# Patient Record
Sex: Female | Born: 1950 | Race: Black or African American | Hispanic: No | Marital: Single | Smoking: Never smoker
Health system: Southern US, Community
[De-identification: ages and names within clinical notes are randomized; demographics above are authoritative.]

## PROBLEM LIST (undated history)

## (undated) DIAGNOSIS — N189 Chronic kidney disease, unspecified: Secondary | ICD-10-CM

## (undated) DIAGNOSIS — T8859XA Other complications of anesthesia, initial encounter: Secondary | ICD-10-CM

## (undated) DIAGNOSIS — E119 Type 2 diabetes mellitus without complications: Secondary | ICD-10-CM

## (undated) DIAGNOSIS — I739 Peripheral vascular disease, unspecified: Secondary | ICD-10-CM

## (undated) DIAGNOSIS — E785 Hyperlipidemia, unspecified: Secondary | ICD-10-CM

## (undated) DIAGNOSIS — M1990 Unspecified osteoarthritis, unspecified site: Secondary | ICD-10-CM

## (undated) DIAGNOSIS — I484 Atypical atrial flutter: Secondary | ICD-10-CM

## (undated) DIAGNOSIS — I10 Essential (primary) hypertension: Secondary | ICD-10-CM

## (undated) HISTORY — PX: AV FISTULA PLACEMENT: SHX1204

## (undated) HISTORY — PX: LEG AMPUTATION ABOVE KNEE: SHX117

## (undated) HISTORY — DX: Atypical atrial flutter: I48.4

## (undated) HISTORY — PX: BELOW KNEE LEG AMPUTATION: SUR23

## (undated) HISTORY — PX: ABOVE KNEE LEG AMPUTATION: SUR20

## (undated) HISTORY — PX: BREAST BIOPSY: SHX20

## (undated) SURGERY — Surgical Case
Anesthesia: *Unknown

---

## 2002-03-04 DIAGNOSIS — E119 Type 2 diabetes mellitus without complications: Secondary | ICD-10-CM | POA: Insufficient documentation

## 2002-03-04 DIAGNOSIS — N186 End stage renal disease: Secondary | ICD-10-CM | POA: Insufficient documentation

## 2008-06-16 ENCOUNTER — Ambulatory Visit: Payer: Self-pay | Admitting: Nephrology

## 2009-05-20 ENCOUNTER — Ambulatory Visit: Payer: Self-pay | Admitting: Ophthalmology

## 2009-05-26 ENCOUNTER — Ambulatory Visit: Payer: Self-pay | Admitting: Ophthalmology

## 2009-05-31 ENCOUNTER — Ambulatory Visit: Payer: Self-pay | Admitting: Ophthalmology

## 2009-07-19 ENCOUNTER — Ambulatory Visit: Payer: Self-pay | Admitting: Internal Medicine

## 2009-08-09 ENCOUNTER — Ambulatory Visit: Payer: Self-pay | Admitting: Internal Medicine

## 2010-06-15 ENCOUNTER — Inpatient Hospital Stay: Payer: Self-pay | Admitting: Internal Medicine

## 2010-08-09 DIAGNOSIS — I214 Non-ST elevation (NSTEMI) myocardial infarction: Secondary | ICD-10-CM | POA: Insufficient documentation

## 2011-03-30 DIAGNOSIS — E6609 Other obesity due to excess calories: Secondary | ICD-10-CM | POA: Insufficient documentation

## 2011-07-26 ENCOUNTER — Emergency Department: Payer: Self-pay | Admitting: Unknown Physician Specialty

## 2011-07-26 LAB — COMPREHENSIVE METABOLIC PANEL
Alkaline Phosphatase: 124 U/L (ref 50–136)
Anion Gap: 13 (ref 7–16)
Bilirubin,Total: 0.4 mg/dL (ref 0.2–1.0)
Calcium, Total: 9.1 mg/dL (ref 8.5–10.1)
Co2: 27 mmol/L (ref 21–32)
Creatinine: 4.54 mg/dL — ABNORMAL HIGH (ref 0.60–1.30)
EGFR (Non-African Amer.): 10 — ABNORMAL LOW
Glucose: 191 mg/dL — ABNORMAL HIGH (ref 65–99)
Osmolality: 289 (ref 275–301)
SGOT(AST): 17 U/L (ref 15–37)
SGPT (ALT): 11 U/L — ABNORMAL LOW

## 2011-07-26 LAB — CBC
HCT: 42.3 % (ref 35.0–47.0)
MCH: 24.6 pg — ABNORMAL LOW (ref 26.0–34.0)
MCV: 78 fL — ABNORMAL LOW (ref 80–100)
Platelet: 201 10*3/uL (ref 150–440)
RBC: 5.46 10*6/uL — ABNORMAL HIGH (ref 3.80–5.20)
WBC: 10.8 10*3/uL (ref 3.6–11.0)

## 2011-07-26 LAB — LIPASE, BLOOD: Lipase: 129 U/L (ref 73–393)

## 2011-07-26 LAB — PROTIME-INR
INR: 1
Prothrombin Time: 13.4 secs (ref 11.5–14.7)

## 2011-12-28 ENCOUNTER — Emergency Department: Payer: Self-pay | Admitting: *Deleted

## 2011-12-28 LAB — URINALYSIS, COMPLETE
Bilirubin,UR: NEGATIVE
Glucose,UR: >=500 mg/dL (ref 0–75)
Protein: 100
RBC,UR: 77 /HPF (ref 0–5)
Specific Gravity: 1.01 (ref 1.003–1.030)
Squamous Epithelial: NONE SEEN
WBC UR: 1111 /HPF (ref 0–5)

## 2011-12-28 LAB — COMPREHENSIVE METABOLIC PANEL
Albumin: 2.7 g/dL — ABNORMAL LOW (ref 3.4–5.0)
Anion Gap: 10 (ref 7–16)
BUN: 28 mg/dL — ABNORMAL HIGH (ref 7–18)
Bilirubin,Total: 0.3 mg/dL (ref 0.2–1.0)
Calcium, Total: 8.9 mg/dL (ref 8.5–10.1)
Chloride: 101 mmol/L (ref 98–107)
Co2: 30 mmol/L (ref 21–32)
EGFR (African American): 9 — ABNORMAL LOW
EGFR (Non-African Amer.): 8 — ABNORMAL LOW
Glucose: 316 mg/dL — ABNORMAL HIGH (ref 65–99)
Osmolality: 299 (ref 275–301)
Potassium: 4.4 mmol/L (ref 3.5–5.1)
SGPT (ALT): 7 U/L — ABNORMAL LOW
Sodium: 141 mmol/L (ref 136–145)
Total Protein: 6.9 g/dL (ref 6.4–8.2)

## 2011-12-28 LAB — CBC
HCT: 33.7 % — ABNORMAL LOW (ref 35.0–47.0)
MCH: 24.6 pg — ABNORMAL LOW (ref 26.0–34.0)
MCV: 79 fL — ABNORMAL LOW (ref 80–100)
Platelet: 267 10*3/uL (ref 150–440)
RBC: 4.24 10*6/uL (ref 3.80–5.20)
RDW: 16.3 % — ABNORMAL HIGH (ref 11.5–14.5)

## 2011-12-28 LAB — CK TOTAL AND CKMB (NOT AT ARMC): CK, Total: 59 U/L (ref 21–215)

## 2012-04-19 ENCOUNTER — Other Ambulatory Visit: Payer: Self-pay

## 2012-04-19 LAB — POTASSIUM: Potassium: 5.9 mmol/L — ABNORMAL HIGH (ref 3.5–5.1)

## 2012-04-22 ENCOUNTER — Other Ambulatory Visit: Payer: Self-pay

## 2012-04-22 LAB — POTASSIUM: Potassium: 6.2 mmol/L — ABNORMAL HIGH (ref 3.5–5.1)

## 2012-04-24 ENCOUNTER — Other Ambulatory Visit: Payer: Self-pay

## 2012-04-24 LAB — POTASSIUM: Potassium: 5.4 mmol/L — ABNORMAL HIGH (ref 3.5–5.1)

## 2012-06-05 ENCOUNTER — Other Ambulatory Visit: Payer: Self-pay

## 2012-09-02 ENCOUNTER — Other Ambulatory Visit: Payer: Self-pay

## 2012-09-13 ENCOUNTER — Ambulatory Visit: Payer: Self-pay | Admitting: Vascular Surgery

## 2012-09-24 ENCOUNTER — Ambulatory Visit: Payer: Self-pay | Admitting: Vascular Surgery

## 2012-09-24 LAB — BASIC METABOLIC PANEL
Anion Gap: 10 (ref 7–16)
Calcium, Total: 8.3 mg/dL — ABNORMAL LOW (ref 8.5–10.1)
Co2: 29 mmol/L (ref 21–32)
Creatinine: 6.84 mg/dL — ABNORMAL HIGH (ref 0.60–1.30)
EGFR (African American): 7 — ABNORMAL LOW
Glucose: 164 mg/dL — ABNORMAL HIGH (ref 65–99)
Osmolality: 287 (ref 275–301)
Potassium: 4.2 mmol/L (ref 3.5–5.1)
Sodium: 136 mmol/L (ref 136–145)

## 2012-09-24 LAB — CBC
HCT: 32.3 % — ABNORMAL LOW (ref 35.0–47.0)
MCH: 26.3 pg (ref 26.0–34.0)
MCV: 80 fL (ref 80–100)
Platelet: 268 10*3/uL (ref 150–440)
RDW: 14.7 % — ABNORMAL HIGH (ref 11.5–14.5)
WBC: 9.5 10*3/uL (ref 3.6–11.0)

## 2012-09-27 ENCOUNTER — Ambulatory Visit: Payer: Self-pay | Admitting: Vascular Surgery

## 2012-10-29 ENCOUNTER — Ambulatory Visit: Payer: Self-pay | Admitting: Vascular Surgery

## 2012-10-29 LAB — BASIC METABOLIC PANEL
Calcium, Total: 8.3 mg/dL — ABNORMAL LOW (ref 8.5–10.1)
Creatinine: 6.67 mg/dL — ABNORMAL HIGH (ref 0.60–1.30)
EGFR (Non-African Amer.): 6 — ABNORMAL LOW
Osmolality: 287 (ref 275–301)
Sodium: 137 mmol/L (ref 136–145)

## 2012-12-03 ENCOUNTER — Ambulatory Visit: Payer: Self-pay | Admitting: Vascular Surgery

## 2012-12-03 LAB — CBC
HCT: 38 % (ref 35.0–47.0)
MCH: 26.4 pg (ref 26.0–34.0)
MCHC: 32.7 g/dL (ref 32.0–36.0)
MCV: 81 fL (ref 80–100)
Platelet: 193 10*3/uL (ref 150–440)
WBC: 8.4 10*3/uL (ref 3.6–11.0)

## 2012-12-03 LAB — BASIC METABOLIC PANEL
Anion Gap: 10 (ref 7–16)
BUN: 37 mg/dL — ABNORMAL HIGH (ref 7–18)
Calcium, Total: 8.4 mg/dL — ABNORMAL LOW (ref 8.5–10.1)
EGFR (African American): 7 — ABNORMAL LOW
EGFR (Non-African Amer.): 6 — ABNORMAL LOW
Potassium: 4.5 mmol/L (ref 3.5–5.1)
Sodium: 138 mmol/L (ref 136–145)

## 2012-12-13 ENCOUNTER — Ambulatory Visit: Payer: Self-pay | Admitting: Vascular Surgery

## 2013-09-22 DIAGNOSIS — E441 Mild protein-calorie malnutrition: Secondary | ICD-10-CM | POA: Insufficient documentation

## 2014-02-10 ENCOUNTER — Ambulatory Visit: Payer: Self-pay | Admitting: Family

## 2014-03-05 DIAGNOSIS — T879 Unspecified complications of amputation stump: Secondary | ICD-10-CM | POA: Insufficient documentation

## 2014-10-09 NOTE — Op Note (Signed)
PATIENT NAME:  Amy Wu, Amy Wu MR#:  [id number] DATE OF BIRTH:  [birth year]  DATE OF PROCEDURE:  09/13/2012  PREOPERATIVE DIAGNOSES:  1. Complication of arteriovenous dialysis device with thrombosis of left arm arteriovenous graft.  2. Superior vena cava syndrome.  3. End-stage renal disease, requiring hemodialysis.  4. Lack of venous access.  5. Diabetes.   POSTOPERATIVE DIAGNOSES:  1. Complication of arteriovenous dialysis device with thrombosis of left arm arteriovenous graft.  2. Superior vena cava syndrome.  3. End-stage renal disease, requiring hemodialysis.  4. Lack of venous access.  5. Diabetes.   PROCEDURES PERFORMED:  1. Introduction catheter into superior vena cava with jugular and central venography.  2. Insertion of left internal jugular cuffed tunneled dialysis catheter, Cannon type.   3. Ultrasound-guided insertion of intravenous access, right arm basilic vein.   PROCEDURE PERFORMED BY: Katha Cabal, MD   SEDATION: Versed 4 mg plus fentanyl 150 mcg administered IV. Continuous ECG, pulse oximetry and cardiopulmonary monitoring is performed throughout the entire procedure by the interventional radiology nurse. Total sedation time was 1 hour 20 minutes.   ACCESS: Left IJ.   FLUOROSCOPY TIME: 2.6 minutes.   CONTRAST USED: Isovue 5 mL.   INDICATIONS: The patient is a 64 year old woman maintained on hemodialysis, who has had difficulties throughout the week. Monday and Wednesday, dialysis was incomplete and fraught with problems. Today, she presented with thrombosis of her graft. She has significant elevation of her potassium and is in need of dialysis as she has technically missed several sessions, and therefore she is undergoing evaluation for catheter placement so that she can receive her dialysis, and her upper extremity access will be evaluated at a later date.   In addition, she is found to have significant lack of accessible veins for an IV, and ultimately, I am  asked to place a right arm venous access so that she can receive her fluids as well as her sedation. Risks and benefits were reviewed. All questions have been answered. The patient has agreed to proceed.   PROCEDURE: The patient is taken to special procedures and placed in the supine position, and her right arm is extended palm up. Her right arm is prepped, and then ultrasound is placed in a sterile sleeve. Ultrasound is utilized secondary to lack of appropriate landmarks and to avoid vascular injury. Scanning of the upper arm demonstrates a basilic vein which is fairly small and sclerotic in segments, but it is compressible and therefore patent. Under direct ultrasound visualization, after image is recorded, a micropuncture needle is inserted into the basilic vein. A microwire is advanced and followed by micro sheath. Micro sheath aspirates and flushes easily and is therefore connected to the IV fluids. The patient is then given conscious sedation as described above.   The left neck and chest wall are then prepped and draped in a sterile fashion. Ultrasound is placed in a new sterile sleeve. Jugular vein is identified. Micropuncture kit is utilized. Jugular vein is compressible and homogeneous, indicating patency. Image is recorded, and the micropuncture needle is utilized to access the vein. Microwire is then advanced; however, it curls in the more proximal jugular vein, suggesting a stenosis. Ultimately, the micro sheath is inserted, and hand injection of contrast is then utilized, demonstrating a moderate stenosis of the jugular vein at the confluence with the subclavian. Given the involvement of the subclavian, I am highly suspicious the reason her access has been failing is secondary to a central venous stenosis.   Having  identified the stenosis, the 0.018 wire is then negotiated beyond the stenosis and into the superior vena cava. Micro sheath is advanced completely. Subsequently, the wire and dilator  are removed, and an Amplatz Super Stiff wire is inserted. A 6 French sheath is inserted over the Amplatz wire, and KMP catheter is advanced. KMP catheter is used to interrogate the superior vena cava to ensure it is patent, which is confirmed, and subsequently, the KMP catheter is used to negotiate the Amplatz Super Stiff wire into the inferior vena cava.   Counterincision is made at the wire insertion site, a small pocket created with a hemostat, and then serial dilatation is performed. Dilator and peel-away sheath are advanced over the wire. A 23 cm tip to cuff Cannon catheter is selected. It is woven over the wire after the dilator is removed and advanced through the peel-away sheath under fluoroscopy. The peel-away sheath is removed. Wire is then removed. The catheter is approximated to the chest wall. Exit site is selected, anesthetized with 1% lidocaine. A small incision is created. Tunneling device is passed subcutaneously, and the tract is dilated. The catheter is then pulled subcutaneously and positioned with its tips in the mid atrium.   Catheter is transected, hub assembly is connected without difficulty. Both lumens aspirate and flush easily. They are then individually packed with 5000 units of heparin. The catheter is checked for smooth contour under fluoroscopy. Tips are in good position, and it is then secured to the chest wall with 0 silk suture. Neck counterincision is closed with 4-0 Monocryl subcuticular and then Dermabond. Sterile dressing is applied. The patient tolerated the procedure well, and there were no immediate complications. Sponge and needle counts were correct. She is taken to the recovery area in excellent condition.      ____________________________ Katha Cabal, MD ggs:OSi D: 09/13/2012 17:05:20 ET T: 09/14/2012 08:43:18 ET JOB#: [id number]  cc: Katha Cabal, MD, <Dictator> [facility] Katha Cabal MD ELECTRONICALLY SIGNED 09/17/2012 17:22

## 2014-10-09 NOTE — Op Note (Signed)
PATIENT NAME:  Amy Wu, Amy Wu MR#:  [id number] DATE OF BIRTH:  05-06-1951  DATE OF PROCEDURE:  12/13/2012  PREOPERATIVE DIAGNOSES:  1.  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  2.  Complication, arteriovenous graft.  3.  End-stage renal disease, requiring hemodialysis.   POSTOPERATIVE DIAGNOSES:  1.  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  2.  Complication, arteriovenous graft.  3.  End-stage renal disease, requiring hemodialysis.   PROCEDURE PERFORMED:  Ligation of right arm brachial axillary dialysis graft.   PROCEDURE PERFORMED BY:  Hortencia Pilar, MD   ANESTHESIA:  MAC.   ESTIMATED BLOOD LOSS:  Minimal.   SPECIMEN:  None.   INDICATIONS:  [name] is a 64 year old woman who presents with increasing pain in her right hand. She is status post successful AV graft placement. However, when given the option of a drill procedure, she has refused, and wishes to maintain a catheter as her sole mode of dialysis. The risks and benefits were reviewed, all questions answered, and the patient is therefore undergoing ligation to eliminate steal.   DESCRIPTION OF PROCEDURE:  The patient is taken to the operating room and placed in supine position. After adequate sedation is achieved, the right arm is prepped and draped in sterile fashion. Previous incisional scar is infiltrated with 0.5% Marcaine, and subsequently the incision is reopened with a 15 blade scalpel. The dissection is carried down, graft is exposed, dissected circumferentially quite easily, and then it is ligated with 3-0 silk ties. Wound is irrigated and then closed in layers with interrupted 3-0 Vicryl followed by 4-0 Monocryl subcuticular and Dermabond. The patient tolerated the procedure well, and there were no immediate complications. Sponge and needle counts were correct, and she is taken to the recovery area in excellent condition.     ____________________________ Katha Cabal,  MD ggs:mr D: 12/14/2012 10:35:48 ET T: 12/14/2012 18:51:45 ET JOB#: [id number]  cc: Katha Cabal, MD, <Dictator> Talbert Cage, [place] MD ELECTRONICALLY SIGNED 12/24/2012 14:29

## 2014-10-09 NOTE — Op Note (Signed)
PATIENT NAME:  Amy Wu, Amy Wu MR#:  [id number] DATE OF BIRTH:  [birth year]  DATE OF PROCEDURE:  10/29/2012  PREOPERATIVE DIAGNOSES: 1.  Ischemia, right hand.  2.  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  3.  End-stage renal disease requiring hemodialysis.   POSTOPERATIVE DIAGNOSES: 1.  Ischemia, right hand.  2.  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  3.  End-stage renal disease requiring hemodialysis.   PROCEDURES PERFORMED: 1.  Arch aortogram.  2.  Right upper extremity angiography, third order catheter placement.   SURGEON: Katha Cabal, M.D.   SEDATION: Versed 3 mg plus fentanyl 100 mcg administered IV. Continuous ECG, pulse oximetry and cardiopulmonary monitoring is performed throughout the entire procedure by the interventional radiology nurse. Total sedation time was 45 minutes.   ACCESS: 5-French sheath, left common femoral artery.   CONTRAST USED: Isovue 50 mL.     FLUOROSCOPY TIME: 2.2 minutes.   INDICATIONS: Ms. Hannasch is a 64 year old woman who is having increasing pain and loss of function in her hand. She notes this began after she had a right arm brachioaxillary dialysis graft placed. Duplex ultrasound suggested stricture stenosis of the brachial artery at the level of the anastomosis and she is undergoing angiography with the hope for intervention. The risks and benefits were reviewed. All questions were answered. The patient agrees to proceed.   DESCRIPTION OF PROCEDURE: The patient is taken to the special procedures suite and placed in the supine position. After adequate sedation is achieved, the groins are prepped and draped in sterile fashion. Ultrasound is placed in a sterile sleeve. Ultrasound is utilized secondary to lack of appropriate landmarks and to avoid vascular injury. Under direct ultrasound visualization, access is obtained to the common femoral artery under real-time visualization. Image is recorded for the  permanent record. Common femoral artery is homogeneous and pulsatile, indicating patency. Microwire followed by micro sheath, J-wire followed by 5-French sheath and 5-French pigtail catheter are then inserted. The pigtail catheter is positioned in the descending aorta and an LAO projection of the aortic arch is obtained. A stiff angled Glidewire and H1 catheter are exchanged for the pigtail catheter and the catheter is negotiated into the first  innominate and then the subclavian. Distal runoff is then obtained, initially  injecting from the catheter in the subclavian position and then reintroducing the wire and advancing the catheter into the mid brachial artery. After review of the images, the catheter is removed over wire and an oblique view of the left groin is obtained and a  Mynx  device is deployed for hemostasis. There are no immediate complications.   INTERPRETATION: The aortic arch is opacified with a bolus injection of contrast. There are no significant atherosclerotic changes noted or hemodynamically significant stenoses. The great vessels are widely patent at their ostia. It is a type II arch. The innominate, subclavian, axillary and brachial arteries are widely patent. The brachial artery at the level of the  AV anastomosis is widely patent with no irregularity whatsoever and the anastomosis itself is widely patent. Graft appears widely patent and fills the venous system without difficulty. There is no evidence of venous stenosis. The distal brachial artery also fills with injection from the midportion; however, as contrast enters the radial artery, diffuse disease is noted and significant reduction in the speed of flow. There is diffuse noncritical disease throughout. There is incomplete palmar arch noted. The interosseous artery is of moderate size, but again does not contribute significant collaterals distal to  the wrist. The ulnar artery is occluded throughout its entire course.   SUMMARY: The  patient appears to have rather profound distal disease at the small vessel level. This is not amenable to intervention. There does not appear to be any stricture stenosis surrounding the anastomotic area or the brachial artery itself and the  AV graft appears to be widely patent. In summary, the patient would likely benefit from a DRIL, although ligation of the AV graft will also be discussed.   ____________________________ Katha Cabal, MD ggs:jm D: 10/30/2012 17:55:55 ET T: 10/30/2012 20:25:44 ET JOB#: [id number]  cc: Katha Cabal, MD, <Dictator> Katha Cabal MD ELECTRONICALLY SIGNED 11/05/2012 9:28

## 2014-10-09 NOTE — Op Note (Signed)
PATIENT NAME:  Amy Wu, Amy Wu MR#:  [id number] DATE OF BIRTH:  Apr 04, 1951  DATE OF PROCEDURE:  09/27/2012  PREOPERATIVE DIAGNOSES: 1.  End-stage renal disease requiring hemodialysis.  2.  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  3.  Peripheral vascular disease, status post bilateral amputations.   POSTOPERATIVE DIAGNOSES: 1.  End-stage renal disease requiring hemodialysis.  2.  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  3.  Peripheral vascular disease, status post bilateral amputations.   PROCEDURE PERFORMED:  Creation of a right arm brachial axillary dialysis graft.   SURGEON:  Katha Cabal, M.D.   ANESTHESIA:  General by endotracheal intubation.   FLUIDS:  Per Anesthesia record.   ESTIMATED BLOOD LOSS:  Minimal.   SPECIMEN:  None.   INDICATIONS:  The patient is a 64 year old woman with multiple medical problems who is maintained on hemodialysis. She has been catheter-dependent and presented to the office for evaluation for upper extremity access. The risks and benefits were reviewed. All questions answered. The patient has agreed to proceed with creation of a right arm brachial axillary dialysis graft.   DESCRIPTION OF PROCEDURE:  The patient is taken to the Operating Room and placed in the supine position. After adequate general anesthesia is induced and appropriate invasive monitors are placed, she is positioned supine with her right arm extended palm upward. The right arm is then prepped and draped in a circumferential fashion. Appropriate time-out is called.   A linear incision is then created through previous scar of the right arm just above the antecubital fossa, and the dissection is carried down to identify the brachial veins. The brachial vein is dissected circumferentially and reflected superiorly exposing the brachial artery. The brachial artery is then dissected circumferentially and looped proximally and distally  with Silastic vessel loops.   In a similar fashion a linear incision is made along the anterior axillary line at the upper arm, and the dissection is carried down through the soft tissues to expose the axillary vein which is then looped proximally and distally with Silastic vessel loops.   Atrium tunneler is then used to create a tunnel subcutaneously and a 4 to 7 mm tapered Propaten graft is pulled through the tunnel.   The brachial artery is then controlled proximally and distally. Arteriotomy is made, and stay sutures of 6-0 Prolene are placed. The graft is beveled to an appropriate angle and an end graft to side brachial artery anastomosis is fashioned using running CV-6 suture. Flushing maneuvers are performed and flow was established back to the hand. Palpable radial pulse is noted. The graft is then flushed after it is pressurized to ensure that it is in good position and clamped just above the suture line. With the axillary vein in its native bed, the graft is approximated and then transected.   The vein is then controlled proximally and distally with the Silastic vessel loops. A venotomy is created, extended with Potts scissors, 6-0 Prolene stay sutures are placed, and an end graft to side vein anastomosis is fashioned with running CV-6 suture. Flushing maneuvers are performed and flow is established through the graft. Excellent thrill is noted. Radial pulse is maintained.   Both wounds are then irrigated with sterile saline and then closed in multiple layers using 3-0 Vicryl followed by 4-0 Monocryl subcuticular and then Dermabond. The patient tolerated the procedure well. There were no immediate complications. Sponge and needle counts are correct, and she is taken  to the recovery area in excellent condition.   ____________________________ Katha Cabal, MD ggs:jm D: 09/27/2012 18:03:17 ET T: 09/28/2012 12:18:32 ET JOB#: [id number]  cc: Katha Cabal, MD, <Dictator> Danelle Berry.  Derrek Monaco, [place] MD ELECTRONICALLY SIGNED 10/28/2012 13:44

## 2015-02-04 ENCOUNTER — Encounter: Admission: RE | Payer: Self-pay | Source: Ambulatory Visit

## 2015-02-04 ENCOUNTER — Ambulatory Visit
Admission: RE | Admit: 2015-02-04 | Payer: Medicare (Managed Care) | Source: Ambulatory Visit | Admitting: Vascular Surgery

## 2015-02-04 SURGERY — DIALYSIS/PERMA CATHETER INSERTION
Anesthesia: Moderate Sedation

## 2015-02-11 ENCOUNTER — Ambulatory Visit
Admission: RE | Admit: 2015-02-11 | Discharge: 2015-02-11 | Disposition: A | Discharge status: Home / Self Care | Payer: Medicare (Managed Care) | Source: Ambulatory Visit | Attending: Vascular Surgery | Admitting: Vascular Surgery

## 2015-02-11 ENCOUNTER — Encounter
Admission: RE | Disposition: A | Discharge status: Home / Self Care | Payer: Self-pay | Source: Ambulatory Visit | Attending: Vascular Surgery

## 2015-02-11 DIAGNOSIS — Z89611 Acquired absence of right leg above knee: Secondary | ICD-10-CM | POA: Insufficient documentation

## 2015-02-11 DIAGNOSIS — I739 Peripheral vascular disease, unspecified: Secondary | ICD-10-CM | POA: Insufficient documentation

## 2015-02-11 DIAGNOSIS — N186 End stage renal disease: Secondary | ICD-10-CM | POA: Insufficient documentation

## 2015-02-11 DIAGNOSIS — E785 Hyperlipidemia, unspecified: Secondary | ICD-10-CM | POA: Diagnosis not present

## 2015-02-11 DIAGNOSIS — Z992 Dependence on renal dialysis: Secondary | ICD-10-CM | POA: Diagnosis not present

## 2015-02-11 DIAGNOSIS — I120 Hypertensive chronic kidney disease with stage 5 chronic kidney disease or end stage renal disease: Secondary | ICD-10-CM | POA: Insufficient documentation

## 2015-02-11 DIAGNOSIS — E119 Type 2 diabetes mellitus without complications: Secondary | ICD-10-CM | POA: Diagnosis not present

## 2015-02-11 DIAGNOSIS — Z89512 Acquired absence of left leg below knee: Secondary | ICD-10-CM | POA: Diagnosis not present

## 2015-02-11 HISTORY — PX: PERIPHERAL VASCULAR CATHETERIZATION: SHX172C

## 2015-02-11 HISTORY — DX: Essential (primary) hypertension: I10

## 2015-02-11 HISTORY — DX: Chronic kidney disease, unspecified: N18.9

## 2015-02-11 HISTORY — DX: Peripheral vascular disease, unspecified: I73.9

## 2015-02-11 HISTORY — DX: Hyperlipidemia, unspecified: E78.5

## 2015-02-11 HISTORY — PX: EXCHANGE OF A DIALYSIS CATHETER: SHX5818

## 2015-02-11 HISTORY — DX: Type 2 diabetes mellitus without complications: E11.9

## 2015-02-11 LAB — POTASSIUM (ARMC VASCULAR LAB ONLY): POTASSIUM (ARMC VASCULAR LAB): 3.8

## 2015-02-11 SURGERY — DIALYSIS/PERMA CATHETER INSERTION
Anesthesia: Moderate Sedation

## 2015-02-11 MED ORDER — SODIUM CHLORIDE 0.9 % IV SOLN
INTRAVENOUS | Status: DC
  Administered 2015-02-11 (×2): via INTRAVENOUS

## 2015-02-11 MED ORDER — CEFAZOLIN SODIUM 1-5 GM-% IV SOLN
1.0000 g | Freq: Once | INTRAVENOUS | Status: AC
  Administered 2015-02-11: 1 g via INTRAVENOUS

## 2015-02-11 MED ORDER — CEFAZOLIN SODIUM 1-5 GM-% IV SOLN
INTRAVENOUS | Status: AC
  Filled 2015-02-11: qty 50

## 2015-02-11 MED ORDER — MIDAZOLAM HCL 2 MG/2ML IJ SOLN
INTRAMUSCULAR | Status: DC | PRN
  Administered 2015-02-11: 1 mg via INTRAVENOUS
  Administered 2015-02-11: 2 mg via INTRAVENOUS

## 2015-02-11 MED ORDER — FENTANYL CITRATE (PF) 100 MCG/2ML IJ SOLN
INTRAMUSCULAR | Status: AC
  Filled 2015-02-11: qty 2

## 2015-02-11 MED ORDER — MIDAZOLAM HCL 5 MG/5ML IJ SOLN
INTRAMUSCULAR | Status: AC
  Filled 2015-02-11: qty 5

## 2015-02-11 MED ORDER — FENTANYL CITRATE (PF) 100 MCG/2ML IJ SOLN
INTRAMUSCULAR | Status: DC | PRN
  Administered 2015-02-11 (×2): 50 ug via INTRAVENOUS

## 2015-02-11 MED ORDER — LIDOCAINE-EPINEPHRINE (PF) 1 %-1:200000 IJ SOLN
INTRAMUSCULAR | Status: AC
Start: 2015-02-11 — End: 2015-02-11
  Filled 2015-02-11: qty 30

## 2015-02-11 MED ORDER — HEPARIN SODIUM (PORCINE) 10000 UNIT/ML IJ SOLN
INTRAMUSCULAR | Status: AC
  Filled 2015-02-11: qty 1

## 2015-02-11 MED ORDER — HEPARIN (PORCINE) IN NACL 2-0.9 UNIT/ML-% IJ SOLN
INTRAMUSCULAR | Status: AC
  Filled 2015-02-11: qty 500

## 2015-02-11 SURGICAL SUPPLY — 3 items
CATH PALINDROME-P 23CM W/VT (CATHETERS) ×4 IMPLANT
GUIDEWIRE SUPER STIFF .035X180 (WIRE) ×4 IMPLANT
PACK ANGIOGRAPHY (CUSTOM PROCEDURE TRAY) ×4 IMPLANT

## 2015-02-11 NOTE — Op Note (Signed)
OPERATIVE NOTE    PRE-OPERATIVE DIAGNOSIS: 1. ESRD 2. Non-functional permcath  POST-OPERATIVE DIAGNOSIS: same as above  PROCEDURE: 1. Fluoroscopic guidance for placement of catheter 2. Placement of a 23 cm tip to cuff tunneled hemodialysis catheter via the left internal jugular vein and removal or previous catheter  SURGEON: DEW,JASON, MD  ANESTHESIA:  Local/MCS  ESTIMATED BLOOD LOSS: Minimal cc  FINDING(S): none  SPECIMEN(S):  None  INDICATIONS:   Patient is a 64 y.o.female who presents with non-functional dialysis catheter and ESRD.  The patient needs long term dialysis access for their ESRD, and a Permcath is necessary.  Risks and benefits are discussed and informed consent is obtained.    DESCRIPTION: After obtaining full informed written consent, the patient was brought back to the vascular suited. The patient's existing catheter, neck and chest were sterilely prepped and draped in a sterile surgical field was created.  The existing catheter was dissected free from the fibrous sheath securing the cuff with hemostats and blunt dissection.  A wire was placed. The existing catheter was then removed and the wire used to keep venous access. I selected a 23 cm tip to cuff tunneled dialysis catheter.  Using fluoroscopic guidance the catheter tips were parked in the right atrium. The appropriate distal connectors were placed. It withdrew blood well and flushed easily with heparinized saline and a concentrated heparin solution was then placed. It was secured to the chest wall with 2 Prolene sutures. A 4-0 Monocryl pursestring suture was placed around the exit site. Sterile dressings were placed. The patient tolerated the procedure well and was taken to the recovery room in stable condition.  COMPLICATIONS: None  CONDITION: Stable  DEW,JASON 02/11/2015 10:34 AM

## 2015-02-11 NOTE — H&P (Signed)
  [facility] History & Physical Update  The patient was interviewed and re-examined.  The patient's previous History and Physical has been reviewed and is unchanged.  There is no change in the plan of care. We plan to proceed with the scheduled procedure.  Trang Bouse, MD  02/11/2015, 9:31 AM

## 2015-02-11 NOTE — H&P (Signed)
[facility] Admission History & Physical  MRN : [id number]  Amy Wu is a 64 y.o. (Mar 18, 1951) female who presents with chief complaint of No chief complaint on file. Marland Kitchen  History of Present Illness: Patient with end-stage renal disease on dialysis who has been catheter dependent now for about 3 years. Worked reasonably well until the last couple of months when her flows have diminished and become more sluggish. Her dialysis center has requested that we exchanged this today. She is in her usual state of health and has no specific complaints today  Current Facility-Administered Medications  Medication Dose Route Frequency Provider Last Rate Last Dose  . 0.9 %  sodium chloride infusion   Intravenous Continuous Algernon Huxley, MD      . ceFAZolin (ANCEF) IVPB 1 g/50 mL premix  1 g Intravenous Once Algernon Huxley, MD   1 g at 02/11/15 1008  . fentaNYL (SUBLIMAZE) injection    PRN Algernon Huxley, MD   50 mcg at 02/11/15 1008  . midazolam (VERSED) injection    PRN Algernon Huxley, MD   2 mg at 02/11/15 1008    Past Medical History  Diagnosis Date  . Hypertension   . Diabetes mellitus without complication   . Peripheral vascular disease   . Chronic kidney disease   . Hyperlipidemia     Past Surgical History  Procedure Laterality Date  . Leg amputation above knee Right   . Below knee leg amputation Left     Social History Social History  Substance Use Topics  . Smoking status: Never Smoker   . Smokeless tobacco: None  . Alcohol Use: No  No IVDU  Family History No history of bleeding disorders, clotting disorders, autoimmune diseases, or aneurysms  Not on File   REVIEW OF SYSTEMS (Negative unless checked)  Constitutional: [] Weight loss  [] Fever  [] Chills Cardiac: [] Chest pain   [] Chest pressure   [] Palpitations   [] Shortness of breath when laying flat   [] Shortness of breath at rest   [] Shortness of breath with exertion. Vascular:  [] Pain in legs with walking    [] Pain in legs at rest   [] Pain in legs when laying flat   [] Claudication   [] Pain in feet when walking  [] Pain in feet at rest  [] Pain in feet when laying flat   [] History of DVT   [] Phlebitis   [] Swelling in legs   [] Varicose veins   [] Non-healing ulcers Pulmonary:   [] Uses home oxygen   [] Productive cough   [] Hemoptysis   [] Wheeze  [] COPD   [] Asthma Neurologic:  [] Dizziness  [] Blackouts   [] Seizures   [] History of stroke   [] History of TIA  [] Aphasia   [] Temporary blindness   [] Dysphagia   [] Weakness or numbness in arms   [] Weakness or numbness in legs Musculoskeletal:  [] Arthritis   [] Joint swelling   [] Joint pain   [] Low back pain Hematologic:  [] Easy bruising  [] Easy bleeding   [] Hypercoagulable state   [] Anemic  [] Hepatitis Gastrointestinal:  [] Blood in stool   [] Vomiting blood  [] Gastroesophageal reflux/heartburn   [] Difficulty swallowing. Genitourinary:  [x] Chronic kidney disease   [] Difficult urination  [] Frequent urination  [] Burning with urination   [] Blood in urine Skin:  [] Rashes   [] Ulcers   [] Wounds Psychological:  [] History of anxiety   []  History of major depression.  Physical Examination  Filed Vitals:   02/11/15 0923 02/11/15 0938  BP: 116/94   Pulse: 64   Temp: 98.7 F (  37.1 C)   TempSrc: Oral   Resp: 16   Height:  4' 9.5" (1.461 m)  Weight:  90.266 kg (199 lb)  SpO2: 96%    Body mass index is 42.29 kg/(m^2). Gen: WD/WN, NAD Head: [place]/AT, No temporalis wasting. Prominent temp pulse not noted. Ear/Nose/Throat: Hearing grossly intact, nares w/o erythema or drainage, oropharynx w/o Erythema/Exudate,  Eyes: PERRLA, EOMI.  Neck: Supple, no nuchal rigidity.  No bruit or JVD.  Pulmonary:  Good air movement, clear to auscultation bilaterally, no use of accessory muscles.  Cardiac: RRR, normal S1, S2, no Murmurs, rubs or gallops. Vascular: left jugular permcath in  place Vessel Right Left  Radial Palpable Palpable                                    Gastrointestinal: soft, non-tender/non-distended. No guarding/reflex.  Musculoskeletal: M/S 5/5 throughout.  Extremities without ischemic changes.  No deformity or atrophy.  Neurologic: CN 2-12 intact. Pain and light touch intact in extremities.  Symmetrical.  Speech is fluent. Motor exam as listed above. Psychiatric: Judgment intact, Mood & affect appropriate for pt's clinical situation. Dermatologic: No rashes or ulcers noted.  No cellulitis or open wounds. Lymph : No Cervical, Axillary, or Inguinal lymphadenopathy.     CBC Lab Results  Component Value Date   WBC 8.4 12/03/2012   HGB 12.4 12/03/2012   HCT 38.0 12/03/2012   MCV 81 12/03/2012   PLT 193 12/03/2012    BMET    Component Value Date/Time   NA 138 12/03/2012 1414   K 4.5 12/03/2012 1414   CL 101 12/03/2012 1414   CO2 27 12/03/2012 1414   GLUCOSE 326* 12/03/2012 1414   BUN 37* 12/03/2012 1414   CREATININE 7.04* 12/03/2012 1414   CALCIUM 8.4* 12/03/2012 1414   GFRNONAA 6* 12/03/2012 1414   GFRNONAA 10* 07/26/2011 1643   GFRAA 7* 12/03/2012 1414   GFRAA 13* 07/26/2011 1643   CrCl cannot be calculated (Patient has no serum creatinine result on file.).  COAG Lab Results  Component Value Date   INR 1.0 07/26/2011    Radiology No results found.   Assessment/Plan 1. ESRD.  On HD M/W/F.  Using catheter long term 2. Dysfunction of dialysis access.  Old catheter with sluggish flow.  Dialysis center has asked for exchange today. 3. HTN: stable.  Continue outpatient meds 4. DM: stable.  Continue outpatient meds 5. PAD: s/p BLE amputations.  stable   DEW,JASON, MD  02/11/2015 10:08 AM

## 2015-03-15 ENCOUNTER — Other Ambulatory Visit: Payer: Self-pay | Admitting: Vascular Surgery

## 2015-03-16 ENCOUNTER — Encounter: Payer: Self-pay | Admitting: *Deleted

## 2015-03-16 ENCOUNTER — Encounter
Admission: RE | Disposition: A | Discharge status: Home / Self Care | Payer: Self-pay | Source: Ambulatory Visit | Attending: Vascular Surgery

## 2015-03-16 ENCOUNTER — Ambulatory Visit
Admission: RE | Admit: 2015-03-16 | Discharge: 2015-03-16 | Disposition: A | Discharge status: Home / Self Care | Payer: Medicare (Managed Care) | Source: Ambulatory Visit | Attending: Vascular Surgery | Admitting: Vascular Surgery

## 2015-03-16 DIAGNOSIS — N186 End stage renal disease: Secondary | ICD-10-CM | POA: Insufficient documentation

## 2015-03-16 LAB — GLUCOSE, CAPILLARY: Glucose-Capillary: 120 mg/dL — ABNORMAL HIGH (ref 65–99)

## 2015-03-16 SURGERY — THROMBECTOMY
Anesthesia: Moderate Sedation | Laterality: Right

## 2015-03-16 MED ORDER — SODIUM CHLORIDE 0.9 % IJ SOLN
INTRAMUSCULAR | Status: AC
  Filled 2015-03-16: qty 6

## 2015-03-16 MED ORDER — SODIUM CHLORIDE 0.9 % IV SOLN
5.0000 mg | Freq: Once | INTRAVENOUS | Status: AC
  Administered 2015-03-16: 5 mg via INTRAVENOUS
  Filled 2015-03-16: qty 5

## 2015-03-16 MED ORDER — INSULIN ASPART 100 UNIT/ML ~~LOC~~ SOLN
0.0000 [IU] | SUBCUTANEOUS | Status: DC
Start: 2015-03-16 — End: 2015-03-16

## 2015-03-16 MED ORDER — DEXTROSE 5 % IV SOLN: 1.5000 g | INTRAVENOUS | Status: DC

## 2015-03-16 MED ORDER — SODIUM CHLORIDE 0.9 % IV SOLN
5.0000 mg | Freq: Once | INTRAVENOUS | Status: DC
  Filled 2015-03-16 (×2): qty 5

## 2015-03-16 MED ORDER — HEPARIN SODIUM (PORCINE) 5000 UNIT/ML IJ SOLN
INTRAMUSCULAR | Status: AC
  Filled 2015-03-16: qty 1

## 2015-03-16 MED ORDER — HEPARIN SODIUM (PORCINE) 5000 UNIT/ML IJ SOLN
INTRAMUSCULAR | Status: AC
  Filled 2015-03-16: qty 2

## 2015-03-16 MED ORDER — SODIUM CHLORIDE 0.9 % IV SOLN
5.0000 mg | Freq: Once | INTRAVENOUS | Status: DC
  Administered 2015-03-16: 5 mg via INTRAVENOUS

## 2015-03-16 MED ORDER — CHLORHEXIDINE GLUCONATE CLOTH 2 % EX PADS: 6.0000 | MEDICATED_PAD | Freq: Once | CUTANEOUS | Status: DC

## 2015-03-16 MED ORDER — SODIUM CHLORIDE 0.9 % IV SOLN: INTRAVENOUS | Status: DC

## 2015-03-16 NOTE — OR Nursing (Signed)
TPA obtain as ordered and started at 10AM via each pots of dialsis catheter in attempt to declot catheter as ordered

## 2015-03-16 NOTE — Discharge Instructions (Signed)

## 2015-03-20 NOTE — H&P (Addendum)
[facility] Admission History & Physical  MRN : [id number]  Amy Wu is a 64 y.o. (1950-09-20) female who presents with chief complaint of clotted dialysis access right arm.  History of Present Illness: The patient is sent for declot of her dialysis access. Dialysis center called stating it was urgent also stating rest the right arm. Of note patient has catheter-based dialysis  No current facility-administered medications for this encounter.   Current Outpatient Prescriptions  Medication Sig Dispense Refill  . aspirin 81 MG tablet Take 81 mg by mouth daily.    Marland Kitchen atorvastatin (LIPITOR) 40 MG tablet Take 40 mg by mouth daily.    . cholecalciferol (VITAMIN D) 1000 UNITS tablet Take 2,000 Units by mouth daily.      Past Medical History  Diagnosis Date  . Hypertension   . Diabetes mellitus without complication   . Peripheral vascular disease   . Chronic kidney disease   . Hyperlipidemia     Past Surgical History  Procedure Laterality Date  . Leg amputation above knee Right   . Below knee leg amputation Left   . Peripheral vascular catheterization N/A 02/11/2015    Procedure: Dialysis/Perma Catheter Insertion;  Surgeon: Algernon Huxley, MD;  Location: [facility];  Service: Cardiovascular;  Laterality: N/A;  . Exchange of a dialysis catheter  02/11/2015    Procedure: Exchange Of A Dialysis Catheter;  Surgeon: Algernon Huxley, MD;  Location: [facility];  Service: Cardiovascular;;  . Av fistula placement Bilateral     Social History Social History  Substance Use Topics  . Smoking status: Never Smoker   . Smokeless tobacco: Not on file  . Alcohol Use: No    Family History No family history on file. no family history of porphyria, autoimmune disease or bleeding clotting disorders  No Known Allergies   REVIEW OF SYSTEMS (Negative unless checked)  Constitutional: [] Weight loss  [] Fever  [] Chills Cardiac: [] Chest pain   [] Chest  pressure   [] Palpitations   [] Shortness of breath when laying flat   [] Shortness of breath at rest   [] Shortness of breath with exertion. Vascular:  [] Pain in legs with walking   [] Pain in legs at rest   [] Pain in legs when laying flat   [] Claudication   [] Pain in feet when walking  [] Pain in feet at rest  [] Pain in feet when laying flat   [] History of DVT   [] Phlebitis   [] Swelling in legs   [] Varicose veins   [] Non-healing ulcers Pulmonary:   [] Uses home oxygen   [] Productive cough   [] Hemoptysis   [] Wheeze  [] COPD   [] Asthma Neurologic:  [] Dizziness  [] Blackouts   [] Seizures   [] History of stroke   [] History of TIA  [] Aphasia   [] Temporary blindness   [] Dysphagia   [] Weakness or numbness in arms   [] Weakness or numbness in legs Musculoskeletal:  [] Arthritis   [] Joint swelling   [] Joint pain   [] Low back pain Hematologic:  [] Easy bruising  [] Easy bleeding   [] Hypercoagulable state   [] Anemic  [] Hepatitis Gastrointestinal:  [] Blood in stool   [] Vomiting blood  [] Gastroesophageal reflux/heartburn   [] Difficulty swallowing. Genitourinary:  [] Chronic kidney disease   [] Difficult urination  [] Frequent urination  [] Burning with urination   [] Blood in urine Skin:  [] Rashes   [] Ulcers   [] Wounds Psychological:  [] History of anxiety   []  History of major depression.  Physical Examination  Filed Vitals:   03/16/15 0850  BP: 164/87  Pulse: 66  Temp: 98.6 F (37 C)  Height: 4\' 9"  (1.448 m)  Weight: 90.266 kg (199 lb)  SpO2: 100%   Body mass index is 43.05 kg/(m^2). Gen: WD/WN, NAD Head: /AT, No temporalis wasting.  Ear/Nose/Throat: Hearing grossly intact, nares w/o erythema or drainage, oropharynx w/o Erythema/Exudate, Eyes: PERRLA, EOMI.  Neck: Supple, no nuchal rigidity.  No bruit or JVD.  Pulmonary:  Good air movement, clear to auscultation bilaterally, no increased work of respiration or use of accessory muscles  Cardiac: RRR, normal S1, S2, no Murmurs, rubs or gallops. Vascular: Patient  has scars consistent with multiple past access placements of both upper extremities. There is no functioning access. There is no access that has been recently used that is now thrombosed. She has a right IJ catheter that is been used for 3 years it was recently exchanged but does not appear to be functioning well at this time Gastrointestinal: soft, non-tender/non-distended. No guarding/reflex. No masses, surgical incisions, or scars. Musculoskeletal: M/S 5/5 throughout.  No deformity or atrophy. Neurologic: CN 2-12 intact. Pain and light touch intact in extremities.  Symmetrical.  Speech is fluent. Motor exam as listed above. Psychiatric: Judgment intact, Mood & affect appropriate for pt's clinical situation. Dermatologic: No rashes or ulcers noted.  No cellulitis or open wounds. Lymph : No Cervical, Axillary, or Inguinal lymphadenopathy.   CBC Lab Results  Component Value Date   WBC 8.4 12/03/2012   HGB 12.4 12/03/2012   HCT 38.0 12/03/2012   MCV 81 12/03/2012   PLT 193 12/03/2012    BMET    Component Value Date/Time   NA 138 12/03/2012 1414   K 4.5 12/03/2012 1414   CL 101 12/03/2012 1414   CO2 27 12/03/2012 1414   GLUCOSE 326* 12/03/2012 1414   BUN 37* 12/03/2012 1414   CREATININE 7.04* 12/03/2012 1414   CALCIUM 8.4* 12/03/2012 1414   GFRNONAA 6* 12/03/2012 1414   GFRNONAA 10* 07/26/2011 1643   GFRAA 7* 12/03/2012 1414   GFRAA 13* 07/26/2011 1643   CrCl cannot be calculated (Patient has no serum creatinine result on file.).  COAG Lab Results  Component Value Date   INR 1.0 07/26/2011    Radiology No results found.   Assessment/Plan Patient with complication of dialysis device and nonfunction of her catheter. She does not have an upper extremity access. This was an inappropriate communication from the dialysis center. However it has been determined on evaluation of her catheter that is not flowing well and therefore a three-hour TPA infusion will be performed for  catheter clearance. This will allow for continued use of her dialysis access and maintenance of her life saving dialysis therapy.  The risks and benefits are reviewed all questions answered patient agrees to proceed with attempts at catheter salvage   Schnier, Dolores Lory, MD  03/20/2015 12:35 PM

## 2015-03-20 NOTE — Op Note (Signed)
OPERATIVE NOTE   PROCEDURE: 1. Infusion of TPA for clearance of tunneled dialysis catheter  PRE-OPERATIVE DIAGNOSIS: Complication of tunneled dialysis catheter with poor flow, and stage renal disease requiring hemodialysis  POST-OPERATIVE DIAGNOSIS: Same  SURGEON: Katha Cabal, MD  ANESTHESIA: None  ESTIMATED BLOOD LOSS: Minimal cc  FINDING(S): 1. Attempts at aspirating both lumens are unsuccessful prior to infusion. This suggests poor flow and inadequate situation for continued dialysis.  SPECIMEN(S):  None  INDICATIONS:   Amy Wu is a 65 y.o. female who presents with a poorly functioning tunneled dialysis catheter.  DESCRIPTION: After obtaining full informed written consent, the patient was brought to special procedures holding area and positioned supine on a gurney.   Under sterile technique were catheter function is verified. 2 mg of alteplase is reconstituted in 50 cc a total of 2 aliquots are made. Subtotally infusion through both lumens of the dialysis catheter is performed over 2-1/2 hour time period. Infusion is simultaneous.  Following completion of the infusion again using sterile technique both lumens are aspirated and suctioned with flushed with sterile saline. Aspiration demonstrates excellent flow from both lumens. Both lumens are then packed with a total of 5000 units of heparin per lumen reconstituted in the indicated volume based on the patient's catheter. Sterile dressing is reapplied..  COMPLICATIONS: None  CONDITION: Good  Katha Cabal M.D. [facility] Office: [phone]   03/20/2015, 12:41 PM

## 2015-05-10 ENCOUNTER — Other Ambulatory Visit: Payer: Self-pay | Admitting: Vascular Surgery

## 2015-05-10 ENCOUNTER — Encounter: Payer: Self-pay | Admitting: Vascular Surgery

## 2015-05-10 ENCOUNTER — Ambulatory Visit
Admission: RE | Admit: 2015-05-10 | Discharge: 2015-05-10 | Disposition: A | Discharge status: Home / Self Care | Payer: Medicare (Managed Care) | Source: Ambulatory Visit | Attending: Vascular Surgery | Admitting: Vascular Surgery

## 2015-05-10 DIAGNOSIS — Z992 Dependence on renal dialysis: Secondary | ICD-10-CM | POA: Diagnosis not present

## 2015-05-10 DIAGNOSIS — N186 End stage renal disease: Secondary | ICD-10-CM | POA: Insufficient documentation

## 2015-05-10 SURGERY — Surgical Case
Anesthesia: *Unknown

## 2015-05-10 MED ORDER — SODIUM CHLORIDE 0.9 % IV SOLN
5.0000 mg | Freq: Once | INTRAVENOUS | Status: AC
  Administered 2015-05-10: 5 mg via INTRAVENOUS
  Filled 2015-05-10: qty 5

## 2015-05-10 MED ORDER — DIAZEPAM 5 MG PO TABS
5.0000 mg | ORAL_TABLET | Freq: Once | ORAL | Status: AC
  Administered 2015-05-10: 5 mg via ORAL
  Filled 2015-05-10: qty 1

## 2015-05-10 MED ORDER — SODIUM CHLORIDE 0.9 % IV SOLN: INTRAVENOUS | Status: DC

## 2015-05-10 MED ORDER — INSULIN ASPART 100 UNIT/ML ~~LOC~~ SOLN: 0.0000 [IU] | SUBCUTANEOUS | Status: DC

## 2015-05-10 NOTE — Op Note (Signed)
[facility]   OPERATIVE NOTE   PROCEDURE: 1. Continuous infusion of TPA for nonfunctional tunneled hemodialysis catheter  PRE-OPERATIVE DIAGNOSIS: 1. ESRD 2. Nonfunctional tunneled hemodialysis catheter  POST-OPERATIVE DIAGNOSIS: 1. ESRD 2. Nonfunctional tunneled hemodialysis catheter  SURGEON: [name], MD  ASSISTANT(S): None  ANESTHESIA: None  ESTIMATED BLOOD LOSS: Minimal  FINDING(S): 1.  Catheters withdrew blood well and flushed easily after infusion  SPECIMEN(S):  None  INDICATIONS:   Amy Wu is a 64 y.o. female who presents with a nonfunctional tunneled dialysis catheter.  We are performing a TPA infusion to try to open the catheter and salvage this for use.  Risks and benefits were discussed.  DESCRIPTION: After obtaining full informed written consent, the patient is brought to the vascular and interventional radiology area. 2 mg of alteplase is infused over a two-hour period through both lumens of the tunneled hemodialysis catheter. At the conclusion of the infusion, we sterilely withdrew blood and flushed easily with sterile saline in both lumens. A concentrated heparin solution was then placed in both lumens and sterile caps were placed. The patient tolerated the procedure well.  COMPLICATIONS: None  CONDITION: Stable  Amy Wu  05/10/2015, 5:08 PM  Continu

## 2015-05-10 NOTE — OR Nursing (Signed)
15:20 TPA infused and saline hung. Dr. Lucky Cowboy notified to check the line and flush.

## 2015-05-11 ENCOUNTER — Ambulatory Visit
Admission: RE | Admit: 2015-05-11 | Discharge: 2015-05-11 | Disposition: A | Discharge status: Home / Self Care | Payer: Medicare (Managed Care) | Source: Ambulatory Visit | Attending: Vascular Surgery | Admitting: Vascular Surgery

## 2015-05-11 ENCOUNTER — Other Ambulatory Visit: Payer: Self-pay | Admitting: Vascular Surgery

## 2015-05-11 ENCOUNTER — Encounter
Admission: RE | Disposition: A | Discharge status: Home / Self Care | Payer: Self-pay | Source: Ambulatory Visit | Attending: Vascular Surgery

## 2015-05-11 DIAGNOSIS — Z992 Dependence on renal dialysis: Secondary | ICD-10-CM | POA: Diagnosis not present

## 2015-05-11 DIAGNOSIS — E1122 Type 2 diabetes mellitus with diabetic chronic kidney disease: Secondary | ICD-10-CM | POA: Insufficient documentation

## 2015-05-11 DIAGNOSIS — I120 Hypertensive chronic kidney disease with stage 5 chronic kidney disease or end stage renal disease: Secondary | ICD-10-CM | POA: Insufficient documentation

## 2015-05-11 DIAGNOSIS — Y832 Surgical operation with anastomosis, bypass or graft as the cause of abnormal reaction of the patient, or of later complication, without mention of misadventure at the time of the procedure: Secondary | ICD-10-CM | POA: Diagnosis not present

## 2015-05-11 DIAGNOSIS — I739 Peripheral vascular disease, unspecified: Secondary | ICD-10-CM | POA: Insufficient documentation

## 2015-05-11 DIAGNOSIS — E785 Hyperlipidemia, unspecified: Secondary | ICD-10-CM | POA: Diagnosis not present

## 2015-05-11 DIAGNOSIS — T829XXA Unspecified complication of cardiac and vascular prosthetic device, implant and graft, initial encounter: Secondary | ICD-10-CM | POA: Diagnosis not present

## 2015-05-11 DIAGNOSIS — Z79899 Other long term (current) drug therapy: Secondary | ICD-10-CM | POA: Insufficient documentation

## 2015-05-11 DIAGNOSIS — Z794 Long term (current) use of insulin: Secondary | ICD-10-CM | POA: Insufficient documentation

## 2015-05-11 DIAGNOSIS — N186 End stage renal disease: Secondary | ICD-10-CM | POA: Diagnosis not present

## 2015-05-11 HISTORY — PX: PERIPHERAL VASCULAR CATHETERIZATION: SHX172C

## 2015-05-11 LAB — POTASSIUM (ARMC VASCULAR LAB ONLY): POTASSIUM (ARMC VASCULAR LAB): 4.8 (ref 3.5–5.1)

## 2015-05-11 SURGERY — DIALYSIS/PERMA CATHETER INSERTION
Anesthesia: Moderate Sedation

## 2015-05-11 MED ORDER — HEPARIN (PORCINE) IN NACL 2-0.9 UNIT/ML-% IJ SOLN
INTRAMUSCULAR | Status: AC
  Filled 2015-05-11: qty 500

## 2015-05-11 MED ORDER — FENTANYL CITRATE (PF) 100 MCG/2ML IJ SOLN
INTRAMUSCULAR | Status: AC
  Filled 2015-05-11: qty 2

## 2015-05-11 MED ORDER — MIDAZOLAM HCL 2 MG/2ML IJ SOLN
INTRAMUSCULAR | Status: AC
  Filled 2015-05-11: qty 2

## 2015-05-11 MED ORDER — LIDOCAINE-EPINEPHRINE (PF) 1 %-1:200000 IJ SOLN
INTRAMUSCULAR | Status: AC
  Filled 2015-05-11: qty 30

## 2015-05-11 MED ORDER — MIDAZOLAM HCL 2 MG/2ML IJ SOLN
INTRAMUSCULAR | Status: DC | PRN
  Administered 2015-05-11: 1 mg via INTRAVENOUS
  Administered 2015-05-11: 2 mg via INTRAVENOUS

## 2015-05-11 MED ORDER — SODIUM CHLORIDE 0.9 % IV SOLN
INTRAVENOUS | Status: DC
  Administered 2015-05-11: 13:00:00 via INTRAVENOUS

## 2015-05-11 MED ORDER — INSULIN ASPART 100 UNIT/ML ~~LOC~~ SOLN: 0.0000 [IU] | SUBCUTANEOUS | Status: DC

## 2015-05-11 MED ORDER — HEPARIN SODIUM (PORCINE) 10000 UNIT/ML IJ SOLN
INTRAMUSCULAR | Status: AC
  Filled 2015-05-11: qty 1

## 2015-05-11 MED ORDER — DEXTROSE 5 % IV SOLN
1.5000 g | INTRAVENOUS | Status: AC
  Administered 2015-05-11: 1.5 g via INTRAVENOUS
  Filled 2015-05-11: qty 1.5

## 2015-05-11 MED ORDER — FENTANYL CITRATE (PF) 100 MCG/2ML IJ SOLN
INTRAMUSCULAR | Status: DC | PRN
  Administered 2015-05-11 (×2): 50 ug via INTRAVENOUS

## 2015-05-11 SURGICAL SUPPLY — 8 items
BIOPATCH WHT 1IN DISK W/4.0 H (GAUZE/BANDAGES/DRESSINGS) ×3 IMPLANT
CATH PALINDROME-P 19CM W/VT (CATHETERS) ×3 IMPLANT
GUIDEWIRE SUPER STIFF .035X180 (WIRE) ×3 IMPLANT
PACK ANGIOGRAPHY (CUSTOM PROCEDURE TRAY) ×3 IMPLANT
PREP CHG 10.5 TEAL (MISCELLANEOUS) ×3 IMPLANT
SUT MNCRL AB 4-0 PS2 18 (SUTURE) ×3 IMPLANT
SUT SILK 0 FSL (SUTURE) ×3 IMPLANT
TOWEL OR 17X26 4PK STRL BLUE (TOWEL DISPOSABLE) ×3 IMPLANT

## 2015-05-11 NOTE — Discharge Instructions (Signed)

## 2015-05-11 NOTE — Op Note (Signed)
OPERATIVE NOTE   PROCEDURE: 1. Insertion of tunneled dialysis catheter left internal jugular approach same venous access.  PRE-OPERATIVE DIAGNOSIS: Complication of dialysis catheter with poor flow  POST-OPERATIVE DIAGNOSIS: Same  SURGEON: Massimo Hartland, Dolores Lory  ANESTHESIA: Conscious sedation with 1% lidocaine local infiltration  ESTIMATED BLOOD LOSS: Minimal cc  CONTRAST USED:  None  FLUOROSCOPY TIME:    INDICATIONS:   Amy Wu is a 64 y.o.y.o. female who presents with poor flow and nonfunction of the tunneled dialysis catheter.  Adequate dialysis has not been possible.  DESCRIPTION: After obtaining full informed written consent, the patient was positioned supine. The left neck and chest wall was prepped and draped in a sterile fashion. The cuff is localized and using blunt and sharp dissection it is freed from the surrounding adhesions.  The existing catheter is then transected proximal to the cuff.  The guidewire is advanced without difficulty under fluoroscopy.  Dilators are passed over the wire as needed and the tunneled dialysis catheter is fed into the central venous system without difficulty.  Under fluoroscopy the catheter tip positioned at the atrial caval junction.  Both lumens aspirate and flush easily. After verification of smooth contour with proper tip position under fluoroscopy the catheter is packed with 5000 units of heparin per lumen.  Catheter secured to the skin of the chest with 0 silk. A sterile dressing is applied with a Biopatch.  COMPLICATIONS: None  CONDITION: Good  [name], Dolores Lory [facility]. Office:  [phone]   05/11/2015,1:03 PM

## 2015-05-11 NOTE — H&P (Signed)
[facility] Admission History & Physical  MRN : [id number]  Amy Wu is a 64 y.o. (September 25, 1950) female who presents with chief complaint of catheter malfunction.  History of Present Illness: Patient was in dialysis today and they were unable to get flows of greater than 115. This is not adequate for dialysis. Dr. Clayborne Artist contact me by phone asking if the catheter can be exchange. She recently had a TPA infusion which seemed to be successful with good aspiration at the conclusion.  Patient denies fever chills. Catheter is nontender and there is no drainage per the patient  Current Facility-Administered Medications  Medication Dose Route Frequency Provider Last Rate Last Dose  . 0.9 %  sodium chloride infusion   Intravenous Continuous Kimberly A Stegmayer, PA-C      . cefUROXime (ZINACEF) 1.5 g in dextrose 5 % 50 mL IVPB  1.5 g Intravenous 30 min Pre-Op Kimberly A Stegmayer, PA-C      . insulin aspart (novoLOG) injection 0-24 Units  0-24 Units Subcutaneous 6 times per day Sela Hua, PA-C        Past Medical History  Diagnosis Date  . Hypertension   . Diabetes mellitus without complication ([place])   . Peripheral vascular disease ([place])   . Chronic kidney disease   . Hyperlipidemia     Past Surgical History  Procedure Laterality Date  . Leg amputation above knee Right   . Below knee leg amputation Left   . Peripheral vascular catheterization N/A 02/11/2015    Procedure: Dialysis/Perma Catheter Insertion;  Surgeon: Algernon Huxley, MD;  Location: [facility];  Service: Cardiovascular;  Laterality: N/A;  . Exchange of a dialysis catheter  02/11/2015    Procedure: Exchange Of A Dialysis Catheter;  Surgeon: Algernon Huxley, MD;  Location: [facility];  Service: Cardiovascular;;  . Av fistula placement Bilateral     Social History Social History  Substance Use Topics  . Smoking status: Never Smoker   . Smokeless tobacco: Not on file  .  Alcohol Use: No    Family History There is no family history of porphyria, autoimmune disease or leading clotting disorders  No Known Allergies   REVIEW OF SYSTEMS (Negative unless checked)  Constitutional: [] Weight loss  [] Fever  [] Chills Cardiac: [] Chest pain   [] Chest pressure   [] Palpitations   [] Shortness of breath when laying flat   [] Shortness of breath at rest   [] Shortness of breath with exertion. Vascular:  [] Pain in legs with walking   [] Pain in legs at rest   [] Pain in legs when laying flat   [] Claudication   [] Pain in feet when walking  [] Pain in feet at rest  [] Pain in feet when laying flat   [] History of DVT   [] Phlebitis   [] Swelling in legs   [] Varicose veins   [] Non-healing ulcers Pulmonary:   [] Uses home oxygen   [] Productive cough   [] Hemoptysis   [] Wheeze  [] COPD   [] Asthma Neurologic:  [] Dizziness  [] Blackouts   [] Seizures   [] History of stroke   [] History of TIA  [] Aphasia   [] Temporary blindness   [] Dysphagia   [] Weakness or numbness in arms   [] Weakness or numbness in legs Musculoskeletal:  [] Arthritis   [] Joint swelling   [] Joint pain   [] Low back pain Hematologic:  [] Easy bruising  [] Easy bleeding   [] Hypercoagulable state   [] Anemic  [] Hepatitis Gastrointestinal:  [] Blood in stool   [] Vomiting blood  [] Gastroesophageal reflux/heartburn   []   Difficulty swallowing. Genitourinary:  [] Chronic kidney disease   [] Difficult urination  [] Frequent urination  [] Burning with urination   [] Blood in urine Skin:  [] Rashes   [] Ulcers   [] Wounds Psychological:  [] History of anxiety   []  History of major depression.  Physical Examination  Filed Vitals:   05/11/15 1059 05/11/15 1113  BP: 153/122 127/50  Pulse: 82 82  Temp: 98.4 F (36.9 C)   TempSrc: Oral   Resp: 20 30  Height: 4\' 9"  (1.448 m)   Weight: 88.905 kg (196 lb)   SpO2: 100% 100%   Body mass index is 42.4 kg/(m^2). Gen: WD/WN, NAD Head: [place]/AT, No temporalis wasting.  Ear/Nose/Throat: Hearing grossly intact,  nares w/o erythema or drainage, oropharynx w/o Erythema/Exudate, Eyes: PERRLA, EOMI.  Neck: Supple, no nuchal rigidity.  No bruit or JVD.  Pulmonary:  Good air movement, clear to auscultation bilaterally, no increased work of respiration or use of accessory muscles  Cardiac: RRR, normal S1, S2, no Murmurs, rubs or gallops. Vascular: Catheter is intact there is no redness erythema there is no tenderness there is no drainage Vessel Right Left  Radial Palpable Palpable  Ulnar Palpable Palpable  Brachial Palpable Palpable  Carotid Palpable, without bruit Palpable, without bruit  Aorta Not palpable N/A  Femoral Palpable Palpable  Popliteal Palpable Palpable  PT Palpable Palpable  DP Palpable Palpable   Gastrointestinal: soft, non-tender/non-distended. No guarding/reflex. No masses, surgical incisions, or scars. Musculoskeletal: M/S 5/5 throughout.  No deformity or atrophy. Neurologic: CN 2-12 intact. Pain and light touch intact in extremities.  Symmetrical.  Speech is fluent. Motor exam as listed above. Psychiatric: Judgment intact, Mood & affect appropriate for pt's clinical situation. Dermatologic: No rashes or ulcers noted.  No cellulitis or open wounds. Lymph : No Cervical, Axillary, or Inguinal lymphadenopathy.    CBC Lab Results  Component Value Date   WBC 8.4 12/03/2012   HGB 12.4 12/03/2012   HCT 38.0 12/03/2012   MCV 81 12/03/2012   PLT 193 12/03/2012    BMET    Component Value Date/Time   NA 138 12/03/2012 1414   K 4.5 12/03/2012 1414   CL 101 12/03/2012 1414   CO2 27 12/03/2012 1414   GLUCOSE 326* 12/03/2012 1414   BUN 37* 12/03/2012 1414   CREATININE 7.04* 12/03/2012 1414   CALCIUM 8.4* 12/03/2012 1414   GFRNONAA 6* 12/03/2012 1414   GFRNONAA 10* 07/26/2011 1643   GFRAA 7* 12/03/2012 1414   GFRAA 13* 07/26/2011 1643   CrCl cannot be calculated (Patient has no serum creatinine result on file.).  COAG Lab Results  Component Value Date   INR 1.0 07/26/2011     Radiology No results found.  Assessment/Plan 1.  Complication dialysis device:  Patient's dialysis catheter is not functioning and yielding inadequate flow. She has recently been given a TPA infusion and this apparently was not sufficient to restore appropriate hemodynamics. She will therefore undergo exchange of the catheter for a new catheter. Risks and benefits are reviewed all questions answered patient has agreed to proceed  Potassium will be drawn to ensure that it is an appropriate level prior to performing thrombectomy. 2.  End-stage renal disease requiring hemodialysis:  Patient will continue dialysis therapy without further interruption if a successful thrombectomy is not achieved then catheter will be placed. Dialysis has already been arranged since the patient missed their previous session 3.  Hypertension:  Patient will continue medical management; nephrology is following no changes in oral medications. 4. Diabetes mellitus:  Glucose  will be monitored and oral medications been held this morning once the patient has undergone the patient's procedure po intake will be reinitiated and again Accu-Cheks will be used to assess the blood glucose level and treat as needed. The patient will be restarted on the patient's usual hypoglycemic regime     Airen Dales, Dolores Lory, MD  05/11/2015 12:24 PM

## 2015-05-12 ENCOUNTER — Encounter: Payer: Self-pay | Admitting: Vascular Surgery

## 2015-05-17 ENCOUNTER — Other Ambulatory Visit: Payer: Self-pay | Admitting: Vascular Surgery

## 2015-05-18 ENCOUNTER — Encounter
Admission: RE | Disposition: A | Discharge status: Home / Self Care | Payer: Self-pay | Source: Ambulatory Visit | Attending: Vascular Surgery

## 2015-05-18 ENCOUNTER — Ambulatory Visit
Admission: RE | Admit: 2015-05-18 | Discharge: 2015-05-18 | Disposition: A | Discharge status: Home / Self Care | Payer: Medicare (Managed Care) | Source: Ambulatory Visit | Attending: Vascular Surgery | Admitting: Vascular Surgery

## 2015-05-18 ENCOUNTER — Encounter: Payer: Self-pay | Admitting: *Deleted

## 2015-05-18 DIAGNOSIS — E785 Hyperlipidemia, unspecified: Secondary | ICD-10-CM | POA: Diagnosis not present

## 2015-05-18 DIAGNOSIS — N186 End stage renal disease: Secondary | ICD-10-CM | POA: Insufficient documentation

## 2015-05-18 DIAGNOSIS — I739 Peripheral vascular disease, unspecified: Secondary | ICD-10-CM | POA: Insufficient documentation

## 2015-05-18 DIAGNOSIS — Z794 Long term (current) use of insulin: Secondary | ICD-10-CM | POA: Diagnosis not present

## 2015-05-18 DIAGNOSIS — Z79899 Other long term (current) drug therapy: Secondary | ICD-10-CM | POA: Insufficient documentation

## 2015-05-18 DIAGNOSIS — E1122 Type 2 diabetes mellitus with diabetic chronic kidney disease: Secondary | ICD-10-CM | POA: Diagnosis not present

## 2015-05-18 DIAGNOSIS — Z992 Dependence on renal dialysis: Secondary | ICD-10-CM | POA: Diagnosis not present

## 2015-05-18 DIAGNOSIS — Z452 Encounter for adjustment and management of vascular access device: Secondary | ICD-10-CM | POA: Insufficient documentation

## 2015-05-18 DIAGNOSIS — I120 Hypertensive chronic kidney disease with stage 5 chronic kidney disease or end stage renal disease: Secondary | ICD-10-CM | POA: Diagnosis not present

## 2015-05-18 HISTORY — PX: PERIPHERAL VASCULAR CATHETERIZATION: SHX172C

## 2015-05-18 LAB — POTASSIUM (ARMC VASCULAR LAB ONLY): POTASSIUM (ARMC VASCULAR LAB): 4.5 (ref 3.5–5.1)

## 2015-05-18 LAB — GLUCOSE, CAPILLARY: Glucose-Capillary: 149 mg/dL — ABNORMAL HIGH (ref 65–99)

## 2015-05-18 SURGERY — DIALYSIS/PERMA CATHETER INSERTION
Anesthesia: Moderate Sedation

## 2015-05-18 MED ORDER — MIDAZOLAM HCL 2 MG/2ML IJ SOLN
INTRAMUSCULAR | Status: DC | PRN
  Administered 2015-05-18 (×2): 1 mg via INTRAVENOUS
  Administered 2015-05-18: 2 mg via INTRAVENOUS

## 2015-05-18 MED ORDER — SODIUM CHLORIDE 0.9 % IV SOLN
INTRAVENOUS | Status: DC
  Administered 2015-05-18: 08:00:00 via INTRAVENOUS

## 2015-05-18 MED ORDER — LIDOCAINE-EPINEPHRINE (PF) 1 %-1:200000 IJ SOLN
INTRAMUSCULAR | Status: AC
  Filled 2015-05-18: qty 30

## 2015-05-18 MED ORDER — LIDOCAINE-EPINEPHRINE (PF) 1 %-1:200000 IJ SOLN
INTRAMUSCULAR | Status: DC | PRN
  Administered 2015-05-18: 10 mL via INTRADERMAL

## 2015-05-18 MED ORDER — CEFUROXIME SODIUM 1.5 G IJ SOLR
1.5000 g | INTRAMUSCULAR | Status: AC
  Administered 2015-05-18: 1.5 g via INTRAVENOUS
  Filled 2015-05-18: qty 1.5

## 2015-05-18 MED ORDER — FENTANYL CITRATE (PF) 100 MCG/2ML IJ SOLN
INTRAMUSCULAR | Status: DC | PRN
  Administered 2015-05-18 (×3): 50 ug via INTRAVENOUS

## 2015-05-18 MED ORDER — FENTANYL CITRATE (PF) 100 MCG/2ML IJ SOLN
INTRAMUSCULAR | Status: AC
  Filled 2015-05-18: qty 2

## 2015-05-18 MED ORDER — INSULIN ASPART 100 UNIT/ML ~~LOC~~ SOLN: 0.0000 [IU] | SUBCUTANEOUS | Status: DC

## 2015-05-18 MED ORDER — HEPARIN (PORCINE) IN NACL 2-0.9 UNIT/ML-% IJ SOLN
INTRAMUSCULAR | Status: AC
  Filled 2015-05-18: qty 500

## 2015-05-18 MED ORDER — HEPARIN SODIUM (PORCINE) 10000 UNIT/ML IJ SOLN
INTRAMUSCULAR | Status: AC
  Filled 2015-05-18: qty 1

## 2015-05-18 MED ORDER — MIDAZOLAM HCL 5 MG/5ML IJ SOLN
INTRAMUSCULAR | Status: AC
  Filled 2015-05-18: qty 5

## 2015-05-18 SURGICAL SUPPLY — 10 items
BIOPATCH WHT 1IN DISK W/4.0 H (GAUZE/BANDAGES/DRESSINGS) ×3 IMPLANT
CATH PALINDROME RT-P 15FX19CM (CATHETERS) ×3 IMPLANT
DERMABOND ADVANCED (GAUZE/BANDAGES/DRESSINGS) ×2
DERMABOND ADVANCED .7 DNX12 (GAUZE/BANDAGES/DRESSINGS) ×1 IMPLANT
DRAPE INCISE IOBAN 66X45 STRL (DRAPES) ×3 IMPLANT
PACK ANGIOGRAPHY (CUSTOM PROCEDURE TRAY) ×3 IMPLANT
PREP CHG 10.5 TEAL (MISCELLANEOUS) ×6 IMPLANT
SUT MNCRL AB 4-0 PS2 18 (SUTURE) ×3 IMPLANT
SUT SILK 0 FSL (SUTURE) ×3 IMPLANT
TOWEL OR 17X26 4PK STRL BLUE (TOWEL DISPOSABLE) ×6 IMPLANT

## 2015-05-18 NOTE — Op Note (Signed)
OPERATIVE NOTE   PROCEDURE: 1. Insertion of tunneled dialysis catheter right internal jugular approach.  PRE-OPERATIVE DIAGNOSIS: Complication of dialysis device with poor function of left tunneled catheter; end stage renal disease  POST-OPERATIVE DIAGNOSIS: Same  SURGEON: Schnier, Dolores Lory.  ANESTHESIA: Conscious sedation with 1% lidocaine local infiltration  ESTIMATED BLOOD LOSS: Minimal cc  CONTRAST USED:  None  FLUOROSCOPY TIME:    INDICATIONS:   Amy Wu a 64 y.o. y.o. female who presents with poor flows and inability to achieve adequate dialysis from the left tunneled catheter Dr. Darden Dates has requested a new catheter be placed a stiff different site.  DESCRIPTION: After obtaining full informed written consent, the patient was positioned supine. The right internal neck and chest wall was prepped and draped in a sterile fashion. Ultrasound was placed in a sterile sleeve. Ultrasound was utilized to identify the right internal jugular vein which is noted to be echolucent and compressible indicating patency. Images recorded for the permanent record. Under real-time visualization a Seldinger needle is inserted into the vein and the guidewires advanced without difficulty. Small counterincision was made at the wire insertion site. Dilators are passed over the wire and the tunneled dialysis catheter is fed into the central venous system without difficulty.  Under fluoroscopy the catheter tip positioned at the atrial caval junction. The catheter is then approximated to the chest wall and an exit site selected. 1% lidocaine is infiltrated in soft tissues at this level small incision is made and the tunneling device is then passed from the exit site to the neck counterincision. Catheter is then connected to the tunneling device and the catheter was pulled subcutaneously. It is then transected and the hub assembly connected without difficulty. Both lumens aspirate and flush easily. After  verification of smooth contour with proper tip position under fluoroscopy the catheter is packed with 5000 units of heparin per lumen.  Catheter secured to the skin of the right chest wall with 0 silk. A sterile dressing is applied with a Biopatch.  COMPLICATIONS: None  CONDITION: Good  Schnier, Dolores Lory [facility]. Office:  [phone]   05/18/2015,9:19 AM

## 2015-05-18 NOTE — Op Note (Signed)
  OPERATIVE NOTE   PROCEDURE: 1. Removal of a left IJ tunneled dialysis catheter  PRE-OPERATIVE DIAGNOSIS: Complication of dialysis catheter, End stage renal disease  POST-OPERATIVE DIAGNOSIS: Same  SURGEON: Debra Calabretta, Dolores Lory, M.D.  ANESTHESIA: Local anesthetic with 1% lidocaine with epinephrine   ESTIMATED BLOOD LOSS: Minimal   FINDING(S): 1. Catheter intact   SPECIMEN(S):  Catheter  INDICATIONS:   Amy Wu is a 64 y.o. female who presents with poor function of the left IJ catheter.  The patient has undergone placement of an extremity access which is working and this has been successfully cannulated without difficulty.  therefore is undergoing removal of his tunneled catheter which is no longer needed to avoid septic complications.   DESCRIPTION: After obtaining full informed written consent, the patient was positioned supine. The left IJ catheter and surrounding area is prepped and draped in a sterile fashion. The cuff was localized by palpation and noted to be less than 3 cm from the exit site. After appropriate timeout is called, 1% lidocaine with epinephrine is infiltrated into the surrounding tissues around the cuff. Small transverse incision is created at the exit site with an 11 blade scalpel and the dissection was carried up along the catheter to expose the cuff of the tunneled catheter.  The catheter cuff is then freed from the surrounding attachments and adhesions. Once the catheter has been freed circumferentially it is removed in 1 piece. Light pressure was held at the base of the neck.   Antibiotic ointment and a sterile dressing is applied to the exit site. Patient tolerated procedure well and there were no complications.  COMPLICATIONS: None  CONDITION: Unchanged  Kriti Katayama, Dolores Lory, M.D. [facility] Office: [phone]  05/18/2015,9:20 AM

## 2015-05-18 NOTE — Discharge Instructions (Signed)

## 2015-05-18 NOTE — OR Nursing (Signed)
Pt was recovered from proc. By 10 AM ,awaiting ride SEE above note

## 2015-05-18 NOTE — OR Nursing (Signed)
1030 Pt up in [place] awaiting ride home, upper site on Right chest area (dermabound had been applied from procedure, oozing. MD notified and in to evaluate site,at 1100 (scrubed in procedure at time it occurred and site checked by C. Timmons RTR) 1 suture required and area sutured by MD.

## 2015-05-18 NOTE — H&P (Signed)
[facility] History & Physical Update  The patient was interviewed and re-examined.  The patient's previous History and Physical has been reviewed and is unchanged.  There is no change in the plan of care. We plan to proceed with the scheduled procedure.  Schnier, Dolores Lory, MD  05/18/2015, 8:39 AM

## 2015-05-26 ENCOUNTER — Other Ambulatory Visit: Payer: Self-pay | Admitting: Family

## 2015-05-26 DIAGNOSIS — Z1231 Encounter for screening mammogram for malignant neoplasm of breast: Secondary | ICD-10-CM

## 2015-06-02 ENCOUNTER — Other Ambulatory Visit: Payer: Self-pay | Admitting: Vascular Surgery

## 2015-06-02 ENCOUNTER — Ambulatory Visit
Admission: RE | Admit: 2015-06-02 | Discharge: 2015-06-02 | Disposition: A | Discharge status: Home / Self Care | Payer: Medicare (Managed Care) | Source: Ambulatory Visit | Attending: Vascular Surgery | Admitting: Vascular Surgery

## 2015-06-02 DIAGNOSIS — N186 End stage renal disease: Secondary | ICD-10-CM | POA: Insufficient documentation

## 2015-06-02 SURGERY — Surgical Case
Anesthesia: *Unknown

## 2015-06-02 MED ORDER — SODIUM CHLORIDE 0.9 % IV SOLN
5.0000 mg | Freq: Once | INTRAVENOUS | Status: DC
  Filled 2015-06-02: qty 5

## 2015-06-02 NOTE — OR Nursing (Signed)
12:20  Vascular tech. In after speaking with Dr. Delana Meyer and is going to try to flush perma-catheter to see if TPA is necessary.

## 2015-06-07 ENCOUNTER — Ambulatory Visit
Admission: RE | Admit: 2015-06-07 | Discharge: 2015-06-07 | Disposition: A | Discharge status: Home / Self Care | Payer: Medicare (Managed Care) | Source: Ambulatory Visit | Attending: Vascular Surgery | Admitting: Vascular Surgery

## 2015-06-07 ENCOUNTER — Other Ambulatory Visit: Payer: Self-pay | Admitting: Vascular Surgery

## 2015-06-07 DIAGNOSIS — Z992 Dependence on renal dialysis: Secondary | ICD-10-CM | POA: Diagnosis not present

## 2015-06-07 DIAGNOSIS — Z452 Encounter for adjustment and management of vascular access device: Secondary | ICD-10-CM | POA: Insufficient documentation

## 2015-06-07 DIAGNOSIS — N186 End stage renal disease: Secondary | ICD-10-CM | POA: Diagnosis not present

## 2015-06-07 MED ORDER — SODIUM CHLORIDE 0.9 % IV SOLN
5.0000 mg | Freq: Once | INTRAVENOUS | Status: AC
  Administered 2015-06-07: 5 mg via INTRAVENOUS
  Filled 2015-06-07: qty 5

## 2015-06-07 MED ORDER — ONDANSETRON HCL 4 MG/2ML IJ SOLN: 4.0000 mg | Freq: Four times a day (QID) | INTRAMUSCULAR | Status: DC | PRN

## 2015-06-07 NOTE — Discharge Instructions (Signed)

## 2015-06-08 ENCOUNTER — Other Ambulatory Visit
Admission: RE | Admit: 2015-06-08 | Discharge: 2015-06-08 | Disposition: A | Discharge status: Home / Self Care | Payer: Medicare (Managed Care) | Source: Ambulatory Visit | Attending: Nephrology | Admitting: Nephrology

## 2015-06-08 DIAGNOSIS — D631 Anemia in chronic kidney disease: Secondary | ICD-10-CM | POA: Diagnosis present

## 2015-06-08 LAB — HEMOGLOBIN: Hemoglobin: 10.8 g/dL — ABNORMAL LOW (ref 12.0–16.0)

## 2015-06-11 NOTE — Op Note (Signed)
 [facility]   OPERATIVE NOTE   PROCEDURE: 1. Continuous infusion of TPA for nonfunctional tunneled hemodialysis catheter  PRE-OPERATIVE DIAGNOSIS: 1. ESRD 2. Nonfunctional tunneled hemodialysis catheter  POST-OPERATIVE DIAGNOSIS: 1. ESRD 2. Nonfunctional tunneled hemodialysis catheter  SURGEON: [name], MD  ASSISTANT(S): None  ANESTHESIA: None  ESTIMATED BLOOD LOSS: Minimal  FINDING(S): 1.  Catheters withdrew blood well and flushed easily after infusion  SPECIMEN(S):  None  INDICATIONS:   Amy Wu is a 64 y.o. female who presents with a nonfunctional tunneled dialysis catheter.  We are performing a TPA infusion to try to open the catheter and salvage this for use.  Risks and benefits were discussed.  DESCRIPTION: After obtaining full informed written consent, the patient is brought to the vascular and interventional radiology area. 2 mg of alteplase is infused over a two-hour period through both lumens of the tunneled hemodialysis catheter. At the conclusion of the infusion, we sterilely withdrew blood and flushed easily with sterile saline in both lumens. A concentrated heparin solution was then placed in both lumens and sterile caps were placed. The patient tolerated the procedure well.  COMPLICATIONS: None  CONDITION: Stable  [name]  06/11/2015, 12:23 PM  Continu

## 2015-06-22 ENCOUNTER — Encounter
Admission: RE | Admit: 2015-06-22 | Discharge: 2015-06-22 | Disposition: A | Discharge status: Home / Self Care | Payer: Medicare (Managed Care) | Source: Ambulatory Visit | Attending: Vascular Surgery | Admitting: Vascular Surgery

## 2015-06-22 DIAGNOSIS — Z01810 Encounter for preprocedural cardiovascular examination: Secondary | ICD-10-CM | POA: Diagnosis present

## 2015-06-22 DIAGNOSIS — Z01812 Encounter for preprocedural laboratory examination: Secondary | ICD-10-CM | POA: Diagnosis present

## 2015-06-22 LAB — DIFFERENTIAL
BASOS ABS: 0.1 10*3/uL (ref 0–0.1)
Eosinophils Absolute: 0.5 10*3/uL (ref 0–0.7)
Eosinophils Relative: 5 %
Lymphocytes Relative: 25 %
Lymphs Abs: 2.5 10*3/uL (ref 1.0–3.6)
Monocytes Absolute: 0.8 10*3/uL (ref 0.2–0.9)
Monocytes Relative: 8 %
NEUTROS ABS: 6 10*3/uL (ref 1.4–6.5)

## 2015-06-22 LAB — APTT: APTT: 30 s (ref 24–36)

## 2015-06-22 LAB — BASIC METABOLIC PANEL
ANION GAP: 12 (ref 5–15)
BUN: 38 mg/dL — ABNORMAL HIGH (ref 6–20)
CALCIUM: 9.5 mg/dL (ref 8.9–10.3)
CO2: 27 mmol/L (ref 22–32)
Chloride: 102 mmol/L (ref 101–111)
Creatinine, Ser: 7.47 mg/dL — ABNORMAL HIGH (ref 0.44–1.00)
GFR, EST AFRICAN AMERICAN: 6 mL/min — AB (ref >60)
GFR, EST NON AFRICAN AMERICAN: 5 mL/min — AB (ref >60)
Glucose, Bld: 118 mg/dL — ABNORMAL HIGH (ref 65–99)
POTASSIUM: 4.7 mmol/L (ref 3.5–5.1)
SODIUM: 141 mmol/L (ref 135–145)

## 2015-06-22 LAB — CBC
HEMATOCRIT: 41.2 % (ref 35.0–47.0)
Hemoglobin: 13.4 g/dL (ref 12.0–16.0)
MCH: 25.5 pg — ABNORMAL LOW (ref 26.0–34.0)
MCHC: 32.4 g/dL (ref 32.0–36.0)
MCV: 78.7 fL — ABNORMAL LOW (ref 80.0–100.0)
PLATELETS: 169 10*3/uL (ref 150–440)
RBC: 5.23 MIL/uL — ABNORMAL HIGH (ref 3.80–5.20)
RDW: 18.8 % — AB (ref 11.5–14.5)
WBC: 9.9 10*3/uL (ref 3.6–11.0)

## 2015-06-22 LAB — PROTIME-INR
INR: 1.11
PROTHROMBIN TIME: 14.5 s (ref 11.4–15.0)

## 2015-06-22 LAB — ABO/RH: ABO/RH(D): O POS

## 2015-06-22 LAB — TYPE AND SCREEN
ABO/RH(D): O POS
Antibody Screen: NEGATIVE

## 2015-06-22 NOTE — Patient Instructions (Signed)
  Your procedure is scheduled on: 07/01/15 Report to Day Surgery. To find out your arrival time please call [phone] between 1PM - 3PM on 06/30/15.  Remember: Instructions that are not followed completely may result in serious medical risk, up to and including death, or upon the discretion of your surgeon and anesthesiologist your surgery may need to be rescheduled.    _x___ 1. Do not eat food or drink liquids after midnight. No gum chewing or hard candies.     __x__ 2. No Alcohol for 24 hours before or after surgery.   ____ 3. Bring all medications with you on the day of surgery if instructed.    __x__ 4. Notify your doctor if there is any change in your medical condition     (cold, fever, infections).     Do not wear jewelry, make-up, hairpins, clips or nail polish.  Do not wear lotions, powders, or perfumes. You may wear deodorant.  Do not shave 48 hours prior to surgery. Men may shave face and neck.  Do not bring valuables to the hospital.    [facility] is not responsible for any belongings or valuables.               Contacts, dentures or bridgework may not be worn into surgery.  Leave your suitcase in the car. After surgery it may be brought to your room.  For patients admitted to the hospital, discharge time is determined by your                treatment team.   Patients discharged the day of surgery will not be allowed to drive home.   Please read over the following fact sheets that you were given:   Surgical Site Infection Prevention   ____ Take these medicines the morning of surgery with A SIP OF WATER:    1.   2.   3.   4.  5.  6.  ____ Fleet Enema (as directed)   __x__ Use CHG Soap as directed  ____ Use inhalers on the day of surgery  ____ Stop metformin 2 days prior to surgery    __x__ Take 1/2 of usual insulin dose the night before surgery and none on the morning of surgery.   ____ Stop Coumadin/Plavix/aspirin on 06/30/15  __x__ Stop  Anti-inflammatories on only tylenol   ____ Stop supplements until after surgery.    ____ Bring C-Pap to the hospital.

## 2015-06-23 NOTE — Pre-Procedure Instructions (Signed)
SPOKE WITH DR [facility] ABOUT EKG AND EKG FROM 2013 AND 2011. OK TO PROCEED

## 2015-06-23 NOTE — Pre-Procedure Instructions (Signed)
CBC & Met B sent to anesthesia and Dr. Lucky Cowboy for review.

## 2015-07-01 ENCOUNTER — Ambulatory Visit
Admission: RE | Admit: 2015-07-01 | Discharge: 2015-07-01 | Disposition: A | Discharge status: Home / Self Care | Payer: Medicare (Managed Care) | Source: Ambulatory Visit | Attending: Vascular Surgery | Admitting: Vascular Surgery

## 2015-07-01 ENCOUNTER — Ambulatory Visit: Payer: Medicare (Managed Care) | Admitting: Anesthesiology

## 2015-07-01 ENCOUNTER — Encounter
Admission: RE | Disposition: A | Discharge status: Home / Self Care | Payer: Self-pay | Source: Ambulatory Visit | Attending: Vascular Surgery

## 2015-07-01 ENCOUNTER — Encounter: Payer: Self-pay | Admitting: *Deleted

## 2015-07-01 DIAGNOSIS — Z992 Dependence on renal dialysis: Secondary | ICD-10-CM | POA: Diagnosis not present

## 2015-07-01 DIAGNOSIS — E1151 Type 2 diabetes mellitus with diabetic peripheral angiopathy without gangrene: Secondary | ICD-10-CM | POA: Diagnosis not present

## 2015-07-01 DIAGNOSIS — Z8349 Family history of other endocrine, nutritional and metabolic diseases: Secondary | ICD-10-CM | POA: Insufficient documentation

## 2015-07-01 DIAGNOSIS — Z841 Family history of disorders of kidney and ureter: Secondary | ICD-10-CM | POA: Insufficient documentation

## 2015-07-01 DIAGNOSIS — E7800 Pure hypercholesterolemia, unspecified: Secondary | ICD-10-CM | POA: Insufficient documentation

## 2015-07-01 DIAGNOSIS — Z79899 Other long term (current) drug therapy: Secondary | ICD-10-CM | POA: Insufficient documentation

## 2015-07-01 DIAGNOSIS — Z833 Family history of diabetes mellitus: Secondary | ICD-10-CM | POA: Diagnosis not present

## 2015-07-01 DIAGNOSIS — Z8249 Family history of ischemic heart disease and other diseases of the circulatory system: Secondary | ICD-10-CM | POA: Insufficient documentation

## 2015-07-01 DIAGNOSIS — Z89512 Acquired absence of left leg below knee: Secondary | ICD-10-CM | POA: Insufficient documentation

## 2015-07-01 DIAGNOSIS — E1122 Type 2 diabetes mellitus with diabetic chronic kidney disease: Secondary | ICD-10-CM | POA: Diagnosis not present

## 2015-07-01 DIAGNOSIS — I120 Hypertensive chronic kidney disease with stage 5 chronic kidney disease or end stage renal disease: Secondary | ICD-10-CM | POA: Insufficient documentation

## 2015-07-01 DIAGNOSIS — Z89611 Acquired absence of right leg above knee: Secondary | ICD-10-CM | POA: Insufficient documentation

## 2015-07-01 DIAGNOSIS — Z7982 Long term (current) use of aspirin: Secondary | ICD-10-CM | POA: Insufficient documentation

## 2015-07-01 DIAGNOSIS — N186 End stage renal disease: Secondary | ICD-10-CM | POA: Diagnosis not present

## 2015-07-01 HISTORY — PX: AV FISTULA PLACEMENT: SHX1204

## 2015-07-01 HISTORY — PX: ENDARTERECTOMY FEMORAL: SHX5804

## 2015-07-01 LAB — POCT I-STAT 4, (NA,K, GLUC, HGB,HCT)
Glucose, Bld: 124 mg/dL — ABNORMAL HIGH (ref 65–99)
HEMATOCRIT: 45 % (ref 36.0–46.0)
HEMOGLOBIN: 15.3 g/dL — AB (ref 12.0–15.0)
POTASSIUM: 4.9 mmol/L (ref 3.5–5.1)
SODIUM: 139 mmol/L (ref 135–145)

## 2015-07-01 LAB — GLUCOSE, CAPILLARY
Glucose-Capillary: 103 mg/dL — ABNORMAL HIGH (ref 65–99)
Glucose-Capillary: 112 mg/dL — ABNORMAL HIGH (ref 65–99)

## 2015-07-01 SURGERY — INSERTION OF ARTERIOVENOUS (AV) GORE-TEX GRAFT THIGH
Anesthesia: General | Site: Leg Upper | Laterality: Left | Wound class: Clean

## 2015-07-01 MED ORDER — SODIUM CHLORIDE 0.9 % IV SOLN
10000.0000 ug | INTRAVENOUS | Status: DC | PRN
  Administered 2015-07-01: 40 ug/min via INTRAVENOUS

## 2015-07-01 MED ORDER — SODIUM CHLORIDE 0.9 % IJ SOLN
INTRAMUSCULAR | Status: AC
  Filled 2015-07-01: qty 20

## 2015-07-01 MED ORDER — CEFAZOLIN SODIUM 1-5 GM-% IV SOLN
1.0000 g | Freq: Once | INTRAVENOUS | Status: AC
  Administered 2015-07-01: 1 g via INTRAVENOUS

## 2015-07-01 MED ORDER — HEPARIN SODIUM (PORCINE) 1000 UNIT/ML IJ SOLN
INTRAMUSCULAR | Status: DC | PRN
  Administered 2015-07-01: 3500 [IU] via INTRAVENOUS

## 2015-07-01 MED ORDER — HEPARIN SODIUM (PORCINE) 5000 UNIT/ML IJ SOLN
INTRAMUSCULAR | Status: AC
  Filled 2015-07-01: qty 1

## 2015-07-01 MED ORDER — VANCOMYCIN HCL IN DEXTROSE 1-5 GM/200ML-% IV SOLN
1000.0000 mg | Freq: Once | INTRAVENOUS | Status: AC
  Administered 2015-07-01: 1000 mg via INTRAVENOUS

## 2015-07-01 MED ORDER — FENTANYL CITRATE (PF) 100 MCG/2ML IJ SOLN
INTRAMUSCULAR | Status: DC | PRN
  Administered 2015-07-01: 25 ug via INTRAVENOUS
  Administered 2015-07-01: 50 ug via INTRAVENOUS
  Administered 2015-07-01: 25 ug via INTRAVENOUS

## 2015-07-01 MED ORDER — ONDANSETRON HCL 4 MG/2ML IJ SOLN
INTRAMUSCULAR | Status: DC | PRN
  Administered 2015-07-01: 4 mg via INTRAVENOUS

## 2015-07-01 MED ORDER — SODIUM CHLORIDE 0.9 % IJ SOLN
INTRAMUSCULAR | Status: AC
  Filled 2015-07-01: qty 100

## 2015-07-01 MED ORDER — CEFAZOLIN SODIUM 1-5 GM-% IV SOLN
INTRAVENOUS | Status: AC
  Filled 2015-07-01: qty 50

## 2015-07-01 MED ORDER — FENTANYL CITRATE (PF) 100 MCG/2ML IJ SOLN: 25.0000 ug | INTRAMUSCULAR | Status: DC | PRN

## 2015-07-01 MED ORDER — ONDANSETRON HCL 4 MG/2ML IJ SOLN: 4.0000 mg | Freq: Once | INTRAMUSCULAR | Status: DC | PRN

## 2015-07-01 MED ORDER — FAMOTIDINE 20 MG PO TABS
20.0000 mg | ORAL_TABLET | Freq: Once | ORAL | Status: AC
Start: 2015-07-01 — End: 2015-07-01
  Administered 2015-07-01: 20 mg via ORAL

## 2015-07-01 MED ORDER — HEPARIN SODIUM (PORCINE) 5000 UNIT/ML IJ SOLN
INTRAMUSCULAR | Status: AC
  Filled 2015-07-01: qty 5

## 2015-07-01 MED ORDER — PAPAVERINE HCL 30 MG/ML IJ SOLN
INTRAMUSCULAR | Status: AC
  Filled 2015-07-01: qty 2

## 2015-07-01 MED ORDER — PROPOFOL 10 MG/ML IV BOLUS
INTRAVENOUS | Status: DC | PRN
  Administered 2015-07-01: 20 mg via INTRAVENOUS

## 2015-07-01 MED ORDER — FAMOTIDINE 20 MG PO TABS
ORAL_TABLET | ORAL | Status: AC
  Administered 2015-07-01: 20 mg via ORAL
  Filled 2015-07-01: qty 1

## 2015-07-01 MED ORDER — PHENYLEPHRINE HCL 10 MG/ML IJ SOLN
INTRAMUSCULAR | Status: DC | PRN
  Administered 2015-07-01 (×4): 100 ug via INTRAVENOUS
  Administered 2015-07-01: 200 ug via INTRAVENOUS
  Administered 2015-07-01 (×2): 100 ug via INTRAVENOUS

## 2015-07-01 MED ORDER — HYDROCODONE-ACETAMINOPHEN 5-325 MG PO TABS: 1.0000 | ORAL_TABLET | Freq: Four times a day (QID) | ORAL | Status: DC | PRN

## 2015-07-01 MED ORDER — VANCOMYCIN HCL IN DEXTROSE 1-5 GM/200ML-% IV SOLN
INTRAVENOUS | Status: AC
  Filled 2015-07-01: qty 200

## 2015-07-01 MED ORDER — BUPIVACAINE-EPINEPHRINE (PF) 0.5% -1:200000 IJ SOLN
INTRAMUSCULAR | Status: AC
  Filled 2015-07-01: qty 30

## 2015-07-01 MED ORDER — SODIUM CHLORIDE 0.9 % IV SOLN
INTRAVENOUS | Status: DC
  Administered 2015-07-01: 11:00:00 via INTRAVENOUS

## 2015-07-01 SURGICAL SUPPLY — 55 items
BAG DECANTER FOR FLEXI CONT (MISCELLANEOUS) ×3 IMPLANT
BLADE SURG SZ11 CARB STEEL (BLADE) ×3 IMPLANT
BOOT SUTURE AID YELLOW STND (SUTURE) IMPLANT
BRUSH SCRUB 4% CHG (MISCELLANEOUS) ×3 IMPLANT
CANISTER SUCT 1200ML W/VALVE (MISCELLANEOUS) ×3 IMPLANT
CHLORAPREP W/TINT 26ML (MISCELLANEOUS) ×3 IMPLANT
CLIP SPRNG 6MM S-JAW DBL (CLIP) ×3
DRAPE INCISE IOBAN 66X45 STRL (DRAPES) ×3 IMPLANT
ELECT CAUTERY BLADE 6.4 (BLADE) ×3 IMPLANT
EVICEL 2ML SEALANT HUMAN (Miscellaneous) ×3 IMPLANT
GEL ULTRASOUND 20GR AQUASONIC (MISCELLANEOUS) IMPLANT
GLOVE BIO SURGEON STRL SZ7 (GLOVE) ×3 IMPLANT
GOWN STRL REUS W/ TWL LRG LVL3 (GOWN DISPOSABLE) ×2 IMPLANT
GOWN STRL REUS W/ TWL XL LVL3 (GOWN DISPOSABLE) ×2 IMPLANT
GOWN STRL REUS W/TWL LRG LVL3 (GOWN DISPOSABLE) ×1
GOWN STRL REUS W/TWL XL LVL3 (GOWN DISPOSABLE) ×1
GRAFT COLLAGEN VASCULAR 7X45 (Vascular Products) ×3 IMPLANT
HEMOSTAT SURGICEL 2X3 (HEMOSTASIS) ×6 IMPLANT
IV NS 500ML (IV SOLUTION) ×1
IV NS 500ML BAXH (IV SOLUTION) ×2 IMPLANT
KIT RM TURNOVER STRD PROC AR (KITS) ×3 IMPLANT
LABEL OR SOLS (LABEL) ×3 IMPLANT
LIQUID BAND (GAUZE/BANDAGES/DRESSINGS) ×3 IMPLANT
LOOP RED MAXI  1X406MM (MISCELLANEOUS) ×2
LOOP VESSEL MAXI 1X406 RED (MISCELLANEOUS) ×4 IMPLANT
LOOP VESSEL MINI 0.8X406 BLUE (MISCELLANEOUS) ×2 IMPLANT
LOOPS BLUE MINI 0.8X406MM (MISCELLANEOUS) ×1
NEEDLE FILTER BLUNT 18X 1/2SAF (NEEDLE) ×1
NEEDLE FILTER BLUNT 18X1 1/2 (NEEDLE) ×2 IMPLANT
NEEDLE HYPO 30X.5 LL (NEEDLE) IMPLANT
NS IRRIG 500ML POUR BTL (IV SOLUTION) ×3 IMPLANT
PACK EXTREMITY ARMC (MISCELLANEOUS) ×3 IMPLANT
PAD GROUND ADULT SPLIT (MISCELLANEOUS) ×3 IMPLANT
PAD PREP 24X41 OB/GYN DISP (PERSONAL CARE ITEMS) ×3 IMPLANT
SOLUTION CELL SAVER (CLIP) ×2 IMPLANT
SPONGE XRAY 4X4 16PLY STRL (MISCELLANEOUS) ×3 IMPLANT
STOCKINETTE STRL 4IN 9604848 (GAUZE/BANDAGES/DRESSINGS) IMPLANT
SUT MNCRL AB 4-0 PS2 18 (SUTURE) ×3 IMPLANT
SUT PROLENE 6 0 BV (SUTURE) ×6 IMPLANT
SUT SILK 2 0 (SUTURE) ×1
SUT SILK 2 0 SH (SUTURE) ×3 IMPLANT
SUT SILK 2-0 18XBRD TIE 12 (SUTURE) ×2 IMPLANT
SUT SILK 3 0 (SUTURE) ×1
SUT SILK 3-0 18XBRD TIE 12 (SUTURE) ×2 IMPLANT
SUT SILK 4 0 (SUTURE)
SUT SILK 4-0 18XBRD TIE 12 (SUTURE) IMPLANT
SUT VIC AB 2-0 CT1 (SUTURE) ×3 IMPLANT
SUT VIC AB 2-0 SH 27 (SUTURE) ×1
SUT VIC AB 2-0 SH 27XBRD (SUTURE) ×2 IMPLANT
SUT VIC AB 3-0 SH 27 (SUTURE)
SUT VIC AB 3-0 SH 27X BRD (SUTURE) IMPLANT
SYR 20CC LL (SYRINGE) IMPLANT
SYR 3ML LL SCALE MARK (SYRINGE) ×3 IMPLANT
SYR TB 1ML 27GX1/2 LL (SYRINGE) IMPLANT
TOWEL OR 17X26 4PK STRL BLUE (TOWEL DISPOSABLE) IMPLANT

## 2015-07-01 NOTE — Discharge Instructions (Signed)

## 2015-07-01 NOTE — Op Note (Signed)
[facility]  OPERATIVE NOTE   PROCEDURE:  1. Left SFA to left CFV arteriovenous graft with 6 mm Artegraft 2. SFA endarterectomy  PRE-OPERATIVE DIAGNOSIS: 1. ESRD, longstanding with multiple failed previous accesses 2.  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 Common femoral artery, profunda femoris artery, and superficial femoral artery were all severely diseased. Left femoral vein was patent  SPECIMEN(S):  None  INDICATIONS:   Amy Wu is a 65 y.o. female who presents with ESRD and limited dialysis access options.  We are now placing a thigh graft for long term access.  Risk, benefits, and alternatives to thigh arteriovenous graft placement were discussed.  The patient is aware the risks include but are not limited to: bleeding, infection, steal syndrome, nerve damage, ischemic monomelic neuropathy, failure to mature, possible development of critical limb ischemia due to steal syndrome and need for additional procedures.  The patient is aware of the risks and elects to proceed forward.  DESCRIPTION: After full informed written consent was obtained from the patient, the patient was brought back to the operating room and placed supine upon the operating table.  The patient was given IV antibiotics prior to proceeding.  After obtaining adequate anesthesia, the patient was prepped and draped in standard fashion for a left thigh arteriovenous graft placement.  I made a transverse/oblique incision over the common femoral artery and dissected down through the subcutaneous tissue until I had access to the common femoral artery and common femoral vein.  The common femoral artery appeared to be heavily diseased. The disease tracked into the primary to profunda femoris artery branches as well as into the superficial femoral artery.  Patient on side extremely limited dialysis access options with all of her upper extremity access is being felt to have been exhausted. Given her limited access options and the fact that she already had an amputation on this side I felt we were able to use her superficial femoral artery for inflow would preserve the profunda femoris artery and maintain adequate perfusion to her amputated leg. Her superficial femoral artery was heavily diseased and required endarterectomy to allow graft placement. We dissected out the superficial femoral artery over its proximal 5-6 cm after encircling the 2 primary profunda femoris artery branches with vessel loops and the common femoral artery with a vessel loop. A vessel loop was then placed in the superficial femoral artery well beyond the origin. The common femoral vein appeared to be patent and useable for graft creation.  I obtained a 6 mm diameter by 47 cm length Artegraft  and prepared it for use.  Then using the most curved tunneler I tunneled from the groin to the lateral thigh incision and delivered the graft.  I then tunneled from lateral thigh incision to medial thigh incision and delivered the graft.  Finally, I tunneled from medial thigh incision to groin and finished delivering the graft.  The orientation of the graft was maintained throughout this process.  I then gave the patient 3500 units of heparin to gain some anticoagulation.  After waiting 3 minutes, I then clamped the common femoral artery proximally and pulled up control in the distal superficial femoral artery loop and the profunda femoris artery loops. I then made an arteriotomy on the first 3-4 cm of the superficial femoral artery and extended it with a Potts scissor. I  then used the [facility] elevator to remove a large bulky plaque from the proximal superficial femoral artery. I used hemostats to pull a chunk out distally and created a decent feathered proximal endpoint just beyond the origin of the SFA  beyond the common femoral artery.  I then spatulate the graft to meet the dimensions of the artery and sewed to the artery in an end-to-side configuration with running stitch of 6-0 Prolene suture.  I clamped the graft near its arterial anastomosis and released the clamps on the common femoral artery.  I then pulled the graft to appropriate tension throughout the tunnel.  I then clamped the common femoral vein proximally and distally using a partial occlusion clamp and made a venotomy, which I extended with a Pott's scissor.  The graft was then spatulated to meet the dimensions of the venotomy.  The graft was sewn to the vein in an end-to-side configuration with a running stitch of 6-0 Prolene suture.  Prior to completion this anastomosis, I released all clamps on the graft and vein, allowing the graft and vein to backbleed before completing the anastomosis.  There was excellent graft bleeding.   There was excellent backbleeding from the vein.  The anastomosis was completed in the usual fashion. A single 6-0 Prolene patch suture was used for hemostasis on the artery. The wound was irrigated. I placed Surgicel and Evicel in the wound and there was no further active bleeding.  The groin was repaired with a layer of 2-0 Vicryl, a double layer of 3-0 Vicryl, and a running subcuticular of 4-0 Monocryl.  The skin was then cleaned, dried, and Dermabond used to reinforce the skin closure.  The patient was then awakened and taken to the recovery room in stable condition having tolerated the procedure well.   COMPLICATIONS: None  CONDITION: stable  Amy Wu 07/01/2015 4:08 PM

## 2015-07-01 NOTE — OR Nursing (Signed)
Lower left extremity is warm to touch and bruit at op site palpable and both incisions dry.  Patient and caregiver instructed if bleeding were to occur to apply pressure and call 911.  Demonstrated to caregiver how to check for bruit with a return demonstration.  Also discussed s/s of infection.

## 2015-07-01 NOTE — Transfer of Care (Signed)
Immediate Anesthesia Transfer of Care Note  Patient: Amy Wu  Procedure(s) Performed: Procedure(s): INSERTION OF ARTERIOVENOUS (AV) GRAFT THIGH (ARTEGRAFT) (Left) SUPERFICIAL FEMORAL ENDARTERECTOMY  (Left)  Patient Location: PACU  Anesthesia Type:General  Level of Consciousness: sedated  Airway & Oxygen Therapy: Patient Spontanous Breathing and Patient connected to face mask oxygen  Post-op Assessment: Report given to RN and Post -op Vital signs reviewed and stable  Post vital signs: Reviewed and stable  Last Vitals:  Filed Vitals:   07/01/15 1020 07/01/15 1614  BP: 120/76 95/59  Pulse: 77 99  Temp: 35.6 C 36 C  Resp: 16 19    Complications: No apparent anesthesia complications

## 2015-07-01 NOTE — OR Nursing (Signed)
Dr Amie Critchley was notified of all vital signs during patient's post-op stay.  MD states patient is able to be discharged at this time.

## 2015-07-01 NOTE — Anesthesia Procedure Notes (Signed)
Procedure Name: LMA Insertion Date/Time: 07/01/2015 1:12 PM Performed by: Doreen Salvage Pre-anesthesia Checklist: Patient identified, Patient being monitored, Timeout performed, Emergency Drugs available and Suction available Patient Re-evaluated:Patient Re-evaluated prior to inductionOxygen Delivery Method: Circle system utilized Preoxygenation: Pre-oxygenation with 100% oxygen Intubation Type: IV induction Ventilation: Mask ventilation without difficulty LMA: LMA inserted LMA Size: 4.0 Tube type: Oral Number of attempts: 1 Placement Confirmation: positive ETCO2 and breath sounds checked- equal and bilateral Tube secured with: Tape Dental Injury: Teeth and Oropharynx as per pre-operative assessment

## 2015-07-01 NOTE — Anesthesia Preprocedure Evaluation (Addendum)
Anesthesia Evaluation  Patient identified by MRN, date of birth, ID band Patient awake    Reviewed: Allergy & Precautions, H&P , NPO status , Patient's Chart, lab work & pertinent test results, reviewed documented beta blocker date and time   Airway Mallampati: II  TM Distance: >3 FB Neck ROM: full    Dental no notable dental hx. (+) Teeth Intact   Pulmonary neg pulmonary ROS,    Pulmonary exam normal breath sounds clear to auscultation       Cardiovascular Exercise Tolerance: Good hypertension, + Past MI and + Peripheral Vascular Disease  negative cardio ROS Normal cardiovascular exam Rhythm:regular Rate:Normal     Neuro/Psych negative neurological ROS  negative psych ROS   GI/Hepatic negative GI ROS, Neg liver ROS,   Endo/Other  negative endocrine ROSdiabetes  Renal/GU ESRFRenal diseasenegative Renal ROS  negative genitourinary   Musculoskeletal   Abdominal   Peds  Hematology negative hematology ROS (+)   Anesthesia Other Findings   Reproductive/Obstetrics negative OB ROS                             Anesthesia Physical Anesthesia Plan  ASA: III  Anesthesia Plan: General   Post-op Pain Management:    Induction:   Airway Management Planned:   Additional Equipment:   Intra-op Plan:   Post-operative Plan:   Informed Consent: I have reviewed the patients History and Physical, chart, labs and discussed the procedure including the risks, benefits and alternatives for the proposed anesthesia with the patient or authorized representative who has indicated his/her understanding and acceptance.   Dental Advisory Given  Plan Discussed with: CRNA  Anesthesia Plan Comments:         Anesthesia Quick Evaluation

## 2015-07-01 NOTE — OR Nursing (Signed)
Patient's blood pressure continues to drop after ephedrine per CRNA.  Dr. Amie Critchley contacted about pressures and he checked patient, gave additional ephedrine and ordered a 300 cc bolus.  Patient had dialysis yesterday and 200 cc blood loss today.  Patient remains alert, denies pain, and resting comfortably.  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

## 2015-07-01 NOTE — Progress Notes (Signed)
Spoke with Dr. Raliegh Ip from [facility] regarding pain med prescription , MD sending script to [facility] .

## 2015-07-01 NOTE — Anesthesia Postprocedure Evaluation (Signed)
Anesthesia Post Note  Patient: Latrece Midyette Medina  Procedure(s) Performed: Procedure(s) (LRB): INSERTION OF ARTERIOVENOUS (AV) GRAFT THIGH (ARTEGRAFT) (Left) SUPERFICIAL FEMORAL ENDARTERECTOMY  (Left)  Patient location during evaluation: PACU Anesthesia Type: General Level of consciousness: awake and alert Pain management: pain level controlled Vital Signs Assessment: post-procedure vital signs reviewed and stable Respiratory status: spontaneous breathing, nonlabored ventilation, respiratory function stable and patient connected to nasal cannula oxygen Cardiovascular status: blood pressure returned to baseline and stable Postop Assessment: no signs of nausea or vomiting Anesthetic complications: no    Last Vitals:  Filed Vitals:   07/01/15 1714 07/01/15 1723  BP: 87/51 90/83  Pulse:  94  Temp: 36.1 C   Resp:  16    Last Pain:  Filed Vitals:   07/01/15 1725  PainSc: 0-No pain                 Precious Haws Turkessa Ostrom

## 2015-07-01 NOTE — OR Nursing (Signed)
Communicated with [facility] about patient discharged pain medications.

## 2015-07-01 NOTE — H&P (Signed)
[facility] History & Physical Update  The patient was interviewed and re-examined.  The patient's previous History and Physical has been reviewed and is unchanged.  There is no change in the plan of care. We plan to proceed with the scheduled procedure.  Cherree Conerly, MD  07/01/2015, 12:37 PM

## 2015-07-02 ENCOUNTER — Encounter: Payer: Self-pay | Admitting: Vascular Surgery

## 2015-07-05 LAB — SURGICAL PATHOLOGY

## 2015-07-08 ENCOUNTER — Ambulatory Visit
Admission: RE | Admit: 2015-07-08 | Discharge: 2015-07-08 | Disposition: A | Discharge status: Home / Self Care | Payer: Medicare (Managed Care) | Source: Ambulatory Visit | Attending: Family | Admitting: Family

## 2015-07-08 DIAGNOSIS — Z1231 Encounter for screening mammogram for malignant neoplasm of breast: Secondary | ICD-10-CM | POA: Insufficient documentation

## 2015-07-16 ENCOUNTER — Other Ambulatory Visit: Payer: Self-pay | Admitting: Vascular Surgery

## 2015-07-19 ENCOUNTER — Encounter
Admission: RE | Disposition: A | Discharge status: Home / Self Care | Payer: Self-pay | Source: Ambulatory Visit | Attending: Vascular Surgery

## 2015-07-19 ENCOUNTER — Encounter: Payer: Self-pay | Admitting: *Deleted

## 2015-07-19 ENCOUNTER — Ambulatory Visit
Admission: RE | Admit: 2015-07-19 | Discharge: 2015-07-19 | Disposition: A | Discharge status: Home / Self Care | Payer: Medicare (Managed Care) | Source: Ambulatory Visit | Attending: Vascular Surgery | Admitting: Vascular Surgery

## 2015-07-19 DIAGNOSIS — Z992 Dependence on renal dialysis: Secondary | ICD-10-CM | POA: Insufficient documentation

## 2015-07-19 DIAGNOSIS — I70202 Unspecified atherosclerosis of native arteries of extremities, left leg: Secondary | ICD-10-CM | POA: Diagnosis not present

## 2015-07-19 DIAGNOSIS — Z89611 Acquired absence of right leg above knee: Secondary | ICD-10-CM | POA: Insufficient documentation

## 2015-07-19 DIAGNOSIS — Y832 Surgical operation with anastomosis, bypass or graft as the cause of abnormal reaction of the patient, or of later complication, without mention of misadventure at the time of the procedure: Secondary | ICD-10-CM | POA: Diagnosis not present

## 2015-07-19 DIAGNOSIS — Z79899 Other long term (current) drug therapy: Secondary | ICD-10-CM | POA: Diagnosis not present

## 2015-07-19 DIAGNOSIS — G458 Other transient cerebral ischemic attacks and related syndromes: Secondary | ICD-10-CM | POA: Diagnosis not present

## 2015-07-19 DIAGNOSIS — T82858A Stenosis of vascular prosthetic devices, implants and grafts, initial encounter: Secondary | ICD-10-CM | POA: Insufficient documentation

## 2015-07-19 DIAGNOSIS — R234 Changes in skin texture: Secondary | ICD-10-CM | POA: Insufficient documentation

## 2015-07-19 DIAGNOSIS — E785 Hyperlipidemia, unspecified: Secondary | ICD-10-CM | POA: Diagnosis not present

## 2015-07-19 DIAGNOSIS — I120 Hypertensive chronic kidney disease with stage 5 chronic kidney disease or end stage renal disease: Secondary | ICD-10-CM | POA: Insufficient documentation

## 2015-07-19 DIAGNOSIS — N186 End stage renal disease: Secondary | ICD-10-CM | POA: Diagnosis not present

## 2015-07-19 DIAGNOSIS — E1122 Type 2 diabetes mellitus with diabetic chronic kidney disease: Secondary | ICD-10-CM | POA: Insufficient documentation

## 2015-07-19 DIAGNOSIS — Z7982 Long term (current) use of aspirin: Secondary | ICD-10-CM | POA: Diagnosis not present

## 2015-07-19 HISTORY — PX: PERIPHERAL VASCULAR CATHETERIZATION: SHX172C

## 2015-07-19 HISTORY — DX: Unspecified osteoarthritis, unspecified site: M19.90

## 2015-07-19 LAB — GLUCOSE, CAPILLARY: Glucose-Capillary: 91 mg/dL (ref 65–99)

## 2015-07-19 LAB — POTASSIUM (ARMC VASCULAR LAB ONLY): Potassium (ARMC vascular lab): 3.5 (ref 3.5–5.1)

## 2015-07-19 SURGERY — A/V SHUNTOGRAM/FISTULAGRAM
Anesthesia: Moderate Sedation

## 2015-07-19 MED ORDER — METOPROLOL TARTRATE 1 MG/ML IV SOLN: 2.0000 mg | INTRAVENOUS | Status: DC | PRN

## 2015-07-19 MED ORDER — ACETAMINOPHEN 325 MG RE SUPP: 325.0000 mg | RECTAL | Status: DC | PRN

## 2015-07-19 MED ORDER — ONDANSETRON HCL 4 MG/2ML IJ SOLN
4.0000 mg | Freq: Four times a day (QID) | INTRAMUSCULAR | Status: DC | PRN
Start: 2015-07-19 — End: 2015-07-19

## 2015-07-19 MED ORDER — MIDAZOLAM HCL 5 MG/5ML IJ SOLN
INTRAMUSCULAR | Status: AC
  Filled 2015-07-19: qty 5

## 2015-07-19 MED ORDER — OXYCODONE-ACETAMINOPHEN 5-325 MG PO TABS: 1.0000 | ORAL_TABLET | ORAL | Status: DC | PRN

## 2015-07-19 MED ORDER — ALUM & MAG HYDROXIDE-SIMETH 200-200-20 MG/5ML PO SUSP: 15.0000 mL | ORAL | Status: DC | PRN

## 2015-07-19 MED ORDER — HEPARIN SODIUM (PORCINE) 1000 UNIT/ML IJ SOLN
INTRAMUSCULAR | Status: AC
  Filled 2015-07-19: qty 1

## 2015-07-19 MED ORDER — IOHEXOL 300 MG/ML  SOLN
INTRAMUSCULAR | Status: DC | PRN
  Administered 2015-07-19: 110 mL via INTRAVENOUS

## 2015-07-19 MED ORDER — ALTEPLASE 2 MG IJ SOLR
INTRAMUSCULAR | Status: DC | PRN
  Administered 2015-07-19: 4 mg

## 2015-07-19 MED ORDER — LABETALOL HCL 5 MG/ML IV SOLN: 10.0000 mg | INTRAVENOUS | Status: DC | PRN

## 2015-07-19 MED ORDER — ONDANSETRON HCL 4 MG/2ML IJ SOLN: 4.0000 mg | Freq: Four times a day (QID) | INTRAMUSCULAR | Status: DC | PRN

## 2015-07-19 MED ORDER — STERILE WATER FOR INJECTION IJ SOLN
INTRAMUSCULAR | Status: AC
  Filled 2015-07-19: qty 20

## 2015-07-19 MED ORDER — FAMOTIDINE 20 MG PO TABS: 40.0000 mg | ORAL_TABLET | ORAL | Status: DC | PRN

## 2015-07-19 MED ORDER — HEPARIN (PORCINE) IN NACL 2-0.9 UNIT/ML-% IJ SOLN
INTRAMUSCULAR | Status: AC
  Filled 2015-07-19: qty 1500

## 2015-07-19 MED ORDER — METHYLPREDNISOLONE SODIUM SUCC 125 MG IJ SOLR: 125.0000 mg | INTRAMUSCULAR | Status: DC | PRN

## 2015-07-19 MED ORDER — HYDROMORPHONE HCL 1 MG/ML IJ SOLN: 1.0000 mg | Freq: Once | INTRAMUSCULAR | Status: DC

## 2015-07-19 MED ORDER — LIDOCAINE-EPINEPHRINE (PF) 1 %-1:200000 IJ SOLN
INTRAMUSCULAR | Status: AC
  Filled 2015-07-19: qty 30

## 2015-07-19 MED ORDER — MORPHINE SULFATE (PF) 4 MG/ML IV SOLN: 2.0000 mg | INTRAVENOUS | Status: DC | PRN

## 2015-07-19 MED ORDER — PHENOL 1.4 % MT LIQD
1.0000 | OROMUCOSAL | Status: DC | PRN
Start: 2015-07-19 — End: 2015-07-19

## 2015-07-19 MED ORDER — HYDROCODONE-ACETAMINOPHEN 5-325 MG PO TABS: 1.0000 | ORAL_TABLET | Freq: Four times a day (QID) | ORAL | Status: DC | PRN

## 2015-07-19 MED ORDER — GUAIFENESIN-DM 100-10 MG/5ML PO SYRP: 15.0000 mL | ORAL_SOLUTION | ORAL | Status: DC | PRN

## 2015-07-19 MED ORDER — ALTEPLASE 2 MG IJ SOLR
INTRAMUSCULAR | Status: AC
  Filled 2015-07-19: qty 4

## 2015-07-19 MED ORDER — ACETAMINOPHEN 325 MG PO TABS: 325.0000 mg | ORAL_TABLET | ORAL | Status: DC | PRN

## 2015-07-19 MED ORDER — FENTANYL CITRATE (PF) 100 MCG/2ML IJ SOLN
INTRAMUSCULAR | Status: AC
  Filled 2015-07-19: qty 4

## 2015-07-19 MED ORDER — HEPARIN SODIUM (PORCINE) 1000 UNIT/ML IJ SOLN
INTRAMUSCULAR | Status: DC | PRN
  Administered 2015-07-19: 4000 [IU] via INTRAVENOUS

## 2015-07-19 MED ORDER — FENTANYL CITRATE (PF) 100 MCG/2ML IJ SOLN
INTRAMUSCULAR | Status: DC | PRN
  Administered 2015-07-19: 25 ug via INTRAVENOUS
  Administered 2015-07-19: 50 ug via INTRAVENOUS

## 2015-07-19 MED ORDER — SODIUM CHLORIDE 0.9 % IV SOLN: INTRAVENOUS | Status: DC

## 2015-07-19 MED ORDER — HYDRALAZINE HCL 20 MG/ML IJ SOLN: 5.0000 mg | INTRAMUSCULAR | Status: DC | PRN

## 2015-07-19 MED ORDER — DEXTROSE 5 % IV SOLN
1.5000 g | INTRAVENOUS | Status: AC
  Administered 2015-07-19: 1.5 g via INTRAVENOUS

## 2015-07-19 MED ORDER — MIDAZOLAM HCL 2 MG/2ML IJ SOLN
INTRAMUSCULAR | Status: DC | PRN
  Administered 2015-07-19: 2 mg via INTRAVENOUS
  Administered 2015-07-19: 1 mg via INTRAVENOUS

## 2015-07-19 SURGICAL SUPPLY — 21 items
BALLN ARMADA 8X60X80 (BALLOONS) ×4
BALLN DORADO 6X60X80 (BALLOONS) ×4
BALLOON ARMADA 7X100X80 (BALLOONS) ×4 IMPLANT
BALLOON ARMADA 8X60X80 (BALLOONS) ×2 IMPLANT
BALLOON DORADO 6X60X80 (BALLOONS) ×2 IMPLANT
CANNULA 5F STIFF (CANNULA) ×4 IMPLANT
CATH EMBOLECTOMY 5FR (BALLOONS) ×4 IMPLANT
CATH TORCON 5FR 0.38 (CATHETERS) ×4 IMPLANT
DEVICE PRESTO INFLATION (MISCELLANEOUS) ×4 IMPLANT
DRAPE BRACHIAL (DRAPES) ×4 IMPLANT
PACK ANGIOGRAPHY (CUSTOM PROCEDURE TRAY) ×4 IMPLANT
SET AVX THROMB ULT (MISCELLANEOUS) ×4 IMPLANT
SHEATH BRITE TIP 6FRX5.5 (SHEATH) ×8 IMPLANT
STENT VIABAHN 6X50X120 (Permanent Stent) ×4 IMPLANT
STENT VIABAHN 7X50X120 (Permanent Stent) ×2 IMPLANT
STENT VIABAHN 7X5X120 7FR (Permanent Stent) ×2 IMPLANT
STENT VIABAHN 8X150X120 (Permanent Stent) ×2 IMPLANT
STENT VIABAHN 8X15X120 7FR (Permanent Stent) ×2 IMPLANT
STENT VIABAHN 8X50X120 (Permanent Stent) ×8 IMPLANT
TOWEL OR 17X26 4PK STRL BLUE (TOWEL DISPOSABLE) ×4 IMPLANT
WIRE MAGIC TORQUE 260C (WIRE) ×4 IMPLANT

## 2015-07-19 NOTE — Op Note (Signed)
[facility]    OPERATIVE NOTE   PROCEDURE: 1.  Left thigh arteriovenous graft cannulation under ultrasound guidance in both a retrograde and then antegrade fashion crossing 2.  Left leg shuntogram and central venogram 3.  Catheter directed thrombolysis with 4 mg of TPA delivered with the AngioJet AVX catheter 4.  Fogarty embolectomy for residual arterial plug 5.  Percutaneous transluminal angioplasty of proximal superficial femoral artery and arterial anastomosis with 6 mm diameter high pressure angioplasty balloon 6.  Percutaneous transluminal angioplasty of mid graft and venous anastomosis with 7 mm diameter angioplasty balloon 7.  Viabahn stent placement to the proximal SFA and initial portions of the graft including the arterial anastomosis with a 6 mm diameter by 5 cm length and then a 7 mm diameter by 5 cm length Viabahn stent 8.  Viabahn stent placement to the mid and distal graft as well as the venous anastomosis for multiple areas of greater than 50% residual stenosis and extravasation after angioplasty 9.  Catheter placement into left common iliac artery for evaluation of arterial inflow and left lower extremity perfusion from retrograde sheath and angiogram of the left iliac and femoral arteries.  PRE-OPERATIVE DIAGNOSIS: 1. ESRD 2.  Thrombosed left thigh arteriovenous graft 3.  Severe PAD 4. S/p BLE amputations  POST-OPERATIVE DIAGNOSIS: same as above   SURGEON: [name], MD  ANESTHESIA: local with Moderate Conscious Sedation for approximately 60 minutes using 3 mg of Versed and 75 mcg of Fentanyl  ESTIMATED BLOOD LOSS: 50 cc  FINDING(S): 1. SFA stenosis at and just before the anastmosis, multiple areas of in graft stenosis  SPECIMEN(S):  None  CONTRAST: 115 cc  FLUORO TIME:  15 minutes  INDICATIONS: Patient is a 65 y.o.female who presents with a thrombosed left thigh arteriovenous graft as well as severe PAD.  The patient is scheduled for  an attempted declot and shuntogram.  The patient is aware the risks include but are not limited to: bleeding, infection, thrombosis of the cannulated access, and possible anaphylactic reaction to the contrast.  The patient is aware of the risks of the procedure and elects to proceed forward.  DESCRIPTION: After full informed written consent was obtained, the patient was brought back to the angiography suite and placed supine upon the angiography table.  The patient was connected to monitoring equipment. Moderate conscious sedation was administered throughout the procedure with my supervision of the RN administering medicines and monitoring the patient's vital signs, pulse oximetry, telemetry and mental status throughout from the start of the procedure until the patient was taken to the recovery room. The left thigh was prepped and draped in the standard fashion for a percutaneous access intervention.  Under ultrasound guidance, the left thigh loop arteriovenous graft was cannulated with a micropuncture needle under direct ultrasound guidance due to the pulseless nature of the graft in both an antegrade and a retrograde fashion crossing, and permanent images were performed.  The microwire was advanced and the needle was exchanged for the a microsheath.  I then upsized to a 6 Fr Sheath and imaging was performed.  Hand injections were completed to image the access including the central venous system. This demonstrated no flow within the AV graft.  Based on the images, this patient will need extensive treatment to salvage the graft. I then gave the patient 4000 units of intravenous heparin.  I then placed a Magic torque wire into the femoral and then the iliac artery from the retrograde sheath to evaluate  the inflow and the proximal graft and into the iliac vein from the antegrade sheath. 4 mg of TPA were deployed throughout the entirety of the graft and into the iliac vein. This was allowed to dwell for 10-15  minutes.  This uncovered the graft off of the SFA with stenosis of the SFA just proximal to the graft and a significant narrowing in the midportion of the graft as well as near to the venous anastomosis.  A residual arterial plug was also seen at the arterial anastomosis. An attempt to clear the arterial plug was done with 2 passes of the Fogarty embolectomy balloon. Flow-limiting arterial plug remained, and I elected to treat this lesion with a 6 mm diameter angioplasty balloon inflated to 16 atm for one minute. The arterial outflow was seen to be intact through the profunda femorus as well on these images.   There was still stenosis in the SFA and at the arterial anastomosis and I elected to treat this with a stent and stay out of the profunda femoris artery. This was a very tight when no. I upsized to a 7 [place] sheath. The residual stenosis remained greater than 80%. The first stent was a 6 mm diameter by 5 cm length stent, but it withdrew into the graft and did not appropriately treat the lesion. A 7 mm diameter by 5 cm length stent was then deployed in the proximal portion of the graft across the arterial anastomosis and into the SFA just to its origin but not impinging upon the profunda femoris artery. This was postdilated with a 7 mm balloon with excellent inflow seen.  The retrograde sheath was removed. I then turned my attention to the issuesn the distal graft and the  femoral vein.  angioplasty was performed with a 7 mm diameter angioplasty balloon in the mid graft, distal graft, and across the venous anastomosis  This resulted in in essentially no flow within the graft with extravasation seen and narrowing present..  I then elected to treat this with  covered stents. An 8 mm diameter by 15 cm length stent was deployed initially, but and extension both proximally and distally was required to get across the venous anastomosis and get back to the proximal to mid graft and encompassed the lesions. These were  then postdilated with an 8 mm balloon with excellent angiographic completion result and no significant residual stenosis..    Based on the completion imaging, no further intervention is necessary.  The wire and balloon were removed from the sheath.  A 4-0 Monocryl purse-string suture was sewn around the sheath.  The sheath was removed while tying down the suture.  A sterile bandage was applied to the puncture site.  COMPLICATIONS: None  CONDITION: Stable   DEW,JASON 07/19/2015 4:20 PM

## 2015-07-19 NOTE — H&P (Signed)
  [facility] History & Physical Update  The patient was interviewed and re-examined.  The patient's previous History and Physical has been reviewed and is unchanged.  There is no change in the plan of care. We plan to proceed with the scheduled procedure.  Sederick Jacobsen, MD  07/19/2015, 2:04 PM

## 2015-07-19 NOTE — Discharge Instructions (Signed)
Fistulogram, Care After °Refer to this sheet in the next few weeks. These instructions provide you with information on caring for yourself after your procedure. Your health care provider may also give you more specific instructions. Your treatment has been planned according to current medical practices, but problems sometimes occur. Call your health care provider if you have any problems or questions after your procedure. °WHAT TO EXPECT AFTER THE PROCEDURE °After your procedure, it is typical to have the following: °· A small amount of discomfort in the area where the catheters were placed. °· A small amount of bruising around the fistula. °· Sleepiness and fatigue. °HOME CARE INSTRUCTIONS °· Rest at home for the day following your procedure. °· Do not drive or operate heavy machinery while taking pain medicine. °· Take medicines only as directed by your health care provider. °· Do not take baths, swim, or use a hot tub until your health care provider approves. You may shower 24 hours after the procedure or as directed by your health care provider. °· There are many different ways to close and cover an incision, including stitches, skin glue, and adhesive strips. Follow your health care provider's instructions on: °¨ Incision care. °¨ Bandage (dressing) changes and removal. °¨ Incision closure removal. °· Monitor your dialysis fistula carefully. °SEEK MEDICAL CARE IF: °· You have drainage, redness, swelling, or pain at your catheter site. °· You have a fever. °· You have chills. °SEEK IMMEDIATE MEDICAL CARE IF: °· You feel weak. °· You have trouble balancing. °· You have trouble moving your arms or legs. °· You have problems with your speech or vision. °· You can no longer feel a vibration or buzz when you put your fingers over your dialysis fistula. °· The limb that was used for the procedure: °¨ Swells. °¨ Is painful. °¨ Is cold. °¨ Is discolored, such as blue or pale white. °  °This information is not intended  to replace advice given to you by your health care provider. Make sure you discuss any questions you have with your health care provider. °  °Document Released: 10/20/2013 Document Reviewed: 10/20/2013 °Elsevier Interactive Patient Education ©2016 [employer] ° °

## 2015-07-20 ENCOUNTER — Encounter: Payer: Self-pay | Admitting: Vascular Surgery

## 2015-07-21 ENCOUNTER — Encounter: Payer: Self-pay | Admitting: Vascular Surgery

## 2015-08-23 ENCOUNTER — Other Ambulatory Visit: Payer: Self-pay | Admitting: Vascular Surgery

## 2015-08-26 ENCOUNTER — Encounter
Admission: RE | Disposition: A | Discharge status: Home / Self Care | Payer: Self-pay | Source: Ambulatory Visit | Attending: Vascular Surgery

## 2015-08-26 ENCOUNTER — Ambulatory Visit
Admission: RE | Admit: 2015-08-26 | Discharge: 2015-08-26 | Disposition: A | Discharge status: Home / Self Care | Payer: Medicare (Managed Care) | Source: Ambulatory Visit | Attending: Vascular Surgery | Admitting: Vascular Surgery

## 2015-08-26 DIAGNOSIS — Z89611 Acquired absence of right leg above knee: Secondary | ICD-10-CM | POA: Insufficient documentation

## 2015-08-26 DIAGNOSIS — Z992 Dependence on renal dialysis: Secondary | ICD-10-CM | POA: Diagnosis not present

## 2015-08-26 DIAGNOSIS — E1122 Type 2 diabetes mellitus with diabetic chronic kidney disease: Secondary | ICD-10-CM | POA: Diagnosis not present

## 2015-08-26 DIAGNOSIS — Z9889 Other specified postprocedural states: Secondary | ICD-10-CM | POA: Insufficient documentation

## 2015-08-26 DIAGNOSIS — Z452 Encounter for adjustment and management of vascular access device: Secondary | ICD-10-CM | POA: Diagnosis not present

## 2015-08-26 DIAGNOSIS — E785 Hyperlipidemia, unspecified: Secondary | ICD-10-CM | POA: Diagnosis not present

## 2015-08-26 DIAGNOSIS — I120 Hypertensive chronic kidney disease with stage 5 chronic kidney disease or end stage renal disease: Secondary | ICD-10-CM | POA: Diagnosis not present

## 2015-08-26 DIAGNOSIS — I739 Peripheral vascular disease, unspecified: Secondary | ICD-10-CM | POA: Diagnosis not present

## 2015-08-26 DIAGNOSIS — N186 End stage renal disease: Secondary | ICD-10-CM | POA: Insufficient documentation

## 2015-08-26 DIAGNOSIS — Z89512 Acquired absence of left leg below knee: Secondary | ICD-10-CM | POA: Diagnosis not present

## 2015-08-26 DIAGNOSIS — M1990 Unspecified osteoarthritis, unspecified site: Secondary | ICD-10-CM | POA: Diagnosis not present

## 2015-08-26 HISTORY — PX: PERIPHERAL VASCULAR CATHETERIZATION: SHX172C

## 2015-08-26 SURGERY — DIALYSIS/PERMA CATHETER REMOVAL
Anesthesia: Moderate Sedation

## 2015-08-26 MED ORDER — LIDOCAINE-EPINEPHRINE (PF) 1 %-1:200000 IJ SOLN
INTRAMUSCULAR | Status: DC | PRN
  Administered 2015-08-26: 20 mL via INTRADERMAL

## 2015-08-26 MED ORDER — LIDOCAINE-EPINEPHRINE (PF) 1 %-1:200000 IJ SOLN
INTRAMUSCULAR | Status: AC
  Filled 2015-08-26: qty 30

## 2015-08-26 SURGICAL SUPPLY — 4 items
FCP FG STRG 5.5XNS LF DISP (INSTRUMENTS) ×1
FORCEPS FG STRG 5.5XNS LF DISP (INSTRUMENTS) ×1 IMPLANT
FORCEPS KELLY 5.5 STR (INSTRUMENTS) ×2
TRAY LACERAT/PLASTIC (MISCELLANEOUS) ×3 IMPLANT

## 2015-08-26 NOTE — Op Note (Signed)
Operative Note     Preoperative diagnosis:   1. ESRD with functional permanent access  Postoperative diagnosis:  1. ESRD with functional permanent access  Procedure:  Removal of right jugular Permcath  Surgeon:  [name], MD  Anesthesia:  Local  EBL:  Minimal  Indication for the Procedure:  The patient has a functional permanent dialysis access and no longer needs their permcath.  This can be removed.  Risks and benefits are discussed and informed consent is obtained.  Description of the Procedure:  The patient's right neck, chest and existing catheter were sterilely prepped and draped. The area around the catheter was anesthetized copiously with 1% lidocaine. The catheter was dissected out with curved hemostats until the cuff was freed from the surrounding fibrous sheath. The fiber sheath was transected, and the catheter was then removed in its entirety using gentle traction. Pressure was held and sterile dressings were placed. The patient tolerated the procedure well and was taken to the recovery room in stable condition.     Ly Wass  08/26/2015, 12:28 PM

## 2015-08-26 NOTE — H&P (Signed)
[facility] Admission History & Physical  MRN : [id number]  Amy Wu is a 65 y.o. (11-29-1950) female who presents with chief complaint of No chief complaint on file. Marland Kitchen  History of Present Illness: Patient well known to our service for difficult dialysis access and ESRD.  Left thigh AVG now working for her dialysis and her catheter is no longer needed.  She is well today without complaints.  No fever or chills or signs of systemic infections.   No current facility-administered medications for this encounter.    Past Medical History  Diagnosis Date  . Hypertension   . Diabetes mellitus without complication ([place])   . Peripheral vascular disease ([place])   . Chronic kidney disease   . Hyperlipidemia   . Arthritis     Past Surgical History  Procedure Laterality Date  . Leg amputation above knee Right   . Below knee leg amputation Left   . Peripheral vascular catheterization N/A 02/11/2015    Procedure: Dialysis/Perma Catheter Insertion;  Surgeon: Algernon Huxley, MD;  Location: [facility];  Service: Cardiovascular;  Laterality: N/A;  . Exchange of a dialysis catheter  02/11/2015    Procedure: Exchange Of A Dialysis Catheter;  Surgeon: Algernon Huxley, MD;  Location: [facility];  Service: Cardiovascular;;  . Av fistula placement Bilateral   . Peripheral vascular catheterization N/A 05/11/2015    Procedure: Dialysis/Perma Catheter Insertion, exchange;  Surgeon: Katha Cabal, MD;  Location: [facility];  Service: Cardiovascular;  Laterality: N/A;  . Peripheral vascular catheterization N/A 05/18/2015    Procedure: Dialysis/Perma Catheter Insertion;  Surgeon: Katha Cabal, MD;  Location: [facility];  Service: Cardiovascular;  Laterality: N/A;  . Av fistula placement Left 07/01/2015    Procedure: INSERTION OF ARTERIOVENOUS (AV) GRAFT THIGH (ARTEGRAFT);  Surgeon: Algernon Huxley, MD;  Location: [facility];  Service: Vascular;   Laterality: Left;  . Endarterectomy femoral Left 07/01/2015    Procedure: SUPERFICIAL FEMORAL ENDARTERECTOMY ;  Surgeon: Algernon Huxley, MD;  Location: [facility];  Service: Vascular;  Laterality: Left;  . Peripheral vascular catheterization Left 07/19/2015    Procedure: A/V Shuntogram/Fistulagram;  Surgeon: Algernon Huxley, MD;  Location: [facility];  Service: Cardiovascular;  Laterality: Left;  . Peripheral vascular catheterization N/A 07/19/2015    Procedure: A/V Shunt Intervention;  Surgeon: Algernon Huxley, MD;  Location: [facility];  Service: Cardiovascular;  Laterality: N/A;    Social History Social History  Substance Use Topics  . Smoking status: Never Smoker   . Smokeless tobacco: Not on file  . Alcohol Use: No  No IVDU.  Married  Family History Family History  Problem Relation Age of Onset  . Breast cancer Neg Hx   no bleeding disorders or clotting disorders, no autoimmune diseases  No Known Allergies   REVIEW OF SYSTEMS (Negative unless checked)  Constitutional: [] Weight loss  [] Fever  [] Chills Cardiac: [] Chest pain   [] Chest pressure   [] Palpitations   [] Shortness of breath when laying flat   [] Shortness of breath at rest   [] Shortness of breath with exertion. Vascular:  [] Pain in legs with walking   [] Pain in legs at rest   [] Pain in legs when laying flat   [] Claudication   [] Pain in feet when walking  [] Pain in feet at rest  [] Pain in feet when laying flat   [] History of DVT   [] Phlebitis   [] Swelling in  legs   [] Varicose veins   [] Non-healing ulcers Pulmonary:   [] Uses home oxygen   [] Productive cough   [] Hemoptysis   [] Wheeze  [] COPD   [] Asthma Neurologic:  [] Dizziness  [] Blackouts   [] Seizures   [] History of stroke   [] History of TIA  [] Aphasia   [] Temporary blindness   [] Dysphagia   [] Weakness or numbness in arms   [] Weakness or numbness in legs Musculoskeletal:  [] Arthritis   [] Joint swelling   [] Joint pain   [] Low back pain Hematologic:  [] Easy bruising   [] Easy bleeding   [] Hypercoagulable state   [] Anemic  [] Hepatitis Gastrointestinal:  [] Blood in stool   [] Vomiting blood  [] Gastroesophageal reflux/heartburn   [] Difficulty swallowing. Genitourinary:  [x] Chronic kidney disease   [] Difficult urination  [] Frequent urination  [] Burning with urination   [] Blood in urine Skin:  [] Rashes   [] Ulcers   [] Wounds Psychological:  [] History of anxiety   []  History of major depression.  Physical Examination  There were no vitals filed for this visit. There is no weight on file to calculate BMI. Gen: WD/WN, NAD Head: [place]/AT, No temporalis wasting. Prominent temp pulse not noted. Ear/Nose/Throat: Hearing grossly intact, nares w/o erythema or drainage, oropharynx w/o Erythema/Exudate,  Eyes: PERRLA, EOMI.  Neck: Supple, no nuchal rigidity.  No JVD.  Pulmonary:  Good air movement, no use of accessory muscles.  Cardiac: RRR, normal S1, S2 Vascular: bruit/thrill in AVG left thigh. permcath present in the chest Vessel Right Left  Radial Palpable Palpable  Ulnar Palpable Palpable  Brachial Palpable Palpable  Carotid Palpable, without bruit Palpable, without bruit  Aorta Not palpable N/A  Femoral Palpable Palpable  Popliteal Not Palpable Not Palpable  PT Not Palpable Not Palpable  DP Not Palpable Not Palpable   Gastrointestinal: soft, non-tender/non-distended. No guarding/reflex.  Musculoskeletal: Right AKA, Left BKA Neurologic: CN 2-12 intact. Pain and light touch intact in extremities.  Symmetrical.  Speech is fluent.  Psychiatric: Judgment intact, Mood & affect appropriate for pt's clinical situation. Dermatologic: No rashes or ulcers noted.  No cellulitis or open wounds. Lymph : No Cervical, Axillary, or Inguinal lymphadenopathy.   CBC Lab Results  Component Value Date   WBC 9.9 06/22/2015   HGB 15.3* 07/01/2015   HCT 45.0 07/01/2015   MCV 78.7* 06/22/2015   PLT 169 06/22/2015    BMET    Component Value Date/Time   NA 139 07/01/2015  1103   NA 138 12/03/2012 1414   K 4.9 07/01/2015 1103   K 4.5 12/03/2012 1414   CL 102 06/22/2015 1606   CL 101 12/03/2012 1414   CO2 27 06/22/2015 1606   CO2 27 12/03/2012 1414   GLUCOSE 124* 07/01/2015 1103   GLUCOSE 326* 12/03/2012 1414   BUN 38* 06/22/2015 1606   BUN 37* 12/03/2012 1414   CREATININE 7.47* 06/22/2015 1606   CREATININE 7.04* 12/03/2012 1414   CALCIUM 9.5 06/22/2015 1606   CALCIUM 8.4* 12/03/2012 1414   GFRNONAA 5* 06/22/2015 1606   GFRNONAA 6* 12/03/2012 1414   GFRNONAA 10* 07/26/2011 1643   GFRAA 6* 06/22/2015 1606   GFRAA 7* 12/03/2012 1414   GFRAA 13* 07/26/2011 1643   CrCl cannot be calculated (Unknown ideal weight.).  COAG Lab Results  Component Value Date   INR 1.11 06/22/2015   INR 1.0 07/26/2011    Radiology No results found.   Assessment/Plan 1. ESRD. Graft now working.  Can remove catheter.  Plan to do so today 2. PAD. S/p bilateral LE amputations. Stable.  No ulcers  3. DM. Stable on outpatient medications.   DEW,JASON, MD  08/26/2015 11:28 AM

## 2015-08-27 ENCOUNTER — Encounter: Payer: Self-pay | Admitting: Vascular Surgery

## 2016-06-16 ENCOUNTER — Encounter (INDEPENDENT_AMBULATORY_CARE_PROVIDER_SITE_OTHER): Payer: Medicare (Managed Care)

## 2016-06-16 ENCOUNTER — Ambulatory Visit (INDEPENDENT_AMBULATORY_CARE_PROVIDER_SITE_OTHER): Payer: Self-pay | Admitting: Vascular Surgery

## 2016-06-20 ENCOUNTER — Other Ambulatory Visit (INDEPENDENT_AMBULATORY_CARE_PROVIDER_SITE_OTHER): Payer: Self-pay | Admitting: Vascular Surgery

## 2016-06-20 ENCOUNTER — Encounter (INDEPENDENT_AMBULATORY_CARE_PROVIDER_SITE_OTHER): Payer: Self-pay | Admitting: Vascular Surgery

## 2016-06-20 ENCOUNTER — Ambulatory Visit (INDEPENDENT_AMBULATORY_CARE_PROVIDER_SITE_OTHER): Payer: Medicare (Managed Care) | Admitting: Vascular Surgery

## 2016-06-20 ENCOUNTER — Other Ambulatory Visit (INDEPENDENT_AMBULATORY_CARE_PROVIDER_SITE_OTHER): Payer: Medicare (Managed Care)

## 2016-06-20 VITALS — BP 110/56 | HR 83 | Resp 16 | Ht <= 58 in | Wt 202.0 lb

## 2016-06-20 DIAGNOSIS — N186 End stage renal disease: Secondary | ICD-10-CM

## 2016-06-20 DIAGNOSIS — Z992 Dependence on renal dialysis: Secondary | ICD-10-CM | POA: Diagnosis not present

## 2016-06-20 DIAGNOSIS — E119 Type 2 diabetes mellitus without complications: Secondary | ICD-10-CM | POA: Insufficient documentation

## 2016-06-20 DIAGNOSIS — Z794 Long term (current) use of insulin: Secondary | ICD-10-CM | POA: Diagnosis not present

## 2016-06-20 DIAGNOSIS — E785 Hyperlipidemia, unspecified: Secondary | ICD-10-CM | POA: Insufficient documentation

## 2016-06-20 DIAGNOSIS — E118 Type 2 diabetes mellitus with unspecified complications: Secondary | ICD-10-CM | POA: Diagnosis not present

## 2016-06-20 DIAGNOSIS — G2581 Restless legs syndrome: Secondary | ICD-10-CM

## 2016-06-20 NOTE — Assessment & Plan Note (Signed)
blood glucose control important in reducing the progression of atherosclerotic disease. Also, involved in wound healing. On appropriate medications.  

## 2016-06-20 NOTE — Progress Notes (Signed)
MRN : [id number]  Amy Wu is a 66 y.o. ([birth year]) female who presents with chief complaint of  Chief Complaint  Patient presents with  . Re-evaluation    Ultrasound follow up  .  History of Present Illness: Patient returns today in follow up of ESRD and difficult dialysis access. She has a left thigh AV graft which has been present for about a year now. It is currently working okay. It required interventions shortly after placement, but none recently. She has very limited access options and is already status post amputations bilaterally. Her duplex today shows a patent AV graft with a patent stent in place.    Current Outpatient Prescriptions  Medication Sig Dispense Refill  . acetaminophen (TYLENOL) 500 MG tablet Take 1,000 mg by mouth every 8 (eight) hours as needed for moderate pain.    Marland Kitchen albuterol (PROVENTIL) (2.5 MG/3ML) 0.083% nebulizer solution Take 2.5 mg by nebulization every 6 (six) hours as needed for wheezing or shortness of breath. Every 4 to 6 hours as needed    . Amino Acids (LIQUACEL) LIQD Take 1 oz by mouth daily. Mix in 6 oz of fluids and drink    . aspirin 81 MG tablet Take 81 mg by mouth daily.    Marland Kitchen atorvastatin (LIPITOR) 40 MG tablet Take 40 mg by mouth daily.    . camphor-menthol (SARNA) lotion Apply 1 application topically as needed for itching. Twice a day if needed    . cholecalciferol (VITAMIN D) 1000 UNITS tablet Take 2,000 Units by mouth daily. Reported on 07/19/2015    . Cholecalciferol (VITAMIN D3) 2000 units TABS Take 2,000 Units by mouth daily.    . cinacalcet (SENSIPAR) 30 MG tablet Take 30 mg by mouth daily. Every other day    . ENSURE (ENSURE) Take 237 mLs by mouth daily.    Marland Kitchen glucose 4 GM chewable tablet Chew 1 tablet by mouth as needed for low blood sugar. If blood sugar less than 70. Chew 3 tablets and recheck blood sugar in 15 minutes    . HYDROcodone-acetaminophen (NORCO) 5-325 MG tablet Take 1 tablet by mouth every 6 (six) hours as needed  for moderate pain. 30 tablet 0  . HYDROcodone-acetaminophen (NORCO) 5-325 MG tablet Take 1 tablet by mouth every 6 (six) hours as needed for moderate pain. 30 tablet 0  . hydrOXYzine (ATARAX/VISTARIL) 25 MG tablet Take 25 mg by mouth daily. PRN for itching    . insulin aspart (NOVOLOG) 100 UNIT/ML injection Inject 1 Units into the skin 3 (three) times daily before meals. Pt uses insulin PRN for Blood Sugar greater then 200 uses 1 unit if greater then 200    . lanthanum (FOSRENOL) 500 MG chewable tablet Chew 500 mg by mouth 3 (three) times daily with meals.    Marland Kitchen liver oil-zinc oxide (DESITIN) 40 % ointment Apply 1 application topically as needed for irritation.    Marland Kitchen loperamide (IMODIUM A-D) 2 MG tablet Take 2 mg by mouth 4 (four) times daily as needed for diarrhea or loose stools. 2 tablets by mouth for onset of loose stool, than 1 tablet by mouth after each loose stool MAX 4 tablets in 24 hours    . loratadine (CLARITIN) 10 MG tablet Take 10 mg by mouth daily as needed for allergies.    Marland Kitchen nystatin (MYCOSTATIN) powder Apply 100,000 g topically 2 (two) times daily. 100.000unit/gm    . ondansetron (ZOFRAN-ODT) 4 MG disintegrating tablet Take 4 mg by mouth every 8 (  eight) hours as needed for nausea or vomiting.    Vladimir Faster Glycol-Propyl Glycol (SYSTANE) 0.4-0.3 % SOLN Apply to eye. 1 drop  Both eyes 4 times a day    . senna-docusate (SENOKOT-S) 8.6-50 MG tablet Take 1 tablet by mouth daily. 1-2 tablets at bedtime if needed    . Skin Protectants, Misc. (EUCERIN) cream Apply topically 4 (four) times daily.     No current facility-administered medications for this visit.     Past Medical History:  Diagnosis Date  . Arthritis   . Chronic kidney disease   . Diabetes mellitus without complication ([place])   . Hyperlipidemia   . Hypertension   . Peripheral vascular disease [facility])     Past Surgical History:  Procedure Laterality Date  . AV FISTULA PLACEMENT Bilateral   . AV FISTULA PLACEMENT Left  07/01/2015   Procedure: INSERTION OF ARTERIOVENOUS (AV) GRAFT THIGH (ARTEGRAFT);  Surgeon: Algernon Huxley, MD;  Location: [facility] ORS;  Service: Vascular;  Laterality: Left;  . BELOW KNEE LEG AMPUTATION Left   . ENDARTERECTOMY FEMORAL Left 07/01/2015   Procedure: SUPERFICIAL FEMORAL ENDARTERECTOMY ;  Surgeon: Algernon Huxley, MD;  Location: [facility] ORS;  Service: Vascular;  Laterality: Left;  . EXCHANGE OF A DIALYSIS CATHETER  02/11/2015   Procedure: Exchange Of A Dialysis Catheter;  Surgeon: Algernon Huxley, MD;  Location: [place] [facility] LAB;  Service: Cardiovascular;;  . LEG AMPUTATION ABOVE KNEE Right   . PERIPHERAL VASCULAR CATHETERIZATION N/A 02/11/2015   Procedure: Dialysis/Perma Catheter Insertion;  Surgeon: Algernon Huxley, MD;  Location: [place] CV LAB;  Service: Cardiovascular;  Laterality: N/A;  . PERIPHERAL VASCULAR CATHETERIZATION N/A 05/11/2015   Procedure: Dialysis/Perma Catheter Insertion, exchange;  Surgeon: Katha Cabal, MD;  Location: [place] CV LAB;  Service: Cardiovascular;  Laterality: N/A;  . PERIPHERAL VASCULAR CATHETERIZATION N/A 05/18/2015   Procedure: Dialysis/Perma Catheter Insertion;  Surgeon: Katha Cabal, MD;  Location: [place] CV LAB;  Service: Cardiovascular;  Laterality: N/A;  . PERIPHERAL VASCULAR CATHETERIZATION Left 07/19/2015   Procedure: A/V Shuntogram/Fistulagram;  Surgeon: Algernon Huxley, MD;  Location: [place] CV LAB;  Service: Cardiovascular;  Laterality: Left;  . PERIPHERAL VASCULAR CATHETERIZATION N/A 07/19/2015   Procedure: A/V Shunt Intervention;  Surgeon: Algernon Huxley, MD;  Location: [place] CV LAB;  Service: Cardiovascular;  Laterality: N/A;  . PERIPHERAL VASCULAR CATHETERIZATION N/A 08/26/2015   Procedure: Dialysis/Perma Catheter Removal;  Surgeon: Algernon Huxley, MD;  Location: [place] CV LAB;  Service: Cardiovascular;  Laterality: N/A;    Social History Social History  Substance Use Topics  . Smoking status: Never Smoker  .  Smokeless tobacco: Not on file  . Alcohol use No     Family History Family History  Problem Relation Age of Onset  . Breast cancer Neg Hx      No Known Allergies   REVIEW OF SYSTEMS (Negative unless checked)  Constitutional: [] Weight loss  [] Fever  [] Chills Cardiac: [] Chest pain   [] Chest pressure   [] Palpitations   [] Shortness of breath when laying flat   [] Shortness of breath at rest   [] Shortness of breath with exertion. Vascular:  [] Pain in legs with walking   [] Pain in legs at rest   [] Pain in legs when laying flat   [] Claudication   [] Pain in feet when walking  [] Pain in feet at rest  [] Pain in feet when laying flat   [] History of DVT   [] Phlebitis   [] Swelling in legs   []   Varicose veins   [] Non-healing ulcers Pulmonary:   [] Uses home oxygen   [] Productive cough   [] Hemoptysis   [] Wheeze  [] COPD   [] Asthma Neurologic:  [] Dizziness  [] Blackouts   [] Seizures   [] History of stroke   [] History of TIA  [] Aphasia   [] Temporary blindness   [] Dysphagia   [] Weakness or numbness in arms   [] Weakness or numbness in legs Musculoskeletal:  [x] Arthritis   [] Joint swelling   [] Joint pain   [] Low back pain Hematologic:  [] Easy bruising  [] Easy bleeding   [] Hypercoagulable state   [] Anemic   Gastrointestinal:  [] Blood in stool   [] Vomiting blood  [] Gastroesophageal reflux/heartburn   [] Abdominal pain Genitourinary:  [x] Chronic kidney disease   [] Difficult urination  [] Frequent urination  [] Burning with urination   [] Hematuria Skin:  [] Rashes   [] Ulcers   [] Wounds Psychological:  [] History of anxiety   []  History of major depression.  Physical Examination  BP (!) 110/56 (BP Location: Left Arm)   Pulse 83   Resp 16   Ht 4' 9.5" (1.461 m)   Wt 202 lb (91.6 kg)   BMI 42.96 kg/m  Gen:  WD/WN, NAD Head: [place]/AT, No temporalis wasting. Ear/Nose/Throat: Hearing grossly intact, nares w/o erythema or drainage, trachea midline Eyes: Conjunctiva clear. Sclera non-icteric Neck: Supple.  No JVD.    Pulmonary:  Good air movement, no use of accessory muscles.  Cardiac: RRR, normal S1, S2 Vascular: Thrill present and left thigh AV graft Vessel Right Left  Radial Palpable Palpable                  Femoral Palpable Palpable  Popliteal Not Palpable Not Palpable  PT Not Palpable Not Palpable  DP Not Palpable Not Palpable   Gastrointestinal: soft, non-tender/non-distended. No guarding/reflex.  Musculoskeletal: Right AKA, left BKA. He uses motorized wheelchair Neurologic: Sensation grossly intact in extremities.  Symmetrical.  Speech is fluent.  Psychiatric: Judgment intact, Mood & affect appropriate for pt's clinical situation. Dermatologic: No rashes or ulcers noted.  No cellulitis or open wounds. Lymph : No Cervical, Axillary, or Inguinal lymphadenopathy.      Labs No results found for this or any previous visit (from the past 2160 hour(s)).  Radiology No results found.    Assessment/Plan  Diabetes (HCC) blood glucose control important in reducing the progression of atherosclerotic disease. Also, involved in wound healing. On appropriate medications.   Hyperlipidemia lipid control important in reducing the progression of atherosclerotic disease. Continue statin therapy   ESRD on dialysis [facility]) The patient is doing well and her left thigh AV graft is currently working without major issues. She has very limited access options and is already status post bilateral lower extremity amputations, so our options for future access are very limited. Her duplex today shows a patent AV graft with a patent stent in place. She will continue to use this as her access. We will plan on checking it again in 6 months with noninvasive studies.  Restless leg syndrome, uncontrolled The patient voices problems with restless leg syndrome. She requests medication for that. Prescription for Requip called into her pharmacy.    [name], MD  06/20/2016 1:21 PM    This note was created  with Dragon medical transcription system.  Any errors from dictation are purely unintentional

## 2016-06-20 NOTE — Assessment & Plan Note (Signed)
The patient is doing well and her left thigh AV graft is currently working without major issues. She has very limited access options and is already status post bilateral lower extremity amputations, so our options for future access are very limited. Her duplex today shows a patent AV graft with a patent stent in place. She will continue to use this as her access. We will plan on checking it again in 6 months with noninvasive studies.

## 2016-06-20 NOTE — Assessment & Plan Note (Signed)
lipid control important in reducing the progression of atherosclerotic disease. Continue statin therapy  

## 2016-06-20 NOTE — Assessment & Plan Note (Signed)
The patient voices problems with restless leg syndrome. She requests medication for that. Prescription for Requip called into her pharmacy.

## 2016-06-23 ENCOUNTER — Ambulatory Visit (INDEPENDENT_AMBULATORY_CARE_PROVIDER_SITE_OTHER): Payer: Medicare (Managed Care) | Admitting: Vascular Surgery

## 2016-11-21 ENCOUNTER — Ambulatory Visit (INDEPENDENT_AMBULATORY_CARE_PROVIDER_SITE_OTHER): Payer: Medicare (Managed Care) | Admitting: Vascular Surgery

## 2016-11-21 ENCOUNTER — Encounter (INDEPENDENT_AMBULATORY_CARE_PROVIDER_SITE_OTHER): Payer: Self-pay | Admitting: Vascular Surgery

## 2016-11-21 ENCOUNTER — Ambulatory Visit (INDEPENDENT_AMBULATORY_CARE_PROVIDER_SITE_OTHER): Payer: Medicare (Managed Care)

## 2016-11-21 VITALS — BP 117/70 | HR 67 | Resp 16 | Wt 200.0 lb

## 2016-11-21 DIAGNOSIS — Z992 Dependence on renal dialysis: Secondary | ICD-10-CM

## 2016-11-21 DIAGNOSIS — Z794 Long term (current) use of insulin: Secondary | ICD-10-CM | POA: Diagnosis not present

## 2016-11-21 DIAGNOSIS — N186 End stage renal disease: Secondary | ICD-10-CM

## 2016-11-21 DIAGNOSIS — E118 Type 2 diabetes mellitus with unspecified complications: Secondary | ICD-10-CM | POA: Diagnosis not present

## 2016-11-21 DIAGNOSIS — E785 Hyperlipidemia, unspecified: Secondary | ICD-10-CM

## 2016-11-21 NOTE — Assessment & Plan Note (Signed)
Her duplex today shows a widely patent left thigh femoral artery to femoral vein graft without significant stenosis. Currently she is doing pretty well. She does have a very limited dialysis access situation and multiple interventions have already been performed on this access. We will continue to follow this on 6 month intervals, and I would be happy to see her back at any point prior to that if necessary.

## 2016-11-21 NOTE — Progress Notes (Signed)
MRN : [id number]  Amy Wu is a 66 y.o. (1950-09-11) female who presents with chief complaint of  Chief Complaint  Patient presents with  . Follow-up  .  History of Present Illness: Patient returns today in follow up of Dialysis access. She is doing quite well and does not have specific complaints today. She has been very diligent in making them rotate her access sites at the dialysis center, and this is clearly helped. Her access is working well without any major issues. Her duplex today shows a widely patent left thigh femoral artery to femoral vein graft without significant stenosis.         Current Outpatient Prescriptions  Medication Sig Dispense Refill  . acetaminophen (TYLENOL) 500 MG tablet Take 1,000 mg by mouth every 8 (eight) hours as needed for moderate pain.    Marland Kitchen albuterol (PROVENTIL) (2.5 MG/3ML) 0.083% nebulizer solution Take 2.5 mg by nebulization every 6 (six) hours as needed for wheezing or shortness of breath. Every 4 to 6 hours as needed    . Amino Acids (LIQUACEL) LIQD Take 1 oz by mouth daily. Mix in 6 oz of fluids and drink    . aspirin 81 MG tablet Take 81 mg by mouth daily.    Marland Kitchen atorvastatin (LIPITOR) 40 MG tablet Take 40 mg by mouth daily.    . camphor-menthol (SARNA) lotion Apply 1 application topically as needed for itching. Twice a day if needed    . cholecalciferol (VITAMIN D) 1000 UNITS tablet Take 2,000 Units by mouth daily. Reported on 07/19/2015    . Cholecalciferol (VITAMIN D3) 2000 units TABS Take 2,000 Units by mouth daily.    . cinacalcet (SENSIPAR) 30 MG tablet Take 30 mg by mouth daily. Every other day    . ENSURE (ENSURE) Take 237 mLs by mouth daily.    Marland Kitchen glucose 4 GM chewable tablet Chew 1 tablet by mouth as needed for low blood sugar. If blood sugar less than 70. Chew 3 tablets and recheck blood sugar in 15 minutes    . HYDROcodone-acetaminophen (NORCO) 5-325 MG tablet Take 1 tablet by mouth every 6 (six) hours as  needed for moderate pain. 30 tablet 0  . HYDROcodone-acetaminophen (NORCO) 5-325 MG tablet Take 1 tablet by mouth every 6 (six) hours as needed for moderate pain. 30 tablet 0  . hydrOXYzine (ATARAX/VISTARIL) 25 MG tablet Take 25 mg by mouth daily. PRN for itching    . insulin aspart (NOVOLOG) 100 UNIT/ML injection Inject 1 Units into the skin 3 (three) times daily before meals. Pt uses insulin PRN for Blood Sugar greater then 200 uses 1 unit if greater then 200    . lanthanum (FOSRENOL) 500 MG chewable tablet Chew 500 mg by mouth 3 (three) times daily with meals.    Marland Kitchen liver oil-zinc oxide (DESITIN) 40 % ointment Apply 1 application topically as needed for irritation.    Marland Kitchen loperamide (IMODIUM A-D) 2 MG tablet Take 2 mg by mouth 4 (four) times daily as needed for diarrhea or loose stools. 2 tablets by mouth for onset of loose stool, than 1 tablet by mouth after each loose stool MAX 4 tablets in 24 hours    . loratadine (CLARITIN) 10 MG tablet Take 10 mg by mouth daily as needed for allergies.    Marland Kitchen nystatin (MYCOSTATIN) powder Apply 100,000 g topically 2 (two) times daily. 100.000unit/gm    . ondansetron (ZOFRAN-ODT) 4 MG disintegrating tablet Take 4 mg by mouth every 8 (eight)  hours as needed for nausea or vomiting.    Vladimir Faster Glycol-Propyl Glycol (SYSTANE) 0.4-0.3 % SOLN Apply to eye. 1 drop  Both eyes 4 times a day    . senna-docusate (SENOKOT-S) 8.6-50 MG tablet Take 1 tablet by mouth daily. 1-2 tablets at bedtime if needed    . Skin Protectants, Misc. (EUCERIN) cream Apply topically 4 (four) times daily.     No current facility-administered medications for this visit.         Past Medical History:  Diagnosis Date  . Arthritis   . Chronic kidney disease   . Diabetes mellitus without complication ([place])   . Hyperlipidemia   . Hypertension   . Peripheral vascular disease [facility])          Past Surgical History:  Procedure Laterality Date  . AV FISTULA  PLACEMENT Bilateral   . AV FISTULA PLACEMENT Left 07/01/2015   Procedure: INSERTION OF ARTERIOVENOUS (AV) GRAFT THIGH (ARTEGRAFT);  Surgeon: Algernon Huxley, MD;  Location: [facility] ORS;  Service: Vascular;  Laterality: Left;  . BELOW KNEE LEG AMPUTATION Left   . ENDARTERECTOMY FEMORAL Left 07/01/2015   Procedure: SUPERFICIAL FEMORAL ENDARTERECTOMY ;  Surgeon: Algernon Huxley, MD;  Location: [facility] ORS;  Service: Vascular;  Laterality: Left;  . EXCHANGE OF A DIALYSIS CATHETER  02/11/2015   Procedure: Exchange Of A Dialysis Catheter;  Surgeon: Algernon Huxley, MD;  Location: [place] CV LAB;  Service: Cardiovascular;;  . LEG AMPUTATION ABOVE KNEE Right   . PERIPHERAL VASCULAR CATHETERIZATION N/A 02/11/2015   Procedure: Dialysis/Perma Catheter Insertion;  Surgeon: Algernon Huxley, MD;  Location: [place] CV LAB;  Service: Cardiovascular;  Laterality: N/A;  . PERIPHERAL VASCULAR CATHETERIZATION N/A 05/11/2015   Procedure: Dialysis/Perma Catheter Insertion, exchange;  Surgeon: Katha Cabal, MD;  Location: [place] CV LAB;  Service: Cardiovascular;  Laterality: N/A;  . PERIPHERAL VASCULAR CATHETERIZATION N/A 05/18/2015   Procedure: Dialysis/Perma Catheter Insertion;  Surgeon: Katha Cabal, MD;  Location: [place] CV LAB;  Service: Cardiovascular;  Laterality: N/A;  . PERIPHERAL VASCULAR CATHETERIZATION Left 07/19/2015   Procedure: A/V Shuntogram/Fistulagram;  Surgeon: Algernon Huxley, MD;  Location: [place] CV LAB;  Service: Cardiovascular;  Laterality: Left;  . PERIPHERAL VASCULAR CATHETERIZATION N/A 07/19/2015   Procedure: A/V Shunt Intervention;  Surgeon: Algernon Huxley, MD;  Location: [place] CV LAB;  Service: Cardiovascular;  Laterality: N/A;  . PERIPHERAL VASCULAR CATHETERIZATION N/A 08/26/2015   Procedure: Dialysis/Perma Catheter Removal;  Surgeon: Algernon Huxley, MD;  Location: [place] CV LAB;  Service: Cardiovascular;  Laterality: N/A;    Social History     Social  History  Substance Use Topics  . Smoking status: Never Smoker  . Smokeless tobacco: Not on file  . Alcohol use No     Family History      Family History  Problem Relation Age of Onset  . Breast cancer Neg Hx      No Known Allergies   REVIEW OF SYSTEMS (Negative unless checked)  Constitutional: [] Weight loss  [] Fever  [] Chills Cardiac: [] Chest pain   [] Chest pressure   [] Palpitations   [] Shortness of breath when laying flat   [] Shortness of breath at rest   [] Shortness of breath with exertion. Vascular:  [] Pain in legs with walking   [] Pain in legs at rest   [] Pain in legs when laying flat   [] Claudication   [] Pain in feet when walking  [] Pain in feet at rest  [] Pain in feet  when laying flat   [] History of DVT   [] Phlebitis   [] Swelling in legs   [] Varicose veins   [] Non-healing ulcers Pulmonary:   [] Uses home oxygen   [] Productive cough   [] Hemoptysis   [] Wheeze  [] COPD   [] Asthma Neurologic:  [] Dizziness  [] Blackouts   [] Seizures   [] History of stroke   [] History of TIA  [] Aphasia   [] Temporary blindness   [] Dysphagia   [] Weakness or numbness in arms   [] Weakness or numbness in legs Musculoskeletal:  [x] Arthritis   [] Joint swelling   [] Joint pain   [] Low back pain Hematologic:  [] Easy bruising  [] Easy bleeding   [] Hypercoagulable state   [] Anemic   Gastrointestinal:  [] Blood in stool   [] Vomiting blood  [] Gastroesophageal reflux/heartburn   [] Abdominal pain Genitourinary:  [x] Chronic kidney disease   [] Difficult urination  [] Frequent urination  [] Burning with urination   [] Hematuria Skin:  [] Rashes   [] Ulcers   [] Wounds Psychological:  [] History of anxiety   []  History of major depression.   Physical Examination  BP 117/70   Pulse 67   Resp 16   Wt 90.7 kg (200 lb)   BMI 42.53 kg/m  Gen:  WD/WN, NAD Head: [place]/AT, No temporalis wasting. Ear/Nose/Throat: Hearing grossly intact, nares w/o erythema or drainage, trachea midline Eyes: Conjunctiva clear. Sclera  non-icteric Neck: Supple.  No JVD.  Pulmonary:  Good air movement, no use of accessory muscles.  Cardiac: RRR, normal S1, S2 Vascular: Good thrill was present in left thigh AV graft Vessel Right Left  Radial Palpable Palpable                                   Gastrointestinal: soft, non-tender/non-distended. No guarding/reflex.  Musculoskeletal: M/S 5/5 throughout.  Using a wheelchair. Bilateral lower extremity amputee Neurologic: Sensation grossly intact in extremities.  Symmetrical.  Speech is fluent.  Psychiatric: Judgment intact, Mood & affect appropriate for pt's clinical situation. Dermatologic: No rashes or ulcers noted.  No cellulitis or open wounds.       Labs No results found for this or any previous visit (from the past 2160 hour(s)).  Radiology No results found.    Assessment/Plan Diabetes (HCC) blood glucose control important in reducing the progression of atherosclerotic disease. Also, involved in wound healing. On appropriate medications.   Hyperlipidemia lipid control important in reducing the progression of atherosclerotic disease. Continue statin therapy  ESRD on dialysis [facility]) Her duplex today shows a widely patent left thigh femoral artery to femoral vein graft without significant stenosis. Currently she is doing pretty well. She does have a very limited dialysis access situation and multiple interventions have already been performed on this access. We will continue to follow this on 6 month intervals, and I would be happy to see her back at any point prior to that if necessary.    [name], MD  11/21/2016 1:05 PM    This note was created with Dragon medical transcription system.  Any errors from dictation are purely unintentional

## 2016-12-29 ENCOUNTER — Encounter
Admission: AD | Disposition: A | Discharge status: Home / Self Care | Payer: Self-pay | Source: Ambulatory Visit | Attending: Vascular Surgery

## 2016-12-29 ENCOUNTER — Ambulatory Visit
Admission: AD | Admit: 2016-12-29 | Discharge: 2016-12-29 | Disposition: A | Discharge status: Home / Self Care | Payer: Medicare (Managed Care) | Source: Ambulatory Visit | Attending: Vascular Surgery | Admitting: Vascular Surgery

## 2016-12-29 ENCOUNTER — Other Ambulatory Visit (INDEPENDENT_AMBULATORY_CARE_PROVIDER_SITE_OTHER): Payer: Self-pay

## 2016-12-29 DIAGNOSIS — I2510 Atherosclerotic heart disease of native coronary artery without angina pectoris: Secondary | ICD-10-CM | POA: Diagnosis not present

## 2016-12-29 DIAGNOSIS — Z9889 Other specified postprocedural states: Secondary | ICD-10-CM | POA: Diagnosis not present

## 2016-12-29 DIAGNOSIS — E119 Type 2 diabetes mellitus without complications: Secondary | ICD-10-CM | POA: Diagnosis not present

## 2016-12-29 DIAGNOSIS — E1151 Type 2 diabetes mellitus with diabetic peripheral angiopathy without gangrene: Secondary | ICD-10-CM | POA: Diagnosis not present

## 2016-12-29 DIAGNOSIS — I120 Hypertensive chronic kidney disease with stage 5 chronic kidney disease or end stage renal disease: Secondary | ICD-10-CM | POA: Insufficient documentation

## 2016-12-29 DIAGNOSIS — Z992 Dependence on renal dialysis: Secondary | ICD-10-CM | POA: Insufficient documentation

## 2016-12-29 DIAGNOSIS — Z89512 Acquired absence of left leg below knee: Secondary | ICD-10-CM | POA: Insufficient documentation

## 2016-12-29 DIAGNOSIS — E1122 Type 2 diabetes mellitus with diabetic chronic kidney disease: Secondary | ICD-10-CM | POA: Insufficient documentation

## 2016-12-29 DIAGNOSIS — T82868A Thrombosis of vascular prosthetic devices, implants and grafts, initial encounter: Secondary | ICD-10-CM | POA: Diagnosis not present

## 2016-12-29 DIAGNOSIS — Y832 Surgical operation with anastomosis, bypass or graft as the cause of abnormal reaction of the patient, or of later complication, without mention of misadventure at the time of the procedure: Secondary | ICD-10-CM | POA: Insufficient documentation

## 2016-12-29 DIAGNOSIS — N186 End stage renal disease: Secondary | ICD-10-CM

## 2016-12-29 DIAGNOSIS — M1990 Unspecified osteoarthritis, unspecified site: Secondary | ICD-10-CM | POA: Diagnosis not present

## 2016-12-29 DIAGNOSIS — T82858A Stenosis of vascular prosthetic devices, implants and grafts, initial encounter: Secondary | ICD-10-CM | POA: Diagnosis not present

## 2016-12-29 DIAGNOSIS — Z89611 Acquired absence of right leg above knee: Secondary | ICD-10-CM | POA: Insufficient documentation

## 2016-12-29 DIAGNOSIS — I10 Essential (primary) hypertension: Secondary | ICD-10-CM | POA: Diagnosis not present

## 2016-12-29 DIAGNOSIS — E785 Hyperlipidemia, unspecified: Secondary | ICD-10-CM | POA: Diagnosis not present

## 2016-12-29 HISTORY — PX: PERIPHERAL VASCULAR THROMBECTOMY: CATH118306

## 2016-12-29 LAB — POTASSIUM (ARMC VASCULAR LAB ONLY): POTASSIUM (ARMC VASCULAR LAB): 4.5 (ref 3.5–5.1)

## 2016-12-29 LAB — GLUCOSE, CAPILLARY: GLUCOSE-CAPILLARY: 119 mg/dL — AB (ref 65–99)

## 2016-12-29 SURGERY — PERIPHERAL VASCULAR THROMBECTOMY
Anesthesia: Moderate Sedation

## 2016-12-29 MED ORDER — FENTANYL CITRATE (PF) 100 MCG/2ML IJ SOLN
INTRAMUSCULAR | Status: DC | PRN
  Administered 2016-12-29 (×2): 50 ug via INTRAVENOUS

## 2016-12-29 MED ORDER — HEPARIN SODIUM (PORCINE) 1000 UNIT/ML IJ SOLN
INTRAMUSCULAR | Status: DC | PRN
  Administered 2016-12-29: 3000 [IU] via INTRAVENOUS

## 2016-12-29 MED ORDER — HEPARIN SODIUM (PORCINE) 1000 UNIT/ML IJ SOLN
INTRAMUSCULAR | Status: AC
  Filled 2016-12-29: qty 1

## 2016-12-29 MED ORDER — LIDOCAINE HCL (PF) 1 % IJ SOLN
INTRAMUSCULAR | Status: AC
  Filled 2016-12-29: qty 30

## 2016-12-29 MED ORDER — MIDAZOLAM HCL 2 MG/2ML IJ SOLN
INTRAMUSCULAR | Status: DC | PRN
  Administered 2016-12-29: 1 mg via INTRAVENOUS
  Administered 2016-12-29: 2 mg via INTRAVENOUS

## 2016-12-29 MED ORDER — FENTANYL CITRATE (PF) 100 MCG/2ML IJ SOLN
INTRAMUSCULAR | Status: AC
  Filled 2016-12-29: qty 2

## 2016-12-29 MED ORDER — HEPARIN (PORCINE) IN NACL 2-0.9 UNIT/ML-% IJ SOLN
INTRAMUSCULAR | Status: AC
  Filled 2016-12-29: qty 1000

## 2016-12-29 MED ORDER — MIDAZOLAM HCL 5 MG/5ML IJ SOLN
INTRAMUSCULAR | Status: AC
  Filled 2016-12-29: qty 5

## 2016-12-29 SURGICAL SUPPLY — 14 items
BALLN DORADO 8X60X80 (BALLOONS) ×3
BALLOON DORADO 8X60X80 (BALLOONS) ×1 IMPLANT
DEVICE PRESTO INFLATION (MISCELLANEOUS) ×3 IMPLANT
DRAPE BRACHIAL (DRAPES) ×3 IMPLANT
NEEDLE ENTRY 21GA 7CM ECHOTIP (NEEDLE) ×3 IMPLANT
PACK ANGIOGRAPHY (CUSTOM PROCEDURE TRAY) ×3 IMPLANT
SET INTRO CAPELLA COAXIAL (SET/KITS/TRAYS/PACK) ×3 IMPLANT
SHEATH BRITE TIP 6FRX5.5 (SHEATH) ×6 IMPLANT
SHIELD RADPAD SCOOP 12X17 (MISCELLANEOUS) ×3 IMPLANT
SUT MNCRL AB 4-0 PS2 18 (SUTURE) ×3 IMPLANT
SYR MEDRAD MARK V 150ML (SYRINGE) ×3 IMPLANT
TUBING CONTRAST HIGH PRESS 72 (TUBING) ×3 IMPLANT
WIRE J 3MM .035X145CM (WIRE) ×3 IMPLANT
WIRE MAGIC TORQUE 260C (WIRE) ×3 IMPLANT

## 2016-12-29 NOTE — Op Note (Signed)
OPERATIVE NOTE   PROCEDURE: 1. Contrast injection left thigh  AV access 2. Percutaneous transluminal angioplasty venous anastomosis left thigh AV access  PRE-OPERATIVE DIAGNOSIS: Complication of dialysis access                                                       End Stage Renal Disease  POST-OPERATIVE DIAGNOSIS: same as above   SURGEON: Katha Cabal, M.D.  ANESTHESIA: Conscious sedation was administered under my direct supervision by the interventional radiology RN. IV Versed plus fentanyl were utilized. Continuous ECG, pulse oximetry and blood pressure was monitored throughout the entire procedure.  Conscious sedation was for a total of 33.  ESTIMATED BLOOD LOSS: minimal  FINDING(S): Stricture of the AV graft  SPECIMEN(S):  None  CONTRAST: 15 cc  FLUOROSCOPY TIME: 2.8 minutes  INDICATIONS: Amy Wu is a 66 y.o. female who  presents with malfunctioning left thigh AV access.  The patient is scheduled for angiography with possible intervention of the AV access.  The patient is aware the risks include but are not limited to: bleeding, infection, thrombosis of the cannulated access, and possible anaphylactic reaction to the contrast.  The patient acknowledges if the access can not be salvaged a tunneled catheter will be needed and will be placed during this procedure.  The patient is aware of the risks of the procedure and elects to proceed with the angiogram and intervention.  DESCRIPTION: After full informed written consent was obtained, the patient was brought back to the Special Procedure suite and placed supine position.  Appropriate cardiopulmonary monitors were placed.  The left thigh was prepped and draped in the standard fashion.  Appropriate timeout is called. The left thigh  was cannulated with a micropuncture needle.  Cannulation was performed with ultrasound guidance. Ultrasound was placed in a sterile sleeve, the AV access was interrogated and noted to be  echolucent and compressible indicating patency. Image was recorded for the permanent record. The puncture is performed under continuous ultrasound visualization.   The microwire was advanced and the needle was exchanged for  a microsheath.  The J-wire was then advanced and a 6 Fr sheath inserted.  Hand injections were completed to image the access from the arterial anastomosis through the entire access.  The central venous structures were also imaged by hand injections.  Based on the images,  3000 units of heparin was given and a wire was negotiated through the strictures within the venous portion of the graft.  An 8 x 6 [place] balloon was used.  Inflation was to 10 atm.  Follow-up imaging demonstrates complete resolution of the stricture with rapid flow of contrast through the graft, the central venous anatomy is preserved.  A 4-0 Monocryl purse-string suture was sewn around the sheath.  The sheath was removed and light pressure was applied.  A sterile bandage was applied to the puncture site.    COMPLICATIONS: None  CONDITION: Amy Wu, M.D [facility] Office: [phone]  12/29/2016 3:18 PM

## 2016-12-29 NOTE — H&P (Signed)
[name] Admission History & Physical  MRN : [id number]  Amy Wu is a 66 y.o. ([birth year]) female who presents with chief complaint of No chief complaint on file. Marland Kitchen  History of Present Illness: I am asked to evaluate the patient by the dialysis center. The patient was sent here because they were unable to cannulate the left thigh AV graft this morning. Furthermore the Center states there is no thrill or bruit. The patient states this is the first dialysis run to be missed. This problem is acute in onset and has been present for approximately 2 days. The patient is unaware of any other change.  Patient denies pain or tenderness overlying the access.  There is no pain with dialysis.  The patient denies hand pain or finger pain consistent with steal syndrome.   There have not been any past interventions or declots of this access.  The patient is not chronically hypotensive on dialysis.  No current facility-administered medications for this encounter.     Past Medical History:  Diagnosis Date  . Arthritis   . Chronic kidney disease   . Diabetes mellitus without complication ([place])   . Hyperlipidemia   . Hypertension   . Peripheral vascular disease [facility])     Past Surgical History:  Procedure Laterality Date  . AV FISTULA PLACEMENT Bilateral   . AV FISTULA PLACEMENT Left 07/01/2015   Procedure: INSERTION OF ARTERIOVENOUS (AV) GRAFT THIGH (ARTEGRAFT);  Surgeon: Algernon Huxley, MD;  Location: [facility];  Service: Vascular;  Laterality: Left;  . BELOW KNEE LEG AMPUTATION Left   . ENDARTERECTOMY FEMORAL Left 07/01/2015   Procedure: SUPERFICIAL FEMORAL ENDARTERECTOMY ;  Surgeon: Algernon Huxley, MD;  Location: [facility];  Service: Vascular;  Laterality: Left;  . EXCHANGE OF A DIALYSIS CATHETER  02/11/2015   Procedure: Exchange Of A Dialysis Catheter;  Surgeon: Algernon Huxley, MD;  Location: [place] [facility];  Service: Cardiovascular;;  . LEG AMPUTATION ABOVE KNEE  Right   . PERIPHERAL VASCULAR CATHETERIZATION N/A 02/11/2015   Procedure: Dialysis/Perma Catheter Insertion;  Surgeon: Algernon Huxley, MD;  Location: [place] [facility];  Service: Cardiovascular;  Laterality: N/A;  . PERIPHERAL VASCULAR CATHETERIZATION N/A 05/11/2015   Procedure: Dialysis/Perma Catheter Insertion, exchange;  Surgeon: Katha Cabal, MD;  Location: [place] [facility];  Service: Cardiovascular;  Laterality: N/A;  . PERIPHERAL VASCULAR CATHETERIZATION N/A 05/18/2015   Procedure: Dialysis/Perma Catheter Insertion;  Surgeon: Katha Cabal, MD;  Location: [place] [facility];  Service: Cardiovascular;  Laterality: N/A;  . PERIPHERAL VASCULAR CATHETERIZATION Left 07/19/2015   Procedure: A/V Shuntogram/Fistulagram;  Surgeon: Algernon Huxley, MD;  Location: [place] [facility];  Service: Cardiovascular;  Laterality: Left;  . PERIPHERAL VASCULAR CATHETERIZATION N/A 07/19/2015   Procedure: A/V Shunt Intervention;  Surgeon: Algernon Huxley, MD;  Location: [place] [facility];  Service: Cardiovascular;  Laterality: N/A;  . PERIPHERAL VASCULAR CATHETERIZATION N/A 08/26/2015   Procedure: Dialysis/Perma Catheter Removal;  Surgeon: Algernon Huxley, MD;  Location: [place] [facility];  Service: Cardiovascular;  Laterality: N/A;    Social History Social History  Substance Use Topics  . Smoking status: Never Smoker  . Smokeless tobacco: Never Used  . Alcohol use No    Family History Family History  Problem Relation Age of Onset  . Breast cancer Neg Hx     No family history of bleeding or clotting disorders, autoimmune disease or porphyria  No Known Allergies   REVIEW  OF SYSTEMS (Negative unless checked)  Constitutional: [] Weight loss  [] Fever  [] Chills Cardiac: [] Chest pain   [] Chest pressure   [] Palpitations   [] Shortness of breath when laying flat   [] Shortness of breath at rest   [x] Shortness of breath with exertion. Vascular:  [] Pain in legs with walking   [] Pain in legs at rest    [] Pain in legs when laying flat   [] Claudication   [] Pain in feet when walking  [] Pain in feet at rest  [] Pain in feet when laying flat   [] History of DVT   [] Phlebitis   [] Swelling in legs   [] Varicose veins   [] Non-healing ulcers Pulmonary:   [] Uses home oxygen   [] Productive cough   [] Hemoptysis   [] Wheeze  [] COPD   [] Asthma Neurologic:  [] Dizziness  [] Blackouts   [] Seizures   [] History of stroke   [] History of TIA  [] Aphasia   [] Temporary blindness   [] Dysphagia   [] Weakness or numbness in arms   [] Weakness or numbness in legs Musculoskeletal:  [x] Arthritis   [] Joint swelling   [] Joint pain   [] Low back pain Hematologic:  [] Easy bruising  [] Easy bleeding   [] Hypercoagulable state   [] Anemic  [] Hepatitis Gastrointestinal:  [] Blood in stool   [] Vomiting blood  [] Gastroesophageal reflux/heartburn   [] Difficulty swallowing. Genitourinary:  [x] Chronic kidney disease   [] Difficult urination  [] Frequent urination  [] Burning with urination   [] Blood in urine Skin:  [] Rashes   [] Ulcers   [] Wounds Psychological:  [] History of anxiety   []  History of major depression.  Physical Examination  Vitals:   12/29/16 1035  BP: (!) 123/50  Pulse: 73  Resp: (!) 23  Temp: 98 F (36.7 C)  TempSrc: Oral  SpO2: 98%  Weight: 93.9 kg (207 lb)  Height: 4\' 9"  (1.448 m)   Body mass index is 44.79 kg/m. Gen: WD/WN, NAD Head: [place]/AT, No temporalis wasting. Prominent temp pulse not noted. Ear/Nose/Throat: Hearing grossly intact, nares w/o erythema or drainage, oropharynx w/o Erythema/Exudate,  Eyes: Conjunctiva clear, sclera non-icteric Neck: Trachea midline.  No JVD.  Pulmonary:  Good air movement, respirations not labored, no use of accessory muscles.  Cardiac: RRR, normal S1, S2. Vascular: left thigh no thrill no bruit Vessel Right Left  Radial Palpable Palpable  Brachial Palpable Palpable  Femoral Palpable, without bruit Palpable, without bruit  Gastrointestinal: soft, non-tender/non-distended. No  guarding/reflex.  Musculoskeletal: M/S 5/5 throughout.  Extremities without ischemic changes.  No deformity or atrophy.  Neurologic: Sensation grossly intact in extremities.  Symmetrical.  Speech is fluent. Motor exam as listed above. Psychiatric: Judgment intact, Mood & affect appropriate for pt's clinical situation. Dermatologic: No rashes or ulcers noted.  No cellulitis or open wounds. Lymph : No Cervical, Axillary, or Inguinal lymphadenopathy.   CBC Lab Results  Component Value Date   WBC 9.9 06/22/2015   HGB 15.3 (H) 07/01/2015   HCT 45.0 07/01/2015   MCV 78.7 (L) 06/22/2015   PLT 169 06/22/2015    BMET    Component Value Date/Time   NA 139 07/01/2015 1103   NA 138 12/03/2012 1414   K 4.9 07/01/2015 1103   K 4.5 12/03/2012 1414   CL 102 06/22/2015 1606   CL 101 12/03/2012 1414   CO2 27 06/22/2015 1606   CO2 27 12/03/2012 1414   GLUCOSE 124 (H) 07/01/2015 1103   GLUCOSE 326 (H) 12/03/2012 1414   BUN 38 (H) 06/22/2015 1606   BUN 37 (H) 12/03/2012 1414   CREATININE 7.47 (H) 06/22/2015 1606  CREATININE 7.04 (H) 12/03/2012 1414   CALCIUM 9.5 06/22/2015 1606   CALCIUM 8.4 (L) 12/03/2012 1414   GFRNONAA 5 (L) 06/22/2015 1606   GFRNONAA 6 (L) 12/03/2012 1414   GFRAA 6 (L) 06/22/2015 1606   GFRAA 7 (L) 12/03/2012 1414   CrCl cannot be calculated (Patient's most recent lab result is older than the maximum 21 days allowed.).  COAG Lab Results  Component Value Date   INR 1.11 06/22/2015   INR 1.0 07/26/2011    Radiology No results found.  Assessment/Plan 1.  Complication dialysis device with thrombosis AV access:  Patient's left thigh dialysis access is thrombosed. The patient will undergo thrombectomy using interventional techniques.  The risks and benefits were described to the patient.  All questions were answered.  The patient agrees to proceed with angiography and intervention. Potassium will be drawn to ensure that it is an appropriate level prior to performing  thrombectomy. 2.  End-stage renal disease requiring hemodialysis:  Patient will continue dialysis therapy without further interruption if a successful thrombectomy is not achieved then catheter will be placed. Dialysis has already been arranged since the patient missed their previous session 3.  Hypertension:  Patient will continue medical management; nephrology is following no changes in oral medications. 4. Diabetes mellitus:  Glucose will be monitored and oral medications been held this morning once the patient has undergone the patient's procedure po intake will be reinitiated and again Accu-Cheks will be used to assess the blood glucose level and treat as needed. The patient will be restarted on the patient's usual hypoglycemic regime 5.  Coronary artery disease:  EKG will be monitored. Nitrates will be used if needed. The patient's oral cardiac medications will be continued.    Hortencia Pilar, MD  12/29/2016 2:04 PM

## 2017-01-14 IMAGING — MG MM DIGITAL SCREENING BILAT W/ CAD
1 series · 5 of 5 positions shown · non-contrast
Comparison: Previous exam(s).

CLINICAL DATA: Screening.

EXAM:
DIGITAL SCREENING BILATERAL MAMMOGRAM WITH CAD

[R CC · right · 5 of 5 slices shown]
[im 1/5]
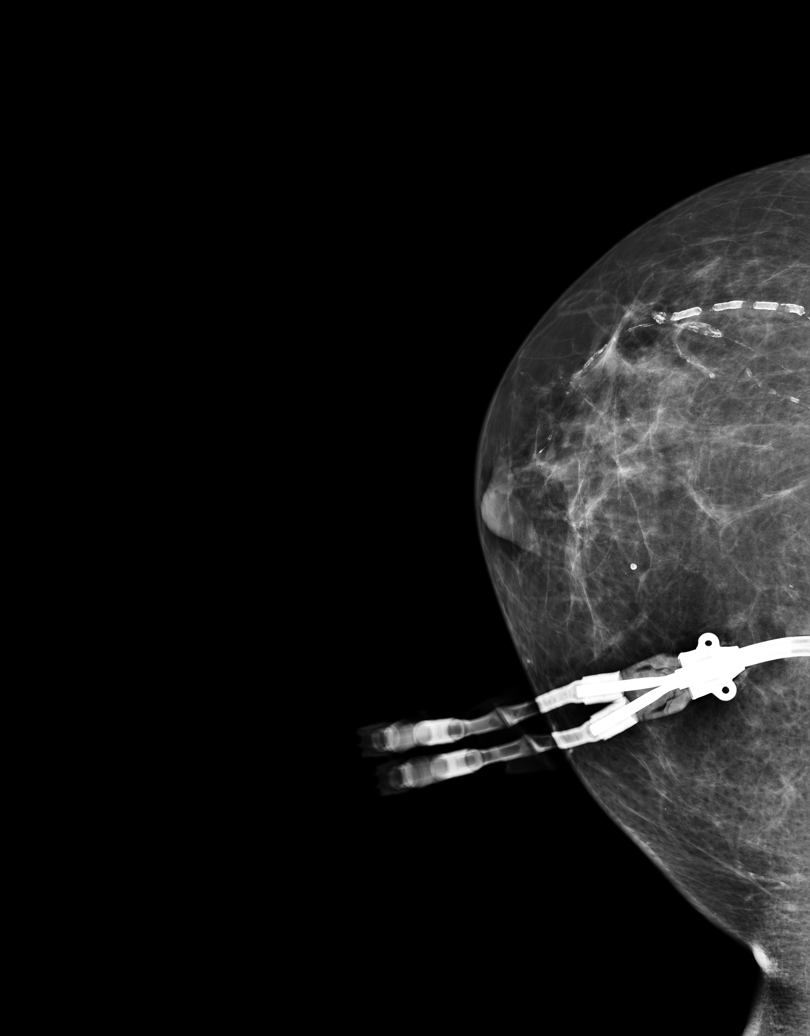
[im 2/5]
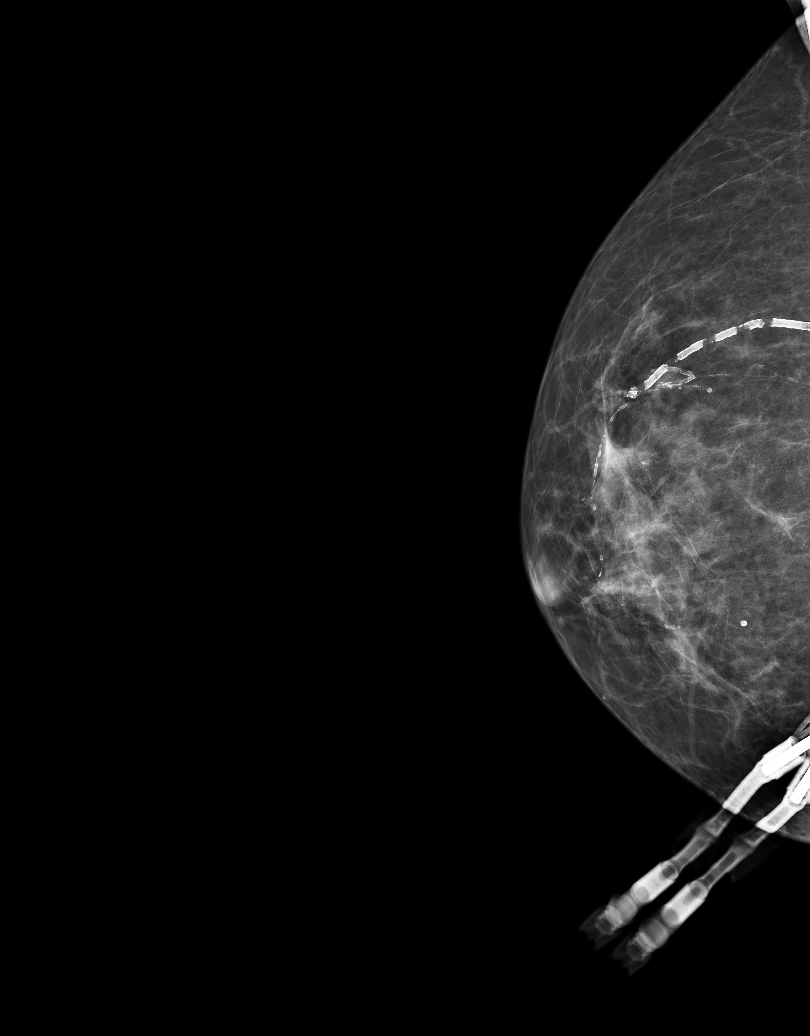
[im 3/5]
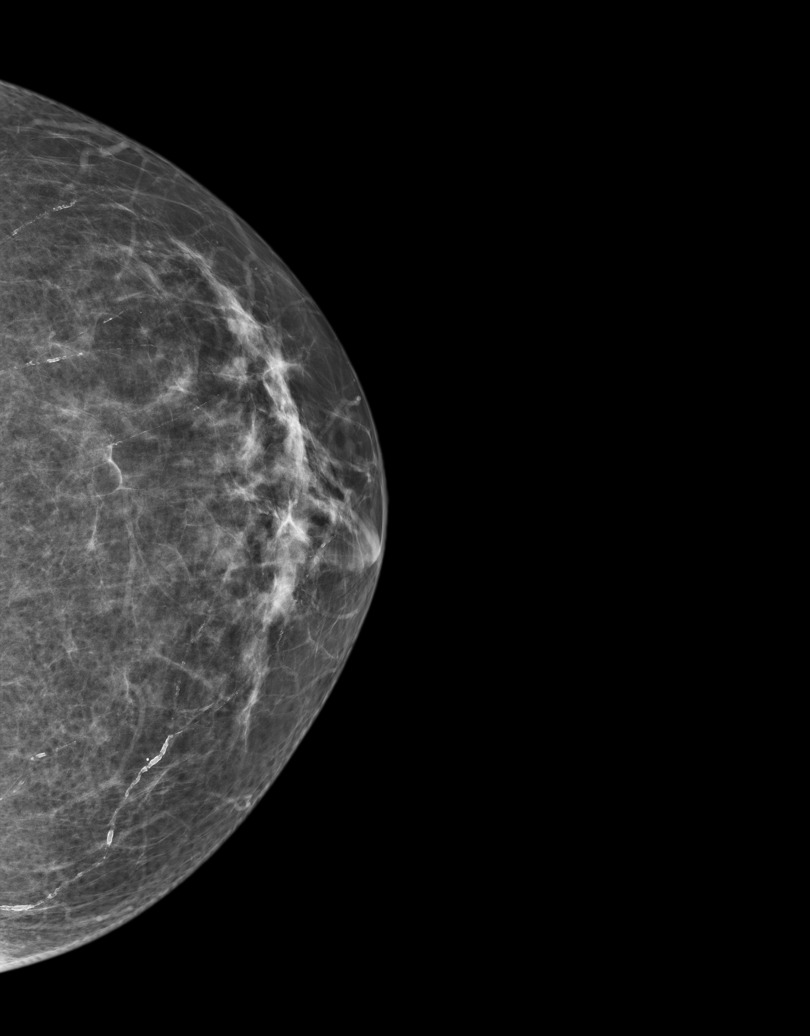
[im 4/5]
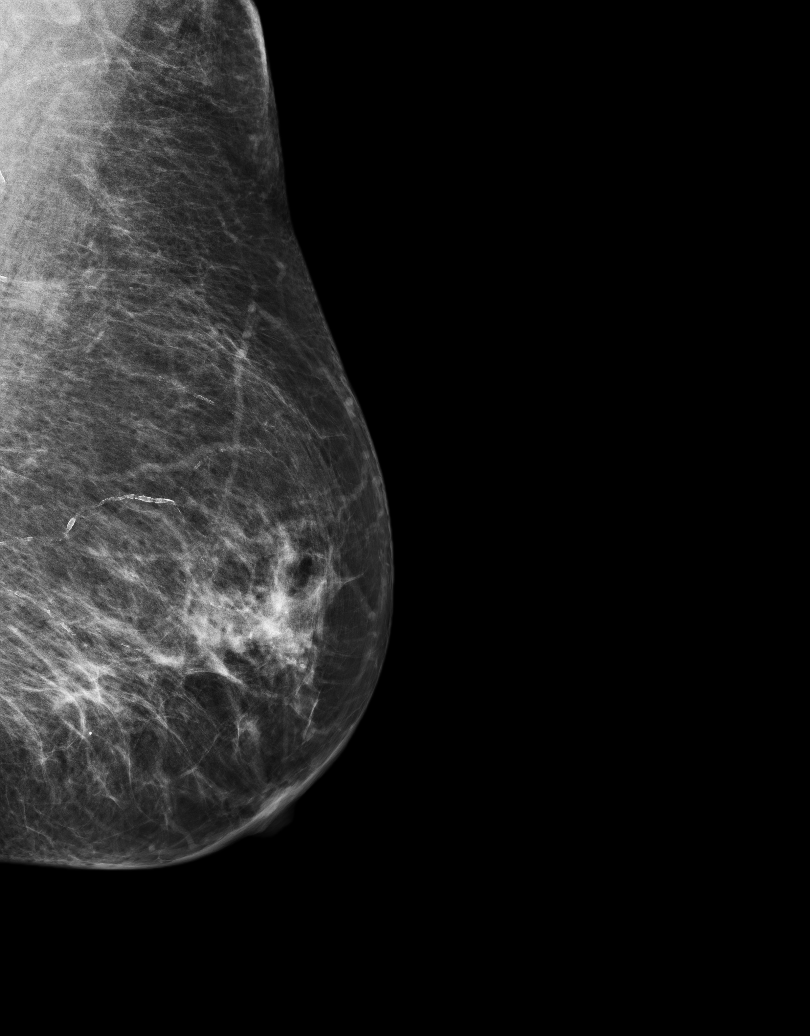
[im 5/5]
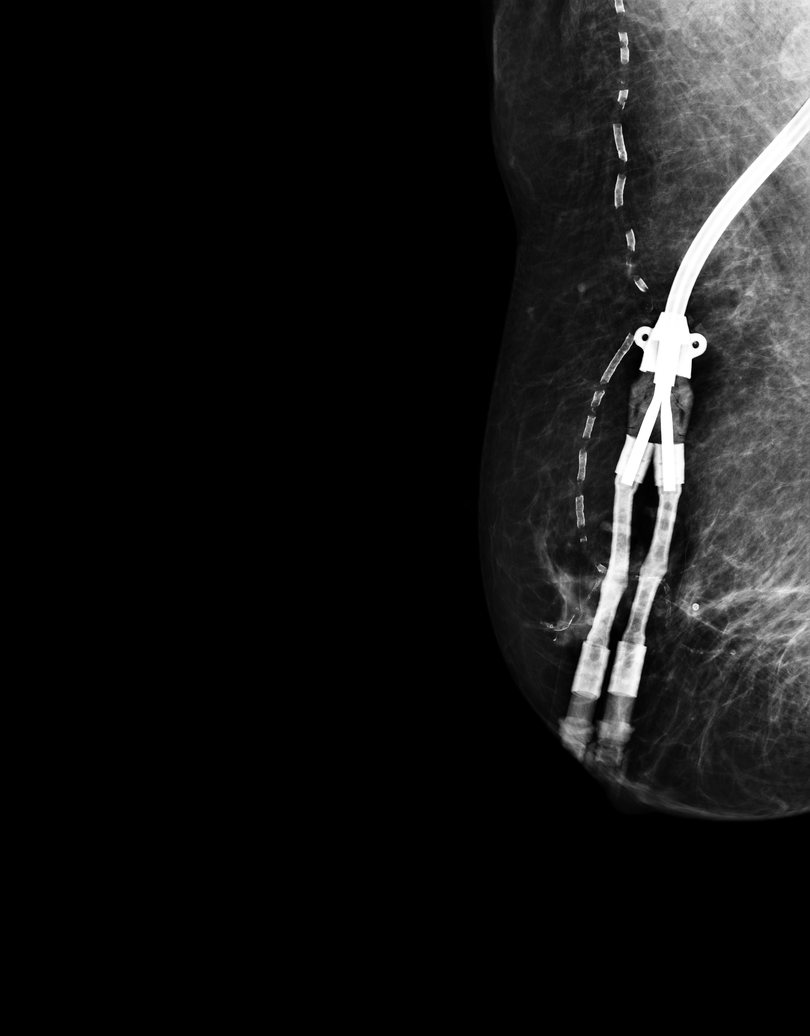

[5 of 5 positions shown; findings below may reference images not displayed]

ACR Breast Density Category b: There are scattered areas of
fibroglandular density.
FINDINGS: There are no findings suspicious for malignancy. Images were
processed with CAD.
IMPRESSION: No mammographic evidence of malignancy. A result letter of this
screening mammogram will be mailed directly to the patient.

RECOMMENDATION:
Screening mammogram in one year. (Code:AS-G-LCT)

BI-RADS CATEGORY  1: Negative.

## 2017-02-14 ENCOUNTER — Telehealth (INDEPENDENT_AMBULATORY_CARE_PROVIDER_SITE_OTHER): Payer: Self-pay | Admitting: Vascular Surgery

## 2017-02-14 NOTE — Telephone Encounter (Signed)
Dialysis Center wants patient seen STAT due to access not working well. Wanted patient seen tomorrow, but their is no provider. Please advise.

## 2017-02-14 NOTE — Telephone Encounter (Signed)
Set her up for a fistulagram Friday

## 2017-02-15 ENCOUNTER — Other Ambulatory Visit (INDEPENDENT_AMBULATORY_CARE_PROVIDER_SITE_OTHER): Payer: Self-pay

## 2017-02-16 ENCOUNTER — Ambulatory Visit
Admission: RE | Admit: 2017-02-16 | Discharge: 2017-02-16 | Disposition: A | Discharge status: Home / Self Care | Payer: Medicare (Managed Care) | Source: Ambulatory Visit | Attending: Vascular Surgery | Admitting: Vascular Surgery

## 2017-02-16 ENCOUNTER — Encounter
Admission: RE | Disposition: A | Discharge status: Home / Self Care | Payer: Self-pay | Source: Ambulatory Visit | Attending: Vascular Surgery

## 2017-02-16 ENCOUNTER — Ambulatory Visit (INDEPENDENT_AMBULATORY_CARE_PROVIDER_SITE_OTHER): Payer: Medicare (Managed Care) | Admitting: Vascular Surgery

## 2017-02-16 DIAGNOSIS — Z89611 Acquired absence of right leg above knee: Secondary | ICD-10-CM | POA: Diagnosis not present

## 2017-02-16 DIAGNOSIS — Y832 Surgical operation with anastomosis, bypass or graft as the cause of abnormal reaction of the patient, or of later complication, without mention of misadventure at the time of the procedure: Secondary | ICD-10-CM | POA: Diagnosis not present

## 2017-02-16 DIAGNOSIS — E1151 Type 2 diabetes mellitus with diabetic peripheral angiopathy without gangrene: Secondary | ICD-10-CM | POA: Diagnosis not present

## 2017-02-16 DIAGNOSIS — I10 Essential (primary) hypertension: Secondary | ICD-10-CM | POA: Diagnosis not present

## 2017-02-16 DIAGNOSIS — I120 Hypertensive chronic kidney disease with stage 5 chronic kidney disease or end stage renal disease: Secondary | ICD-10-CM | POA: Insufficient documentation

## 2017-02-16 DIAGNOSIS — Z89512 Acquired absence of left leg below knee: Secondary | ICD-10-CM | POA: Insufficient documentation

## 2017-02-16 DIAGNOSIS — Z9889 Other specified postprocedural states: Secondary | ICD-10-CM | POA: Insufficient documentation

## 2017-02-16 DIAGNOSIS — E785 Hyperlipidemia, unspecified: Secondary | ICD-10-CM | POA: Diagnosis not present

## 2017-02-16 DIAGNOSIS — M1990 Unspecified osteoarthritis, unspecified site: Secondary | ICD-10-CM | POA: Insufficient documentation

## 2017-02-16 DIAGNOSIS — E1122 Type 2 diabetes mellitus with diabetic chronic kidney disease: Secondary | ICD-10-CM | POA: Diagnosis not present

## 2017-02-16 DIAGNOSIS — T82858A Stenosis of vascular prosthetic devices, implants and grafts, initial encounter: Secondary | ICD-10-CM | POA: Diagnosis present

## 2017-02-16 DIAGNOSIS — E119 Type 2 diabetes mellitus without complications: Secondary | ICD-10-CM | POA: Diagnosis not present

## 2017-02-16 DIAGNOSIS — N186 End stage renal disease: Secondary | ICD-10-CM | POA: Diagnosis not present

## 2017-02-16 DIAGNOSIS — Z992 Dependence on renal dialysis: Secondary | ICD-10-CM | POA: Insufficient documentation

## 2017-02-16 DIAGNOSIS — T82868A Thrombosis of vascular prosthetic devices, implants and grafts, initial encounter: Secondary | ICD-10-CM | POA: Diagnosis not present

## 2017-02-16 HISTORY — PX: A/V SHUNTOGRAM: CATH118297

## 2017-02-16 LAB — GLUCOSE, CAPILLARY
Glucose-Capillary: 115 mg/dL — ABNORMAL HIGH (ref 65–99)
Glucose-Capillary: 137 mg/dL — ABNORMAL HIGH (ref 65–99)

## 2017-02-16 LAB — POTASSIUM (ARMC VASCULAR LAB ONLY): Potassium (ARMC vascular lab): 4.3 (ref 3.5–5.1)

## 2017-02-16 SURGERY — A/V SHUNTOGRAM
Anesthesia: Moderate Sedation | Laterality: Left

## 2017-02-16 MED ORDER — LIDOCAINE-EPINEPHRINE (PF) 2 %-1:200000 IJ SOLN
INTRAMUSCULAR | Status: AC
  Filled 2017-02-16: qty 20

## 2017-02-16 MED ORDER — METHYLPREDNISOLONE SODIUM SUCC 125 MG IJ SOLR
INTRAMUSCULAR | Status: AC
  Administered 2017-02-16: 125 mg via INTRAVENOUS
  Filled 2017-02-16: qty 2

## 2017-02-16 MED ORDER — ONDANSETRON HCL 4 MG/2ML IJ SOLN
INTRAMUSCULAR | Status: AC
  Administered 2017-02-16: 4 mg via INTRAVENOUS
  Filled 2017-02-16: qty 2

## 2017-02-16 MED ORDER — METHYLPREDNISOLONE SODIUM SUCC 125 MG IJ SOLR
125.0000 mg | Freq: Once | INTRAMUSCULAR | Status: AC
  Administered 2017-02-16: 125 mg via INTRAVENOUS

## 2017-02-16 MED ORDER — ONDANSETRON HCL 4 MG/2ML IJ SOLN
INTRAMUSCULAR | Status: AC
  Filled 2017-02-16: qty 2

## 2017-02-16 MED ORDER — METHYLPREDNISOLONE SODIUM SUCC 125 MG IJ SOLR
INTRAMUSCULAR | Status: AC
  Filled 2017-02-16: qty 2

## 2017-02-16 MED ORDER — CEFAZOLIN SODIUM-DEXTROSE 2-4 GM/100ML-% IV SOLN
2.0000 g | Freq: Once | INTRAVENOUS | Status: AC
  Administered 2017-02-16: 2 g via INTRAVENOUS

## 2017-02-16 MED ORDER — MIDAZOLAM HCL 5 MG/5ML IJ SOLN
INTRAMUSCULAR | Status: AC
  Filled 2017-02-16: qty 5

## 2017-02-16 MED ORDER — FENTANYL CITRATE (PF) 100 MCG/2ML IJ SOLN
INTRAMUSCULAR | Status: DC | PRN
  Administered 2017-02-16: 50 ug via INTRAVENOUS

## 2017-02-16 MED ORDER — CEFAZOLIN SODIUM-DEXTROSE 2-4 GM/100ML-% IV SOLN
INTRAVENOUS | Status: AC
  Administered 2017-02-16: 2 g via INTRAVENOUS
  Filled 2017-02-16: qty 100

## 2017-02-16 MED ORDER — DIPHENHYDRAMINE HCL 50 MG/ML IJ SOLN
INTRAMUSCULAR | Status: DC | PRN
  Administered 2017-02-16: 50 mg via INTRAVENOUS

## 2017-02-16 MED ORDER — ONDANSETRON HCL 4 MG/5ML PO SOLN
4.0000 mg | Freq: Once | ORAL | Status: DC
Start: 2017-02-16 — End: 2017-02-16

## 2017-02-16 MED ORDER — DIPHENHYDRAMINE HCL 50 MG/ML IJ SOLN
INTRAMUSCULAR | Status: AC
  Filled 2017-02-16: qty 1

## 2017-02-16 MED ORDER — IOPAMIDOL (ISOVUE-300) INJECTION 61%
INTRAVENOUS | Status: DC | PRN
  Administered 2017-02-16: 20 mL via INTRA_ARTERIAL

## 2017-02-16 MED ORDER — SODIUM CHLORIDE 0.9 % IV SOLN
INTRAVENOUS | Status: DC
  Administered 2017-02-16: 1000 mL via INTRAVENOUS

## 2017-02-16 MED ORDER — MIDAZOLAM HCL 2 MG/2ML IJ SOLN
INTRAMUSCULAR | Status: DC | PRN
  Administered 2017-02-16: 2 mg via INTRAVENOUS

## 2017-02-16 MED ORDER — FENTANYL CITRATE (PF) 100 MCG/2ML IJ SOLN
INTRAMUSCULAR | Status: AC
Start: 2017-02-16 — End: 2017-02-16
  Filled 2017-02-16: qty 2

## 2017-02-16 MED ORDER — HEPARIN SODIUM (PORCINE) 1000 UNIT/ML IJ SOLN
INTRAMUSCULAR | Status: AC
  Filled 2017-02-16: qty 1

## 2017-02-16 MED ORDER — HEPARIN SODIUM (PORCINE) 1000 UNIT/ML IJ SOLN
INTRAMUSCULAR | Status: DC | PRN
  Administered 2017-02-16: 3000 [IU] via INTRAVENOUS

## 2017-02-16 MED ORDER — HEPARIN (PORCINE) IN NACL 2-0.9 UNIT/ML-% IJ SOLN
INTRAMUSCULAR | Status: AC
  Filled 2017-02-16: qty 1000

## 2017-02-16 SURGICAL SUPPLY — 11 items
BALLN LUTONIX AV 8X40X75 (BALLOONS) ×3
BALLOON LUTONIX AV 8X40X75 (BALLOONS) ×1 IMPLANT
CANNULA 5F STIFF (CANNULA) ×3 IMPLANT
COVER PROBE U/S 5X48 (MISCELLANEOUS) ×3 IMPLANT
DEVICE PRESTO INFLATION (MISCELLANEOUS) ×3 IMPLANT
DRAPE BRACHIAL (DRAPES) ×3 IMPLANT
PACK ANGIOGRAPHY (CUSTOM PROCEDURE TRAY) ×3 IMPLANT
SHEATH BRITE TIP 6FRX5.5 (SHEATH) ×3 IMPLANT
SUT MNCRL AB 4-0 PS2 18 (SUTURE) ×6 IMPLANT
TOWEL OR 17X26 4PK STRL BLUE (TOWEL DISPOSABLE) ×3 IMPLANT
WIRE MAGIC TOR.035 180C (WIRE) ×3 IMPLANT

## 2017-02-16 NOTE — Discharge Instructions (Signed)
Moderate Conscious Sedation, Adult °Sedation is the use of medicines to promote relaxation and relieve discomfort and anxiety. Moderate conscious sedation is a type of sedation. Under moderate conscious sedation, you are less alert than normal, but you are still able to respond to instructions, touch, or both. °Moderate conscious sedation is used during short medical and dental procedures. It is milder than deep sedation, which is a type of sedation under which you cannot be easily woken up. It is also milder than general anesthesia, which is the use of medicines to make you unconscious. Moderate conscious sedation allows you to return to your regular activities sooner. °Tell a health care provider about: °· Any allergies you have. °· All medicines you are taking, including vitamins, herbs, eye drops, creams, and over-the-counter medicines. °· Use of steroids (by mouth or creams). °· Any problems you or family members have had with sedatives and anesthetic medicines. °· Any blood disorders you have. °· Any surgeries you have had. °· Any medical conditions you have, such as sleep apnea. °· Whether you are pregnant or may be pregnant. °· Any use of cigarettes, alcohol, marijuana, or street drugs. °What are the risks? °Generally, this is a safe procedure. However, problems may occur, including: °· Getting too much medicine (oversedation). °· Nausea. °· Allergic reaction to medicines. °· Trouble breathing. If this happens, a breathing tube may be used to help with breathing. It will be removed when you are awake and breathing on your own. °· Heart trouble. °· Lung trouble. ° °What happens before the procedure? °Staying hydrated °Follow instructions from your health care provider about hydration, which may include: °· Up to 2 hours before the procedure - you may continue to drink clear liquids, such as water, clear fruit juice, black coffee, and plain tea. ° °Eating and drinking restrictions °Follow instructions from  your health care provider about eating and drinking, which may include: °· 8 hours before the procedure - stop eating heavy meals or foods such as meat, fried foods, or fatty foods. °· 6 hours before the procedure - stop eating light meals or foods, such as toast or cereal. °· 6 hours before the procedure - stop drinking milk or drinks that contain milk. °· 2 hours before the procedure - stop drinking clear liquids. ° °Medicine ° °Ask your health care provider about: °· Changing or stopping your regular medicines. This is especially important if you are taking diabetes medicines or blood thinners. °· Taking medicines such as aspirin and ibuprofen. These medicines can thin your blood. Do not take these medicines before your procedure if your health care provider instructs you not to. ° °Tests and exams °· You will have a physical exam. °· You may have blood tests done to show: °? How well your kidneys and liver are working. °? How well your blood can clot. °General instructions °· Plan to have someone take you home from the hospital or clinic. °· If you will be going home right after the procedure, plan to have someone with you for 24 hours. °What happens during the procedure? °· An IV tube will be inserted into one of your veins. °· Medicine to help you relax (sedative) will be given through the IV tube. °· The medical or dental procedure will be performed. °What happens after the procedure? °· Your blood pressure, heart rate, breathing rate, and blood oxygen level will be monitored often until the medicines you were given have worn off. °· Do not drive for 24 hours. °  This information is not intended to replace advice given to you by your health care provider. Make sure you discuss any questions you have with your health care provider. Document Released: 02/28/2001 Document Revised: 11/09/2015 Document Reviewed: 09/25/2015 Elsevier Interactive Patient Education  2018 [place] After Refer  to this sheet in the next few weeks. These instructions provide you with information on caring for yourself after your procedure. Your health care provider may also give you more specific instructions. Your treatment has been planned according to current medical practices, but problems sometimes occur. Call your health care provider if you have any problems or questions after your procedure. What can I expect after the procedure? After your procedure, it is typical to have the following:  A small amount of discomfort in the area where the catheters were placed.  A small amount of bruising around the fistula.  Sleepiness and fatigue.  Follow these instructions at home:  Rest at home for the day following your procedure.  Do not drive or operate heavy machinery while taking pain medicine.  Take medicines only as directed by your health care provider.  Do not take baths, swim, or use a hot tub until your health care provider approves. You may shower 24 hours after the procedure or as directed by your health care provider.  There are many different ways to close and cover an incision, including stitches, skin glue, and adhesive strips. Follow your health care provider's instructions on: ? Incision care. ? Bandage (dressing) changes and removal. ? Incision closure removal.  Monitor your dialysis fistula carefully. Contact a health care provider if:  You have drainage, redness, swelling, or pain at your catheter site.  You have a fever.  You have chills. Get help right away if:  You feel weak.  You have trouble balancing.  You have trouble moving your arms or legs.  You have problems with your speech or vision.  You can no longer feel a vibration or buzz when you put your fingers over your dialysis fistula.  The limb that was used for the procedure: ? Swells. ? Is painful. ? Is cold. ? Is discolored, such as blue or pale white. This information is not intended to replace  advice given to you by your health care provider. Make sure you discuss any questions you have with your health care provider. Document Released: 10/20/2013 Document Revised: 11/11/2015 Document Reviewed: 07/25/2013 Elsevier Interactive Patient Education  2018 [employer].

## 2017-02-16 NOTE — Progress Notes (Signed)
Patient complained of itching during procedure. IV ABX stopped and patient given fluids, benadryl and solumedrol. Patient went into ST with heartrate at 125. Patient moved to recovery area. EKG performed. MD to evaluate.

## 2017-02-16 NOTE — H&P (Signed)
[facility] Admission History & Physical  MRN : [id number]  Amy Wu is a 66 y.o. ([birth year]) female who presents with chief complaint of No chief complaint on file. Marland Kitchen  History of Present Illness: I am asked to evaluate the patient by the dialysis center. The patient was sent here because they were unable to achieve adequate dialysis this morning. Furthermore the Center states there is very poor thrill and bruit. The patient states there there have been increasing problems with the access, such as "pulling clots" during dialysis and prolonged bleeding after decannulation. The patient estimates these problems have been going on for several weeks. The patient is unaware of any other change.  Patient denies pain or tenderness overlying the access.  There is no pain with dialysis.  The patient denies hand pain or finger pain consistent with steal syndrome.   There have been no recent past interventions or declots of this access.  The patient is not chronically hypotensive on dialysis.  Current Facility-Administered Medications  Medication Dose Route Frequency Provider Last Rate Last Dose  . ceFAZolin (ANCEF) 2-4 GM/100ML-% IVPB           . 0.9 %  sodium chloride infusion   Intravenous Continuous Dew, Erskine Squibb, MD      . ceFAZolin (ANCEF) IVPB 2g/100 mL premix  2 g Intravenous Once Algernon Huxley, MD        Past Medical History:  Diagnosis Date  . Arthritis   . Chronic kidney disease   . Diabetes mellitus without complication ([place])   . Hyperlipidemia   . Hypertension   . Peripheral vascular disease [facility])     Past Surgical History:  Procedure Laterality Date  . AV FISTULA PLACEMENT Bilateral   . AV FISTULA PLACEMENT Left 07/01/2015   Procedure: INSERTION OF ARTERIOVENOUS (AV) GRAFT THIGH (ARTEGRAFT);  Surgeon: Algernon Huxley, MD;  Location: [facility] ORS;  Service: Vascular;  Laterality: Left;  . BELOW KNEE LEG AMPUTATION Left   . ENDARTERECTOMY FEMORAL Left  07/01/2015   Procedure: SUPERFICIAL FEMORAL ENDARTERECTOMY ;  Surgeon: Algernon Huxley, MD;  Location: [facility] OR[facility];  Service: Vascular;  Laterality: Left;  . EXCHANGE OF A DIALYSIS CATHETER  02/11/2015   Procedure: Exchange Of A Dialysis Catheter;  Surgeon: Algernon Huxley, MD;  Location: [place] [facility];  Service: Cardiovascular;;  . LEG AMPUTATION ABOVE KNEE Right   . PERIPHERAL VASCULAR CATHETERIZATION N/A 02/11/2015   Procedure: Dialysis/Perma Catheter Insertion;  Surgeon: Algernon Huxley, MD;  Location: [place] [facility];  Service: Cardiovascular;  Laterality: N/A;  . PERIPHERAL VASCULAR CATHETERIZATION N/A 05/11/2015   Procedure: Dialysis/Perma Catheter Insertion, exchange;  Surgeon: Katha Cabal, MD;  Location: [place] [facility];  Service: Cardiovascular;  Laterality: N/A;  . PERIPHERAL VASCULAR CATHETERIZATION N/A 05/18/2015   Procedure: Dialysis/Perma Catheter Insertion;  Surgeon: Katha Cabal, MD;  Location: [place] [facility];  Service: Cardiovascular;  Laterality: N/A;  . PERIPHERAL VASCULAR CATHETERIZATION Left 07/19/2015   Procedure: A/V Shuntogram/Fistulagram;  Surgeon: Algernon Huxley, MD;  Location: [place] [facility];  Service: Cardiovascular;  Laterality: Left;  . PERIPHERAL VASCULAR CATHETERIZATION N/A 07/19/2015   Procedure: A/V Shunt Intervention;  Surgeon: Algernon Huxley, MD;  Location: [place] [facility];  Service: Cardiovascular;  Laterality: N/A;  . PERIPHERAL VASCULAR CATHETERIZATION N/A 08/26/2015   Procedure: Dialysis/Perma Catheter Removal;  Surgeon: Algernon Huxley, MD;  Location: [place] [facility];  Service: Cardiovascular;  Laterality: N/A;  .  PERIPHERAL VASCULAR THROMBECTOMY N/A 12/29/2016   Procedure: Peripheral Vascular Thrombectomy;  Surgeon: Katha Cabal, MD;  Location: [place] [facility];  Service: Cardiovascular;  Laterality: N/A;    Social History Social History  Substance Use Topics  . Smoking status: Never Smoker  . Smokeless tobacco: Never Used   . Alcohol use No  No IVDU  Family History Family History  Problem Relation Age of Onset  . Breast cancer Neg Hx     No family history of bleeding or clotting disorders, autoimmune disease or porphyria  No Known Allergies   REVIEW OF SYSTEMS (Negative unless checked)  Constitutional: [] Weight loss  [] Fever  [] Chills Cardiac: [] Chest pain   [] Chest pressure   [x] Palpitations   [] Shortness of breath when laying flat   [] Shortness of breath at rest   [] Shortness of breath with exertion. Vascular:  [] Pain in legs with walking   [] Pain in legs at rest   [] Pain in legs when laying flat   [] Claudication   [] Pain in feet when walking  [] Pain in feet at rest  [] Pain in feet when laying flat   [] History of DVT   [] Phlebitis   [] Swelling in legs   [] Varicose veins   [] Non-healing ulcers Pulmonary:   [] Uses home oxygen   [] Productive cough   [] Hemoptysis   [] Wheeze  [] COPD   [] Asthma Neurologic:  [] Dizziness  [] Blackouts   [] Seizures   [] History of stroke   [] History of TIA  [] Aphasia   [] Temporary blindness   [] Dysphagia   [] Weakness or numbness in arms   [] Weakness or numbness in legs Musculoskeletal:  [] Arthritis   [] Joint swelling   [] Joint pain   [] Low back pain Hematologic:  [] Easy bruising  [] Easy bleeding   [] Hypercoagulable state   [] Anemic  [] Hepatitis Gastrointestinal:  [] Blood in stool   [] Vomiting blood  [] Gastroesophageal reflux/heartburn   [] Difficulty swallowing. Genitourinary:  [x] Chronic kidney disease   [] Difficult urination  [] Frequent urination  [] Burning with urination   [] Blood in urine Skin:  [] Rashes   [] Ulcers   [] Wounds Psychological:  [] History of anxiety   []  History of major depression.  Physical Examination  Vitals:   02/16/17 1208  BP: 105/63  Pulse: 73  Resp: 16  Temp: 97.8 F (36.6 C)  TempSrc: Oral  SpO2: 100%  Weight: 93.9 kg (207 lb)  Height: 4\' 9"  (1.448 m)   Body mass index is 44.79 kg/m. Gen: WD/WN, NAD Head: [place]/AT, No temporalis wasting.  Prominent temp pulse not noted. Ear/Nose/Throat: Hearing grossly intact, nares w/o erythema or drainage, oropharynx w/o Erythema/Exudate,  Eyes: Conjunctiva clear, sclera non-icteric Neck: Trachea midline.  No JVD.  Pulmonary:  Good air movement, respirations not labored, no use of accessory muscles.  Cardiac: RRR, normal S1, S2. Vascular: pulsatile graft left thigh.  BLE amputations Vessel Right Left  Radial Palpable Palpable  Ulnar Not Palpable Not Palpable  Brachial Palpable Palpable  Carotid Palpable, without bruit Palpable, without bruit   Musculoskeletal: M/S 5/5 throughout. Uses a wheelchair Neurologic: Sensation grossly intact in extremities.  Symmetrical.  Speech is fluent. Motor exam as listed above. Psychiatric: Judgment intact, Mood & affect appropriate for pt's clinical situation. Dermatologic: No rashes or ulcers noted.  No cellulitis or open wounds. Lymph : No Cervical, Axillary, or Inguinal lymphadenopathy.   CBC Lab Results  Component Value Date   WBC 9.9 06/22/2015   HGB 15.3 (H) 07/01/2015   HCT 45.0 07/01/2015   MCV 78.7 (L) 06/22/2015   PLT 169 06/22/2015  BMET    Component Value Date/Time   NA 139 07/01/2015 1103   NA 138 12/03/2012 1414   K 4.9 07/01/2015 1103   K 4.5 12/03/2012 1414   CL 102 06/22/2015 1606   CL 101 12/03/2012 1414   CO2 27 06/22/2015 1606   CO2 27 12/03/2012 1414   GLUCOSE 124 (H) 07/01/2015 1103   GLUCOSE 326 (H) 12/03/2012 1414   BUN 38 (H) 06/22/2015 1606   BUN 37 (H) 12/03/2012 1414   CREATININE 7.47 (H) 06/22/2015 1606   CREATININE 7.04 (H) 12/03/2012 1414   CALCIUM 9.5 06/22/2015 1606   CALCIUM 8.4 (L) 12/03/2012 1414   GFRNONAA 5 (L) 06/22/2015 1606   GFRNONAA 6 (L) 12/03/2012 1414   GFRAA 6 (L) 06/22/2015 1606   GFRAA 7 (L) 12/03/2012 1414   CrCl cannot be calculated (Patient's most recent lab result is older than the maximum 21 days allowed.).  COAG Lab Results  Component Value Date   INR 1.11 06/22/2015    INR 1.0 07/26/2011    Radiology No results found.  Assessment/Plan 1.  Complication dialysis device with thrombosis AV access:  Patient's left thigh dialysis access is malfunctioning. The patient will undergo angiography and correction of any problems using interventional techniques with the hope of restoring function to the access.  The risks and benefits were described to the patient.  All questions were answered.  The patient agrees to proceed with angiography and intervention. Potassium will be drawn to ensure that it is an appropriate level prior to performing intervention. 2.  End-stage renal disease requiring hemodialysis:  Patient will continue dialysis therapy without further interruption if a successful intervention is not achieved then a tunneled catheter will be placed. Dialysis has already been arranged. 3.  Hypertension:  Patient will continue medical management; nephrology is following no changes in oral medications. 4. Diabetes mellitus:  Glucose will be monitored and oral medications been held this morning once the patient has undergone the patient's procedure po intake will be reinitiated and again Accu-Cheks will be used to assess the blood glucose level and treat as needed. The patient will be restarted on the patient's usual hypoglycemic regime     [name], MD  02/16/2017 12:47 PM

## 2017-02-16 NOTE — Telephone Encounter (Signed)
Patient is scheduled for an fistula for today.

## 2017-02-16 NOTE — Op Note (Signed)
[facility]    OPERATIVE NOTE   PROCEDURE: 1.  Left femoral artery to femoral vein arteriovenous graft cannulation under ultrasound guidance 2.  Left leg shuntogram 3.  Percutaneous transluminal angioplasty of the venous anastomosis and distal graft with 8 mm diameter by 4 cm length Lutonix drug-coated angioplasty balloon  PRE-OPERATIVE DIAGNOSIS: 1. ESRD 2. Malfunctioning left thigh arteriovenous graft  POST-OPERATIVE DIAGNOSIS: same as above   SURGEON: [name], MD  ANESTHESIA: local with MCS  ESTIMATED BLOOD LOSS: minimal  FINDING(S): 1. 60-70% stenosis at the venous anastomosis region just beyond previously placed stents. The remainder of the graft was patent. The remainder of the venous outflow was widely patent.  SPECIMEN(S):  None  CONTRAST: 20 cc  FLUORO TIME: 1.0 minute  MODERATE CONSCIOUS SEDATION TIME:  Approximately 20 minutes using 2 mg of Versed and 50 mcg of Fentanyl  INDICATIONS: Amy Wu is a 66 y.o. female who presents with malfunctioning left thigh arteriovenous graft.  The patient is scheduled for left thigh shuntogram.  The patient is aware the risks include but are not limited to: bleeding, infection, thrombosis of the cannulated access, and possible anaphylactic reaction to the contrast.  The patient is aware of the risks of the procedure and elects to proceed forward.  DESCRIPTION: After full informed written consent was obtained, the patient was brought back to the angiography suite and placed supine upon the angiography table.  The patient was connected to monitoring equipment. Moderate conscious sedation was administered during a face to face encounter throughout the procedure with my supervision of the RN administering medicines and monitoring the patient's vital signs, pulse oximetry, telemetry and mental status throughout from the start of the procedure until the patient was taken to the recovery room The left thigh was  prepped and draped in the standard fashion for a percutaneous access intervention.  Under ultrasound guidance, the left femoral artery to femoral vein arteriovenous graft was cannulated with a micropuncture needle under direct ultrasound guidance and a permanent image was performed.  The microwire was advanced into the graft and the needle was exchanged for the a microsheath.  I then upsized to a 6 Fr Sheath and imaging was performed.  Hand injections were completed to image the access including the central venous system. This demonstrated 60-70% stenosis at the venous anastomosis region just beyond previously placed stents. The remainder of the graft was patent. The remainder of the venous outflow was widely patent.  Based on the images, this patient will need intervention to this stenosis. I then gave the patient 3000 units of intravenous heparin.  I then crossed the stenosis with a Magic Tourqe wire.  Based on the imaging, a 8 mm x 4 cm  Lutonix drug-coated angioplasty balloon was selected.  The balloon was centered around the venous anastomotic stenosis and inflated to 14 ATM for 1 minute(s).  On completion imaging, a 10-15% residual stenosis was present.     Based on the completion imaging, no further intervention is necessary.  The wire and balloon were removed from the sheath.  A 4-0 Monocryl purse-string suture was sewn around the sheath.  The sheath was removed while tying down the suture.  A sterile bandage was applied to the puncture site.  COMPLICATIONS: None  CONDITION: Stable   [name]  02/16/2017 2:05 PM    This note was created with Dragon Medical transcription system. Any errors in dictation are purely unintentional.

## 2017-02-20 ENCOUNTER — Encounter: Payer: Self-pay | Admitting: Vascular Surgery

## 2017-03-06 ENCOUNTER — Ambulatory Visit (INDEPENDENT_AMBULATORY_CARE_PROVIDER_SITE_OTHER): Payer: Medicare (Managed Care) | Admitting: Vascular Surgery

## 2017-03-06 ENCOUNTER — Ambulatory Visit (INDEPENDENT_AMBULATORY_CARE_PROVIDER_SITE_OTHER): Payer: Medicare (Managed Care)

## 2017-03-06 VITALS — BP 93/61 | HR 73 | Resp 14 | Ht <= 58 in | Wt 210.0 lb

## 2017-03-06 DIAGNOSIS — N186 End stage renal disease: Secondary | ICD-10-CM

## 2017-03-06 DIAGNOSIS — E118 Type 2 diabetes mellitus with unspecified complications: Secondary | ICD-10-CM | POA: Diagnosis not present

## 2017-03-06 DIAGNOSIS — Z992 Dependence on renal dialysis: Secondary | ICD-10-CM

## 2017-03-06 DIAGNOSIS — Z794 Long term (current) use of insulin: Secondary | ICD-10-CM

## 2017-03-06 DIAGNOSIS — E785 Hyperlipidemia, unspecified: Secondary | ICD-10-CM

## 2017-03-06 NOTE — Progress Notes (Signed)
MRN : [id number]  Amy Wu is a 66 y.o. ([birth year]) female who presents with chief complaint of No chief complaint on file. Marland Kitchen  History of Present Illness: Patient returns today in follow up of dialysis access. After intervention a few weeks ago, the access has been working well without any major issues. She has to stay on the dialysis center to rotate the access sites as to not wear out the graft. Duplex today shows a widely patent graft without significant stenosis present.  Current Outpatient Prescriptions  Medication Sig Dispense Refill  . acetaminophen (TYLENOL) 500 MG tablet Take 1,000 mg by mouth every 8 (eight) hours as needed for moderate pain.    Marland Kitchen aspirin 81 MG tablet Take 81 mg by mouth daily.    Marland Kitchen atorvastatin (LIPITOR) 40 MG tablet Take 40 mg by mouth daily.    . camphor-menthol (SARNA) lotion Apply 1 application topically as needed for itching. Twice a day if needed    . Cholecalciferol (VITAMIN D3) 2000 units TABS Take 2,000 Units by mouth daily.    . cinacalcet (SENSIPAR) 30 MG tablet Take 30 mg by mouth daily with supper.     . ferrous sulfate 325 (65 FE) MG tablet Take 325 mg by mouth every other day.    Marland Kitchen glucose 4 GM chewable tablet Chew 1 tablet by mouth as needed for low blood sugar. If blood sugar less than 70. Chew 3 tablets and recheck blood sugar in 15 minutes    . insulin aspart (NOVOLOG) 100 UNIT/ML injection Inject 1 Units into the skin See admin instructions. Pt uses insulin PRN for Blood Sugar greater then 200 uses 1 unit if greater then 200     . lanthanum (FOSRENOL) 1000 MG chewable tablet Chew 1,000 mg by mouth 3 (three) times daily with meals.     Marland Kitchen liver oil-zinc oxide (DESITIN) 40 % ointment Apply 1 application topically as needed for irritation.    Marland Kitchen loperamide (IMODIUM A-D) 2 MG tablet Take 2 mg by mouth 4 (four) times daily as needed for diarrhea or loose stools. 2 tablets by mouth for onset of loose stool, than 1 tablet by mouth after each loose  stool MAX 4 tablets in 24 hours    . loratadine (CLARITIN) 10 MG tablet Take 10 mg by mouth daily as needed for allergies.    Marland Kitchen nystatin (MYCOSTATIN) powder Apply 100,000 g topically 2 (two) times daily as needed. 100.000unit/gm     . Polyethyl Glycol-Propyl Glycol (SYSTANE) 0.4-0.3 % SOLN Place 1 drop into both eyes 4 (four) times daily as needed.     . polyethylene glycol (MIRALAX / GLYCOLAX) packet Take 17 g by mouth daily as needed for moderate constipation or severe constipation.    Marland Kitchen rOPINIRole (REQUIP) 0.25 MG tablet Take 0.25 mg by mouth at bedtime as needed.    . senna-docusate (SENOKOT-S) 8.6-50 MG tablet Take 1-2 tablets by mouth at bedtime as needed for mild constipation or moderate constipation. 1-2 tablets at bedtime if needed     . Skin Protectants, Misc. (EUCERIN) cream Apply 1 application topically 4 (four) times daily as needed for dry skin.      No current facility-administered medications for this visit.     Past Medical History:  Diagnosis Date  . Arthritis   . Chronic kidney disease   . Diabetes mellitus without complication ([place])   . Hyperlipidemia   . Hypertension   . Peripheral vascular disease ([place])  Past Surgical History:  Procedure Laterality Date  . A/V SHUNTOGRAM Left 02/16/2017   Procedure: A/V Shuntogram;  Surgeon: Algernon Huxley, MD;  Location: [place] [facility];  Service: Cardiovascular;  Laterality: Left;  . AV FISTULA PLACEMENT Bilateral   . AV FISTULA PLACEMENT Left 07/01/2015   Procedure: INSERTION OF ARTERIOVENOUS (AV) GRAFT THIGH (ARTEGRAFT);  Surgeon: Algernon Huxley, MD;  Location: [facility];  Service: Vascular;  Laterality: Left;  . BELOW KNEE LEG AMPUTATION Left   . ENDARTERECTOMY FEMORAL Left 07/01/2015   Procedure: SUPERFICIAL FEMORAL ENDARTERECTOMY ;  Surgeon: Algernon Huxley, MD;  Location: [facility];  Service: Vascular;  Laterality: Left;  . EXCHANGE OF A DIALYSIS CATHETER  02/11/2015   Procedure: Exchange Of A Dialysis Catheter;  Surgeon: Algernon Huxley, MD;  Location: [place] [facility];  Service: Cardiovascular;;  . LEG AMPUTATION ABOVE KNEE Right   . PERIPHERAL VASCULAR CATHETERIZATION N/A 02/11/2015   Procedure: Dialysis/Perma Catheter Insertion;  Surgeon: Algernon Huxley, MD;  Location: [place] [facility];  Service: Cardiovascular;  Laterality: N/A;  . PERIPHERAL VASCULAR CATHETERIZATION N/A 05/11/2015   Procedure: Dialysis/Perma Catheter Insertion, exchange;  Surgeon: Katha Cabal, MD;  Location: [place] [facility];  Service: Cardiovascular;  Laterality: N/A;  . PERIPHERAL VASCULAR CATHETERIZATION N/A 05/18/2015   Procedure: Dialysis/Perma Catheter Insertion;  Surgeon: Katha Cabal, MD;  Location: [place] [facility];  Service: Cardiovascular;  Laterality: N/A;  . PERIPHERAL VASCULAR CATHETERIZATION Left 07/19/2015   Procedure: A/V Shuntogram/Fistulagram;  Surgeon: Algernon Huxley, MD;  Location: [place] [facility];  Service: Cardiovascular;  Laterality: Left;  . PERIPHERAL VASCULAR CATHETERIZATION N/A 07/19/2015   Procedure: A/V Shunt Intervention;  Surgeon: Algernon Huxley, MD;  Location: [place] [facility];  Service: Cardiovascular;  Laterality: N/A;  . PERIPHERAL VASCULAR CATHETERIZATION N/A 08/26/2015   Procedure: Dialysis/Perma Catheter Removal;  Surgeon: Algernon Huxley, MD;  Location: [place] [facility];  Service: Cardiovascular;  Laterality: N/A;  . PERIPHERAL VASCULAR THROMBECTOMY N/A 12/29/2016   Procedure: Peripheral Vascular Thrombectomy;  Surgeon: Katha Cabal, MD;  Location: [place] [facility];  Service: Cardiovascular;  Laterality: N/A;     Social History     Social History  Substance Use Topics  . Smoking status: Never Smoker  . Smokeless tobacco: Not on file  . Alcohol use No     Family History      Family History  Problem Relation Age of Onset  . Breast cancer Neg Hx      No Known Allergies   REVIEW OF SYSTEMS(Negative unless checked)  Constitutional: [] Weight  loss[] Fever[] Chills Cardiac:[] Chest pain[] Chest pressure[] Palpitations [] Shortness of breath when laying flat [] Shortness of breath at rest [] Shortness of breath with exertion. Vascular: [] Pain in legs with walking[] Pain in legsat rest[] Pain in legs when laying flat [] Claudication [] Pain in feet when walking [] Pain in feet at rest [] Pain in feet when laying flat [] History of DVT [] Phlebitis [] Swelling in legs [] Varicose veins [] Non-healing ulcers Pulmonary: [] Uses home oxygen [] Productive cough[] Hemoptysis [] Wheeze [] COPD [] Asthma Neurologic: [] Dizziness [] Blackouts [] Seizures [] History of stroke [] History of TIA[] Aphasia [] Temporary blindness[] Dysphagia [] Weaknessor numbness in arms [] Weakness or numbnessin legs Musculoskeletal: [x] Arthritis [] Joint swelling [] Joint pain [] Low back pain Hematologic:[] Easy bruising[] Easy bleeding [] Hypercoagulable state [] Anemic  Gastrointestinal:[] Blood in stool[] Vomiting blood[] Gastroesophageal reflux/heartburn[] Abdominal pain Genitourinary: [x] Chronic kidney disease [] Difficulturination [] Frequenturination [] Burning with urination[] Hematuria Skin: [] Rashes [] Ulcers [] Wounds Psychological: [] History of anxiety[] History of major depression.   Physical Examination  There were no vitals taken for this visit. Gen:  WD/WN, NAD Head:  [place]/AT, No temporalis wasting. Ear/Nose/Throat: Hearing grossly intact, nares w/o erythema or drainage, trachea midline Eyes: Conjunctiva clear. Sclera non-icteric Neck: Supple.  No JVD.  Pulmonary:  Good air movement, no use of accessory muscles.  Cardiac: RRR, normal S1, S2 Vascular: Thrill present in left thigh AV graft Vessel Right Left  Radial Palpable Palpable                                   Musculoskeletal: Bilateral lower extremity amputee. Uses a motorized wheelchair Neurologic: Sensation  grossly intact in extremities.  Symmetrical.  Speech is fluent.  Psychiatric: Judgment intact, Mood & affect appropriate for pt's clinical situation. Dermatologic: No rashes or ulcers noted.  No cellulitis or open wounds.      Labs Recent Results (from the past 2160 hour(s))  Glucose, capillary     Status: Abnormal   Collection Time: 12/29/16 10:56 AM  Result Value Ref Range   Glucose-Capillary 119 (H) 65 - 99 mg/dL  Potassium [facility] vascular lab only)     Status: None   Collection Time: 12/29/16 11:20 AM  Result Value Ref Range   Potassium [facility] vascular lab) 4.5 3.5 - 5.1  Glucose, capillary     Status: Abnormal   Collection Time: 02/16/17 12:26 PM  Result Value Ref Range   Glucose-Capillary 115 (H) 65 - 99 mg/dL  Potassium [facility] vascular lab only)     Status: None   Collection Time: 02/16/17 12:30 PM  Result Value Ref Range   Potassium [facility] vascular lab) 4.3 3.5 - 5.1  Glucose, capillary     Status: Abnormal   Collection Time: 02/16/17  2:38 PM  Result Value Ref Range   Glucose-Capillary 137 (H) 65 - 99 mg/dL    Radiology No results found.   Assessment/Plan Diabetes (HCC) blood glucose control important in reducing the progression of atherosclerotic disease. Also, involved in wound healing. On appropriate medications.   Hyperlipidemia lipid control important in reducing the progression of atherosclerotic disease. Continue statin therapy  ESRD on dialysis [facility]) Her duplex today shows a widely patent left thigh femoral artery to femoral vein graft without significant stenosis. Currently she is doing pretty well. She does have a very limited dialysis access situation and multiple interventions have already been performed on this access. We will continue to follow this on 6 month intervals, and I would be happy to see her back at any point prior to that if necessary.  No problem-specific Assessment & Plan notes found for this encounter.    [name],  MD  03/06/2017 11:16 AM    This note was created with Dragon medical transcription system.  Any errors from dictation are purely unintentional

## 2017-04-25 ENCOUNTER — Other Ambulatory Visit: Payer: Self-pay | Admitting: Family

## 2017-04-25 DIAGNOSIS — Z0000 Encounter for general adult medical examination without abnormal findings: Secondary | ICD-10-CM

## 2017-05-22 ENCOUNTER — Encounter (INDEPENDENT_AMBULATORY_CARE_PROVIDER_SITE_OTHER): Payer: Medicare (Managed Care)

## 2017-05-22 ENCOUNTER — Ambulatory Visit (INDEPENDENT_AMBULATORY_CARE_PROVIDER_SITE_OTHER): Payer: Medicare (Managed Care) | Admitting: Vascular Surgery

## 2017-06-26 ENCOUNTER — Ambulatory Visit
Admission: RE | Admit: 2017-06-26 | Discharge: 2017-06-26 | Disposition: A | Discharge status: Home / Self Care | Payer: Medicare (Managed Care) | Source: Ambulatory Visit | Attending: Family | Admitting: Family

## 2017-06-26 DIAGNOSIS — Z1231 Encounter for screening mammogram for malignant neoplasm of breast: Secondary | ICD-10-CM | POA: Insufficient documentation

## 2017-06-26 DIAGNOSIS — Z0000 Encounter for general adult medical examination without abnormal findings: Secondary | ICD-10-CM

## 2017-09-04 ENCOUNTER — Encounter (INDEPENDENT_AMBULATORY_CARE_PROVIDER_SITE_OTHER): Payer: Medicare (Managed Care)

## 2017-09-04 ENCOUNTER — Ambulatory Visit (INDEPENDENT_AMBULATORY_CARE_PROVIDER_SITE_OTHER): Payer: Medicare (Managed Care) | Admitting: Vascular Surgery

## 2017-09-27 ENCOUNTER — Ambulatory Visit (INDEPENDENT_AMBULATORY_CARE_PROVIDER_SITE_OTHER): Payer: Medicare (Managed Care)

## 2017-09-27 ENCOUNTER — Ambulatory Visit (INDEPENDENT_AMBULATORY_CARE_PROVIDER_SITE_OTHER): Payer: Medicare (Managed Care) | Admitting: Vascular Surgery

## 2017-09-27 ENCOUNTER — Encounter (INDEPENDENT_AMBULATORY_CARE_PROVIDER_SITE_OTHER): Payer: Self-pay | Admitting: Vascular Surgery

## 2017-09-27 VITALS — BP 109/51 | HR 70 | Resp 16 | Ht 63.0 in | Wt 224.0 lb

## 2017-09-27 DIAGNOSIS — N186 End stage renal disease: Secondary | ICD-10-CM

## 2017-09-27 DIAGNOSIS — Z794 Long term (current) use of insulin: Secondary | ICD-10-CM

## 2017-09-27 DIAGNOSIS — Z992 Dependence on renal dialysis: Secondary | ICD-10-CM

## 2017-09-27 DIAGNOSIS — E785 Hyperlipidemia, unspecified: Secondary | ICD-10-CM | POA: Diagnosis not present

## 2017-09-27 DIAGNOSIS — E118 Type 2 diabetes mellitus with unspecified complications: Secondary | ICD-10-CM

## 2017-09-27 NOTE — Progress Notes (Signed)
Subjective:    Patient ID: Amy Wu, female    DOB: April 19, 1951, 67 y.o.   MRN: [id number] Chief Complaint  Patient presents with  . Follow-up    6 month HDA u/s   Patient presents for a 41-month HDA follow-up. The patient underwent a duplex ultrasound of the AV access which was notable for a patent fistula without any significant hemodynamic stenosis. Patient reports his hemodialysis doppler flow is stable. The patient denies any issues with hemodialysis such as cannulation problems, increased bleeding, decrease in doppler flow or recirculation. The patient also denies any fistula skin breakdown, pain, edema, pallor or ulceration of the thigh / leg.  The patient denies any uremic symptoms.  The patient denies any fever, nausea vomiting.  Review of Systems  Constitutional: Negative.   HENT: Negative.   Eyes: Negative.   Respiratory: Negative.   Cardiovascular: Negative.   Gastrointestinal: Negative.   Endocrine: Negative.   Genitourinary:       ESRD  Musculoskeletal: Negative.   Skin: Negative.   Allergic/Immunologic: Negative.   Neurological: Negative.   Hematological: Negative.   Psychiatric/Behavioral: Negative.       Objective:   Physical Exam  Constitutional: She is oriented to person, place, and time. She appears well-developed and well-nourished. No distress.  HENT:  Head: Normocephalic and atraumatic.  Eyes: Pupils are equal, round, and reactive to light. Conjunctivae and EOM are normal.  Neck: Normal range of motion.  Cardiovascular: Normal rate, regular rhythm, normal heart sounds and intact distal pulses.  Pulmonary/Chest: Effort normal and breath sounds normal.  Musculoskeletal: Normal range of motion.  Bilateral lower extremity amputee  Neurological: She is alert and oriented to person, place, and time.  Skin: Skin is warm and dry. She is not diaphoretic.  Psychiatric: She has a normal mood and affect. Her behavior is normal. Judgment and thought content  normal.   BP (!) 109/51 (BP Location: Left Arm, Patient Position: Sitting)   Pulse 70   Resp 16   Ht 5\' 3"  (1.6 m)   Wt 224 lb (101.6 kg)   BMI 39.68 kg/m   Past Medical History:  Diagnosis Date  . Arthritis   . Chronic kidney disease   . Diabetes mellitus without complication ([place])   . Hyperlipidemia   . Hypertension   . Peripheral vascular disease ([place])    Social History   Socioeconomic History  . Marital status: Single    Spouse name: Not on file  . Number of children: Not on file  . Years of education: Not on file  . Highest education level: Not on file  Occupational History  . Not on file  Social Needs  . Financial resource strain: Not on file  . Food insecurity:    Worry: Not on file    Inability: Not on file  . Transportation needs:    Medical: Not on file    Non-medical: Not on file  Tobacco Use  . Smoking status: Never Smoker  . Smokeless tobacco: Never Used  Substance and Sexual Activity  . Alcohol use: No  . Drug use: No  . Sexual activity: Not on file  Lifestyle  . Physical activity:    Days per week: Not on file    Minutes per session: Not on file  . Stress: Not on file  Relationships  . Social connections:    Talks on phone: Not on file    Gets together: Not on file    Attends religious service: Not  on file    Active member of club or organization: Not on file    Attends meetings of clubs or organizations: Not on file    Relationship status: Not on file  . Intimate partner violence:    Fear of current or ex partner: Not on file    Emotionally abused: Not on file    Physically abused: Not on file    Forced sexual activity: Not on file  Other Topics Concern  . Not on file  Social History Narrative  . Not on file   Past Surgical History:  Procedure Laterality Date  . A/V SHUNTOGRAM Left 02/16/2017   Procedure: A/V Shuntogram;  Surgeon: Algernon Huxley, MD;  Location: [place] [facility];  Service: Cardiovascular;  Laterality: Left;  . AV  FISTULA PLACEMENT Bilateral   . AV FISTULA PLACEMENT Left 07/01/2015   Procedure: INSERTION OF ARTERIOVENOUS (AV) GRAFT THIGH (ARTEGRAFT);  Surgeon: Algernon Huxley, MD;  Location: [facility];  Service: Vascular;  Laterality: Left;  . BELOW KNEE LEG AMPUTATION Left   . ENDARTERECTOMY FEMORAL Left 07/01/2015   Procedure: SUPERFICIAL FEMORAL ENDARTERECTOMY ;  Surgeon: Algernon Huxley, MD;  Location: [facility];  Service: Vascular;  Laterality: Left;  . EXCHANGE OF A DIALYSIS CATHETER  02/11/2015   Procedure: Exchange Of A Dialysis Catheter;  Surgeon: Algernon Huxley, MD;  Location: [place] [facility];  Service: Cardiovascular;;  . LEG AMPUTATION ABOVE KNEE Right   . PERIPHERAL VASCULAR CATHETERIZATION N/A 02/11/2015   Procedure: Dialysis/Perma Catheter Insertion;  Surgeon: Algernon Huxley, MD;  Location: [place] [facility];  Service: Cardiovascular;  Laterality: N/A;  . PERIPHERAL VASCULAR CATHETERIZATION N/A 05/11/2015   Procedure: Dialysis/Perma Catheter Insertion, exchange;  Surgeon: Katha Cabal, MD;  Location: [place] [facility];  Service: Cardiovascular;  Laterality: N/A;  . PERIPHERAL VASCULAR CATHETERIZATION N/A 05/18/2015   Procedure: Dialysis/Perma Catheter Insertion;  Surgeon: Katha Cabal, MD;  Location: [place] [facility];  Service: Cardiovascular;  Laterality: N/A;  . PERIPHERAL VASCULAR CATHETERIZATION Left 07/19/2015   Procedure: A/V Shuntogram/Fistulagram;  Surgeon: Algernon Huxley, MD;  Location: [place] [facility];  Service: Cardiovascular;  Laterality: Left;  . PERIPHERAL VASCULAR CATHETERIZATION N/A 07/19/2015   Procedure: A/V Shunt Intervention;  Surgeon: Algernon Huxley, MD;  Location: [place] [facility];  Service: Cardiovascular;  Laterality: N/A;  . PERIPHERAL VASCULAR CATHETERIZATION N/A 08/26/2015   Procedure: Dialysis/Perma Catheter Removal;  Surgeon: Algernon Huxley, MD;  Location: [place] [facility];  Service: Cardiovascular;  Laterality: N/A;  . PERIPHERAL VASCULAR THROMBECTOMY N/A  12/29/2016   Procedure: Peripheral Vascular Thrombectomy;  Surgeon: Katha Cabal, MD;  Location: [place] [facility];  Service: Cardiovascular;  Laterality: N/A;   Family History  Problem Relation Age of Onset  . Breast cancer Neg Hx    Allergies  Allergen Reactions  . Ancef [Cefazolin] Palpitations  . Contrast Media [Iodinated Diagnostic Agents] Palpitations      Assessment & Plan:  Patient presents for a 31-month HDA follow-up. The patient underwent a duplex ultrasound of the AV access which was notable for a patent fistula without any significant hemodynamic stenosis. Patient reports his hemodialysis doppler flow is stable. The patient denies any issues with hemodialysis such as cannulation problems, increased bleeding, decrease in doppler flow or recirculation. The patient also denies any fistula skin breakdown, pain, edema, pallor or ulceration of the thigh / leg.  The patient denies any uremic symptoms.  The patient denies any fever,  nausea vomiting.  1. ESRD on dialysis ([place]) - Stable Studies reviewed with patient. The patient is doing well and currently has adequate dialysis access. Duplex ultrasound of the AV access shows a patent access with no evidence of hemodynamically significant strictures or stenosis.  The patient should continue to have duplex ultrasounds of the dialysis access every six months. The patient was instructed to call the office in the interim if any issues with dialysis access / doppler flow, pain, edema, pallor, fistula skin breakdown or ulceration of the arm / hand occur. The patient expressed their understanding.  - VAS US DUPLEX DIALYSIS ACCESS (AVF,AVG); Future  2. Hyperlipidemia, unspecified hyperlipidemia type - Stable Encouraged good control as its slows the progression of atherosclerotic disease  3. Type 2 diabetes mellitus with complication, with long-term current use of insulin ([facility]) - Stable Encouraged good control as its slows the  progression of atherosclerotic disease  Current Outpatient Medications on File Prior to Visit  Medication Sig Dispense Refill  . acetaminophen (TYLENOL) 500 MG tablet Take 1,000 mg by mouth every 8 (eight) hours as needed for moderate pain.    Marland Kitchen aspirin 81 MG tablet Take 81 mg by mouth daily.    Marland Kitchen atorvastatin (LIPITOR) 40 MG tablet Take 40 mg by mouth daily.    . camphor-menthol (SARNA) lotion Apply 1 application topically as needed for itching. Twice a day if needed    . Cholecalciferol (VITAMIN D3) 2000 units TABS Take 2,000 Units by mouth daily.    . cinacalcet (SENSIPAR) 30 MG tablet Take 30 mg by mouth daily with supper.     . Dextromethorphan-guaiFENesin (MUCINEX DM) 30-600 MG TB12 Take by mouth.    . ferrous sulfate 325 (65 FE) MG tablet Take 325 mg by mouth every other day.    Marland Kitchen glucose 4 GM chewable tablet Chew 1 tablet by mouth as needed for low blood sugar. If blood sugar less than 70. Chew 3 tablets and recheck blood sugar in 15 minutes    . insulin aspart (NOVOLOG) 100 UNIT/ML injection Inject 1 Units into the skin See admin instructions. Pt uses insulin PRN for Blood Sugar greater then 200 uses 1 unit if greater then 200     . lanthanum (FOSRENOL) 1000 MG chewable tablet Chew 1,000 mg by mouth 3 (three) times daily with meals.     Marland Kitchen liver oil-zinc oxide (DESITIN) 40 % ointment Apply 1 application topically as needed for irritation.    Marland Kitchen loperamide (IMODIUM A-D) 2 MG tablet Take 2 mg by mouth 4 (four) times daily as needed for diarrhea or loose stools. 2 tablets by mouth for onset of loose stool, than 1 tablet by mouth after each loose stool MAX 4 tablets in 24 hours    . loratadine (CLARITIN) 10 MG tablet Take 10 mg by mouth daily as needed for allergies.    Marland Kitchen nystatin (MYCOSTATIN) powder Apply 100,000 g topically 2 (two) times daily as needed. 100.000unit/gm     . Polyethyl Glycol-Propyl Glycol (SYSTANE) 0.4-0.3 % SOLN Place 1 drop into both eyes 4 (four) times daily as needed.       . polyethylene glycol (MIRALAX / GLYCOLAX) packet Take 17 g by mouth daily as needed for moderate constipation or severe constipation.    Marland Kitchen rOPINIRole (REQUIP) 0.25 MG tablet Take 0.25 mg by mouth at bedtime as needed.    . senna-docusate (SENOKOT-S) 8.6-50 MG tablet Take 1-2 tablets by mouth at bedtime as needed for mild constipation or moderate constipation.  1-2 tablets at bedtime if needed     . Skin Protectants, Misc. (EUCERIN) cream Apply 1 application topically 4 (four) times daily as needed for dry skin.      No current facility-administered medications on file prior to visit.    There are no Patient Instructions on file for this visit. No follow-ups on file.  Demarious Kapur A Andersyn Fragoso, PA-C

## 2017-12-04 ENCOUNTER — Encounter (INDEPENDENT_AMBULATORY_CARE_PROVIDER_SITE_OTHER): Payer: Self-pay

## 2017-12-10 ENCOUNTER — Other Ambulatory Visit (INDEPENDENT_AMBULATORY_CARE_PROVIDER_SITE_OTHER): Payer: Self-pay | Admitting: Vascular Surgery

## 2017-12-13 ENCOUNTER — Encounter: Payer: Self-pay | Admitting: *Deleted

## 2017-12-13 ENCOUNTER — Telehealth (INDEPENDENT_AMBULATORY_CARE_PROVIDER_SITE_OTHER): Payer: Self-pay | Admitting: Vascular Surgery

## 2017-12-13 ENCOUNTER — Encounter
Admission: RE | Disposition: A | Discharge status: Home / Self Care | Payer: Self-pay | Source: Ambulatory Visit | Attending: Vascular Surgery

## 2017-12-13 ENCOUNTER — Ambulatory Visit
Admission: RE | Admit: 2017-12-13 | Discharge: 2017-12-13 | Disposition: A | Discharge status: Home / Self Care | Payer: Medicare (Managed Care) | Source: Ambulatory Visit | Attending: Vascular Surgery | Admitting: Vascular Surgery

## 2017-12-13 DIAGNOSIS — T82858A Stenosis of vascular prosthetic devices, implants and grafts, initial encounter: Secondary | ICD-10-CM | POA: Diagnosis not present

## 2017-12-13 DIAGNOSIS — E1151 Type 2 diabetes mellitus with diabetic peripheral angiopathy without gangrene: Secondary | ICD-10-CM | POA: Diagnosis not present

## 2017-12-13 DIAGNOSIS — Z89611 Acquired absence of right leg above knee: Secondary | ICD-10-CM | POA: Insufficient documentation

## 2017-12-13 DIAGNOSIS — E785 Hyperlipidemia, unspecified: Secondary | ICD-10-CM | POA: Insufficient documentation

## 2017-12-13 DIAGNOSIS — Z89512 Acquired absence of left leg below knee: Secondary | ICD-10-CM | POA: Insufficient documentation

## 2017-12-13 DIAGNOSIS — I120 Hypertensive chronic kidney disease with stage 5 chronic kidney disease or end stage renal disease: Secondary | ICD-10-CM | POA: Diagnosis not present

## 2017-12-13 DIAGNOSIS — M1990 Unspecified osteoarthritis, unspecified site: Secondary | ICD-10-CM | POA: Diagnosis not present

## 2017-12-13 DIAGNOSIS — Z881 Allergy status to other antibiotic agents status: Secondary | ICD-10-CM | POA: Diagnosis not present

## 2017-12-13 DIAGNOSIS — Z9889 Other specified postprocedural states: Secondary | ICD-10-CM | POA: Diagnosis not present

## 2017-12-13 DIAGNOSIS — Z992 Dependence on renal dialysis: Secondary | ICD-10-CM | POA: Insufficient documentation

## 2017-12-13 DIAGNOSIS — N186 End stage renal disease: Secondary | ICD-10-CM | POA: Insufficient documentation

## 2017-12-13 DIAGNOSIS — Z91041 Radiographic dye allergy status: Secondary | ICD-10-CM | POA: Insufficient documentation

## 2017-12-13 DIAGNOSIS — E1122 Type 2 diabetes mellitus with diabetic chronic kidney disease: Secondary | ICD-10-CM | POA: Insufficient documentation

## 2017-12-13 DIAGNOSIS — Y832 Surgical operation with anastomosis, bypass or graft as the cause of abnormal reaction of the patient, or of later complication, without mention of misadventure at the time of the procedure: Secondary | ICD-10-CM | POA: Insufficient documentation

## 2017-12-13 DIAGNOSIS — E119 Type 2 diabetes mellitus without complications: Secondary | ICD-10-CM | POA: Diagnosis not present

## 2017-12-13 DIAGNOSIS — I10 Essential (primary) hypertension: Secondary | ICD-10-CM | POA: Diagnosis not present

## 2017-12-13 DIAGNOSIS — T82868A Thrombosis of vascular prosthetic devices, implants and grafts, initial encounter: Secondary | ICD-10-CM | POA: Diagnosis not present

## 2017-12-13 HISTORY — PX: A/V SHUNTOGRAM: CATH118297

## 2017-12-13 LAB — GLUCOSE, CAPILLARY
Glucose-Capillary: 111 mg/dL — ABNORMAL HIGH (ref 70–99)
Glucose-Capillary: 124 mg/dL — ABNORMAL HIGH (ref 70–99)

## 2017-12-13 LAB — POTASSIUM (ARMC VASCULAR LAB ONLY): Potassium (ARMC vascular lab): 4.5 (ref 3.5–5.1)

## 2017-12-13 SURGERY — A/V SHUNTOGRAM
Anesthesia: Moderate Sedation | Laterality: Left

## 2017-12-13 MED ORDER — METHYLPREDNISOLONE SODIUM SUCC 125 MG IJ SOLR: 125.0000 mg | INTRAMUSCULAR | Status: DC | PRN

## 2017-12-13 MED ORDER — FENTANYL CITRATE (PF) 100 MCG/2ML IJ SOLN
INTRAMUSCULAR | Status: DC | PRN
  Administered 2017-12-13: 50 ug via INTRAVENOUS

## 2017-12-13 MED ORDER — FAMOTIDINE 20 MG PO TABS
ORAL_TABLET | ORAL | Status: AC
  Administered 2017-12-13: 40 mg
  Filled 2017-12-13: qty 2

## 2017-12-13 MED ORDER — ONDANSETRON HCL 4 MG/2ML IJ SOLN: 4.0000 mg | Freq: Four times a day (QID) | INTRAMUSCULAR | Status: DC | PRN

## 2017-12-13 MED ORDER — HEPARIN SODIUM (PORCINE) 1000 UNIT/ML IJ SOLN
INTRAMUSCULAR | Status: DC | PRN
  Administered 2017-12-13: 3000 [IU] via INTRAVENOUS

## 2017-12-13 MED ORDER — HEPARIN (PORCINE) IN NACL 1000-0.9 UT/500ML-% IV SOLN
INTRAVENOUS | Status: AC
  Filled 2017-12-13: qty 1000

## 2017-12-13 MED ORDER — METHYLPREDNISOLONE SODIUM SUCC 125 MG IJ SOLR
INTRAMUSCULAR | Status: AC
  Administered 2017-12-13: 125 mg
  Filled 2017-12-13: qty 2

## 2017-12-13 MED ORDER — DIPHENHYDRAMINE HCL 50 MG/ML IJ SOLN: 25.0000 mg | Freq: Once | INTRAMUSCULAR | Status: DC

## 2017-12-13 MED ORDER — FENTANYL CITRATE (PF) 100 MCG/2ML IJ SOLN
INTRAMUSCULAR | Status: AC
  Filled 2017-12-13: qty 2

## 2017-12-13 MED ORDER — HEPARIN SODIUM (PORCINE) 1000 UNIT/ML IJ SOLN
INTRAMUSCULAR | Status: AC
  Filled 2017-12-13: qty 1

## 2017-12-13 MED ORDER — IOPAMIDOL (ISOVUE-300) INJECTION 61%
INTRAVENOUS | Status: DC | PRN
  Administered 2017-12-13: 20 mL via INTRA_ARTERIAL

## 2017-12-13 MED ORDER — MIDAZOLAM HCL 2 MG/2ML IJ SOLN
INTRAMUSCULAR | Status: DC | PRN
  Administered 2017-12-13: 2 mg via INTRAVENOUS

## 2017-12-13 MED ORDER — CLINDAMYCIN PHOSPHATE 300 MG/50ML IV SOLN
300.0000 mg | Freq: Once | INTRAVENOUS | Status: AC
  Administered 2017-12-13: 300 mg via INTRAVENOUS

## 2017-12-13 MED ORDER — LORAZEPAM 2 MG/ML IJ SOLN: 2.0000 mg | Freq: Once | INTRAMUSCULAR | Status: DC

## 2017-12-13 MED ORDER — HYDROMORPHONE HCL 1 MG/ML IJ SOLN: 1.0000 mg | Freq: Once | INTRAMUSCULAR | Status: DC | PRN

## 2017-12-13 MED ORDER — DIPHENHYDRAMINE HCL 50 MG/ML IJ SOLN
INTRAMUSCULAR | Status: AC
  Administered 2017-12-13: 25 mg
  Filled 2017-12-13: qty 1

## 2017-12-13 MED ORDER — MIDAZOLAM HCL 5 MG/5ML IJ SOLN
INTRAMUSCULAR | Status: AC
  Filled 2017-12-13: qty 5

## 2017-12-13 MED ORDER — CLINDAMYCIN PHOSPHATE 300 MG/50ML IV SOLN
INTRAVENOUS | Status: AC
  Administered 2017-12-13: 300 mg via INTRAVENOUS
  Filled 2017-12-13: qty 50

## 2017-12-13 MED ORDER — LIDOCAINE-EPINEPHRINE 1 %-1:200000 IJ SOLN
INTRAMUSCULAR | Status: AC
Start: 2017-12-13 — End: ?
  Filled 2017-12-13: qty 30

## 2017-12-13 MED ORDER — FAMOTIDINE 20 MG PO TABS: 40.0000 mg | ORAL_TABLET | ORAL | Status: DC | PRN

## 2017-12-13 MED ORDER — SODIUM CHLORIDE 0.9 % IV SOLN: INTRAVENOUS | Status: DC

## 2017-12-13 SURGICAL SUPPLY — 10 items
BALLN DORADO 7X60X80 (BALLOONS) ×3
BALLOON DORADO 7X60X80 (BALLOONS) ×1 IMPLANT
CANNULA 5F STIFF (CANNULA) ×3 IMPLANT
DEVICE PRESTO INFLATION (MISCELLANEOUS) ×3 IMPLANT
GUIDEWIRE SUPER STIFF .035X180 (WIRE) ×3 IMPLANT
PACK ANGIOGRAPHY (CUSTOM PROCEDURE TRAY) ×3 IMPLANT
SHEATH BRITE TIP 5FRX11 (SHEATH) ×3 IMPLANT
SHEATH BRITE TIP 6FRX5.5 (SHEATH) ×3 IMPLANT
SUT MNCRL AB 4-0 PS2 18 (SUTURE) ×3 IMPLANT
WIRE MAGIC TOR.035 180C (WIRE) ×3 IMPLANT

## 2017-12-13 NOTE — H&P (Signed)
[facility] History & Physical Update  The patient was interviewed and re-examined.  The patient's previous History and Physical has been reviewed and is unchanged.  There is no change in the plan of care. We plan to proceed with the scheduled procedure.  [name], MD  12/13/2017, 10:32 AM

## 2017-12-13 NOTE — Op Note (Signed)
[name]    OPERATIVE NOTE   PROCEDURE: 1.  Left femoral artery to femoral vein arteriovenous graft cannulation under ultrasound guidance 2.  Left leg shuntogram 3.  Percutaneous transluminal angioplasty of the arterial access site with 7 mm diameter by 8 cm length high-pressure angioplasty balloon 4.  Percutaneous transluminal angioplasty of the venous anastomosis with 7 mm diameter by 8 cm length high-pressure angioplasty balloon  PRE-OPERATIVE DIAGNOSIS: 1. ESRD 2. Malfunctioning left thigh arteriovenous graft  POST-OPERATIVE DIAGNOSIS: same as above   SURGEON: [name], MD  ANESTHESIA: local with MCS  ESTIMATED BLOOD LOSS: 3 cc  FINDING(S): 1. Approximately 70 to 75% in-stent stenosis at the arterial access site.  The venous anastomosis had a 50 to 60% stenosis just beyond the previously placed stent.  The remainder of the graft appeared widely patent as did the venous outflow and inferior vena cava.  SPECIMEN(S):  None  CONTRAST: 20 cc  FLUORO TIME: 0.8 minutes  MODERATE CONSCIOUS SEDATION TIME:  Approximately 15 minutes using 2 mg of Versed and 50 mcg of Fentanyl  INDICATIONS: Amy Wu is a 67 y.o. female who presents with malfunctioning left thigh arteriovenous graft.  The patient is scheduled for left thigh shuntogram.  The patient is aware the risks include but are not limited to: bleeding, infection, thrombosis of the cannulated access, and possible anaphylactic reaction to the contrast.  The patient is aware of the risks of the procedure and elects to proceed forward.  DESCRIPTION: After full informed written consent was obtained, the patient was brought back to the angiography suite and placed supine upon the angiography table.  The patient was connected to monitoring equipment. Moderate conscious sedation was administered during a face to face encounter throughout the procedure with my supervision of the RN administering medicines  and monitoring the patient's vital signs, pulse oximetry, telemetry and mental status throughout from the start of the procedure until the patient was taken to the recovery room The left thigh was prepped and draped in the standard fashion for a percutaneous access intervention.  Under ultrasound guidance, the left femoral artery to femoral vein arteriovenous graft was cannulated with a micropuncture needle under direct ultrasound guidance and a permanent image was performed.  The microwire was advanced into the graft and the needle was exchanged for the a microsheath.  I then upsized to a 6 Fr Sheath and imaging was performed.  Hand injections were completed to image the access including the central venous system. This demonstrated 70 to 75% in-stent stenosis at the arterial access site.  The venous anastomosis had a 50 to 60% stenosis just beyond the previously placed stent.  The remainder of the graft appeared widely patent as did the venous outflow and inferior vena cava.  Based on the images, this patient will need intervention to these areas. I then gave the patient 3000 units of intravenous heparin.  I then crossed the stenosis with a Magic Tourqe wire.  Based on the imaging, a 7 mm x 8 cm  High pressure angioplasty balloon was selected.  The balloon was centered around the arterial access site in stent stenosis and inflated to 16 ATM for 1 minute(s).  The balloon was then advanced to the venous anastomosis and inflated to 12 atm for 1 minute.  On completion imaging, a 20-25 % residual stenosis was present at the arterial access site and only about a 15 to 20% residual stenosis at the venous anastomosis.  Based on the completion imaging, no further intervention is necessary.  The wire and balloon were removed from the sheath.  A 4-0 Monocryl purse-string suture was sewn around the sheath.  The sheath was removed while tying down the suture.  A sterile bandage was applied to the puncture  site.  COMPLICATIONS: None  CONDITION: Stable   Jason Dew  12/13/2017 11:52 AM    This note was created with Dragon Medical transcription system. Any errors in dictation are purely unintentional. 

## 2017-12-13 NOTE — H&P (Signed)
[facility] Admission History & Physical  MRN : [id number]  Amy Wu is a 67 y.o. (1950-10-01) female who presents with chief complaint of No chief complaint on file. Marland Kitchen  History of Present Illness: I am asked to evaluate the patient by the dialysis center. The patient was sent here because they were unable to achieve adequate dialysis this morning. Furthermore the Center states there is very poor thrill and bruit. The patient states there there have been increasing problems with the access, such as "pulling clots" during dialysis and prolonged bleeding after decannulation. The patient estimates these problems have been going on for several weeks. The patient is unaware of any other change.  Patient denies pain or tenderness overlying the access.  There is no pain with dialysis.  The patient denies hand pain or finger pain consistent with steal syndrome.   There have been past interventions or declots of this access, but not in the last six months.  The patient is not chronically hypotensive on dialysis.  Current Facility-Administered Medications  Medication Dose Route Frequency Provider Last Rate Last Dose  . 0.9 %  sodium chloride infusion   Intravenous Continuous Stegmayer, Kimberly A, PA-C      . clindamycin (CLEOCIN) 300 MG/50ML IVPB           . clindamycin (CLEOCIN) IVPB 300 mg  300 mg Intravenous Once Stegmayer, Kimberly A, PA-C      . diphenhydrAMINE (BENADRYL) 50 MG/ML injection           . diphenhydrAMINE (BENADRYL) injection 25 mg  25 mg Intravenous Once Algernon Huxley, MD      . famotidine (PEPCID) 20 MG tablet           . famotidine (PEPCID) tablet 40 mg  40 mg Oral PRN Stegmayer, Janalyn Harder, PA-C      . HYDROmorphone (DILAUDID) injection 1 mg  1 mg Intravenous Once PRN Stegmayer, Kimberly A, PA-C      . methylPREDNISolone sodium succinate (SOLU-MEDROL) 125 mg/2 mL injection 125 mg  125 mg Intravenous PRN Stegmayer, Kimberly A, PA-C      .  methylPREDNISolone sodium succinate (SOLU-MEDROL) 125 mg/2 mL injection           . ondansetron (ZOFRAN) injection 4 mg  4 mg Intravenous Q6H PRN Stegmayer, Janalyn Harder, PA-C        Past Medical History:  Diagnosis Date  . Arthritis   . Chronic kidney disease   . Diabetes mellitus without complication ([place])   . Hyperlipidemia   . Hypertension   . Peripheral vascular disease [facility])     Past Surgical History:  Procedure Laterality Date  . A/V SHUNTOGRAM Left 02/16/2017   Procedure: A/V Shuntogram;  Surgeon: Algernon Huxley, MD;  Location: [place] [facility];  Service: Cardiovascular;  Laterality: Left;  . AV FISTULA PLACEMENT Bilateral   . AV FISTULA PLACEMENT Left 07/01/2015   Procedure: INSERTION OF ARTERIOVENOUS (AV) GRAFT THIGH (ARTEGRAFT);  Surgeon: Algernon Huxley, MD;  Location: [facility];  Service: Vascular;  Laterality: Left;  . BELOW KNEE LEG AMPUTATION Left   . ENDARTERECTOMY FEMORAL Left 07/01/2015   Procedure: SUPERFICIAL FEMORAL ENDARTERECTOMY ;  Surgeon: Algernon Huxley, MD;  Location: [facility];  Service: Vascular;  Laterality: Left;  . EXCHANGE OF A DIALYSIS CATHETER  02/11/2015   Procedure: Exchange Of A Dialysis Catheter;  Surgeon: Algernon Huxley, MD;  Location: [place] [facility];  Service: Cardiovascular;;  . LEG AMPUTATION  ABOVE KNEE Right   . PERIPHERAL VASCULAR CATHETERIZATION N/A 02/11/2015   Procedure: Dialysis/Perma Catheter Insertion;  Surgeon: Algernon Huxley, MD;  Location: [place] [facility];  Service: Cardiovascular;  Laterality: N/A;  . PERIPHERAL VASCULAR CATHETERIZATION N/A 05/11/2015   Procedure: Dialysis/Perma Catheter Insertion, exchange;  Surgeon: Katha Cabal, MD;  Location: [place] [facility];  Service: Cardiovascular;  Laterality: N/A;  . PERIPHERAL VASCULAR CATHETERIZATION N/A 05/18/2015   Procedure: Dialysis/Perma Catheter Insertion;  Surgeon: Katha Cabal, MD;  Location: [place] [facility];  Service: Cardiovascular;  Laterality: N/A;  . PERIPHERAL  VASCULAR CATHETERIZATION Left 07/19/2015   Procedure: A/V Shuntogram/Fistulagram;  Surgeon: Algernon Huxley, MD;  Location: [place] [facility];  Service: Cardiovascular;  Laterality: Left;  . PERIPHERAL VASCULAR CATHETERIZATION N/A 07/19/2015   Procedure: A/V Shunt Intervention;  Surgeon: Algernon Huxley, MD;  Location: [place] [facility];  Service: Cardiovascular;  Laterality: N/A;  . PERIPHERAL VASCULAR CATHETERIZATION N/A 08/26/2015   Procedure: Dialysis/Perma Catheter Removal;  Surgeon: Algernon Huxley, MD;  Location: [place] [facility];  Service: Cardiovascular;  Laterality: N/A;  . PERIPHERAL VASCULAR THROMBECTOMY N/A 12/29/2016   Procedure: Peripheral Vascular Thrombectomy;  Surgeon: Katha Cabal, MD;  Location: [place] [facility];  Service: Cardiovascular;  Laterality: N/A;    Social History Social History   Tobacco Use  . Smoking status: Never Smoker  . Smokeless tobacco: Never Used  Substance Use Topics  . Alcohol use: No  . Drug use: No    Family History Family History  Problem Relation Age of Onset  . Breast cancer Neg Hx     No family history of bleeding or clotting disorders, autoimmune disease or porphyria  Allergies  Allergen Reactions  . Ancef [Cefazolin] Palpitations  . Contrast Media [Iodinated Diagnostic Agents] Palpitations     REVIEW OF SYSTEMS (Negative unless checked)  Constitutional: [] Weight loss  [] Fever  [] Chills Cardiac: [] Chest pain   [] Chest pressure   [] Palpitations   [] Shortness of breath when laying flat   [] Shortness of breath at rest   [x] Shortness of breath with exertion. Vascular:  [] Pain in legs with walking   [] Pain in legs at rest   [] Pain in legs when laying flat   [] Claudication   [] Pain in feet when walking  [] Pain in feet at rest  [] Pain in feet when laying flat   [] History of DVT   [] Phlebitis   [] Swelling in legs   [] Varicose veins   [] Non-healing ulcers Pulmonary:   [] Uses home oxygen   [] Productive cough   [] Hemoptysis   [] Wheeze   [] COPD   [] Asthma Neurologic:  [] Dizziness  [] Blackouts   [] Seizures   [] History of stroke   [] History of TIA  [] Aphasia   [] Temporary blindness   [] Dysphagia   [] Weakness or numbness in arms   [] Weakness or numbness in legs Musculoskeletal:  [x] Arthritis   [] Joint swelling   [x] Joint pain   [] Low back pain Hematologic:  [] Easy bruising  [] Easy bleeding   [] Hypercoagulable state   [] Anemic  [] Hepatitis Gastrointestinal:  [] Blood in stool   [] Vomiting blood  [] Gastroesophageal reflux/heartburn   [] Difficulty swallowing. Genitourinary:  [x] Chronic kidney disease   [] Difficult urination  [] Frequent urination  [] Burning with urination   [] Blood in urine Skin:  [] Rashes   [] Ulcers   [] Wounds Psychological:  [] History of anxiety   []  History of major depression.  Physical Examination  Vitals:   12/13/17 1012  BP: 135/64  Pulse: 74  Resp: 13  Temp: 98.5 F (36.9 C)  TempSrc: Oral  SpO2: 99%  Weight: 224 lb (101.6 kg)  Height: 5\' 3"  (1.6 m)   Body mass index is 39.68 kg/m. Gen: WD/WN, NAD Head: [place]/AT, No temporalis wasting. Ear/Nose/Throat: Hearing grossly intact, nares w/o erythema or drainage, oropharynx w/o Erythema/Exudate,  Eyes: Conjunctiva clear, sclera non-icteric Neck: Trachea midline.  No JVD.  Pulmonary:  Good air movement, respirations not labored, no use of accessory muscles.  Cardiac: RRR, normal S1, S2. Vascular: thrill present in left thigh AVG                      Musculoskeletal: M/S 5/5 throughout. Uses a wheelchair, bilateral amputee Neurologic: Sensation grossly intact in extremities.  Symmetrical.  Speech is fluent. Motor exam as listed above. Psychiatric: Judgment intact, Mood & affect appropriate for pt's clinical situation. Dermatologic: No rashes or ulcers noted.  No cellulitis or open wounds.    CBC Lab Results  Component Value Date   WBC 9.9 06/22/2015   HGB 15.3 (H) 07/01/2015   HCT 45.0 07/01/2015   MCV 78.7 (L) 06/22/2015   PLT 169  06/22/2015    BMET    Component Value Date/Time   NA 139 07/01/2015 1103   NA 138 12/03/2012 1414   K 4.9 07/01/2015 1103   K 4.5 12/03/2012 1414   CL 102 06/22/2015 1606   CL 101 12/03/2012 1414   CO2 27 06/22/2015 1606   CO2 27 12/03/2012 1414   GLUCOSE 124 (H) 07/01/2015 1103   GLUCOSE 326 (H) 12/03/2012 1414   BUN 38 (H) 06/22/2015 1606   BUN 37 (H) 12/03/2012 1414   CREATININE 7.47 (H) 06/22/2015 1606   CREATININE 7.04 (H) 12/03/2012 1414   CALCIUM 9.5 06/22/2015 1606   CALCIUM 8.4 (L) 12/03/2012 1414   GFRNONAA 5 (L) 06/22/2015 1606   GFRNONAA 6 (L) 12/03/2012 1414   GFRAA 6 (L) 06/22/2015 1606   GFRAA 7 (L) 12/03/2012 1414   CrCl cannot be calculated (Patient's most recent lab result is older than the maximum 21 days allowed.).  COAG Lab Results  Component Value Date   INR 1.11 06/22/2015   INR 1.0 07/26/2011    Radiology No results found.  Assessment/Plan 1.  Complication dialysis device with dysfunction of AV access:  Patient's left thigh dialysis access is malfunctioning. The patient will undergo angiography and correction of any problems using interventional techniques with the hope of restoring function to the access.  The risks and benefits were described to the patient.  All questions were answered.  The patient agrees to proceed with angiography and intervention. Potassium will be drawn to ensure that it is an appropriate level prior to performing intervention. 2.  End-stage renal disease requiring hemodialysis:  Patient will continue dialysis therapy without further interruption if a successful intervention is not achieved then a tunneled catheter will be placed. Dialysis has already been arranged. 3.  Hypertension:  Patient will continue medical management; nephrology is following no changes in oral medications. 4. Diabetes mellitus:  Glucose will be monitored and oral medications been held this morning once the patient has undergone the patient's procedure  po intake will be reinitiated and again Accu-Cheks will be used to assess the blood glucose level and treat as needed. The patient will be restarted on the patient's usual hypoglycemic regime     [name], MD  12/13/2017 10:37 AM

## 2018-04-02 ENCOUNTER — Ambulatory Visit (INDEPENDENT_AMBULATORY_CARE_PROVIDER_SITE_OTHER): Payer: Medicare (Managed Care) | Admitting: Vascular Surgery

## 2018-04-02 ENCOUNTER — Encounter (INDEPENDENT_AMBULATORY_CARE_PROVIDER_SITE_OTHER): Payer: Medicare (Managed Care)

## 2018-04-02 ENCOUNTER — Encounter

## 2018-05-13 ENCOUNTER — Ambulatory Visit (INDEPENDENT_AMBULATORY_CARE_PROVIDER_SITE_OTHER): Payer: Medicare (Managed Care) | Admitting: Nurse Practitioner

## 2018-05-13 ENCOUNTER — Ambulatory Visit (INDEPENDENT_AMBULATORY_CARE_PROVIDER_SITE_OTHER): Payer: Medicare (Managed Care)

## 2018-05-13 ENCOUNTER — Encounter (INDEPENDENT_AMBULATORY_CARE_PROVIDER_SITE_OTHER): Payer: Self-pay | Admitting: Nurse Practitioner

## 2018-05-13 VITALS — BP 85/45 | HR 71 | Resp 18 | Ht 63.0 in | Wt 226.0 lb

## 2018-05-13 DIAGNOSIS — N186 End stage renal disease: Secondary | ICD-10-CM | POA: Diagnosis not present

## 2018-05-13 DIAGNOSIS — Z992 Dependence on renal dialysis: Secondary | ICD-10-CM

## 2018-05-13 DIAGNOSIS — E1122 Type 2 diabetes mellitus with diabetic chronic kidney disease: Secondary | ICD-10-CM | POA: Diagnosis not present

## 2018-05-13 DIAGNOSIS — E785 Hyperlipidemia, unspecified: Secondary | ICD-10-CM

## 2018-05-13 DIAGNOSIS — Z794 Long term (current) use of insulin: Secondary | ICD-10-CM

## 2018-05-13 NOTE — Progress Notes (Signed)
Subjective:    Patient ID: Amy Wu, female    DOB: [birth year], 67 y.o.   MRN: [id number] Chief Complaint  Patient presents with  . Follow-up    6 month HDA f/u    HPI  Amy Wu is a 67 y.o. female that returns to the office for followup of their dialysis access. The function of the access has been stable. The patient denies increased bleeding time or increased recirculation. Patient denies difficulty with cannulation. The patient denies hand pain or other symptoms consistent with steal phenomena.  No significant arm swelling.  She reports no issues with emesis.  The patient denies redness or swelling at the access site. The patient denies fever or chills at home or while on dialysis.  The patient denies amaurosis fugax or recent TIA symptoms. There are no recent neurological changes noted. The patient denies claudication symptoms or rest pain symptoms. The patient denies history of DVT, PE or superficial thrombophlebitis. The patient denies recent episodes of angina or shortness of breath.   She underwent a dialysis access duplex today.  She had a flow volume of 1748.  A patent left thigh AV graft with no significant focal stenosis visualized.  There was some limitation with the exam because the patient was in a wheelchair.     Past Medical History:  Diagnosis Date  . Arthritis   . Chronic kidney disease   . Diabetes mellitus without complication ([place])   . Hyperlipidemia   . Hypertension   . Peripheral vascular disease [facility])     Past Surgical History:  Procedure Laterality Date  . A/V SHUNTOGRAM Left 02/16/2017   Procedure: A/V Shuntogram;  Surgeon: Algernon Huxley, MD;  Location: [place] [facility];  Service: Cardiovascular;  Laterality: Left;  . A/V SHUNTOGRAM Left 12/13/2017   Procedure: A/V SHUNTOGRAM;  Surgeon: Algernon Huxley, MD;  Location: [place] [facility];  Service: Cardiovascular;  Laterality: Left;  . ABOVE KNEE LEG AMPUTATION    . AV FISTULA  PLACEMENT Bilateral   . AV FISTULA PLACEMENT Left 07/01/2015   Procedure: INSERTION OF ARTERIOVENOUS (AV) GRAFT THIGH (ARTEGRAFT);  Surgeon: Algernon Huxley, MD;  Location: [facility];  Service: Vascular;  Laterality: Left;  . BELOW KNEE LEG AMPUTATION Left   . ENDARTERECTOMY FEMORAL Left 07/01/2015   Procedure: SUPERFICIAL FEMORAL ENDARTERECTOMY ;  Surgeon: Algernon Huxley, MD;  Location: [facility];  Service: Vascular;  Laterality: Left;  . EXCHANGE OF A DIALYSIS CATHETER  02/11/2015   Procedure: Exchange Of A Dialysis Catheter;  Surgeon: Algernon Huxley, MD;  Location: [place] [facility];  Service: Cardiovascular;;  . LEG AMPUTATION ABOVE KNEE Right   . PERIPHERAL VASCULAR CATHETERIZATION N/A 02/11/2015   Procedure: Dialysis/Perma Catheter Insertion;  Surgeon: Algernon Huxley, MD;  Location: [place] [facility];  Service: Cardiovascular;  Laterality: N/A;  . PERIPHERAL VASCULAR CATHETERIZATION N/A 05/11/2015   Procedure: Dialysis/Perma Catheter Insertion, exchange;  Surgeon: Katha Cabal, MD;  Location: [place] [facility];  Service: Cardiovascular;  Laterality: N/A;  . PERIPHERAL VASCULAR CATHETERIZATION N/A 05/18/2015   Procedure: Dialysis/Perma Catheter Insertion;  Surgeon: Katha Cabal, MD;  Location: [place] [facility];  Service: Cardiovascular;  Laterality: N/A;  . PERIPHERAL VASCULAR CATHETERIZATION Left 07/19/2015   Procedure: A/V Shuntogram/Fistulagram;  Surgeon: Algernon Huxley, MD;  Location: [place] [facility];  Service: Cardiovascular;  Laterality: Left;  . PERIPHERAL VASCULAR CATHETERIZATION N/A 07/19/2015   Procedure: A/V Shunt Intervention;  Surgeon: Corene Cornea  Bunnie Domino, MD;  Location: [place] [facility];  Service: Cardiovascular;  Laterality: N/A;  . PERIPHERAL VASCULAR CATHETERIZATION N/A 08/26/2015   Procedure: Dialysis/Perma Catheter Removal;  Surgeon: Algernon Huxley, MD;  Location: [place] [facility];  Service: Cardiovascular;  Laterality: N/A;  . PERIPHERAL VASCULAR THROMBECTOMY N/A  12/29/2016   Procedure: Peripheral Vascular Thrombectomy;  Surgeon: Katha Cabal, MD;  Location: [place] [facility];  Service: Cardiovascular;  Laterality: N/A;    Social History   Socioeconomic History  . Marital status: Single    Spouse name: Not on file  . Number of children: Not on file  . Years of education: Not on file  . Highest education level: Not on file  Occupational History  . Not on file  Social Needs  . Financial resource strain: Not on file  . Food insecurity:    Worry: Not on file    Inability: Not on file  . Transportation needs:    Medical: Not on file    Non-medical: Not on file  Tobacco Use  . Smoking status: Never Smoker  . Smokeless tobacco: Never Used  Substance and Sexual Activity  . Alcohol use: No  . Drug use: No  . Sexual activity: Not on file  Lifestyle  . Physical activity:    Days per week: Not on file    Minutes per session: Not on file  . Stress: Not on file  Relationships  . Social connections:    Talks on phone: Not on file    Gets together: Not on file    Attends religious service: Not on file    Active member of club or organization: Not on file    Attends meetings of clubs or organizations: Not on file    Relationship status: Not on file  . Intimate partner violence:    Fear of current or ex partner: Not on file    Emotionally abused: Not on file    Physically abused: Not on file    Forced sexual activity: Not on file  Other Topics Concern  . Not on file  Social History Narrative  . Not on file    Family History  Problem Relation Age of Onset  . Breast cancer Neg Hx     Allergies  Allergen Reactions  . Ancef [Cefazolin] Palpitations  . Contrast Media [Iodinated Diagnostic Agents] Palpitations     Review of Systems   Review of Systems: Negative Unless Checked Constitutional: [] Weight loss  [] Fever  [] Chills Cardiac: [] Chest pain   []  Atrial Fibrillation  [] Palpitations   [] Shortness of breath when laying  flat   [] Shortness of breath with exertion. Vascular:  [] Pain in legs with walking   [] Pain in legs with standing  [] History of DVT   [] Phlebitis   [] Swelling in legs   [] Varicose veins   [] Non-healing ulcers Pulmonary:   [] Uses home oxygen   [] Productive cough   [] Hemoptysis   [] Wheeze  [] COPD   [] Asthma Neurologic:  [] Dizziness   [] Seizures   [] History of stroke   [] History of TIA  [] Aphasia   [] Vissual changes   [] Weakness or numbness in arm   [] Weakness or numbness in leg Musculoskeletal:   [] Joint swelling   [] Joint pain   [] Low back pain  []  History of Knee Replacement Hematologic:  [] Easy bruising  [] Easy bleeding   [] Hypercoagulable state   [] Anemic Gastrointestinal:  [] Diarrhea   [] Vomiting  [] Gastroesophageal reflux/heartburn   [] Difficulty swallowing. Genitourinary:  [x] Chronic kidney disease   []   Difficult urination  [] Anuric   [] Blood in urine Skin:  [] Rashes   [] Ulcers  Psychological:  [] History of anxiety   []  History of major depression  []  Memory Difficulties     Objective:   Physical Exam  BP (!) 85/45 (BP Location: Left Arm, Patient Position: Sitting)   Pulse 71   Resp 18   Ht 5\' 3"  (1.6 m)   Wt 226 lb (102.5 kg)   BMI 40.03 kg/m   Gen: WD/WN, NAD Head: [place]/AT, No temporalis wasting.  Ear/Nose/Throat: Hearing grossly intact, nares w/o erythema or drainage Eyes: PER, EOMI, sclera nonicteric.  Neck: Supple, no masses.  No JVD.  Pulmonary:  Good air movement, no use of accessory muscles.  Cardiac: RRR Vascular:  Left thigh AV graft good thrill and bruit Vessel Right Left  Radial Palpable Palpable   Gastrointestinal: soft, non-distended. No guarding/no peritoneal signs.  Musculoskeletal: Bilateral lower extremity amputation Neurologic: Pain and light touch intact in extremities.  Symmetrical.  Speech is fluent. Motor exam as listed above. Psychiatric: Judgment intact, Mood & affect appropriate for pt's clinical situation. Dermatologic: No Venous rashes. No Ulcers  Noted.  No changes consistent with cellulitis. Lymph : No Cervical lymphadenopathy, no lichenification or skin changes of chronic lymphedema.      Assessment & Plan:   1. ESRD on dialysis [place]) She underwent a dialysis access duplex today.  She had a flow volume of 1748.  A patent left thigh AV graft with no significant focal stenosis visualized.  There was some limitation with the exam because the patient was in a wheelchair.  Recommend:  The patient is doing well and currently has adequate dialysis access. The patient's dialysis center is not reporting any access issues. Flow pattern is stable when compared to the prior ultrasound.  The patient should have a duplex ultrasound of the dialysis access in 6 months. The patient will follow-up with me in the office after each ultrasound    - VAS [place] (AVF, AVG); Future  2. Type 2 diabetes mellitus with chronic kidney disease on chronic dialysis, with long-term current use of insulin (HCC) Continue hypoglycemic medications as already ordered, these medications have been reviewed and there are no changes at this time.  Hgb A1C to be monitored as already arranged by primary service   3. Hyperlipidemia, unspecified hyperlipidemia type Continue statin as ordered and reviewed, no changes at this time    Current Outpatient Medications on File Prior to Visit  Medication Sig Dispense Refill  . aspirin 81 MG tablet Take 81 mg by mouth daily.    Marland Kitchen atorvastatin (LIPITOR) 40 MG tablet Take 40 mg by mouth daily.    . camphor-menthol (SARNA) lotion Apply 1 application topically as needed for itching. Twice a day if needed    . cinacalcet (SENSIPAR) 30 MG tablet Take 30 mg by mouth daily with supper.     . ferrous sulfate 325 (65 FE) MG tablet Take 325 mg by mouth every other day.    . insulin aspart (NOVOLOG) 100 UNIT/ML injection Inject 1 Units into the skin See admin instructions. Pt uses insulin PRN for Blood Sugar  greater then 200 uses 1 unit if greater then 200     . lanthanum (FOSRENOL) 1000 MG chewable tablet Chew 1,000 mg by mouth 2 (two) times daily with a meal.     . loratadine (CLARITIN) 10 MG tablet Take 10 mg by mouth daily as needed for allergies.    Vladimir Faster  Glycol-Propyl Glycol (SYSTANE) 0.4-0.3 % SOLN Place 1 drop into both eyes 4 (four) times daily as needed.     . polyethylene glycol (MIRALAX / GLYCOLAX) packet Take 17 g by mouth daily as needed for moderate constipation or severe constipation.    Marland Kitchen rOPINIRole (REQUIP) 0.25 MG tablet Take 0.25 mg by mouth at bedtime as needed.    . senna-docusate (SENOKOT-S) 8.6-50 MG tablet Take 1-2 tablets by mouth at bedtime as needed for mild constipation or moderate constipation. 1-2 tablets at bedtime if needed     . Skin Protectants, Misc. (EUCERIN) cream Apply 1 application topically 4 (four) times daily as needed for dry skin.     Marland Kitchen acetaminophen (TYLENOL) 500 MG tablet Take 1,000 mg by mouth every 8 (eight) hours as needed for moderate pain.    . Cholecalciferol (VITAMIN D3) 2000 units TABS Take 2,000 Units by mouth daily.    Marland Kitchen Dextromethorphan-guaiFENesin (MUCINEX DM) 30-600 MG TB12 Take by mouth.    Marland Kitchen glucose 4 GM chewable tablet Chew 1 tablet by mouth as needed for low blood sugar. If blood sugar less than 70. Chew 3 tablets and recheck blood sugar in 15 minutes    . liver oil-zinc oxide (DESITIN) 40 % ointment Apply 1 application topically as needed for irritation.    Marland Kitchen loperamide (IMODIUM A-D) 2 MG tablet Take 2 mg by mouth 4 (four) times daily as needed for diarrhea or loose stools. 2 tablets by mouth for onset of loose stool, than 1 tablet by mouth after each loose stool MAX 4 tablets in 24 hours    . nystatin (MYCOSTATIN) powder Apply 100,000 g topically 2 (two) times daily as needed. 100.000unit/gm      No current facility-administered medications on file prior to visit.     There are no Patient Instructions on file for this visit. No  follow-ups on file.   Kris Hartmann, NP  This note was completed with Sales executive.  Any errors are purely unintentional.

## 2018-05-14 ENCOUNTER — Encounter (INDEPENDENT_AMBULATORY_CARE_PROVIDER_SITE_OTHER): Payer: Medicare (Managed Care)

## 2018-05-14 ENCOUNTER — Ambulatory Visit (INDEPENDENT_AMBULATORY_CARE_PROVIDER_SITE_OTHER): Payer: Medicare (Managed Care) | Admitting: Vascular Surgery

## 2018-05-30 ENCOUNTER — Ambulatory Visit (INDEPENDENT_AMBULATORY_CARE_PROVIDER_SITE_OTHER): Payer: Medicare (Managed Care) | Admitting: Nurse Practitioner

## 2018-05-30 ENCOUNTER — Encounter (INDEPENDENT_AMBULATORY_CARE_PROVIDER_SITE_OTHER): Payer: Medicare (Managed Care)

## 2018-08-22 ENCOUNTER — Ambulatory Visit (INDEPENDENT_AMBULATORY_CARE_PROVIDER_SITE_OTHER): Payer: Medicare (Managed Care)

## 2018-08-22 ENCOUNTER — Encounter (INDEPENDENT_AMBULATORY_CARE_PROVIDER_SITE_OTHER): Payer: Self-pay | Admitting: Nurse Practitioner

## 2018-08-22 ENCOUNTER — Ambulatory Visit (INDEPENDENT_AMBULATORY_CARE_PROVIDER_SITE_OTHER): Payer: Medicare (Managed Care) | Admitting: Nurse Practitioner

## 2018-08-22 VITALS — BP 95/78 | HR 78 | Resp 16 | Ht 63.0 in | Wt 229.0 lb

## 2018-08-22 DIAGNOSIS — Z992 Dependence on renal dialysis: Secondary | ICD-10-CM

## 2018-08-22 DIAGNOSIS — Z794 Long term (current) use of insulin: Secondary | ICD-10-CM

## 2018-08-22 DIAGNOSIS — E785 Hyperlipidemia, unspecified: Secondary | ICD-10-CM | POA: Diagnosis not present

## 2018-08-22 DIAGNOSIS — N186 End stage renal disease: Secondary | ICD-10-CM

## 2018-08-22 DIAGNOSIS — E1122 Type 2 diabetes mellitus with diabetic chronic kidney disease: Secondary | ICD-10-CM | POA: Diagnosis not present

## 2018-08-22 DIAGNOSIS — Z79899 Other long term (current) drug therapy: Secondary | ICD-10-CM

## 2018-08-22 NOTE — Progress Notes (Signed)
SUBJECTIVE:  Patient ID: Amy Wu, female    DOB: [birth year], 68 y.o.   MRN: [id number] Chief Complaint  Patient presents with  . Follow-up    ref [facility] for HDA    HPI  Amy Wu is a 68 y.o. female presents today with concerns about her left upper thigh AV graft from her dialysis center.  She states that about a week or so ago they were having issues with accessing and that they pulled "clots" from her graft.  She stated that after they did this her graft ran fine.  And she has not had any troubles since then.  She denies any fever, chills, nausea, vomiting or diarrhea.  She denies any issues with hold times.  She denies any TIA-like symptoms or amaurosis fugax.  She denies any chest pain or shortness of breath.  Today she underwent a hemodialysis access duplex and she has full volumes of 1976.  Elevated velocities near the venous anastomosis site.  Previous studies done on 05/30/2018 had flow volumes at 1748.  Overall site appears to have no areas of hemodynamically significant stenosis.  Past Medical History:  Diagnosis Date  . Arthritis   . Chronic kidney disease   . Diabetes mellitus without complication ([place])   . Hyperlipidemia   . Hypertension   . Peripheral vascular disease [facility])     Past Surgical History:  Procedure Laterality Date  . A/V SHUNTOGRAM Left 02/16/2017   Procedure: A/V Shuntogram;  Surgeon: Algernon Huxley, MD;  Location: [place] [facility];  Service: Cardiovascular;  Laterality: Left;  . A/V SHUNTOGRAM Left 12/13/2017   Procedure: A/V SHUNTOGRAM;  Surgeon: Algernon Huxley, MD;  Location: [place] [facility];  Service: Cardiovascular;  Laterality: Left;  . ABOVE KNEE LEG AMPUTATION    . AV FISTULA PLACEMENT Bilateral   . AV FISTULA PLACEMENT Left 07/01/2015   Procedure: INSERTION OF ARTERIOVENOUS (AV) GRAFT THIGH (ARTEGRAFT);  Surgeon: Algernon Huxley, MD;  Location: [facility];  Service: Vascular;  Laterality: Left;  . BELOW KNEE LEG AMPUTATION Left   .  ENDARTERECTOMY FEMORAL Left 07/01/2015   Procedure: SUPERFICIAL FEMORAL ENDARTERECTOMY ;  Surgeon: Algernon Huxley, MD;  Location: [facility];  Service: Vascular;  Laterality: Left;  . EXCHANGE OF A DIALYSIS CATHETER  02/11/2015   Procedure: Exchange Of A Dialysis Catheter;  Surgeon: Algernon Huxley, MD;  Location: [place] [facility];  Service: Cardiovascular;;  . LEG AMPUTATION ABOVE KNEE Right   . PERIPHERAL VASCULAR CATHETERIZATION N/A 02/11/2015   Procedure: Dialysis/Perma Catheter Insertion;  Surgeon: Algernon Huxley, MD;  Location: [place] [facility];  Service: Cardiovascular;  Laterality: N/A;  . PERIPHERAL VASCULAR CATHETERIZATION N/A 05/11/2015   Procedure: Dialysis/Perma Catheter Insertion, exchange;  Surgeon: Katha Cabal, MD;  Location: [place] [facility];  Service: Cardiovascular;  Laterality: N/A;  . PERIPHERAL VASCULAR CATHETERIZATION N/A 05/18/2015   Procedure: Dialysis/Perma Catheter Insertion;  Surgeon: Katha Cabal, MD;  Location: [place] [facility];  Service: Cardiovascular;  Laterality: N/A;  . PERIPHERAL VASCULAR CATHETERIZATION Left 07/19/2015   Procedure: A/V Shuntogram/Fistulagram;  Surgeon: Algernon Huxley, MD;  Location: [place] [facility];  Service: Cardiovascular;  Laterality: Left;  . PERIPHERAL VASCULAR CATHETERIZATION N/A 07/19/2015   Procedure: A/V Shunt Intervention;  Surgeon: Algernon Huxley, MD;  Location: [place] [facility];  Service: Cardiovascular;  Laterality: N/A;  . PERIPHERAL VASCULAR CATHETERIZATION N/A 08/26/2015   Procedure: Dialysis/Perma Catheter Removal;  Surgeon: Algernon Huxley,  MD;  Location: [place] [facility];  Service: Cardiovascular;  Laterality: N/A;  . PERIPHERAL VASCULAR THROMBECTOMY N/A 12/29/2016   Procedure: Peripheral Vascular Thrombectomy;  Surgeon: Katha Cabal, MD;  Location: [place] [facility];  Service: Cardiovascular;  Laterality: N/A;    Social History   Socioeconomic History  . Marital status: Single    Spouse name: Not on  file  . Number of children: Not on file  . Years of education: Not on file  . Highest education level: Not on file  Occupational History  . Not on file  Social Needs  . Financial resource strain: Not on file  . Food insecurity:    Worry: Not on file    Inability: Not on file  . Transportation needs:    Medical: Not on file    Non-medical: Not on file  Tobacco Use  . Smoking status: Never Smoker  . Smokeless tobacco: Never Used  Substance and Sexual Activity  . Alcohol use: No  . Drug use: No  . Sexual activity: Not on file  Lifestyle  . Physical activity:    Days per week: Not on file    Minutes per session: Not on file  . Stress: Not on file  Relationships  . Social connections:    Talks on phone: Not on file    Gets together: Not on file    Attends religious service: Not on file    Active member of club or organization: Not on file    Attends meetings of clubs or organizations: Not on file    Relationship status: Not on file  . Intimate partner violence:    Fear of current or ex partner: Not on file    Emotionally abused: Not on file    Physically abused: Not on file    Forced sexual activity: Not on file  Other Topics Concern  . Not on file  Social History Narrative  . Not on file    Family History  Problem Relation Age of Onset  . Breast cancer Neg Hx     Allergies  Allergen Reactions  . Ancef [Cefazolin] Palpitations  . Contrast Media [Iodinated Diagnostic Agents] Palpitations     Review of Systems   Review of Systems: Negative Unless Checked Constitutional: [] Weight loss  [] Fever  [] Chills Cardiac: [] Chest pain   []  Atrial Fibrillation  [] Palpitations   [] Shortness of breath when laying flat   [] Shortness of breath with exertion. [] Shortness of breath at rest Vascular:  [] Pain in legs with walking   [] Pain in legs with standing [] Pain in legs when laying flat   [] Claudication    [] Pain in feet when laying flat    [] History of DVT   [] Phlebitis    [] Swelling in legs   [] Varicose veins   [] Non-healing ulcers Pulmonary:   [] Uses home oxygen   [] Productive cough   [] Hemoptysis   [] Wheeze  [] COPD   [] Asthma Neurologic:  [] Dizziness   [] Seizures  [] Blackouts [] History of stroke   [] History of TIA  [] Aphasia   [] Temporary Blindness   [] Weakness or numbness in arm   [] Weakness or numbness in leg Musculoskeletal:   [] Joint swelling   [] Joint pain   [] Low back pain  []  History of Knee Replacement [] Arthritis [] back Surgeries  []  Spinal Stenosis    Hematologic:  [] Easy bruising  [] Easy bleeding   [] Hypercoagulable state   [x] Anemic Gastrointestinal:  [] Diarrhea   [] Vomiting  [] Gastroesophageal reflux/heartburn   [] Difficulty swallowing. [] Abdominal pain Genitourinary:  [  x]Chronic kidney disease   [] Difficult urination  [] Anuric   [] Blood in urine [] Frequent urination  [] Burning with urination   [] Hematuria Skin:  [] Rashes   [] Ulcers [] Wounds Psychological:  [] History of anxiety   []  History of major depression  []  Memory Difficulties      OBJECTIVE:   Physical Exam  BP 95/78 (BP Location: Left Arm)   Pulse 78   Resp 16   Ht 5\' 3"  (1.6 m)   Wt 229 lb (103.9 kg)   BMI 40.57 kg/m   Gen: WD/WN, NAD Head: [place]/AT, No temporalis wasting.  Ear/Nose/Throat: Hearing grossly intact, nares w/o erythema or drainage Eyes: PER, EOMI, sclera nonicteric.  Neck: Supple, no masses.  No JVD.  Pulmonary:  Good air movement, no use of accessory muscles.  Cardiac: RRR Vascular:  Good thrill and bruit on left upper thigh graft Vessel Right Left  Radial Palpable Palpable  Gastrointestinal: soft, non-distended. No guarding/no peritoneal signs.  Musculoskeletal: Wheelchair-bound. bilateral amputations Neurologic: Pain and light touch intact in extremities.  Symmetrical.  Speech is fluent. Motor exam as listed above. Psychiatric: Judgment intact, Mood & affect appropriate for pt's clinical situation. Dermatologic: No Venous rashes. No Ulcers Noted.  No changes  consistent with cellulitis. Lymph : No Cervical lymphadenopathy, no lichenification or skin changes of chronic lymphedema.       ASSESSMENT AND PLAN:  1. ESRD on dialysis [facility]) Recommend:  The patient is doing well and currently has adequate dialysis access. The patient's dialysis center is not reporting any access issues. Flow pattern is stable when compared to the prior ultrasound.  The patient should have a duplex ultrasound of the dialysis access in 6 months. The patient will follow-up with me in the office after each ultrasound      2. Type 2 diabetes mellitus with chronic kidney disease on chronic dialysis, with long-term current use of insulin (HCC) Continue hypoglycemic medications as already ordered, these medications have been reviewed and there are no changes at this time.  Hgb A1C to be monitored as already arranged by primary service   3. Hyperlipidemia, unspecified hyperlipidemia type Continue statin as ordered and reviewed, no changes at this time    Current Outpatient Medications on File Prior to Visit  Medication Sig Dispense Refill  . acetaminophen (TYLENOL) 500 MG tablet Take 1,000 mg by mouth every 8 (eight) hours as needed for moderate pain.    Marland Kitchen aspirin 81 MG tablet Take 81 mg by mouth daily.    Marland Kitchen atorvastatin (LIPITOR) 40 MG tablet Take 40 mg by mouth daily.    . calcitRIOL (ROCALTROL) 0.25 MCG capsule Take 0.25 mcg by mouth daily. Take 3 capsules by mouth three times week at dialysis    . camphor-menthol (SARNA) lotion Apply 1 application topically as needed for itching. Twice a day if needed    . Cholecalciferol (VITAMIN D3) 2000 units TABS Take 2,000 Units by mouth daily.    . cinacalcet (SENSIPAR) 30 MG tablet Take 30 mg by mouth daily with supper.     . ferrous sulfate 325 (65 FE) MG tablet Take 325 mg by mouth every other day.    . fluticasone (FLONASE) 50 MCG/ACT nasal spray Place into both nostrils daily.    Marland Kitchen glucose 4 GM chewable tablet Chew  1 tablet by mouth as needed for low blood sugar. If blood sugar less than 70. Chew 3 tablets and recheck blood sugar in 15 minutes    . insulin aspart (NOVOLOG) 100 UNIT/ML injection Inject  1 Units into the skin See admin instructions. Pt uses insulin PRN for Blood Sugar greater then 200 uses 1 unit if greater then 200     . lanthanum (FOSRENOL) 1000 MG chewable tablet Chew 1,000 mg by mouth 2 (two) times daily with a meal.     . liver oil-zinc oxide (DESITIN) 40 % ointment Apply 1 application topically as needed for irritation.    Marland Kitchen loperamide (IMODIUM A-D) 2 MG tablet Take 2 mg by mouth 4 (four) times daily as needed for diarrhea or loose stools. 2 tablets by mouth for onset of loose stool, than 1 tablet by mouth after each loose stool MAX 4 tablets in 24 hours    . loratadine (CLARITIN) 10 MG tablet Take 10 mg by mouth daily as needed for allergies.    Marland Kitchen nystatin (MYCOSTATIN) powder Apply 100,000 g topically 2 (two) times daily as needed. 100.000unit/gm     . Polyethyl Glycol-Propyl Glycol (SYSTANE) 0.4-0.3 % SOLN Place 1 drop into both eyes 4 (four) times daily as needed.     . polyethylene glycol (MIRALAX / GLYCOLAX) packet Take 17 g by mouth daily as needed for moderate constipation or severe constipation.    Marland Kitchen rOPINIRole (REQUIP) 0.25 MG tablet Take 0.25 mg by mouth at bedtime as needed.    . senna-docusate (SENOKOT-S) 8.6-50 MG tablet Take 1-2 tablets by mouth at bedtime as needed for mild constipation or moderate constipation. 1-2 tablets at bedtime if needed     . Skin Protectants, Misc. (EUCERIN) cream Apply 1 application topically 4 (four) times daily as needed for dry skin.     Marland Kitchen Dextromethorphan-guaiFENesin (MUCINEX DM) 30-600 MG TB12 Take by mouth.     No current facility-administered medications on file prior to visit.     There are no Patient Instructions on file for this visit. No follow-ups on file.   Kris Hartmann, NP  This note was completed with Sales executive.  Any  errors are purely unintentional.

## 2018-11-12 ENCOUNTER — Encounter (INDEPENDENT_AMBULATORY_CARE_PROVIDER_SITE_OTHER): Payer: Medicare (Managed Care)

## 2018-11-12 ENCOUNTER — Ambulatory Visit (INDEPENDENT_AMBULATORY_CARE_PROVIDER_SITE_OTHER): Payer: Medicare (Managed Care) | Admitting: Nurse Practitioner

## 2018-11-26 ENCOUNTER — Other Ambulatory Visit (INDEPENDENT_AMBULATORY_CARE_PROVIDER_SITE_OTHER): Payer: Self-pay | Admitting: Nurse Practitioner

## 2018-11-26 DIAGNOSIS — T829XXD Unspecified complication of cardiac and vascular prosthetic device, implant and graft, subsequent encounter: Secondary | ICD-10-CM

## 2018-11-28 ENCOUNTER — Encounter (INDEPENDENT_AMBULATORY_CARE_PROVIDER_SITE_OTHER): Payer: Self-pay | Admitting: Nurse Practitioner

## 2018-11-28 ENCOUNTER — Encounter (INDEPENDENT_AMBULATORY_CARE_PROVIDER_SITE_OTHER): Payer: Self-pay

## 2018-11-28 ENCOUNTER — Ambulatory Visit (INDEPENDENT_AMBULATORY_CARE_PROVIDER_SITE_OTHER): Payer: Medicare (Managed Care) | Admitting: Nurse Practitioner

## 2018-11-28 ENCOUNTER — Ambulatory Visit (INDEPENDENT_AMBULATORY_CARE_PROVIDER_SITE_OTHER): Payer: Medicare (Managed Care)

## 2018-11-28 ENCOUNTER — Other Ambulatory Visit: Payer: Self-pay

## 2018-11-28 VITALS — BP 97/65 | HR 70 | Resp 16 | Wt 220.0 lb

## 2018-11-28 DIAGNOSIS — E1122 Type 2 diabetes mellitus with diabetic chronic kidney disease: Secondary | ICD-10-CM

## 2018-11-28 DIAGNOSIS — E785 Hyperlipidemia, unspecified: Secondary | ICD-10-CM

## 2018-11-28 DIAGNOSIS — Z794 Long term (current) use of insulin: Secondary | ICD-10-CM

## 2018-11-28 DIAGNOSIS — Z992 Dependence on renal dialysis: Secondary | ICD-10-CM

## 2018-11-28 DIAGNOSIS — T829XXD Unspecified complication of cardiac and vascular prosthetic device, implant and graft, subsequent encounter: Secondary | ICD-10-CM

## 2018-11-28 DIAGNOSIS — Z79899 Other long term (current) drug therapy: Secondary | ICD-10-CM

## 2018-11-28 DIAGNOSIS — N186 End stage renal disease: Secondary | ICD-10-CM | POA: Diagnosis not present

## 2018-11-28 DIAGNOSIS — T82898A Other specified complication of vascular prosthetic devices, implants and grafts, initial encounter: Secondary | ICD-10-CM | POA: Diagnosis not present

## 2018-11-28 NOTE — Progress Notes (Signed)
SUBJECTIVE:  Patient ID: Amy Wu, female    DOB: [birth year], 68 y.o.   MRN: [id number] Chief Complaint  Patient presents with  . Follow-up    ref [facility] for HDA low flow    HPI  Amy Wu is a 68 y.o. female The patient returns to the office for follow up regarding problem with the dialysis access. Currently the patient is maintained via a left thigh graft.  The patient has had multiple failed upper extremity accesses.  The patient notes a significant increase in bleeding time after decannulation.  The patient has also been informed that there is increased recirculation.    The patient denies hand pain or other symptoms consistent with steal phenomena.  No significant arm swelling.  The patient denies redness or swelling at the access site. The patient denies fever or chills at home or while on dialysis.  The patient denies amaurosis fugax or recent TIA symptoms. There are no recent neurological changes noted. The patient denies claudication symptoms or rest pain symptoms. The patient denies history of DVT, PE or superficial thrombophlebitis. The patient denies recent episodes of angina or shortness of breath.   The patient has a flow volume of 1573 within her graft.  There are hemodynamically significant velocities at the mid segment of her graft indicating possible stricture.   Past Medical History:  Diagnosis Date  . Arthritis   . Chronic kidney disease   . Diabetes mellitus without complication ([place])   . Hyperlipidemia   . Hypertension   . Peripheral vascular disease [facility] [place])     Past Surgical History:  Procedure Laterality Date  . A/V SHUNTOGRAM Left 02/16/2017   Procedure: A/V Shuntogram;  Surgeon: Algernon Huxley, MD;  Location: [place] [facility];  Service: Cardiovascular;  Laterality: Left;  . A/V SHUNTOGRAM Left 12/13/2017   Procedure: A/V SHUNTOGRAM;  Surgeon: Algernon Huxley, MD;  Location: [place] [facility];  Service: Cardiovascular;  Laterality: Left;  .  ABOVE KNEE LEG AMPUTATION    . AV FISTULA PLACEMENT Bilateral   . AV FISTULA PLACEMENT Left 07/01/2015   Procedure: INSERTION OF ARTERIOVENOUS (AV) GRAFT THIGH (ARTEGRAFT);  Surgeon: Algernon Huxley, MD;  Location: [facility];  Service: Vascular;  Laterality: Left;  . BELOW KNEE LEG AMPUTATION Left   . ENDARTERECTOMY FEMORAL Left 07/01/2015   Procedure: SUPERFICIAL FEMORAL ENDARTERECTOMY ;  Surgeon: Algernon Huxley, MD;  Location: [facility];  Service: Vascular;  Laterality: Left;  . EXCHANGE OF A DIALYSIS CATHETER  02/11/2015   Procedure: Exchange Of A Dialysis Catheter;  Surgeon: Algernon Huxley, MD;  Location: [place] [facility];  Service: Cardiovascular;;  . LEG AMPUTATION ABOVE KNEE Right   . PERIPHERAL VASCULAR CATHETERIZATION N/A 02/11/2015   Procedure: Dialysis/Perma Catheter Insertion;  Surgeon: Algernon Huxley, MD;  Location: [place] [facility];  Service: Cardiovascular;  Laterality: N/A;  . PERIPHERAL VASCULAR CATHETERIZATION N/A 05/11/2015   Procedure: Dialysis/Perma Catheter Insertion, exchange;  Surgeon: Katha Cabal, MD;  Location: [place] [facility];  Service: Cardiovascular;  Laterality: N/A;  . PERIPHERAL VASCULAR CATHETERIZATION N/A 05/18/2015   Procedure: Dialysis/Perma Catheter Insertion;  Surgeon: Katha Cabal, MD;  Location: [place] [facility];  Service: Cardiovascular;  Laterality: N/A;  . PERIPHERAL VASCULAR CATHETERIZATION Left 07/19/2015   Procedure: A/V Shuntogram/Fistulagram;  Surgeon: Algernon Huxley, MD;  Location: [place] [facility];  Service: Cardiovascular;  Laterality: Left;  . PERIPHERAL VASCULAR CATHETERIZATION N/A 07/19/2015   Procedure: A/V Shunt  Intervention;  Surgeon: Algernon Huxley, MD;  Location: [place] [facility];  Service: Cardiovascular;  Laterality: N/A;  . PERIPHERAL VASCULAR CATHETERIZATION N/A 08/26/2015   Procedure: Dialysis/Perma Catheter Removal;  Surgeon: Algernon Huxley, MD;  Location: [place] [facility];  Service: Cardiovascular;  Laterality: N/A;  .  PERIPHERAL VASCULAR THROMBECTOMY N/A 12/29/2016   Procedure: Peripheral Vascular Thrombectomy;  Surgeon: Katha Cabal, MD;  Location: [place] [facility];  Service: Cardiovascular;  Laterality: N/A;    Social History   Socioeconomic History  . Marital status: Single    Spouse name: Not on file  . Number of children: Not on file  . Years of education: Not on file  . Highest education level: Not on file  Occupational History  . Not on file  Social Needs  . Financial resource strain: Not on file  . Food insecurity    Worry: Not on file    Inability: Not on file  . Transportation needs    Medical: Not on file    Non-medical: Not on file  Tobacco Use  . Smoking status: Never Smoker  . Smokeless tobacco: Never Used  Substance and Sexual Activity  . Alcohol use: No  . Drug use: No  . Sexual activity: Not on file  Lifestyle  . Physical activity    Days per week: Not on file    Minutes per session: Not on file  . Stress: Not on file  Relationships  . Social Herbalist on phone: Not on file    Gets together: Not on file    Attends religious service: Not on file    Active member of club or organization: Not on file    Attends meetings of clubs or organizations: Not on file    Relationship status: Not on file  . Intimate partner violence    Fear of current or ex partner: Not on file    Emotionally abused: Not on file    Physically abused: Not on file    Forced sexual activity: Not on file  Other Topics Concern  . Not on file  Social History Narrative  . Not on file    Family History  Problem Relation Age of Onset  . Breast cancer Neg Hx     Allergies  Allergen Reactions  . Ancef [Cefazolin] Palpitations  . Contrast Media [Iodinated Diagnostic Agents] Palpitations     Review of Systems   Review of Systems: Negative Unless Checked Constitutional: [] Weight loss  [] Fever  [] Chills Cardiac: [] Chest pain   []  Atrial Fibrillation  [] Palpitations    [] Shortness of breath when laying flat   [] Shortness of breath with exertion. [] Shortness of breath at rest Vascular:  [] Pain in legs with walking   [] Pain in legs with standing [] Pain in legs when laying flat   [] Claudication    [] Pain in feet when laying flat    [] History of DVT   [] Phlebitis   [x] Swelling in legs   [] Varicose veins   [] Non-healing ulcers Pulmonary:   [] Uses home oxygen   [] Productive cough   [] Hemoptysis   [] Wheeze  [] COPD   [] Asthma Neurologic:  [] Dizziness   [] Seizures  [] Blackouts [] History of stroke   [] History of TIA  [] Aphasia   [] Temporary Blindness   [] Weakness or numbness in arm   [x] Weakness or numbness in leg Musculoskeletal:   [] Joint swelling   [] Joint pain   [] Low back pain  []  History of Knee Replacement [] Arthritis [] back Surgeries  []   Spinal Stenosis    Hematologic:  [] Easy bruising  [] Easy bleeding   [] Hypercoagulable state   [x] Anemic Gastrointestinal:  [] Diarrhea   [] Vomiting  [] Gastroesophageal reflux/heartburn   [] Difficulty swallowing. [] Abdominal pain Genitourinary:  [x] Chronic kidney disease   [] Difficult urination  [] Anuric   [] Blood in urine [] Frequent urination  [] Burning with urination   [] Hematuria Skin:  [] Rashes   [] Ulcers [] Wounds Psychological:  [] History of anxiety   []  History of major depression  []  Memory Difficulties      OBJECTIVE:   Physical Exam  BP 97/65 (BP Location: Left Arm)   Pulse 70   Resp 16   Wt 220 lb (99.8 kg)   BMI 38.97 kg/m   Gen: WD/WN, NAD Head: [place]/AT, No temporalis wasting.  Ear/Nose/Throat: Hearing grossly intact, nares w/o erythema or drainage Eyes: PER, EOMI, sclera nonicteric.  Neck: Supple, no masses.  No JVD.  Pulmonary:  Good air movement, no use of accessory muscles.  Cardiac: RRR Vascular:  Soft thrill and bruit of left thigh graft Vessel Right Left  Radial Palpable Palpable   Gastrointestinal: soft, non-distended. No guarding/no peritoneal signs.  Musculoskeletal: M/S 5/5 throughout.    Patient  has bilateral amputations of lower extremities. Neurologic: Pain and light touch intact in extremities.  Symmetrical.  Speech is fluent. Motor exam as listed above. Psychiatric: Judgment intact, Mood & affect appropriate for pt's clinical situation. Dermatologic: No Venous rashes. No Ulcers Noted.  No changes consistent with cellulitis. Lymph : No Cervical lymphadenopathy, no lichenification or skin changes of chronic lymphedema.       ASSESSMENT AND PLAN:  1. ESRD on dialysis [facility]) Recommend:  The patient is experiencing increasing problems with their dialysis access.  Patient should have a shuntogram left thigh graft with the intention for intervention.  The intention for intervention is to restore appropriate flow and prevent thrombosis and possible loss of the access.  As well as improve the quality of dialysis therapy.  The risks, benefits and alternative therapies were reviewed in detail with the patient.  All questions were answered.  The patient agrees to proceed with angio/intervention.      2. Type 2 diabetes mellitus with chronic kidney disease on chronic dialysis, with long-term current use of insulin (HCC) Continue hypoglycemic medications as already ordered, these medications have been reviewed and there are no changes at this time.  Hgb A1C to be monitored as already arranged by primary service   3. Hyperlipidemia, unspecified hyperlipidemia type Continue statin as ordered and reviewed, no changes at this time    Current Outpatient Medications on File Prior to Visit  Medication Sig Dispense Refill  . acetaminophen (TYLENOL) 500 MG tablet Take 1,000 mg by mouth every 8 (eight) hours as needed for moderate pain.    Marland Kitchen aspirin 81 MG tablet Take 81 mg by mouth daily.    Marland Kitchen atorvastatin (LIPITOR) 40 MG tablet Take 40 mg by mouth daily.    . calcitRIOL (ROCALTROL) 0.25 MCG capsule Take 0.25 mcg by mouth daily. Take 3 capsules by mouth three times week at dialysis    .  camphor-menthol (SARNA) lotion Apply 1 application topically as needed for itching. Twice a day if needed    . Cholecalciferol (VITAMIN D3) 2000 units TABS Take 2,000 Units by mouth daily.    . cinacalcet (SENSIPAR) 30 MG tablet Take 30 mg by mouth daily with supper.     . Dextromethorphan-guaiFENesin (MUCINEX DM) 30-600 MG TB12 Take by mouth.    . ferrous sulfate 325 (  65 FE) MG tablet Take 325 mg by mouth every other day.    . fluticasone (FLONASE) 50 MCG/ACT nasal spray Place into both nostrils daily.    Marland Kitchen glucose 4 GM chewable tablet Chew 1 tablet by mouth as needed for low blood sugar. If blood sugar less than 70. Chew 3 tablets and recheck blood sugar in 15 minutes    . insulin aspart (NOVOLOG) 100 UNIT/ML injection Inject 1 Units into the skin See admin instructions. Pt uses insulin PRN for Blood Sugar greater then 200 uses 1 unit if greater then 200     . lanthanum (FOSRENOL) 1000 MG chewable tablet Chew 1,000 mg by mouth 2 (two) times daily with a meal.     . liver oil-zinc oxide (DESITIN) 40 % ointment Apply 1 application topically as needed for irritation.    Marland Kitchen loperamide (IMODIUM A-D) 2 MG tablet Take 2 mg by mouth 4 (four) times daily as needed for diarrhea or loose stools. 2 tablets by mouth for onset of loose stool, than 1 tablet by mouth after each loose stool MAX 4 tablets in 24 hours    . loratadine (CLARITIN) 10 MG tablet Take 10 mg by mouth daily as needed for allergies.    Marland Kitchen nystatin (MYCOSTATIN) powder Apply 100,000 g topically 2 (two) times daily as needed. 100.000unit/gm     . Polyethyl Glycol-Propyl Glycol (SYSTANE) 0.4-0.3 % SOLN Place 1 drop into both eyes 4 (four) times daily as needed.     . polyethylene glycol (MIRALAX / GLYCOLAX) packet Take 17 g by mouth daily as needed for moderate constipation or severe constipation.    Marland Kitchen rOPINIRole (REQUIP) 0.25 MG tablet Take 0.25 mg by mouth at bedtime as needed.    . senna-docusate (SENOKOT-S) 8.6-50 MG tablet Take 1-2 tablets by  mouth at bedtime as needed for mild constipation or moderate constipation. 1-2 tablets at bedtime if needed     . Skin Protectants, Misc. (EUCERIN) cream Apply 1 application topically 4 (four) times daily as needed for dry skin.      No current facility-administered medications on file prior to visit.     There are no Patient Instructions on file for this visit. No follow-ups on file.   Kris Hartmann, NP  This note was completed with Sales executive.  Any errors are purely unintentional.

## 2018-11-29 ENCOUNTER — Other Ambulatory Visit (INDEPENDENT_AMBULATORY_CARE_PROVIDER_SITE_OTHER): Payer: Self-pay | Admitting: Nurse Practitioner

## 2018-12-02 ENCOUNTER — Other Ambulatory Visit
Admission: RE | Admit: 2018-12-02 | Discharge: 2018-12-02 | Disposition: A | Discharge status: Home / Self Care | Payer: Medicare (Managed Care) | Source: Ambulatory Visit | Attending: Vascular Surgery | Admitting: Vascular Surgery

## 2018-12-02 DIAGNOSIS — Z1159 Encounter for screening for other viral diseases: Secondary | ICD-10-CM | POA: Diagnosis present

## 2018-12-03 LAB — NOVEL CORONAVIRUS, NAA (HOSP ORDER, SEND-OUT TO REF LAB; TAT 18-24 HRS): SARS-CoV-2, NAA: NOT DETECTED

## 2018-12-05 ENCOUNTER — Ambulatory Visit
Admission: RE | Admit: 2018-12-05 | Discharge: 2018-12-05 | Disposition: A | Discharge status: Home / Self Care | Payer: Medicare (Managed Care) | Attending: Vascular Surgery | Admitting: Vascular Surgery

## 2018-12-05 ENCOUNTER — Other Ambulatory Visit: Payer: Self-pay

## 2018-12-05 ENCOUNTER — Encounter
Admission: RE | Disposition: A | Discharge status: Home / Self Care | Payer: Self-pay | Source: Home / Self Care | Attending: Vascular Surgery

## 2018-12-05 DIAGNOSIS — Z794 Long term (current) use of insulin: Secondary | ICD-10-CM | POA: Diagnosis not present

## 2018-12-05 DIAGNOSIS — T82858A Stenosis of vascular prosthetic devices, implants and grafts, initial encounter: Secondary | ICD-10-CM | POA: Insufficient documentation

## 2018-12-05 DIAGNOSIS — Z881 Allergy status to other antibiotic agents status: Secondary | ICD-10-CM | POA: Diagnosis not present

## 2018-12-05 DIAGNOSIS — Z95820 Peripheral vascular angioplasty status with implants and grafts: Secondary | ICD-10-CM | POA: Diagnosis not present

## 2018-12-05 DIAGNOSIS — Z89611 Acquired absence of right leg above knee: Secondary | ICD-10-CM | POA: Diagnosis not present

## 2018-12-05 DIAGNOSIS — Z79899 Other long term (current) drug therapy: Secondary | ICD-10-CM | POA: Diagnosis not present

## 2018-12-05 DIAGNOSIS — Z91041 Radiographic dye allergy status: Secondary | ICD-10-CM | POA: Insufficient documentation

## 2018-12-05 DIAGNOSIS — Z89612 Acquired absence of left leg above knee: Secondary | ICD-10-CM | POA: Insufficient documentation

## 2018-12-05 DIAGNOSIS — I120 Hypertensive chronic kidney disease with stage 5 chronic kidney disease or end stage renal disease: Secondary | ICD-10-CM | POA: Diagnosis not present

## 2018-12-05 DIAGNOSIS — M1990 Unspecified osteoarthritis, unspecified site: Secondary | ICD-10-CM | POA: Diagnosis not present

## 2018-12-05 DIAGNOSIS — E785 Hyperlipidemia, unspecified: Secondary | ICD-10-CM | POA: Diagnosis not present

## 2018-12-05 DIAGNOSIS — Z992 Dependence on renal dialysis: Secondary | ICD-10-CM | POA: Insufficient documentation

## 2018-12-05 DIAGNOSIS — Y832 Surgical operation with anastomosis, bypass or graft as the cause of abnormal reaction of the patient, or of later complication, without mention of misadventure at the time of the procedure: Secondary | ICD-10-CM | POA: Diagnosis not present

## 2018-12-05 DIAGNOSIS — I739 Peripheral vascular disease, unspecified: Secondary | ICD-10-CM | POA: Insufficient documentation

## 2018-12-05 DIAGNOSIS — E1122 Type 2 diabetes mellitus with diabetic chronic kidney disease: Secondary | ICD-10-CM | POA: Diagnosis not present

## 2018-12-05 DIAGNOSIS — N186 End stage renal disease: Secondary | ICD-10-CM | POA: Insufficient documentation

## 2018-12-05 DIAGNOSIS — Z7982 Long term (current) use of aspirin: Secondary | ICD-10-CM | POA: Diagnosis not present

## 2018-12-05 HISTORY — PX: A/V SHUNTOGRAM: CATH118297

## 2018-12-05 LAB — GLUCOSE, CAPILLARY
Glucose-Capillary: 57 mg/dL — ABNORMAL LOW (ref 70–99)
Glucose-Capillary: 66 mg/dL — ABNORMAL LOW (ref 70–99)
Glucose-Capillary: 107 mg/dL — ABNORMAL HIGH (ref 70–99)
Glucose-Capillary: 119 mg/dL — ABNORMAL HIGH (ref 70–99)

## 2018-12-05 LAB — POTASSIUM (ARMC VASCULAR LAB ONLY): Potassium (ARMC vascular lab): 3.5 (ref 3.5–5.1)

## 2018-12-05 SURGERY — A/V SHUNTOGRAM
Anesthesia: Moderate Sedation | Laterality: Left

## 2018-12-05 MED ORDER — MIDAZOLAM HCL 2 MG/2ML IJ SOLN
INTRAMUSCULAR | Status: DC | PRN
  Administered 2018-12-05: 2 mg via INTRAVENOUS

## 2018-12-05 MED ORDER — CLINDAMYCIN PHOSPHATE 300 MG/50ML IV SOLN
300.0000 mg | Freq: Once | INTRAVENOUS | Status: AC
  Administered 2018-12-05: 300 mg via INTRAVENOUS

## 2018-12-05 MED ORDER — LIDOCAINE-EPINEPHRINE (PF) 1 %-1:200000 IJ SOLN
INTRAMUSCULAR | Status: AC
  Filled 2018-12-05: qty 30

## 2018-12-05 MED ORDER — DIPHENHYDRAMINE HCL 50 MG/ML IJ SOLN
50.0000 mg | Freq: Once | INTRAMUSCULAR | Status: AC | PRN
  Administered 2018-12-05: 50 mg via INTRAVENOUS

## 2018-12-05 MED ORDER — FENTANYL CITRATE (PF) 100 MCG/2ML IJ SOLN
INTRAMUSCULAR | Status: AC
  Filled 2018-12-05: qty 2

## 2018-12-05 MED ORDER — MIDAZOLAM HCL 2 MG/2ML IJ SOLN
INTRAMUSCULAR | Status: AC
  Filled 2018-12-05: qty 2

## 2018-12-05 MED ORDER — ONDANSETRON HCL 4 MG/2ML IJ SOLN: 4.0000 mg | Freq: Four times a day (QID) | INTRAMUSCULAR | Status: DC | PRN

## 2018-12-05 MED ORDER — IODIXANOL 320 MG/ML IV SOLN
INTRAVENOUS | Status: DC | PRN
  Administered 2018-12-05: 30 mL via INTRAVENOUS

## 2018-12-05 MED ORDER — HYDROMORPHONE HCL 1 MG/ML IJ SOLN: 1.0000 mg | Freq: Once | INTRAMUSCULAR | Status: DC | PRN

## 2018-12-05 MED ORDER — FENTANYL CITRATE (PF) 100 MCG/2ML IJ SOLN
INTRAMUSCULAR | Status: DC | PRN
  Administered 2018-12-05: 50 ug via INTRAVENOUS

## 2018-12-05 MED ORDER — SODIUM CHLORIDE 0.9 % IV SOLN
INTRAVENOUS | Status: DC
  Administered 2018-12-05: 08:00:00 via INTRAVENOUS

## 2018-12-05 MED ORDER — HEPARIN SODIUM (PORCINE) 1000 UNIT/ML IJ SOLN
INTRAMUSCULAR | Status: AC
  Filled 2018-12-05: qty 1

## 2018-12-05 MED ORDER — HEPARIN SODIUM (PORCINE) 1000 UNIT/ML IJ SOLN
INTRAMUSCULAR | Status: DC | PRN
  Administered 2018-12-05: 3000 [IU] via INTRAVENOUS

## 2018-12-05 MED ORDER — FAMOTIDINE 20 MG PO TABS
40.0000 mg | ORAL_TABLET | Freq: Once | ORAL | Status: AC | PRN
  Administered 2018-12-05: 08:00:00 40 mg via ORAL

## 2018-12-05 MED ORDER — DEXTROSE 50 % IV SOLN
25.0000 mL | Freq: Once | INTRAVENOUS | Status: AC
  Administered 2018-12-05: 25 mL via INTRAVENOUS

## 2018-12-05 MED ORDER — METHYLPREDNISOLONE SODIUM SUCC 125 MG IJ SOLR
125.0000 mg | Freq: Once | INTRAMUSCULAR | Status: AC | PRN
  Administered 2018-12-05: 08:00:00 125 mg via INTRAVENOUS

## 2018-12-05 MED ORDER — MIDAZOLAM HCL 2 MG/ML PO SYRP: 8.0000 mg | ORAL_SOLUTION | Freq: Once | ORAL | Status: DC | PRN

## 2018-12-05 SURGICAL SUPPLY — 11 items
BALLN DORADO 7X60X80 (BALLOONS) ×3
BALLN LUTONIX DCB 7X60X130 (BALLOONS) ×3
BALLOON DORADO 7X60X80 (BALLOONS) ×1 IMPLANT
BALLOON LUTONIX DCB 7X60X130 (BALLOONS) ×1 IMPLANT
CANNULA 5F STIFF (CANNULA) ×3 IMPLANT
DEVICE PRESTO INFLATION (MISCELLANEOUS) ×3 IMPLANT
PACK ANGIOGRAPHY (CUSTOM PROCEDURE TRAY) ×3 IMPLANT
SHEATH BRITE TIP 6FRX5.5 (SHEATH) ×3 IMPLANT
SUT MNCRL AB 4-0 PS2 18 (SUTURE) ×3 IMPLANT
TOWEL OR 17X26 4PK STRL BLUE (TOWEL DISPOSABLE) ×3 IMPLANT
WIRE MAGIC TOR.035 180C (WIRE) ×3 IMPLANT

## 2018-12-05 NOTE — Progress Notes (Signed)
Patient clinically stable post fistulogram per Dr Lucky Cowboy. Vitals stable. Discharge instructions given to husband over phone in patient's room with questions answered. Denies complaints. Plan for discharge via [facility].

## 2018-12-05 NOTE — Discharge Instructions (Signed)
Dialysis Shuntogram, Care After  This sheet gives you information about how to care for yourself after your procedure. Your health care provider may also give you more specific instructions. If you have problems or questions, contact your health care provider. What can I expect after the procedure? After the procedure, it is common to have:  A small amount of discomfort in the area where the small, thin tube (catheter) was placed for the procedure.  A small amount of bruising around the fistula.  Sleepiness and tiredness (fatigue). Follow these instructions at home: Activity   Rest at home and do not lift anything that is heavier than 5 lb (2.3 kg) on the day after your procedure.  Return to your normal activities as told by your health care provider. Ask your health care provider what activities are safe for you.  Do not drive or use heavy machinery while taking prescription pain medicine.  Do not drive for 24 hours if you were given a medicine to help you relax (sedative) during your procedure. Medicines   Take over-the-counter and prescription medicines only as told by your health care provider. Puncture site care  Follow instructions from your health care provider about how to take care of the site where catheters were inserted. Make sure you: ? Wash your hands with soap and water before you change your bandage (dressing). If soap and water are not available, use hand sanitizer. ? Change your dressing as told by your health care provider. ? Leave stitches (sutures), skin glue, or adhesive strips in place. These skin closures may need to stay in place for 2 weeks or longer. If adhesive strip edges start to loosen and curl up, you may trim the loose edges. Do not remove adhesive strips completely unless your health care provider tells you to do that.  Check your puncture area every day for signs of infection. Check for: ? Redness, swelling, or pain. ? Fluid or  blood. ? Warmth. ? Pus or a bad smell. General instructions  Do not take baths, swim, or use a hot tub until your health care provider approves. Ask your health care provider if you may take showers. You may only be allowed to take sponge baths.  Monitor your dialysis fistula closely. Check to make sure that you can feel a vibration or buzz (a thrill) when you put your fingers over the fistula.  Prevent damage to your graft or fistula: ? Do not wear tight-fitting clothing or jewelry on the arm or leg that has your graft or fistula. ? Tell all your health care providers that you have a dialysis fistula or graft. ? Do not allow blood draws, IVs, or blood pressure readings to be done in the arm that has your fistula or graft. ? Do not allow flu shots or vaccinations in the arm with your fistula or graft.  Keep all follow-up visits as told by your health care provider. This is important. Contact a health care provider if:  You have redness, swelling, or pain at the site where the catheter was put in.  You have fluid or blood coming from the catheter site.  The catheter site feels warm to the touch.  You have pus or a bad smell coming from the catheter site.  You have a fever or chills. Get help right away if:  You feel weak.  You have trouble balancing.  You have trouble moving your arms or legs.  You have problems with your speech or vision.  You can no longer feel a vibration or buzz when you put your fingers over your dialysis fistula.  The limb that was used for the procedure: ? Swells. ? Is painful. ? Is cold. ? Is discolored, such as blue or pale white.  You have chest pain or shortness of breath. Summary  After a dialysis fistulogram, it is common to have a small amount of discomfort or bruising in the area where the small, thin tube (catheter) was placed.  Rest at home on the day after your procedure. Return to your normal activities as told by your health care  provider.  Take over-the-counter and prescription medicines only as told by your health care provider.  Follow instructions from your health care provider about how to take care of the site where the catheter was inserted.  Keep all follow-up visits as told by your health care provider. This information is not intended to replace advice given to you by your health care provider. Make sure you discuss any questions you have with your health care provider. Document Released: 10/20/2013 Document Revised: 07/06/2017 Document Reviewed: 07/06/2017 Elsevier Interactive Patient Education  2019 [place].   Moderate Conscious Sedation, Adult, Care After These instructions provide you with information about caring for yourself after your procedure. Your health care provider may also give you more specific instructions. Your treatment has been planned according to current medical practices, but problems sometimes occur. Call your health care provider if you have any problems or questions after your procedure. What can I expect after the procedure? After your procedure, it is common:  To feel sleepy for several hours.  To feel clumsy and have poor balance for several hours.  To have poor judgment for several hours.  To vomit if you eat too soon. Follow these instructions at home: For at least 24 hours after the procedure:   Do not: ? Participate in activities where you could fall or become injured. ? Drive. ? Use heavy machinery. ? Drink alcohol. ? Take sleeping pills or medicines that cause drowsiness. ? Make important decisions or sign legal documents. ? Take care of children on your own.  Rest. Eating and drinking  Follow the diet recommended by your health care provider.  If you vomit: ? Drink water, juice, or soup when you can drink without vomiting. ? Make sure you have little or no nausea before eating solid foods. General instructions  Have a responsible adult stay with  you until you are awake and alert.  Take over-the-counter and prescription medicines only as told by your health care provider.  If you smoke, do not smoke without supervision.  Keep all follow-up visits as told by your health care provider. This is important. Contact a health care provider if:  You keep feeling nauseous or you keep vomiting.  You feel light-headed.  You develop a rash.  You have a fever. Get help right away if:  You have trouble breathing. This information is not intended to replace advice given to you by your health care provider. Make sure you discuss any questions you have with your health care provider. Document Released: 03/26/2013 Document Revised: 11/08/2015 Document Reviewed: 09/25/2015 Elsevier Interactive Patient Education  2019 [employer].

## 2018-12-05 NOTE — Op Note (Signed)
[facility]    OPERATIVE NOTE   PROCEDURE: 1.  Left thigh arteriovenous graft cannulation under ultrasound guidance 2.  Left thigh shuntogram 3.  Percutaneous transluminal angioplasty of the mid graft with 7 mm diameter Lutonix drug-coated angioplasty balloon and of the venous anastomosis, mid graft, and arterial access site with 7 mm diameter high-pressure angioplasty balloon  PRE-OPERATIVE DIAGNOSIS: 1. ESRD 2. Malfunctioning left thigh arteriovenous graft  POST-OPERATIVE DIAGNOSIS: same as above   SURGEON: [name], MD  ANESTHESIA: local with MCS  ESTIMATED BLOOD LOSS: 5 cc  FINDING(S): 1. 55% stenosis of the venous anastomosis, 80% stenosis of the venous access site, and 60% stenosis of the arterial access site.  The arterial anastomosis was widely patent.  The central venous flow with good without stenosis.  SPECIMEN(S):  None  CONTRAST: 30 cc  FLUORO TIME: 2.7 minutes  MODERATE CONSCIOUS SEDATION TIME:  Approximately 20 minutes using 2 mg of Versed and 50 mcg of Fentanyl  INDICATIONS: Amy Wu is a 68 y.o. female who presents with malfunctioning left thigh arteriovenous graft.  The patient is scheduled for left thigh shuntogram.  The patient is aware the risks include but are not limited to: bleeding, infection, thrombosis of the cannulated access, and possible anaphylactic reaction to the contrast.  The patient is aware of the risks of the procedure and elects to proceed forward.  DESCRIPTION: After full informed written consent was obtained, the patient was brought back to the angiography suite and placed supine upon the angiography table.  The patient was connected to monitoring equipment. Moderate conscious sedation was administered during a face to face encounter throughout the procedure with my supervision of the RN administering medicines and monitoring the patient's vital signs, pulse oximetry, telemetry and mental status throughout  from the start of the procedure until the patient was taken to the recovery room The left thigh was prepped and draped in the standard fashion for a percutaneous access intervention.  Under ultrasound guidance, the left femoral artery to femoral vein arteriovenous graft was cannulated with a micropuncture needle under direct ultrasound guidance were it was patent and a permanent image was performed.  The microwire was advanced into the graft and the needle was exchanged for the a microsheath.  I then upsized to a 6 Fr Sheath and imaging was performed.  Hand injections were completed to image the access including the central venous system. This demonstrated 55% stenosis of the venous anastomosis, 80% stenosis of the venous access site, and 60% stenosis of the arterial access site.  The arterial anastomosis was widely patent.  The central venous flow with good without stenosis.  Based on the images, this patient will need intervention to these areas of stenosis. I then gave the patient 3000 units of intravenous heparin.  I then crossed the stenoses with a Magic Tourqe wire.  Based on the imaging, a 7 mm x 6 cm Lutonix drug-coated angioplasty balloon was selected.  The balloon was centered around the most significant stenosis which was the venous access site stenosis and inflated to 14 ATM but the balloon burst.  I then exchanged for a 7 mm diameter high-pressure angioplasty balloon and use this to treat all 3 lesions.  The venous anastomotic lesion was treated with an inflation to 24 atm for 1 minute.  The venous access site stenosis was also treated to 24 atm for 1 minute.  At the arterial access site, we inflated to 20 atm for 1 minute.  On completion imaging, a 20-25 % residual stenosis was present at the arterial access site, a 10% stenosis was noted at the venous access site, and less than 10% stenosis was noted at the venous anastomosis.     Based on the completion imaging, no further intervention is  necessary.  The wire and balloon were removed from the sheath.  A 4-0 Monocryl purse-string suture was sewn around the sheath.  The sheath was removed while tying down the suture.  A sterile bandage was applied to the puncture site.  COMPLICATIONS: None  CONDITION: Stable   [name]  12/05/2018 10:05 AM    This note was created with Dragon Medical transcription system. Any errors in dictation are purely unintentional.

## 2018-12-05 NOTE — H&P (Signed)
[facility] History & Physical Update  The patient was interviewed and re-examined.  The patient's previous History and Physical has been reviewed and is unchanged.  There is no change in the plan of care. We plan to proceed with the scheduled procedure.  [name], MD  12/05/2018, 8:32 AM

## 2018-12-06 ENCOUNTER — Encounter: Payer: Self-pay | Admitting: Vascular Surgery

## 2019-01-16 ENCOUNTER — Other Ambulatory Visit (INDEPENDENT_AMBULATORY_CARE_PROVIDER_SITE_OTHER): Payer: Medicare (Managed Care)

## 2019-01-16 ENCOUNTER — Other Ambulatory Visit (INDEPENDENT_AMBULATORY_CARE_PROVIDER_SITE_OTHER): Payer: Self-pay | Admitting: Vascular Surgery

## 2019-01-16 ENCOUNTER — Encounter (INDEPENDENT_AMBULATORY_CARE_PROVIDER_SITE_OTHER): Payer: Self-pay | Admitting: Nurse Practitioner

## 2019-01-16 ENCOUNTER — Ambulatory Visit (INDEPENDENT_AMBULATORY_CARE_PROVIDER_SITE_OTHER): Payer: Medicare (Managed Care) | Admitting: Nurse Practitioner

## 2019-01-16 ENCOUNTER — Other Ambulatory Visit: Payer: Self-pay

## 2019-01-16 VITALS — BP 121/66 | HR 69 | Resp 16 | Wt 226.0 lb

## 2019-01-16 DIAGNOSIS — Z9862 Peripheral vascular angioplasty status: Secondary | ICD-10-CM

## 2019-01-16 DIAGNOSIS — N186 End stage renal disease: Secondary | ICD-10-CM

## 2019-01-16 DIAGNOSIS — E785 Hyperlipidemia, unspecified: Secondary | ICD-10-CM

## 2019-01-16 DIAGNOSIS — E1122 Type 2 diabetes mellitus with diabetic chronic kidney disease: Secondary | ICD-10-CM

## 2019-01-16 DIAGNOSIS — Z794 Long term (current) use of insulin: Secondary | ICD-10-CM

## 2019-01-16 DIAGNOSIS — Z992 Dependence on renal dialysis: Secondary | ICD-10-CM

## 2019-01-16 NOTE — Progress Notes (Signed)
SUBJECTIVE:  Patient ID: Amy Wu, female    DOB: [birth year], 68 y.o.   MRN: [id number] Chief Complaint  Patient presents with  . Follow-up    [facility] 6week ultrasound    HPI  Amy Wu is a 68 y.o. female The patient returns to the office for followup status post intervention of the dialysis access left thigh AV graft. Following the intervention on 12/05/2018 the access function has significantly improved, with better flow rates and improved KT/V. The patient has not been experiencing increased bleeding times following decannulation and the patient denies increased recirculation. The patient denies an increase in arm swelling. At the present time the patient denies hand pain.  The patient denies amaurosis fugax or recent TIA symptoms. There are no recent neurological changes noted. The patient denies claudication symptoms or rest pain symptoms. The patient denies history of DVT, PE or superficial thrombophlebitis. The patient denies recent episodes of angina or shortness of breath.   Duplex ultrasound of the AV access shows a patent access with uniform velocities.  No focal hemodynamically significant stenosis.  Flow volume is 1541.  The comparison study on 11/28/2018 showed a hemodynamically significant stenosis in the mid thigh graft area.      Past Medical History:  Diagnosis Date  . Arthritis   . Chronic kidney disease   . Diabetes mellitus without complication ([place])   . Hyperlipidemia   . Hypertension   . Peripheral vascular disease [facility])     Past Surgical History:  Procedure Laterality Date  . A/V SHUNTOGRAM Left 02/16/2017   Procedure: A/V Shuntogram;  Surgeon: Algernon Huxley, MD;  Location: [place] [facility];  Service: Cardiovascular;  Laterality: Left;  . A/V SHUNTOGRAM Left 12/13/2017   Procedure: A/V SHUNTOGRAM;  Surgeon: Algernon Huxley, MD;  Location: [place] [facility];  Service: Cardiovascular;  Laterality: Left;  . A/V SHUNTOGRAM Left 12/05/2018   Procedure: A/V SHUNTOGRAM;  Surgeon: Algernon Huxley, MD;  Location: [place] [facility];  Service: Cardiovascular;  Laterality: Left;  . ABOVE KNEE LEG AMPUTATION    . AV FISTULA PLACEMENT Bilateral   . AV FISTULA PLACEMENT Left 07/01/2015   Procedure: INSERTION OF ARTERIOVENOUS (AV) GRAFT THIGH (ARTEGRAFT);  Surgeon: Algernon Huxley, MD;  Location: [facility];  Service: Vascular;  Laterality: Left;  . BELOW KNEE LEG AMPUTATION Left   . ENDARTERECTOMY FEMORAL Left 07/01/2015   Procedure: SUPERFICIAL FEMORAL ENDARTERECTOMY ;  Surgeon: Algernon Huxley, MD;  Location: [facility];  Service: Vascular;  Laterality: Left;  . EXCHANGE OF A DIALYSIS CATHETER  02/11/2015   Procedure: Exchange Of A Dialysis Catheter;  Surgeon: Algernon Huxley, MD;  Location: [place] [facility];  Service: Cardiovascular;;  . LEG AMPUTATION ABOVE KNEE Right   . PERIPHERAL VASCULAR CATHETERIZATION N/A 02/11/2015   Procedure: Dialysis/Perma Catheter Insertion;  Surgeon: Algernon Huxley, MD;  Location: [place] [facility];  Service: Cardiovascular;  Laterality: N/A;  . PERIPHERAL VASCULAR CATHETERIZATION N/A 05/11/2015   Procedure: Dialysis/Perma Catheter Insertion, exchange;  Surgeon: Katha Cabal, MD;  Location: [place] [facility];  Service: Cardiovascular;  Laterality: N/A;  . PERIPHERAL VASCULAR CATHETERIZATION N/A 05/18/2015   Procedure: Dialysis/Perma Catheter Insertion;  Surgeon: Katha Cabal, MD;  Location: [place] [facility];  Service: Cardiovascular;  Laterality: N/A;  . PERIPHERAL VASCULAR CATHETERIZATION Left 07/19/2015   Procedure: A/V Shuntogram/Fistulagram;  Surgeon: Algernon Huxley, MD;  Location: [place] [facility];  Service: Cardiovascular;  Laterality: Left;  .  PERIPHERAL VASCULAR CATHETERIZATION N/A 07/19/2015   Procedure: A/V Shunt Intervention;  Surgeon: Algernon Huxley, MD;  Location: [place] [facility];  Service: Cardiovascular;  Laterality: N/A;  . PERIPHERAL VASCULAR CATHETERIZATION N/A 08/26/2015   Procedure:  Dialysis/Perma Catheter Removal;  Surgeon: Algernon Huxley, MD;  Location: [place] [facility];  Service: Cardiovascular;  Laterality: N/A;  . PERIPHERAL VASCULAR THROMBECTOMY N/A 12/29/2016   Procedure: Peripheral Vascular Thrombectomy;  Surgeon: Katha Cabal, MD;  Location: [place] [facility];  Service: Cardiovascular;  Laterality: N/A;    Social History   Socioeconomic History  . Marital status: Single    Spouse name: Not on file  . Number of children: Not on file  . Years of education: Not on file  . Highest education level: Not on file  Occupational History  . Not on file  Social Needs  . Financial resource strain: Not on file  . Food insecurity    Worry: Not on file    Inability: Not on file  . Transportation needs    Medical: Not on file    Non-medical: Not on file  Tobacco Use  . Smoking status: Never Smoker  . Smokeless tobacco: Never Used  Substance and Sexual Activity  . Alcohol use: No  . Drug use: No  . Sexual activity: Not on file  Lifestyle  . Physical activity    Days per week: Not on file    Minutes per session: Not on file  . Stress: Not on file  Relationships  . Social Herbalist on phone: Not on file    Gets together: Not on file    Attends religious service: Not on file    Active member of club or organization: Not on file    Attends meetings of clubs or organizations: Not on file    Relationship status: Not on file  . Intimate partner violence    Fear of current or ex partner: Not on file    Emotionally abused: Not on file    Physically abused: Not on file    Forced sexual activity: Not on file  Other Topics Concern  . Not on file  Social History Narrative  . Not on file    Family History  Problem Relation Age of Onset  . Breast cancer Neg Hx     Allergies  Allergen Reactions  . Ancef [Cefazolin] Palpitations  . Contrast Media [Iodinated Diagnostic Agents] Palpitations     Review of Systems   Review of Systems:  Negative Unless Checked Constitutional: [] Weight loss  [] Fever  [] Chills Cardiac: [] Chest pain   []  Atrial Fibrillation  [] Palpitations   [] Shortness of breath when laying flat   [] Shortness of breath with exertion. [] Shortness of breath at rest Vascular:  [] Pain in legs with walking   [] Pain in legs with standing [] Pain in legs when laying flat   [] Claudication    [] Pain in feet when laying flat    [] History of DVT   [] Phlebitis   [] Swelling in legs   [] Varicose veins   [] Non-healing ulcers Pulmonary:   [] Uses home oxygen   [] Productive cough   [] Hemoptysis   [] Wheeze  [] COPD   [] Asthma Neurologic:  [] Dizziness   [] Seizures  [] Blackouts [] History of stroke   [] History of TIA  [] Aphasia   [] Temporary Blindness   [] Weakness or numbness in arm   [x] Weakness or numbness in leg Musculoskeletal:   [] Joint swelling   [] Joint pain   [] Low back pain  []   History of Knee Replacement [] Arthritis [] back Surgeries  []  Spinal Stenosis    Hematologic:  [] Easy bruising  [] Easy bleeding   [] Hypercoagulable state   [x] Anemic Gastrointestinal:  [] Diarrhea   [] Vomiting  [] Gastroesophageal reflux/heartburn   [] Difficulty swallowing. [] Abdominal pain Genitourinary:  [x] Chronic kidney disease   [] Difficult urination  [] Anuric   [] Blood in urine [] Frequent urination  [] Burning with urination   [] Hematuria Skin:  [] Rashes   [] Ulcers [] Wounds Psychological:  [] History of anxiety   []  History of major depression  []  Memory Difficulties      OBJECTIVE:   Physical Exam  BP 121/66 (BP Location: Left Arm)   Pulse 69   Resp 16   Wt 226 lb (102.5 kg)   BMI 47.23 kg/m   Gen: WD/WN, NAD Head: [place]AT, No temporalis wasting.  Ear/Nose/Throat: Hearing grossly intact, nares w/o erythema or drainage Eyes: PER, EOMI, sclera nonicteric.  Neck: Supple, no masses.  No JVD.  Pulmonary:  Good air movement, no use of accessory muscles.  Cardiac: RRR Vascular:  Good thrill and bruit Vessel Right Left  Radial Palpable Palpable    Gastrointestinal: soft, non-distended. No guarding/no peritoneal signs.  Musculoskeletal: M/S 5/5 throughout.    Bilateral amputee Neurologic: Pain and light touch intact in extremities.  Symmetrical.  Speech is fluent. Motor exam as listed above. Psychiatric: Judgment intact, Mood & affect appropriate for pt's clinical situation. Dermatologic: No Venous rashes. No Ulcers Noted.  No changes consistent with cellulitis. Lymph : No Cervical lymphadenopathy, no lichenification or skin changes of chronic lymphedema.       ASSESSMENT AND PLAN:  1. ESRD on dialysis ([place]) Duplex ultrasound of the AV access shows a patent access with uniform velocities.  No focal hemodynamically significant stenosis.  Flow volume is 1541.  The comparison study on 11/28/2018 showed a hemodynamically significant stenosis in the mid thigh graft area.   Recommend:  The patient is doing well and currently has adequate dialysis access. The patient's dialysis center is not reporting any access issues. Flow pattern is stable when compared to the prior ultrasound.  The patient should have a duplex ultrasound of the dialysis access in 6 months. The patient will follow-up with me in the office after each ultrasound    - VAS [place] (AVF, AVG); Future  2. Type 2 diabetes mellitus with chronic kidney disease on chronic dialysis, with long-term current use of insulin (HCC) Continue hypoglycemic medications as already ordered, these medications have been reviewed and there are no changes at this time.  Hgb A1C to be monitored as already arranged by primary service   3. Hyperlipidemia, unspecified hyperlipidemia type Continue statin as ordered and reviewed, no changes at this time    Current Outpatient Medications on File Prior to Visit  Medication Sig Dispense Refill  . acetaminophen (TYLENOL) 500 MG tablet Take 1,000 mg by mouth every 8 (eight) hours as needed for moderate pain.    Marland Kitchen aspirin 81 MG  tablet Take 81 mg by mouth daily.    Marland Kitchen atorvastatin (LIPITOR) 40 MG tablet Take 40 mg by mouth daily.    . calcitRIOL (ROCALTROL) 0.25 MCG capsule Take 0.25 mcg by mouth daily. Take 3 capsules by mouth three times week at dialysis    . camphor-menthol (SARNA) lotion Apply 1 application topically as needed for itching. Twice a day if needed    . Cholecalciferol (VITAMIN D3) 2000 units TABS Take 2,000 Units by mouth daily.    . cinacalcet (SENSIPAR) 30 MG tablet  Take 30 mg by mouth daily with supper.     . Dextromethorphan-guaiFENesin (MUCINEX DM) 30-600 MG TB12 Take by mouth.    . ferrous sulfate 325 (65 FE) MG tablet Take 325 mg by mouth every other day.    . fluticasone (FLONASE) 50 MCG/ACT nasal spray Place into both nostrils daily.    Marland Kitchen glucose 4 GM chewable tablet Chew 1 tablet by mouth as needed for low blood sugar. If blood sugar less than 70. Chew 3 tablets and recheck blood sugar in 15 minutes    . insulin aspart (NOVOLOG) 100 UNIT/ML injection Inject 1 Units into the skin See admin instructions. Pt uses insulin PRN for Blood Sugar greater then 200 uses 1 unit if greater then 200     . lanthanum (FOSRENOL) 1000 MG chewable tablet Chew 1,000 mg by mouth 2 (two) times daily with a meal.     . liver oil-zinc oxide (DESITIN) 40 % ointment Apply 1 application topically as needed for irritation.    Marland Kitchen loperamide (IMODIUM A-D) 2 MG tablet Take 2 mg by mouth 4 (four) times daily as needed for diarrhea or loose stools. 2 tablets by mouth for onset of loose stool, than 1 tablet by mouth after each loose stool MAX 4 tablets in 24 hours    . loratadine (CLARITIN) 10 MG tablet Take 10 mg by mouth daily as needed for allergies.    Marland Kitchen nystatin (MYCOSTATIN) powder Apply 100,000 g topically 2 (two) times daily as needed. 100.000unit/gm     . Polyethyl Glycol-Propyl Glycol (SYSTANE) 0.4-0.3 % SOLN Place 1 drop into both eyes 4 (four) times daily as needed.     . polyethylene glycol (MIRALAX / GLYCOLAX) packet  Take 17 g by mouth daily as needed for moderate constipation or severe constipation.    Marland Kitchen rOPINIRole (REQUIP) 0.25 MG tablet Take 0.25 mg by mouth at bedtime as needed.    . senna-docusate (SENOKOT-S) 8.6-50 MG tablet Take 1-2 tablets by mouth at bedtime as needed for mild constipation or moderate constipation. 1-2 tablets at bedtime if needed     . Skin Protectants, Misc. (EUCERIN) cream Apply 1 application topically 4 (four) times daily as needed for dry skin.      No current facility-administered medications on file prior to visit.     There are no Patient Instructions on file for this visit. No follow-ups on file.   Kris Hartmann, NP  This note was completed with Sales executive.  Any errors are purely unintentional.

## 2019-02-25 ENCOUNTER — Encounter (INDEPENDENT_AMBULATORY_CARE_PROVIDER_SITE_OTHER): Payer: Medicare (Managed Care)

## 2019-02-25 ENCOUNTER — Ambulatory Visit (INDEPENDENT_AMBULATORY_CARE_PROVIDER_SITE_OTHER): Payer: Medicare (Managed Care) | Admitting: Nurse Practitioner

## 2019-05-09 ENCOUNTER — Other Ambulatory Visit: Payer: Self-pay | Admitting: Family

## 2019-05-09 DIAGNOSIS — Z1231 Encounter for screening mammogram for malignant neoplasm of breast: Secondary | ICD-10-CM

## 2019-07-02 ENCOUNTER — Ambulatory Visit
Admission: RE | Admit: 2019-07-02 | Discharge: 2019-07-02 | Disposition: A | Discharge status: Home / Self Care | Payer: Medicare (Managed Care) | Source: Ambulatory Visit | Attending: Family | Admitting: Family

## 2019-07-02 DIAGNOSIS — Z1231 Encounter for screening mammogram for malignant neoplasm of breast: Secondary | ICD-10-CM | POA: Insufficient documentation

## 2019-07-22 ENCOUNTER — Ambulatory Visit (INDEPENDENT_AMBULATORY_CARE_PROVIDER_SITE_OTHER): Payer: Medicare (Managed Care) | Admitting: Nurse Practitioner

## 2019-07-22 ENCOUNTER — Ambulatory Visit (INDEPENDENT_AMBULATORY_CARE_PROVIDER_SITE_OTHER): Payer: Medicare (Managed Care)

## 2019-07-22 ENCOUNTER — Encounter (INDEPENDENT_AMBULATORY_CARE_PROVIDER_SITE_OTHER): Payer: Self-pay | Admitting: Nurse Practitioner

## 2019-07-22 ENCOUNTER — Other Ambulatory Visit: Payer: Self-pay

## 2019-07-22 VITALS — BP 126/72 | HR 85 | Resp 16 | Wt 222.0 lb

## 2019-07-22 DIAGNOSIS — N186 End stage renal disease: Secondary | ICD-10-CM

## 2019-07-22 DIAGNOSIS — Z992 Dependence on renal dialysis: Secondary | ICD-10-CM

## 2019-07-22 DIAGNOSIS — E1122 Type 2 diabetes mellitus with diabetic chronic kidney disease: Secondary | ICD-10-CM | POA: Diagnosis not present

## 2019-07-22 DIAGNOSIS — Z794 Long term (current) use of insulin: Secondary | ICD-10-CM

## 2019-07-22 DIAGNOSIS — E785 Hyperlipidemia, unspecified: Secondary | ICD-10-CM | POA: Diagnosis not present

## 2019-07-22 NOTE — Progress Notes (Signed)
SUBJECTIVE:  Patient ID: Amy Wu, female    DOB: Nov 27, 1950, 69 y.o.   MRN: [id number] Chief Complaint  Patient presents with  . Follow-up    ultrasound follow up     HPI  Amy Wu is a 69 y.o. female The patient returns to the office for followup of their dialysis access. The function of the access has been stable. The patient denies increased bleeding time or increased recirculation. Patient denies difficulty with cannulation. The patient denies hand pain or other symptoms consistent with steal phenomena.  No significant arm swelling.  The patient denies redness or swelling at the access site. The patient denies fever or chills at home or while on dialysis.  The patient denies amaurosis fugax or recent TIA symptoms. There are no recent neurological changes noted. The patient denies claudication symptoms or rest pain symptoms. The patient denies history of DVT, PE or superficial thrombophlebitis. The patient denies recent episodes of angina or shortness of breath.   Duplex ultrasound of the AV access shows a patent access with uniform velocities.  No focal hemodynamically significant stenosis. Flow volume of 1699.      Past Medical History:  Diagnosis Date  . Arthritis   . Chronic kidney disease   . Diabetes mellitus without complication ([place])   . Hyperlipidemia   . Hypertension   . Peripheral vascular disease [facility])     Past Surgical History:  Procedure Laterality Date  . A/V SHUNTOGRAM Left 02/16/2017   Procedure: A/V Shuntogram;  Surgeon: Algernon Huxley, MD;  Location: [place] [facility];  Service: Cardiovascular;  Laterality: Left;  . A/V SHUNTOGRAM Left 12/13/2017   Procedure: A/V SHUNTOGRAM;  Surgeon: Algernon Huxley, MD;  Location: [place] [facility];  Service: Cardiovascular;  Laterality: Left;  . A/V SHUNTOGRAM Left 12/05/2018   Procedure: A/V SHUNTOGRAM;  Surgeon: Algernon Huxley, MD;  Location: [place] [facility];  Service: Cardiovascular;  Laterality:  Left;  . ABOVE KNEE LEG AMPUTATION    . AV FISTULA PLACEMENT Bilateral   . AV FISTULA PLACEMENT Left 07/01/2015   Procedure: INSERTION OF ARTERIOVENOUS (AV) GRAFT THIGH (ARTEGRAFT);  Surgeon: Algernon Huxley, MD;  Location: [facility];  Service: Vascular;  Laterality: Left;  . BELOW KNEE LEG AMPUTATION Left   . ENDARTERECTOMY FEMORAL Left 07/01/2015   Procedure: SUPERFICIAL FEMORAL ENDARTERECTOMY ;  Surgeon: Algernon Huxley, MD;  Location: [facility];  Service: Vascular;  Laterality: Left;  . EXCHANGE OF A DIALYSIS CATHETER  02/11/2015   Procedure: Exchange Of A Dialysis Catheter;  Surgeon: Algernon Huxley, MD;  Location: [place] [facility];  Service: Cardiovascular;;  . LEG AMPUTATION ABOVE KNEE Right   . PERIPHERAL VASCULAR CATHETERIZATION N/A 02/11/2015   Procedure: Dialysis/Perma Catheter Insertion;  Surgeon: Algernon Huxley, MD;  Location: [place] [facility];  Service: Cardiovascular;  Laterality: N/A;  . PERIPHERAL VASCULAR CATHETERIZATION N/A 05/11/2015   Procedure: Dialysis/Perma Catheter Insertion, exchange;  Surgeon: Katha Cabal, MD;  Location: [place] [facility];  Service: Cardiovascular;  Laterality: N/A;  . PERIPHERAL VASCULAR CATHETERIZATION N/A 05/18/2015   Procedure: Dialysis/Perma Catheter Insertion;  Surgeon: Katha Cabal, MD;  Location: [place] [facility];  Service: Cardiovascular;  Laterality: N/A;  . PERIPHERAL VASCULAR CATHETERIZATION Left 07/19/2015   Procedure: A/V Shuntogram/Fistulagram;  Surgeon: Algernon Huxley, MD;  Location: [place] [facility];  Service: Cardiovascular;  Laterality: Left;  . PERIPHERAL VASCULAR CATHETERIZATION N/A 07/19/2015   Procedure: A/V Shunt Intervention;  Surgeon: Algernon Huxley, MD;  Location: [place] [facility];  Service: Cardiovascular;  Laterality: N/A;  . PERIPHERAL VASCULAR CATHETERIZATION N/A 08/26/2015   Procedure: Dialysis/Perma Catheter Removal;  Surgeon: Algernon Huxley, MD;  Location: [place] [facility];  Service: Cardiovascular;  Laterality:  N/A;  . PERIPHERAL VASCULAR THROMBECTOMY N/A 12/29/2016   Procedure: Peripheral Vascular Thrombectomy;  Surgeon: Katha Cabal, MD;  Location: [place] [facility];  Service: Cardiovascular;  Laterality: N/A;    Social History   Socioeconomic History  . Marital status: Single    Spouse name: Not on file  . Number of children: Not on file  . Years of education: Not on file  . Highest education level: Not on file  Occupational History  . Not on file  Tobacco Use  . Smoking status: Never Smoker  . Smokeless tobacco: Never Used  Substance and Sexual Activity  . Alcohol use: No  . Drug use: No  . Sexual activity: Not on file  Other Topics Concern  . Not on file  Social History Narrative  . Not on file   Social Determinants of Health   Financial Resource Strain:   . Difficulty of Paying Living Expenses: Not on file  Food Insecurity:   . Worried About Charity fundraiser in the Last Year: Not on file  . Ran Out of Food in the Last Year: Not on file  Transportation Needs:   . Lack of Transportation (Medical): Not on file  . Lack of Transportation (Non-Medical): Not on file  Physical Activity:   . Days of Exercise per Week: Not on file  . Minutes of Exercise per Session: Not on file  Stress:   . Feeling of Stress : Not on file  Social Connections:   . Frequency of Communication with Friends and Family: Not on file  . Frequency of Social Gatherings with Friends and Family: Not on file  . Attends Religious Services: Not on file  . Active Member of Clubs or Organizations: Not on file  . Attends Archivist Meetings: Not on file  . Marital Status: Not on file  Intimate Partner Violence:   . Fear of Current or Ex-Partner: Not on file  . Emotionally Abused: Not on file  . Physically Abused: Not on file  . Sexually Abused: Not on file    Family History  Problem Relation Age of Onset  . Breast cancer Neg Hx     Allergies  Allergen Reactions  . Ancef  [Cefazolin] Palpitations  . Contrast Media [Iodinated Diagnostic Agents] Palpitations     Review of Systems   Review of Systems: Negative Unless Checked Constitutional: [] Weight loss  [] Fever  [] Chills Cardiac: [] Chest pain   []  Atrial Fibrillation  [] Palpitations   [] Shortness of breath when laying flat   [] Shortness of breath with exertion. [] Shortness of breath at rest Vascular:  [] Pain in legs with walking   [] Pain in legs with standing [] Pain in legs when laying flat   [] Claudication    [] Pain in feet when laying flat    [] History of DVT   [] Phlebitis   [] Swelling in legs   [] Varicose veins   [] Non-healing ulcers Pulmonary:   [] Uses home oxygen   [] Productive cough   [] Hemoptysis   [] Wheeze  [] COPD   [] Asthma Neurologic:  [] Dizziness   [] Seizures  [] Blackouts [] History of stroke   [] History of TIA  [] Aphasia   [] Temporary Blindness   [] Weakness or numbness in arm   [x] Weakness  or numbness in leg Musculoskeletal:   [] Joint swelling   [] Joint pain   [] Low back pain  []  History of Knee Replacement [] Arthritis [] back Surgeries  []  Spinal Stenosis    Hematologic:  [] Easy bruising  [] Easy bleeding   [] Hypercoagulable state   [x] Anemic Gastrointestinal:  [] Diarrhea   [] Vomiting  [] Gastroesophageal reflux/heartburn   [] Difficulty swallowing. [] Abdominal pain Genitourinary:  [x] Chronic kidney disease   [] Difficult urination  [] Anuric   [] Blood in urine [] Frequent urination  [] Burning with urination   [] Hematuria Skin:  [] Rashes   [] Ulcers [] Wounds Psychological:  [] History of anxiety   []  History of major depression  []  Memory Difficulties      OBJECTIVE:   Physical Exam  BP 126/72 (BP Location: Left Arm)   Pulse 85   Resp 16   Wt 222 lb (100.7 kg)   BMI 46.40 kg/m   Gen: WD/WN, NAD Head: [place]/AT, No temporalis wasting.  Ear/Nose/Throat: Hearing grossly intact, nares w/o erythema or drainage Eyes: PER, EOMI, sclera nonicteric.  Neck: Supple, no masses.  No JVD.  Pulmonary:  Good air  movement, no use of accessory muscles.  Cardiac: RRR Vascular: good thrill and bruit  Vessel Right Left  Radial Palpable Palpable   Gastrointestinal: soft, non-distended. No guarding/no peritoneal signs.  Musculoskeletal: M/S 5/5 throughout.  Bilateral amputee  Neurologic: Pain and light touch intact in extremities.  Symmetrical.  Speech is fluent. Motor exam as listed above. Psychiatric: Judgment intact, Mood & affect appropriate for pt's clinical situation.       ASSESSMENT AND PLAN:  1. ESRD on dialysis [facility]) Recommend:  The patient is doing well and currently has adequate dialysis access. The patient's dialysis center is not reporting any access issues. Flow pattern is stable when compared to the prior ultrasound.  The patient should have a duplex ultrasound of the dialysis access in 6 months. The patient will follow-up with me in the office after each ultrasound     2. Hyperlipidemia, unspecified hyperlipidemia type Continue statin as ordered and reviewed, no changes at this time   3. Type 2 diabetes mellitus with chronic kidney disease on chronic dialysis, with long-term current use of insulin (HCC) Continue hypoglycemic medications as already ordered, these medications have been reviewed and there are no changes at this time.  Hgb A1C to be monitored as already arranged by primary service    Current Outpatient Medications on File Prior to Visit  Medication Sig Dispense Refill  . acetaminophen (TYLENOL) 500 MG tablet Take 1,000 mg by mouth every 8 (eight) hours as needed for moderate pain.    Marland Kitchen aspirin 81 MG tablet Take 81 mg by mouth daily.    Marland Kitchen atorvastatin (LIPITOR) 40 MG tablet Take 40 mg by mouth daily.    . calcitRIOL (ROCALTROL) 0.25 MCG capsule Take 0.25 mcg by mouth daily. Take 3 capsules by mouth three times week at dialysis    . camphor-menthol (SARNA) lotion Apply 1 application topically as needed for itching. Twice a day if needed    . Cholecalciferol  (VITAMIN D3) 2000 units TABS Take 2,000 Units by mouth daily.    . cinacalcet (SENSIPAR) 30 MG tablet Take 30 mg by mouth daily with supper.     . Dextromethorphan-guaiFENesin (MUCINEX DM) 30-600 MG TB12 Take by mouth.    . ferrous sulfate 325 (65 FE) MG tablet Take 325 mg by mouth every other day.    . fluticasone (FLONASE) 50 MCG/ACT nasal spray Place into both nostrils daily.    Marland Kitchen  glucose 4 GM chewable tablet Chew 1 tablet by mouth as needed for low blood sugar. If blood sugar less than 70. Chew 3 tablets and recheck blood sugar in 15 minutes    . insulin aspart (NOVOLOG) 100 UNIT/ML injection Inject 1 Units into the skin See admin instructions. Pt uses insulin PRN for Blood Sugar greater then 200 uses 1 unit if greater then 200     . lanthanum (FOSRENOL) 1000 MG chewable tablet Chew 1,000 mg by mouth 2 (two) times daily with a meal.     . liver oil-zinc oxide (DESITIN) 40 % ointment Apply 1 application topically as needed for irritation.    Marland Kitchen loperamide (IMODIUM A-D) 2 MG tablet Take 2 mg by mouth 4 (four) times daily as needed for diarrhea or loose stools. 2 tablets by mouth for onset of loose stool, than 1 tablet by mouth after each loose stool MAX 4 tablets in 24 hours    . loratadine (CLARITIN) 10 MG tablet Take 10 mg by mouth daily as needed for allergies.    Marland Kitchen nystatin (MYCOSTATIN) powder Apply 100,000 g topically 2 (two) times daily as needed. 100.000unit/gm     . Polyethyl Glycol-Propyl Glycol (SYSTANE) 0.4-0.3 % SOLN Place 1 drop into both eyes 4 (four) times daily as needed.     . polyethylene glycol (MIRALAX / GLYCOLAX) packet Take 17 g by mouth daily as needed for moderate constipation or severe constipation.    Marland Kitchen rOPINIRole (REQUIP) 0.25 MG tablet Take 0.25 mg by mouth at bedtime as needed.    . senna-docusate (SENOKOT-S) 8.6-50 MG tablet Take 1-2 tablets by mouth at bedtime as needed for mild constipation or moderate constipation. 1-2 tablets at bedtime if needed     . Skin  Protectants, Misc. (EUCERIN) cream Apply 1 application topically 4 (four) times daily as needed for dry skin.      No current facility-administered medications on file prior to visit.    There are no Patient Instructions on file for this visit. No follow-ups on file.   Kris Hartmann, NP  This note was completed with Sales executive.  Any errors are purely unintentional.

## 2019-11-19 ENCOUNTER — Emergency Department
Admission: EM | Admit: 2019-11-19 | Discharge: 2019-11-19 | Disposition: A | Discharge status: Home / Self Care | Payer: Medicare (Managed Care) | Attending: Emergency Medicine | Admitting: Emergency Medicine

## 2019-11-19 ENCOUNTER — Other Ambulatory Visit (INDEPENDENT_AMBULATORY_CARE_PROVIDER_SITE_OTHER): Payer: Self-pay | Admitting: Vascular Surgery

## 2019-11-19 ENCOUNTER — Telehealth (INDEPENDENT_AMBULATORY_CARE_PROVIDER_SITE_OTHER): Payer: Self-pay

## 2019-11-19 ENCOUNTER — Other Ambulatory Visit: Payer: Self-pay

## 2019-11-19 DIAGNOSIS — Z79899 Other long term (current) drug therapy: Secondary | ICD-10-CM | POA: Insufficient documentation

## 2019-11-19 DIAGNOSIS — Z20822 Contact with and (suspected) exposure to covid-19: Secondary | ICD-10-CM | POA: Insufficient documentation

## 2019-11-19 DIAGNOSIS — E1122 Type 2 diabetes mellitus with diabetic chronic kidney disease: Secondary | ICD-10-CM | POA: Diagnosis not present

## 2019-11-19 DIAGNOSIS — Z7982 Long term (current) use of aspirin: Secondary | ICD-10-CM | POA: Insufficient documentation

## 2019-11-19 DIAGNOSIS — N186 End stage renal disease: Secondary | ICD-10-CM | POA: Diagnosis not present

## 2019-11-19 DIAGNOSIS — I120 Hypertensive chronic kidney disease with stage 5 chronic kidney disease or end stage renal disease: Secondary | ICD-10-CM | POA: Diagnosis not present

## 2019-11-19 DIAGNOSIS — T82838A Hemorrhage of vascular prosthetic devices, implants and grafts, initial encounter: Secondary | ICD-10-CM | POA: Diagnosis not present

## 2019-11-19 DIAGNOSIS — Z992 Dependence on renal dialysis: Secondary | ICD-10-CM | POA: Diagnosis not present

## 2019-11-19 DIAGNOSIS — Z794 Long term (current) use of insulin: Secondary | ICD-10-CM | POA: Diagnosis not present

## 2019-11-19 DIAGNOSIS — R58 Hemorrhage, not elsewhere classified: Secondary | ICD-10-CM | POA: Diagnosis present

## 2019-11-19 LAB — BASIC METABOLIC PANEL
Anion gap: 15 (ref 5–15)
BUN: 21 mg/dL (ref 8–23)
CO2: 31 mmol/L (ref 22–32)
Calcium: 8.2 mg/dL — ABNORMAL LOW (ref 8.9–10.3)
Chloride: 93 mmol/L — ABNORMAL LOW (ref 98–111)
Creatinine, Ser: 5.11 mg/dL — ABNORMAL HIGH (ref 0.44–1.00)
GFR calc Af Amer: 9 mL/min — ABNORMAL LOW (ref >60)
GFR calc non Af Amer: 8 mL/min — ABNORMAL LOW (ref >60)
Glucose, Bld: 142 mg/dL — ABNORMAL HIGH (ref 70–99)
Potassium: 3.3 mmol/L — ABNORMAL LOW (ref 3.5–5.1)
Sodium: 139 mmol/L (ref 135–145)

## 2019-11-19 LAB — CBC
HCT: 32 % — ABNORMAL LOW (ref 36.0–46.0)
Hemoglobin: 10.7 g/dL — ABNORMAL LOW (ref 12.0–15.0)
MCH: 25.9 pg — ABNORMAL LOW (ref 26.0–34.0)
MCHC: 33.4 g/dL (ref 30.0–36.0)
MCV: 77.5 fL — ABNORMAL LOW (ref 80.0–100.0)
Platelets: 202 10*3/uL (ref 150–400)
RBC: 4.13 MIL/uL (ref 3.87–5.11)
RDW: 16.8 % — ABNORMAL HIGH (ref 11.5–15.5)
WBC: 6.8 10*3/uL (ref 4.0–10.5)
nRBC: 0 % (ref 0.0–0.2)

## 2019-11-19 LAB — APTT: aPTT: 34 seconds (ref 24–36)

## 2019-11-19 LAB — TYPE AND SCREEN
ABO/RH(D): O POS
Antibody Screen: NEGATIVE

## 2019-11-19 LAB — SARS CORONAVIRUS 2 BY RT PCR (HOSPITAL ORDER, PERFORMED IN ~~LOC~~ HOSPITAL LAB): SARS Coronavirus 2: NEGATIVE

## 2019-11-19 LAB — PROTIME-INR
INR: 1 (ref 0.8–1.2)
Prothrombin Time: 12.8 seconds (ref 11.4–15.2)

## 2019-11-19 MED ORDER — TRANEXAMIC ACID FOR EPISTAXIS
500.0000 mg | TOPICAL | Status: DC
  Filled 2019-11-19: qty 10

## 2019-11-19 NOTE — ED Provider Notes (Signed)
[facility] Emergency Department Provider Note   ____________________________________________   First MD Initiated Contact with Patient 11/19/19 [phone]     (approximate)  I have reviewed the triage vital signs and the nursing notes.   HISTORY  Chief Complaint Bleeding  EM caveat some limitation due to acute arterial hemorrhage on arrival, life-threatening bleeding  HPI Amy Wu is a 69 y.o. female here for evaluation for bleeding after dialysis.  Her left upper thigh shunt was bleeding and spurting blood after they removed her dialysis needle after dialysis.  They attempted to stop this for about an hour of the dialysis center but were unsuccessful.  Patient denies any pain.  No numbness or weakness.  She reports that she has not had issue like this before.  She sees vascular surgery Dr. Lucky Cowboy.   No recent illness.  Denies Covid-like symptoms.  Completed her full dialysis session today.  Does not take any blood thinners other than aspirin    Past Medical History:  Diagnosis Date  . Arthritis   . Chronic kidney disease   . Diabetes mellitus without complication ([place])   . Hyperlipidemia   . Hypertension   . Peripheral vascular disease [facility])     Patient Active Problem List   Diagnosis Date Noted  . Diabetes ([place]) 06/20/2016  . Hyperlipidemia 06/20/2016  . ESRD on dialysis ([place]) 06/20/2016  . Restless leg syndrome, uncontrolled 06/20/2016    Past Surgical History:  Procedure Laterality Date  . A/V SHUNTOGRAM Left 02/16/2017   Procedure: A/V Shuntogram;  Surgeon: Algernon Huxley, MD;  Location: [place] [facility];  Service: Cardiovascular;  Laterality: Left;  . A/V SHUNTOGRAM Left 12/13/2017   Procedure: A/V SHUNTOGRAM;  Surgeon: Algernon Huxley, MD;  Location: [place] [facility];  Service: Cardiovascular;  Laterality: Left;  . A/V SHUNTOGRAM Left 12/05/2018   Procedure: A/V SHUNTOGRAM;  Surgeon: Algernon Huxley, MD;  Location: [place] [facility];   Service: Cardiovascular;  Laterality: Left;  . ABOVE KNEE LEG AMPUTATION    . AV FISTULA PLACEMENT Bilateral   . AV FISTULA PLACEMENT Left 07/01/2015   Procedure: INSERTION OF ARTERIOVENOUS (AV) GRAFT THIGH (ARTEGRAFT);  Surgeon: Algernon Huxley, MD;  Location: [facility];  Service: Vascular;  Laterality: Left;  . BELOW KNEE LEG AMPUTATION Left   . ENDARTERECTOMY FEMORAL Left 07/01/2015   Procedure: SUPERFICIAL FEMORAL ENDARTERECTOMY ;  Surgeon: Algernon Huxley, MD;  Location: [facility];  Service: Vascular;  Laterality: Left;  . EXCHANGE OF A DIALYSIS CATHETER  02/11/2015   Procedure: Exchange Of A Dialysis Catheter;  Surgeon: Algernon Huxley, MD;  Location: [place] [facility];  Service: Cardiovascular;;  . LEG AMPUTATION ABOVE KNEE Right   . PERIPHERAL VASCULAR CATHETERIZATION N/A 02/11/2015   Procedure: Dialysis/Perma Catheter Insertion;  Surgeon: Algernon Huxley, MD;  Location: [place] [facility];  Service: Cardiovascular;  Laterality: N/A;  . PERIPHERAL VASCULAR CATHETERIZATION N/A 05/11/2015   Procedure: Dialysis/Perma Catheter Insertion, exchange;  Surgeon: Katha Cabal, MD;  Location: [place] [facility];  Service: Cardiovascular;  Laterality: N/A;  . PERIPHERAL VASCULAR CATHETERIZATION N/A 05/18/2015   Procedure: Dialysis/Perma Catheter Insertion;  Surgeon: Katha Cabal, MD;  Location: [place] [facility];  Service: Cardiovascular;  Laterality: N/A;  . PERIPHERAL VASCULAR CATHETERIZATION Left 07/19/2015   Procedure: A/V Shuntogram/Fistulagram;  Surgeon: Algernon Huxley, MD;  Location: [place] [facility];  Service: Cardiovascular;  Laterality: Left;  . PERIPHERAL VASCULAR CATHETERIZATION N/A 07/19/2015  Procedure: A/V Shunt Intervention;  Surgeon: Algernon Huxley, MD;  Location: [place] [facility];  Service: Cardiovascular;  Laterality: N/A;  . PERIPHERAL VASCULAR CATHETERIZATION N/A 08/26/2015   Procedure: Dialysis/Perma Catheter Removal;  Surgeon: Algernon Huxley, MD;  Location: [place] [facility];   Service: Cardiovascular;  Laterality: N/A;  . PERIPHERAL VASCULAR THROMBECTOMY N/A 12/29/2016   Procedure: Peripheral Vascular Thrombectomy;  Surgeon: Katha Cabal, MD;  Location: [place] [facility];  Service: Cardiovascular;  Laterality: N/A;    Prior to Admission medications   Medication Sig Start Date End Date Taking? Authorizing Provider  acetaminophen (TYLENOL) 500 MG tablet Take 1,000 mg by mouth every 8 (eight) hours as needed for moderate pain.    [provider]  aspirin 81 MG tablet Take 81 mg by mouth daily.    [provider]  atorvastatin (LIPITOR) 40 MG tablet Take 40 mg by mouth daily.    [provider]  calcitRIOL (ROCALTROL) 0.25 MCG capsule Take 0.25 mcg by mouth daily. Take 3 capsules by mouth three times week at dialysis    [provider]  camphor-menthol Timoteo Ace) lotion Apply 1 application topically as needed for itching. Twice a day if needed    [provider]  Cholecalciferol (VITAMIN D3) 2000 units TABS Take 2,000 Units by mouth daily.    [provider]  cinacalcet (SENSIPAR) 30 MG tablet Take 30 mg by mouth daily with supper.     [provider]  Dextromethorphan-guaiFENesin [facility] DM) 30-600 MG TB12 Take by mouth. 04/30/17   [provider]  ferrous sulfate 325 (65 FE) MG tablet Take 325 mg by mouth every other day.    [provider]  fluticasone (FLONASE) 50 MCG/ACT nasal spray Place into both nostrils daily.    [provider]  glucose 4 GM chewable tablet Chew 1 tablet by mouth as needed for low blood sugar. If blood sugar less than 70. Chew 3 tablets and recheck blood sugar in 15 minutes    [provider]  insulin aspart (NOVOLOG) 100 UNIT/ML injection Inject 1 Units into the skin See admin instructions. Pt uses insulin PRN for Blood Sugar greater then 200 uses 1 unit if greater then 200     [provider]  lanthanum (FOSRENOL) 1000 MG chewable  tablet Chew 1,000 mg by mouth 2 (two) times daily with a meal.     [provider]  liver oil-zinc oxide (DESITIN) 40 % ointment Apply 1 application topically as needed for irritation.    [provider]  loperamide (IMODIUM A-D) 2 MG tablet Take 2 mg by mouth 4 (four) times daily as needed for diarrhea or loose stools. 2 tablets by mouth for onset of loose stool, than 1 tablet by mouth after each loose stool MAX 4 tablets in 24 hours    [provider]  loratadine (CLARITIN) 10 MG tablet Take 10 mg by mouth daily as needed for allergies.    [provider]  nystatin (MYCOSTATIN) powder Apply 100,000 g topically 2 (two) times daily as needed. 100.000unit/gm     [provider]  Polyethyl Glycol-Propyl Glycol (SYSTANE) 0.4-0.3 % SOLN Place 1 drop into both eyes 4 (four) times daily as needed.     [provider]  polyethylene glycol (MIRALAX / GLYCOLAX) packet Take 17 g by mouth daily as needed for moderate constipation or severe constipation.    [provider]  rOPINIRole (REQUIP) 0.25 MG tablet Take 0.25 mg by  mouth at bedtime as needed.    [provider]  senna-docusate (SENOKOT-S) 8.6-50 MG tablet Take 1-2 tablets by mouth at bedtime as needed for mild constipation or moderate constipation. 1-2 tablets at bedtime if needed     [provider]  Skin Protectants, Misc. (EUCERIN) cream Apply 1 application topically 4 (four) times daily as needed for dry skin.     [provider]    Allergies Ancef [cefazolin] and Contrast media [iodinated diagnostic agents]  Family History  Problem Relation Age of Onset  . Breast cancer Neg Hx     Social History Social History   Tobacco Use  . Smoking status: Never Smoker  . Smokeless tobacco: Never Used  Substance Use Topics  . Alcohol use: No  . Drug use: No    Review of Systems Constitutional: No fever/chills no known Covid exposure Space Cardiovascular:  Denies chest pain. Respiratory: Denies shortness of breath. Gastrointestinal: No abdominal pain.   Musculoskeletal: Negative for back pain. Skin: Negative for rash.  See HPI regarding bleeding from left dialysis access Neurological: Negative for headaches, areas of focal weakness or numbness.    ____________________________________________   PHYSICAL EXAM:  VITAL SIGNS: ED Triage Vitals  Enc Vitals Group     BP 11/19/19 0947 (!) 154/134     Pulse Rate 11/19/19 0947 80     Resp 11/19/19 0947 18     Temp 11/19/19 0950 97.9 F (36.6 C)     Temp Source 11/19/19 0947 Oral     SpO2 11/19/19 0947 100 %     Weight 11/19/19 0948 225 lb (102.1 kg)     Height 11/19/19 0948 4\' 10"  (1.473 m)     Head Circumference --      Peak Flow --      Pain Score 11/19/19 0948 0     Pain Loc --      Pain Edu? --      Excl. in [place]? --     Constitutional: Alert and oriented. Well appearing and in no acute distress. Eyes: Conjunctivae are normal. Head: Atraumatic. Nose: No congestion/rhinnorhea. Mouth/Throat: Mucous membranes are moist. Neck: No stridor.  Cardiovascular: Normal rate, regular rhythm. Grossly normal heart sounds.  Left lower leg stump site appears well perfused Respiratory: Normal respiratory effort.  No retractions. Lungs CTAB. Gastrointestinal: Soft and nontender. No distention. Musculoskeletal: No lower extremity tenderness nor edema.  Left lower extremity amputation.  Patient noted to have pulsatile spurting very thin stream of acute arterial bleeding from left femoral groin site upon removal of pressure by EMS as it was bleeding through bandages. Neurologic:  Normal speech and language. No gross focal neurologic deficits are appreciated.  Skin:  Skin is warm, dry and intact. No rash noted. Psychiatric: Mood and affect are normal. Speech and behavior are normal.  ____________________________________________   LABS (all labs ordered are listed, but only abnormal results are  displayed)  Labs Reviewed  CBC - Abnormal; Notable for the following components:      Result Value   Hemoglobin 10.7 (*)    HCT 32.0 (*)    MCV 77.5 (*)    MCH 25.9 (*)    RDW 16.8 (*)    All other components within normal limits  BASIC METABOLIC PANEL - Abnormal; Notable for the following components:   Potassium 3.3 (*)    Chloride 93 (*)    Glucose, Bld 142 (*)    Creatinine, Ser 5.11 (*)    Calcium 8.2 (*)  GFR calc non Af Amer 8 (*)    GFR calc Af Amer 9 (*)    All other components within normal limits  SARS CORONAVIRUS 2 BY RT PCR (HOSPITAL ORDER, [place] [facility])  PROTIME-INR  APTT  TYPE AND SCREEN   ____________________________________________  EKG   ____________________________________________  RADIOLOGY   ____________________________________________   PROCEDURES  Procedure(s) performed: Hemostasis control   Patient required hemostasis control including direct pressure and placement of Surgicel overlying her left dialysis point where there was a small pin-like region with arterial vascular arterial blood spurting.  Control was obtained with placement of Surgicel, soaked with TXA, and quick clot bandage.  After 30 minutes of direct pressure no further bleeding this was removed and continued to be observed.  Managed by myself and nursing  No evidence of ongoing bleeding at this time  Procedures  Critical Care performed: Yes, see critical care note(s)  CRITICAL CARE Performed by: Delman Kitten   Total critical care time: 25 minutes  Critical care time was exclusive of separately billable procedures and treating other patients.  Critical care was necessary to treat or prevent imminent or life-threatening deterioration.  Critical care was time spent personally by me on the following activities: development of treatment plan with patient and/or surrogate as well as nursing, discussions with consultants, evaluation of patient's  response to treatment, examination of patient, obtaining history from patient or surrogate, ordering and performing treatments and interventions, ordering and review of laboratory studies, ordering and review of radiographic studies, pulse oximetry and re-evaluation of patient's condition.  ____________________________________________   INITIAL IMPRESSION / ASSESSMENT AND PLAN / ED COURSE  Pertinent labs & imaging results that were available during my care of the patient were reviewed by me and considered in my medical decision making (see chart for details).   Patient presents with acute arterial bleeding from her left femoral vascular access site  Clinical Course as of Nov 19 1455  Wed Nov 19, 2019  1117 Continue to observe carefully, no further evidence of bleeding.  Patient resting comfortably.  She has had hematoma stasis for about an hour and 15 minutes at this time.  Discussed with Dr. Lazaro Arms and he advises if no further bleeding may discharge and has set her up for a procedure tomorrow at 11 AM to evaluate.  Patient and her husband are both comfortable with this plan and we discussed careful return and bleeding precautions if she has any further bleeding from the site she is to return immediately.  Patient agreeable, will continue to observe for approximately additional 45 minutes for any evidence of ongoing bleeding.   [MQ]  1423 No ongoing bleeding.  Wound dressing site clean.  Patient reports he feels well.  Comfortable with plan to discharge if any ongoing bleeding or recurrence of bleeding should return the emergency room right away.  Otherwise knows plan tomorrow if to plan for procedure and nothing to eat after midnight.  Patient and husband both agreeable.   [MQ]    Clinical Course User Index [MQ] Delman Kitten, MD   ----------------------------------------- 2:56 PM on 11/19/2019 -----------------------------------------  Have discussed with vascular surgery, they have arranged for  the patient to return tomorrow at 11 AM for procedure.  Patient and her husband both understanding comfortable plan with very careful return precautions should she see any further concerns of bleeding from her dialysis site she will turn right away.  Return precautions and treatment recommendations and follow-up discussed with the patient who  is agreeable with the plan.    ____________________________________________   FINAL CLINICAL IMPRESSION(S) / ED DIAGNOSES  Final diagnoses:  Bleeding from dialysis shunt, initial encounter [facility])        Note:  This document was prepared using Dragon voice recognition software and may include unintentional dictation errors       Delman Kitten, MD 11/19/19 1457

## 2019-11-19 NOTE — ED Notes (Signed)
Md Quale at bedside to update on dispo plan. Pt given meal tray and water to drink at this time. Pt husband is going to take pt home via wheelchair van.

## 2019-11-19 NOTE — ED Notes (Signed)
Bleeding controlled at this time with pressure bandage in place.

## 2019-11-19 NOTE — Discharge Instructions (Addendum)
Return to [facility] and check in for you procedure at 11 AM tomorrow with Dr. Lucky Cowboy  Vascular Surgery Discharge Instructions  1) Please arrive by 11am tomorrow morning for your procedure. 2) No eating or drinking after 12 midnight today. 3) Do not remove the pressure dressing placed by the emergency department.

## 2019-11-19 NOTE — Telephone Encounter (Signed)
Misty from [facility] called stating the patient is bleeding after her treatment and wanted the patient seen in office today. I asked how bad is she bleeding and was told that she is bleeding heavy and they can't stop it at all and have tried multiple ways to stop the bleeding. Per Eulogio Ditch NP the patient needs to go to the ED for evaluation because we have no way to stop bleeding in our office other than applying pressure.

## 2019-11-19 NOTE — ED Triage Notes (Addendum)
Pt comes into the ED via EMS from dialysis, states after she finished her treatment today the left femoral graft was bleeding heavily and were not able to get it to stop. Direct pressure applied to the needle site and bleeding controlled at this time.

## 2019-11-19 NOTE — ED Notes (Signed)
E-signature not working at this time. Pt verbalized understanding of D/C instructions, prescriptions and follow up care with no further questions at this time. Pt in NAD and using electric wheelchair to go home with family. Bleeding still controlled at site at this time. Pt instructed to come back in am for appointment.

## 2019-11-20 ENCOUNTER — Other Ambulatory Visit: Payer: Self-pay

## 2019-11-20 ENCOUNTER — Encounter
Admission: RE | Disposition: A | Discharge status: Home / Self Care | Payer: Self-pay | Source: Home / Self Care | Attending: Vascular Surgery

## 2019-11-20 ENCOUNTER — Ambulatory Visit
Admission: RE | Admit: 2019-11-20 | Discharge: 2019-11-20 | Disposition: A | Discharge status: Home / Self Care | Payer: Medicare (Managed Care) | Attending: Vascular Surgery | Admitting: Vascular Surgery

## 2019-11-20 ENCOUNTER — Encounter: Payer: Self-pay | Admitting: Vascular Surgery

## 2019-11-20 DIAGNOSIS — I120 Hypertensive chronic kidney disease with stage 5 chronic kidney disease or end stage renal disease: Secondary | ICD-10-CM | POA: Diagnosis not present

## 2019-11-20 DIAGNOSIS — N186 End stage renal disease: Secondary | ICD-10-CM

## 2019-11-20 DIAGNOSIS — Z992 Dependence on renal dialysis: Secondary | ICD-10-CM

## 2019-11-20 DIAGNOSIS — E1151 Type 2 diabetes mellitus with diabetic peripheral angiopathy without gangrene: Secondary | ICD-10-CM | POA: Insufficient documentation

## 2019-11-20 DIAGNOSIS — Y841 Kidney dialysis as the cause of abnormal reaction of the patient, or of later complication, without mention of misadventure at the time of the procedure: Secondary | ICD-10-CM | POA: Diagnosis not present

## 2019-11-20 DIAGNOSIS — T82510A Breakdown (mechanical) of surgically created arteriovenous fistula, initial encounter: Secondary | ICD-10-CM | POA: Insufficient documentation

## 2019-11-20 DIAGNOSIS — Z881 Allergy status to other antibiotic agents status: Secondary | ICD-10-CM | POA: Insufficient documentation

## 2019-11-20 DIAGNOSIS — E1122 Type 2 diabetes mellitus with diabetic chronic kidney disease: Secondary | ICD-10-CM | POA: Insufficient documentation

## 2019-11-20 DIAGNOSIS — E785 Hyperlipidemia, unspecified: Secondary | ICD-10-CM | POA: Insufficient documentation

## 2019-11-20 DIAGNOSIS — T82898A Other specified complication of vascular prosthetic devices, implants and grafts, initial encounter: Secondary | ICD-10-CM

## 2019-11-20 HISTORY — PX: A/V SHUNT INTERVENTION: CATH118220

## 2019-11-20 LAB — POTASSIUM (ARMC VASCULAR LAB ONLY): Potassium (ARMC vascular lab): 4.5 (ref 3.5–5.1)

## 2019-11-20 LAB — GLUCOSE, CAPILLARY: Glucose-Capillary: 145 mg/dL — ABNORMAL HIGH (ref 70–99)

## 2019-11-20 SURGERY — A/V SHUNT INTERVENTION
Anesthesia: Moderate Sedation | Laterality: Left

## 2019-11-20 MED ORDER — FENTANYL CITRATE (PF) 100 MCG/2ML IJ SOLN
INTRAMUSCULAR | Status: AC
  Filled 2019-11-20: qty 2

## 2019-11-20 MED ORDER — MIDAZOLAM HCL 2 MG/ML PO SYRP: 8.0000 mg | ORAL_SOLUTION | Freq: Once | ORAL | Status: DC | PRN

## 2019-11-20 MED ORDER — METHYLPREDNISOLONE SODIUM SUCC 125 MG IJ SOLR
125.0000 mg | Freq: Once | INTRAMUSCULAR | Status: AC | PRN
  Administered 2019-11-20: 125 mg via INTRAVENOUS

## 2019-11-20 MED ORDER — DIPHENHYDRAMINE HCL 50 MG/ML IJ SOLN
INTRAMUSCULAR | Status: AC
  Filled 2019-11-20: qty 1

## 2019-11-20 MED ORDER — MIDAZOLAM HCL 2 MG/2ML IJ SOLN
INTRAMUSCULAR | Status: DC | PRN
  Administered 2019-11-20: 2 mg via INTRAVENOUS

## 2019-11-20 MED ORDER — HEPARIN SODIUM (PORCINE) 1000 UNIT/ML IJ SOLN
INTRAMUSCULAR | Status: DC | PRN
  Administered 2019-11-20: 3000 [IU] via INTRAVENOUS

## 2019-11-20 MED ORDER — DIPHENHYDRAMINE HCL 50 MG/ML IJ SOLN
50.0000 mg | Freq: Once | INTRAMUSCULAR | Status: AC | PRN
  Administered 2019-11-20: 50 mg via INTRAVENOUS

## 2019-11-20 MED ORDER — CLINDAMYCIN PHOSPHATE 300 MG/50ML IV SOLN
INTRAVENOUS | Status: AC
  Filled 2019-11-20: qty 50

## 2019-11-20 MED ORDER — FENTANYL CITRATE (PF) 100 MCG/2ML IJ SOLN
INTRAMUSCULAR | Status: DC | PRN
  Administered 2019-11-20: 50 ug via INTRAVENOUS

## 2019-11-20 MED ORDER — CLINDAMYCIN PHOSPHATE 300 MG/50ML IV SOLN
300.0000 mg | Freq: Once | INTRAVENOUS | Status: AC
  Administered 2019-11-20: 300 mg via INTRAVENOUS

## 2019-11-20 MED ORDER — ONDANSETRON HCL 4 MG/2ML IJ SOLN: 4.0000 mg | Freq: Four times a day (QID) | INTRAMUSCULAR | Status: DC | PRN

## 2019-11-20 MED ORDER — FAMOTIDINE 20 MG PO TABS
40.0000 mg | ORAL_TABLET | Freq: Once | ORAL | Status: AC | PRN
  Administered 2019-11-20: 40 mg via ORAL

## 2019-11-20 MED ORDER — METHYLPREDNISOLONE SODIUM SUCC 125 MG IJ SOLR
INTRAMUSCULAR | Status: AC
  Filled 2019-11-20: qty 2

## 2019-11-20 MED ORDER — HYDROMORPHONE HCL 1 MG/ML IJ SOLN: 1.0000 mg | Freq: Once | INTRAMUSCULAR | Status: DC | PRN

## 2019-11-20 MED ORDER — SODIUM CHLORIDE 0.9 % IV SOLN: INTRAVENOUS | Status: DC

## 2019-11-20 MED ORDER — MIDAZOLAM HCL 5 MG/5ML IJ SOLN
INTRAMUSCULAR | Status: AC
  Filled 2019-11-20: qty 5

## 2019-11-20 MED ORDER — HEPARIN SODIUM (PORCINE) 1000 UNIT/ML IJ SOLN
INTRAMUSCULAR | Status: AC
  Filled 2019-11-20: qty 1

## 2019-11-20 MED ORDER — FAMOTIDINE 20 MG PO TABS
ORAL_TABLET | ORAL | Status: AC
  Filled 2019-11-20: qty 2

## 2019-11-20 SURGICAL SUPPLY — 13 items
BALLN ULTRVRSE 7X150X75 (BALLOONS) ×3
BALLOON ULTRVRSE 7X150X75 (BALLOONS) IMPLANT
CANNULA 5F STIFF (CANNULA) ×2 IMPLANT
CATH BEACON 5 .035 40 KMP TP (CATHETERS) IMPLANT
CATH BEACON 5 .038 40 KMP TP (CATHETERS) ×2
DEVICE PRESTO INFLATION (MISCELLANEOUS) ×5 IMPLANT
DRAPE BRACHIAL (DRAPES) ×2 IMPLANT
PACK ANGIOGRAPHY (CUSTOM PROCEDURE TRAY) ×2 IMPLANT
SHEATH BRITE TIP 7FRX5.5 (SHEATH) ×2 IMPLANT
STENT VIABAHN 8X150X120 (Permanent Stent) ×2 IMPLANT
STENT VIABAHN 8X15X120 7FR (Permanent Stent) IMPLANT
SUT MNCRL AB 4-0 PS2 18 (SUTURE) ×2 IMPLANT
WIRE G 018X200 V18 (WIRE) ×2 IMPLANT

## 2019-11-20 NOTE — H&P (Addendum)
[facility] Admission History & Physical  MRN : [id number]  Amy Wu is a 69 y.o. (1950-09-07) female who presents with chief complaint of prolonged bleeding after dialysis.  History of Present Illness:  I am asked to evaluate the patient by the emergency department .  The patient was sent to the emergency room yesterday by her dialysis center due to significant bleeding after dialysis.  In the emergency room, a pressure dressing was applied which created hemostasis.  The patient was then placed on our schedule to undergo a fistulogram today.  Patient denies pain or tenderness overlying the access.  There is no pain with dialysis.  The patient denies hand pain or finger pain consistent with steal syndrome.  Denies any fever, nausea vomiting.  Denies any chest pain or shortness of breath.  Current Facility-Administered Medications  Medication Dose Route Frequency Provider Last Rate Last Admin  . 0.9 %  sodium chloride infusion   Intravenous Continuous Thurlow Gallaga A, PA-C 10 mL/hr at 11/20/19 1110 New Bag at 11/20/19 1110  . clindamycin (CLEOCIN) 300 MG/50ML IVPB           . clindamycin (CLEOCIN) IVPB 300 mg  300 mg Intravenous Once Angeli Demilio A, PA-C      . diphenhydrAMINE (BENADRYL) injection 50 mg  50 mg Intravenous Once PRN Rolena Knutson A, PA-C      . famotidine (PEPCID) tablet 40 mg  40 mg Oral Once PRN Chala Gul, Janalyn Harder, PA-C      . HYDROmorphone (DILAUDID) injection 1 mg  1 mg Intravenous Once PRN Oleta Gunnoe A, PA-C      . methylPREDNISolone sodium succinate (SOLU-MEDROL) 125 mg/2 mL injection 125 mg  125 mg Intravenous Once PRN Chistine Dematteo A, PA-C      . midazolam (VERSED) 2 MG/ML syrup 8 mg  8 mg Oral Once PRN Adrianne Shackleton A, PA-C      . ondansetron (ZOFRAN) injection 4 mg  4 mg Intravenous Q6H PRN Kaisyn Millea, Janalyn Harder, PA-C       Past Medical History:  Diagnosis Date  . Arthritis   . Chronic  kidney disease   . Diabetes mellitus without complication ([place])   . Hyperlipidemia   . Hypertension   . Peripheral vascular disease [facility])    Past Surgical History:  Procedure Laterality Date  . A/V SHUNTOGRAM Left 02/16/2017   Procedure: A/V Shuntogram;  Surgeon: Algernon Huxley, MD;  Location: [place] [facility];  Service: Cardiovascular;  Laterality: Left;  . A/V SHUNTOGRAM Left 12/13/2017   Procedure: A/V SHUNTOGRAM;  Surgeon: Algernon Huxley, MD;  Location: [place] [facility];  Service: Cardiovascular;  Laterality: Left;  . A/V SHUNTOGRAM Left 12/05/2018   Procedure: A/V SHUNTOGRAM;  Surgeon: Algernon Huxley, MD;  Location: [place] [facility];  Service: Cardiovascular;  Laterality: Left;  . ABOVE KNEE LEG AMPUTATION    . AV FISTULA PLACEMENT Bilateral   . AV FISTULA PLACEMENT Left 07/01/2015   Procedure: INSERTION OF ARTERIOVENOUS (AV) GRAFT THIGH (ARTEGRAFT);  Surgeon: Algernon Huxley, MD;  Location: [facility];  Service: Vascular;  Laterality: Left;  . BELOW KNEE LEG AMPUTATION Left   . ENDARTERECTOMY FEMORAL Left 07/01/2015   Procedure: SUPERFICIAL FEMORAL ENDARTERECTOMY ;  Surgeon: Algernon Huxley, MD;  Location: [facility];  Service: Vascular;  Laterality: Left;  . EXCHANGE OF A DIALYSIS CATHETER  02/11/2015   Procedure: Exchange Of A Dialysis Catheter;  Surgeon: Algernon Huxley, MD;  Location: [facility]  INVASIVE CV LAB;  Service: Cardiovascular;;  . LEG AMPUTATION ABOVE KNEE Right   . PERIPHERAL VASCULAR CATHETERIZATION N/A 02/11/2015   Procedure: Dialysis/Perma Catheter Insertion;  Surgeon: Algernon Huxley, MD;  Location: [place] [facility];  Service: Cardiovascular;  Laterality: N/A;  . PERIPHERAL VASCULAR CATHETERIZATION N/A 05/11/2015   Procedure: Dialysis/Perma Catheter Insertion, exchange;  Surgeon: Katha Cabal, MD;  Location: [place] [facility];  Service: Cardiovascular;  Laterality: N/A;  . PERIPHERAL VASCULAR CATHETERIZATION N/A 05/18/2015   Procedure: Dialysis/Perma Catheter Insertion;   Surgeon: Katha Cabal, MD;  Location: [place] [facility];  Service: Cardiovascular;  Laterality: N/A;  . PERIPHERAL VASCULAR CATHETERIZATION Left 07/19/2015   Procedure: A/V Shuntogram/Fistulagram;  Surgeon: Algernon Huxley, MD;  Location: [place] [facility];  Service: Cardiovascular;  Laterality: Left;  . PERIPHERAL VASCULAR CATHETERIZATION N/A 07/19/2015   Procedure: A/V Shunt Intervention;  Surgeon: Algernon Huxley, MD;  Location: [place] [facility];  Service: Cardiovascular;  Laterality: N/A;  . PERIPHERAL VASCULAR CATHETERIZATION N/A 08/26/2015   Procedure: Dialysis/Perma Catheter Removal;  Surgeon: Algernon Huxley, MD;  Location: [place] [facility];  Service: Cardiovascular;  Laterality: N/A;  . PERIPHERAL VASCULAR THROMBECTOMY N/A 12/29/2016   Procedure: Peripheral Vascular Thrombectomy;  Surgeon: Katha Cabal, MD;  Location: [place] [facility];  Service: Cardiovascular;  Laterality: N/A;   Social History Social History   Tobacco Use  . Smoking status: Never Smoker  . Smokeless tobacco: Never Used  Substance Use Topics  . Alcohol use: No  . Drug use: No   Family History Family History  Problem Relation Age of Onset  . Breast cancer Neg Hx   No family history of bleeding or clotting disorders, autoimmune disease or porphyria  Allergies  Allergen Reactions  . Ancef [Cefazolin] Palpitations  . Contrast Media [Iodinated Diagnostic Agents] Palpitations   REVIEW OF SYSTEMS (Negative unless checked)  Constitutional: [] Weight loss  [] Fever  [] Chills Cardiac: [] Chest pain   [] Chest pressure   [] Palpitations   [] Shortness of breath when laying flat   [] Shortness of breath at rest   [x] Shortness of breath with exertion. Vascular:  [] Pain in legs with walking   [] Pain in legs at rest   [] Pain in legs when laying flat   [] Claudication   [] Pain in feet when walking  [] Pain in feet at rest  [] Pain in feet when laying flat   [] History of DVT   [] Phlebitis   [] Swelling in legs    [] Varicose veins   [] Non-healing ulcers Pulmonary:   [] Uses home oxygen   [] Productive cough   [] Hemoptysis   [] Wheeze  [] COPD   [] Asthma Neurologic:  [] Dizziness  [] Blackouts   [] Seizures   [] History of stroke   [] History of TIA  [] Aphasia   [] Temporary blindness   [] Dysphagia   [] Weakness or numbness in arms   [] Weakness or numbness in legs Musculoskeletal:  [] Arthritis   [] Joint swelling   [] Joint pain   [] Low back pain Hematologic:  [] Easy bruising  [] Easy bleeding   [] Hypercoagulable state   [x] Anemic  [] Hepatitis Gastrointestinal:  [] Blood in stool   [] Vomiting blood  [] Gastroesophageal reflux/heartburn   [] Difficulty swallowing. Genitourinary:  [x] Chronic kidney disease   [] Difficult urination  [] Frequent urination  [] Burning with urination   [] Blood in urine Skin:  [] Rashes   [] Ulcers   [] Wounds Psychological:  [] History of anxiety   []  History of major depression.  Physical Examination  Vitals:   11/20/19 1059  BP: 136/65  Pulse: 71  Resp: 13  Temp: 98.2 F (36.8 C)  TempSrc: Oral  SpO2: 93%  Weight: 104.3 kg  Height: 4' 10.5" (1.486 m)   Body mass index is 47.25 kg/m. Gen: WD/WN, NAD Head: [place]/AT, No temporalis wasting. Prominent temp pulse not noted. Ear/Nose/Throat: Hearing grossly intact, nares w/o erythema or drainage, oropharynx w/o Erythema/Exudate,  Eyes: Conjunctiva clear, sclera non-icteric Neck: Trachea midline.  No JVD.  Pulmonary:  Good air movement, respirations not labored, no use of accessory muscles.  Cardiac: RRR, normal S1, S2. Vascular:  Vessel Right Left  Radial Palpable Palpable  Ulnar Not Palpable Not Palpable  Brachial Palpable Palpable  Carotid Palpable, without bruit Palpable, without bruit   Right thigh: Pressure dressing intact.  Good bruit and thrill.  Thigh soft.  Stump healthy.   Gastrointestinal: soft, non-tender/non-distended. No guarding/reflex.  Musculoskeletal: M/S 5/5 throughout.  Extremities without ischemic changes.  No  deformity or atrophy.  Neurologic: Sensation grossly intact in extremities.  Symmetrical.  Speech is fluent. Motor exam as listed above. Psychiatric: Judgment intact, Mood & affect appropriate for pt's clinical situation. Dermatologic: As above Lymph : No Cervical, Axillary, or Inguinal lymphadenopathy.  CBC Lab Results  Component Value Date   WBC 6.8 11/19/2019   HGB 10.7 (L) 11/19/2019   HCT 32.0 (L) 11/19/2019   MCV 77.5 (L) 11/19/2019   PLT 202 11/19/2019   BMET    Component Value Date/Time   NA 139 11/19/2019 1002   NA 138 12/03/2012 1414   K 3.3 (L) 11/19/2019 1002   K 4.5 12/03/2012 1414   CL 93 (L) 11/19/2019 1002   CL 101 12/03/2012 1414   CO2 31 11/19/2019 1002   CO2 27 12/03/2012 1414   GLUCOSE 142 (H) 11/19/2019 1002   GLUCOSE 326 (H) 12/03/2012 1414   BUN 21 11/19/2019 1002   BUN 37 (H) 12/03/2012 1414   CREATININE 5.11 (H) 11/19/2019 1002   CREATININE 7.04 (H) 12/03/2012 1414   CALCIUM 8.2 (L) 11/19/2019 1002   CALCIUM 8.4 (L) 12/03/2012 1414   GFRNONAA 8 (L) 11/19/2019 1002   GFRNONAA 6 (L) 12/03/2012 1414   GFRAA 9 (L) 11/19/2019 1002   GFRAA 7 (L) 12/03/2012 1414   Estimated Creatinine Clearance: 11.1 mL/min (A) (by C-G formula based on SCr of 5.11 mg/dL (H)).  COAG Lab Results  Component Value Date   INR 1.0 11/19/2019   INR 1.11 06/22/2015   INR 1.0 07/26/2011    Radiology No results found.  Assessment/Plan I am asked to evaluate the patient by the emergency department .  The patient was sent to the emergency room yesterday by her dialysis center due to significant bleeding after dialysis.  1.  Complication dialysis device with poorly functioning AV access:  Patient's left thigh dialysis access is malfunctioning with prolonged bleeding after dialysis. The patient will undergo angiography and correction of any problems using interventional techniques with the hope of restoring function to the access.  The risks and benefits were described to  the patient.  All questions were answered.  The patient agrees to proceed with angiography and intervention. Potassium will be drawn to ensure that it is an appropriate level prior to performing intervention.  2.  End-stage renal disease requiring hemodialysis:  Patient will continue dialysis therapy without further interruption if a successful intervention is not achieved then a tunneled catheter will be placed. Dialysis has already been arranged.  3.  Hypertension:  Patient will continue medical management; nephrology is following no changes in oral medications.  4. Diabetes mellitus:  Glucose will be monitored and oral medications been held this morning once the patient has undergone the patient's procedure po intake will be reinitiated and again Accu-Cheks will be used to assess the blood glucose level and treat as needed. The patient will be restarted on the patient's usual hypoglycemic regime  Discussed with Dr. Mayme Genta, PA-C  11/20/2019 11:37 AM

## 2019-11-20 NOTE — Op Note (Signed)
[facility]    OPERATIVE NOTE   PROCEDURE: 1.  Left femoral artery to femoral vein arteriovenous graft cannulation under ultrasound guidance 2.  Left thigh shuntogram 3.  Covered stent placement to the left side AV graft with 8 mm diameter by 15 cm length covered stent postdilated with a 7 mm balloon  PRE-OPERATIVE DIAGNOSIS: 1. ESRD 2. Malfunctioning left femoral artery to femoral vein arteriovenous graft with pronounced bleeding  POST-OPERATIVE DIAGNOSIS: same as above   SURGEON: [name], MD  ANESTHESIA: local with MCS  ESTIMATED BLOOD LOSS: 5 cc  FINDING(S): 1. Pseudoaneurysms at the access sites with no significant stenosis throughout the graft or the venous outflow.  Arterial anastomosis was patent.  Venous anastomosis patent.  SPECIMEN(S):  None  CONTRAST: 20 cc  FLUORO TIME: 1.7 minutes  MODERATE CONSCIOUS SEDATION TIME:  Approximately 15 minutes using 2 mg of Versed and 50 mcg of Fentanyl  INDICATIONS: Amy Wu is a 69 y.o. female who presents with malfunctioning left thigh arteriovenous graft.  She had an emergency room visit yesterday for profound bleeding from the arterial access site and has had multiple previous interventions to the graft.  The patient is scheduled for left thigh shuntogram.  The patient is aware the risks include but are not limited to: bleeding, infection, thrombosis of the cannulated access, and possible anaphylactic reaction to the contrast.  The patient is aware of the risks of the procedure and elects to proceed forward.  DESCRIPTION: After full informed written consent was obtained, the patient was brought back to the angiography suite and placed supine upon the angiography table.  The patient was connected to monitoring equipment. Moderate conscious sedation was administered during a face to face encounter throughout the procedure with my supervision of the RN administering medicines and monitoring the patient's  vital signs, pulse oximetry, telemetry and mental status throughout from the start of the procedure until the patient was taken to the recovery room The left thigh was prepped and draped in the standard fashion for a percutaneous access intervention.  Under ultrasound guidance, the left femoral artery to femoral vein arteriovenous graft was cannulated with a micropuncture needle under direct ultrasound guidance were it was patent and a permanent image was performed.  The microwire was advanced into the graft and the needle was exchanged for the a microsheath.  I then upsized to a 6 Fr Sheath and imaging was performed.  A Kumpe catheter was advanced to the arterial anastomosis as the access was in a retrograde fashion in the distal graft.  Hand injections were completed to image the access including the central venous system. This demonstrated pseudoaneurysms at the access sites with no significant stenosis throughout the graft or the venous outflow.  Arterial anastomosis was patent.  Venous anastomosis patent.  Based on the images, this patient will need covered stent placement to avoid further bleeding. I then gave the patient 3000 units of intravenous heparin. A 0.018 wire was advanced across the area and parked in the femoral artery.  I selected an 8 mm diameter by 15 cm length Viabahn stent and postdilated this with a 7 mm balloon with excellent angiographic completion result and exclusion of the pseudoaneurysms with no further extravasation or bleeding and no significant stenosis. Based on the completion imaging, no further intervention is necessary.  The wire and balloon were removed from the sheath.  A 4-0 Monocryl purse-string suture was sewn around the sheath.  The sheath was removed while tying  down the suture.  A sterile bandage was applied to the puncture site.  COMPLICATIONS: None  CONDITION: Stable   [name]  11/20/2019 2:22 PM    This note was created with Dragon Medical transcription  system. Any errors in dictation are purely unintentional.

## 2019-11-20 NOTE — Discharge Instructions (Signed)
Shuntogram, Care After °Refer to this sheet in the next few weeks. These instructions provide you with information on caring for yourself after your procedure. Your health care provider may also give you more specific instructions. Your treatment has been planned according to current medical practices, but problems sometimes occur. Call your health care provider if you have any problems or questions after your procedure. °What can I expect after the procedure? °After your procedure, it is typical to have the following: °· A small amount of discomfort in the area where the catheters were placed. °· A small amount of bruising around the fistula. °· Sleepiness and fatigue. ° °Follow these instructions at home: °· Rest at home for the day following your procedure. °· Do not drive or operate heavy machinery while taking pain medicine. °· Take medicines only as directed by your health care provider. °· Do not take baths, swim, or use a hot tub until your health care provider approves. You may shower 24 hours after the procedure or as directed by your health care provider. °· There are many different ways to close and cover an incision, including stitches, skin glue, and adhesive strips. Follow your health care provider's instructions on: °? Incision care. °? Bandage (dressing) changes and removal. °? Incision closure removal. °· Monitor your dialysis fistula carefully. °Contact a health care provider if: °· You have drainage, redness, swelling, or pain at your catheter site. °· You have a fever. °· You have chills. °Get help right away if: °· You feel weak. °· You have trouble balancing. °· You have trouble moving your arms or legs. °· You have problems with your speech or vision. °· You can no longer feel a vibration or buzz when you put your fingers over your dialysis fistula. °· The limb that was used for the procedure: °? Swells. °? Is painful. °? Is cold. °? Is discolored, such as blue or pale white. °This  information is not intended to replace advice given to you by your health care provider. Make sure you discuss any questions you have with your health care provider. °Document Released: 10/20/2013 Document Revised: 11/11/2015 Document Reviewed: 07/25/2013 °Elsevier Interactive Patient Education © 2018 [employer] ° °

## 2019-11-21 ENCOUNTER — Encounter: Payer: Self-pay | Admitting: Cardiology

## 2019-11-24 ENCOUNTER — Encounter (INDEPENDENT_AMBULATORY_CARE_PROVIDER_SITE_OTHER): Payer: Self-pay

## 2019-11-25 ENCOUNTER — Ambulatory Visit (INDEPENDENT_AMBULATORY_CARE_PROVIDER_SITE_OTHER): Payer: Medicare (Managed Care) | Admitting: Nurse Practitioner

## 2019-11-25 ENCOUNTER — Other Ambulatory Visit: Payer: Self-pay

## 2019-11-25 ENCOUNTER — Encounter (INDEPENDENT_AMBULATORY_CARE_PROVIDER_SITE_OTHER): Payer: Self-pay | Admitting: Nurse Practitioner

## 2019-11-25 ENCOUNTER — Ambulatory Visit (INDEPENDENT_AMBULATORY_CARE_PROVIDER_SITE_OTHER): Payer: Medicare (Managed Care)

## 2019-11-25 VITALS — BP 133/69 | HR 66 | Resp 16 | Ht <= 58 in | Wt 235.0 lb

## 2019-11-25 DIAGNOSIS — Z992 Dependence on renal dialysis: Secondary | ICD-10-CM | POA: Diagnosis not present

## 2019-11-25 DIAGNOSIS — N186 End stage renal disease: Secondary | ICD-10-CM

## 2019-11-25 DIAGNOSIS — E785 Hyperlipidemia, unspecified: Secondary | ICD-10-CM | POA: Diagnosis not present

## 2019-11-25 DIAGNOSIS — E7800 Pure hypercholesterolemia, unspecified: Secondary | ICD-10-CM

## 2019-11-25 NOTE — Progress Notes (Signed)
Subjective:    Patient ID: Amy Wu, female    DOB: 1951/03/30, 69 y.o.   MRN: [id number] Chief Complaint  Patient presents with  . Follow-up    ultrasound    Patient presents today at the request of her dialysis center due to prolonged bleeding.  However the patient recently went to the hospital on November 19, 2019 for prolonged bleeding.  Initially they were able to get the bleeding to stop in the emergency room.  The patient subsequently had intervention on 11/20/2019 including:  PROCEDURE: 1.  Left femoral artery to femoral vein arteriovenous graft cannulation under ultrasound guidance 2.  Left thigh shuntogram 3.  Covered stent placement to the left side AV graft with 8 mm diameter by 15 cm length covered stent postdilated with a 7 mm balloon   Since the intervention was done the patient reports that there has been no issues with her dialysis access.  She denies any prolonged bleeding, pain or discomfort.  Overall since the intervention she is doing well.  Today noninvasive studies show a flow volume of 1884.  The loop graft appears to be patent throughout with a normal flow volume.  The study was somewhat hindered due to the fact that the patient was unable to recline her chair fully.  However previous study done on 07/22/2019 shows a flow volume of 1699.   Review of Systems  Neurological: Positive for weakness (bilateral amputee).  All other systems reviewed and are negative.      Objective:   Physical Exam Vitals reviewed.  HENT:     Head: Normocephalic.  Cardiovascular:     Rate and Rhythm: Normal rate and regular rhythm.     Pulses: Normal pulses.     Heart sounds: Normal heart sounds.     Arteriovenous access: left arteriovenous access is present.    Comments: Left thigh loop graft with good thrill and bruit Pulmonary:     Effort: Pulmonary effort is normal.     Breath sounds: Normal breath sounds.  Musculoskeletal:     Right Lower Extremity: Right leg is  amputated above knee.     Left Lower Extremity: Left leg is amputated below knee.  Neurological:     Mental Status: She is alert and oriented to person, place, and time.     Gait: Gait abnormal.  Psychiatric:        Mood and Affect: Mood normal.        Behavior: Behavior normal.        Thought Content: Thought content normal.        Judgment: Judgment normal.     BP 133/69 (BP Location: Left Arm)   Pulse 66   Resp 16   Ht 4\' 10"  (1.473 m)   Wt 235 lb (106.6 kg)   BMI 49.12 kg/m   Past Medical History:  Diagnosis Date  . Arthritis   . Chronic kidney disease   . Diabetes mellitus without complication ([place])   . Hyperlipidemia   . Hypertension   . Peripheral vascular disease ([place])     Social History   Socioeconomic History  . Marital status: Single    Spouse name: Not on file  . Number of children: Not on file  . Years of education: Not on file  . Highest education level: Not on file  Occupational History  . Not on file  Tobacco Use  . Smoking status: Never Smoker  . Smokeless tobacco: Never Used  Substance and Sexual Activity  .  Alcohol use: No  . Drug use: No  . Sexual activity: Not on file  Other Topics Concern  . Not on file  Social History Narrative  . Not on file   Social Determinants of Health   Financial Resource Strain:   . Difficulty of Paying Living Expenses:   Food Insecurity:   . Worried About Charity fundraiser in the Last Year:   . Arboriculturist in the Last Year:   Transportation Needs:   . Film/video editor (Medical):   Marland Kitchen Lack of Transportation (Non-Medical):   Physical Activity:   . Days of Exercise per Week:   . Minutes of Exercise per Session:   Stress:   . Feeling of Stress :   Social Connections:   . Frequency of Communication with Friends and Family:   . Frequency of Social Gatherings with Friends and Family:   . Attends Religious Services:   . Active Member of Clubs or Organizations:   . Attends Archivist  Meetings:   Marland Kitchen Marital Status:   Intimate Partner Violence:   . Fear of Current or Ex-Partner:   . Emotionally Abused:   Marland Kitchen Physically Abused:   . Sexually Abused:     Past Surgical History:  Procedure Laterality Date  . A/V SHUNT INTERVENTION Left 11/20/2019   Procedure: A/V SHUNT INTERVENTION (Left Thigh);  Surgeon: Algernon Huxley, MD;  Location: [place] [facility];  Service: Cardiovascular;  Laterality: Left;  . A/V SHUNTOGRAM Left 02/16/2017   Procedure: A/V Shuntogram;  Surgeon: Algernon Huxley, MD;  Location: [place] [facility];  Service: Cardiovascular;  Laterality: Left;  . A/V SHUNTOGRAM Left 12/13/2017   Procedure: A/V SHUNTOGRAM;  Surgeon: Algernon Huxley, MD;  Location: [place] [facility];  Service: Cardiovascular;  Laterality: Left;  . A/V SHUNTOGRAM Left 12/05/2018   Procedure: A/V SHUNTOGRAM;  Surgeon: Algernon Huxley, MD;  Location: [place] [facility];  Service: Cardiovascular;  Laterality: Left;  . ABOVE KNEE LEG AMPUTATION    . AV FISTULA PLACEMENT Bilateral   . AV FISTULA PLACEMENT Left 07/01/2015   Procedure: INSERTION OF ARTERIOVENOUS (AV) GRAFT THIGH (ARTEGRAFT);  Surgeon: Algernon Huxley, MD;  Location: [facility];  Service: Vascular;  Laterality: Left;  . BELOW KNEE LEG AMPUTATION Left   . ENDARTERECTOMY FEMORAL Left 07/01/2015   Procedure: SUPERFICIAL FEMORAL ENDARTERECTOMY ;  Surgeon: Algernon Huxley, MD;  Location: [facility];  Service: Vascular;  Laterality: Left;  . EXCHANGE OF A DIALYSIS CATHETER  02/11/2015   Procedure: Exchange Of A Dialysis Catheter;  Surgeon: Algernon Huxley, MD;  Location: [place] [facility];  Service: Cardiovascular;;  . LEG AMPUTATION ABOVE KNEE Right   . PERIPHERAL VASCULAR CATHETERIZATION N/A 02/11/2015   Procedure: Dialysis/Perma Catheter Insertion;  Surgeon: Algernon Huxley, MD;  Location: [place] [facility];  Service: Cardiovascular;  Laterality: N/A;  . PERIPHERAL VASCULAR CATHETERIZATION N/A 05/11/2015   Procedure: Dialysis/Perma Catheter Insertion,  exchange;  Surgeon: Katha Cabal, MD;  Location: [place] [facility];  Service: Cardiovascular;  Laterality: N/A;  . PERIPHERAL VASCULAR CATHETERIZATION N/A 05/18/2015   Procedure: Dialysis/Perma Catheter Insertion;  Surgeon: Katha Cabal, MD;  Location: [place] [facility];  Service: Cardiovascular;  Laterality: N/A;  . PERIPHERAL VASCULAR CATHETERIZATION Left 07/19/2015   Procedure: A/V Shuntogram/Fistulagram;  Surgeon: Algernon Huxley, MD;  Location: [place] [facility];  Service: Cardiovascular;  Laterality: Left;  . PERIPHERAL VASCULAR CATHETERIZATION N/A 07/19/2015  Procedure: A/V Shunt Intervention;  Surgeon: Algernon Huxley, MD;  Location: [place] [facility];  Service: Cardiovascular;  Laterality: N/A;  . PERIPHERAL VASCULAR CATHETERIZATION N/A 08/26/2015   Procedure: Dialysis/Perma Catheter Removal;  Surgeon: Algernon Huxley, MD;  Location: [place] [facility];  Service: Cardiovascular;  Laterality: N/A;  . PERIPHERAL VASCULAR THROMBECTOMY N/A 12/29/2016   Procedure: Peripheral Vascular Thrombectomy;  Surgeon: Katha Cabal, MD;  Location: [place] [facility];  Service: Cardiovascular;  Laterality: N/A;    Family History  Problem Relation Age of Onset  . Breast cancer Neg Hx     Allergies  Allergen Reactions  . Ancef [Cefazolin] Palpitations  . Contrast Media [Iodinated Diagnostic Agents] Palpitations       Assessment & Plan:   1. End stage renal disease ([place]) Recommend:  The patient is doing well and currently has adequate dialysis access. The patient's dialysis center is not reporting any access issues. Flow pattern is stable when compared to the prior ultrasound.  The patient should have a duplex ultrasound of the dialysis access in 6 months. The patient will follow-up with me in the office after each ultrasound     2. Hyperlipidemia, unspecified hyperlipidemia type Continue statin as ordered and reviewed, no changes at this time   3. Pure  hypercholesterolemia Continue statin as ordered and reviewed, no changes at this time    Current Outpatient Medications on File Prior to Visit  Medication Sig Dispense Refill  . acetaminophen (TYLENOL) 500 MG tablet Take 1,000 mg by mouth every 8 (eight) hours as needed for moderate pain.    Marland Kitchen aspirin 81 MG tablet Take 81 mg by mouth daily.    Marland Kitchen atorvastatin (LIPITOR) 40 MG tablet Take 40 mg by mouth daily.    . calcitRIOL (ROCALTROL) 0.25 MCG capsule Take 0.25 mcg by mouth daily. Take 3 capsules by mouth three times week at dialysis    . camphor-menthol (SARNA) lotion Apply 1 application topically as needed for itching. Twice a day if needed    . Cholecalciferol (VITAMIN D3) 2000 units TABS Take 2,000 Units by mouth daily.    . cinacalcet (SENSIPAR) 30 MG tablet Take 30 mg by mouth daily with supper.     . ferrous sulfate 325 (65 FE) MG tablet Take 325 mg by mouth every other day.    . fluticasone (FLONASE) 50 MCG/ACT nasal spray Place into both nostrils daily.    . insulin aspart (NOVOLOG) 100 UNIT/ML injection Inject 1 Units into the skin See admin instructions. Pt uses insulin PRN for Blood Sugar greater then 200 uses 1 unit if greater then 200     . lanthanum (FOSRENOL) 1000 MG chewable tablet Chew 1,000 mg by mouth 2 (two) times daily with a meal.     . liver oil-zinc oxide (DESITIN) 40 % ointment Apply 1 application topically as needed for irritation.    Marland Kitchen loperamide (IMODIUM A-D) 2 MG tablet Take 2 mg by mouth 4 (four) times daily as needed for diarrhea or loose stools. 2 tablets by mouth for onset of loose stool, than 1 tablet by mouth after each loose stool MAX 4 tablets in 24 hours    . loratadine (CLARITIN) 10 MG tablet Take 10 mg by mouth daily as needed for allergies.    Marland Kitchen rOPINIRole (REQUIP) 0.25 MG tablet Take 0.25 mg by mouth at bedtime as needed.    . senna-docusate (SENOKOT-S) 8.6-50 MG tablet Take 1-2 tablets by mouth at bedtime as needed for  mild constipation or moderate  constipation. 1-2 tablets at bedtime if needed     . Dextromethorphan-guaiFENesin (MUCINEX DM) 30-600 MG TB12 Take by mouth.    Marland Kitchen glucose 4 GM chewable tablet Chew 1 tablet by mouth as needed for low blood sugar. If blood sugar less than 70. Chew 3 tablets and recheck blood sugar in 15 minutes    . polyethylene glycol (MIRALAX / GLYCOLAX) packet Take 17 g by mouth daily as needed for moderate constipation or severe constipation.    . Skin Protectants, Misc. (EUCERIN) cream Apply 1 application topically 4 (four) times daily as needed for dry skin.      No current facility-administered medications on file prior to visit.    There are no Patient Instructions on file for this visit. No follow-ups on file.   Kris Hartmann, NP

## 2020-01-20 ENCOUNTER — Encounter (INDEPENDENT_AMBULATORY_CARE_PROVIDER_SITE_OTHER): Payer: Medicare (Managed Care)

## 2020-01-20 ENCOUNTER — Ambulatory Visit (INDEPENDENT_AMBULATORY_CARE_PROVIDER_SITE_OTHER): Payer: Medicare (Managed Care) | Admitting: Vascular Surgery

## 2020-03-11 DIAGNOSIS — Z1152 Encounter for screening for COVID-19: Secondary | ICD-10-CM | POA: Insufficient documentation

## 2020-05-18 ENCOUNTER — Other Ambulatory Visit (INDEPENDENT_AMBULATORY_CARE_PROVIDER_SITE_OTHER): Payer: Self-pay | Admitting: Nurse Practitioner

## 2020-05-18 DIAGNOSIS — N186 End stage renal disease: Secondary | ICD-10-CM

## 2020-05-20 ENCOUNTER — Ambulatory Visit (INDEPENDENT_AMBULATORY_CARE_PROVIDER_SITE_OTHER): Payer: Medicare (Managed Care) | Admitting: Nurse Practitioner

## 2020-05-20 ENCOUNTER — Other Ambulatory Visit: Payer: Self-pay

## 2020-05-20 ENCOUNTER — Encounter (INDEPENDENT_AMBULATORY_CARE_PROVIDER_SITE_OTHER): Payer: Self-pay | Admitting: Nurse Practitioner

## 2020-05-20 ENCOUNTER — Ambulatory Visit (INDEPENDENT_AMBULATORY_CARE_PROVIDER_SITE_OTHER): Payer: Medicare (Managed Care)

## 2020-05-20 VITALS — BP 169/89 | HR 67 | Resp 16 | Wt 218.0 lb

## 2020-05-20 DIAGNOSIS — E785 Hyperlipidemia, unspecified: Secondary | ICD-10-CM | POA: Diagnosis not present

## 2020-05-20 DIAGNOSIS — Z992 Dependence on renal dialysis: Secondary | ICD-10-CM | POA: Diagnosis not present

## 2020-05-20 DIAGNOSIS — N186 End stage renal disease: Secondary | ICD-10-CM | POA: Diagnosis not present

## 2020-05-20 DIAGNOSIS — E1122 Type 2 diabetes mellitus with diabetic chronic kidney disease: Secondary | ICD-10-CM | POA: Diagnosis not present

## 2020-05-20 DIAGNOSIS — Z794 Long term (current) use of insulin: Secondary | ICD-10-CM

## 2020-05-20 NOTE — H&P (View-Only) (Signed)
Subjective:    Patient ID: Amy Wu, female    DOB: [birth year], 69 y.o.   MRN: [id number] Chief Complaint  Patient presents with  . Follow-up    ultrasound follow up    The patient returns to the office for follow up regarding problem with the dialysis access. Currently the patient is maintained via a left thigh AV Graft.   The patient has had multiple failed upper extremity accesses.  The patient notes a significant increase in bleeding time after decannulation.  The patient has also been informed that there is increased recirculation.    The patient denies hand pain or other symptoms consistent with steal phenomena.  No significant arm swelling.  The patient denies redness or swelling at the access site. The patient denies fever or chills at home or while on dialysis.  The patient denies amaurosis fugax or recent TIA symptoms. There are no recent neurological changes noted. The patient denies claudication symptoms or rest pain symptoms. The patient denies history of DVT, PE or superficial thrombophlebitis. The patient denies recent episodes of angina or shortness of breath.   Today the patient has a flow volume of 6144, this is a substantial increase from the previous velocity of 1884 on 11/25/2019.     Review of Systems  Musculoskeletal: Positive for gait problem.  Neurological: Positive for weakness.  Hematological: Bruises/bleeds easily.  All other systems reviewed and are negative.      Objective:   Physical Exam Vitals reviewed.  HENT:     Head: Normocephalic.  Cardiovascular:     Rate and Rhythm: Normal rate.     Arteriovenous access: left arteriovenous access is present.    Comments: Left thigh AV graft with good thrill and bruit but some bleeding today  Pulmonary:     Effort: Pulmonary effort is normal.  Musculoskeletal:     Right Lower Extremity: Right leg is amputated above knee.     Left Lower Extremity: Left leg is amputated below knee.   Neurological:     Mental Status: She is alert and oriented to person, place, and time.     Motor: Weakness present.  Psychiatric:        Mood and Affect: Mood normal.        Behavior: Behavior normal.        Thought Content: Thought content normal.        Judgment: Judgment normal.     BP (!) 169/89 (BP Location: Left Arm)   Pulse 67   Resp 16   Wt 218 lb (98.9 kg)   BMI 45.56 kg/m   Past Medical History:  Diagnosis Date  . Arthritis   . Chronic kidney disease   . Diabetes mellitus without complication ([place])   . Hyperlipidemia   . Hypertension   . Peripheral vascular disease ([place])     Social History   Socioeconomic History  . Marital status: Single    Spouse name: Not on file  . Number of children: Not on file  . Years of education: Not on file  . Highest education level: Not on file  Occupational History  . Not on file  Tobacco Use  . Smoking status: Never Smoker  . Smokeless tobacco: Never Used  Substance and Sexual Activity  . Alcohol use: No  . Drug use: No  . Sexual activity: Not on file  Other Topics Concern  . Not on file  Social History Narrative  . Not on file   Social Determinants  of Health   Financial Resource Strain:   . Difficulty of Paying Living Expenses: Not on file  Food Insecurity:   . Worried About Charity fundraiser in the Last Year: Not on file  . Ran Out of Food in the Last Year: Not on file  Transportation Needs:   . Lack of Transportation (Medical): Not on file  . Lack of Transportation (Non-Medical): Not on file  Physical Activity:   . Days of Exercise per Week: Not on file  . Minutes of Exercise per Session: Not on file  Stress:   . Feeling of Stress : Not on file  Social Connections:   . Frequency of Communication with Friends and Family: Not on file  . Frequency of Social Gatherings with Friends and Family: Not on file  . Attends Religious Services: Not on file  . Active Member of Clubs or Organizations: Not on file   . Attends Archivist Meetings: Not on file  . Marital Status: Not on file  Intimate Partner Violence:   . Fear of Current or Ex-Partner: Not on file  . Emotionally Abused: Not on file  . Physically Abused: Not on file  . Sexually Abused: Not on file    Past Surgical History:  Procedure Laterality Date  . A/V SHUNT INTERVENTION Left 11/20/2019   Procedure: A/V SHUNT INTERVENTION (Left Thigh);  Surgeon: Algernon Huxley, MD;  Location: [place] [facility];  Service: Cardiovascular;  Laterality: Left;  . A/V SHUNTOGRAM Left 02/16/2017   Procedure: A/V Shuntogram;  Surgeon: Algernon Huxley, MD;  Location: [place] [facility];  Service: Cardiovascular;  Laterality: Left;  . A/V SHUNTOGRAM Left 12/13/2017   Procedure: A/V SHUNTOGRAM;  Surgeon: Algernon Huxley, MD;  Location: [place] [facility];  Service: Cardiovascular;  Laterality: Left;  . A/V SHUNTOGRAM Left 12/05/2018   Procedure: A/V SHUNTOGRAM;  Surgeon: Algernon Huxley, MD;  Location: [place] [facility];  Service: Cardiovascular;  Laterality: Left;  . ABOVE KNEE LEG AMPUTATION    . AV FISTULA PLACEMENT Bilateral   . AV FISTULA PLACEMENT Left 07/01/2015   Procedure: INSERTION OF ARTERIOVENOUS (AV) GRAFT THIGH (ARTEGRAFT);  Surgeon: Algernon Huxley, MD;  Location: [facility];  Service: Vascular;  Laterality: Left;  . BELOW KNEE LEG AMPUTATION Left   . ENDARTERECTOMY FEMORAL Left 07/01/2015   Procedure: SUPERFICIAL FEMORAL ENDARTERECTOMY ;  Surgeon: Algernon Huxley, MD;  Location: [facility];  Service: Vascular;  Laterality: Left;  . EXCHANGE OF A DIALYSIS CATHETER  02/11/2015   Procedure: Exchange Of A Dialysis Catheter;  Surgeon: Algernon Huxley, MD;  Location: [place] [facility];  Service: Cardiovascular;;  . LEG AMPUTATION ABOVE KNEE Right   . PERIPHERAL VASCULAR CATHETERIZATION N/A 02/11/2015   Procedure: Dialysis/Perma Catheter Insertion;  Surgeon: Algernon Huxley, MD;  Location: [place] [facility];  Service: Cardiovascular;  Laterality: N/A;  .  PERIPHERAL VASCULAR CATHETERIZATION N/A 05/11/2015   Procedure: Dialysis/Perma Catheter Insertion, exchange;  Surgeon: Katha Cabal, MD;  Location: [place] [facility];  Service: Cardiovascular;  Laterality: N/A;  . PERIPHERAL VASCULAR CATHETERIZATION N/A 05/18/2015   Procedure: Dialysis/Perma Catheter Insertion;  Surgeon: Katha Cabal, MD;  Location: [place] [facility];  Service: Cardiovascular;  Laterality: N/A;  . PERIPHERAL VASCULAR CATHETERIZATION Left 07/19/2015   Procedure: A/V Shuntogram/Fistulagram;  Surgeon: Algernon Huxley, MD;  Location: [place] [facility];  Service: Cardiovascular;  Laterality: Left;  . PERIPHERAL VASCULAR CATHETERIZATION N/A 07/19/2015   Procedure:  A/V Shunt Intervention;  Surgeon: Algernon Huxley, MD;  Location: [place] [facility];  Service: Cardiovascular;  Laterality: N/A;  . PERIPHERAL VASCULAR CATHETERIZATION N/A 08/26/2015   Procedure: Dialysis/Perma Catheter Removal;  Surgeon: Algernon Huxley, MD;  Location: [place] [facility];  Service: Cardiovascular;  Laterality: N/A;  . PERIPHERAL VASCULAR THROMBECTOMY N/A 12/29/2016   Procedure: Peripheral Vascular Thrombectomy;  Surgeon: Katha Cabal, MD;  Location: [place] [facility];  Service: Cardiovascular;  Laterality: N/A;    Family History  Problem Relation Age of Onset  . Breast cancer Neg Hx     Allergies  Allergen Reactions  . Ancef [Cefazolin] Palpitations  . Contrast Media [Iodinated Diagnostic Agents] Palpitations    CBC Latest Ref Rng & Units 11/19/2019 07/01/2015 06/22/2015  WBC 4.0 - 10.5 K/uL 6.8 - 9.9  Hemoglobin 12.0 - 15.0 g/dL 10.7(L) 15.3(H) 13.4  Hematocrit 36 - 46 % 32.0(L) 45.0 41.2  Platelets 150 - 400 K/uL 202 - 169      CMP     Component Value Date/Time   NA 139 11/19/2019 1002   NA 138 12/03/2012 1414   K 3.3 (L) 11/19/2019 1002   K 4.5 12/03/2012 1414   CL 93 (L) 11/19/2019 1002   CL 101 12/03/2012 1414   CO2 31 11/19/2019 1002   CO2 27 12/03/2012 1414    GLUCOSE 142 (H) 11/19/2019 1002   GLUCOSE 326 (H) 12/03/2012 1414   BUN 21 11/19/2019 1002   BUN 37 (H) 12/03/2012 1414   CREATININE 5.11 (H) 11/19/2019 1002   CREATININE 7.04 (H) 12/03/2012 1414   CALCIUM 8.2 (L) 11/19/2019 1002   CALCIUM 8.4 (L) 12/03/2012 1414   PROT 6.9 12/28/2011 1746   ALBUMIN 2.7 (L) 12/28/2011 1746   AST 12 (L) 12/28/2011 1746   ALT 7 (L) 12/28/2011 1746   ALKPHOS 154 (H) 12/28/2011 1746   BILITOT 0.3 12/28/2011 1746   GFRNONAA 8 (L) 11/19/2019 1002   GFRNONAA 6 (L) 12/03/2012 1414   GFRAA 9 (L) 11/19/2019 1002   GFRAA 7 (L) 12/03/2012 1414     No results found.     Assessment & Plan:    1. ESRD (end stage renal disease) ([place]) Recommend:  The patient is experiencing increasing problems with their dialysis access.  Patient should have a left thigh shuntogram with the intention for intervention.  The intention for intervention is to restore appropriate flow and prevent thrombosis and possible loss of the access.  As well as improve the quality of dialysis therapy.  The risks, benefits and alternative therapies were reviewed in detail with the patient.  All questions were answered.  The patient agrees to proceed with angio/intervention.      2. Hyperlipidemia, unspecified hyperlipidemia type Continue statin as ordered and reviewed, no changes at this time   3. Type 2 diabetes mellitus with chronic kidney disease on chronic dialysis, with long-term current use of insulin ([facility]) Continue hypoglycemic medications as already ordered, these medications have been reviewed and there are no changes at this time.  Hgb A1C to be monitored as already arranged by primary service    Current Outpatient Medications on File Prior to Visit  Medication Sig Dispense Refill  . acetaminophen (TYLENOL) 500 MG tablet Take 1,000 mg by mouth every 8 (eight) hours as needed for moderate pain.    Marland Kitchen aspirin 81 MG tablet Take 81 mg by mouth daily.    Marland Kitchen atorvastatin  (LIPITOR) 40 MG tablet Take 40 mg by mouth  daily.    . calcitRIOL (ROCALTROL) 0.25 MCG capsule Take 0.25 mcg by mouth daily. Take 3 capsules by mouth three times week at dialysis    . camphor-menthol (SARNA) lotion Apply 1 application topically as needed for itching. Twice a day if needed    . Cholecalciferol (VITAMIN D3) 2000 units TABS Take 2,000 Units by mouth daily.    . cinacalcet (SENSIPAR) 30 MG tablet Take 30 mg by mouth daily with supper.     . Dextromethorphan-guaiFENesin (MUCINEX DM) 30-600 MG TB12 Take by mouth.    . ferrous sulfate 325 (65 FE) MG tablet Take 325 mg by mouth every other day.    . fluticasone (FLONASE) 50 MCG/ACT nasal spray Place into both nostrils daily.    Marland Kitchen glucose 4 GM chewable tablet Chew 1 tablet by mouth as needed for low blood sugar. If blood sugar less than 70. Chew 3 tablets and recheck blood sugar in 15 minutes    . lanthanum (FOSRENOL) 1000 MG chewable tablet Chew 1,000 mg by mouth 2 (two) times daily with a meal.     . liver oil-zinc oxide (DESITIN) 40 % ointment Apply 1 application topically as needed for irritation.    Marland Kitchen loperamide (IMODIUM A-D) 2 MG tablet Take 2 mg by mouth 4 (four) times daily as needed for diarrhea or loose stools. 2 tablets by mouth for onset of loose stool, than 1 tablet by mouth after each loose stool MAX 4 tablets in 24 hours    . loratadine (CLARITIN) 10 MG tablet Take 10 mg by mouth daily as needed for allergies.    . polyethylene glycol (MIRALAX / GLYCOLAX) packet Take 17 g by mouth daily as needed for moderate constipation or severe constipation.    Marland Kitchen rOPINIRole (REQUIP) 0.25 MG tablet Take 0.25 mg by mouth at bedtime as needed.    . senna-docusate (SENOKOT-S) 8.6-50 MG tablet Take 1-2 tablets by mouth at bedtime as needed for mild constipation or moderate constipation. 1-2 tablets at bedtime if needed     . Skin Protectants, Misc. (EUCERIN) cream Apply 1 application topically 4 (four) times daily as needed for dry skin.     Marland Kitchen  insulin aspart (NOVOLOG) 100 UNIT/ML injection Inject 1 Units into the skin See admin instructions. Pt uses insulin PRN for Blood Sugar greater then 200 uses 1 unit if greater then 200  (Patient not taking: Reported on 05/20/2020)     No current facility-administered medications on file prior to visit.    There are no Patient Instructions on file for this visit. No follow-ups on file.   Kris Hartmann, NP

## 2020-05-20 NOTE — Progress Notes (Signed)
Subjective:    Patient ID: Amy Wu, female    DOB: 1951-01-22, 69 y.o.   MRN: [id number] Chief Complaint  Patient presents with  . Follow-up    ultrasound follow up    The patient returns to the office for follow up regarding problem with the dialysis access. Currently the patient is maintained via a left thigh AV Graft.   The patient has had multiple failed upper extremity accesses.  The patient notes a significant increase in bleeding time after decannulation.  The patient has also been informed that there is increased recirculation.    The patient denies hand pain or other symptoms consistent with steal phenomena.  No significant arm swelling.  The patient denies redness or swelling at the access site. The patient denies fever or chills at home or while on dialysis.  The patient denies amaurosis fugax or recent TIA symptoms. There are no recent neurological changes noted. The patient denies claudication symptoms or rest pain symptoms. The patient denies history of DVT, PE or superficial thrombophlebitis. The patient denies recent episodes of angina or shortness of breath.   Today the patient has a flow volume of 6144, this is a substantial increase from the previous velocity of 1884 on 11/25/2019.     Review of Systems  Musculoskeletal: Positive for gait problem.  Neurological: Positive for weakness.  Hematological: Bruises/bleeds easily.  All other systems reviewed and are negative.      Objective:   Physical Exam Vitals reviewed.  HENT:     Head: Normocephalic.  Cardiovascular:     Rate and Rhythm: Normal rate.     Arteriovenous access: left arteriovenous access is present.    Comments: Left thigh AV graft with good thrill and bruit but some bleeding today  Pulmonary:     Effort: Pulmonary effort is normal.  Musculoskeletal:     Right Lower Extremity: Right leg is amputated above knee.     Left Lower Extremity: Left leg is amputated below knee.   Neurological:     Mental Status: She is alert and oriented to person, place, and time.     Motor: Weakness present.  Psychiatric:        Mood and Affect: Mood normal.        Behavior: Behavior normal.        Thought Content: Thought content normal.        Judgment: Judgment normal.     BP (!) 169/89 (BP Location: Left Arm)   Pulse 67   Resp 16   Wt 218 lb (98.9 kg)   BMI 45.56 kg/m   Past Medical History:  Diagnosis Date  . Arthritis   . Chronic kidney disease   . Diabetes mellitus without complication ([place])   . Hyperlipidemia   . Hypertension   . Peripheral vascular disease ([place])     Social History   Socioeconomic History  . Marital status: Single    Spouse name: Not on file  . Number of children: Not on file  . Years of education: Not on file  . Highest education level: Not on file  Occupational History  . Not on file  Tobacco Use  . Smoking status: Never Smoker  . Smokeless tobacco: Never Used  Substance and Sexual Activity  . Alcohol use: No  . Drug use: No  . Sexual activity: Not on file  Other Topics Concern  . Not on file  Social History Narrative  . Not on file   Social Determinants  of Health   Financial Resource Strain:   . Difficulty of Paying Living Expenses: Not on file  Food Insecurity:   . Worried About Charity fundraiser in the Last Year: Not on file  . Ran Out of Food in the Last Year: Not on file  Transportation Needs:   . Lack of Transportation (Medical): Not on file  . Lack of Transportation (Non-Medical): Not on file  Physical Activity:   . Days of Exercise per Week: Not on file  . Minutes of Exercise per Session: Not on file  Stress:   . Feeling of Stress : Not on file  Social Connections:   . Frequency of Communication with Friends and Family: Not on file  . Frequency of Social Gatherings with Friends and Family: Not on file  . Attends Religious Services: Not on file  . Active Member of Clubs or Organizations: Not on file   . Attends Archivist Meetings: Not on file  . Marital Status: Not on file  Intimate Partner Violence:   . Fear of Current or Ex-Partner: Not on file  . Emotionally Abused: Not on file  . Physically Abused: Not on file  . Sexually Abused: Not on file    Past Surgical History:  Procedure Laterality Date  . A/V SHUNT INTERVENTION Left 11/20/2019   Procedure: A/V SHUNT INTERVENTION (Left Thigh);  Surgeon: Algernon Huxley, MD;  Location: [facility];  Service: Cardiovascular;  Laterality: Left;  . A/V SHUNTOGRAM Left 02/16/2017   Procedure: A/V Shuntogram;  Surgeon: Algernon Huxley, MD;  Location: [facility];  Service: Cardiovascular;  Laterality: Left;  . A/V SHUNTOGRAM Left 12/13/2017   Procedure: A/V SHUNTOGRAM;  Surgeon: Algernon Huxley, MD;  Location: [facility];  Service: Cardiovascular;  Laterality: Left;  . A/V SHUNTOGRAM Left 12/05/2018   Procedure: A/V SHUNTOGRAM;  Surgeon: Algernon Huxley, MD;  Location: [facility];  Service: Cardiovascular;  Laterality: Left;  . ABOVE KNEE LEG AMPUTATION    . AV FISTULA PLACEMENT Bilateral   . AV FISTULA PLACEMENT Left 07/01/2015   Procedure: INSERTION OF ARTERIOVENOUS (AV) GRAFT THIGH (ARTEGRAFT);  Surgeon: Algernon Huxley, MD;  Location: [facility];  Service: Vascular;  Laterality: Left;  . BELOW KNEE LEG AMPUTATION Left   . ENDARTERECTOMY FEMORAL Left 07/01/2015   Procedure: SUPERFICIAL FEMORAL ENDARTERECTOMY ;  Surgeon: Algernon Huxley, MD;  Location: [facility];  Service: Vascular;  Laterality: Left;  . EXCHANGE OF A DIALYSIS CATHETER  02/11/2015   Procedure: Exchange Of A Dialysis Catheter;  Surgeon: Algernon Huxley, MD;  Location: [facility];  Service: Cardiovascular;;  . LEG AMPUTATION ABOVE KNEE Right   . PERIPHERAL VASCULAR CATHETERIZATION N/A 02/11/2015   Procedure: Dialysis/Perma Catheter Insertion;  Surgeon: Algernon Huxley, MD;  Location: [facility];  Service: Cardiovascular;  Laterality: N/A;  .  PERIPHERAL VASCULAR CATHETERIZATION N/A 05/11/2015   Procedure: Dialysis/Perma Catheter Insertion, exchange;  Surgeon: Katha Cabal, MD;  Location: [facility];  Service: Cardiovascular;  Laterality: N/A;  . PERIPHERAL VASCULAR CATHETERIZATION N/A 05/18/2015   Procedure: Dialysis/Perma Catheter Insertion;  Surgeon: Katha Cabal, MD;  Location: [facility];  Service: Cardiovascular;  Laterality: N/A;  . PERIPHERAL VASCULAR CATHETERIZATION Left 07/19/2015   Procedure: A/V Shuntogram/Fistulagram;  Surgeon: Algernon Huxley, MD;  Location: [facility];  Service: Cardiovascular;  Laterality: Left;  . PERIPHERAL VASCULAR CATHETERIZATION N/A 07/19/2015   Procedure:  A/V Shunt Intervention;  Surgeon: Algernon Huxley, MD;  Location: [place] [facility];  Service: Cardiovascular;  Laterality: N/A;  . PERIPHERAL VASCULAR CATHETERIZATION N/A 08/26/2015   Procedure: Dialysis/Perma Catheter Removal;  Surgeon: Algernon Huxley, MD;  Location: [facility];  Service: Cardiovascular;  Laterality: N/A;  . PERIPHERAL VASCULAR THROMBECTOMY N/A 12/29/2016   Procedure: Peripheral Vascular Thrombectomy;  Surgeon: Katha Cabal, MD;  Location: [facility];  Service: Cardiovascular;  Laterality: N/A;    Family History  Problem Relation Age of Onset  . Breast cancer Neg Hx     Allergies  Allergen Reactions  . Ancef [Cefazolin] Palpitations  . Contrast Media [Iodinated Diagnostic Agents] Palpitations    CBC Latest Ref Rng & Units 11/19/2019 07/01/2015 06/22/2015  WBC 4.0 - 10.5 K/uL 6.8 - 9.9  Hemoglobin 12.0 - 15.0 g/dL 10.7(L) 15.3(H) 13.4  Hematocrit 36 - 46 % 32.0(L) 45.0 41.2  Platelets 150 - 400 K/uL 202 - 169      CMP     Component Value Date/Time   NA 139 11/19/2019 1002   NA 138 12/03/2012 1414   K 3.3 (L) 11/19/2019 1002   K 4.5 12/03/2012 1414   CL 93 (L) 11/19/2019 1002   CL 101 12/03/2012 1414   CO2 31 11/19/2019 1002   CO2 27 12/03/2012 1414    GLUCOSE 142 (H) 11/19/2019 1002   GLUCOSE 326 (H) 12/03/2012 1414   BUN 21 11/19/2019 1002   BUN 37 (H) 12/03/2012 1414   CREATININE 5.11 (H) 11/19/2019 1002   CREATININE 7.04 (H) 12/03/2012 1414   CALCIUM 8.2 (L) 11/19/2019 1002   CALCIUM 8.4 (L) 12/03/2012 1414   PROT 6.9 12/28/2011 1746   ALBUMIN 2.7 (L) 12/28/2011 1746   AST 12 (L) 12/28/2011 1746   ALT 7 (L) 12/28/2011 1746   ALKPHOS 154 (H) 12/28/2011 1746   BILITOT 0.3 12/28/2011 1746   GFRNONAA 8 (L) 11/19/2019 1002   GFRNONAA 6 (L) 12/03/2012 1414   GFRAA 9 (L) 11/19/2019 1002   GFRAA 7 (L) 12/03/2012 1414     No results found.     Assessment & Plan:    1. ESRD (end stage renal disease) ([place]) Recommend:  The patient is experiencing increasing problems with their dialysis access.  Patient should have a left thigh shuntogram with the intention for intervention.  The intention for intervention is to restore appropriate flow and prevent thrombosis and possible loss of the access.  As well as improve the quality of dialysis therapy.  The risks, benefits and alternative therapies were reviewed in detail with the patient.  All questions were answered.  The patient agrees to proceed with angio/intervention.      2. Hyperlipidemia, unspecified hyperlipidemia type Continue statin as ordered and reviewed, no changes at this time   3. Type 2 diabetes mellitus with chronic kidney disease on chronic dialysis, with long-term current use of insulin (HCC) Continue hypoglycemic medications as already ordered, these medications have been reviewed and there are no changes at this time.  Hgb A1C to be monitored as already arranged by primary service    Current Outpatient Medications on File Prior to Visit  Medication Sig Dispense Refill  . acetaminophen (TYLENOL) 500 MG tablet Take 1,000 mg by mouth every 8 (eight) hours as needed for moderate pain.    Marland Kitchen aspirin 81 MG tablet Take 81 mg by mouth daily.    Marland Kitchen atorvastatin  (LIPITOR) 40 MG tablet Take 40 mg by mouth  daily.    . calcitRIOL (ROCALTROL) 0.25 MCG capsule Take 0.25 mcg by mouth daily. Take 3 capsules by mouth three times week at dialysis    . camphor-menthol (SARNA) lotion Apply 1 application topically as needed for itching. Twice a day if needed    . Cholecalciferol (VITAMIN D3) 2000 units TABS Take 2,000 Units by mouth daily.    . cinacalcet (SENSIPAR) 30 MG tablet Take 30 mg by mouth daily with supper.     . Dextromethorphan-guaiFENesin (MUCINEX DM) 30-600 MG TB12 Take by mouth.    . ferrous sulfate 325 (65 FE) MG tablet Take 325 mg by mouth every other day.    . fluticasone (FLONASE) 50 MCG/ACT nasal spray Place into both nostrils daily.    Marland Kitchen glucose 4 GM chewable tablet Chew 1 tablet by mouth as needed for low blood sugar. If blood sugar less than 70. Chew 3 tablets and recheck blood sugar in 15 minutes    . lanthanum (FOSRENOL) 1000 MG chewable tablet Chew 1,000 mg by mouth 2 (two) times daily with a meal.     . liver oil-zinc oxide (DESITIN) 40 % ointment Apply 1 application topically as needed for irritation.    Marland Kitchen loperamide (IMODIUM A-D) 2 MG tablet Take 2 mg by mouth 4 (four) times daily as needed for diarrhea or loose stools. 2 tablets by mouth for onset of loose stool, than 1 tablet by mouth after each loose stool MAX 4 tablets in 24 hours    . loratadine (CLARITIN) 10 MG tablet Take 10 mg by mouth daily as needed for allergies.    . polyethylene glycol (MIRALAX / GLYCOLAX) packet Take 17 g by mouth daily as needed for moderate constipation or severe constipation.    Marland Kitchen rOPINIRole (REQUIP) 0.25 MG tablet Take 0.25 mg by mouth at bedtime as needed.    . senna-docusate (SENOKOT-S) 8.6-50 MG tablet Take 1-2 tablets by mouth at bedtime as needed for mild constipation or moderate constipation. 1-2 tablets at bedtime if needed     . Skin Protectants, Misc. (EUCERIN) cream Apply 1 application topically 4 (four) times daily as needed for dry skin.     Marland Kitchen  insulin aspart (NOVOLOG) 100 UNIT/ML injection Inject 1 Units into the skin See admin instructions. Pt uses insulin PRN for Blood Sugar greater then 200 uses 1 unit if greater then 200  (Patient not taking: Reported on 05/20/2020)     No current facility-administered medications on file prior to visit.    There are no Patient Instructions on file for this visit. No follow-ups on file.   Kris Hartmann, NP

## 2020-05-25 ENCOUNTER — Telehealth (INDEPENDENT_AMBULATORY_CARE_PROVIDER_SITE_OTHER): Payer: Self-pay

## 2020-05-25 NOTE — Telephone Encounter (Signed)
[facility] to [facility] the patient for a left arm shuntogram with Dr. Lucky Cowboy on 06/03/20 with a 8:00 am arrival time to the [facility]. Covid testing on 06/01/20 between 8-1 pm at the [facility]. Pre-procedure instructions were discussed and will be faxed to [facility].

## 2020-06-01 ENCOUNTER — Other Ambulatory Visit
Admission: RE | Admit: 2020-06-01 | Discharge: 2020-06-01 | Disposition: A | Discharge status: Home / Self Care | Payer: Medicare (Managed Care) | Source: Ambulatory Visit | Attending: Vascular Surgery | Admitting: Vascular Surgery

## 2020-06-01 ENCOUNTER — Encounter (INDEPENDENT_AMBULATORY_CARE_PROVIDER_SITE_OTHER): Payer: Medicare (Managed Care)

## 2020-06-01 ENCOUNTER — Ambulatory Visit (INDEPENDENT_AMBULATORY_CARE_PROVIDER_SITE_OTHER): Payer: Medicare (Managed Care) | Admitting: Vascular Surgery

## 2020-06-01 ENCOUNTER — Other Ambulatory Visit: Payer: Self-pay

## 2020-06-01 DIAGNOSIS — Z01812 Encounter for preprocedural laboratory examination: Secondary | ICD-10-CM | POA: Insufficient documentation

## 2020-06-01 DIAGNOSIS — Z20822 Contact with and (suspected) exposure to covid-19: Secondary | ICD-10-CM | POA: Diagnosis not present

## 2020-06-01 LAB — SARS CORONAVIRUS 2 (TAT 6-24 HRS): SARS Coronavirus 2: NEGATIVE

## 2020-06-03 ENCOUNTER — Encounter: Payer: Self-pay | Admitting: Vascular Surgery

## 2020-06-03 ENCOUNTER — Ambulatory Visit
Admission: RE | Admit: 2020-06-03 | Discharge: 2020-06-03 | Disposition: A | Discharge status: Home / Self Care | Payer: Medicare (Managed Care) | Attending: Vascular Surgery | Admitting: Vascular Surgery

## 2020-06-03 ENCOUNTER — Other Ambulatory Visit: Payer: Self-pay

## 2020-06-03 ENCOUNTER — Encounter
Admission: RE | Disposition: A | Discharge status: Home / Self Care | Payer: Self-pay | Source: Home / Self Care | Attending: Vascular Surgery

## 2020-06-03 ENCOUNTER — Other Ambulatory Visit (INDEPENDENT_AMBULATORY_CARE_PROVIDER_SITE_OTHER): Payer: Self-pay | Admitting: Nurse Practitioner

## 2020-06-03 DIAGNOSIS — Z7982 Long term (current) use of aspirin: Secondary | ICD-10-CM | POA: Insufficient documentation

## 2020-06-03 DIAGNOSIS — T82898A Other specified complication of vascular prosthetic devices, implants and grafts, initial encounter: Secondary | ICD-10-CM | POA: Diagnosis not present

## 2020-06-03 DIAGNOSIS — E785 Hyperlipidemia, unspecified: Secondary | ICD-10-CM | POA: Diagnosis not present

## 2020-06-03 DIAGNOSIS — N186 End stage renal disease: Secondary | ICD-10-CM

## 2020-06-03 DIAGNOSIS — E1122 Type 2 diabetes mellitus with diabetic chronic kidney disease: Secondary | ICD-10-CM | POA: Insufficient documentation

## 2020-06-03 DIAGNOSIS — Z79899 Other long term (current) drug therapy: Secondary | ICD-10-CM | POA: Diagnosis not present

## 2020-06-03 DIAGNOSIS — T82858A Stenosis of vascular prosthetic devices, implants and grafts, initial encounter: Secondary | ICD-10-CM | POA: Diagnosis not present

## 2020-06-03 DIAGNOSIS — Y841 Kidney dialysis as the cause of abnormal reaction of the patient, or of later complication, without mention of misadventure at the time of the procedure: Secondary | ICD-10-CM | POA: Insufficient documentation

## 2020-06-03 DIAGNOSIS — Z992 Dependence on renal dialysis: Secondary | ICD-10-CM | POA: Insufficient documentation

## 2020-06-03 HISTORY — PX: A/V SHUNTOGRAM: CATH118297

## 2020-06-03 LAB — POTASSIUM (ARMC VASCULAR LAB ONLY): Potassium (ARMC vascular lab): 4.4 (ref 3.5–5.1)

## 2020-06-03 LAB — GLUCOSE, CAPILLARY: Glucose-Capillary: 252 mg/dL — ABNORMAL HIGH (ref 70–99)

## 2020-06-03 SURGERY — A/V SHUNTOGRAM
Anesthesia: Moderate Sedation | Laterality: Left

## 2020-06-03 MED ORDER — FENTANYL CITRATE (PF) 100 MCG/2ML IJ SOLN
INTRAMUSCULAR | Status: AC
  Filled 2020-06-03: qty 2

## 2020-06-03 MED ORDER — METHYLPREDNISOLONE SODIUM SUCC 125 MG IJ SOLR
INTRAMUSCULAR | Status: AC
  Filled 2020-06-03: qty 2

## 2020-06-03 MED ORDER — FAMOTIDINE 20 MG PO TABS
ORAL_TABLET | ORAL | Status: AC
  Filled 2020-06-03: qty 1

## 2020-06-03 MED ORDER — HYDRALAZINE HCL 20 MG/ML IJ SOLN
INTRAMUSCULAR | Status: AC
  Filled 2020-06-03: qty 1

## 2020-06-03 MED ORDER — SODIUM CHLORIDE 0.9 % IV SOLN: INTRAVENOUS | Status: DC

## 2020-06-03 MED ORDER — HEPARIN SODIUM (PORCINE) 1000 UNIT/ML IJ SOLN
INTRAMUSCULAR | Status: DC | PRN
  Administered 2020-06-03: 3000 [IU] via INTRAVENOUS

## 2020-06-03 MED ORDER — DIPHENHYDRAMINE HCL 50 MG/ML IJ SOLN
50.0000 mg | Freq: Once | INTRAMUSCULAR | Status: AC | PRN
  Administered 2020-06-03: 08:00:00 50 mg via INTRAVENOUS

## 2020-06-03 MED ORDER — FENTANYL CITRATE (PF) 100 MCG/2ML IJ SOLN
INTRAMUSCULAR | Status: DC | PRN
  Administered 2020-06-03: 50 ug via INTRAVENOUS

## 2020-06-03 MED ORDER — HYDRALAZINE HCL 20 MG/ML IJ SOLN
INTRAMUSCULAR | Status: DC | PRN
  Administered 2020-06-03: 10 mg via INTRAVENOUS

## 2020-06-03 MED ORDER — FAMOTIDINE 20 MG PO TABS
40.0000 mg | ORAL_TABLET | Freq: Once | ORAL | Status: AC | PRN
  Administered 2020-06-03: 08:00:00 40 mg via ORAL

## 2020-06-03 MED ORDER — METHYLPREDNISOLONE SODIUM SUCC 125 MG IJ SOLR
125.0000 mg | Freq: Once | INTRAMUSCULAR | Status: AC | PRN
  Administered 2020-06-03: 08:00:00 125 mg via INTRAVENOUS

## 2020-06-03 MED ORDER — MIDAZOLAM HCL 2 MG/ML PO SYRP: 8.0000 mg | ORAL_SOLUTION | Freq: Once | ORAL | Status: DC | PRN

## 2020-06-03 MED ORDER — MIDAZOLAM HCL 2 MG/2ML IJ SOLN
INTRAMUSCULAR | Status: DC | PRN
  Administered 2020-06-03: 2 mg via INTRAVENOUS

## 2020-06-03 MED ORDER — ONDANSETRON HCL 4 MG/2ML IJ SOLN: 4.0000 mg | Freq: Four times a day (QID) | INTRAMUSCULAR | Status: DC | PRN

## 2020-06-03 MED ORDER — IODIXANOL 320 MG/ML IV SOLN
INTRAVENOUS | Status: DC | PRN
  Administered 2020-06-03: 10:00:00 30 mL

## 2020-06-03 MED ORDER — HYDROMORPHONE HCL 1 MG/ML IJ SOLN: 1.0000 mg | Freq: Once | INTRAMUSCULAR | Status: DC | PRN

## 2020-06-03 MED ORDER — DIPHENHYDRAMINE HCL 50 MG/ML IJ SOLN
INTRAMUSCULAR | Status: AC
  Filled 2020-06-03: qty 1

## 2020-06-03 MED ORDER — HEPARIN SODIUM (PORCINE) 1000 UNIT/ML IJ SOLN
INTRAMUSCULAR | Status: AC
  Filled 2020-06-03: qty 1

## 2020-06-03 MED ORDER — CLINDAMYCIN PHOSPHATE 300 MG/50ML IV SOLN: 300.0000 mg | Freq: Once | INTRAVENOUS | Status: AC

## 2020-06-03 MED ORDER — MIDAZOLAM HCL 5 MG/5ML IJ SOLN
INTRAMUSCULAR | Status: AC
  Filled 2020-06-03: qty 5

## 2020-06-03 MED ORDER — CLINDAMYCIN PHOSPHATE 300 MG/50ML IV SOLN
INTRAVENOUS | Status: AC
  Administered 2020-06-03: 09:00:00 300 mg via INTRAVENOUS
  Filled 2020-06-03: qty 50

## 2020-06-03 SURGICAL SUPPLY — 10 items
BALLN LUTONIX DCB 7X60X130 (BALLOONS) ×2
BALLOON LUTONIX DCB 7X60X130 (BALLOONS) ×1 IMPLANT
CANNULA 5F STIFF (CANNULA) ×2 IMPLANT
KIT ENCORE 26 ADVANTAGE (KITS) ×2 IMPLANT
PACK ANGIOGRAPHY (CUSTOM PROCEDURE TRAY) ×2 IMPLANT
SHEATH BRITE TIP 6FRX5.5 (SHEATH) ×2 IMPLANT
SUT MNCRL 4-0 (SUTURE) ×1
SUT MNCRL 4-0 27XMFL (SUTURE) ×1
SUTURE MNCRL 4-0 27XMF (SUTURE) ×1 IMPLANT
WIRE MAGIC TOR.035 180C (WIRE) ×2 IMPLANT

## 2020-06-03 NOTE — Interval H&P Note (Signed)
History and Physical Interval Note:  06/03/2020 9:00 AM  Amy Wu  has presented today for surgery, with the diagnosis of LT Arm Shuntogram   ESRD   CONTRAST MEDIA ALLERGY   Pt to have Covid test on 12-14.  The various methods of treatment have been discussed with the patient and family. After consideration of risks, benefits and other options for treatment, the patient has consented to  Procedure(s): A/V SHUNTOGRAM (Left) as a surgical intervention.  The patient's history has been reviewed, patient examined, no change in status, stable for surgery.  I have reviewed the patient's chart and labs.  Questions were answered to the patient's satisfaction.     [name]

## 2020-06-03 NOTE — Progress Notes (Signed)
Patient awake/alert x4.  Received HD yesterday, on M-W-F  Schedule.  No c/o's , lungs CTA, heart sounds regular.

## 2020-06-03 NOTE — Op Note (Signed)
[facility]    OPERATIVE NOTE   PROCEDURE: 1.  Left thigh femoral artery to femoral vein arteriovenous graft cannulation under ultrasound guidance 2.  Left thigh shuntogram and central venogram 3.  Percutaneous transluminal angioplasty of the venous anastomosis with 7 mm diameter by 6 cm length Lutonix drug-coated angioplasty balloon  PRE-OPERATIVE DIAGNOSIS: 1. ESRD 2. Malfunctioning left thigh arteriovenous graft  POST-OPERATIVE DIAGNOSIS: same as above   SURGEON: [name], MD  ANESTHESIA: local with MCS  ESTIMATED BLOOD LOSS: 5 cc  FINDING(S): 1. The graft itself had good flow with only mild in-stent stenosis throughout the graft.  The venous anastomosis at the distal edge of the previously placed stent had a moderate stenosis in the 60% range.  The iliac veins and IVC were widely patent.  SPECIMEN(S):  None  CONTRAST: 30 cc  FLUORO TIME: 0.9 minutes  MODERATE CONSCIOUS SEDATION TIME:  Approximately 20 minutes using 2 mg of Versed and 50 mcg of Fentanyl  INDICATIONS: Amy Wu is a 69 y.o. female who presents with malfunctioning left femoral artery to femoral vein arteriovenous graft.  The patient is scheduled for left shuntogram.  The patient is aware the risks include but are not limited to: bleeding, infection, thrombosis of the cannulated access, and possible anaphylactic reaction to the contrast.  The patient is aware of the risks of the procedure and elects to proceed forward.  DESCRIPTION: After full informed written consent was obtained, the patient was brought back to the angiography suite and placed supine upon the angiography table.  The patient was connected to monitoring equipment. Moderate conscious sedation was administered during a face to face encounter throughout the procedure with my supervision of the RN administering medicines and monitoring the patient's vital signs, pulse oximetry, telemetry and mental status throughout from  the start of the procedure until the patient was taken to the recovery room The left was prepped and draped in the standard fashion for a percutaneous access intervention.  Under ultrasound guidance, the left femoral artery to femoral vein arteriovenous graft was cannulated with a micropuncture needle under direct ultrasound guidance were it was patent and a permanent image was performed.  The microwire was advanced into the graft and the needle was exchanged for the a microsheath.  I then upsized to a 6 Fr Sheath and imaging was performed.  Hand injections were completed to image the access including the central venous system. This demonstrated that the graft itself had good flow with only mild in-stent stenosis throughout the graft.  The venous anastomosis at the distal edge of the previously placed stent had a moderate stenosis in the 60% range.  The iliac veins and IVC were widely patent.  Based on the images, this patient will need intervention to the venous anastomosis. I then gave the patient 3000 units of intravenous heparin.  I then crossed the stenosis with a Magic Tourqe wire.  Based on the imaging, a 7 mm x 6 cm Lutonix drug-coated angioplasty balloon was selected.  The balloon was centered around the stenosis and inflated to 10 ATM for 1 minute(s).  On completion imaging, a 10-15% residual stenosis was present.     Based on the completion imaging, no further intervention is necessary.  The wire and balloon were removed from the sheath.  A 4-0 Monocryl purse-string suture was sewn around the sheath.  The sheath was removed while tying down the suture.  A sterile bandage was applied to the puncture site.  COMPLICATIONS: None  CONDITION: Stable   [name]  06/03/2020 9:42 AM    This note was created with Dragon Medical transcription system. Any errors in dictation are purely unintentional.

## 2020-06-03 NOTE — Discharge Instructions (Signed)
Shuntogram, Care After °Refer to this sheet in the next few weeks. These instructions provide you with information on caring for yourself after your procedure. Your health care provider may also give you more specific instructions. Your treatment has been planned according to current medical practices, but problems sometimes occur. Call your health care provider if you have any problems or questions after your procedure. °What can I expect after the procedure? °After your procedure, it is typical to have the following: °· A small amount of discomfort in the area where the catheters were placed. °· A small amount of bruising around the fistula. °· Sleepiness and fatigue. ° °Follow these instructions at home: °· Rest at home for the day following your procedure. °· Do not drive or operate heavy machinery while taking pain medicine. °· Take medicines only as directed by your health care provider. °· Do not take baths, swim, or use a hot tub until your health care provider approves. You may shower 24 hours after the procedure or as directed by your health care provider. °· There are many different ways to close and cover an incision, including stitches, skin glue, and adhesive strips. Follow your health care provider's instructions on: °? Incision care. °? Bandage (dressing) changes and removal. °? Incision closure removal. °· Monitor your dialysis fistula carefully. °Contact a health care provider if: °· You have drainage, redness, swelling, or pain at your catheter site. °· You have a fever. °· You have chills. °Get help right away if: °· You feel weak. °· You have trouble balancing. °· You have trouble moving your arms or legs. °· You have problems with your speech or vision. °· You can no longer feel a vibration or buzz when you put your fingers over your dialysis fistula. °· The limb that was used for the procedure: °? Swells. °? Is painful. °? Is cold. °? Is discolored, such as blue or pale white. °This  information is not intended to replace advice given to you by your health care provider. Make sure you discuss any questions you have with your health care provider. °Document Released: 10/20/2013 Document Revised: 11/11/2015 Document Reviewed: 07/25/2013 °Elsevier Interactive Patient Education © 2018 [employer] ° °

## 2020-07-06 ENCOUNTER — Other Ambulatory Visit (INDEPENDENT_AMBULATORY_CARE_PROVIDER_SITE_OTHER): Payer: Self-pay | Admitting: Vascular Surgery

## 2020-07-06 DIAGNOSIS — Z9862 Peripheral vascular angioplasty status: Secondary | ICD-10-CM

## 2020-07-06 DIAGNOSIS — N186 End stage renal disease: Secondary | ICD-10-CM

## 2020-07-08 ENCOUNTER — Encounter (INDEPENDENT_AMBULATORY_CARE_PROVIDER_SITE_OTHER): Payer: Self-pay | Admitting: Nurse Practitioner

## 2020-07-08 ENCOUNTER — Ambulatory Visit (INDEPENDENT_AMBULATORY_CARE_PROVIDER_SITE_OTHER): Payer: Medicare (Managed Care) | Admitting: Nurse Practitioner

## 2020-07-08 ENCOUNTER — Other Ambulatory Visit: Payer: Self-pay

## 2020-07-08 ENCOUNTER — Ambulatory Visit (INDEPENDENT_AMBULATORY_CARE_PROVIDER_SITE_OTHER): Payer: Medicare (Managed Care)

## 2020-07-08 VITALS — BP 153/54 | HR 73 | Resp 16

## 2020-07-08 DIAGNOSIS — Z992 Dependence on renal dialysis: Secondary | ICD-10-CM

## 2020-07-08 DIAGNOSIS — E785 Hyperlipidemia, unspecified: Secondary | ICD-10-CM

## 2020-07-08 DIAGNOSIS — Z9862 Peripheral vascular angioplasty status: Secondary | ICD-10-CM

## 2020-07-08 DIAGNOSIS — E1122 Type 2 diabetes mellitus with diabetic chronic kidney disease: Secondary | ICD-10-CM

## 2020-07-08 DIAGNOSIS — N186 End stage renal disease: Secondary | ICD-10-CM

## 2020-07-08 DIAGNOSIS — Z794 Long term (current) use of insulin: Secondary | ICD-10-CM

## 2020-07-11 NOTE — Progress Notes (Signed)
Subjective:    Patient ID: Amy Wu, female    DOB: 1950/08/10, 70 y.o.   MRN: [id number] Chief Complaint  Patient presents with  . Follow-up    [facility] 6 week HDA    The patient returns to the office for followup status post intervention of the dialysis access left thigh AV graft. Following the intervention the access function has significantly improved, with better flow rates and improved KT/V. The patient has not been experiencing increased bleeding times following decannulation and the patient denies increased recirculation. The patient denies an increase in arm swelling. At the present time the patient denies hand pain.  The patient denies amaurosis fugax or recent TIA symptoms. There are no recent neurological changes noted. The patient denies claudication symptoms or rest pain symptoms. The patient denies history of DVT, PE or superficial thrombophlebitis. The patient denies recent episodes of angina or shortness of breath.    Patient has a flow volume of 2150.  The flow volume is improved status post angioplasty of venous anastomosis with no evidence of significant restenosis.     Review of Systems  Hematological: Does not bruise/bleed easily.  All other systems reviewed and are negative.      Objective:   Physical Exam Vitals reviewed.  HENT:     Head: Normocephalic.  Cardiovascular:     Rate and Rhythm: Normal rate and regular rhythm.     Pulses: Normal pulses.  Pulmonary:     Effort: Pulmonary effort is normal.  Musculoskeletal:     Right Lower Extremity: Right leg is amputated above knee.     Left Lower Extremity: Left leg is amputated above knee.  Neurological:     Mental Status: She is alert and oriented to person, place, and time.     Gait: Gait abnormal.  Psychiatric:        Mood and Affect: Mood normal.        Thought Content: Thought content normal.        Judgment: Judgment normal.     BP (!) 153/54 (BP Location: Left Arm)   Pulse 73   Resp  16   Past Medical History:  Diagnosis Date  . Arthritis   . Chronic kidney disease   . Diabetes mellitus without complication ([place])   . Hyperlipidemia   . Hypertension   . Peripheral vascular disease ([place])     Social History   Socioeconomic History  . Marital status: Single    Spouse name: Not on file  . Number of children: Not on file  . Years of education: Not on file  . Highest education level: Not on file  Occupational History  . Not on file  Tobacco Use  . Smoking status: Never Smoker  . Smokeless tobacco: Never Used  Vaping Use  . Vaping Use: Never used  Substance and Sexual Activity  . Alcohol use: No  . Drug use: No  . Sexual activity: Not on file  Other Topics Concern  . Not on file  Social History Narrative  . Not on file   Social Determinants of Health   Financial Resource Strain: Not on file  Food Insecurity: Not on file  Transportation Needs: Not on file  Physical Activity: Not on file  Stress: Not on file  Social Connections: Not on file  Intimate Partner Violence: Not on file    Past Surgical History:  Procedure Laterality Date  . A/V SHUNT INTERVENTION Left 11/20/2019   Procedure: A/V SHUNT INTERVENTION (Left  Thigh);  Surgeon: Algernon Huxley, MD;  Location: [place] [facility];  Service: Cardiovascular;  Laterality: Left;  . A/V SHUNTOGRAM Left 02/16/2017   Procedure: A/V Shuntogram;  Surgeon: Algernon Huxley, MD;  Location: [place] [facility];  Service: Cardiovascular;  Laterality: Left;  . A/V SHUNTOGRAM Left 12/13/2017   Procedure: A/V SHUNTOGRAM;  Surgeon: Algernon Huxley, MD;  Location: [place] [facility];  Service: Cardiovascular;  Laterality: Left;  . A/V SHUNTOGRAM Left 12/05/2018   Procedure: A/V SHUNTOGRAM;  Surgeon: Algernon Huxley, MD;  Location: [place] [facility];  Service: Cardiovascular;  Laterality: Left;  . A/V SHUNTOGRAM Left 06/03/2020   Procedure: A/V SHUNTOGRAM;  Surgeon: Algernon Huxley, MD;  Location: [place] [facility];   Service: Cardiovascular;  Laterality: Left;  . ABOVE KNEE LEG AMPUTATION    . AV FISTULA PLACEMENT Bilateral   . AV FISTULA PLACEMENT Left 07/01/2015   Procedure: INSERTION OF ARTERIOVENOUS (AV) GRAFT THIGH (ARTEGRAFT);  Surgeon: Algernon Huxley, MD;  Location: [facility];  Service: Vascular;  Laterality: Left;  . BELOW KNEE LEG AMPUTATION Left   . ENDARTERECTOMY FEMORAL Left 07/01/2015   Procedure: SUPERFICIAL FEMORAL ENDARTERECTOMY ;  Surgeon: Algernon Huxley, MD;  Location: [facility];  Service: Vascular;  Laterality: Left;  . EXCHANGE OF A DIALYSIS CATHETER  02/11/2015   Procedure: Exchange Of A Dialysis Catheter;  Surgeon: Algernon Huxley, MD;  Location: [place] [facility];  Service: Cardiovascular;;  . LEG AMPUTATION ABOVE KNEE Right   . PERIPHERAL VASCULAR CATHETERIZATION N/A 02/11/2015   Procedure: Dialysis/Perma Catheter Insertion;  Surgeon: Algernon Huxley, MD;  Location: [place] [facility];  Service: Cardiovascular;  Laterality: N/A;  . PERIPHERAL VASCULAR CATHETERIZATION N/A 05/11/2015   Procedure: Dialysis/Perma Catheter Insertion, exchange;  Surgeon: Katha Cabal, MD;  Location: [place] [facility];  Service: Cardiovascular;  Laterality: N/A;  . PERIPHERAL VASCULAR CATHETERIZATION N/A 05/18/2015   Procedure: Dialysis/Perma Catheter Insertion;  Surgeon: Katha Cabal, MD;  Location: [place] [facility];  Service: Cardiovascular;  Laterality: N/A;  . PERIPHERAL VASCULAR CATHETERIZATION Left 07/19/2015   Procedure: A/V Shuntogram/Fistulagram;  Surgeon: Algernon Huxley, MD;  Location: [place] [facility];  Service: Cardiovascular;  Laterality: Left;  . PERIPHERAL VASCULAR CATHETERIZATION N/A 07/19/2015   Procedure: A/V Shunt Intervention;  Surgeon: Algernon Huxley, MD;  Location: [place] [facility];  Service: Cardiovascular;  Laterality: N/A;  . PERIPHERAL VASCULAR CATHETERIZATION N/A 08/26/2015   Procedure: Dialysis/Perma Catheter Removal;  Surgeon: Algernon Huxley, MD;  Location: [place] [facility];   Service: Cardiovascular;  Laterality: N/A;  . PERIPHERAL VASCULAR THROMBECTOMY N/A 12/29/2016   Procedure: Peripheral Vascular Thrombectomy;  Surgeon: Katha Cabal, MD;  Location: [place] [facility];  Service: Cardiovascular;  Laterality: N/A;    Family History  Problem Relation Age of Onset  . Breast cancer Neg Hx     Allergies  Allergen Reactions  . Ancef [Cefazolin] Palpitations  . Contrast Media [Iodinated Diagnostic Agents] Palpitations    CBC Latest Ref Rng & Units 11/19/2019 07/01/2015 06/22/2015  WBC 4.0 - 10.5 K/uL 6.8 - 9.9  Hemoglobin 12.0 - 15.0 g/dL 10.7(L) 15.3(H) 13.4  Hematocrit 36.0 - 46.0 % 32.0(L) 45.0 41.2  Platelets 150 - 400 K/uL 202 - 169      CMP     Component Value Date/Time   NA 139 11/19/2019 1002   NA 138 12/03/2012 1414   K 3.3 (L) 11/19/2019 1002   K  4.5 12/03/2012 1414   CL 93 (L) 11/19/2019 1002   CL 101 12/03/2012 1414   CO2 31 11/19/2019 1002   CO2 27 12/03/2012 1414   GLUCOSE 142 (H) 11/19/2019 1002   GLUCOSE 326 (H) 12/03/2012 1414   BUN 21 11/19/2019 1002   BUN 37 (H) 12/03/2012 1414   CREATININE 5.11 (H) 11/19/2019 1002   CREATININE 7.04 (H) 12/03/2012 1414   CALCIUM 8.2 (L) 11/19/2019 1002   CALCIUM 8.4 (L) 12/03/2012 1414   PROT 6.9 12/28/2011 1746   ALBUMIN 2.7 (L) 12/28/2011 1746   AST 12 (L) 12/28/2011 1746   ALT 7 (L) 12/28/2011 1746   ALKPHOS 154 (H) 12/28/2011 1746   BILITOT 0.3 12/28/2011 1746   GFRNONAA 8 (L) 11/19/2019 1002   GFRNONAA 6 (L) 12/03/2012 1414   GFRAA 9 (L) 11/19/2019 1002   GFRAA 7 (L) 12/03/2012 1414     No results found.     Assessment & Plan:   1. ESRD (end stage renal disease) ([place]) Recommend:  The patient is doing well and currently has adequate dialysis access. The patient's dialysis center is not reporting any access issues. Flow pattern is stable when compared to the prior ultrasound.  The patient should have a duplex ultrasound of the dialysis access in 6 months. The patient  will follow-up with me in the office after each ultrasound     2. Hyperlipidemia, unspecified hyperlipidemia type Continue statin as ordered and reviewed, no changes at this time   3. Type 2 diabetes mellitus with chronic kidney disease on chronic dialysis, with long-term current use of insulin (HCC) Continue hypoglycemic medications as already ordered, these medications have been reviewed and there are no changes at this time.  Hgb A1C to be monitored as already arranged by primary service      Current Outpatient Medications on File Prior to Visit  Medication Sig Dispense Refill  . acetaminophen (TYLENOL) 500 MG tablet Take 1,000 mg by mouth every 8 (eight) hours as needed for moderate pain.    Marland Kitchen aspirin 81 MG tablet Take 81 mg by mouth daily.    Marland Kitchen atorvastatin (LIPITOR) 40 MG tablet Take 40 mg by mouth daily.    . benzonatate (TESSALON) 100 MG capsule Take by mouth 3 (three) times daily as needed for cough.    . calcitRIOL (ROCALTROL) 0.25 MCG capsule Take 0.25 mcg by mouth daily. Take 3 capsules by mouth three times week at dialysis    . camphor-menthol (SARNA) lotion Apply 1 application topically as needed for itching. Twice a day if needed    . Cholecalciferol (VITAMIN D3) 2000 units TABS Take 2,000 Units by mouth daily.    . cinacalcet (SENSIPAR) 30 MG tablet Take 30 mg by mouth daily with supper.     . Dextromethorphan-guaiFENesin (MUCINEX DM) 30-600 MG TB12 Take by mouth.    . ferrous sulfate 325 (65 FE) MG tablet Take 325 mg by mouth every other day.    . fluticasone (FLONASE) 50 MCG/ACT nasal spray Place into both nostrils daily.    Marland Kitchen gabapentin (NEURONTIN) 100 MG capsule Take 100 mg by mouth 3 (three) times daily. M/W/F after dialysis    . glucose 4 GM chewable tablet Chew 1 tablet by mouth as needed for low blood sugar. If blood sugar less than 70. Chew 3 tablets and recheck blood sugar in 15 minutes    . glucose blood test strip 1 each by Other route as needed for other.  Use as instructed    .  ketotifen (ZADITOR) 0.025 % ophthalmic solution Place 1 drop into both eyes 2 (two) times daily.    Marland Kitchen lanthanum (FOSRENOL) 1000 MG chewable tablet Chew 1,000 mg by mouth 2 (two) times daily with a meal.     . loperamide (IMODIUM A-D) 2 MG tablet Take 2 mg by mouth 4 (four) times daily as needed for diarrhea or loose stools. 2 tablets by mouth for onset of loose stool, than 1 tablet by mouth after each loose stool MAX 4 tablets in 24 hours    . loratadine (CLARITIN) 10 MG tablet Take 10 mg by mouth daily as needed for allergies.    . polyethylene glycol (MIRALAX / GLYCOLAX) packet Take 17 g by mouth daily as needed for moderate constipation or severe constipation.    Marland Kitchen RA VITAMINS A & D EX Apply topically as needed.    Marland Kitchen rOPINIRole (REQUIP) 0.25 MG tablet Take 0.25 mg by mouth at bedtime as needed.    . senna-docusate (SENOKOT-S) 8.6-50 MG tablet Take 1-2 tablets by mouth at bedtime as needed for mild constipation or moderate constipation. 1-2 tablets at bedtime if needed    . Simethicone 180 MG CAPS Take by mouth.    . Skin Protectants, Misc. (EUCERIN) cream Apply 1 application topically 4 (four) times daily as needed for dry skin.     . Zinc Oxide 13 % CREA Apply 1 application topically as needed for irritation.    . insulin aspart (NOVOLOG) 100 UNIT/ML injection Inject 1 Units into the skin See admin instructions. Pt uses insulin PRN for Blood Sugar greater then 200 uses 1 unit if greater then 200  (Patient not taking: No sig reported)     No current facility-administered medications on file prior to visit.    There are no Patient Instructions on file for this visit. No follow-ups on file.   Kris Hartmann, NP

## 2020-07-12 ENCOUNTER — Encounter (INDEPENDENT_AMBULATORY_CARE_PROVIDER_SITE_OTHER): Payer: Self-pay | Admitting: Nurse Practitioner

## 2020-07-23 ENCOUNTER — Other Ambulatory Visit: Payer: Self-pay | Admitting: Family

## 2020-07-23 DIAGNOSIS — Z1231 Encounter for screening mammogram for malignant neoplasm of breast: Secondary | ICD-10-CM

## 2020-07-30 ENCOUNTER — Ambulatory Visit
Admission: RE | Admit: 2020-07-30 | Discharge: 2020-07-30 | Disposition: A | Discharge status: Home / Self Care | Payer: Medicare (Managed Care) | Source: Ambulatory Visit | Attending: Family | Admitting: Family

## 2020-07-30 ENCOUNTER — Other Ambulatory Visit: Payer: Self-pay

## 2020-07-30 ENCOUNTER — Other Ambulatory Visit: Payer: Self-pay | Admitting: Family

## 2020-07-30 DIAGNOSIS — R634 Abnormal weight loss: Secondary | ICD-10-CM

## 2020-08-16 ENCOUNTER — Ambulatory Visit
Admission: RE | Admit: 2020-08-16 | Discharge: 2020-08-16 | Disposition: A | Discharge status: Home / Self Care | Payer: Medicare (Managed Care) | Source: Ambulatory Visit | Attending: Family | Admitting: Family

## 2020-08-16 ENCOUNTER — Other Ambulatory Visit: Payer: Self-pay

## 2020-08-16 DIAGNOSIS — Z1231 Encounter for screening mammogram for malignant neoplasm of breast: Secondary | ICD-10-CM | POA: Insufficient documentation

## 2020-12-28 ENCOUNTER — Other Ambulatory Visit (INDEPENDENT_AMBULATORY_CARE_PROVIDER_SITE_OTHER): Payer: Self-pay | Admitting: Nurse Practitioner

## 2020-12-28 DIAGNOSIS — N186 End stage renal disease: Secondary | ICD-10-CM

## 2020-12-30 ENCOUNTER — Ambulatory Visit (INDEPENDENT_AMBULATORY_CARE_PROVIDER_SITE_OTHER): Payer: Medicare (Managed Care) | Admitting: Nurse Practitioner

## 2020-12-30 ENCOUNTER — Encounter (INDEPENDENT_AMBULATORY_CARE_PROVIDER_SITE_OTHER): Payer: Medicare (Managed Care)

## 2021-01-17 ENCOUNTER — Ambulatory Visit (INDEPENDENT_AMBULATORY_CARE_PROVIDER_SITE_OTHER): Payer: Medicare (Managed Care)

## 2021-01-17 ENCOUNTER — Other Ambulatory Visit: Payer: Self-pay

## 2021-01-17 ENCOUNTER — Ambulatory Visit (INDEPENDENT_AMBULATORY_CARE_PROVIDER_SITE_OTHER): Payer: Medicare (Managed Care) | Admitting: Nurse Practitioner

## 2021-01-17 ENCOUNTER — Encounter (INDEPENDENT_AMBULATORY_CARE_PROVIDER_SITE_OTHER): Payer: Self-pay | Admitting: Nurse Practitioner

## 2021-01-17 VITALS — BP 110/70 | HR 68 | Ht <= 58 in | Wt 203.0 lb

## 2021-01-17 DIAGNOSIS — E1122 Type 2 diabetes mellitus with diabetic chronic kidney disease: Secondary | ICD-10-CM

## 2021-01-17 DIAGNOSIS — E785 Hyperlipidemia, unspecified: Secondary | ICD-10-CM | POA: Diagnosis not present

## 2021-01-17 DIAGNOSIS — N186 End stage renal disease: Secondary | ICD-10-CM

## 2021-01-17 DIAGNOSIS — Z992 Dependence on renal dialysis: Secondary | ICD-10-CM | POA: Diagnosis not present

## 2021-01-17 DIAGNOSIS — Z794 Long term (current) use of insulin: Secondary | ICD-10-CM

## 2021-01-17 NOTE — Progress Notes (Signed)
Subjective:    Patient ID: Amy Wu, female    DOB: [birth year], 70 y.o.   MRN: [id number] Chief Complaint  Patient presents with   Follow-up    6 Mo [place]    The patient returns to the office for followup of their dialysis access. The function of the access has been stable. The patient denies increased bleeding time or increased recirculation. Patient denies difficulty with cannulation. The patient denies hand pain or other symptoms consistent with steal phenomena.  No significant leg swelling.  The patient denies redness or swelling at the access site. The patient denies fever or chills at home or while on dialysis.  The patient denies amaurosis fugax or recent TIA symptoms. There are no recent neurological changes noted. The patient denies claudication symptoms or rest pain symptoms. The patient denies history of DVT, PE or superficial thrombophlebitis. The patient denies recent episodes of angina or shortness of breath.    Today noninvasive studies show flow volume of 3485.  Thigh AV graft is patent.  Velocities are increased at the arterial anastomosis however the velocities throughout the graft are also increased.  No evidence of significant stenosis.      Review of Systems  Hematological:  Does not bruise/bleed easily.  All other systems reviewed and are negative.     Objective:   Physical Exam Vitals reviewed.  HENT:     Head: Normocephalic.  Cardiovascular:     Rate and Rhythm: Normal rate.  Pulmonary:     Effort: Pulmonary effort is normal.  Musculoskeletal:     Right Lower Extremity: Right leg is amputated above knee.     Left Lower Extremity: Left leg is amputated below knee.  Skin:    General: Skin is warm and dry.  Neurological:     Mental Status: She is alert and oriented to person, place, and time.  Psychiatric:        Mood and Affect: Mood normal.        Behavior: Behavior normal.        Thought Content: Thought content normal.        Judgment:  Judgment normal.    BP 110/70   Pulse 68   Ht '4\' 10"'$  (1.473 m)   Wt 203 lb (92.1 kg)   BMI 42.43 kg/m   Past Medical History:  Diagnosis Date   Arthritis    Chronic kidney disease    Diabetes mellitus without complication (HCC)    Hyperlipidemia    Hypertension    Peripheral vascular disease ([facility])     Social History   Socioeconomic History   Marital status: Single    Spouse name: Not on file   Number of children: Not on file   Years of education: Not on file   Highest education level: Not on file  Occupational History   Not on file  Tobacco Use   Smoking status: Never   Smokeless tobacco: Never  Vaping Use   Vaping Use: Never used  Substance and Sexual Activity   Alcohol use: No   Drug use: No   Sexual activity: Not on file  Other Topics Concern   Not on file  Social History Narrative   Not on file   Social Determinants of Health   Financial Resource Strain: Not on file  Food Insecurity: Not on file  Transportation Needs: Not on file  Physical Activity: Not on file  Stress: Not on file  Social Connections: Not on file  Intimate Partner  Violence: Not on file    Past Surgical History:  Procedure Laterality Date   A/V SHUNT INTERVENTION Left 11/20/2019   Procedure: A/V SHUNT INTERVENTION (Left Thigh);  Surgeon: Algernon Huxley, MD;  Location: [place] [facility];  Service: Cardiovascular;  Laterality: Left;   A/V SHUNTOGRAM Left 02/16/2017   Procedure: A/V Shuntogram;  Surgeon: Algernon Huxley, MD;  Location: [place] [facility];  Service: Cardiovascular;  Laterality: Left;   A/V SHUNTOGRAM Left 12/13/2017   Procedure: A/V SHUNTOGRAM;  Surgeon: Algernon Huxley, MD;  Location: [place] [facility];  Service: Cardiovascular;  Laterality: Left;   A/V SHUNTOGRAM Left 12/05/2018   Procedure: A/V SHUNTOGRAM;  Surgeon: Algernon Huxley, MD;  Location: [place] [facility];  Service: Cardiovascular;  Laterality: Left;   A/V SHUNTOGRAM Left 06/03/2020   Procedure: A/V  SHUNTOGRAM;  Surgeon: Algernon Huxley, MD;  Location: [place] [facility];  Service: Cardiovascular;  Laterality: Left;   ABOVE KNEE LEG AMPUTATION     AV FISTULA PLACEMENT Bilateral    AV FISTULA PLACEMENT Left 07/01/2015   Procedure: INSERTION OF ARTERIOVENOUS (AV) GRAFT THIGH (ARTEGRAFT);  Surgeon: Algernon Huxley, MD;  Location: [facility];  Service: Vascular;  Laterality: Left;   BELOW KNEE LEG AMPUTATION Left    BREAST BIOPSY     ENDARTERECTOMY FEMORAL Left 07/01/2015   Procedure: SUPERFICIAL FEMORAL ENDARTERECTOMY ;  Surgeon: Algernon Huxley, MD;  Location: [facility];  Service: Vascular;  Laterality: Left;   EXCHANGE OF A DIALYSIS CATHETER  02/11/2015   Procedure: Exchange Of A Dialysis Catheter;  Surgeon: Algernon Huxley, MD;  Location: [place] [facility];  Service: Cardiovascular;;   LEG AMPUTATION ABOVE KNEE Right    PERIPHERAL VASCULAR CATHETERIZATION N/A 02/11/2015   Procedure: Dialysis/Perma Catheter Insertion;  Surgeon: Algernon Huxley, MD;  Location: [place] [facility];  Service: Cardiovascular;  Laterality: N/A;   PERIPHERAL VASCULAR CATHETERIZATION N/A 05/11/2015   Procedure: Dialysis/Perma Catheter Insertion, exchange;  Surgeon: Katha Cabal, MD;  Location: [place] [facility];  Service: Cardiovascular;  Laterality: N/A;   PERIPHERAL VASCULAR CATHETERIZATION N/A 05/18/2015   Procedure: Dialysis/Perma Catheter Insertion;  Surgeon: Katha Cabal, MD;  Location: [place] [facility];  Service: Cardiovascular;  Laterality: N/A;   PERIPHERAL VASCULAR CATHETERIZATION Left 07/19/2015   Procedure: A/V Shuntogram/Fistulagram;  Surgeon: Algernon Huxley, MD;  Location: [place] [facility];  Service: Cardiovascular;  Laterality: Left;   PERIPHERAL VASCULAR CATHETERIZATION N/A 07/19/2015   Procedure: A/V Shunt Intervention;  Surgeon: Algernon Huxley, MD;  Location: [place] [facility];  Service: Cardiovascular;  Laterality: N/A;   PERIPHERAL VASCULAR CATHETERIZATION N/A 08/26/2015   Procedure: Dialysis/Perma  Catheter Removal;  Surgeon: Algernon Huxley, MD;  Location: [place] [facility];  Service: Cardiovascular;  Laterality: N/A;   PERIPHERAL VASCULAR THROMBECTOMY N/A 12/29/2016   Procedure: Peripheral Vascular Thrombectomy;  Surgeon: Katha Cabal, MD;  Location: [place] [facility];  Service: Cardiovascular;  Laterality: N/A;    Family History  Problem Relation Age of Onset   Breast cancer Neg Hx     Allergies  Allergen Reactions   Ancef [Cefazolin] Palpitations   Contrast Media [Iodinated Diagnostic Agents] Palpitations    CBC Latest Ref Rng & Units 11/19/2019 07/01/2015 06/22/2015  WBC 4.0 - 10.5 K/uL 6.8 - 9.9  Hemoglobin 12.0 - 15.0 g/dL 10.7(L) 15.3(H) 13.4  Hematocrit 36.0 - 46.0 % 32.0(L) 45.0 41.2  Platelets 150 - 400 K/uL 202 - 169  CMP     Component Value Date/Time   NA 139 11/19/2019 1002   NA 138 12/03/2012 1414   K 3.3 (L) 11/19/2019 1002   K 4.5 12/03/2012 1414   CL 93 (L) 11/19/2019 1002   CL 101 12/03/2012 1414   CO2 31 11/19/2019 1002   CO2 27 12/03/2012 1414   GLUCOSE 142 (H) 11/19/2019 1002   GLUCOSE 326 (H) 12/03/2012 1414   BUN 21 11/19/2019 1002   BUN 37 (H) 12/03/2012 1414   CREATININE 5.11 (H) 11/19/2019 1002   CREATININE 7.04 (H) 12/03/2012 1414   CALCIUM 8.2 (L) 11/19/2019 1002   CALCIUM 8.4 (L) 12/03/2012 1414   PROT 6.9 12/28/2011 1746   ALBUMIN 2.7 (L) 12/28/2011 1746   AST 12 (L) 12/28/2011 1746   ALT 7 (L) 12/28/2011 1746   ALKPHOS 154 (H) 12/28/2011 1746   BILITOT 0.3 12/28/2011 1746   GFRNONAA 8 (L) 11/19/2019 1002   GFRNONAA 6 (L) 12/03/2012 1414   GFRAA 9 (L) 11/19/2019 1002   GFRAA 7 (L) 12/03/2012 1414     No results found.     Assessment & Plan:   1. ESRD (end stage renal disease) ([place]) Recommend:  The patient is doing well and currently has adequate dialysis access. The patient's dialysis center is not reporting any access issues. Flow pattern is stable when compared to the prior ultrasound.  The patient  should have a duplex ultrasound of the dialysis access in 6 months. The patient will follow-up with me in the office after each ultrasound     2. Hyperlipidemia, unspecified hyperlipidemia type Continue statin as ordered and reviewed, no changes at this time   3. Type 2 diabetes mellitus with chronic kidney disease on chronic dialysis, with long-term current use of insulin (HCC) Continue hypoglycemic medications as already ordered, these medications have been reviewed and there are no changes at this time.  Hgb A1C to be monitored as already arranged by primary service    Current Outpatient Medications on File Prior to Visit  Medication Sig Dispense Refill   acetaminophen (TYLENOL) 500 MG tablet Take 1,000 mg by mouth every 8 (eight) hours as needed for moderate pain.     aspirin 81 MG EC tablet Take by mouth.     aspirin 81 MG tablet Take 81 mg by mouth daily.     aspirin EC 81 MG tablet Take 81 mg by mouth daily.     atorvastatin (LIPITOR) 40 MG tablet Take 40 mg by mouth daily.     atorvastatin (LIPITOR) 40 MG tablet Take by mouth.     benzonatate (TESSALON) 100 MG capsule Take by mouth 3 (three) times daily as needed for cough.     calcitRIOL (ROCALTROL) 0.25 MCG capsule Take 0.25 mcg by mouth daily. Take 3 capsules by mouth three times week at dialysis     camphor-menthol [facility]) lotion Apply 1 application topically as needed for itching. Twice a day if needed     Cholecalciferol (VITAMIN D3) 2000 units TABS Take 2,000 Units by mouth daily.     cinacalcet (SENSIPAR) 30 MG tablet Take 30 mg by mouth daily with supper.      Dextromethorphan-guaiFENesin (MUCINEX DM) 30-600 MG TB12 Take by mouth.     diphenhydrAMINE (BENADRYL) 12.5 MG/5ML elixir Take by mouth.     ferrous sulfate 325 (65 FE) MG tablet Take 325 mg by mouth every other day.     fluticasone (FLONASE) 50 MCG/ACT nasal spray Place into both nostrils daily.  gabapentin (NEURONTIN) 100 MG capsule Take 100 mg by mouth 3  (three) times daily. M/W/F after dialysis     glucose 4 GM chewable tablet Chew 1 tablet by mouth as needed for low blood sugar. If blood sugar less than 70. Chew 3 tablets and recheck blood sugar in 15 minutes     glucose blood test strip 1 each by Other route as needed for other. Use as instructed     Heparin, Porcine, in NaCl 1000-0.9 UT/500ML-% SOLN Inject into the skin.     insulin aspart (NOVOLOG) 100 UNIT/ML injection Inject 1 Units into the skin See admin instructions. Pt uses insulin PRN for Blood Sugar greater then 200 uses 1 unit if greater then 200     JANUVIA 25 MG tablet Take 25 mg by mouth daily.     ketotifen (ZADITOR) 0.025 % ophthalmic solution Place 1 drop into both eyes 2 (two) times daily.     lanthanum (FOSRENOL) 1000 MG chewable tablet Chew 1,000 mg by mouth 2 (two) times daily with a meal.      loperamide (IMODIUM A-D) 2 MG tablet Take 2 mg by mouth 4 (four) times daily as needed for diarrhea or loose stools. 2 tablets by mouth for onset of loose stool, than 1 tablet by mouth after each loose stool MAX 4 tablets in 24 hours     loratadine (CLARITIN) 10 MG tablet Take 10 mg by mouth daily as needed for allergies.     Methoxy PEG-Epoetin Beta (MIRCERA IJ) Mircera     Methoxy PEG-Epoetin Beta (MIRCERA IJ) Mircera     NYSTATIN powder SMARTSIG:Sparingly Topical Daily PRN     polyethylene glycol (MIRALAX / GLYCOLAX) packet Take 17 g by mouth daily as needed for moderate constipation or severe constipation.     RA VITAMINS A & D EX Apply topically as needed.     rOPINIRole (REQUIP) 0.25 MG tablet Take 0.25 mg by mouth at bedtime as needed.     senna-docusate (SENOKOT-S) 8.6-50 MG tablet Take 1-2 tablets by mouth at bedtime as needed for mild constipation or moderate constipation. 1-2 tablets at bedtime if needed     Simethicone 180 MG CAPS Take by mouth.     Skin Protectants, Misc. (EUCERIN) cream Apply 1 application topically 4 (four) times daily as needed for dry skin.       Vitamins A & D (VITAMIN A & D) ointment Apply topically daily as needed.     Zinc Oxide 13 % CREA Apply 1 application topically as needed for irritation.     No current facility-administered medications on file prior to visit.    There are no Patient Instructions on file for this visit. No follow-ups on file.   Kris Hartmann, NP

## 2021-05-04 ENCOUNTER — Other Ambulatory Visit: Payer: Self-pay | Admitting: Family Medicine

## 2021-05-04 DIAGNOSIS — Z1231 Encounter for screening mammogram for malignant neoplasm of breast: Secondary | ICD-10-CM

## 2021-05-23 ENCOUNTER — Inpatient Hospital Stay: Payer: Medicare (Managed Care)

## 2021-05-23 ENCOUNTER — Inpatient Hospital Stay
Admission: EM | Admit: 2021-05-23 | Discharge: 2021-05-26 | DRG: 252 | Disposition: A | Discharge status: Home / Self Care | Payer: Medicare (Managed Care) | Attending: Obstetrics and Gynecology | Admitting: Obstetrics and Gynecology

## 2021-05-23 ENCOUNTER — Other Ambulatory Visit (INDEPENDENT_AMBULATORY_CARE_PROVIDER_SITE_OTHER): Payer: Self-pay | Admitting: Vascular Surgery

## 2021-05-23 DIAGNOSIS — Z89512 Acquired absence of left leg below knee: Secondary | ICD-10-CM | POA: Diagnosis not present

## 2021-05-23 DIAGNOSIS — E1122 Type 2 diabetes mellitus with diabetic chronic kidney disease: Secondary | ICD-10-CM | POA: Diagnosis present

## 2021-05-23 DIAGNOSIS — N2581 Secondary hyperparathyroidism of renal origin: Secondary | ICD-10-CM | POA: Diagnosis present

## 2021-05-23 DIAGNOSIS — E785 Hyperlipidemia, unspecified: Secondary | ICD-10-CM | POA: Diagnosis present

## 2021-05-23 DIAGNOSIS — E1142 Type 2 diabetes mellitus with diabetic polyneuropathy: Secondary | ICD-10-CM | POA: Diagnosis present

## 2021-05-23 DIAGNOSIS — E1151 Type 2 diabetes mellitus with diabetic peripheral angiopathy without gangrene: Secondary | ICD-10-CM | POA: Diagnosis present

## 2021-05-23 DIAGNOSIS — D62 Acute posthemorrhagic anemia: Secondary | ICD-10-CM | POA: Diagnosis present

## 2021-05-23 DIAGNOSIS — G9340 Encephalopathy, unspecified: Secondary | ICD-10-CM | POA: Diagnosis present

## 2021-05-23 DIAGNOSIS — Y712 Prosthetic and other implants, materials and accessory cardiovascular devices associated with adverse incidents: Secondary | ICD-10-CM | POA: Diagnosis present

## 2021-05-23 DIAGNOSIS — I444 Left anterior fascicular block: Secondary | ICD-10-CM | POA: Diagnosis present

## 2021-05-23 DIAGNOSIS — D631 Anemia in chronic kidney disease: Secondary | ICD-10-CM | POA: Diagnosis present

## 2021-05-23 DIAGNOSIS — Z992 Dependence on renal dialysis: Secondary | ICD-10-CM | POA: Diagnosis not present

## 2021-05-23 DIAGNOSIS — Z7982 Long term (current) use of aspirin: Secondary | ICD-10-CM | POA: Diagnosis not present

## 2021-05-23 DIAGNOSIS — G2581 Restless legs syndrome: Secondary | ICD-10-CM | POA: Diagnosis present

## 2021-05-23 DIAGNOSIS — T82838A Hemorrhage of vascular prosthetic devices, implants and grafts, initial encounter: Principal | ICD-10-CM

## 2021-05-23 DIAGNOSIS — Z794 Long term (current) use of insulin: Secondary | ICD-10-CM

## 2021-05-23 DIAGNOSIS — Z20822 Contact with and (suspected) exposure to covid-19: Secondary | ICD-10-CM | POA: Diagnosis present

## 2021-05-23 DIAGNOSIS — M1990 Unspecified osteoarthritis, unspecified site: Secondary | ICD-10-CM | POA: Diagnosis present

## 2021-05-23 DIAGNOSIS — T82898A Other specified complication of vascular prosthetic devices, implants and grafts, initial encounter: Secondary | ICD-10-CM | POA: Diagnosis not present

## 2021-05-23 DIAGNOSIS — N186 End stage renal disease: Secondary | ICD-10-CM | POA: Diagnosis present

## 2021-05-23 DIAGNOSIS — Z91041 Radiographic dye allergy status: Secondary | ICD-10-CM

## 2021-05-23 DIAGNOSIS — Z79899 Other long term (current) drug therapy: Secondary | ICD-10-CM

## 2021-05-23 DIAGNOSIS — E1143 Type 2 diabetes mellitus with diabetic autonomic (poly)neuropathy: Secondary | ICD-10-CM | POA: Diagnosis not present

## 2021-05-23 DIAGNOSIS — R0602 Shortness of breath: Secondary | ICD-10-CM

## 2021-05-23 DIAGNOSIS — Z7984 Long term (current) use of oral hypoglycemic drugs: Secondary | ICD-10-CM

## 2021-05-23 DIAGNOSIS — N185 Chronic kidney disease, stage 5: Secondary | ICD-10-CM | POA: Diagnosis not present

## 2021-05-23 DIAGNOSIS — K8020 Calculus of gallbladder without cholecystitis without obstruction: Secondary | ICD-10-CM

## 2021-05-23 DIAGNOSIS — I959 Hypotension, unspecified: Secondary | ICD-10-CM | POA: Diagnosis present

## 2021-05-23 DIAGNOSIS — Z881 Allergy status to other antibiotic agents status: Secondary | ICD-10-CM

## 2021-05-23 DIAGNOSIS — I120 Hypertensive chronic kidney disease with stage 5 chronic kidney disease or end stage renal disease: Secondary | ICD-10-CM | POA: Diagnosis present

## 2021-05-23 LAB — CBC WITH DIFFERENTIAL/PLATELET
Abs Immature Granulocytes: 0.02 10*3/uL (ref 0.00–0.07)
Basophils Absolute: 0 10*3/uL (ref 0.0–0.1)
Basophils Relative: 1 %
Eosinophils Absolute: 0.6 10*3/uL — ABNORMAL HIGH (ref 0.0–0.5)
Eosinophils Relative: 7 %
HCT: 36.6 % (ref 36.0–46.0)
Hemoglobin: 11.9 g/dL — ABNORMAL LOW (ref 12.0–15.0)
Immature Granulocytes: 0 %
Lymphocytes Relative: 26 %
Lymphs Abs: 2.2 10*3/uL (ref 0.7–4.0)
MCH: 26.4 pg (ref 26.0–34.0)
MCHC: 32.5 g/dL (ref 30.0–36.0)
MCV: 81.2 fL (ref 80.0–100.0)
Monocytes Absolute: 0.8 10*3/uL (ref 0.1–1.0)
Monocytes Relative: 9 %
Neutro Abs: 5.1 10*3/uL (ref 1.7–7.7)
Neutrophils Relative %: 57 %
Platelets: 226 10*3/uL (ref 150–400)
RBC: 4.51 MIL/uL (ref 3.87–5.11)
RDW: 16.3 % — ABNORMAL HIGH (ref 11.5–15.5)
WBC: 8.7 10*3/uL (ref 4.0–10.5)
nRBC: 0 % (ref 0.0–0.2)

## 2021-05-23 LAB — RESP PANEL BY RT-PCR (FLU A&B, COVID) ARPGX2
Influenza A by PCR: NEGATIVE
Influenza B by PCR: NEGATIVE
SARS Coronavirus 2 by RT PCR: NEGATIVE

## 2021-05-23 LAB — HEMOGLOBIN A1C
Hgb A1c MFr Bld: 7.3 % — ABNORMAL HIGH (ref 4.8–5.6)
Mean Plasma Glucose: 163 mg/dL

## 2021-05-23 LAB — PROTIME-INR
INR: 1.1 (ref 0.8–1.2)
Prothrombin Time: 13.7 seconds (ref 11.4–15.2)

## 2021-05-23 LAB — BASIC METABOLIC PANEL
Anion gap: 11 (ref 5–15)
BUN: 43 mg/dL — ABNORMAL HIGH (ref 8–23)
CO2: 30 mmol/L (ref 22–32)
Calcium: 9 mg/dL (ref 8.9–10.3)
Chloride: 97 mmol/L — ABNORMAL LOW (ref 98–111)
Creatinine, Ser: 9.64 mg/dL — ABNORMAL HIGH (ref 0.44–1.00)
GFR, Estimated: 4 mL/min — ABNORMAL LOW (ref >60)
Glucose, Bld: 210 mg/dL — ABNORMAL HIGH (ref 70–99)
Potassium: 4.2 mmol/L (ref 3.5–5.1)
Sodium: 138 mmol/L (ref 135–145)

## 2021-05-23 LAB — TYPE AND SCREEN
ABO/RH(D): O POS
Antibody Screen: NEGATIVE

## 2021-05-23 LAB — CBG MONITORING, ED
Glucose-Capillary: 164 mg/dL — ABNORMAL HIGH (ref 70–99)
Glucose-Capillary: 115 mg/dL — ABNORMAL HIGH (ref 70–99)
Glucose-Capillary: 154 mg/dL — ABNORMAL HIGH (ref 70–99)
Glucose-Capillary: 276 mg/dL — ABNORMAL HIGH (ref 70–99)
Glucose-Capillary: 265 mg/dL — ABNORMAL HIGH (ref 70–99)

## 2021-05-23 LAB — TROPONIN I (HIGH SENSITIVITY): Troponin I (High Sensitivity): 24 ng/L — ABNORMAL HIGH (ref <18)

## 2021-05-23 LAB — APTT: aPTT: 32 seconds (ref 24–36)

## 2021-05-23 LAB — HIV ANTIBODY (ROUTINE TESTING W REFLEX): HIV Screen 4th Generation wRfx: NONREACTIVE

## 2021-05-23 MED ORDER — SODIUM CHLORIDE 0.9 % IV SOLN: 250.0000 mL | INTRAVENOUS | Status: DC | PRN

## 2021-05-23 MED ORDER — CINACALCET HCL 30 MG PO TABS
30.0000 mg | ORAL_TABLET | Freq: Every day | ORAL | Status: DC
  Administered 2021-05-23 – 2021-05-25 (×2): 30 mg via ORAL
  Filled 2021-05-23 (×4): qty 1

## 2021-05-23 MED ORDER — GABAPENTIN 100 MG PO CAPS
100.0000 mg | ORAL_CAPSULE | ORAL | Status: DC
  Administered 2021-05-23 – 2021-05-25 (×2): 100 mg via ORAL
  Filled 2021-05-23 (×2): qty 1

## 2021-05-23 MED ORDER — SODIUM CHLORIDE 0.9% FLUSH: 3.0000 mL | INTRAVENOUS | Status: DC | PRN

## 2021-05-23 MED ORDER — BENZONATATE 100 MG PO CAPS: 100.0000 mg | ORAL_CAPSULE | Freq: Three times a day (TID) | ORAL | Status: DC | PRN

## 2021-05-23 MED ORDER — CAMPHOR-MENTHOL 0.5-0.5 % EX LOTN
1.0000 "application " | TOPICAL_LOTION | Freq: Two times a day (BID) | CUTANEOUS | Status: DC | PRN
  Filled 2021-05-23: qty 222

## 2021-05-23 MED ORDER — ROPINIROLE HCL 0.25 MG PO TABS
0.2500 mg | ORAL_TABLET | Freq: Every evening | ORAL | Status: DC | PRN
  Filled 2021-05-23 (×2): qty 1

## 2021-05-23 MED ORDER — SODIUM CHLORIDE 0.9 % IV BOLUS
500.0000 mL | Freq: Once | INTRAVENOUS | Status: AC
  Administered 2021-05-23: 500 mL via INTRAVENOUS

## 2021-05-23 MED ORDER — CHLORHEXIDINE GLUCONATE CLOTH 2 % EX PADS
6.0000 | MEDICATED_PAD | Freq: Every day | CUTANEOUS | Status: DC
  Administered 2021-05-24 – 2021-05-26 (×3): 6 via TOPICAL
  Filled 2021-05-23: qty 6

## 2021-05-23 MED ORDER — SODIUM CHLORIDE 0.9% FLUSH
3.0000 mL | Freq: Two times a day (BID) | INTRAVENOUS | Status: DC
  Administered 2021-05-23 – 2021-05-25 (×6): 3 mL via INTRAVENOUS

## 2021-05-23 MED ORDER — SIMETHICONE 80 MG PO CHEW
80.0000 mg | CHEWABLE_TABLET | Freq: Four times a day (QID) | ORAL | Status: DC | PRN
  Filled 2021-05-23: qty 1

## 2021-05-23 MED ORDER — FERROUS SULFATE 325 (65 FE) MG PO TABS
325.0000 mg | ORAL_TABLET | ORAL | Status: DC
  Administered 2021-05-23 – 2021-05-25 (×2): 325 mg via ORAL
  Filled 2021-05-23 (×3): qty 1

## 2021-05-23 MED ORDER — LANTHANUM CARBONATE 500 MG PO CHEW
1000.0000 mg | CHEWABLE_TABLET | Freq: Two times a day (BID) | ORAL | Status: DC
  Administered 2021-05-23 – 2021-05-26 (×6): 1000 mg via ORAL
  Filled 2021-05-23 (×9): qty 2

## 2021-05-23 MED ORDER — VITAMIN D3 25 MCG (1000 UNIT) PO TABS
2000.0000 [IU] | ORAL_TABLET | Freq: Every day | ORAL | Status: DC
  Administered 2021-05-23 – 2021-05-26 (×3): 2000 [IU] via ORAL
  Filled 2021-05-23 (×7): qty 2

## 2021-05-23 MED ORDER — INSULIN ASPART 100 UNIT/ML IJ SOLN
0.0000 [IU] | Freq: Three times a day (TID) | INTRAMUSCULAR | Status: DC
Start: 2021-05-23 — End: 2021-05-26
  Administered 2021-05-23: 2 [IU] via SUBCUTANEOUS
  Administered 2021-05-23: 23:00:00 5 [IU] via SUBCUTANEOUS
  Administered 2021-05-24: 2 [IU] via SUBCUTANEOUS
  Administered 2021-05-24: 22:00:00 7 [IU] via SUBCUTANEOUS
  Administered 2021-05-25: 9 [IU] via SUBCUTANEOUS
  Administered 2021-05-25: 7 [IU] via SUBCUTANEOUS
  Administered 2021-05-25 – 2021-05-26 (×2): 2 [IU] via SUBCUTANEOUS
  Filled 2021-05-23 (×8): qty 1

## 2021-05-23 MED ORDER — POLYETHYLENE GLYCOL 3350 17 G PO PACK: 17.0000 g | PACK | Freq: Every day | ORAL | Status: DC | PRN

## 2021-05-23 MED ORDER — GLUCOSE 4 G PO CHEW
1.0000 | CHEWABLE_TABLET | ORAL | Status: DC | PRN
  Filled 2021-05-23: qty 1

## 2021-05-23 MED ORDER — VITAMINS A & D EX OINT
TOPICAL_OINTMENT | Freq: Every day | CUTANEOUS | Status: DC | PRN
  Filled 2021-05-23: qty 113

## 2021-05-23 MED ORDER — TRAZODONE HCL 50 MG PO TABS: 25.0000 mg | ORAL_TABLET | Freq: Every evening | ORAL | Status: DC | PRN

## 2021-05-23 MED ORDER — HYDROCERIN EX CREA
1.0000 "application " | TOPICAL_CREAM | Freq: Four times a day (QID) | CUTANEOUS | Status: DC | PRN
  Filled 2021-05-23: qty 113

## 2021-05-23 MED ORDER — FLUTICASONE PROPIONATE 50 MCG/ACT NA SUSP
2.0000 | Freq: Every day | NASAL | Status: DC | PRN
  Filled 2021-05-23: qty 16

## 2021-05-23 MED ORDER — LOPERAMIDE HCL 2 MG PO CAPS: 2.0000 mg | ORAL_CAPSULE | Freq: Four times a day (QID) | ORAL | Status: DC | PRN

## 2021-05-23 MED ORDER — DIPHENHYDRAMINE HCL 12.5 MG/5ML PO ELIX
25.0000 mg | ORAL_SOLUTION | Freq: Four times a day (QID) | ORAL | Status: DC | PRN
  Administered 2021-05-25 – 2021-05-26 (×2): 25 mg via ORAL
  Filled 2021-05-23 (×5): qty 10

## 2021-05-23 MED ORDER — SENNOSIDES-DOCUSATE SODIUM 8.6-50 MG PO TABS: 1.0000 | ORAL_TABLET | Freq: Every evening | ORAL | Status: DC | PRN

## 2021-05-23 MED ORDER — ONDANSETRON HCL 4 MG PO TABS
4.0000 mg | ORAL_TABLET | Freq: Four times a day (QID) | ORAL | Status: DC | PRN
  Administered 2021-05-24: 4 mg via ORAL
  Filled 2021-05-23: qty 1

## 2021-05-23 MED ORDER — PROCHLORPERAZINE EDISYLATE 10 MG/2ML IJ SOLN: 5.0000 mg | Freq: Once | INTRAMUSCULAR | Status: DC

## 2021-05-23 MED ORDER — LINAGLIPTIN 5 MG PO TABS
5.0000 mg | ORAL_TABLET | Freq: Every day | ORAL | Status: DC
  Administered 2021-05-23 – 2021-05-26 (×4): 5 mg via ORAL
  Filled 2021-05-23 (×4): qty 1

## 2021-05-23 MED ORDER — LORATADINE 10 MG PO TABS: 10.0000 mg | ORAL_TABLET | Freq: Every day | ORAL | Status: DC | PRN

## 2021-05-23 MED ORDER — ATORVASTATIN CALCIUM 20 MG PO TABS
40.0000 mg | ORAL_TABLET | Freq: Every day | ORAL | Status: DC
  Administered 2021-05-23 – 2021-05-26 (×4): 40 mg via ORAL
  Filled 2021-05-23 (×4): qty 2

## 2021-05-23 MED ORDER — ACETAMINOPHEN 325 MG PO TABS: 650.0000 mg | ORAL_TABLET | Freq: Four times a day (QID) | ORAL | Status: DC | PRN

## 2021-05-23 MED ORDER — MAGNESIUM HYDROXIDE 400 MG/5ML PO SUSP
30.0000 mL | Freq: Every day | ORAL | Status: DC | PRN
  Administered 2021-05-25: 15:00:00 30 mL via ORAL
  Filled 2021-05-23: qty 30

## 2021-05-23 MED ORDER — ACETAMINOPHEN 650 MG RE SUPP
650.0000 mg | Freq: Four times a day (QID) | RECTAL | Status: DC | PRN
  Filled 2021-05-23: qty 1

## 2021-05-23 MED ORDER — MIDODRINE HCL 5 MG PO TABS
5.0000 mg | ORAL_TABLET | Freq: Three times a day (TID) | ORAL | Status: DC
  Administered 2021-05-23 – 2021-05-26 (×7): 5 mg via ORAL
  Filled 2021-05-23 (×7): qty 1

## 2021-05-23 MED ORDER — ONDANSETRON HCL 4 MG/2ML IJ SOLN: 4.0000 mg | Freq: Four times a day (QID) | INTRAMUSCULAR | Status: DC | PRN

## 2021-05-23 MED ORDER — KETOTIFEN FUMARATE 0.025 % OP SOLN
1.0000 [drp] | Freq: Two times a day (BID) | OPHTHALMIC | Status: DC
  Administered 2021-05-24 – 2021-05-26 (×5): 1 [drp] via OPHTHALMIC
  Filled 2021-05-23: qty 5

## 2021-05-23 MED ORDER — CALCITRIOL 0.25 MCG PO CAPS
0.7500 ug | ORAL_CAPSULE | ORAL | Status: DC
  Administered 2021-05-23 – 2021-05-25 (×2): 0.75 ug via ORAL
  Filled 2021-05-23 (×2): qty 3

## 2021-05-23 NOTE — Significant Event (Addendum)
I was notified by patient's nurse at around 9:37 PM that patient is back from dialysis and appears confused.  At times talking about things which were not there.  At dialysis patient was briefly hypotensive.  I examined patient at bedside patient blood pressure is 130/70 pulse is around 70/min respiration 18/min afebrile. CBG was 230.  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  Pupils equal reacting to light able to count in both eyes.  No obvious facial asymmetry.  At times patient is talking about things which are not there.  Patient also complains of pain in the right side of the neck going to the chest.  I ordered EKG which shows normal sinus rhythm with nonspecific ST-T changes.  I discussed with on-call neurologist Dr. Erlinda Hong who advised getting CT of the head and C-spine.  And MRI of the brain.  If MRI of the brain does show stroke, then further stroke work-up.  Otherwise encephalopathy work-up.  We will also trend cardiac markers check chest x-ray.    Addendum -patient also started having persistent nausea.  On exam patient had tenderness in upper quadrants.  CT renal study shows cholelithiasis with some pericholecystic fluid.  Right upper quadrant sonogram was ordered.    Patient's family including patient's son at the bedside updated about the plan.  Gean Birchwood

## 2021-05-23 NOTE — ED Notes (Signed)
Consent for hemodialysis obtain see paper chart.

## 2021-05-23 NOTE — Consult Note (Signed)
[facility]  CONSULT NOTE    Date: 05/23/2021                  Patient Name:  Amy Wu  MRN: [id number]  DOB: Nov 19, 1950  Age / Sex: 70 y.o., female         PCP: System, Provider Not In                 Service Requesting Consult: [place]                 Reason for Consult: End-stage renal disease            History of Present Illness: Amy Wu is a 70 y.o.  female with past medical history of hypertension, diabetes, PVD, dyslipidemia, and end-stage renal disease on dialysis, who was admitted to [facility] on 05/23/2021 for ESRD on dialysis [facility]) [N18.6, Z99.2]   Patient is known to our practice and receives outpatient dialysis treatments at [facility], under the supervision of [facility] physicians.  She presents to the emergency department with bleeding from her left thigh dialysis fistula.  She states she just woke up and it was "squirting blood."  She states she did attend her last treatment on Friday, received a full treatment with no complications.  Currently denies fever and chills.  Denies shortness of breath, nausea, vomiting, and diarrhea.  Denies chest pain and dizziness.  Initial lab work in the ED within acceptable limits.  COVID and flu labs all negative.  Vascular surgery also consulted for fistula management.   Medications: Outpatient medications: (Not in a hospital admission)   Current medications: Current Facility-Administered Medications  Medication Dose Route Frequency Provider Last Rate Last Admin   0.9 %  sodium chloride infusion  250 mL Intravenous PRN Mansy, Jan A, MD       acetaminophen (TYLENOL) tablet 650 mg  650 mg Oral Q6H PRN Mansy, Jan A, MD       Or   acetaminophen (TYLENOL) suppository 650 mg  650 mg Rectal Q6H PRN Mansy, Jan A, MD       atorvastatin (LIPITOR) tablet 40 mg  40 mg Oral Daily Mansy, Jan A, MD   40 mg at 05/23/21 1962   benzonatate (TESSALON) capsule 100 mg  100 mg Oral TID PRN Mansy, Jan A, MD        calcitRIOL (ROCALTROL) capsule 0.75 mcg  0.75 mcg Oral Q M,W,F-HD Mansy, Jan A, MD   0.75 mcg at 05/23/21 1153   camphor-menthol [facility]) lotion 1 application  1 application Topical BID PRN Mansy, Jan A, MD       cholecalciferol (VITAMIN D) tablet 2,000 Units  2,000 Units Oral Daily Mansy, Jan A, MD   2,000 Units at 05/23/21 0905   cinacalcet (SENSIPAR) tablet 30 mg  30 mg Oral Q supper Mansy, Jan A, MD       diphenhydrAMINE (BENADRYL) 12.5 MG/5ML elixir 25 mg  25 mg Oral Q6H PRN Mansy, Jan A, MD       ferrous sulfate tablet 325 mg  325 mg Oral QODAY Mansy, Jan A, MD   325 mg at 05/23/21 0904   fluticasone (FLONASE) 50 MCG/ACT nasal spray 2 spray  2 spray Each Nare Daily PRN Mansy, Jan A, MD       gabapentin (NEURONTIN) capsule 100 mg  100 mg Oral Q M,W,F-1800 Mansy, Jan A, MD       glucose chewable tablet 4 g  1 tablet  Oral PRN Mansy, Arvella Merles, MD       hydrocerin (EUCERIN) cream 1 application  1 application Topical QID PRN Mansy, Jan A, MD       insulin aspart (novoLOG) injection 0-9 Units  0-9 Units Subcutaneous TID PC & HS Mansy, Arvella Merles, MD   2 Units at 05/23/21 0942   [START ON 05/24/2021] ketotifen (ZADITOR) 0.025 % ophthalmic solution 1 drop  1 drop Both Eyes BID Mansy, Jan A, MD       lanthanum [facility]) chewable tablet 1,000 mg  1,000 mg Oral BID WC Mansy, Jan A, MD   1,000 mg at 05/23/21 6629   linagliptin (TRADJENTA) tablet 5 mg  5 mg Oral Daily Mansy, Jan A, MD   5 mg at 05/23/21 4765   loperamide (IMODIUM) capsule 2 mg  2 mg Oral QID PRN Mansy, Jan A, MD       loratadine (CLARITIN) tablet 10 mg  10 mg Oral Daily PRN Mansy, Jan A, MD       magnesium hydroxide (MILK OF MAGNESIA) suspension 30 mL  30 mL Oral Daily PRN Mansy, Jan A, MD       ondansetron [facility]) tablet 4 mg  4 mg Oral Q6H PRN Mansy, Jan A, MD       Or   ondansetron [facility]) injection 4 mg  4 mg Intravenous Q6H PRN Mansy, Jan A, MD       polyethylene glycol (MIRALAX / GLYCOLAX) packet 17 g  17 g Oral Daily PRN Mansy, Jan A, MD        rOPINIRole (REQUIP) tablet 0.25 mg  0.25 mg Oral QHS PRN Mansy, Jan A, MD       senna-docusate (Senokot-S) tablet 1 tablet  1 tablet Oral QHS PRN Mansy, Arvella Merles, MD       simethicone [facility]) chewable tablet 80 mg  80 mg Oral QID PRN Mansy, Jan A, MD       sodium chloride flush (NS) 0.9 % injection 3 mL  3 mL Intravenous Q12H Mansy, Jan A, MD   3 mL at 05/23/21 4650   sodium chloride flush (NS) 0.9 % injection 3 mL  3 mL Intravenous PRN Mansy, Jan A, MD       traZODone (DESYREL) tablet 25 mg  25 mg Oral QHS PRN Mansy, Arvella Merles, MD       vitamin A & D ointment   Topical Daily PRN Mansy, Arvella Merles, MD       Current Outpatient Medications  Medication Sig Dispense Refill   acetaminophen (TYLENOL) 500 MG tablet Take 1,000 mg by mouth every 8 (eight) hours as needed for moderate pain.     aspirin EC 81 MG tablet Take 81 mg by mouth daily.     atorvastatin (LIPITOR) 40 MG tablet Take 40 mg by mouth daily.     benzonatate (TESSALON) 100 MG capsule Take by mouth 3 (three) times daily as needed for cough.     calcitRIOL (ROCALTROL) 0.25 MCG capsule Take 0.25 mcg by mouth daily. Take 3 capsules by mouth three times week at dialysis     camphor-menthol [facility]) lotion Apply 1 application topically as needed for itching. Twice a day if needed     cinacalcet (SENSIPAR) 30 MG tablet Take 30 mg by mouth daily with supper.      Dextromethorphan-guaiFENesin (MUCINEX DM) 30-600 MG TB12 Take 1 tablet by mouth daily.     diphenhydrAMINE (BENADRYL) 12.5 MG/5ML elixir Take 12.5 mg by mouth every  6 (six) hours as needed.     ferrous sulfate 325 (65 FE) MG tablet Take 325 mg by mouth every other day.     fluticasone (FLONASE) 50 MCG/ACT nasal spray Place 1 spray into both nostrils daily.     gabapentin (NEURONTIN) 100 MG capsule Take 100 mg by mouth 3 (three) times daily. M/W/F after dialysis     glucose 4 GM chewable tablet Chew 1 tablet by mouth as needed for low blood sugar. If blood sugar less than 70. Chew 3 tablets  and recheck blood sugar in 15 minutes     glucose blood test strip 1 each by Other route as needed for other. Use as instructed     JANUVIA 25 MG tablet Take 25 mg by mouth daily.     ketotifen (ZADITOR) 0.025 % ophthalmic solution Place 1 drop into both eyes 2 (two) times daily.     lanthanum (FOSRENOL) 1000 MG chewable tablet Chew 1,000 mg by mouth 2 (two) times daily with a meal.      LANTUS 100 UNIT/ML injection Inject 7 Units into the skin every evening.     loperamide (IMODIUM A-D) 2 MG tablet Take 2 mg by mouth 4 (four) times daily as needed for diarrhea or loose stools. 2 tablets by mouth for onset of loose stool, than 1 tablet by mouth after each loose stool MAX 4 tablets in 24 hours     loratadine (CLARITIN) 10 MG tablet Take 10 mg by mouth daily as needed for allergies.     NYSTATIN powder SMARTSIG:Sparingly Topical Daily PRN     polyethylene glycol (MIRALAX / GLYCOLAX) packet Take 17 g by mouth daily as needed for moderate constipation or severe constipation.     RA VITAMINS A & D EX Apply topically as needed.     rOPINIRole (REQUIP) 0.25 MG tablet Take 0.25 mg by mouth at bedtime as needed.     senna-docusate (SENOKOT-S) 8.6-50 MG tablet Take 1-2 tablets by mouth at bedtime as needed for mild constipation or moderate constipation. 1-2 tablets at bedtime if needed     Simethicone 180 MG CAPS Take 1 capsule by mouth daily.     Skin Protectants, Misc. (EUCERIN) cream Apply 1 application topically 4 (four) times daily as needed for dry skin.      Vitamins A & D (VITAMIN A & D) ointment Apply topically daily as needed.     Zinc Oxide 13 % CREA Apply 1 application topically as needed for irritation.     Methoxy PEG-Epoetin Beta (MIRCERA IJ) Mircera (Patient not taking: Reported on 05/23/2021)     Methoxy PEG-Epoetin Beta (MIRCERA IJ) Mircera (Patient not taking: Reported on 05/23/2021)        Allergies: Allergies  Allergen Reactions   Ancef [Cefazolin] Palpitations   Contrast Media  [Iodinated Diagnostic Agents] Palpitations      Past Medical History: Past Medical History:  Diagnosis Date   Arthritis    Chronic kidney disease    Diabetes mellitus without complication ([place])    Hyperlipidemia    Hypertension    Peripheral vascular disease ([place])      Past Surgical History: Past Surgical History:  Procedure Laterality Date   A/V SHUNT INTERVENTION Left 11/20/2019   Procedure: A/V SHUNT INTERVENTION (Left Thigh);  Surgeon: Algernon Huxley, MD;  Location: [place] [facility];  Service: Cardiovascular;  Laterality: Left;   A/V SHUNTOGRAM Left 02/16/2017   Procedure: A/V Shuntogram;  Surgeon: Algernon Huxley, MD;  Location:  [place] [facility];  Service: Cardiovascular;  Laterality: Left;   A/V SHUNTOGRAM Left 12/13/2017   Procedure: A/V SHUNTOGRAM;  Surgeon: Algernon Huxley, MD;  Location: [place] [facility];  Service: Cardiovascular;  Laterality: Left;   A/V SHUNTOGRAM Left 12/05/2018   Procedure: A/V SHUNTOGRAM;  Surgeon: Algernon Huxley, MD;  Location: [place] [facility];  Service: Cardiovascular;  Laterality: Left;   A/V SHUNTOGRAM Left 06/03/2020   Procedure: A/V SHUNTOGRAM;  Surgeon: Algernon Huxley, MD;  Location: [place] [facility];  Service: Cardiovascular;  Laterality: Left;   ABOVE KNEE LEG AMPUTATION     AV FISTULA PLACEMENT Bilateral    AV FISTULA PLACEMENT Left 07/01/2015   Procedure: INSERTION OF ARTERIOVENOUS (AV) GRAFT THIGH (ARTEGRAFT);  Surgeon: Algernon Huxley, MD;  Location: [facility];  Service: Vascular;  Laterality: Left;   BELOW KNEE LEG AMPUTATION Left    BREAST BIOPSY     ENDARTERECTOMY FEMORAL Left 07/01/2015   Procedure: SUPERFICIAL FEMORAL ENDARTERECTOMY ;  Surgeon: Algernon Huxley, MD;  Location: [facility];  Service: Vascular;  Laterality: Left;   EXCHANGE OF A DIALYSIS CATHETER  02/11/2015   Procedure: Exchange Of A Dialysis Catheter;  Surgeon: Algernon Huxley, MD;  Location: [place] [facility];  Service: Cardiovascular;;   LEG AMPUTATION ABOVE KNEE Right     PERIPHERAL VASCULAR CATHETERIZATION N/A 02/11/2015   Procedure: Dialysis/Perma Catheter Insertion;  Surgeon: Algernon Huxley, MD;  Location: [place] [facility];  Service: Cardiovascular;  Laterality: N/A;   PERIPHERAL VASCULAR CATHETERIZATION N/A 05/11/2015   Procedure: Dialysis/Perma Catheter Insertion, exchange;  Surgeon: Katha Cabal, MD;  Location: [place] [facility];  Service: Cardiovascular;  Laterality: N/A;   PERIPHERAL VASCULAR CATHETERIZATION N/A 05/18/2015   Procedure: Dialysis/Perma Catheter Insertion;  Surgeon: Katha Cabal, MD;  Location: [place] [facility];  Service: Cardiovascular;  Laterality: N/A;   PERIPHERAL VASCULAR CATHETERIZATION Left 07/19/2015   Procedure: A/V Shuntogram/Fistulagram;  Surgeon: Algernon Huxley, MD;  Location: [place] [facility];  Service: Cardiovascular;  Laterality: Left;   PERIPHERAL VASCULAR CATHETERIZATION N/A 07/19/2015   Procedure: A/V Shunt Intervention;  Surgeon: Algernon Huxley, MD;  Location: [place] [facility];  Service: Cardiovascular;  Laterality: N/A;   PERIPHERAL VASCULAR CATHETERIZATION N/A 08/26/2015   Procedure: Dialysis/Perma Catheter Removal;  Surgeon: Algernon Huxley, MD;  Location: [place] [facility];  Service: Cardiovascular;  Laterality: N/A;   PERIPHERAL VASCULAR THROMBECTOMY N/A 12/29/2016   Procedure: Peripheral Vascular Thrombectomy;  Surgeon: Katha Cabal, MD;  Location: [place] [facility];  Service: Cardiovascular;  Laterality: N/A;     Family History: Family History  Problem Relation Age of Onset   Breast cancer Neg Hx      Social History: Social History   Socioeconomic History   Marital status: Single    Spouse name: Not on file   Number of children: Not on file   Years of education: Not on file   Highest education level: Not on file  Occupational History   Not on file  Tobacco Use   Smoking status: Never   Smokeless tobacco: Never  Vaping Use   Vaping Use: Never used  Substance and Sexual  Activity   Alcohol use: No   Drug use: No   Sexual activity: Not on file  Other Topics Concern   Not on file  Social History Narrative   Not on file   Social Determinants of Health   Financial Resource Strain: Not on  file  Food Insecurity: Not on file  Transportation Needs: Not on file  Physical Activity: Not on file  Stress: Not on file  Social Connections: Not on file  Intimate Partner Violence: Not on file     Review of Systems: Review of Systems  Constitutional:  Negative for chills, fever and malaise/fatigue.  HENT:  Negative for congestion, sore throat and tinnitus.   Eyes:  Negative for blurred vision and redness.  Respiratory:  Negative for cough, shortness of breath and wheezing.   Cardiovascular:  Negative for chest pain, palpitations, claudication and leg swelling.  Gastrointestinal:  Negative for abdominal pain, blood in stool, diarrhea, nausea and vomiting.  Genitourinary:  Negative for flank pain, frequency and hematuria.  Musculoskeletal:  Negative for back pain, falls and myalgias.  Skin:  Negative for rash.  Neurological:  Negative for dizziness, weakness and headaches.  Endo/Heme/Allergies:  Does not bruise/bleed easily.  Psychiatric/Behavioral:  Negative for depression. The patient is not nervous/anxious and does not have insomnia.    Vital Signs: Blood pressure (!) 108/50, pulse 78, temperature 99 F (37.2 C), temperature source Oral, resp. rate 17, weight 92.1 kg, SpO2 99 %.  Weight trends: Filed Weights   05/23/21 0342  Weight: 92.1 kg    Physical Exam: General: NAD, resting on stretcher  Head: Normocephalic, atraumatic. Moist oral mucosal membranes  Eyes: Anicteric  Lungs:  Clear to auscultation, normal effort  Heart: Regular rate and rhythm  Abdomen:  Soft, nontender, obese  Extremities: Trace peripheral edema.  Neurologic: Nonfocal, moving all four extremities  Skin: No lesions  Access: Left thigh aVF     Lab results: Basic  Metabolic Panel: Recent Labs  Lab 05/23/21 0343  NA 138  K 4.2  CL 97*  CO2 30  GLUCOSE 210*  BUN 43*  CREATININE 9.64*  CALCIUM 9.0    Liver Function Tests: No results for input(s): AST, ALT, ALKPHOS, BILITOT, PROT, ALBUMIN in the last 168 hours. No results for input(s): LIPASE, AMYLASE in the last 168 hours. No results for input(s): AMMONIA in the last 168 hours.  CBC: Recent Labs  Lab 05/23/21 0343  WBC 8.7  NEUTROABS 5.1  HGB 11.9*  HCT 36.6  MCV 81.2  PLT 226    Cardiac Enzymes: No results for input(s): CKTOTAL, CKMB, CKMBINDEX, TROPONINI in the last 168 hours.  BNP: Invalid input(s): POCBNP  CBG: Recent Labs  Lab 05/23/21 0912 05/23/21 1153 05/23/21 1425  GLUCAP 164* 115* 154*    Microbiology: Results for orders placed or performed during the hospital encounter of 05/23/21  Resp Panel by RT-PCR (Flu A&B, Covid) Nasopharyngeal Swab     Status: None   Collection Time: 05/23/21  5:01 AM   Specimen: Nasopharyngeal Swab; Nasopharyngeal(NP) swabs in vial transport medium  Result Value Ref Range Status   SARS Coronavirus 2 by RT PCR NEGATIVE NEGATIVE Final    Comment: (NOTE) SARS-CoV-2 target nucleic acids are NOT DETECTED.  The SARS-CoV-2 RNA is generally detectable in upper respiratory specimens during the acute phase of infection. The lowest concentration of SARS-CoV-2 viral copies this assay can detect is 138 copies/mL. A negative result does not preclude SARS-Cov-2 infection and should not be used as the sole basis for treatment or other patient management decisions. A negative result may occur with  improper specimen collection/handling, submission of specimen other than nasopharyngeal swab, presence of viral mutation(s) within the areas targeted by this assay, and inadequate number of viral copies(<138 copies/mL). A negative result must be combined with  clinical observations, patient history, and epidemiological information. The expected result  is Negative.  Fact Sheet for Patients:  EntrepreneurPulse.com.au  Fact Sheet for Healthcare Providers:  IncredibleEmployment.be  This test is no t yet approved or cleared by the [place] FDA and  has been authorized for detection and/or diagnosis of SARS-CoV-2 by FDA under an Emergency Use Authorization (EUA). This EUA will remain  in effect (meaning this test can be used) for the duration of the COVID-19 declaration under Section 564(b)(1) of the Act, 21 U.S.C.section 360bbb-3(b)(1), unless the authorization is terminated  or revoked sooner.       Influenza A by PCR NEGATIVE NEGATIVE Final   Influenza B by PCR NEGATIVE NEGATIVE Final    Comment: (NOTE) The Xpert Xpress SARS-CoV-2/FLU/RSV plus assay is intended as an aid in the diagnosis of influenza from Nasopharyngeal swab specimens and should not be used as a sole basis for treatment. Nasal washings and aspirates are unacceptable for Xpert Xpress SARS-CoV-2/FLU/RSV testing.  Fact Sheet for Patients: EntrepreneurPulse.com.au  Fact Sheet for Healthcare Providers: IncredibleEmployment.be  This test is not yet approved or cleared by the [place] FDA and has been authorized for detection and/or diagnosis of SARS-CoV-2 by FDA under an Emergency Use Authorization (EUA). This EUA will remain in effect (meaning this test can be used) for the duration of the COVID-19 declaration under Section 564(b)(1) of the Act, 21 U.S.C. section 360bbb-3(b)(1), unless the authorization is terminated or revoked.  Performed at [facility], [address] [place], [place] [address]     Coagulation Studies: Recent Labs    05/23/21 0343  LABPROT 13.7  INR 1.1    Urinalysis: No results for input(s): COLORURINE, LABSPEC, PHURINE, GLUCOSEU, HGBUR, BILIRUBINUR, KETONESUR, PROTEINUR, UROBILINOGEN, NITRITE, LEUKOCYTESUR in the last 72 hours.  Invalid  input(s): APPERANCEUR    Imaging: No results found.   Assessment & Plan: Amy Wu is a 70 y.o.  female with ith past medical history of hypertension, diabetes, PVD, dyslipidemia, and end-stage renal disease on dialysis, who was admitted to [facility] on 05/23/2021 for ESRD on dialysis [facility]) [N18.6, Z99.2]  [facility] [place]/MWF/ Lt thigh AVF  End-stage renal disease with bleeding access on hemodialysis.  Will maintain outpatient schedule, if possible.  Plan to dialyze patient today with low UF.  Next treatment scheduled for Wednesday.  Fistulogram planned by vascular surgery tomorrow.  2. Anemia of chronic kidney disease Lab Results  Component Value Date   HGB 11.9 (L) 05/23/2021  Hemoglobin remained stable at this time.  We will continue to monitor in setting of recent bleeding access.  No need for ESA's at this time  3. Secondary Hyperparathyroidism: Lab Results  Component Value Date   CALCIUM 9.0 05/23/2021  Calcium within acceptable range.  Will obtain updated phosphorus with a.m. labs.  4. Diabetes mellitus type II with chronic kidney disease insulin dependent. Home regimen includes Lantus and Januvia. Glucose stable at this time  LOS: 0 [place] Doss 12/5/20222:54 PM

## 2021-05-23 NOTE — ED Notes (Signed)
BP trending down now and pt is borderline maintaining a MAP of 65, MD aware, see MAR

## 2021-05-23 NOTE — H&P (Signed)
[place]   PATIENT NAME: Amy Wu    MR#:  [id number]  DATE OF BIRTH:  03-05-1951  DATE OF ADMISSION:  05/23/2021  PRIMARY CARE PHYSICIAN: System, Provider Not In   Patient is coming from: Home  REQUESTING/REFERRING PHYSICIAN: Hinda Kehr, MD  CHIEF COMPLAINT:   Chief Complaint  Patient presents with   Vascular Access Problem    HISTORY OF PRESENT ILLNESS:  Amy Wu is a 70 y.o. African-American female with medical history significant for end-stage renal disease on hemodialysis Monday Wednesday Friday, hypertension, dyslipidemia, type diabetes mellitus and peripheral vascular disease, who presented to the ER for acute onset of bleeding from her left thigh dialysis AV fistula.  When she woke up she noted that it was spurting blood.  Blood was everywhere including the walls and the ceiling.  There was significant amount of blood at the scene per EMS and it was pulsatile from the leg.  They applied pressure and brought her to the ER.  By that time she was no longer bleeding.  She denies any presyncope or syncope, chest pain or palpitations or headache or dizziness but admitted to mild blurred vision then.  She is due for hemodialysis today.  ED Course: Upon presentation to the ER, blood pressure was 92/54 and later 112/61 with otherwise normal vital signs.  Labs revealed a BUN of 43 and creatinine 1.64 and hemoglobin of 11.9 compared to 10.6 on 11/19/2019 with platelets of 226.  Blood glucose was 210.  Influenza antigens came back negative as well as COVID-19 PCR. EKG as reviewed by me : EKG showed sinus rhythm with a rate of 84 with short PR interval with left anterior fascicular block and LVH with Q waves anteroseptally.  The patient had applied dressing on her left thigh with adequate hemostasis.  Dr. Theador Hawthorne was  contacted about the patient and she will be admitted to a medical telemetry bed for further management. PAST MEDICAL HISTORY:   Past Medical History:   Diagnosis Date   Arthritis    Chronic kidney disease    Diabetes mellitus without complication ([place])    Hyperlipidemia    Hypertension    Peripheral vascular disease ([place])     PAST SURGICAL HISTORY:   Past Surgical History:  Procedure Laterality Date   A/V SHUNT INTERVENTION Left 11/20/2019   Procedure: A/V SHUNT INTERVENTION (Left Thigh);  Surgeon: Algernon Huxley, MD;  Location: [facility] [facility];  Service: Cardiovascular;  Laterality: Left;   A/V SHUNTOGRAM Left 02/16/2017   Procedure: A/V Shuntogram;  Surgeon: Algernon Huxley, MD;  Location: [place] [facility];  Service: Cardiovascular;  Laterality: Left;   A/V SHUNTOGRAM Left 12/13/2017   Procedure: A/V SHUNTOGRAM;  Surgeon: Algernon Huxley, MD;  Location: [place] [facility];  Service: Cardiovascular;  Laterality: Left;   A/V SHUNTOGRAM Left 12/05/2018   Procedure: A/V SHUNTOGRAM;  Surgeon: Algernon Huxley, MD;  Location: [place] [facility];  Service: Cardiovascular;  Laterality: Left;   A/V SHUNTOGRAM Left 06/03/2020   Procedure: A/V SHUNTOGRAM;  Surgeon: Algernon Huxley, MD;  Location: [place] [facility];  Service: Cardiovascular;  Laterality: Left;   ABOVE KNEE LEG AMPUTATION     AV FISTULA PLACEMENT Bilateral    AV FISTULA PLACEMENT Left 07/01/2015   Procedure: INSERTION OF ARTERIOVENOUS (AV) GRAFT THIGH (ARTEGRAFT);  Surgeon: Algernon Huxley, MD;  Location: [facility];  Service: Vascular;  Laterality: Left;   BELOW KNEE LEG AMPUTATION Left  BREAST BIOPSY     ENDARTERECTOMY FEMORAL Left 07/01/2015   Procedure: SUPERFICIAL FEMORAL ENDARTERECTOMY ;  Surgeon: Algernon Huxley, MD;  Location: [facility];  Service: Vascular;  Laterality: Left;   EXCHANGE OF A DIALYSIS CATHETER  02/11/2015   Procedure: Exchange Of A Dialysis Catheter;  Surgeon: Algernon Huxley, MD;  Location: [place] [facility];  Service: Cardiovascular;;   LEG AMPUTATION ABOVE KNEE Right    PERIPHERAL VASCULAR CATHETERIZATION N/A 02/11/2015   Procedure: Dialysis/Perma Catheter  Insertion;  Surgeon: Algernon Huxley, MD;  Location: [place] [facility];  Service: Cardiovascular;  Laterality: N/A;   PERIPHERAL VASCULAR CATHETERIZATION N/A 05/11/2015   Procedure: Dialysis/Perma Catheter Insertion, exchange;  Surgeon: Katha Cabal, MD;  Location: [place] [facility];  Service: Cardiovascular;  Laterality: N/A;   PERIPHERAL VASCULAR CATHETERIZATION N/A 05/18/2015   Procedure: Dialysis/Perma Catheter Insertion;  Surgeon: Katha Cabal, MD;  Location: [place] [facility];  Service: Cardiovascular;  Laterality: N/A;   PERIPHERAL VASCULAR CATHETERIZATION Left 07/19/2015   Procedure: A/V Shuntogram/Fistulagram;  Surgeon: Algernon Huxley, MD;  Location: [place] [facility];  Service: Cardiovascular;  Laterality: Left;   PERIPHERAL VASCULAR CATHETERIZATION N/A 07/19/2015   Procedure: A/V Shunt Intervention;  Surgeon: Algernon Huxley, MD;  Location: [place] [facility];  Service: Cardiovascular;  Laterality: N/A;   PERIPHERAL VASCULAR CATHETERIZATION N/A 08/26/2015   Procedure: Dialysis/Perma Catheter Removal;  Surgeon: Algernon Huxley, MD;  Location: [place] [facility];  Service: Cardiovascular;  Laterality: N/A;   PERIPHERAL VASCULAR THROMBECTOMY N/A 12/29/2016   Procedure: Peripheral Vascular Thrombectomy;  Surgeon: Katha Cabal, MD;  Location: [place] [facility];  Service: Cardiovascular;  Laterality: N/A;    SOCIAL HISTORY:   Social History   Tobacco Use   Smoking status: Never   Smokeless tobacco: Never  Substance Use Topics   Alcohol use: No    FAMILY HISTORY:   Family History  Problem Relation Age of Onset   Breast cancer Neg Hx     DRUG ALLERGIES:   Allergies  Allergen Reactions   Ancef [Cefazolin] Palpitations   Contrast Media [Iodinated Diagnostic Agents] Palpitations    REVIEW OF SYSTEMS:   ROS As per history of present illness. All pertinent systems were reviewed above. Constitutional, HEENT, cardiovascular, respiratory, GI, GU, musculoskeletal,  neuro, psychiatric, endocrine, integumentary and hematologic systems were reviewed and are otherwise negative/unremarkable except for positive findings mentioned above in the HPI.   MEDICATIONS AT HOME:   Prior to Admission medications   Medication Sig Start Date End Date Taking? Authorizing Provider  acetaminophen (TYLENOL) 500 MG tablet Take 1,000 mg by mouth every 8 (eight) hours as needed for moderate pain.    [provider]  aspirin 81 MG EC tablet Take by mouth.    [provider]  aspirin 81 MG tablet Take 81 mg by mouth daily.    [provider]  aspirin EC 81 MG tablet Take 81 mg by mouth daily. 11/11/20   [provider]  atorvastatin (LIPITOR) 40 MG tablet Take 40 mg by mouth daily.    [provider]  atorvastatin (LIPITOR) 40 MG tablet Take by mouth. 10/28/10   [provider]  benzonatate (TESSALON) 100 MG capsule Take by mouth 3 (three) times daily as needed for cough.    [provider]  calcitRIOL (ROCALTROL) 0.25 MCG capsule Take 0.25 mcg by mouth daily. Take 3 capsules by mouth three times week at dialysis    [provider]  camphor-menthol (SARNA) lotion Apply 1 application topically as needed for itching. Twice a day if needed    [provider]  Cholecalciferol (VITAMIN D3) 2000 units TABS Take 2,000 Units by mouth daily.    [provider]  cinacalcet (SENSIPAR) 30 MG tablet Take 30 mg by mouth daily with supper.     [provider]  Dextromethorphan-guaiFENesin [facility] DM) 30-600 MG TB12 Take by mouth. 04/30/17   [provider]  diphenhydrAMINE (BENADRYL) 12.5 MG/5ML elixir Take by mouth. 06/14/20 06/13/21  [provider]  ferrous sulfate 325 (65 FE) MG tablet Take 325 mg by mouth every other day.    [provider]  fluticasone (FLONASE) 50 MCG/ACT nasal spray Place into both nostrils daily.    [provider]  gabapentin (NEURONTIN)  100 MG capsule Take 100 mg by mouth 3 (three) times daily. M/W/F after dialysis    [provider]  glucose 4 GM chewable tablet Chew 1 tablet by mouth as needed for low blood sugar. If blood sugar less than 70. Chew 3 tablets and recheck blood sugar in 15 minutes    [provider]  glucose blood test strip 1 each by Other route as needed for other. Use as instructed    [provider]  insulin aspart (NOVOLOG) 100 UNIT/ML injection Inject 1 Units into the skin See admin instructions. Pt uses insulin PRN for Blood Sugar greater then 200 uses 1 unit if greater then 200    [provider]  JANUVIA 25 MG tablet Take 25 mg by mouth daily. 11/02/20   [provider]  ketotifen (ZADITOR) 0.025 % ophthalmic solution Place 1 drop into both eyes 2 (two) times daily.    [provider]  lanthanum (FOSRENOL) 1000 MG chewable tablet Chew 1,000 mg by mouth 2 (two) times daily with a meal.     [provider]  loperamide (IMODIUM A-D) 2 MG tablet Take 2 mg by mouth 4 (four) times daily as needed for diarrhea or loose stools. 2 tablets by mouth for onset of loose stool, than 1 tablet by mouth after each loose stool MAX 4 tablets in 24 hours    [provider]  loratadine (CLARITIN) 10 MG tablet Take 10 mg by mouth daily as needed for allergies.    [provider]  Methoxy PEG-Epoetin Beta (MIRCERA IJ) Mircera 07/21/20 07/20/21  [provider]  Methoxy PEG-Epoetin Beta (MIRCERA IJ) Mircera 07/07/20 07/06/21  [provider]  NYSTATIN powder SMARTSIG:Sparingly Topical Daily PRN 11/18/20   [provider]  polyethylene glycol (MIRALAX / GLYCOLAX) packet Take 17 g by mouth daily as needed for moderate constipation or severe constipation.    [provider]  RA VITAMINS A & D EX Apply topically as needed.    [provider]  rOPINIRole (REQUIP) 0.25 MG tablet Take 0.25 mg by mouth at bedtime as needed.     [provider]  senna-docusate (SENOKOT-S) 8.6-50 MG tablet Take 1-2 tablets by mouth at bedtime as needed for mild constipation or moderate constipation. 1-2 tablets at bedtime if needed    [provider]  Simethicone 180 MG CAPS Take by mouth.    [provider]  Skin Protectants, Misc. (EUCERIN) cream Apply 1 application topically 4 (four) times daily as needed for dry skin.     [provider]  Vitamins A & D (VITAMIN A & D) ointment Apply topically daily as needed. 08/31/20   [provider]  Zinc Oxide 13 % CREA Apply 1 application topically as needed for irritation.    [provider]      VITAL SIGNS:  Blood pressure (!) 98/53, pulse 74, temperature 99 F (37.2 C), temperature source Oral, resp. rate 17, weight 92.1 kg, SpO2 99 %.  PHYSICAL EXAMINATION:  Physical Exam  GENERAL:  70 y.o.-year-old African-American female patient lying in the bed with no acute distress.  EYES: Pupils equal, round, reactive to light and accommodation. No scleral icterus. Extraocular muscles intact.  HEENT: Head atraumatic, normocephalic. Oropharynx and nasopharynx clear.  NECK:  Supple, no jugular venous distention. No thyroid enlargement, no tenderness.  LUNGS: Normal breath sounds bilaterally, no wheezing, rales,rhonchi or crepitation. No use of accessory muscles of respiration.  CARDIOVASCULAR: Regular rate and rhythm, S1, S2 normal. No murmurs, rubs, or gallops.  ABDOMEN: Soft, nondistended, nontender. Bowel sounds present. No organomegaly or mass.  EXTREMITIES: No pedal edema, cyanosis, or clubbing.  Left thigh dressing over her AV graft.  Bloodsoaked dressing and garments beside her. NEUROLOGIC: Cranial nerves II through XII are intact. Muscle strength 5/5 in all extremities. Sensation intact. Gait not checked.  PSYCHIATRIC: The patient is alert and oriented x 3.  Normal affect and good eye contact. SKIN: No obvious rash, lesion, or ulcer.    LABORATORY PANEL:   CBC Recent Labs  Lab 05/23/21 0343  WBC 8.7  HGB 11.9*  HCT 36.6  PLT 226   ------------------------------------------------------------------------------------------------------------------  Chemistries  Recent Labs  Lab 05/23/21 0343  NA 138  K 4.2  CL 97*  CO2 30  GLUCOSE 210*  BUN 43*  CREATININE 9.64*  CALCIUM 9.0   ------------------------------------------------------------------------------------------------------------------  Cardiac Enzymes No results for input(s): TROPONINI in the last 168 hours. ------------------------------------------------------------------------------------------------------------------  RADIOLOGY:  No results found.    IMPRESSION AND PLAN:  Principal Problem:   ESRD on dialysis ([place])  1.  Left thigh/femoral AV graft bleeding.  She has mild subsequent acute blood loss anemia and mild hypotension.  Hemoglobin has minimally dropped since she came to the ER. - The patient will be admitted to a medical telemetry bed - Troponin follow serial hemoglobins and hematocrits. - Vascular surgery consult to be obtained. - I notified Dr. Delana Meyer about the patient.  2.  End-stage renal disease on hemodialysis. - Nephrology consult will be obtained. - Dr. Theador Hawthorne was contacted about the patient.  3.  Dyslipidemia. - We will continue statin therapy.  4.  Type 2 diabetes mellitus with peripheral neuropathy. - The patient will be placed on supplement coverage with NovoLog and will continue his basal coverage. - We will continue Januvia. - We will resume his Neurontin.  5.  Restless leg syndrome. - We will continue Requip.   DVT prophylaxis: SCDs.  Code Status: full code. Family Communication:  The plan of care was discussed in details with the patient (and family). I answered all questions. The patient agreed to proceed with the above mentioned plan. Further management will depend upon hospital  course. Disposition Plan: Back to previous home environment Consults called: Vascular surgery and nephrology. All the records are reviewed and case discussed with ED provider.  Status is: Inpatient    Remains inpatient appropriate because:Ongoing diagnostic testing needed not appropriate for outpatient work up, Unsafe d/c plan, IV treatments appropriate due to intensity of illness or inability to take PO, and Inpatient level of care appropriate due to severity of illness   Dispo: The patient is from: Home  Anticipated d/c is to: Home              Patient currently is not medically stable to d/c.              Difficult to place patient: No  TOTAL TIME TAKING CARE OF THIS PATIENT: 55 minutes.     Christel Mormon M.D on 05/23/2021 at 5:11 AM  [facility]   From 7 PM-7 AM, contact night-coverage [email or web address]  CC: Primary care physician; System, Provider Not In

## 2021-05-23 NOTE — ED Notes (Signed)
Report to Lorriane Shire RN at dialysis

## 2021-05-23 NOTE — Progress Notes (Addendum)
Patient returned from dialysis to room 34.

## 2021-05-23 NOTE — ED Notes (Signed)
CBG: 115 

## 2021-05-23 NOTE — ED Notes (Signed)
Pt to dialysis via transport

## 2021-05-23 NOTE — H&P (View-Only) (Signed)
[facility] Vascular Consult Note  MRN : [id number]  Amy Wu is a 70 y.o. (1950/11/01) female who presents with chief complaint of  Chief Complaint  Patient presents with   Vascular Access Problem   History of Present Illness:  Amy Wu is a 70 year old African-American female with medical history significant for end-stage renal disease on hemodialysis Monday Wednesday Friday, hypertension, dyslipidemia, type diabetes mellitus and peripheral vascular disease, who presented to the ER for acute onset of bleeding from her left thigh dialysis AV fistula.    When she woke up she noted that it was spurting blood.  Blood was everywhere including the walls and the ceiling.  There was significant amount of blood at the scene per EMS and it was pulsatile from the leg.  They applied pressure and brought her to the ER.  By that time she was no longer bleeding.  She denies any presyncope or syncope, chest pain or palpitations or headache or dizziness but admitted to mild blurred vision then.  She is due for hemodialysis today.  ED Course: Upon presentation to the ER, blood pressure was 92/54 and later 112/61 with otherwise normal vital signs.  Labs revealed a BUN of 43 and creatinine 1.64 and hemoglobin of 11.9 compared to 10.6 on 11/19/2019 with platelets of 226.  Blood glucose was 210.  Influenza antigens came back negative as well as COVID-19 PCR.  Vascular surgery was consulted by Dr. Posey Pronto for possible vascular intervention in the setting of end-stage renal disease with an episode of bleeding from her access.   Current Facility-Administered Medications  Medication Dose Route Frequency Provider Last Rate Last Admin   0.9 %  sodium chloride infusion  250 mL Intravenous PRN Mansy, Jan A, MD       acetaminophen (TYLENOL) tablet 650 mg  650 mg Oral Q6H PRN Mansy, Jan A, MD       Or   acetaminophen (TYLENOL) suppository 650 mg  650 mg Rectal Q6H PRN Mansy, Jan A, MD        atorvastatin (LIPITOR) tablet 40 mg  40 mg Oral Daily Mansy, Jan A, MD   40 mg at 05/23/21 0102   benzonatate (TESSALON) capsule 100 mg  100 mg Oral TID PRN Mansy, Jan A, MD       calcitRIOL (ROCALTROL) capsule 0.75 mcg  0.75 mcg Oral Q M,W,F-HD Mansy, Jan A, MD   0.75 mcg at 05/23/21 1153   camphor-menthol [facility]) lotion 1 application  1 application Topical BID PRN Mansy, Jan A, MD       cholecalciferol (VITAMIN D) tablet 2,000 Units  2,000 Units Oral Daily Mansy, Jan A, MD   2,000 Units at 05/23/21 7253   cinacalcet (SENSIPAR) tablet 30 mg  30 mg Oral Q supper Mansy, Jan A, MD       diphenhydrAMINE (BENADRYL) 12.5 MG/5ML elixir 25 mg  25 mg Oral Q6H PRN Mansy, Jan A, MD       ferrous sulfate tablet 325 mg  325 mg Oral QODAY Mansy, Jan A, MD   325 mg at 05/23/21 0904   fluticasone (FLONASE) 50 MCG/ACT nasal spray 2 spray  2 spray Each Nare Daily PRN Mansy, Jan A, MD       gabapentin (NEURONTIN) capsule 100 mg  100 mg Oral Q M,W,F-1800 Mansy, Jan A, MD       glucose chewable tablet 4 g  1 tablet Oral PRN Mansy, Arvella Merles, MD  hydrocerin (EUCERIN) cream 1 application  1 application Topical QID PRN Mansy, Jan A, MD       insulin aspart (novoLOG) injection 0-9 Units  0-9 Units Subcutaneous TID PC & HS Mansy, Arvella Merles, MD   2 Units at 05/23/21 0942   [START ON 05/24/2021] ketotifen (ZADITOR) 0.025 % ophthalmic solution 1 drop  1 drop Both Eyes BID Mansy, Jan A, MD       lanthanum [facility]) chewable tablet 1,000 mg  1,000 mg Oral BID WC Mansy, Jan A, MD   1,000 mg at 05/23/21 9417   linagliptin (TRADJENTA) tablet 5 mg  5 mg Oral Daily Mansy, Jan A, MD   5 mg at 05/23/21 4081   loperamide (IMODIUM) capsule 2 mg  2 mg Oral QID PRN Mansy, Jan A, MD       loratadine (CLARITIN) tablet 10 mg  10 mg Oral Daily PRN Mansy, Jan A, MD       magnesium hydroxide (MILK OF MAGNESIA) suspension 30 mL  30 mL Oral Daily PRN Mansy, Jan A, MD       ondansetron [facility]) tablet 4 mg  4 mg Oral Q6H PRN Mansy, Jan A, MD        Or   ondansetron [facility]) injection 4 mg  4 mg Intravenous Q6H PRN Mansy, Jan A, MD       polyethylene glycol (MIRALAX / GLYCOLAX) packet 17 g  17 g Oral Daily PRN Mansy, Jan A, MD       rOPINIRole (REQUIP) tablet 0.25 mg  0.25 mg Oral QHS PRN Mansy, Jan A, MD       senna-docusate (Senokot-S) tablet 1 tablet  1 tablet Oral QHS PRN Mansy, Arvella Merles, MD       simethicone [facility]) chewable tablet 80 mg  80 mg Oral QID PRN Mansy, Jan A, MD       sodium chloride flush (NS) 0.9 % injection 3 mL  3 mL Intravenous Q12H Mansy, Jan A, MD   3 mL at 05/23/21 4481   sodium chloride flush (NS) 0.9 % injection 3 mL  3 mL Intravenous PRN Mansy, Jan A, MD       traZODone (DESYREL) tablet 25 mg  25 mg Oral QHS PRN Mansy, Arvella Merles, MD       vitamin A & D ointment   Topical Daily PRN Mansy, Arvella Merles, MD       Current Outpatient Medications  Medication Sig Dispense Refill   acetaminophen (TYLENOL) 500 MG tablet Take 1,000 mg by mouth every 8 (eight) hours as needed for moderate pain.     aspirin EC 81 MG tablet Take 81 mg by mouth daily.     atorvastatin (LIPITOR) 40 MG tablet Take 40 mg by mouth daily.     benzonatate (TESSALON) 100 MG capsule Take by mouth 3 (three) times daily as needed for cough.     calcitRIOL (ROCALTROL) 0.25 MCG capsule Take 0.25 mcg by mouth daily. Take 3 capsules by mouth three times week at dialysis     camphor-menthol [facility]) lotion Apply 1 application topically as needed for itching. Twice a day if needed     cinacalcet (SENSIPAR) 30 MG tablet Take 30 mg by mouth daily with supper.      Dextromethorphan-guaiFENesin (MUCINEX DM) 30-600 MG TB12 Take 1 tablet by mouth daily.     diphenhydrAMINE (BENADRYL) 12.5 MG/5ML elixir Take 12.5 mg by mouth every 6 (six) hours as needed.     ferrous sulfate 325 (  65 FE) MG tablet Take 325 mg by mouth every other day.     fluticasone (FLONASE) 50 MCG/ACT nasal spray Place 1 spray into both nostrils daily.     gabapentin (NEURONTIN) 100 MG capsule Take 100  mg by mouth 3 (three) times daily. M/W/F after dialysis     glucose 4 GM chewable tablet Chew 1 tablet by mouth as needed for low blood sugar. If blood sugar less than 70. Chew 3 tablets and recheck blood sugar in 15 minutes     glucose blood test strip 1 each by Other route as needed for other. Use as instructed     JANUVIA 25 MG tablet Take 25 mg by mouth daily.     ketotifen (ZADITOR) 0.025 % ophthalmic solution Place 1 drop into both eyes 2 (two) times daily.     lanthanum (FOSRENOL) 1000 MG chewable tablet Chew 1,000 mg by mouth 2 (two) times daily with a meal.      LANTUS 100 UNIT/ML injection Inject 7 Units into the skin every evening.     loperamide (IMODIUM A-D) 2 MG tablet Take 2 mg by mouth 4 (four) times daily as needed for diarrhea or loose stools. 2 tablets by mouth for onset of loose stool, than 1 tablet by mouth after each loose stool MAX 4 tablets in 24 hours     loratadine (CLARITIN) 10 MG tablet Take 10 mg by mouth daily as needed for allergies.     NYSTATIN powder SMARTSIG:Sparingly Topical Daily PRN     polyethylene glycol (MIRALAX / GLYCOLAX) packet Take 17 g by mouth daily as needed for moderate constipation or severe constipation.     RA VITAMINS A & D EX Apply topically as needed.     rOPINIRole (REQUIP) 0.25 MG tablet Take 0.25 mg by mouth at bedtime as needed.     senna-docusate (SENOKOT-S) 8.6-50 MG tablet Take 1-2 tablets by mouth at bedtime as needed for mild constipation or moderate constipation. 1-2 tablets at bedtime if needed     Simethicone 180 MG CAPS Take 1 capsule by mouth daily.     Skin Protectants, Misc. (EUCERIN) cream Apply 1 application topically 4 (four) times daily as needed for dry skin.      Vitamins A & D (VITAMIN A & D) ointment Apply topically daily as needed.     Zinc Oxide 13 % CREA Apply 1 application topically as needed for irritation.     Methoxy PEG-Epoetin Beta (MIRCERA IJ) Mircera (Patient not taking: Reported on 05/23/2021)     Methoxy  PEG-Epoetin Beta (MIRCERA IJ) Mircera (Patient not taking: Reported on 05/23/2021)     Past Medical History:  Diagnosis Date   Arthritis    Chronic kidney disease    Diabetes mellitus without complication ([place])    Hyperlipidemia    Hypertension    Peripheral vascular disease ([place])    Past Surgical History:  Procedure Laterality Date   A/V SHUNT INTERVENTION Left 11/20/2019   Procedure: A/V SHUNT INTERVENTION (Left Thigh);  Surgeon: Algernon Huxley, MD;  Location: [place] [facility];  Service: Cardiovascular;  Laterality: Left;   A/V SHUNTOGRAM Left 02/16/2017   Procedure: A/V Shuntogram;  Surgeon: Algernon Huxley, MD;  Location: [place] [facility];  Service: Cardiovascular;  Laterality: Left;   A/V SHUNTOGRAM Left 12/13/2017   Procedure: A/V SHUNTOGRAM;  Surgeon: Algernon Huxley, MD;  Location: [place] [facility];  Service: Cardiovascular;  Laterality: Left;   A/V SHUNTOGRAM Left 12/05/2018  Procedure: A/V SHUNTOGRAM;  Surgeon: Algernon Huxley, MD;  Location: [place] [facility];  Service: Cardiovascular;  Laterality: Left;   A/V SHUNTOGRAM Left 06/03/2020   Procedure: A/V SHUNTOGRAM;  Surgeon: Algernon Huxley, MD;  Location: [place] [facility];  Service: Cardiovascular;  Laterality: Left;   ABOVE KNEE LEG AMPUTATION     AV FISTULA PLACEMENT Bilateral    AV FISTULA PLACEMENT Left 07/01/2015   Procedure: INSERTION OF ARTERIOVENOUS (AV) GRAFT THIGH (ARTEGRAFT);  Surgeon: Algernon Huxley, MD;  Location: [facility];  Service: Vascular;  Laterality: Left;   BELOW KNEE LEG AMPUTATION Left    BREAST BIOPSY     ENDARTERECTOMY FEMORAL Left 07/01/2015   Procedure: SUPERFICIAL FEMORAL ENDARTERECTOMY ;  Surgeon: Algernon Huxley, MD;  Location: [facility];  Service: Vascular;  Laterality: Left;   EXCHANGE OF A DIALYSIS CATHETER  02/11/2015   Procedure: Exchange Of A Dialysis Catheter;  Surgeon: Algernon Huxley, MD;  Location: [place] [facility];  Service: Cardiovascular;;   LEG AMPUTATION ABOVE KNEE Right    PERIPHERAL  VASCULAR CATHETERIZATION N/A 02/11/2015   Procedure: Dialysis/Perma Catheter Insertion;  Surgeon: Algernon Huxley, MD;  Location: [place] [facility];  Service: Cardiovascular;  Laterality: N/A;   PERIPHERAL VASCULAR CATHETERIZATION N/A 05/11/2015   Procedure: Dialysis/Perma Catheter Insertion, exchange;  Surgeon: Katha Cabal, MD;  Location: [place] [facility];  Service: Cardiovascular;  Laterality: N/A;   PERIPHERAL VASCULAR CATHETERIZATION N/A 05/18/2015   Procedure: Dialysis/Perma Catheter Insertion;  Surgeon: Katha Cabal, MD;  Location: [place] [facility];  Service: Cardiovascular;  Laterality: N/A;   PERIPHERAL VASCULAR CATHETERIZATION Left 07/19/2015   Procedure: A/V Shuntogram/Fistulagram;  Surgeon: Algernon Huxley, MD;  Location: [place] [facility];  Service: Cardiovascular;  Laterality: Left;   PERIPHERAL VASCULAR CATHETERIZATION N/A 07/19/2015   Procedure: A/V Shunt Intervention;  Surgeon: Algernon Huxley, MD;  Location: [place] [facility];  Service: Cardiovascular;  Laterality: N/A;   PERIPHERAL VASCULAR CATHETERIZATION N/A 08/26/2015   Procedure: Dialysis/Perma Catheter Removal;  Surgeon: Algernon Huxley, MD;  Location: [place] [facility];  Service: Cardiovascular;  Laterality: N/A;   PERIPHERAL VASCULAR THROMBECTOMY N/A 12/29/2016   Procedure: Peripheral Vascular Thrombectomy;  Surgeon: Katha Cabal, MD;  Location: [place] [facility];  Service: Cardiovascular;  Laterality: N/A;   Social History Social History   Tobacco Use   Smoking status: Never   Smokeless tobacco: Never  Vaping Use   Vaping Use: Never used  Substance Use Topics   Alcohol use: No   Drug use: No   Family History Family History  Problem Relation Age of Onset   Breast cancer Neg Hx   Denies family history of peripheral artery disease, venous disease or renal disease.  Allergies  Allergen Reactions   Ancef [Cefazolin] Palpitations   Contrast Media [Iodinated Diagnostic Agents] Palpitations    REVIEW OF SYSTEMS (Negative unless checked)  Constitutional: [] Weight loss  [] Fever  [] Chills Cardiac: [] Chest pain   [] Chest pressure   [] Palpitations   [] Shortness of breath when laying flat   [] Shortness of breath at rest   [] Shortness of breath with exertion. Vascular:  [] Pain in legs with walking   [] Pain in legs at rest   [] Pain in legs when laying flat   [] Claudication   [] Pain in feet when walking  [] Pain in feet at rest  [] Pain in feet when laying flat   [] History of DVT   [] Phlebitis   [] Swelling in legs   []   Varicose veins   [] Non-healing ulcers Pulmonary:   [] Uses home oxygen   [] Productive cough   [] Hemoptysis   [] Wheeze  [] COPD   [] Asthma Neurologic:  [] Dizziness  [] Blackouts   [] Seizures   [] History of stroke   [] History of TIA  [] Aphasia   [] Temporary blindness   [] Dysphagia   [] Weakness or numbness in arms   [] Weakness or numbness in legs Musculoskeletal:  [] Arthritis   [] Joint swelling   [] Joint pain   [] Low back pain Hematologic:  [] Easy bruising  [x] Easy bleeding   [] Hypercoagulable state   [x] Anemic  [] Hepatitis Gastrointestinal:  [] Blood in stool   [] Vomiting blood  [] Gastroesophageal reflux/heartburn   [] Difficulty swallowing. Genitourinary:  [x] Chronic kidney disease   [] Difficult urination  [] Frequent urination  [] Burning with urination   [] Blood in urine Skin:  [] Rashes   [] Ulcers   [] Wounds Psychological:  [] History of anxiety   []  History of major depression.  Physical Examination  Vitals:   05/23/21 1240 05/23/21 1305 05/23/21 1310 05/23/21 1320  BP: (!) 113/52 (!) 119/59 121/65 (!) 128/50  Pulse: 68 65 66 70  Resp: 17 17 14 15   Temp:      TempSrc:      SpO2: 98% 99% 100% 100%  Weight:       Body mass index is 42.44 kg/m. Gen:  WD/WN, NAD Head: [place]/AT, No temporalis wasting. Prominent temp pulse not noted. Ear/Nose/Throat: Hearing grossly intact, nares w/o erythema or drainage, oropharynx w/o Erythema/Exudate Eyes: Sclera non-icteric, conjunctiva  clear Neck: Trachea midline.  No JVD.  Pulmonary:  Good air movement, respirations not labored, equal bilaterally.  Cardiac: RRR, normal S1, S2. Vascular:  Vessel Right Left  Radial Palpable Palpable  Ulnar Palpable Palpable  Brachial Palpable Palpable   Left lower extremity: Dialysis access site -clean dry and intact.  No signs of active bleeding noted.  Gastrointestinal: soft, non-tender/non-distended. No guarding/reflex.  Musculoskeletal: M/S 5/5 throughout.  Extremities without ischemic changes.  No deformity or atrophy. No edema. Neurologic: Sensation grossly intact in extremities.  Symmetrical.  Speech is fluent. Motor exam as listed above. Psychiatric: Judgment intact, Mood & affect appropriate for pt's clinical situation. Dermatologic: No rashes or ulcers noted.  No cellulitis or open wounds. Lymph : No Cervical, Axillary, or Inguinal lymphadenopathy.  CBC Lab Results  Component Value Date   WBC 8.7 05/23/2021   HGB 11.9 (L) 05/23/2021   HCT 36.6 05/23/2021   MCV 81.2 05/23/2021   PLT 226 05/23/2021   BMET    Component Value Date/Time   NA 138 05/23/2021 0343   NA 138 12/03/2012 1414   K 4.2 05/23/2021 0343   K 4.5 12/03/2012 1414   CL 97 (L) 05/23/2021 0343   CL 101 12/03/2012 1414   CO2 30 05/23/2021 0343   CO2 27 12/03/2012 1414   GLUCOSE 210 (H) 05/23/2021 0343   GLUCOSE 326 (H) 12/03/2012 1414   BUN 43 (H) 05/23/2021 0343   BUN 37 (H) 12/03/2012 1414   CREATININE 9.64 (H) 05/23/2021 0343   CREATININE 7.04 (H) 12/03/2012 1414   CALCIUM 9.0 05/23/2021 0343   CALCIUM 8.4 (L) 12/03/2012 1414   GFRNONAA 4 (L) 05/23/2021 0343   GFRNONAA 6 (L) 12/03/2012 1414   GFRAA 9 (L) 11/19/2019 1002   GFRAA 7 (L) 12/03/2012 1414   Estimated Creatinine Clearance: 5.3 mL/min (A) (by C-G formula based on SCr of 9.64 mg/dL (H)).  COAG Lab Results  Component Value Date   INR 1.1 05/23/2021   INR 1.0 11/19/2019  INR 1.11 06/22/2015   Radiology No results  found.  Assessment/Plan The patient is a 70 year old female with known history of end-stage renal disease currently maintained via hemodialysis from a left thigh graft  1.  Bleeding from dialysis access: Patient presents to the [facility] emergency department after 1 episode of bleeding from her left thigh access.  Hemostasis was easily achieved.  No active bleeding on today's exam.  We will plan on left lower extremity shuntogram with possible intervention tomorrow with Dr. Delana Meyer.  Procedure, risks and benefits were explained to the patient.  All questions were answered.  Patient wishes to proceed.  2. ESRD: Nephrology has been consulted.  3.  Diabetes: On appropriate medications. Encouraged good control as its slows the progression of atherosclerotic disease.   4.  Hyperlipidemia: On aspirin and statin for medical management. Encouraged good control as its slows the progression of atherosclerotic disease.   Discussed with Dr. Francene Castle, PA-C 05/23/2021 1:39 PM  This note was created with Dragon medical transcription system.  Any error is purely unintentional.

## 2021-05-23 NOTE — ED Notes (Signed)
Pt's BP is better after IVF bolus, pt set up to eat at this time. Pt states "I feel better".

## 2021-05-23 NOTE — ED Notes (Signed)
Pt is resting comfortably in bed at this time.

## 2021-05-23 NOTE — Consult Note (Signed)
[facility] Vascular Consult Note  MRN : [id number]  Amy Wu is a 70 y.o. (11/17/1950) female who presents with chief complaint of  Chief Complaint  Patient presents with   Vascular Access Problem   History of Present Illness:  Amy Wu is a 70 year old African-American female with medical history significant for end-stage renal disease on hemodialysis Monday Wednesday Friday, hypertension, dyslipidemia, type diabetes mellitus and peripheral vascular disease, who presented to the ER for acute onset of bleeding from her left thigh dialysis AV fistula.    When she woke up she noted that it was spurting blood.  Blood was everywhere including the walls and the ceiling.  There was significant amount of blood at the scene per EMS and it was pulsatile from the leg.  They applied pressure and brought her to the ER.  By that time she was no longer bleeding.  She denies any presyncope or syncope, chest pain or palpitations or headache or dizziness but admitted to mild blurred vision then.  She is due for hemodialysis today.  ED Course: Upon presentation to the ER, blood pressure was 92/54 and later 112/61 with otherwise normal vital signs.  Labs revealed a BUN of 43 and creatinine 1.64 and hemoglobin of 11.9 compared to 10.6 on 11/19/2019 with platelets of 226.  Blood glucose was 210.  Influenza antigens came back negative as well as COVID-19 PCR.  Vascular surgery was consulted by Dr. Posey Pronto for possible vascular intervention in the setting of end-stage renal disease with an episode of bleeding from her access.   Current Facility-Administered Medications  Medication Dose Route Frequency Provider Last Rate Last Admin   0.9 %  sodium chloride infusion  250 mL Intravenous PRN Mansy, Jan A, MD       acetaminophen (TYLENOL) tablet 650 mg  650 mg Oral Q6H PRN Mansy, Jan A, MD       Or   acetaminophen (TYLENOL) suppository 650 mg  650 mg Rectal Q6H PRN Mansy, Jan A, MD        atorvastatin (LIPITOR) tablet 40 mg  40 mg Oral Daily Mansy, Jan A, MD   40 mg at 05/23/21 0109   benzonatate (TESSALON) capsule 100 mg  100 mg Oral TID PRN Mansy, Jan A, MD       calcitRIOL (ROCALTROL) capsule 0.75 mcg  0.75 mcg Oral Q M,W,F-HD Mansy, Jan A, MD   0.75 mcg at 05/23/21 1153   camphor-menthol [facility]) lotion 1 application  1 application Topical BID PRN Mansy, Jan A, MD       cholecalciferol (VITAMIN D) tablet 2,000 Units  2,000 Units Oral Daily Mansy, Jan A, MD   2,000 Units at 05/23/21 3235   cinacalcet (SENSIPAR) tablet 30 mg  30 mg Oral Q supper Mansy, Jan A, MD       diphenhydrAMINE (BENADRYL) 12.5 MG/5ML elixir 25 mg  25 mg Oral Q6H PRN Mansy, Jan A, MD       ferrous sulfate tablet 325 mg  325 mg Oral QODAY Mansy, Jan A, MD   325 mg at 05/23/21 0904   fluticasone (FLONASE) 50 MCG/ACT nasal spray 2 spray  2 spray Each Nare Daily PRN Mansy, Jan A, MD       gabapentin (NEURONTIN) capsule 100 mg  100 mg Oral Q M,W,F-1800 Mansy, Jan A, MD       glucose chewable tablet 4 g  1 tablet Oral PRN Mansy, Arvella Merles, MD  hydrocerin (EUCERIN) cream 1 application  1 application Topical QID PRN Mansy, Jan A, MD       insulin aspart (novoLOG) injection 0-9 Units  0-9 Units Subcutaneous TID PC & HS Mansy, Arvella Merles, MD   2 Units at 05/23/21 0942   [START ON 05/24/2021] ketotifen (ZADITOR) 0.025 % ophthalmic solution 1 drop  1 drop Both Eyes BID Mansy, Jan A, MD       lanthanum [facility]) chewable tablet 1,000 mg  1,000 mg Oral BID WC Mansy, Jan A, MD   1,000 mg at 05/23/21 7628   linagliptin (TRADJENTA) tablet 5 mg  5 mg Oral Daily Mansy, Jan A, MD   5 mg at 05/23/21 3151   loperamide (IMODIUM) capsule 2 mg  2 mg Oral QID PRN Mansy, Jan A, MD       loratadine (CLARITIN) tablet 10 mg  10 mg Oral Daily PRN Mansy, Jan A, MD       magnesium hydroxide (MILK OF MAGNESIA) suspension 30 mL  30 mL Oral Daily PRN Mansy, Jan A, MD       ondansetron [facility]) tablet 4 mg  4 mg Oral Q6H PRN Mansy, Jan A, MD        Or   ondansetron [facility]) injection 4 mg  4 mg Intravenous Q6H PRN Mansy, Jan A, MD       polyethylene glycol (MIRALAX / GLYCOLAX) packet 17 g  17 g Oral Daily PRN Mansy, Jan A, MD       rOPINIRole (REQUIP) tablet 0.25 mg  0.25 mg Oral QHS PRN Mansy, Jan A, MD       senna-docusate (Senokot-S) tablet 1 tablet  1 tablet Oral QHS PRN Mansy, Arvella Merles, MD       simethicone [facility]) chewable tablet 80 mg  80 mg Oral QID PRN Mansy, Jan A, MD       sodium chloride flush (NS) 0.9 % injection 3 mL  3 mL Intravenous Q12H Mansy, Jan A, MD   3 mL at 05/23/21 7616   sodium chloride flush (NS) 0.9 % injection 3 mL  3 mL Intravenous PRN Mansy, Jan A, MD       traZODone (DESYREL) tablet 25 mg  25 mg Oral QHS PRN Mansy, Arvella Merles, MD       vitamin A & D ointment   Topical Daily PRN Mansy, Arvella Merles, MD       Current Outpatient Medications  Medication Sig Dispense Refill   acetaminophen (TYLENOL) 500 MG tablet Take 1,000 mg by mouth every 8 (eight) hours as needed for moderate pain.     aspirin EC 81 MG tablet Take 81 mg by mouth daily.     atorvastatin (LIPITOR) 40 MG tablet Take 40 mg by mouth daily.     benzonatate (TESSALON) 100 MG capsule Take by mouth 3 (three) times daily as needed for cough.     calcitRIOL (ROCALTROL) 0.25 MCG capsule Take 0.25 mcg by mouth daily. Take 3 capsules by mouth three times week at dialysis     camphor-menthol [facility]) lotion Apply 1 application topically as needed for itching. Twice a day if needed     cinacalcet (SENSIPAR) 30 MG tablet Take 30 mg by mouth daily with supper.      Dextromethorphan-guaiFENesin (MUCINEX DM) 30-600 MG TB12 Take 1 tablet by mouth daily.     diphenhydrAMINE (BENADRYL) 12.5 MG/5ML elixir Take 12.5 mg by mouth every 6 (six) hours as needed.     ferrous sulfate 325 (  65 FE) MG tablet Take 325 mg by mouth every other day.     fluticasone (FLONASE) 50 MCG/ACT nasal spray Place 1 spray into both nostrils daily.     gabapentin (NEURONTIN) 100 MG capsule Take 100  mg by mouth 3 (three) times daily. M/W/F after dialysis     glucose 4 GM chewable tablet Chew 1 tablet by mouth as needed for low blood sugar. If blood sugar less than 70. Chew 3 tablets and recheck blood sugar in 15 minutes     glucose blood test strip 1 each by Other route as needed for other. Use as instructed     JANUVIA 25 MG tablet Take 25 mg by mouth daily.     ketotifen (ZADITOR) 0.025 % ophthalmic solution Place 1 drop into both eyes 2 (two) times daily.     lanthanum (FOSRENOL) 1000 MG chewable tablet Chew 1,000 mg by mouth 2 (two) times daily with a meal.      LANTUS 100 UNIT/ML injection Inject 7 Units into the skin every evening.     loperamide (IMODIUM A-D) 2 MG tablet Take 2 mg by mouth 4 (four) times daily as needed for diarrhea or loose stools. 2 tablets by mouth for onset of loose stool, than 1 tablet by mouth after each loose stool MAX 4 tablets in 24 hours     loratadine (CLARITIN) 10 MG tablet Take 10 mg by mouth daily as needed for allergies.     NYSTATIN powder SMARTSIG:Sparingly Topical Daily PRN     polyethylene glycol (MIRALAX / GLYCOLAX) packet Take 17 g by mouth daily as needed for moderate constipation or severe constipation.     RA VITAMINS A & D EX Apply topically as needed.     rOPINIRole (REQUIP) 0.25 MG tablet Take 0.25 mg by mouth at bedtime as needed.     senna-docusate (SENOKOT-S) 8.6-50 MG tablet Take 1-2 tablets by mouth at bedtime as needed for mild constipation or moderate constipation. 1-2 tablets at bedtime if needed     Simethicone 180 MG CAPS Take 1 capsule by mouth daily.     Skin Protectants, Misc. (EUCERIN) cream Apply 1 application topically 4 (four) times daily as needed for dry skin.      Vitamins A & D (VITAMIN A & D) ointment Apply topically daily as needed.     Zinc Oxide 13 % CREA Apply 1 application topically as needed for irritation.     Methoxy PEG-Epoetin Beta (MIRCERA IJ) Mircera (Patient not taking: Reported on 05/23/2021)     Methoxy  PEG-Epoetin Beta (MIRCERA IJ) Mircera (Patient not taking: Reported on 05/23/2021)     Past Medical History:  Diagnosis Date   Arthritis    Chronic kidney disease    Diabetes mellitus without complication ([place])    Hyperlipidemia    Hypertension    Peripheral vascular disease ([place])    Past Surgical History:  Procedure Laterality Date   A/V SHUNT INTERVENTION Left 11/20/2019   Procedure: A/V SHUNT INTERVENTION (Left Thigh);  Surgeon: Algernon Huxley, MD;  Location: [place] [facility];  Service: Cardiovascular;  Laterality: Left;   A/V SHUNTOGRAM Left 02/16/2017   Procedure: A/V Shuntogram;  Surgeon: Algernon Huxley, MD;  Location: [place] [facility];  Service: Cardiovascular;  Laterality: Left;   A/V SHUNTOGRAM Left 12/13/2017   Procedure: A/V SHUNTOGRAM;  Surgeon: Algernon Huxley, MD;  Location: [place] [facility];  Service: Cardiovascular;  Laterality: Left;   A/V SHUNTOGRAM Left 12/05/2018  Procedure: A/V SHUNTOGRAM;  Surgeon: Algernon Huxley, MD;  Location: [place] [facility];  Service: Cardiovascular;  Laterality: Left;   A/V SHUNTOGRAM Left 06/03/2020   Procedure: A/V SHUNTOGRAM;  Surgeon: Algernon Huxley, MD;  Location: [place] [facility];  Service: Cardiovascular;  Laterality: Left;   ABOVE KNEE LEG AMPUTATION     AV FISTULA PLACEMENT Bilateral    AV FISTULA PLACEMENT Left 07/01/2015   Procedure: INSERTION OF ARTERIOVENOUS (AV) GRAFT THIGH (ARTEGRAFT);  Surgeon: Algernon Huxley, MD;  Location: [facility];  Service: Vascular;  Laterality: Left;   BELOW KNEE LEG AMPUTATION Left    BREAST BIOPSY     ENDARTERECTOMY FEMORAL Left 07/01/2015   Procedure: SUPERFICIAL FEMORAL ENDARTERECTOMY ;  Surgeon: Algernon Huxley, MD;  Location: [facility];  Service: Vascular;  Laterality: Left;   EXCHANGE OF A DIALYSIS CATHETER  02/11/2015   Procedure: Exchange Of A Dialysis Catheter;  Surgeon: Algernon Huxley, MD;  Location: [place] [facility];  Service: Cardiovascular;;   LEG AMPUTATION ABOVE KNEE Right    PERIPHERAL  VASCULAR CATHETERIZATION N/A 02/11/2015   Procedure: Dialysis/Perma Catheter Insertion;  Surgeon: Algernon Huxley, MD;  Location: [place] [facility];  Service: Cardiovascular;  Laterality: N/A;   PERIPHERAL VASCULAR CATHETERIZATION N/A 05/11/2015   Procedure: Dialysis/Perma Catheter Insertion, exchange;  Surgeon: Katha Cabal, MD;  Location: [place] [facility];  Service: Cardiovascular;  Laterality: N/A;   PERIPHERAL VASCULAR CATHETERIZATION N/A 05/18/2015   Procedure: Dialysis/Perma Catheter Insertion;  Surgeon: Katha Cabal, MD;  Location: [place] [facility];  Service: Cardiovascular;  Laterality: N/A;   PERIPHERAL VASCULAR CATHETERIZATION Left 07/19/2015   Procedure: A/V Shuntogram/Fistulagram;  Surgeon: Algernon Huxley, MD;  Location: [place] [facility];  Service: Cardiovascular;  Laterality: Left;   PERIPHERAL VASCULAR CATHETERIZATION N/A 07/19/2015   Procedure: A/V Shunt Intervention;  Surgeon: Algernon Huxley, MD;  Location: [place] [facility];  Service: Cardiovascular;  Laterality: N/A;   PERIPHERAL VASCULAR CATHETERIZATION N/A 08/26/2015   Procedure: Dialysis/Perma Catheter Removal;  Surgeon: Algernon Huxley, MD;  Location: [place] [facility];  Service: Cardiovascular;  Laterality: N/A;   PERIPHERAL VASCULAR THROMBECTOMY N/A 12/29/2016   Procedure: Peripheral Vascular Thrombectomy;  Surgeon: Katha Cabal, MD;  Location: [place] [facility];  Service: Cardiovascular;  Laterality: N/A;   Social History Social History   Tobacco Use   Smoking status: Never   Smokeless tobacco: Never  Vaping Use   Vaping Use: Never used  Substance Use Topics   Alcohol use: No   Drug use: No   Family History Family History  Problem Relation Age of Onset   Breast cancer Neg Hx   Denies family history of peripheral artery disease, venous disease or renal disease.  Allergies  Allergen Reactions   Ancef [Cefazolin] Palpitations   Contrast Media [Iodinated Diagnostic Agents] Palpitations    REVIEW OF SYSTEMS (Negative unless checked)  Constitutional: [] Weight loss  [] Fever  [] Chills Cardiac: [] Chest pain   [] Chest pressure   [] Palpitations   [] Shortness of breath when laying flat   [] Shortness of breath at rest   [] Shortness of breath with exertion. Vascular:  [] Pain in legs with walking   [] Pain in legs at rest   [] Pain in legs when laying flat   [] Claudication   [] Pain in feet when walking  [] Pain in feet at rest  [] Pain in feet when laying flat   [] History of DVT   [] Phlebitis   [] Swelling in legs   []   Varicose veins   [] Non-healing ulcers Pulmonary:   [] Uses home oxygen   [] Productive cough   [] Hemoptysis   [] Wheeze  [] COPD   [] Asthma Neurologic:  [] Dizziness  [] Blackouts   [] Seizures   [] History of stroke   [] History of TIA  [] Aphasia   [] Temporary blindness   [] Dysphagia   [] Weakness or numbness in arms   [] Weakness or numbness in legs Musculoskeletal:  [] Arthritis   [] Joint swelling   [] Joint pain   [] Low back pain Hematologic:  [] Easy bruising  [x] Easy bleeding   [] Hypercoagulable state   [x] Anemic  [] Hepatitis Gastrointestinal:  [] Blood in stool   [] Vomiting blood  [] Gastroesophageal reflux/heartburn   [] Difficulty swallowing. Genitourinary:  [x] Chronic kidney disease   [] Difficult urination  [] Frequent urination  [] Burning with urination   [] Blood in urine Skin:  [] Rashes   [] Ulcers   [] Wounds Psychological:  [] History of anxiety   []  History of major depression.  Physical Examination  Vitals:   05/23/21 1240 05/23/21 1305 05/23/21 1310 05/23/21 1320  BP: (!) 113/52 (!) 119/59 121/65 (!) 128/50  Pulse: 68 65 66 70  Resp: 17 17 14 15   Temp:      TempSrc:      SpO2: 98% 99% 100% 100%  Weight:       Body mass index is 42.44 kg/m. Gen:  WD/WN, NAD Head: [place]/AT, No temporalis wasting. Prominent temp pulse not noted. Ear/Nose/Throat: Hearing grossly intact, nares w/o erythema or drainage, oropharynx w/o Erythema/Exudate Eyes: Sclera non-icteric, conjunctiva  clear Neck: Trachea midline.  No JVD.  Pulmonary:  Good air movement, respirations not labored, equal bilaterally.  Cardiac: RRR, normal S1, S2. Vascular:  Vessel Right Left  Radial Palpable Palpable  Ulnar Palpable Palpable  Brachial Palpable Palpable   Left lower extremity: Dialysis access site -clean dry and intact.  No signs of active bleeding noted.  Gastrointestinal: soft, non-tender/non-distended. No guarding/reflex.  Musculoskeletal: M/S 5/5 throughout.  Extremities without ischemic changes.  No deformity or atrophy. No edema. Neurologic: Sensation grossly intact in extremities.  Symmetrical.  Speech is fluent. Motor exam as listed above. Psychiatric: Judgment intact, Mood & affect appropriate for pt's clinical situation. Dermatologic: No rashes or ulcers noted.  No cellulitis or open wounds. Lymph : No Cervical, Axillary, or Inguinal lymphadenopathy.  CBC Lab Results  Component Value Date   WBC 8.7 05/23/2021   HGB 11.9 (L) 05/23/2021   HCT 36.6 05/23/2021   MCV 81.2 05/23/2021   PLT 226 05/23/2021   BMET    Component Value Date/Time   NA 138 05/23/2021 0343   NA 138 12/03/2012 1414   K 4.2 05/23/2021 0343   K 4.5 12/03/2012 1414   CL 97 (L) 05/23/2021 0343   CL 101 12/03/2012 1414   CO2 30 05/23/2021 0343   CO2 27 12/03/2012 1414   GLUCOSE 210 (H) 05/23/2021 0343   GLUCOSE 326 (H) 12/03/2012 1414   BUN 43 (H) 05/23/2021 0343   BUN 37 (H) 12/03/2012 1414   CREATININE 9.64 (H) 05/23/2021 0343   CREATININE 7.04 (H) 12/03/2012 1414   CALCIUM 9.0 05/23/2021 0343   CALCIUM 8.4 (L) 12/03/2012 1414   GFRNONAA 4 (L) 05/23/2021 0343   GFRNONAA 6 (L) 12/03/2012 1414   GFRAA 9 (L) 11/19/2019 1002   GFRAA 7 (L) 12/03/2012 1414   Estimated Creatinine Clearance: 5.3 mL/min (A) (by C-G formula based on SCr of 9.64 mg/dL (H)).  COAG Lab Results  Component Value Date   INR 1.1 05/23/2021   INR 1.0 11/19/2019  INR 1.11 06/22/2015   Radiology No results  found.  Assessment/Plan The patient is a 70 year old female with known history of end-stage renal disease currently maintained via hemodialysis from a left thigh graft  1.  Bleeding from dialysis access: Patient presents to [facility] emergency department after 1 episode of bleeding from her left thigh access.  Hemostasis was easily achieved.  No active bleeding on today's exam.  We will plan on left lower extremity shuntogram with possible intervention tomorrow with Dr. Delana Meyer.  Procedure, risks and benefits were explained to the patient.  All questions were answered.  Patient wishes to proceed.  2. ESRD: Nephrology has been consulted.  3.  Diabetes: On appropriate medications. Encouraged good control as its slows the progression of atherosclerotic disease.   4.  Hyperlipidemia: On aspirin and statin for medical management. Encouraged good control as its slows the progression of atherosclerotic disease.   Discussed with Dr. Francene Castle, PA-C 05/23/2021 1:39 PM  This note was created with Dragon medical transcription system.  Any error is purely unintentional.

## 2021-05-23 NOTE — ED Notes (Signed)
Sand bag removed at this time. No signs of bleeding at fistula site.

## 2021-05-23 NOTE — ED Notes (Signed)
Pt is requesting comfortably. RN changed linen and placed pillow under pt's leg. Pt denies any pain at this time. Call Salar Molden within reach.

## 2021-05-23 NOTE — Progress Notes (Signed)
[place] [facility] [place] NAME: Amy Wu    MR#:  [id number]  DATE OF BIRTH:  10-21-1950  SUBJECTIVE:  patient seen in the ED. Friend at bedside. Came in after she found her left lower extremity AV fistula bleeding. Currently no active bleeding. Soft blood pressure. Received bolus of IV fluid. Patient alert and oriented.  REVIEW OF SYSTEMS:   Review of Systems  Constitutional:  Negative for chills, fever and weight loss.  HENT:  Negative for ear discharge, ear pain and nosebleeds.   Eyes:  Negative for blurred vision, pain and discharge.  Respiratory:  Negative for sputum production, shortness of breath, wheezing and stridor.   Cardiovascular:  Negative for chest pain, palpitations, orthopnea and PND.  Gastrointestinal:  Negative for abdominal pain, diarrhea, nausea and vomiting.  Genitourinary:  Negative for frequency and urgency.  Musculoskeletal:  Negative for back pain and joint pain.  Neurological:  Positive for weakness. Negative for sensory change, speech change and focal weakness.  Psychiatric/Behavioral:  Negative for depression and hallucinations. The patient is not nervous/anxious.   Tolerating Diet: Tolerating PT:   DRUG ALLERGIES:   Allergies  Allergen Reactions   Ancef [Cefazolin] Palpitations   Contrast Media [Iodinated Diagnostic Agents] Palpitations    VITALS:  Blood pressure (!) 129/91, pulse 94, temperature 99 F (37.2 C), temperature source Oral, resp. rate 16, weight 92.1 kg, SpO2 100 %.  PHYSICAL EXAMINATION:   Physical Exam  GENERAL:  70 y.o.-year-old patient lying in the bed with no acute distress. The HEENT: Head atraumatic, normocephalic. Oropharynx and nasopharynx clear.  LUNGS: Normal breath sounds bilaterally, no wheezing, rales, rhonchi. No use of accessory muscles of respiration.  CARDIOVASCULAR: S1, S2 normal. No murmurs, rubs, or gallops.  ABDOMEN: Soft, nontender, nondistended. Bowel sounds  present. No organomegaly or mass.  EXTREMITIES:    No bleeding at left AV fistula site NEUROLOGIC: nonfocal PSYCHIATRIC:  patient is alert and oriented x 3.  SKIN: No obvious rash, lesion, or ulcer.   LABORATORY PANEL:  CBC Recent Labs  Lab 05/23/21 0343  WBC 8.7  HGB 11.9*  HCT 36.6  PLT 226    Chemistries  Recent Labs  Lab 05/23/21 0343  NA 138  K 4.2  CL 97*  CO2 30  GLUCOSE 210*  BUN 43*  CREATININE 9.64*  CALCIUM 9.0   Cardiac Enzymes No results for input(s): TROPONINI in the last 168 hours. RADIOLOGY:  No results found. ASSESSMENT AND PLAN:  [name] is a 70 y.o. African-American female with medical history significant for end-stage renal disease on hemodialysis Monday Wednesday Friday, hypertension, dyslipidemia, type diabetes mellitus and peripheral vascular disease, who presented to the ER for acute onset of bleeding from her left thigh dialysis AV fistula.  When she woke up she noted that it was spurting blood.  Left femoral AV graft bleeding -- hemoglobin stable 11.1 -- no active bleeding -- seen by vascular surgery Dr. Delana Meyer-- plans to do fistulogram tomorrow  end-stage renal disease on hemodialysis -- Dr. Juleen China consulted for in-house dialysis  Hypotension -- improved after IV fluid bolus in the ER -- hold BP meds  Dyslipidemia continue statins  type II diabetes with peripheral neuropathy and end-stage renal disease -- sliding scale insulin -- will resume home meds at discharge  restless leg syndrome -- continue requip  Procedures: Family communication : Consults : CODE STATUS:  DVT Prophylaxis : Level of care: Telemetry Medical Status is: Inpatient  TOTAL TIME TAKING CARE OF THIS PATIENT: 25 minutes.  >50% time spent on counselling and coordination of care  Note: This dictation was prepared with Dragon dictation along with smaller phrase technology. Any transcriptional errors that result from this process are  unintentional.  Fritzi Mandes M.D    [facility]   CC: Primary care physician; System, Provider Not In Patient ID: RILLEY POULTER, female   DOB: Jun 23, 1950, 70 y.o.   MRN: [id number]

## 2021-05-23 NOTE — Progress Notes (Addendum)
Messaged MD regarding new onst chest pain and confusion.

## 2021-05-23 NOTE — ED Notes (Addendum)
Hemastatic dressing applied and sandbag placed on fistula site.

## 2021-05-23 NOTE — ED Triage Notes (Signed)
Pt brought in from home for her left femoral dialysis fistula bleeding uncontrollably. Do signs  of bleeding at this time. Thrill palpated. Pt is alert and oriented times 4.

## 2021-05-23 NOTE — ED Provider Notes (Signed)
[facility] Emergency Department Provider Note  ____________________________________________   Event Date/Time   First MD Initiated Contact with Patient 05/23/21 [phone]     (approximate)  I have reviewed the triage vital signs and the nursing notes.   HISTORY  Chief Complaint Vascular Access Problem    HPI Amy Wu is a 70 y.o. female with medical history as listed below which notably includes chronic kidney disease on dialysis Mondays, Wednesdays, and Fridays.  She presents by EMS as emergency traffic for a cute onset pulsatile bleeding from her left lower extremity dialysis fistula.  The patient states that she woke up and saw that it was spurting blood and there was blood "everywhere, on the walls, on the ceiling." Paramedics confirmed that there was a large volume of blood at the scene and that it was pulsatile from the leg.  They applied pressure and brought her to the emergency department.  By the time she arrived in the emergency department it was no longer bleeding.  The patient denies any symptoms including lightheadedness, passing out, chest pain, shortness of breath, nausea, vomiting, and abdominal pain.  She reports that she is due for dialysis this morning.    The onset of the symptoms was acute and severe and direct pressure made it better, nothing in particular made it worse.     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[place]) 03/05/2014   Mild protein-calorie malnutrition ([place]) 09/22/2013   Hyperlipidemia, unspecified 03/30/2011   Dependence  on renal dialysis ([place]) 03/30/2011   Other obesity due to excess calories 03/30/2011   Old myocardial infarction 08/09/2010   Other hereditary and idiopathic neuropathies 08/09/2010   Secondary hyperparathyroidism of renal origin ([place]) 04/13/2003   Hypertensive chronic kidney disease with stage 5 chronic kidney disease or end stage renal disease ([place]) 03/08/2002   Pure hypercholesterolemia 03/08/2002   Type 2 diabetes mellitus ([place]) 03/04/2002   End stage renal disease ([place]) 03/04/2002    Past Surgical History:  Procedure Laterality Date   A/V SHUNT INTERVENTION Left 11/20/2019   Procedure: A/V SHUNT INTERVENTION (Left Thigh);  Surgeon: Algernon Huxley, MD;  Location: [place] [facility];  Service: Cardiovascular;  Laterality: Left;   A/V SHUNTOGRAM Left 02/16/2017   Procedure: A/V Shuntogram;  Surgeon: Algernon Huxley, MD;  Location: [place] [facility];  Service: Cardiovascular;  Laterality: Left;   A/V SHUNTOGRAM Left 12/13/2017   Procedure: A/V SHUNTOGRAM;  Surgeon: Algernon Huxley, MD;  Location: [place] [facility];  Service: Cardiovascular;  Laterality: Left;   A/V SHUNTOGRAM Left 12/05/2018   Procedure: A/V SHUNTOGRAM;  Surgeon: Algernon Huxley, MD;  Location: [place] [facility];  Service: Cardiovascular;  Laterality: Left;   A/V SHUNTOGRAM Left 06/03/2020   Procedure: A/V SHUNTOGRAM;  Surgeon: Algernon Huxley, MD;  Location: [place] [facility];  Service: Cardiovascular;  Laterality: Left;   ABOVE KNEE LEG AMPUTATION     AV FISTULA PLACEMENT Bilateral    AV FISTULA PLACEMENT Left 07/01/2015   Procedure: INSERTION OF ARTERIOVENOUS (AV) GRAFT THIGH (ARTEGRAFT);  Surgeon: Algernon Huxley, MD;  Location: [facility];  Service: Vascular;  Laterality: Left;   BELOW KNEE LEG AMPUTATION Left    BREAST BIOPSY     ENDARTERECTOMY FEMORAL Left 07/01/2015   Procedure: SUPERFICIAL FEMORAL ENDARTERECTOMY ;  Surgeon: Algernon Huxley, MD;  Location: [facility];  Service: Vascular;  Laterality: Left;   EXCHANGE OF A  DIALYSIS CATHETER  02/11/2015   Procedure: Exchange Of A Dialysis Catheter;  Surgeon: Algernon Huxley, MD;  Location: [place] [facility];  Service: Cardiovascular;;   LEG AMPUTATION ABOVE KNEE Right    PERIPHERAL VASCULAR CATHETERIZATION N/A 02/11/2015   Procedure: Dialysis/Perma Catheter Insertion;  Surgeon: Algernon Huxley, MD;  Location: [place] [facility];  Service: Cardiovascular;  Laterality: N/A;   PERIPHERAL VASCULAR CATHETERIZATION N/A 05/11/2015   Procedure: Dialysis/Perma Catheter Insertion, exchange;  Surgeon: Katha Cabal, MD;  Location: [place] [facility];  Service: Cardiovascular;  Laterality: N/A;   PERIPHERAL VASCULAR CATHETERIZATION N/A 05/18/2015   Procedure: Dialysis/Perma Catheter Insertion;  Surgeon: Katha Cabal, MD;  Location: [place] [facility];  Service: Cardiovascular;  Laterality: N/A;   PERIPHERAL VASCULAR CATHETERIZATION Left 07/19/2015   Procedure: A/V Shuntogram/Fistulagram;  Surgeon: Algernon Huxley, MD;  Location: [place] [facility];  Service: Cardiovascular;  Laterality: Left;   PERIPHERAL VASCULAR CATHETERIZATION N/A 07/19/2015   Procedure: A/V Shunt Intervention;  Surgeon: Algernon Huxley, MD;  Location: [place] [facility];  Service: Cardiovascular;  Laterality: N/A;   PERIPHERAL VASCULAR CATHETERIZATION N/A 08/26/2015   Procedure: Dialysis/Perma Catheter Removal;  Surgeon: Algernon Huxley, MD;  Location: [place] [facility];  Service: Cardiovascular;  Laterality: N/A;   PERIPHERAL VASCULAR THROMBECTOMY N/A 12/29/2016   Procedure: Peripheral Vascular Thrombectomy;  Surgeon: Katha Cabal, MD;  Location: [place] [facility];  Service: Cardiovascular;  Laterality: N/A;    Prior to Admission medications   Medication Sig Start Date End Date Taking? Authorizing Provider  acetaminophen (TYLENOL) 500 MG tablet Take 1,000 mg by mouth every 8 (eight) hours as needed for moderate pain.    [provider]  aspirin 81 MG EC tablet Take by mouth.    [provider]  aspirin 81 MG tablet Take 81 mg by mouth daily.    [provider]  aspirin EC 81 MG tablet Take 81 mg by mouth daily. 11/11/20   [provider]  atorvastatin (LIPITOR) 40 MG tablet Take 40 mg by mouth daily.    [provider]  atorvastatin (LIPITOR) 40 MG tablet Take by mouth. 10/28/10   [provider]  benzonatate (TESSALON) 100 MG capsule Take by mouth 3 (three) times daily as needed for cough.    [provider]  calcitRIOL (ROCALTROL) 0.25 MCG capsule Take 0.25 mcg by mouth daily. Take 3 capsules by mouth three times week at dialysis    [provider]  camphor-menthol Timoteo Ace) lotion Apply 1 application topically as needed for itching. Twice a day if needed    [provider]  Cholecalciferol (VITAMIN D3) 2000 units TABS Take 2,000 Units by mouth daily.    [provider]  cinacalcet (SENSIPAR) 30 MG tablet Take 30 mg by mouth daily with supper.     [provider]  Dextromethorphan-guaiFENesin [facility] DM) 30-600 MG TB12 Take by mouth. 04/30/17   [provider]  diphenhydrAMINE (BENADRYL) 12.5 MG/5ML elixir Take by mouth. 06/14/20 06/13/21  [provider]  ferrous sulfate 325 (65 FE) MG tablet Take 325 mg  by mouth every other day.    [provider]  fluticasone (FLONASE) 50 MCG/ACT nasal spray Place into both nostrils daily.    [provider]  gabapentin (NEURONTIN) 100 MG capsule Take 100 mg by mouth 3 (three) times daily. M/W/F after dialysis    [provider]  glucose 4 GM chewable tablet Chew 1 tablet by mouth as needed for low blood sugar. If blood sugar less than 70. Chew 3 tablets and recheck blood sugar in 15 minutes    [provider]  glucose blood test strip 1 each by Other route as needed for other. Use as instructed    [provider]  insulin aspart (NOVOLOG) 100 UNIT/ML injection Inject 1 Units into the skin See  admin instructions. Pt uses insulin PRN for Blood Sugar greater then 200 uses 1 unit if greater then 200    [provider]  JANUVIA 25 MG tablet Take 25 mg by mouth daily. 11/02/20   [provider]  ketotifen (ZADITOR) 0.025 % ophthalmic solution Place 1 drop into both eyes 2 (two) times daily.    [provider]  lanthanum (FOSRENOL) 1000 MG chewable tablet Chew 1,000 mg by mouth 2 (two) times daily with a meal.     [provider]  loperamide (IMODIUM A-D) 2 MG tablet Take 2 mg by mouth 4 (four) times daily as needed for diarrhea or loose stools. 2 tablets by mouth for onset of loose stool, than 1 tablet by mouth after each loose stool MAX 4 tablets in 24 hours    [provider]  loratadine (CLARITIN) 10 MG tablet Take 10 mg by mouth daily as needed for allergies.    [provider]  Methoxy PEG-Epoetin Beta (MIRCERA IJ) Mircera 07/21/20 07/20/21  [provider]  Methoxy PEG-Epoetin Beta (MIRCERA IJ) Mircera 07/07/20 07/06/21  [provider]  NYSTATIN powder SMARTSIG:Sparingly Topical Daily PRN 11/18/20   [provider]  polyethylene glycol (MIRALAX / GLYCOLAX) packet Take 17 g by mouth daily as needed for moderate constipation or severe constipation.    [provider]  RA VITAMINS A & D EX Apply topically as needed.    [provider]  rOPINIRole (REQUIP) 0.25 MG tablet Take 0.25 mg by mouth at bedtime as needed.    [provider]  senna-docusate (SENOKOT-S) 8.6-50 MG tablet Take 1-2 tablets by mouth at bedtime as needed for mild constipation or moderate constipation. 1-2 tablets at bedtime if needed    [provider]  Simethicone 180 MG CAPS Take by mouth.    [provider]  Skin Protectants, Misc. (EUCERIN) cream Apply 1 application topically 4 (four) times daily as needed for dry skin.     [provider]  Vitamins A & D (VITAMIN A & D) ointment Apply topically  daily as needed. 08/31/20   [provider]  Zinc Oxide 13 % CREA Apply 1 application topically as needed for irritation.    [provider]    Allergies Ancef [cefazolin] and Contrast media [iodinated diagnostic agents]  Family History  Problem Relation Age of Onset   Breast cancer Neg Hx     Social History Social History   Tobacco Use   Smoking status: Never   Smokeless tobacco: Never  Vaping Use   Vaping Use: Never used  Substance Use Topics   Alcohol use: No   Drug use: No    Review of Systems Constitutional: No fever/chills Eyes: No visual changes.  ENT: No sore throat. Cardiovascular: Acute onset arterial bleed from left leg dialysis fistula. Respiratory: Denies shortness of breath. Gastrointestinal: No abdominal pain.  No nausea, no vomiting.  No diarrhea.  No constipation. Genitourinary: Negative for dysuria. Musculoskeletal: Negative for neck pain.  Negative for back pain. Integumentary: Negative for rash. Neurological: Negative for headaches, focal weakness or numbness.   ____________________________________________   PHYSICAL EXAM:  VITAL SIGNS: ED Triage Vitals  Enc Vitals Group     BP 05/23/21 0336 (!) 92/54     Pulse Rate 05/23/21 0336 83     Resp 05/23/21 0336 17     Temp 05/23/21 0336 99 F (37.2 C)     Temp Source 05/23/21 0336 Oral     SpO2 05/23/21 0336 100 %     Weight 05/23/21 0342 92.1 kg (203 lb 0.7 oz)     Height --      Head Circumference --      Peak Flow --      Pain Score 05/23/21 0342 0     Pain Loc --      Pain Edu? --      Excl. in [place]? --     Constitutional: Alert and oriented.  In good spirits, conversant and making jokes. Eyes: Conjunctivae are normal.  Head: Atraumatic. Nose: No congestion/rhinnorhea. Mouth/Throat: Patient is wearing a mask. Neck: No stridor.  No meningeal signs.   Cardiovascular: Normal rate, regular rhythm. Good peripheral circulation.  There is a small pinhole sized wound over  her left femoral dialysis fistula.  There is no swelling or hematoma.  There is a palpable thrill.  No active bleeding but the patient came in with heavily bloodsoaked garments and blanket. Respiratory: Normal respiratory effort.  No retractions. Gastrointestinal: Soft and nontender. No distention.  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  Neurologic:  Normal speech and language. No gross focal neurologic deficits are appreciated.  Skin:  Skin is warm, dry and intact. Psychiatric: Mood and affect are normal. Speech and behavior are normal.  ____________________________________________   LABS (all labs ordered are listed, but only abnormal results are displayed)  Labs Reviewed  CBC WITH DIFFERENTIAL/PLATELET - Abnormal; Notable for the following components:      Result Value   Hemoglobin 11.9 (*)    RDW 16.3 (*)    Eosinophils Absolute 0.6 (*)    All other components within normal limits  BASIC METABOLIC PANEL - Abnormal; Notable for the following components:   Chloride 97 (*)    Glucose, Bld 210 (*)    BUN 43 (*)    Creatinine, Ser 9.64 (*)    GFR, Estimated 4 (*)    All other components within normal limits  RESP PANEL BY RT-PCR (FLU A&B, COVID) ARPGX2  PROTIME-INR  APTT  HIV ANTIBODY (ROUTINE TESTING W REFLEX)  TYPE AND SCREEN  TYPE AND SCREEN   ____________________________________________  EKG  ED ECG REPORT I, Hinda Kehr, the attending physician, personally viewed and interpreted this ECG.  Date: 05/23/2021 EKG Time: 3:37 AM Rate: 84 Rhythm: normal sinus rhythm QRS Axis: normal Intervals: Short PR interval, left anterior fascicular block, LVH ST/T Wave abnormalities: Non-specific ST segment / T-wave changes, but no clear evidence of acute ischemia. Narrative Interpretation: no definitive evidence of acute ischemia; does not meet STEMI  criteria.  ____________________________________________   PROCEDURES   Procedure(s) performed (including Critical Care):  .1-3 Lead EKG Interpretation Performed by: Hinda Kehr, MD  Authorized by: Hinda Kehr, MD     Interpretation: normal     ECG rate:  80   ECG rate assessment: normal     Rhythm: sinus rhythm     Ectopy: none     Conduction: normal     ____________________________________________   INITIAL IMPRESSION / MDM / ASSESSMENT AND PLAN / ED COURSE  As part of my medical decision making, I reviewed the following data within the [place] notes reviewed and incorporated, Labs reviewed , Old chart reviewed, Discussed with admitting physician , Discussed with nephrologist (Dr. Theador Hawthorne), and Notes from prior ED visits   Differential diagnosis includes, but is not limited to, bleeding arterial/dialysis fistula wound, infected graft, acute blood loss, shock.  The patient is on the cardiac monitor to evaluate for evidence of arrhythmia and/or significant heart rate changes.  Patient has no mental status changes and no complaints at this time.  Blood pressure is on the low side but appropriate and stable.  Heart rate is normal and vital signs are generally reassuring.  Patient has good peripheral circulation and no bleeding at this time with a palpable thrill from her graft.  I applied a small piece of QuikClot over the pinhole sized wound and applied a Tegaderm over the top of that.  I then placed a sandbag for pressure for 15 minutes before removing it.  She still has a palpable thrill and still is not bleeding.  She has no complaints.  Basic metabolic panel is generally reassuring with chronically elevated BUN and creatinine but essentially normal electrolytes including potassium.  CBC is also reassuring with a hemoglobin of 11.9 / hematocrit 36.6.  I will observe the patient in the emergency department for least a couple of hours and plan to  repeat a CBC.  However that may also be something that can be done at dialysis.       Clinical Course as of 05/23/21 3299  [facility] May 23, 2021  0447 Patient has not had any additional bleeding and remains in good spirits and asymptomatic.  I discussed the case by phone with Dr. Theador Hawthorne who is covering nephrology.  I explained the situation and he said that we should not send the patient home given the potential for life-threatening bleeding if she were to start bleeding again.  He will put her on the dialysis list for later this morning and recommended admission to the hospitalist service.  He agreed with the suggestion that the hospitalist team consult vascular surgery as well given the bleeding from the graft and the fact that the patient is well-known to Dr. Lucky Cowboy and Dr. Delana Meyer.  I have dated the patient and her significant other and they agree with the plan.  No additional bleeding.  I will consult the hospitalist for admission. [CF]  [phone] Discussed case by phone with Dr. Sidney Ace who will admit. [CF]    Clinical Course User Index [CF] Hinda Kehr, MD     ____________________________________________  FINAL CLINICAL IMPRESSION(S) / ED DIAGNOSES  Final diagnoses:  Bleeding from dialysis shunt, initial encounter ([place])  ESRD on hemodialysis ([place])     MEDICATIONS GIVEN DURING THIS VISIT:  Medications  atorvastatin (LIPITOR) tablet 40 mg (has no administration in time range)  calcitRIOL (ROCALTROL) capsule 0.25 mcg (has no administration in time range)  cinacalcet (SENSIPAR) tablet 30 mg (has no administration in time range)  glucose chewable tablet 4 g (has no administration in time range)  linagliptin (TRADJENTA) tablet 5 mg (  has no administration in time range)  lanthanum (FOSRENOL) chewable tablet 1,000 mg (has no administration in time range)  loperamide (IMODIUM A-D) tablet 2 mg (has no administration in time range)  polyethylene glycol (MIRALAX / GLYCOLAX) packet 17 g (has no  administration in time range)  simethicone (MYLICON) chewable tablet 80 mg (has no administration in time range)  senna-docusate (Senokot-S) tablet 1-2 tablet (has no administration in time range)  ferrous sulfate tablet 325 mg (has no administration in time range)  gabapentin (NEURONTIN) capsule 100 mg (has no administration in time range)  rOPINIRole (REQUIP) tablet 0.25 mg (has no administration in time range)  Vitamin D3 TABS 2,000 Units (has no administration in time range)  benzonatate (TESSALON) capsule 100 mg (has no administration in time range)  diphenhydrAMINE (BENADRYL) 12.5 MG/5ML elixir 25 mg (has no administration in time range)  fluticasone (FLONASE) 50 MCG/ACT nasal spray 2 spray (has no administration in time range)  loratadine (CLARITIN) tablet 10 mg (has no administration in time range)  camphor-menthol (SARNA) lotion 1 application (has no administration in time range)  ketotifen (ZADITOR) 0.025 % ophthalmic solution 1 drop (has no administration in time range)  eucerin cream 1 application (has no administration in time range)  vitamin A & D ointment (has no administration in time range)  sodium chloride flush (NS) 0.9 % injection 3 mL (has no administration in time range)  sodium chloride flush (NS) 0.9 % injection 3 mL (has no administration in time range)  0.9 %  sodium chloride infusion (has no administration in time range)  acetaminophen (TYLENOL) tablet 650 mg (has no administration in time range)    Or  acetaminophen (TYLENOL) suppository 650 mg (has no administration in time range)  traZODone (DESYREL) tablet 25 mg (has no administration in time range)  magnesium hydroxide (MILK OF MAGNESIA) suspension 30 mL (has no administration in time range)  ondansetron (ZOFRAN) tablet 4 mg (has no administration in time range)    Or  ondansetron (ZOFRAN) injection 4 mg (has no administration in time range)     ED Discharge Orders     None        Note:  This  document was prepared using Dragon voice recognition software and may include unintentional dictation errors.   Hinda Kehr, MD 05/23/21 [phone]

## 2021-05-23 NOTE — Progress Notes (Signed)
Patient with scheduled 3 hr. HD session via left upper thigh graft. Graft cannulated with difficulty, being stuck x2 on arterial site, blood clots present, nevertheless, cannulation successful. Pt able to initiate treatment at prescribed BFR and DFR. However, B/P was low and interventions were such that treatment ended early, without fluid removal, and a reduction in BFR and DFR. Pt was given NS bolus, UF turn off, and reduced DFR. Pt. returned to ED in stable condition, report given to the charge nurse, pt taken to ED room 34.

## 2021-05-23 NOTE — ED Notes (Addendum)
After multiple attempts of getting BP, pt is still hypotensive. MD at beside. 570mL normal saline bolus given.

## 2021-05-23 NOTE — Progress Notes (Signed)
MD at bedside, orders placed

## 2021-05-24 ENCOUNTER — Inpatient Hospital Stay: Payer: Medicare (Managed Care)

## 2021-05-24 ENCOUNTER — Encounter
Admission: EM | Disposition: A | Discharge status: Home / Self Care | Payer: Self-pay | Source: Home / Self Care | Attending: Internal Medicine

## 2021-05-24 ENCOUNTER — Encounter: Payer: Self-pay | Admitting: Family Medicine

## 2021-05-24 DIAGNOSIS — N185 Chronic kidney disease, stage 5: Secondary | ICD-10-CM

## 2021-05-24 DIAGNOSIS — T82898A Other specified complication of vascular prosthetic devices, implants and grafts, initial encounter: Secondary | ICD-10-CM

## 2021-05-24 DIAGNOSIS — I959 Hypotension, unspecified: Secondary | ICD-10-CM

## 2021-05-24 HISTORY — PX: A/V SHUNTOGRAM: CATH118297

## 2021-05-24 LAB — GLUCOSE, CAPILLARY
Glucose-Capillary: 176 mg/dL — ABNORMAL HIGH (ref 70–99)
Glucose-Capillary: 139 mg/dL — ABNORMAL HIGH (ref 70–99)
Glucose-Capillary: 140 mg/dL — ABNORMAL HIGH (ref 70–99)
Glucose-Capillary: 119 mg/dL — ABNORMAL HIGH (ref 70–99)
Glucose-Capillary: 307 mg/dL — ABNORMAL HIGH (ref 70–99)

## 2021-05-24 LAB — BASIC METABOLIC PANEL
Anion gap: 10 (ref 5–15)
BUN: 36 mg/dL — ABNORMAL HIGH (ref 8–23)
CO2: 28 mmol/L (ref 22–32)
Calcium: 8.8 mg/dL — ABNORMAL LOW (ref 8.9–10.3)
Chloride: 97 mmol/L — ABNORMAL LOW (ref 98–111)
Creatinine, Ser: 8.05 mg/dL — ABNORMAL HIGH (ref 0.44–1.00)
GFR, Estimated: 5 mL/min — ABNORMAL LOW (ref >60)
Glucose, Bld: 210 mg/dL — ABNORMAL HIGH (ref 70–99)
Potassium: 3.8 mmol/L (ref 3.5–5.1)
Sodium: 135 mmol/L (ref 135–145)

## 2021-05-24 LAB — CBC
HCT: 27.8 % — ABNORMAL LOW (ref 36.0–46.0)
Hemoglobin: 9.3 g/dL — ABNORMAL LOW (ref 12.0–15.0)
MCH: 26.6 pg (ref 26.0–34.0)
MCHC: 33.5 g/dL (ref 30.0–36.0)
MCV: 79.4 fL — ABNORMAL LOW (ref 80.0–100.0)
Platelets: 198 10*3/uL (ref 150–400)
RBC: 3.5 MIL/uL — ABNORMAL LOW (ref 3.87–5.11)
RDW: 16 % — ABNORMAL HIGH (ref 11.5–15.5)
WBC: 8 10*3/uL (ref 4.0–10.5)
nRBC: 0 % (ref 0.0–0.2)

## 2021-05-24 LAB — HEPATITIS B SURFACE ANTIGEN: Hepatitis B Surface Ag: NONREACTIVE

## 2021-05-24 LAB — PHOSPHORUS: Phosphorus: 2.5 mg/dL (ref 2.5–4.6)

## 2021-05-24 LAB — TROPONIN I (HIGH SENSITIVITY): Troponin I (High Sensitivity): 25 ng/L — ABNORMAL HIGH (ref <18)

## 2021-05-24 LAB — HEPATITIS B SURFACE ANTIBODY,QUALITATIVE: Hep B S Ab: REACTIVE — AB

## 2021-05-24 SURGERY — A/V SHUNTOGRAM
Anesthesia: Moderate Sedation | Laterality: Left

## 2021-05-24 MED ORDER — HEPARIN SODIUM (PORCINE) 1000 UNIT/ML DIALYSIS: 1000.0000 [IU] | INTRAMUSCULAR | Status: DC | PRN

## 2021-05-24 MED ORDER — MIDAZOLAM HCL 2 MG/2ML IJ SOLN
INTRAMUSCULAR | Status: DC | PRN
  Administered 2021-05-24: 1 mg via INTRAVENOUS

## 2021-05-24 MED ORDER — SODIUM CHLORIDE 0.9 % IV SOLN: 100.0000 mL | INTRAVENOUS | Status: DC | PRN

## 2021-05-24 MED ORDER — HYDRALAZINE HCL 20 MG/ML IJ SOLN
INTRAMUSCULAR | Status: AC
  Administered 2021-05-24: 20 mg
  Filled 2021-05-24: qty 1

## 2021-05-24 MED ORDER — DIPHENHYDRAMINE HCL 50 MG/ML IJ SOLN
INTRAMUSCULAR | Status: AC
  Administered 2021-05-24: 50 mg via INTRAVENOUS
  Filled 2021-05-24: qty 1

## 2021-05-24 MED ORDER — IODIXANOL 320 MG/ML IV SOLN
INTRAVENOUS | Status: DC | PRN
  Administered 2021-05-24: 15 mL

## 2021-05-24 MED ORDER — SODIUM CHLORIDE 0.9 % IV SOLN: INTRAVENOUS | Status: DC

## 2021-05-24 MED ORDER — MIDAZOLAM HCL 2 MG/ML PO SYRP: 8.0000 mg | ORAL_SOLUTION | Freq: Once | ORAL | Status: DC | PRN

## 2021-05-24 MED ORDER — FAMOTIDINE 20 MG PO TABS: 40.0000 mg | ORAL_TABLET | Freq: Once | ORAL | Status: AC | PRN

## 2021-05-24 MED ORDER — HEPARIN SODIUM (PORCINE) 1000 UNIT/ML IJ SOLN
INTRAMUSCULAR | Status: AC
  Filled 2021-05-24: qty 10

## 2021-05-24 MED ORDER — METHYLPREDNISOLONE SODIUM SUCC 125 MG IJ SOLR: 125.0000 mg | Freq: Once | INTRAMUSCULAR | Status: AC | PRN

## 2021-05-24 MED ORDER — ONDANSETRON HCL 4 MG/2ML IJ SOLN: 4.0000 mg | Freq: Four times a day (QID) | INTRAMUSCULAR | Status: DC | PRN

## 2021-05-24 MED ORDER — MIDAZOLAM HCL 2 MG/2ML IJ SOLN
INTRAMUSCULAR | Status: AC
  Filled 2021-05-24: qty 2

## 2021-05-24 MED ORDER — FENTANYL CITRATE (PF) 100 MCG/2ML IJ SOLN
INTRAMUSCULAR | Status: AC
  Filled 2021-05-24: qty 2

## 2021-05-24 MED ORDER — LIDOCAINE HCL (PF) 1 % IJ SOLN
5.0000 mL | INTRAMUSCULAR | Status: DC | PRN
  Filled 2021-05-24: qty 5

## 2021-05-24 MED ORDER — DIPHENHYDRAMINE HCL 50 MG/ML IJ SOLN: 50.0000 mg | Freq: Once | INTRAMUSCULAR | Status: AC | PRN

## 2021-05-24 MED ORDER — METHYLPREDNISOLONE SODIUM SUCC 125 MG IJ SOLR
INTRAMUSCULAR | Status: AC
  Administered 2021-05-24: 125 mg via INTRAVENOUS
  Filled 2021-05-24: qty 2

## 2021-05-24 MED ORDER — FAMOTIDINE 20 MG PO TABS
ORAL_TABLET | ORAL | Status: AC
  Administered 2021-05-24: 40 mg via ORAL
  Filled 2021-05-24: qty 2

## 2021-05-24 MED ORDER — HYDRALAZINE HCL 20 MG/ML IJ SOLN
10.0000 mg | Freq: Once | INTRAMUSCULAR | Status: DC
  Filled 2021-05-24: qty 1

## 2021-05-24 MED ORDER — FENTANYL CITRATE (PF) 100 MCG/2ML IJ SOLN
INTRAMUSCULAR | Status: DC | PRN
  Administered 2021-05-24: 50 ug via INTRAVENOUS

## 2021-05-24 MED ORDER — HEPARIN SODIUM (PORCINE) 1000 UNIT/ML IJ SOLN
INTRAMUSCULAR | Status: DC | PRN
  Administered 2021-05-24: 4000 [IU] via INTRAVENOUS

## 2021-05-24 MED ORDER — CLINDAMYCIN PHOSPHATE 300 MG/50ML IV SOLN
INTRAVENOUS | Status: AC
  Administered 2021-05-24: 300 mg via INTRAVENOUS
  Filled 2021-05-24: qty 50

## 2021-05-24 MED ORDER — LIDOCAINE-PRILOCAINE 2.5-2.5 % EX CREA
1.0000 "application " | TOPICAL_CREAM | CUTANEOUS | Status: DC | PRN
  Filled 2021-05-24: qty 5

## 2021-05-24 MED ORDER — HYDROMORPHONE HCL 1 MG/ML IJ SOLN: 1.0000 mg | Freq: Once | INTRAMUSCULAR | Status: DC | PRN

## 2021-05-24 MED ORDER — CLINDAMYCIN PHOSPHATE 300 MG/50ML IV SOLN
300.0000 mg | INTRAVENOUS | Status: AC
  Filled 2021-05-24: qty 50

## 2021-05-24 MED ORDER — ALTEPLASE 2 MG IJ SOLR: 2.0000 mg | Freq: Once | INTRAMUSCULAR | Status: DC | PRN

## 2021-05-24 MED ORDER — PENTAFLUOROPROP-TETRAFLUOROETH EX AERO
1.0000 "application " | INHALATION_SPRAY | CUTANEOUS | Status: DC | PRN
  Filled 2021-05-24: qty 30

## 2021-05-24 SURGICAL SUPPLY — 16 items
BALLN ATG 12X4X80 (BALLOONS)
BALLN DORADO 10X40X80 (BALLOONS) ×2
BALLN DORADO 8X60X80 (BALLOONS) ×2
BALLN LUTONIX AV 6X60X75 (BALLOONS) ×2
BALLOON ATG 12X4X80 (BALLOONS) IMPLANT
BALLOON DORADO 10X40X80 (BALLOONS) ×1 IMPLANT
BALLOON DORADO 8X60X80 (BALLOONS) ×1 IMPLANT
BALLOON LUTONIX AV 6X60X75 (BALLOONS) ×1 IMPLANT
CANNULA 5F STIFF (CANNULA) ×2 IMPLANT
COVER PROBE U/S 5X48 (MISCELLANEOUS) ×2 IMPLANT
KIT ENCORE 26 ADVANTAGE (KITS) ×2 IMPLANT
PACK ANGIOGRAPHY (CUSTOM PROCEDURE TRAY) ×2 IMPLANT
SHEATH BRITE TIP 6FRX5.5 (SHEATH) ×2 IMPLANT
SHEATH BRITE TIP 7FRX5.5 (SHEATH) ×2 IMPLANT
SUT MNCRL AB 4-0 PS2 18 (SUTURE) ×2 IMPLANT
WIRE MAGIC TORQUE 260C (WIRE) ×2 IMPLANT

## 2021-05-24 NOTE — Plan of Care (Signed)

## 2021-05-24 NOTE — Progress Notes (Signed)
Hemodialysis patient known at [facility] MWF 5:30am. Per patient, [facility] transports to treatment and [facility] transports home. Education provided, patient stated no dialysis concerns. Please contact me with any dialysis placement concerns.  Amy Wu Dialysis Coordinator [phone]

## 2021-05-24 NOTE — Plan of Care (Signed)
Discussed with Dr. Hal Hope, pt was mild confusion, neck pain but no focal deficit. Etiology could be stroke, seizure, encephalopathy. Agree with CT and CT C-spine  as well as MRI for further evaluation. Also recommend EEG to rule out seizure. If CT, MRI or EEG has any concern, please call us back. If they are unremarkable, recommend further encephalopathy work up. Avoid low BP.   Rosalin Hawking, MD PhD Stroke Neurology 05/24/2021 3:21 AM

## 2021-05-24 NOTE — Progress Notes (Signed)
Agree with am assessment by Marcie Bal, RN.No significant changes noted throughout the day.   Patient off unit at this time for procedure.  Will report off to oncoming nurse.

## 2021-05-24 NOTE — Plan of Care (Signed)
Patient is alert and oriented x 4. Came into the hospital with excessive bleeding from her femoral graft. Patient denies pain as of yet. Patient presented with moisture-associated skin injury to her sacrum and inner thigh bilaterally. Will continue to monitor for any changes in condition.

## 2021-05-24 NOTE — Progress Notes (Signed)
Eeg done 

## 2021-05-24 NOTE — TOC Initial Note (Signed)
Transition of Care [facility]) - Initial/Assessment Note    Patient Details  Name: Amy Wu MRN: [id number] Date of Birth: [birth year]  Transition of Care [facility]) CM/SW Contact:    Beverly Sessions, RN Phone Number: 05/24/2021, 3:37 PM  Clinical Narrative:                 Patient admitted for bleeding HD site Patient off the floor for MRI Spoke with patient's significant other Legrand Como. Patient lives at home with significant other, and he is the primary care giver.   Patient uses [facility] for transport for HD Patient states that patient has a WC and lift in the home  Plan is follow by [facility] goes to [facility] 2 days a week.  Per Legrand Como plan for patient to return home with [facility] services and he will transport     Expected Discharge Plan: Home/Self Care ([facility]) Barriers to Discharge: Continued Medical Work up   Patient Goals and CMS Choice        Expected Discharge Plan and Services Expected Discharge Plan: Home/Self Care ([facility])   Discharge Planning Services: CM Consult                                          Prior Living Arrangements/Services   Lives with:: Significant Other Patient language and need for interpreter reviewed:: Yes Do you feel safe going back to the place where you live?: Yes      Need for Family Participation in Patient Care: Yes (Comment) Care giver support system in place?: Yes (comment) Current home services: DME Criminal Activity/Legal Involvement Pertinent to Current Situation/Hospitalization: No - Comment as needed  Activities of Daily Living Home Assistive Devices/Equipment: Wheelchair ADL Screening (condition at time of admission) Patient's cognitive ability adequate to safely complete daily activities?: Yes Is the patient deaf or have difficulty hearing?: No Does the patient have difficulty seeing, even when wearing glasses/contacts?: No Does the patient have difficulty concentrating, remembering, or making decisions?:  No Patient able to express need for assistance with ADLs?: Yes Does the patient have difficulty dressing or bathing?: Yes Independently performs ADLs?: Yes (appropriate for developmental age) Does the patient have difficulty walking or climbing stairs?: Yes Weakness of Legs: Both Weakness of Arms/Hands: None  Permission Sought/Granted                  Emotional Assessment       Orientation: : Oriented to Self, Oriented to Place, Oriented to  Time, Oriented to Situation Alcohol / Substance Use: Not Applicable Psych Involvement: No (comment)  Admission diagnosis:  SOB (shortness of breath) [R06.02] ESRD on dialysis ([place]) [N18.6, Z99.2] ESRD on hemodialysis ([place]) [N18.6, Z99.2] Bleeding from dialysis shunt, initial encounter [facility]) [M19.622W] Patient Active Problem List   Diagnosis Date Noted   ESRD on dialysis ([place]) 05/23/2021   Encounter for screening for COVID-19 03/11/2020   Restless leg syndrome, uncontrolled 06/20/2016   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[place]) 03/05/2014   Mild protein-calorie malnutrition ([place]) 09/22/2013   Hyperlipidemia, unspecified 03/30/2011   Dependence on renal dialysis ([place]) 03/30/2011   Other obesity due to excess calories 03/30/2011   Old myocardial infarction 08/09/2010   Other hereditary and idiopathic neuropathies 08/09/2010   Secondary hyperparathyroidism of renal origin ([place]) 04/13/2003   Hypertensive chronic kidney disease with stage 5 chronic kidney disease or end stage renal disease ([place]) 03/08/2002  Pure hypercholesterolemia 03/08/2002   Type 2 diabetes mellitus ([place]) 03/04/2002   End stage renal disease ([place]) 03/04/2002   PCP:  System, Provider Not In Pharmacy:   [facility] [place] [address] Phone: [phone] Fax: [phone]     Social Determinants of Health (SDOH) Interventions    Readmission Risk Interventions Readmission Risk  Prevention Plan 05/24/2021  Transportation Screening Complete  PCP or Specialist Appt within 3-5 Days Complete  HRI or Home Care Consult Complete  Palliative Care Screening Not Applicable  Medication Review (RN Care Manager) Complete  Some recent data might be hidden

## 2021-05-24 NOTE — Progress Notes (Signed)
[facility]  ROUNDING NOTE   Subjective:   Patient seen sitting up in bed, receiving medications from nurse. Alert and oriented Currently n.p.o. for procedure Denies shortness of breath  Received dialysis yesterday, became confused during treatment causing treatment to be terminated early.   Objective:  Vital signs in last 24 hours:  Temp:  [97.9 F (36.6 C)-98.6 F (37 C)] 97.9 F (36.6 C) (12/06 0757) Pulse Rate:  [65-98] 74 (12/06 0757) Resp:  [12-22] 18 (12/06 0543) BP: (40-146)/(21-91) 114/43 (12/06 0757) SpO2:  [93 %-100 %] 99 % (12/06 0757) Weight:  [92.1 kg] 92.1 kg (12/05 1703)  Weight change: 0 kg Filed Weights   05/23/21 0342 05/23/21 1703  Weight: 92.1 kg 92.1 kg    Intake/Output: I/O last 3 completed shifts: In: 0  Out: -560    Intake/Output this shift:  Total I/O In: 10 [I.V.:10] Out: -   Physical Exam: General: NAD, sitting up in bed  Head: Normocephalic, atraumatic. Moist oral mucosal membranes  Eyes: Anicteric  Lungs:  Clear to auscultation, normal effort  Heart: Regular rate and rhythm  Abdomen:  Soft, nontender  Extremities: No peripheral edema.  Neurologic: Nonfocal, moving all four extremities  Skin: No lesions  Access: Left thigh aVF    Basic Metabolic Panel: Recent Labs  Lab 05/23/21 0343 05/24/21 0528  NA 138 135  K 4.2 3.8  CL 97* 97*  CO2 30 28  GLUCOSE 210* 210*  BUN 43* 36*  CREATININE 9.64* 8.05*  CALCIUM 9.0 8.8*    Liver Function Tests: No results for input(s): AST, ALT, ALKPHOS, BILITOT, PROT, ALBUMIN in the last 168 hours. No results for input(s): LIPASE, AMYLASE in the last 168 hours. No results for input(s): AMMONIA in the last 168 hours.  CBC: Recent Labs  Lab 05/23/21 0343 05/24/21 0528  WBC 8.7 8.0  NEUTROABS 5.1  --   HGB 11.9* 9.3*  HCT 36.6 27.8*  MCV 81.2 79.4*  PLT 226 198    Cardiac Enzymes: No results for input(s): CKTOTAL, CKMB, CKMBINDEX, TROPONINI in the last 168  hours.  BNP: Invalid input(s): POCBNP  CBG: Recent Labs  Lab 05/23/21 1153 05/23/21 1425 05/23/21 2139 05/23/21 2325 05/24/21 0753  GLUCAP 115* 154* 276* 265* 176*    Microbiology: Results for orders placed or performed during the hospital encounter of 05/23/21  Resp Panel by RT-PCR (Flu A&B, Covid) Nasopharyngeal Swab     Status: None   Collection Time: 05/23/21  5:01 AM   Specimen: Nasopharyngeal Swab; Nasopharyngeal(NP) swabs in vial transport medium  Result Value Ref Range Status   SARS Coronavirus 2 by RT PCR NEGATIVE NEGATIVE Final    Comment: (NOTE) SARS-CoV-2 target nucleic acids are NOT DETECTED.  The SARS-CoV-2 RNA is generally detectable in upper respiratory specimens during the acute phase of infection. The lowest concentration of SARS-CoV-2 viral copies this assay can detect is 138 copies/mL. A negative result does not preclude SARS-Cov-2 infection and should not be used as the sole basis for treatment or other patient management decisions. A negative result may occur with  improper specimen collection/handling, submission of specimen other than nasopharyngeal swab, presence of viral mutation(s) within the areas targeted by this assay, and inadequate number of viral copies(<138 copies/mL). A negative result must be combined with clinical observations, patient history, and epidemiological information. The expected result is Negative.  Fact Sheet for Patients:  EntrepreneurPulse.com.au  Fact Sheet for Healthcare Providers:  IncredibleEmployment.be  This test is no t yet approved or cleared  by the [place] and  has been authorized for detection and/or diagnosis of SARS-CoV-2 by FDA under an Emergency Use Authorization (EUA). This EUA will remain  in effect (meaning this test can be used) for the duration of the COVID-19 declaration under Section 564(b)(1) of the Act, 21 U.S.C.section 360bbb-3(b)(1), unless the  authorization is terminated  or revoked sooner.       Influenza A by PCR NEGATIVE NEGATIVE Final   Influenza B by PCR NEGATIVE NEGATIVE Final    Comment: (NOTE) The Xpert Xpress SARS-CoV-2/FLU/RSV plus assay is intended as an aid in the diagnosis of influenza from Nasopharyngeal swab specimens and should not be used as a sole basis for treatment. Nasal washings and aspirates are unacceptable for Xpert Xpress SARS-CoV-2/FLU/RSV testing.  Fact Sheet for Patients: EntrepreneurPulse.com.au  Fact Sheet for Healthcare Providers: IncredibleEmployment.be  This test is not yet approved or cleared by the [place] FDA and has been authorized for detection and/or diagnosis of SARS-CoV-2 by FDA under an Emergency Use Authorization (EUA). This EUA will remain in effect (meaning this test can be used) for the duration of the COVID-19 declaration under Section 564(b)(1) of the Act, 21 U.S.C. section 360bbb-3(b)(1), unless the authorization is terminated or revoked.  Performed at [facility], [address] [place],  [address]     Coagulation Studies: Recent Labs    05/23/21 0343  LABPROT 13.7  INR 1.1    Urinalysis: No results for input(s): COLORURINE, LABSPEC, PHURINE, GLUCOSEU, HGBUR, BILIRUBINUR, KETONESUR, PROTEINUR, UROBILINOGEN, NITRITE, LEUKOCYTESUR in the last 72 hours.  Invalid input(s): APPERANCEUR    Imaging: CT HEAD WO CONTRAST (5MM)  Result Date: 05/23/2021 CLINICAL DATA:  Neck pain.  Altered mental status. EXAM: CT HEAD WITHOUT CONTRAST CT CERVICAL SPINE WITHOUT CONTRAST TECHNIQUE: Multidetector CT imaging of the head and cervical spine was performed following the standard protocol without intravenous contrast. Multiplanar CT image reconstructions of the cervical spine were also generated. COMPARISON:  None. FINDINGS: CT HEAD FINDINGS Brain: Mild age-related atrophy and chronic microvascular ischemic changes.  There is no acute intracranial hemorrhage. No mass effect or midline shift. No extra-axial fluid collection. Vascular: No hyperdense vessel or unexpected calcification. Skull: Normal. Negative for fracture or focal lesion. Sinuses/Orbits: The visualized paranasal sinuses and mastoid air cells are clear. Right scleral banding. Other: None CT CERVICAL SPINE FINDINGS Alignment: No acute subluxation. Skull base and vertebrae: No acute fracture. Soft tissues and spinal canal: No prevertebral fluid or swelling. No visible canal hematoma. Disc levels: Multiple degenerative changes and multilevel facet arthropathy. There is erosive changes of the atlantoaxial space. Upper chest: Negative. Other: Bilateral carotid bulb calcified plaques and calcification the vertebral arteries at the foramen magnum. IMPRESSION: 1. No acute intracranial pathology. Mild age-related atrophy and chronic microvascular ischemic changes. 2. No acute/traumatic cervical spine pathology. Multilevel degenerative changes. Electronically Signed   By: Anner Crete M.D.   On: 05/23/2021 22:52   CT CERVICAL SPINE WO CONTRAST  Result Date: 05/23/2021 CLINICAL DATA:  Neck pain.  Altered mental status. EXAM: CT HEAD WITHOUT CONTRAST CT CERVICAL SPINE WITHOUT CONTRAST TECHNIQUE: Multidetector CT imaging of the head and cervical spine was performed following the standard protocol without intravenous contrast. Multiplanar CT image reconstructions of the cervical spine were also generated. COMPARISON:  None. FINDINGS: CT HEAD FINDINGS Brain: Mild age-related atrophy and chronic microvascular ischemic changes. There is no acute intracranial hemorrhage. No mass effect or midline shift. No extra-axial fluid collection. Vascular: No hyperdense vessel or unexpected calcification. Skull:  Normal. Negative for fracture or focal lesion. Sinuses/Orbits: The visualized paranasal sinuses and mastoid air cells are clear. Right scleral banding. Other: None CT CERVICAL  SPINE FINDINGS Alignment: No acute subluxation. Skull base and vertebrae: No acute fracture. Soft tissues and spinal canal: No prevertebral fluid or swelling. No visible canal hematoma. Disc levels: Multiple degenerative changes and multilevel facet arthropathy. There is erosive changes of the atlantoaxial space. Upper chest: Negative. Other: Bilateral carotid bulb calcified plaques and calcification the vertebral arteries at the foramen magnum. IMPRESSION: 1. No acute intracranial pathology. Mild age-related atrophy and chronic microvascular ischemic changes. 2. No acute/traumatic cervical spine pathology. Multilevel degenerative changes. Electronically Signed   By: Anner Crete M.D.   On: 05/23/2021 22:52   DG Chest Port 1 View  Result Date: 05/23/2021 CLINICAL DATA:  Palpitation.  Shortness of breath. EXAM: PORTABLE CHEST 1 VIEW COMPARISON:  Chest radiograph dated 07/30/2020. FINDINGS: Minimal bibasilar atelectasis. No focal consolidation, pleural effusion, or pneumothorax. The cardiac silhouette is within limits. No acute osseous pathology. Degenerative changes of the spine and shoulders. IMPRESSION: No active disease. Electronically Signed   By: Anner Crete M.D.   On: 05/23/2021 22:19   CT RENAL STONE STUDY  Result Date: 05/24/2021 CLINICAL DATA:  Mid abdominal pain. EXAM: CT ABDOMEN AND PELVIS WITHOUT CONTRAST TECHNIQUE: Multidetector CT imaging of the abdomen and pelvis was performed following the standard protocol without IV contrast. COMPARISON:  None. FINDINGS: Lower chest: Mild atelectasis is seen within the left lung base. Marked severity coronary artery calcification is noted. Hepatobiliary: No focal liver abnormality is seen. Numerous gallstones are seen within the gallbladder lumen. There is no evidence of gallbladder wall thickening or biliary dilatation. A trace amount of pericholecystic inflammation is noted. Pancreas: Unremarkable. No pancreatic ductal dilatation or surrounding  inflammatory changes. Spleen: Normal in size without focal abnormality. Adrenals/Urinary Tract: Adrenal glands are unremarkable. Small native kidneys are seen with multiple bilateral simple renal cysts noted. The largest measures approximately 2.9 cm in diameter and is seen within the anteromedial aspect of the mid to upper right kidney. 4 mm and 6 mm calcifications are seen within the expected region of the left UPJ (best seen on coronal reformatted image 84, CT series 5). A similar appearing 1.4 cm calcification is noted along the expected region of the right UPJ (coronal reformatted image 80, CT series 5). The urinary bladder is empty and subsequently limited in evaluation. Stomach/Bowel: There is a small hiatal hernia. Appendix appears normal. No evidence of bowel wall thickening, distention, or inflammatory changes. Vascular/Lymphatic: Aortic atherosclerosis with marked severity calcification of the arterial structures of abdomen and pelvis. Bilateral common iliac artery stents are seen. With additional postoperative grafts noted within the left inguinal region. No enlarged abdominal or pelvic lymph nodes. Reproductive: Uterus and bilateral adnexa are unremarkable. Other: Surgical mesh is noted along the anterior at aspect of the lower abdominal wall. A catheter is seen looped within the right lower quadrant. No abdominopelvic ascites. Musculoskeletal: Multilevel degenerative changes seen throughout the lumbar spine. IMPRESSION: 1. Cholelithiasis with a trace amount of pericholecystic inflammatory fat stranding. Further evaluation with a right upper quadrant ultrasound or nuclear medicine hepatobiliary scan is recommended, as early acute cholecystitis cannot be excluded. 2. Small native kidneys with multiple bilateral simple renal cysts. 3. Findings suspicious for bilateral proximal ureteral calculi, as described above. 4. Small hiatal hernia. 5. Catheter within the lower abdomen which may represent a fractured  peritoneal dialysis catheter. 6. Aortic atherosclerosis. Aortic Atherosclerosis (ICD10-I70.0). Electronically Signed  By: Virgina Norfolk M.D.   On: 05/24/2021 01:33   US Abdomen Limited RUQ (LIVER/GB)  Result Date: 05/24/2021 CLINICAL DATA:  Cholelithiasis with concern for acute cholecystitis. EXAM: ULTRASOUND ABDOMEN LIMITED RIGHT UPPER QUADRANT COMPARISON:  Abdominal CT from earlier the same day FINDINGS: Gallbladder: Layering gallstones. No gallbladder wall thickening or focal tenderness. Common bile duct: Diameter: 4 mm Liver: No focal lesion identified. Within normal limits in parenchymal echogenicity. Portal vein is patent on color Doppler imaging with normal direction of blood flow towards the liver. IMPRESSION: Cholelithiasis.  No evidence of acute cholecystitis. Electronically Signed   By: Jorje Guild M.D.   On: 05/24/2021 05:09     Medications:    sodium chloride     sodium chloride     sodium chloride      atorvastatin  40 mg Oral Daily   calcitRIOL  0.75 mcg Oral Q M,W,F-HD   Chlorhexidine Gluconate Cloth  6 each Topical Q0600   cholecalciferol  2,000 Units Oral Daily   cinacalcet  30 mg Oral Q supper   ferrous sulfate  325 mg Oral QODAY   gabapentin  100 mg Oral Q M,W,F-1800   insulin aspart  0-9 Units Subcutaneous TID PC & HS   ketotifen  1 drop Both Eyes BID   lanthanum  1,000 mg Oral BID WC   linagliptin  5 mg Oral Daily   midodrine  5 mg Oral TID WC   prochlorperazine  5 mg Intravenous Once   sodium chloride flush  3 mL Intravenous Q12H   sodium chloride, sodium chloride, sodium chloride, acetaminophen **OR** acetaminophen, alteplase, benzonatate, camphor-menthol, diphenhydrAMINE, fluticasone, glucose, heparin, hydrocerin, lidocaine (PF), lidocaine-prilocaine, loperamide, loratadine, magnesium hydroxide, ondansetron **OR** ondansetron (ZOFRAN) IV, pentafluoroprop-tetrafluoroeth, polyethylene glycol, rOPINIRole, senna-docusate, simethicone, sodium chloride flush,  vitamin A & D  Assessment/ Plan:  Amy Wu is a 70 y.o.  female  with past medical history of hypertension, diabetes, PVD, dyslipidemia, and end-stage renal disease on dialysis, who was admitted to [facility] on 05/23/2021 for SOB (shortness of breath) [R06.02] ESRD on dialysis ([place]) [N18.6, Z99.2] ESRD on hemodialysis ([place]) [N18.6, Z99.2] Bleeding from dialysis shunt, initial encounter ([place]) [T82.838A]  [facility] [place]/MWF/ Lt thigh AVF  End-stage renal disease with bleeding access on hemodialysis.  Will maintain outpatient schedule, if possible.  Patient received dialysis yesterday.  Per HD RN patient became extremely confused.  UF turned off and normal saline bolus given.  Confusion persisted.  Patient treatment terminated 1.5 hours from start.  Patient alert and oriented this morning.  Scheduled for angiogram today.  If remains inpatient we will complete dialysis treatment tomorrow.   2. Anemia of chronic kidney disease   Hemoglobin remains at goal of 9.3.   3. Secondary Hyperparathyroidism: We will continue to monitor bone mineral metabolism during this admission.   4. Diabetes mellitus type II with chronic kidney disease insulin dependent. Home regimen includes Lantus and Januvia. Glucose stable    LOS: 1 Tristen Pennino 12/6/202210:51 AM

## 2021-05-24 NOTE — Op Note (Signed)
OPERATIVE NOTE   PROCEDURE: Contrast injection left thigh AV loop graft. Percutaneous transluminal angioplasty peripheral segment to 8 mm Percutaneous transluminal angioplasty central venous segment to 10 mm  PRE-OPERATIVE DIAGNOSIS: Complication of dialysis access                                                       End Stage Renal Disease  POST-OPERATIVE DIAGNOSIS: same as above   SURGEON: Katha Cabal, M.D.  ANESTHESIA: Conscious sedation was administered under my direct supervision by the interventional radiology RN. IV Versed plus fentanyl were utilized. Continuous ECG, pulse oximetry and blood pressure was monitored throughout the entire procedure.  Conscious sedation was for a total of 28 minutes.  ESTIMATED BLOOD LOSS: minimal  FINDING(S): Stricture of the AV graft within the peripheral segment as well as a stricture within the central venous portion  SPECIMEN(S):  None  CONTRAST: 15 cc  FLUOROSCOPY TIME: 1.6 minutes  INDICATIONS: Amy Wu is a 69 y.o. female who  presents with malfunctioning left thigh AV access.  The patient is scheduled for angiography with possible intervention of the AV access.  The patient is aware the risks include but are not limited to: bleeding, infection, thrombosis of the cannulated access, and possible anaphylactic reaction to the contrast.  The patient acknowledges if the access can not be salvaged a tunneled catheter will be needed and will be placed during this procedure.  The patient is aware of the risks of the procedure and elects to proceed with the angiogram and intervention.  DESCRIPTION: After full informed written consent was obtained, the patient was brought back to the Special Procedure suite and placed supine position.  Appropriate cardiopulmonary monitors were placed.  The left thigh was prepped and draped in the standard fashion.  Appropriate timeout is called. The left thigh AV loop graft was cannulated with a  micropuncture needle.  Cannulation was performed with ultrasound guidance. Ultrasound was placed in a sterile sleeve, the AV access was interrogated and noted to be echolucent and compressible indicating patency. Image was recorded for the permanent record. The puncture is performed under continuous ultrasound visualization.   The microwire was advanced and the needle was exchanged for  a microsheath.  The J-wire was then advanced and a 6 Fr sheath inserted.  Hand injections were completed to image the access from the arterial anastomosis through the entire access.  The central venous structures were also imaged by hand injections.  Based on the images, the entire graft has been stented in some areas it appears to have been stented twice.  Overall the graft is free of hemodynamically significant strictures.  At the venous outflow, venous anastomosis there is a greater than 90% focal stenosis that extends over a distance of approximately 8 to 10 mm.  In the distal external iliac vein there is another greater than 75% stenosis which extends over a distance of approximately 10 to 15 mm.  Proximal to this lesion the central venous system appears widely patent.   3000 units of heparin was given and a wire was negotiated through the strictures within the venous portion of the graft as well as the central stenosis. The sheath was then upsized to a 7 [place] sheath.  An 6 mm x 60 mm Lutonix drug-eluting balloon was used to treat the peripheral  lesion.  Inflation was to 12 atm 1 minute.  Next an 8 mm x 60 mm [place] balloon is advanced across this same lesion inflation is to 16 atm for 1 minute.  Follow-up imaging now demonstrates less than 15% residual stenosis at the venous outflow.  The detector was then repositioned over the central portion of the AV access and 10 mm x 40 mm Dor[place] balloon was used to treat the stricture within the iliac vein. Inflation was to 12 atm for 1 minutes.  Follow-up imaging demonstrates  less than 50% residual stenosis within the external iliac vein.  There is now rapid flow of contrast through the graft and the central veins.   A 4-0 Monocryl purse-string suture was sewn around the sheath.  The sheath was removed and light pressure was applied.  A sterile bandage was applied to the puncture site.    COMPLICATIONS: None  CONDITION: Amy Wu, M.D [facility] Office: [phone]  05/24/2021 6:41 PM

## 2021-05-24 NOTE — Progress Notes (Signed)
[facility] [facility] [place] NAME: Amy Wu    MR#:  [id number]  DATE OF BIRTH:  August 04, 1950  SUBJECTIVE:  patient NPO for procedure. Supposed to go for left lower extremity AV fistula grant by Dr. Delana Meyer. Denies any complaints. Blood pressure much improved. REVIEW OF SYSTEMS:   Review of Systems  Constitutional:  Negative for chills, fever and weight loss.  HENT:  Negative for ear discharge, ear pain and nosebleeds.   Eyes:  Negative for blurred vision, pain and discharge.  Respiratory:  Negative for sputum production, shortness of breath, wheezing and stridor.   Cardiovascular:  Negative for chest pain, palpitations, orthopnea and PND.  Gastrointestinal:  Negative for abdominal pain, diarrhea, nausea and vomiting.  Genitourinary:  Negative for frequency and urgency.  Musculoskeletal:  Negative for back pain and joint pain.  Neurological:  Positive for weakness. Negative for sensory change, speech change and focal weakness.  Psychiatric/Behavioral:  Negative for depression and hallucinations. The patient is not nervous/anxious.   Tolerating Diet: Tolerating PT:   DRUG ALLERGIES:   Allergies  Allergen Reactions   Ancef [Cefazolin] Palpitations   Contrast Media [Iodinated Diagnostic Agents] Palpitations    VITALS:  Blood pressure (!) 106/40, pulse 68, temperature 97.7 F (36.5 C), temperature source Oral, resp. rate 18, weight 92.1 kg, SpO2 93 %.  PHYSICAL EXAMINATION:   Physical Exam  GENERAL:  70 y.o.-year-old patient lying in the bed with no acute distress. The HEENT: Head atraumatic, normocephalic. Oropharynx and nasopharynx clear.  LUNGS: Normal breath sounds bilaterally, no wheezing, rales, rhonchi. No use of accessory muscles of respiration.  CARDIOVASCULAR: S1, S2 normal. No murmurs, rubs, or gallops.  ABDOMEN: Soft, nontender, nondistended. Bowel sounds present. No organomegaly or mass.  EXTREMITIES:    No bleeding at left AV  fistula site NEUROLOGIC: nonfocal PSYCHIATRIC:  patient is alert and oriented x 3.  SKIN: No obvious rash, lesion, or ulcer.   LABORATORY PANEL:  CBC Recent Labs  Lab 05/24/21 0528  WBC 8.0  HGB 9.3*  HCT 27.8*  PLT 198     Chemistries  Recent Labs  Lab 05/24/21 0528  NA 135  K 3.8  CL 97*  CO2 28  GLUCOSE 210*  BUN 36*  CREATININE 8.05*  CALCIUM 8.8*    Cardiac Enzymes No results for input(s): TROPONINI in the last 168 hours. RADIOLOGY:  CT HEAD WO CONTRAST (5MM)  Result Date: 05/23/2021 CLINICAL DATA:  Neck pain.  Altered mental status. EXAM: CT HEAD WITHOUT CONTRAST CT CERVICAL SPINE WITHOUT CONTRAST TECHNIQUE: Multidetector CT imaging of the head and cervical spine was performed following the standard protocol without intravenous contrast. Multiplanar CT image reconstructions of the cervical spine were also generated. COMPARISON:  None. FINDINGS: CT HEAD FINDINGS Brain: Mild age-related atrophy and chronic microvascular ischemic changes. There is no acute intracranial hemorrhage. No mass effect or midline shift. No extra-axial fluid collection. Vascular: No hyperdense vessel or unexpected calcification. Skull: Normal. Negative for fracture or focal lesion. Sinuses/Orbits: The visualized paranasal sinuses and mastoid air cells are clear. Right scleral banding. Other: None CT CERVICAL SPINE FINDINGS Alignment: No acute subluxation. Skull base and vertebrae: No acute fracture. Soft tissues and spinal canal: No prevertebral fluid or swelling. No visible canal hematoma. Disc levels: Multiple degenerative changes and multilevel facet arthropathy. There is erosive changes of the atlantoaxial space. Upper chest: Negative. Other: Bilateral carotid bulb calcified plaques and calcification the vertebral arteries at the foramen magnum. IMPRESSION: 1. No acute  intracranial pathology. Mild age-related atrophy and chronic microvascular ischemic changes. 2. No acute/traumatic cervical spine  pathology. Multilevel degenerative changes. Electronically Signed   By: Anner Crete M.D.   On: 05/23/2021 22:52   CT CERVICAL SPINE WO CONTRAST  Result Date: 05/23/2021 CLINICAL DATA:  Neck pain.  Altered mental status. EXAM: CT HEAD WITHOUT CONTRAST CT CERVICAL SPINE WITHOUT CONTRAST TECHNIQUE: Multidetector CT imaging of the head and cervical spine was performed following the standard protocol without intravenous contrast. Multiplanar CT image reconstructions of the cervical spine were also generated. COMPARISON:  None. FINDINGS: CT HEAD FINDINGS Brain: Mild age-related atrophy and chronic microvascular ischemic changes. There is no acute intracranial hemorrhage. No mass effect or midline shift. No extra-axial fluid collection. Vascular: No hyperdense vessel or unexpected calcification. Skull: Normal. Negative for fracture or focal lesion. Sinuses/Orbits: The visualized paranasal sinuses and mastoid air cells are clear. Right scleral banding. Other: None CT CERVICAL SPINE FINDINGS Alignment: No acute subluxation. Skull base and vertebrae: No acute fracture. Soft tissues and spinal canal: No prevertebral fluid or swelling. No visible canal hematoma. Disc levels: Multiple degenerative changes and multilevel facet arthropathy. There is erosive changes of the atlantoaxial space. Upper chest: Negative. Other: Bilateral carotid bulb calcified plaques and calcification the vertebral arteries at the foramen magnum. IMPRESSION: 1. No acute intracranial pathology. Mild age-related atrophy and chronic microvascular ischemic changes. 2. No acute/traumatic cervical spine pathology. Multilevel degenerative changes. Electronically Signed   By: Anner Crete M.D.   On: 05/23/2021 22:52   DG Chest Port 1 View  Result Date: 05/23/2021 CLINICAL DATA:  Palpitation.  Shortness of breath. EXAM: PORTABLE CHEST 1 VIEW COMPARISON:  Chest radiograph dated 07/30/2020. FINDINGS: Minimal bibasilar atelectasis. No focal  consolidation, pleural effusion, or pneumothorax. The cardiac silhouette is within limits. No acute osseous pathology. Degenerative changes of the spine and shoulders. IMPRESSION: No active disease. Electronically Signed   By: Anner Crete M.D.   On: 05/23/2021 22:19   CT RENAL STONE STUDY  Result Date: 05/24/2021 CLINICAL DATA:  Mid abdominal pain. EXAM: CT ABDOMEN AND PELVIS WITHOUT CONTRAST TECHNIQUE: Multidetector CT imaging of the abdomen and pelvis was performed following the standard protocol without IV contrast. COMPARISON:  None. FINDINGS: Lower chest: Mild atelectasis is seen within the left lung base. Marked severity coronary artery calcification is noted. Hepatobiliary: No focal liver abnormality is seen. Numerous gallstones are seen within the gallbladder lumen. There is no evidence of gallbladder wall thickening or biliary dilatation. A trace amount of pericholecystic inflammation is noted. Pancreas: Unremarkable. No pancreatic ductal dilatation or surrounding inflammatory changes. Spleen: Normal in size without focal abnormality. Adrenals/Urinary Tract: Adrenal glands are unremarkable. Small native kidneys are seen with multiple bilateral simple renal cysts noted. The largest measures approximately 2.9 cm in diameter and is seen within the anteromedial aspect of the mid to upper right kidney. 4 mm and 6 mm calcifications are seen within the expected region of the left UPJ (best seen on coronal reformatted image 84, CT series 5). A similar appearing 1.4 cm calcification is noted along the expected region of the right UPJ (coronal reformatted image 80, CT series 5). The urinary bladder is empty and subsequently limited in evaluation. Stomach/Bowel: There is a small hiatal hernia. Appendix appears normal. No evidence of bowel wall thickening, distention, or inflammatory changes. Vascular/Lymphatic: Aortic atherosclerosis with marked severity calcification of the arterial structures of abdomen and  pelvis. Bilateral common iliac artery stents are seen. With additional postoperative grafts noted within the left inguinal  region. No enlarged abdominal or pelvic lymph nodes. Reproductive: Uterus and bilateral adnexa are unremarkable. Other: Surgical mesh is noted along the anterior at aspect of the lower abdominal wall. A catheter is seen looped within the right lower quadrant. No abdominopelvic ascites. Musculoskeletal: Multilevel degenerative changes seen throughout the lumbar spine. IMPRESSION: 1. Cholelithiasis with a trace amount of pericholecystic inflammatory fat stranding. Further evaluation with a right upper quadrant ultrasound or nuclear medicine hepatobiliary scan is recommended, as early acute cholecystitis cannot be excluded. 2. Small native kidneys with multiple bilateral simple renal cysts. 3. Findings suspicious for bilateral proximal ureteral calculi, as described above. 4. Small hiatal hernia. 5. Catheter within the lower abdomen which may represent a fractured peritoneal dialysis catheter. 6. Aortic atherosclerosis. Aortic Atherosclerosis (ICD10-I70.0). Electronically Signed   By: Virgina Norfolk M.D.   On: 05/24/2021 01:33   US Abdomen Limited RUQ (LIVER/GB)  Result Date: 05/24/2021 CLINICAL DATA:  Cholelithiasis with concern for acute cholecystitis. EXAM: ULTRASOUND ABDOMEN LIMITED RIGHT UPPER QUADRANT COMPARISON:  Abdominal CT from earlier the same day FINDINGS: Gallbladder: Layering gallstones. No gallbladder wall thickening or focal tenderness. Common bile duct: Diameter: 4 mm Liver: No focal lesion identified. Within normal limits in parenchymal echogenicity. Portal vein is patent on color Doppler imaging with normal direction of blood flow towards the liver. IMPRESSION: Cholelithiasis.  No evidence of acute cholecystitis. Electronically Signed   By: Jorje Guild M.D.   On: 05/24/2021 05:09   ASSESSMENT AND PLAN:  Amy Wu is a 70 y.o. African-American female with medical  history significant for end-stage renal disease on hemodialysis Monday Wednesday Friday, hypertension, dyslipidemia, type diabetes mellitus and peripheral vascular disease, who presented to the ER for acute onset of bleeding from her left thigh dialysis AV fistula.  When she woke up she noted that it was spurting blood.  Left femoral AV graft bleeding -- hemoglobin stable 11.1 -- no active bleeding -- seen by vascular surgery Dr. Delana Meyer-- plans to do fistulogram tomorrow --12/6-- left lower extremity fistulogram  end-stage renal disease on hemodialysis -- Dr. 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  >50% time spent on counselling and coordination of care  Note: This dictation was prepared with Dragon dictation along with smaller phrase technology. Any transcriptional errors that result from this process are unintentional.  Fritzi Mandes M.D    [facility]   CC: Primary care physician; System, Provider Not In Patient ID: RAIDYN BREINER, female   DOB: [birth year], 70 y.o.   MRN: [id number]

## 2021-05-25 ENCOUNTER — Other Ambulatory Visit (INDEPENDENT_AMBULATORY_CARE_PROVIDER_SITE_OTHER): Payer: Self-pay | Admitting: Vascular Surgery

## 2021-05-25 ENCOUNTER — Other Ambulatory Visit: Payer: Self-pay

## 2021-05-25 ENCOUNTER — Encounter: Payer: Self-pay | Admitting: Vascular Surgery

## 2021-05-25 LAB — GLUCOSE, CAPILLARY
Glucose-Capillary: 369 mg/dL — ABNORMAL HIGH (ref 70–99)
Glucose-Capillary: 304 mg/dL — ABNORMAL HIGH (ref 70–99)
Glucose-Capillary: 193 mg/dL — ABNORMAL HIGH (ref 70–99)

## 2021-05-25 LAB — HEPATITIS B SURFACE ANTIBODY, QUANTITATIVE: Hep B S AB Quant (Post): 55.4 m[IU]/mL (ref >9.9)

## 2021-05-25 MED ORDER — INSULIN GLARGINE-YFGN 100 UNIT/ML ~~LOC~~ SOLN
7.0000 [IU] | Freq: Every day | SUBCUTANEOUS | Status: DC
  Administered 2021-05-25: 7 [IU] via SUBCUTANEOUS
  Filled 2021-05-25 (×2): qty 0.07

## 2021-05-25 NOTE — Procedures (Signed)
Routine EEG Report  Amy Wu is a 70 y.o. female with a history of encephalopathy who is undergoing an EEG to evaluate for seizures.  Report: This EEG was acquired with electrodes placed according to the International 10-20 electrode system (including Fp1, Fp2, F3, F4, C3, C4, P3, P4, O1, O2, T3, T4, T5, T6, A1, A2, Fz, Cz, Pz). The following electrodes were missing or displaced: none.  The occipital dominant rhythm was 7-8 Hz. This activity is reactive to stimulation. Drowsiness was manifested by background fragmentation; deeper stages of sleep were identified by K complexes and sleep spindles. There was no focal slowing. There were no interictal epileptiform discharges. There were no electrographic seizures identified. There was no abnormal response to photic stimulation or hyperventilation.   Impression and clinical correlation: This EEG was obtained while awake and asleep and is abnormal due to mild diffuse slowing indicative of global cerebral dysfunction. Epileptiform abnormalities were not seen during this recording.  Su Monks, MD [facility] [phone]  If 7pm- 7am, please page neurology on call as listed in [place].

## 2021-05-25 NOTE — Progress Notes (Signed)
Patient scheduled for 3 hr session, via left AVG, difficulty cannulating, high AP throughout session, resulting in decreased BFR. Due to decreased BFR, poorly functioning AVG,  dialyzer clotted, treatment terminated, and  UF not met. Pt voices no concerns, states an understanding of issue. Nephrology team in suite, plan for patient to see vascular for access placement of CVC. Patient return to room and report given to primary nurse.

## 2021-05-25 NOTE — Progress Notes (Signed)
[facility]  ROUNDING NOTE   Subjective:   Patient seen and evaluated during dialysis   HEMODIALYSIS FLOWSHEET:  Blood Flow Rate (mL/min): 350 mL/min Arterial Pressure (mmHg): -230 mmHg Venous Pressure (mmHg): 130 mmHg Transmembrane Pressure (mmHg): 60 mmHg Ultrafiltration Rate (mL/min): 670 mL/min Dialysate Flow Rate (mL/min): 500 ml/min Conductivity: Machine : 13.6 Conductivity: Machine : 13.6 Dialysis Fluid Bolus: Normal Saline Bolus Amount (mL): 250 mL  No complaints at this time Tolerated fistulogram well yesterday.  Was told by Vascular surgery that this access will not last much longer.    Objective:  Vital signs in last 24 hours:  Temp:  [97.7 F (36.5 C)-98.6 F (37 C)] 97.7 F (36.5 C) (12/07 0930) Pulse Rate:  [65-84] 72 (12/07 1130) Resp:  [13-25] 25 (12/07 0909) BP: (102-162)/(40-89) 102/49 (12/07 1130) SpO2:  [92 %-100 %] 98 % (12/07 0730) Weight:  [92.1 kg] 92.1 kg (12/07 0909)  Weight change:  Filed Weights   05/23/21 1703 05/25/21 0757 05/25/21 0909  Weight: 92.1 kg 92.1 kg 92.1 kg    Intake/Output: I/O last 3 completed shifts: In: 35.7 [I.V.:17.3; IV Piggyback:18.3] Out: -560    Intake/Output this shift:  No intake/output data recorded.  Physical Exam: General: NAD, resting in bed  Head: Normocephalic, atraumatic. Moist oral mucosal membranes  Eyes: Anicteric  Lungs:  Clear to auscultation, normal effort  Heart: Regular rate and rhythm  Abdomen:  Soft, nontender  Extremities: No peripheral edema.  Neurologic: Nonfocal, moving all four extremities  Skin: No lesions  Access: Left thigh aVF    Basic Metabolic Panel: Recent Labs  Lab 05/23/21 0343 05/24/21 0528  NA 138 135  K 4.2 3.8  CL 97* 97*  CO2 30 28  GLUCOSE 210* 210*  BUN 43* 36*  CREATININE 9.64* 8.05*  CALCIUM 9.0 8.8*  PHOS  --  2.5     Liver Function Tests: No results for input(s): AST, ALT, ALKPHOS, BILITOT, PROT, ALBUMIN in the last 168  hours. No results for input(s): LIPASE, AMYLASE in the last 168 hours. No results for input(s): AMMONIA in the last 168 hours.  CBC: Recent Labs  Lab 05/23/21 0343 05/24/21 0528  WBC 8.7 8.0  NEUTROABS 5.1  --   HGB 11.9* 9.3*  HCT 36.6 27.8*  MCV 81.2 79.4*  PLT 226 198     Cardiac Enzymes: No results for input(s): CKTOTAL, CKMB, CKMBINDEX, TROPONINI in the last 168 hours.  BNP: Invalid input(s): POCBNP  CBG: Recent Labs  Lab 05/24/21 1131 05/24/21 1323 05/24/21 1708 05/24/21 2119 05/25/21 0752  GLUCAP 139* 140* 119* 307* 369*     Microbiology: Results for orders placed or performed during the hospital encounter of 05/23/21  Resp Panel by RT-PCR (Flu A&B, Covid) Nasopharyngeal Swab     Status: None   Collection Time: 05/23/21  5:01 AM   Specimen: Nasopharyngeal Swab; Nasopharyngeal(NP) swabs in vial transport medium  Result Value Ref Range Status   SARS Coronavirus 2 by RT PCR NEGATIVE NEGATIVE Final    Comment: (NOTE) SARS-CoV-2 target nucleic acids are NOT DETECTED.  The SARS-CoV-2 RNA is generally detectable in upper respiratory specimens during the acute phase of infection. The lowest concentration of SARS-CoV-2 viral copies this assay can detect is 138 copies/mL. A negative result does not preclude SARS-Cov-2 infection and should not be used as the sole basis for treatment or other patient management decisions. A negative result may occur with  improper specimen collection/handling, submission of specimen other than nasopharyngeal swab, presence of  viral mutation(s) within the areas targeted by this assay, and inadequate number of viral copies(<138 copies/mL). A negative result must be combined with clinical observations, patient history, and epidemiological information. The expected result is Negative.  Fact Sheet for Patients:  EntrepreneurPulse.com.au  Fact Sheet for Healthcare Providers:   IncredibleEmployment.be  This test is no t yet approved or cleared by the [place] FDA and  has been authorized for detection and/or diagnosis of SARS-CoV-2 by FDA under an Emergency Use Authorization (EUA). This EUA will remain  in effect (meaning this test can be used) for the duration of the COVID-19 declaration under Section 564(b)(1) of the Act, 21 U.S.C.section 360bbb-3(b)(1), unless the authorization is terminated  or revoked sooner.       Influenza A by PCR NEGATIVE NEGATIVE Final   Influenza B by PCR NEGATIVE NEGATIVE Final    Comment: (NOTE) The Xpert Xpress SARS-CoV-2/FLU/RSV plus assay is intended as an aid in the diagnosis of influenza from Nasopharyngeal swab specimens and should not be used as a sole basis for treatment. Nasal washings and aspirates are unacceptable for Xpert Xpress SARS-CoV-2/FLU/RSV testing.  Fact Sheet for Patients: EntrepreneurPulse.com.au  Fact Sheet for Healthcare Providers: IncredibleEmployment.be  This test is not yet approved or cleared by the [place] FDA and has been authorized for detection and/or diagnosis of SARS-CoV-2 by FDA under an Emergency Use Authorization (EUA). This EUA will remain in effect (meaning this test can be used) for the duration of the COVID-19 declaration under Section 564(b)(1) of the Act, 21 U.S.C. section 360bbb-3(b)(1), unless the authorization is terminated or revoked.  Performed at [facility], [address] [place], [place] [address]     Coagulation Studies: Recent Labs    05/23/21 0343  LABPROT 13.7  INR 1.1     Urinalysis: No results for input(s): COLORURINE, LABSPEC, PHURINE, GLUCOSEU, HGBUR, BILIRUBINUR, KETONESUR, PROTEINUR, UROBILINOGEN, NITRITE, LEUKOCYTESUR in the last 72 hours.  Invalid input(s): APPERANCEUR    Imaging: CT HEAD WO CONTRAST (5MM)  Result Date: 05/23/2021 CLINICAL DATA:  Neck pain.   Altered mental status. EXAM: CT HEAD WITHOUT CONTRAST CT CERVICAL SPINE WITHOUT CONTRAST TECHNIQUE: Multidetector CT imaging of the head and cervical spine was performed following the standard protocol without intravenous contrast. Multiplanar CT image reconstructions of the cervical spine were also generated. COMPARISON:  None. FINDINGS: CT HEAD FINDINGS Brain: Mild age-related atrophy and chronic microvascular ischemic changes. There is no acute intracranial hemorrhage. No mass effect or midline shift. No extra-axial fluid collection. Vascular: No hyperdense vessel or unexpected calcification. Skull: Normal. Negative for fracture or focal lesion. Sinuses/Orbits: The visualized paranasal sinuses and mastoid air cells are clear. Right scleral banding. Other: None CT CERVICAL SPINE FINDINGS Alignment: No acute subluxation. Skull base and vertebrae: No acute fracture. Soft tissues and spinal canal: No prevertebral fluid or swelling. No visible canal hematoma. Disc levels: Multiple degenerative changes and multilevel facet arthropathy. There is erosive changes of the atlantoaxial space. Upper chest: Negative. Other: Bilateral carotid bulb calcified plaques and calcification the vertebral arteries at the foramen magnum. IMPRESSION: 1. No acute intracranial pathology. Mild age-related atrophy and chronic microvascular ischemic changes. 2. No acute/traumatic cervical spine pathology. Multilevel degenerative changes. Electronically Signed   By: Anner Crete M.D.   On: 05/23/2021 22:52   CT CERVICAL SPINE WO CONTRAST  Result Date: 05/23/2021 CLINICAL DATA:  Neck pain.  Altered mental status. EXAM: CT HEAD WITHOUT CONTRAST CT CERVICAL SPINE WITHOUT CONTRAST TECHNIQUE: Multidetector CT imaging of the head and cervical  spine was performed following the standard protocol without intravenous contrast. Multiplanar CT image reconstructions of the cervical spine were also generated. COMPARISON:  None. FINDINGS: CT HEAD  FINDINGS Brain: Mild age-related atrophy and chronic microvascular ischemic changes. There is no acute intracranial hemorrhage. No mass effect or midline shift. No extra-axial fluid collection. Vascular: No hyperdense vessel or unexpected calcification. Skull: Normal. Negative for fracture or focal lesion. Sinuses/Orbits: The visualized paranasal sinuses and mastoid air cells are clear. Right scleral banding. Other: None CT CERVICAL SPINE FINDINGS Alignment: No acute subluxation. Skull base and vertebrae: No acute fracture. Soft tissues and spinal canal: No prevertebral fluid or swelling. No visible canal hematoma. Disc levels: Multiple degenerative changes and multilevel facet arthropathy. There is erosive changes of the atlantoaxial space. Upper chest: Negative. Other: Bilateral carotid bulb calcified plaques and calcification the vertebral arteries at the foramen magnum. IMPRESSION: 1. No acute intracranial pathology. Mild age-related atrophy and chronic microvascular ischemic changes. 2. No acute/traumatic cervical spine pathology. Multilevel degenerative changes. Electronically Signed   By: Anner Crete M.D.   On: 05/23/2021 22:52   MR BRAIN WO CONTRAST  Result Date: 05/24/2021 CLINICAL DATA:  Mental status changes of unknown cause beginning yesterday. EXAM: MRI HEAD WITHOUT CONTRAST TECHNIQUE: Multiplanar, multiecho pulse sequences of the brain and surrounding structures were obtained without intravenous contrast. COMPARISON:  Head CT yesterday.  MRI 06/24/2010. FINDINGS: Brain: Diffusion imaging does not show any acute or subacute infarction. 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 No brainstem abnormality is seen. There are old small vessel infarctions of the basal ganglia and there are minor chronic small-vessel ischemic changes of the cerebral hemispheric white matter. There is a single old small left parietal cortical infarction. No mass lesion, hemorrhage, hydrocephalus or extra-axial  collection. Scattered punctate foci of hemosiderin deposition within the brain probably relate to chronic hypertension. Vascular: Major vessels at the base of the brain show flow. Skull and upper cervical spine: Negative Sinuses/Orbits: Clear sinuses. Previous scleral banding on the right. Other: None IMPRESSION: No acute or reversible finding. 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 Tiny old left parietal cortical infarction at the vertex. Punctate foci of hemosiderin deposition within the brain likely secondary to chronic hypertension. Electronically Signed   By: Nelson Chimes M.D.   On: 05/24/2021 16:05   PERIPHERAL VASCULAR CATHETERIZATION  Result Date: 05/24/2021 See surgical note for result.  DG Chest Port 1 View  Result Date: 05/23/2021 CLINICAL DATA:  Palpitation.  Shortness of breath. EXAM: PORTABLE CHEST 1 VIEW COMPARISON:  Chest radiograph dated 07/30/2020. FINDINGS: Minimal bibasilar atelectasis. No focal consolidation, pleural effusion, or pneumothorax. The cardiac silhouette is within limits. No acute osseous pathology. Degenerative changes of the spine and shoulders. IMPRESSION: No active disease. Electronically Signed   By: Anner Crete M.D.   On: 05/23/2021 22:19   EEG adult  Result Date: 05/25/2021 Derek Jack, MD     05/25/2021 10:42 AM Routine EEG Report JABRIA LOOS is a 70 y.o. female with a history of encephalopathy who is undergoing an EEG to evaluate for seizures. Report: This EEG was acquired with electrodes placed according to the International 10-20 electrode system (including Fp1, Fp2, F3, F4, C3, C4, P3, P4, O1, O2, T3, T4, T5, T6, A1, A2, Fz, Cz, Pz). The following electrodes were missing or displaced: none. The occipital dominant rhythm was 7-8 Hz. This activity is reactive to stimulation. Drowsiness was manifested by background fragmentation; deeper stages of sleep  were identified by K complexes and  sleep spindles. There was no focal slowing. There were no interictal epileptiform discharges. There were no electrographic seizures identified. There was no abnormal response to photic stimulation or hyperventilation. Impression and clinical correlation: This EEG was obtained while awake and asleep and is abnormal due to mild diffuse slowing indicative of global cerebral dysfunction. Epileptiform abnormalities were not seen during this recording. Su Monks, MD [facility] [phone] If 7pm- 7am, please page neurology on call as listed in [place].   CT RENAL STONE STUDY  Result Date: 05/24/2021 CLINICAL DATA:  Mid abdominal pain. EXAM: CT ABDOMEN AND PELVIS WITHOUT CONTRAST TECHNIQUE: Multidetector CT imaging of the abdomen and pelvis was performed following the standard protocol without IV contrast. COMPARISON:  None. FINDINGS: Lower chest: Mild atelectasis is seen within the left lung base. Marked severity coronary artery calcification is noted. Hepatobiliary: No focal liver abnormality is seen. Numerous gallstones are seen within the gallbladder lumen. There is no evidence of gallbladder wall thickening or biliary dilatation. A trace amount of pericholecystic inflammation is noted. Pancreas: Unremarkable. No pancreatic ductal dilatation or surrounding inflammatory changes. Spleen: Normal in size without focal abnormality. Adrenals/Urinary Tract: Adrenal glands are unremarkable. Small native kidneys are seen with multiple bilateral simple renal cysts noted. The largest measures approximately 2.9 cm in diameter and is seen within the anteromedial aspect of the mid to upper right kidney. 4 mm and 6 mm calcifications are seen within the expected region of the left UPJ (best seen on coronal reformatted image 84, CT series 5). A similar appearing 1.4 cm calcification is noted along the expected region of the right UPJ (coronal reformatted image 80, CT series 5). The urinary bladder is empty and  subsequently limited in evaluation. Stomach/Bowel: There is a small hiatal hernia. Appendix appears normal. No evidence of bowel wall thickening, distention, or inflammatory changes. Vascular/Lymphatic: Aortic atherosclerosis with marked severity calcification of the arterial structures of abdomen and pelvis. Bilateral common iliac artery stents are seen. With additional postoperative grafts noted within the left inguinal region. No enlarged abdominal or pelvic lymph nodes. Reproductive: Uterus and bilateral adnexa are unremarkable. Other: Surgical mesh is noted along the anterior at aspect of the lower abdominal wall. A catheter is seen looped within the right lower quadrant. No abdominopelvic ascites. Musculoskeletal: Multilevel degenerative changes seen throughout the lumbar spine. IMPRESSION: 1. Cholelithiasis with a trace amount of pericholecystic inflammatory fat stranding. Further evaluation with a right upper quadrant ultrasound or nuclear medicine hepatobiliary scan is recommended, as early acute cholecystitis cannot be excluded. 2. Small native kidneys with multiple bilateral simple renal cysts. 3. Findings suspicious for bilateral proximal ureteral calculi, as described above. 4. Small hiatal hernia. 5. Catheter within the lower abdomen which may represent a fractured peritoneal dialysis catheter. 6. Aortic atherosclerosis. Aortic Atherosclerosis (ICD10-I70.0). Electronically Signed   By: Virgina Norfolk M.D.   On: 05/24/2021 01:33   US Abdomen Limited RUQ (LIVER/GB)  Result Date: 05/24/2021 CLINICAL DATA:  Cholelithiasis with concern for acute cholecystitis. EXAM: ULTRASOUND ABDOMEN LIMITED RIGHT UPPER QUADRANT COMPARISON:  Abdominal CT from earlier the same day FINDINGS: Gallbladder: Layering gallstones. No gallbladder wall thickening or focal tenderness. Common bile duct: Diameter: 4 mm Liver: No focal lesion identified. Within normal limits in parenchymal echogenicity. Portal vein is patent on  color Doppler imaging with normal direction of blood flow towards the liver. IMPRESSION: Cholelithiasis.  No evidence of acute cholecystitis. Electronically Signed   By: Jorje Guild M.D.   On: 05/24/2021  05:09     Medications:    sodium chloride     sodium chloride     sodium chloride      atorvastatin  40 mg Oral Daily   calcitRIOL  0.75 mcg Oral Q M,W,F-HD   Chlorhexidine Gluconate Cloth  6 each Topical Q0600   cholecalciferol  2,000 Units Oral Daily   cinacalcet  30 mg Oral Q supper   ferrous sulfate  325 mg Oral QODAY   gabapentin  100 mg Oral Q M,W,F-1800   hydrALAZINE  10 mg Intravenous Once   insulin aspart  0-9 Units Subcutaneous TID PC & HS   ketotifen  1 drop Both Eyes BID   lanthanum  1,000 mg Oral BID WC   linagliptin  5 mg Oral Daily   midodrine  5 mg Oral TID WC   prochlorperazine  5 mg Intravenous Once   sodium chloride flush  3 mL Intravenous Q12H   sodium chloride, sodium chloride, sodium chloride, acetaminophen **OR** acetaminophen, alteplase, benzonatate, camphor-menthol, diphenhydrAMINE, fluticasone, glucose, heparin, hydrocerin, HYDROmorphone (DILAUDID) injection, lidocaine (PF), lidocaine-prilocaine, loperamide, loratadine, magnesium hydroxide, ondansetron **OR** ondansetron (ZOFRAN) IV, ondansetron (ZOFRAN) IV, pentafluoroprop-tetrafluoroeth, polyethylene glycol, rOPINIRole, senna-docusate, simethicone, sodium chloride flush, vitamin A & D  Assessment/ Plan:  Ms. NERY KALISZ is a 70 y.o.  female  with past medical history of hypertension, diabetes, PVD, dyslipidemia, and end-stage renal disease on dialysis, who was admitted to [facility] on 05/23/2021 for SOB (shortness of breath) [R06.02] ESRD on dialysis ([place]) [N18.6, Z99.2] ESRD on hemodialysis ([place]) [N18.6, Z99.2] Bleeding from dialysis shunt, initial encounter ([place]) [T82.838A]  [facility] /MWF/ Lt thigh AVF  End-stage renal disease with bleeding access on hemodialysis.  Will maintain outpatient  schedule, if possible.  Appreciate vascular performing fisulogram yesterday. Receiving dialysis today. 2 hours into treatment, access began to malfunction and treatment terminated. Contacted vascular for permcath placement tomorrow. Next treatment scheduled for Thursday, if remains stable   2. Anemia of chronic kidney disease   Hemoglobin remains at goal    3. Secondary Hyperparathyroidism: We will continue to monitor bone mineral metabolism during this admission.   4. Diabetes mellitus type II with chronic kidney disease insulin dependent. Home regimen includes Lantus and Januvia. Glucose elevated today    LOS: 2 Moncia Annas 12/7/202212:21 PM

## 2021-05-25 NOTE — Progress Notes (Signed)
[facility] [place] NAME: Amy Wu    MR#:  [id number]  DATE OF BIRTH:  [birth year]  SUBJECTIVE:  Feeling fine. Tolerating diet. No more bleeding REVIEW OF SYSTEMS:   Review of Systems  Constitutional:  Negative for chills, fever and weight loss.  HENT:  Negative for ear discharge, ear pain and nosebleeds.   Eyes:  Negative for blurred vision, pain and discharge.  Respiratory:  Negative for sputum production, shortness of breath, wheezing and stridor.   Cardiovascular:  Negative for chest pain, palpitations, orthopnea and PND.  Gastrointestinal:  Negative for abdominal pain, diarrhea, nausea and vomiting.  Genitourinary:  Negative for frequency and urgency.  Musculoskeletal:  Negative for back pain and joint pain.  Neurological:  Positive for weakness. Negative for sensory change, speech change and focal weakness.  Psychiatric/Behavioral:  Negative for depression and hallucinations. The patient is not nervous/anxious.     DRUG ALLERGIES:   Allergies  Allergen Reactions   Ancef [Cefazolin] Palpitations   Contrast Media [Iodinated Diagnostic Agents] Palpitations    VITALS:  Blood pressure (!) 83/65, pulse 89, temperature 97.7 F (36.5 C), temperature source Oral, resp. rate (!) 25, height 5' (1.524 m), weight 92.1 kg, SpO2 98 %.  PHYSICAL EXAMINATION:   Physical Exam  GENERAL:  70 y.o.-year-old patient lying in the bed with no acute distress. The HEENT: Head atraumatic, normocephalic. Oropharynx and nasopharynx clear.  LUNGS: Normal breath sounds bilaterally, no wheezing, rales, rhonchi. No use of accessory muscles of respiration.  CARDIOVASCULAR: S1, S2 normal. Soft systolic murmur, no rubs, or gallops.  ABDOMEN: Soft, nontender, nondistended. Bowel sounds present. No organomegaly or mass.  EXTREMITIES:    No bleeding at left AV fistula site NEUROLOGIC: nonfocal PSYCHIATRIC:  patient is alert and oriented x 3.  SKIN: No  obvious rash, lesion, or ulcer. S/p b/l LE amputations  LABORATORY PANEL:  CBC Recent Labs  Lab 05/24/21 0528  WBC 8.0  HGB 9.3*  HCT 27.8*  PLT 198     Chemistries  Recent Labs  Lab 05/24/21 0528  NA 135  K 3.8  CL 97*  CO2 28  GLUCOSE 210*  BUN 36*  CREATININE 8.05*  CALCIUM 8.8*    Cardiac Enzymes No results for input(s): TROPONINI in the last 168 hours. RADIOLOGY:  CT HEAD WO CONTRAST (5MM)  Result Date: 05/23/2021 CLINICAL DATA:  Neck pain.  Altered mental status. EXAM: CT HEAD WITHOUT CONTRAST CT CERVICAL SPINE WITHOUT CONTRAST TECHNIQUE: Multidetector CT imaging of the head and cervical spine was performed following the standard protocol without intravenous contrast. Multiplanar CT image reconstructions of the cervical spine were also generated. COMPARISON:  None. FINDINGS: CT HEAD FINDINGS Brain: Mild age-related atrophy and chronic microvascular ischemic changes. There is no acute intracranial hemorrhage. No mass effect or midline shift. No extra-axial fluid collection. Vascular: No hyperdense vessel or unexpected calcification. Skull: Normal. Negative for fracture or focal lesion. Sinuses/Orbits: The visualized paranasal sinuses and mastoid air cells are clear. Right scleral banding. Other: None CT CERVICAL SPINE FINDINGS Alignment: No acute subluxation. Skull base and vertebrae: No acute fracture. Soft tissues and spinal canal: No prevertebral fluid or swelling. No visible canal hematoma. Disc levels: Multiple degenerative changes and multilevel facet arthropathy. There is erosive changes of the atlantoaxial space. Upper chest: Negative. Other: Bilateral carotid bulb calcified plaques and calcification the vertebral arteries at the foramen magnum. IMPRESSION: 1. No acute intracranial pathology. Mild age-related atrophy and chronic microvascular ischemic changes. 2.  No acute/traumatic cervical spine pathology. Multilevel degenerative changes. Electronically Signed   By:  Anner Crete M.D.   On: 05/23/2021 22:52   CT CERVICAL SPINE WO CONTRAST  Result Date: 05/23/2021 CLINICAL DATA:  Neck pain.  Altered mental status. EXAM: CT HEAD WITHOUT CONTRAST CT CERVICAL SPINE WITHOUT CONTRAST TECHNIQUE: Multidetector CT imaging of the head and cervical spine was performed following the standard protocol without intravenous contrast. Multiplanar CT image reconstructions of the cervical spine were also generated. COMPARISON:  None. FINDINGS: CT HEAD FINDINGS Brain: Mild age-related atrophy and chronic microvascular ischemic changes. There is no acute intracranial hemorrhage. No mass effect or midline shift. No extra-axial fluid collection. Vascular: No hyperdense vessel or unexpected calcification. Skull: Normal. Negative for fracture or focal lesion. Sinuses/Orbits: The visualized paranasal sinuses and mastoid air cells are clear. Right scleral banding. Other: None CT CERVICAL SPINE FINDINGS Alignment: No acute subluxation. Skull base and vertebrae: No acute fracture. Soft tissues and spinal canal: No prevertebral fluid or swelling. No visible canal hematoma. Disc levels: Multiple degenerative changes and multilevel facet arthropathy. There is erosive changes of the atlantoaxial space. Upper chest: Negative. Other: Bilateral carotid bulb calcified plaques and calcification the vertebral arteries at the foramen magnum. IMPRESSION: 1. No acute intracranial pathology. Mild age-related atrophy and chronic microvascular ischemic changes. 2. No acute/traumatic cervical spine pathology. Multilevel degenerative changes. Electronically Signed   By: Anner Crete M.D.   On: 05/23/2021 22:52   MR BRAIN WO CONTRAST  Result Date: 05/24/2021 CLINICAL DATA:  Mental status changes of unknown cause beginning yesterday. EXAM: MRI HEAD WITHOUT CONTRAST TECHNIQUE: Multiplanar, multiecho pulse sequences of the brain and surrounding structures were obtained without intravenous contrast. COMPARISON:   Head CT yesterday.  MRI 06/24/2010. FINDINGS: Brain: Diffusion imaging does not show any acute or subacute infarction. 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 No brainstem abnormality is seen. There are old small vessel infarctions of the basal ganglia and there are minor chronic small-vessel ischemic changes of the cerebral hemispheric white matter. There is a single old small left parietal cortical infarction. No mass lesion, hemorrhage, hydrocephalus or extra-axial collection. Scattered punctate foci of hemosiderin deposition within the brain probably relate to chronic hypertension. Vascular: Major vessels at the base of the brain show flow. Skull and upper cervical spine: Negative Sinuses/Orbits: Clear sinuses. Previous scleral banding on the right. Other: None IMPRESSION: No acute or reversible finding. 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 Tiny old left parietal cortical infarction at the vertex. Punctate foci of hemosiderin deposition within the brain likely secondary to chronic hypertension. Electronically Signed   By: Nelson Chimes M.D.   On: 05/24/2021 16:05   PERIPHERAL VASCULAR CATHETERIZATION  Result Date: 05/24/2021 See surgical note for result.  DG Chest Port 1 View  Result Date: 05/23/2021 CLINICAL DATA:  Palpitation.  Shortness of breath. EXAM: PORTABLE CHEST 1 VIEW COMPARISON:  Chest radiograph dated 07/30/2020. FINDINGS: Minimal bibasilar atelectasis. No focal consolidation, pleural effusion, or pneumothorax. The cardiac silhouette is within limits. No acute osseous pathology. Degenerative changes of the spine and shoulders. IMPRESSION: No active disease. Electronically Signed   By: Anner Crete M.D.   On: 05/23/2021 22:19   EEG adult  Result Date: 05/25/2021 Amy Jack, MD     05/25/2021 10:42 AM Routine EEG Report Amy Wu is a 70 y.o. female with a history of encephalopathy who is undergoing an  EEG to evaluate for seizures. Report: This EEG was  acquired with electrodes placed according to the International 10-20 electrode system (including Fp1, Fp2, F3, F4, C3, C4, P3, P4, O1, O2, T3, T4, T5, T6, A1, A2, Fz, Cz, Pz). The following electrodes were missing or displaced: none. The occipital dominant rhythm was 7-8 Hz. This activity is reactive to stimulation. Drowsiness was manifested by background fragmentation; deeper stages of sleep were identified by K complexes and sleep spindles. There was no focal slowing. There were no interictal epileptiform discharges. There were no electrographic seizures identified. There was no abnormal response to photic stimulation or hyperventilation. Impression and clinical correlation: This EEG was obtained while awake and asleep and is abnormal due to mild diffuse slowing indicative of global cerebral dysfunction. Epileptiform abnormalities were not seen during this recording. Amy Monks, MD [facility] [phone] If 7pm- 7am, please page neurology on call as listed in [facility].   CT RENAL STONE STUDY  Result Date: 05/24/2021 CLINICAL DATA:  Mid abdominal pain. EXAM: CT ABDOMEN AND PELVIS WITHOUT CONTRAST TECHNIQUE: Multidetector CT imaging of the abdomen and pelvis was performed following the standard protocol without IV contrast. COMPARISON:  None. FINDINGS: Lower chest: Mild atelectasis is seen within the left lung base. Marked severity coronary artery calcification is noted. Hepatobiliary: No focal liver abnormality is seen. Numerous gallstones are seen within the gallbladder lumen. There is no evidence of gallbladder wall thickening or biliary dilatation. A trace amount of pericholecystic inflammation is noted. Pancreas: Unremarkable. No pancreatic ductal dilatation or surrounding inflammatory changes. Spleen: Normal in size without focal abnormality. Adrenals/Urinary Tract: Adrenal glands are unremarkable. Small native kidneys are seen with multiple  bilateral simple renal cysts noted. The largest measures approximately 2.9 cm in diameter and is seen within the anteromedial aspect of the mid to upper right kidney. 4 mm and 6 mm calcifications are seen within the expected region of the left UPJ (best seen on coronal reformatted image 84, CT series 5). A similar appearing 1.4 cm calcification is noted along the expected region of the right UPJ (coronal reformatted image 80, CT series 5). The urinary bladder is empty and subsequently limited in evaluation. Stomach/Bowel: There is a small hiatal hernia. Appendix appears normal. No evidence of bowel wall thickening, distention, or inflammatory changes. Vascular/Lymphatic: Aortic atherosclerosis with marked severity calcification of the arterial structures of abdomen and pelvis. Bilateral common iliac artery stents are seen. With additional postoperative grafts noted within the left inguinal region. No enlarged abdominal or pelvic lymph nodes. Reproductive: Uterus and bilateral adnexa are unremarkable. Other: Surgical mesh is noted along the anterior at aspect of the lower abdominal wall. A catheter is seen looped within the right lower quadrant. No abdominopelvic ascites. Musculoskeletal: Multilevel degenerative changes seen throughout the lumbar spine. IMPRESSION: 1. Cholelithiasis with a trace amount of pericholecystic inflammatory fat stranding. Further evaluation with a right upper quadrant ultrasound or nuclear medicine hepatobiliary scan is recommended, as early acute cholecystitis cannot be excluded. 2. Small native kidneys with multiple bilateral simple renal cysts. 3. Findings suspicious for bilateral proximal ureteral calculi, as described above. 4. Small hiatal hernia. 5. Catheter within the lower abdomen which may represent a fractured peritoneal dialysis catheter. 6. Aortic atherosclerosis. Aortic Atherosclerosis (ICD10-I70.0). Electronically Signed   By: Virgina Norfolk M.D.   On: 05/24/2021 01:33    US Abdomen Limited RUQ (LIVER/GB)  Result Date: 05/24/2021 CLINICAL DATA:  Cholelithiasis with concern for acute cholecystitis. EXAM: ULTRASOUND ABDOMEN LIMITED RIGHT UPPER QUADRANT COMPARISON:  Abdominal CT from earlier the same day FINDINGS: Gallbladder: Layering gallstones. No  gallbladder wall thickening or focal tenderness. Common bile duct: Diameter: 4 mm Liver: No focal lesion identified. Within normal limits in parenchymal echogenicity. Portal vein is patent on color Doppler imaging with normal direction of blood flow towards the liver. IMPRESSION: Cholelithiasis.  No evidence of acute cholecystitis. Electronically Signed   By: Jorje Guild M.D.   On: 05/24/2021 05:09   ASSESSMENT AND PLAN:  LASHANTI CHAMBLESS is a 70 y.o. African-American female with medical history significant for end-stage renal disease on hemodialysis Monday Wednesday Friday, hypertension, dyslipidemia, type diabetes mellitus and peripheral vascular disease, who presented to the ER for acute onset of bleeding from her left thigh dialysis AV fistula.  When she woke up she noted that it was spurting blood.  Left femoral AV graft bleeding Hemodialysis access -- hemoglobin stable 11.1 -- no active bleeding -- seen by vascular surgery, is s/p fistulogram with angioplasty 12/6 - stopped functioning during dialysis today - for dialysis catheter placement tomorrow  end-stage renal disease on hemodialysis -- Dr. Juleen China consulted for in-house dialysis  Hypotension -- improved after IV fluid bolus in the ER but still hypotensive, mentating clearly -- hold BP meds --started midodrine  Encephalopathy - neuro consulted, advised mri, eeg. Both neg. Encephalopathy resolved - monitor  Dyslipidemia continue statins  type II diabetes with peripheral neuropathy and end-stage renal disease -- sliding scale insulin -- add semglee for home lantus 7 qhs, cont home tradjenta  restless leg syndrome -- continue  requip  Procedures: Family communication :pt's brother Consults : vascular, nephrology CODE STATUS: full DVT Prophylaxis : scd Level of care: Med-Surg Status is: Inpatient          TOTAL TIME TAKING CARE OF THIS PATIENT: 35 minutes.  >50% time spent on counselling and coordination of care  Note: This dictation was prepared with Dragon dictation along with smaller phrase technology. Any transcriptional errors that result from this process are unintentional.  Desma Maxim M.D    [facility]   CC: Primary care physician; System, Provider Not In Patient ID: Amy Wu, female   DOB: 30-Jan-1951, 70 y.o.   MRN: [id number]

## 2021-05-26 ENCOUNTER — Encounter
Admission: EM | Disposition: A | Discharge status: Home / Self Care | Payer: Self-pay | Source: Home / Self Care | Attending: Internal Medicine

## 2021-05-26 ENCOUNTER — Encounter: Payer: Self-pay | Admitting: Vascular Surgery

## 2021-05-26 DIAGNOSIS — N185 Chronic kidney disease, stage 5: Secondary | ICD-10-CM

## 2021-05-26 HISTORY — PX: DIALYSIS/PERMA CATHETER INSERTION: CATH118288

## 2021-05-26 LAB — GLUCOSE, CAPILLARY
Glucose-Capillary: 184 mg/dL — ABNORMAL HIGH (ref 70–99)
Glucose-Capillary: 164 mg/dL — ABNORMAL HIGH (ref 70–99)

## 2021-05-26 LAB — BASIC METABOLIC PANEL
Anion gap: 12 (ref 5–15)
BUN: 51 mg/dL — ABNORMAL HIGH (ref 8–23)
CO2: 30 mmol/L (ref 22–32)
Calcium: 9.2 mg/dL (ref 8.9–10.3)
Chloride: 96 mmol/L — ABNORMAL LOW (ref 98–111)
Creatinine, Ser: 8.86 mg/dL — ABNORMAL HIGH (ref 0.44–1.00)
GFR, Estimated: 4 mL/min — ABNORMAL LOW (ref >60)
Glucose, Bld: 204 mg/dL — ABNORMAL HIGH (ref 70–99)
Potassium: 4.6 mmol/L (ref 3.5–5.1)
Sodium: 138 mmol/L (ref 135–145)

## 2021-05-26 SURGERY — DIALYSIS/PERMA CATHETER INSERTION
Anesthesia: Moderate Sedation

## 2021-05-26 MED ORDER — HEPARIN SODIUM (PORCINE) 5000 UNIT/ML IJ SOLN
5000.0000 [IU] | Freq: Three times a day (TID) | INTRAMUSCULAR | Status: DC
  Administered 2021-05-26: 5000 [IU] via SUBCUTANEOUS
  Filled 2021-05-26: qty 1

## 2021-05-26 MED ORDER — ONDANSETRON HCL 4 MG/2ML IJ SOLN: 4.0000 mg | Freq: Four times a day (QID) | INTRAMUSCULAR | Status: DC | PRN

## 2021-05-26 MED ORDER — HEPARIN SODIUM (PORCINE) 10000 UNIT/ML IJ SOLN
INTRAMUSCULAR | Status: AC
  Filled 2021-05-26: qty 1

## 2021-05-26 MED ORDER — HYDROMORPHONE HCL 1 MG/ML IJ SOLN: 1.0000 mg | Freq: Once | INTRAMUSCULAR | Status: DC | PRN

## 2021-05-26 MED ORDER — FENTANYL CITRATE (PF) 100 MCG/2ML IJ SOLN
INTRAMUSCULAR | Status: DC | PRN
  Administered 2021-05-26: 50 ug via INTRAVENOUS

## 2021-05-26 MED ORDER — METHYLPREDNISOLONE SODIUM SUCC 125 MG IJ SOLR: 125.0000 mg | Freq: Once | INTRAMUSCULAR | Status: DC | PRN

## 2021-05-26 MED ORDER — SODIUM CHLORIDE 0.9 % IV SOLN: INTRAVENOUS | Status: DC

## 2021-05-26 MED ORDER — DIPHENHYDRAMINE HCL 50 MG/ML IJ SOLN: 50.0000 mg | Freq: Once | INTRAMUSCULAR | Status: DC | PRN

## 2021-05-26 MED ORDER — FENTANYL CITRATE PF 50 MCG/ML IJ SOSY
PREFILLED_SYRINGE | INTRAMUSCULAR | Status: AC
  Filled 2021-05-26: qty 1

## 2021-05-26 MED ORDER — MIDAZOLAM HCL 2 MG/2ML IJ SOLN
INTRAMUSCULAR | Status: AC
  Filled 2021-05-26: qty 2

## 2021-05-26 MED ORDER — MIDAZOLAM HCL 2 MG/2ML IJ SOLN
INTRAMUSCULAR | Status: DC | PRN
  Administered 2021-05-26: 2 mg via INTRAVENOUS

## 2021-05-26 MED ORDER — CLINDAMYCIN PHOSPHATE 300 MG/50ML IV SOLN: 300.0000 mg | Freq: Once | INTRAVENOUS | Status: DC

## 2021-05-26 MED ORDER — CLINDAMYCIN PHOSPHATE 300 MG/50ML IV SOLN
INTRAVENOUS | Status: AC
  Filled 2021-05-26: qty 50

## 2021-05-26 MED ORDER — MIDAZOLAM HCL 2 MG/ML PO SYRP
8.0000 mg | ORAL_SOLUTION | Freq: Once | ORAL | Status: DC | PRN
  Filled 2021-05-26: qty 4

## 2021-05-26 MED ORDER — FAMOTIDINE 20 MG PO TABS: 40.0000 mg | ORAL_TABLET | Freq: Once | ORAL | Status: DC | PRN

## 2021-05-26 SURGICAL SUPPLY — 9 items
CANNULA 5F STIFF (CANNULA) ×2 IMPLANT
CATH CANNON HEMO 15FR 31CM (HEMODIALYSIS SUPPLIES) ×2 IMPLANT
COVER PROBE U/S 5X48 (MISCELLANEOUS) ×2 IMPLANT
GAUZE SPONGE 4X4 12PLY STRL (GAUZE/BANDAGES/DRESSINGS) ×2 IMPLANT
PACK ANGIOGRAPHY (CUSTOM PROCEDURE TRAY) ×2 IMPLANT
SUT MNCRL 4-0 (SUTURE) ×1
SUT MNCRL 4-0 27XMFL (SUTURE) ×1
SUT PROLENE 0 CT 1 30 (SUTURE) ×2 IMPLANT
SUTURE MNCRL 4-0 27XMF (SUTURE) ×1 IMPLANT

## 2021-05-26 NOTE — Care Management Important Message (Signed)
Important Message  Patient Details  Name: Amy Wu MRN: [id number] Date of Birth: 01/15/1951   Medicare Important Message Given:  Yes     Dannette Barbara 05/26/2021, 2:43 PM

## 2021-05-26 NOTE — Interval H&P Note (Signed)
History and Physical Interval Note:  05/26/2021 9:24 AM  Amy Wu  has presented today for surgery, with the diagnosis of ESRD.  The various methods of treatment have been discussed with the patient and family. After consideration of risks, benefits and other options for treatment, the patient has consented to  Procedure(s): DIALYSIS/PERMA CATHETER INSERTION (N/A) as a surgical intervention.  The patient's history has been reviewed, patient examined, no change in status, stable for surgery.  I have reviewed the patient's chart and labs.  Questions were answered to the patient's satisfaction.     [name]

## 2021-05-26 NOTE — Progress Notes (Signed)
[facility]  ROUNDING NOTE   Subjective:   Patient seen resting quietly after Permcath placement Denies pain and discomfort  Dialysis yesterday, tolerated well. Treatment terminated early due to access malfunction.   Objective:  Vital signs in last 24 hours:  Temp:  [97.6 F (36.4 C)-98.7 F (37.1 C)] 97.6 F (36.4 C) (12/08 0857) Pulse Rate:  [57-89] 62 (12/08 1030) Resp:  [8-17] 13 (12/08 1030) BP: (83-170)/(38-69) 141/47 (12/08 1030) SpO2:  [98 %-100 %] 100 % (12/08 1030)  Weight change:  Filed Weights   05/23/21 1703 05/25/21 0757 05/25/21 0909  Weight: 92.1 kg 92.1 kg 92.1 kg    Intake/Output: I/O last 3 completed shifts: In: -  Out: 726 [Other:726]   Intake/Output this shift:  Total I/O In: 120 [P.O.:120] Out: -   Physical Exam: General: NAD, resting in bed  Head: Normocephalic, atraumatic. Moist oral mucosal membranes  Eyes: Anicteric  Lungs:  Clear to auscultation, normal effort  Heart: Regular rate and rhythm  Abdomen:  Soft, nontender  Extremities: No peripheral edema.  Neurologic: Nonfocal, moving all four extremities  Skin: No lesions  Access: Left thigh aVF    Basic Metabolic Panel: Recent Labs  Lab 05/23/21 0343 05/24/21 0528 05/26/21 0508  NA 138 135 138  K 4.2 3.8 4.6  CL 97* 97* 96*  CO2 30 28 30   GLUCOSE 210* 210* 204*  BUN 43* 36* 51*  CREATININE 9.64* 8.05* 8.86*  CALCIUM 9.0 8.8* 9.2  PHOS  --  2.5  --      Liver Function Tests: No results for input(s): AST, ALT, ALKPHOS, BILITOT, PROT, ALBUMIN in the last 168 hours. No results for input(s): LIPASE, AMYLASE in the last 168 hours. No results for input(s): AMMONIA in the last 168 hours.  CBC: Recent Labs  Lab 05/23/21 0343 05/24/21 0528  WBC 8.7 8.0  NEUTROABS 5.1  --   HGB 11.9* 9.3*  HCT 36.6 27.8*  MCV 81.2 79.4*  PLT 226 198     Cardiac Enzymes: No results for input(s): CKTOTAL, CKMB, CKMBINDEX, TROPONINI in the last 168  hours.  BNP: Invalid input(s): POCBNP  CBG: Recent Labs  Lab 05/25/21 0752 05/25/21 1617 05/25/21 2042 05/26/21 0744 05/26/21 1145  GLUCAP 369* 304* 193* 184* 164*     Microbiology: Results for orders placed or performed during the hospital encounter of 05/23/21  Resp Panel by RT-PCR (Flu A&B, Covid) Nasopharyngeal Swab     Status: None   Collection Time: 05/23/21  5:01 AM   Specimen: Nasopharyngeal Swab; Nasopharyngeal(NP) swabs in vial transport medium  Result Value Ref Range Status   SARS Coronavirus 2 by RT PCR NEGATIVE NEGATIVE Final    Comment: (NOTE) SARS-CoV-2 target nucleic acids are NOT DETECTED.  The SARS-CoV-2 RNA is generally detectable in upper respiratory specimens during the acute phase of infection. The lowest concentration of SARS-CoV-2 viral copies this assay can detect is 138 copies/mL. A negative result does not preclude SARS-Cov-2 infection and should not be used as the sole basis for treatment or other patient management decisions. A negative result may occur with  improper specimen collection/handling, submission of specimen other than nasopharyngeal swab, presence of viral mutation(s) within the areas targeted by this assay, and inadequate number of viral copies(<138 copies/mL). A negative result must be combined with clinical observations, patient history, and epidemiological information. The expected result is Negative.  Fact Sheet for Patients:  EntrepreneurPulse.com.au  Fact Sheet for Healthcare Providers:  IncredibleEmployment.be  This test is no t  yet approved or cleared by the [place] and  has been authorized for detection and/or diagnosis of SARS-CoV-2 by FDA under an Emergency Use Authorization (EUA). This EUA will remain  in effect (meaning this test can be used) for the duration of the COVID-19 declaration under Section 564(b)(1) of the Act, 21 U.S.C.section 360bbb-3(b)(1), unless the  authorization is terminated  or revoked sooner.       Influenza A by PCR NEGATIVE NEGATIVE Final   Influenza B by PCR NEGATIVE NEGATIVE Final    Comment: (NOTE) The Xpert Xpress SARS-CoV-2/FLU/RSV plus assay is intended as an aid in the diagnosis of influenza from Nasopharyngeal swab specimens and should not be used as a sole basis for treatment. Nasal washings and aspirates are unacceptable for Xpert Xpress SARS-CoV-2/FLU/RSV testing.  Fact Sheet for Patients: EntrepreneurPulse.com.au  Fact Sheet for Healthcare Providers: IncredibleEmployment.be  This test is not yet approved or cleared by the [place] FDA and has been authorized for detection and/or diagnosis of SARS-CoV-2 by FDA under an Emergency Use Authorization (EUA). This EUA will remain in effect (meaning this test can be used) for the duration of the COVID-19 declaration under Section 564(b)(1) of the Act, 21 U.S.C. section 360bbb-3(b)(1), unless the authorization is terminated or revoked.  Performed at [facility], [address] [place], [place] [address]     Coagulation Studies: No results for input(s): LABPROT, INR in the last 72 hours.   Urinalysis: No results for input(s): COLORURINE, LABSPEC, PHURINE, GLUCOSEU, HGBUR, BILIRUBINUR, KETONESUR, PROTEINUR, UROBILINOGEN, NITRITE, LEUKOCYTESUR in the last 72 hours.  Invalid input(s): APPERANCEUR    Imaging: MR BRAIN WO CONTRAST  Result Date: 05/24/2021 CLINICAL DATA:  Mental status changes of unknown cause beginning yesterday. EXAM: MRI HEAD WITHOUT CONTRAST TECHNIQUE: Multiplanar, multiecho pulse sequences of the brain and surrounding structures were obtained without intravenous contrast. COMPARISON:  Head CT yesterday.  MRI 06/24/2010. FINDINGS: Brain: Diffusion imaging does not show any acute or subacute infarction. 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 No brainstem abnormality is seen. There  are old small vessel infarctions of the basal ganglia and there are minor chronic small-vessel ischemic changes of the cerebral hemispheric white matter. There is a single old small left parietal cortical infarction. No mass lesion, hemorrhage, hydrocephalus or extra-axial collection. Scattered punctate foci of hemosiderin deposition within the brain probably relate to chronic hypertension. Vascular: Major vessels at the base of the brain show flow. Skull and upper cervical spine: Negative Sinuses/Orbits: Clear sinuses. Previous scleral banding on the right. Other: None IMPRESSION: No acute or reversible finding. 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 Tiny old left parietal cortical infarction at the vertex. Punctate foci of hemosiderin deposition within the brain likely secondary to chronic hypertension. Electronically Signed   By: Nelson Chimes M.D.   On: 05/24/2021 16:05   PERIPHERAL VASCULAR CATHETERIZATION  Result Date: 05/26/2021 See surgical note for result.  PERIPHERAL VASCULAR CATHETERIZATION  Result Date: 05/24/2021 See surgical note for result.  EEG adult  Result Date: 05/25/2021 Derek Jack, MD     05/25/2021 10:42 AM Routine EEG Report TEKELIA KAREEM is a 69 y.o. female with a history of encephalopathy who is undergoing an EEG to evaluate for seizures. Report: This EEG was acquired with electrodes placed according to the International 10-20 electrode system (including Fp1, Fp2, F3, F4, C3, C4, P3, P4, O1, O2, T3, T4, T5, T6, A1, A2, Fz, Cz, Pz). The following electrodes were  missing or displaced: none. The occipital dominant rhythm was 7-8 Hz. This activity is reactive to stimulation. Drowsiness was manifested by background fragmentation; deeper stages of sleep were identified by K complexes and sleep spindles. There was no focal slowing. There were no interictal epileptiform discharges. There were no electrographic seizures  identified. There was no abnormal response to photic stimulation or hyperventilation. Impression and clinical correlation: This EEG was obtained while awake and asleep and is abnormal due to mild diffuse slowing indicative of global cerebral dysfunction. Epileptiform abnormalities were not seen during this recording. Su Monks, MD [facility] [phone] If 7pm- 7am, please page neurology on call as listed in [place].     Medications:    sodium chloride     sodium chloride     sodium chloride     clindamycin      atorvastatin  40 mg Oral Daily   calcitRIOL  0.75 mcg Oral Q M,W,F-HD   Chlorhexidine Gluconate Cloth  6 each Topical Q0600   cholecalciferol  2,000 Units Oral Daily   cinacalcet  30 mg Oral Q supper   fentaNYL       ferrous sulfate  325 mg Oral QODAY   gabapentin  100 mg Oral Q M,W,F-1800   heparin       heparin injection (subcutaneous)  5,000 Units Subcutaneous Q8H   hydrALAZINE  10 mg Intravenous Once   insulin aspart  0-9 Units Subcutaneous TID PC & HS   insulin glargine-yfgn  7 Units Subcutaneous QHS   ketotifen  1 drop Both Eyes BID   lanthanum  1,000 mg Oral BID WC   linagliptin  5 mg Oral Daily   midazolam       midodrine  5 mg Oral TID WC   prochlorperazine  5 mg Intravenous Once   sodium chloride flush  3 mL Intravenous Q12H   sodium chloride, sodium chloride, sodium chloride, acetaminophen **OR** acetaminophen, alteplase, benzonatate, camphor-menthol, diphenhydrAMINE, fluticasone, glucose, heparin, hydrocerin, HYDROmorphone (DILAUDID) injection, HYDROmorphone (DILAUDID) injection, lidocaine (PF), lidocaine-prilocaine, loperamide, loratadine, magnesium hydroxide, ondansetron **OR** ondansetron (ZOFRAN) IV, ondansetron (ZOFRAN) IV, pentafluoroprop-tetrafluoroeth, polyethylene glycol, rOPINIRole, senna-docusate, simethicone, sodium chloride flush, vitamin A & D  Assessment/ Plan:  Ms. SAHMYA ARAI is a 70 y.o.  female  with past medical history of  hypertension, diabetes, PVD, dyslipidemia, and end-stage renal disease on dialysis, who was admitted to [facility] on 05/23/2021 for SOB (shortness of breath) [R06.02] ESRD on dialysis ([place]) [N18.6, Z99.2] ESRD on hemodialysis ([place]) [N18.6, Z99.2] Bleeding from dialysis shunt, initial encounter ([place]) [T82.838A]  [facility] [place]/MWF/ Lt thigh AVF  End-stage renal disease with bleeding access on hemodialysis.  Will maintain outpatient schedule, if possible.  Appreciate vascular placing permcath. Received dialysis yesterday, but terminated early due to fistula malfunction. Next treatment scheduled for tomorrow.    2. Anemia of chronic kidney disease   Hemoglobin stable   3. Secondary Hyperparathyroidism: Calcium and phosphorus within goal   4. Diabetes mellitus type II with chronic kidney disease insulin dependent. Home regimen includes Lantus and Januvia. Glucose elevated but improved from yesterday. SSI managed by primary team    LOS: 3 Dickey Caamano 12/8/202212:24 PM

## 2021-05-26 NOTE — Progress Notes (Signed)
Discharge instructions given to patient.  Right groin dialysis catheter intact, dressing dry, clean, and intact.  Encouraged patient to call the doctor for questions.  Needs addressed.  Discharged home with [facility].

## 2021-05-26 NOTE — Discharge Summary (Signed)
Amy Wu WIO:[id number] DOB: [birth year] DOA: 05/23/2021  PCP: System, Provider Not In  Admit date: 05/23/2021 Discharge date: 05/26/2021  Time spent: 40 minutes  Recommendations for Outpatient Follow-up:  Resume m/w/f dialysis tomorrow Vascular surgery f/u for long-term access planning     Discharge Diagnoses:  Principal Problem:   ESRD on dialysis [facility])   Discharge Condition: stable  Diet recommendation: low sodium heart healthy carb modified  Filed Weights   05/23/21 1703 05/25/21 0757 05/25/21 0909  Weight: 92.1 kg 92.1 kg 92.1 kg    History of present illness:  Amy Wu is a 70 y.o. African-American female with medical history significant for end-stage renal disease on hemodialysis Monday Wednesday Friday, hypertension, dyslipidemia, type diabetes mellitus and peripheral vascular disease, who presented to the ER for acute onset of bleeding from her left thigh dialysis AV fistula.  When she woke up she noted that it was spurting blood.  Blood was everywhere including the walls and the ceiling.  There was significant amount of blood at the scene per EMS and it was pulsatile from the leg.  They applied pressure and brought her to the ER.  By that time she was no longer bleeding.  She denies any presyncope or syncope, chest pain or palpitations or headache or dizziness but admitted to mild blurred vision then.  She is due for hemodialysis today.  Hospital Course:  Presented with bleeding from left thigh AV fistula. Fistulogram performed 12/6 with angioplasty. Hemodialysis performed the following day and the AV graft stopped functioning. Tunneled hemodialysis catheter placed right femoral vein by vascular surgery on 12/8. Nephrology cleared to discharge patient 12/8 to resume m/w/f hemodialysis beginning tomorrow. Will need outpt f/u with vascular surgery for long-term access planning.  Procedures: Tunneled hemodialysis catheter placement right femoral vein 12/8; fistulogram  with angioplasty 12/6  Consultations: Vascular surgery, nephrology  Discharge Exam: Vitals:   05/26/21 1015 05/26/21 1030  BP: (!) 99/51 (!) 141/47  Pulse: 62 62  Resp: 12 13  Temp:    SpO2: 100% 100%    GENERAL:  70 y.o.-year-old patient lying in the bed with no acute distress. The HEENT: Head atraumatic, normocephalic. Oropharynx and nasopharynx clear.  LUNGS: Normal breath sounds bilaterally, no wheezing, rales, rhonchi. No use of accessory muscles of respiration.  CARDIOVASCULAR: S1, S2 normal. Soft systolic murmur, no rubs, or gallops.  ABDOMEN: Soft, nontender, nondistended. Bowel sounds present. No organomegaly or mass.  EXTREMITIES:    No bleeding at left AV fistula site, c/d/I r femoral cath NEUROLOGIC: nonfocal PSYCHIATRIC:  patient is alert and oriented x 3.  SKIN: No obvious rash, lesion, or ulcer. S/p b/l LE amputations  Discharge Instructions   Discharge Instructions     Diet - low sodium heart healthy   Complete by: As directed    Increase activity slowly   Complete by: As directed    No wound care   Complete by: As directed    No pressure ulcer      Allergies as of 05/26/2021       Reactions   Ancef [cefazolin] Palpitations   Contrast Media [iodinated Diagnostic Agents] Palpitations        Medication List     STOP taking these medications    MIRCERA IJ       TAKE these medications    acetaminophen 500 MG tablet Commonly known as: TYLENOL Take 1,000 mg by mouth every 8 (eight) hours as needed for moderate pain.   aspirin EC 81 MG  tablet Take 81 mg by mouth daily.   atorvastatin 40 MG tablet Commonly known as: LIPITOR Take 40 mg by mouth daily.   benzonatate 100 MG capsule Commonly known as: TESSALON Take by mouth 3 (three) times daily as needed for cough.   calcitRIOL 0.25 MCG capsule Commonly known as: ROCALTROL Take 0.25 mcg by mouth daily. Take 3 capsules by mouth three times week at dialysis   camphor-menthol  lotion Commonly known as: SARNA Apply 1 application topically as needed for itching. Twice a day if needed   cinacalcet 30 MG tablet Commonly known as: SENSIPAR Take 30 mg by mouth daily with supper.   diphenhydrAMINE 12.5 MG/5ML elixir Commonly known as: BENADRYL Take 12.5 mg by mouth every 6 (six) hours as needed.   eucerin cream Apply 1 application topically 4 (four) times daily as needed for dry skin.   ferrous sulfate 325 (65 FE) MG tablet Take 325 mg by mouth every other day.   fluticasone 50 MCG/ACT nasal spray Commonly known as: FLONASE Place 1 spray into both nostrils daily.   gabapentin 100 MG capsule Commonly known as: NEURONTIN Take 100 mg by mouth 3 (three) times daily. M/W/F after dialysis   glucose 4 GM chewable tablet Chew 1 tablet by mouth as needed for low blood sugar. If blood sugar less than 70. Chew 3 tablets and recheck blood sugar in 15 minutes   glucose blood test strip 1 each by Other route as needed for other. Use as instructed   Januvia 25 MG tablet Generic drug: sitaGLIPtin Take 25 mg by mouth daily.   ketotifen 0.025 % ophthalmic solution Commonly known as: ZADITOR Place 1 drop into both eyes 2 (two) times daily.   lanthanum 1000 MG chewable tablet Commonly known as: FOSRENOL Chew 1,000 mg by mouth 2 (two) times daily with a meal.   Lantus 100 UNIT/ML injection Generic drug: insulin glargine Inject 7 Units into the skin every evening.   loperamide 2 MG tablet Commonly known as: IMODIUM A-D Take 2 mg by mouth 4 (four) times daily as needed for diarrhea or loose stools. 2 tablets by mouth for onset of loose stool, than 1 tablet by mouth after each loose stool MAX 4 tablets in 24 hours   loratadine 10 MG tablet Commonly known as: CLARITIN Take 10 mg by mouth daily as needed for allergies.   Mucinex DM 30-600 MG Tb12 Take 1 tablet by mouth daily.   nystatin powder Generic drug: nystatin SMARTSIG:Sparingly Topical Daily PRN    polyethylene glycol 17 g packet Commonly known as: MIRALAX / GLYCOLAX Take 17 g by mouth daily as needed for moderate constipation or severe constipation.   RA VITAMINS A & D EX Apply topically as needed.   vitamin A & D ointment Apply topically daily as needed.   rOPINIRole 0.25 MG tablet Commonly known as: REQUIP Take 0.25 mg by mouth at bedtime as needed.   senna-docusate 8.6-50 MG tablet Commonly known as: Senokot-S Take 1-2 tablets by mouth at bedtime as needed for mild constipation or moderate constipation. 1-2 tablets at bedtime if needed   Simethicone 180 MG Caps Take 1 capsule by mouth daily.   Zinc Oxide 13 % Crea Apply 1 application topically as needed for irritation.       Allergies  Allergen Reactions   Ancef [Cefazolin] Palpitations   Contrast Media [Iodinated Diagnostic Agents] Palpitations    Follow-up Information     Dew, Erskine Squibb, MD Follow up.   Specialties: Vascular  Surgery, Radiology, Interventional Cardiology Contact information: [address]  [address] [phone]                  The results of significant diagnostics from this hospitalization (including imaging, microbiology, ancillary and laboratory) are listed below for reference.    Significant Diagnostic Studies: CT HEAD WO CONTRAST (5MM)  Result Date: 05/23/2021 CLINICAL DATA:  Neck pain.  Altered mental status. EXAM: CT HEAD WITHOUT CONTRAST CT CERVICAL SPINE WITHOUT CONTRAST TECHNIQUE: Multidetector CT imaging of the head and cervical spine was performed following the standard protocol without intravenous contrast. Multiplanar CT image reconstructions of the cervical spine were also generated. COMPARISON:  None. FINDINGS: CT HEAD FINDINGS Brain: Mild age-related atrophy and chronic microvascular ischemic changes. There is no acute intracranial hemorrhage. No mass effect or midline shift. No extra-axial fluid collection. Vascular: No hyperdense vessel or unexpected  calcification. Skull: Normal. Negative for fracture or focal lesion. Sinuses/Orbits: The visualized paranasal sinuses and mastoid air cells are clear. Right scleral banding. Other: None CT CERVICAL SPINE FINDINGS Alignment: No acute subluxation. Skull base and vertebrae: No acute fracture. Soft tissues and spinal canal: No prevertebral fluid or swelling. No visible canal hematoma. Disc levels: Multiple degenerative changes and multilevel facet arthropathy. There is erosive changes of the atlantoaxial space. Upper chest: Negative. Other: Bilateral carotid bulb calcified plaques and calcification the vertebral arteries at the foramen magnum. IMPRESSION: 1. No acute intracranial pathology. Mild age-related atrophy and chronic microvascular ischemic changes. 2. No acute/traumatic cervical spine pathology. Multilevel degenerative changes. Electronically Signed   By: Anner Crete M.D.   On: 05/23/2021 22:52   CT CERVICAL SPINE WO CONTRAST  Result Date: 05/23/2021 CLINICAL DATA:  Neck pain.  Altered mental status. EXAM: CT HEAD WITHOUT CONTRAST CT CERVICAL SPINE WITHOUT CONTRAST TECHNIQUE: Multidetector CT imaging of the head and cervical spine was performed following the standard protocol without intravenous contrast. Multiplanar CT image reconstructions of the cervical spine were also generated. COMPARISON:  None. FINDINGS: CT HEAD FINDINGS Brain: Mild age-related atrophy and chronic microvascular ischemic changes. There is no acute intracranial hemorrhage. No mass effect or midline shift. No extra-axial fluid collection. Vascular: No hyperdense vessel or unexpected calcification. Skull: Normal. Negative for fracture or focal lesion. Sinuses/Orbits: The visualized paranasal sinuses and mastoid air cells are clear. Right scleral banding. Other: None CT CERVICAL SPINE FINDINGS Alignment: No acute subluxation. Skull base and vertebrae: No acute fracture. Soft tissues and spinal canal: No prevertebral fluid or  swelling. No visible canal hematoma. Disc levels: Multiple degenerative changes and multilevel facet arthropathy. There is erosive changes of the atlantoaxial space. Upper chest: Negative. Other: Bilateral carotid bulb calcified plaques and calcification the vertebral arteries at the foramen magnum. IMPRESSION: 1. No acute intracranial pathology. Mild age-related atrophy and chronic microvascular ischemic changes. 2. No acute/traumatic cervical spine pathology. Multilevel degenerative changes. Electronically Signed   By: Anner Crete M.D.   On: 05/23/2021 22:52   MR BRAIN WO CONTRAST  Result Date: 05/24/2021 CLINICAL DATA:  Mental status changes of unknown cause beginning yesterday. EXAM: MRI HEAD WITHOUT CONTRAST TECHNIQUE: Multiplanar, multiecho pulse sequences of the brain and surrounding structures were obtained without intravenous contrast. COMPARISON:  Head CT yesterday.  MRI 06/24/2010. FINDINGS: Brain: Diffusion imaging does not show any acute or subacute infarction. 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 No brainstem abnormality is seen. There are old small vessel infarctions of the basal ganglia and there are minor chronic small-vessel ischemic changes of the cerebral hemispheric white  matter. There is a single old small left parietal cortical infarction. No mass lesion, hemorrhage, hydrocephalus or extra-axial collection. Scattered punctate foci of hemosiderin deposition within the brain probably relate to chronic hypertension. Vascular: Major vessels at the base of the brain show flow. Skull and upper cervical spine: Negative Sinuses/Orbits: Clear sinuses. Previous scleral banding on the right. Other: None IMPRESSION: No acute or reversible finding. 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 Tiny old left parietal cortical infarction at the vertex. Punctate foci of hemosiderin deposition within the brain likely secondary to  chronic hypertension. Electronically Signed   By: Nelson Chimes M.D.   On: 05/24/2021 16:05   PERIPHERAL VASCULAR CATHETERIZATION  Result Date: 05/26/2021 See surgical note for result.  PERIPHERAL VASCULAR CATHETERIZATION  Result Date: 05/24/2021 See surgical note for result.  DG Chest Port 1 View  Result Date: 05/23/2021 CLINICAL DATA:  Palpitation.  Shortness of breath. EXAM: PORTABLE CHEST 1 VIEW COMPARISON:  Chest radiograph dated 07/30/2020. FINDINGS: Minimal bibasilar atelectasis. No focal consolidation, pleural effusion, or pneumothorax. The cardiac silhouette is within limits. No acute osseous pathology. Degenerative changes of the spine and shoulders. IMPRESSION: No active disease. Electronically Signed   By: Anner Crete M.D.   On: 05/23/2021 22:19   EEG adult  Result Date: 05/25/2021 Derek Jack, MD     05/25/2021 10:42 AM Routine EEG Report ARYANI DAFFERN is a 70 y.o. female with a history of encephalopathy who is undergoing an EEG to evaluate for seizures. Report: This EEG was acquired with electrodes placed according to the International 10-20 electrode system (including Fp1, Fp2, F3, F4, C3, C4, P3, P4, O1, O2, T3, T4, T5, T6, A1, A2, Fz, Cz, Pz). The following electrodes were missing or displaced: none. The occipital dominant rhythm was 7-8 Hz. This activity is reactive to stimulation. Drowsiness was manifested by background fragmentation; deeper stages of sleep were identified by K complexes and sleep spindles. There was no focal slowing. There were no interictal epileptiform discharges. There were no electrographic seizures identified. There was no abnormal response to photic stimulation or hyperventilation. Impression and clinical correlation: This EEG was obtained while awake and asleep and is abnormal due to mild diffuse slowing indicative of global cerebral dysfunction. Epileptiform abnormalities were not seen during this recording. Su Monks, MD [facility] [phone] If 7pm- 7am, please page neurology on call as listed in [place].   CT RENAL STONE STUDY  Result Date: 05/24/2021 CLINICAL DATA:  Mid abdominal pain. EXAM: CT ABDOMEN AND PELVIS WITHOUT CONTRAST TECHNIQUE: Multidetector CT imaging of the abdomen and pelvis was performed following the standard protocol without IV contrast. COMPARISON:  None. FINDINGS: Lower chest: Mild atelectasis is seen within the left lung base. Marked severity coronary artery calcification is noted. Hepatobiliary: No focal liver abnormality is seen. Numerous gallstones are seen within the gallbladder lumen. There is no evidence of gallbladder wall thickening or biliary dilatation. A trace amount of pericholecystic inflammation is noted. Pancreas: Unremarkable. No pancreatic ductal dilatation or surrounding inflammatory changes. Spleen: Normal in size without focal abnormality. Adrenals/Urinary Tract: Adrenal glands are unremarkable. Small native kidneys are seen with multiple bilateral simple renal cysts noted. The largest measures approximately 2.9 cm in diameter and is seen within the anteromedial aspect of the mid to upper right kidney. 4 mm and 6 mm calcifications are seen within the expected region of the left UPJ (best seen on coronal reformatted image 84, CT series 5). A similar appearing  1.4 cm calcification is noted along the expected region of the right UPJ (coronal reformatted image 80, CT series 5). The urinary bladder is empty and subsequently limited in evaluation. Stomach/Bowel: There is a small hiatal hernia. Appendix appears normal. No evidence of bowel wall thickening, distention, or inflammatory changes. Vascular/Lymphatic: Aortic atherosclerosis with marked severity calcification of the arterial structures of abdomen and pelvis. Bilateral common iliac artery stents are seen. With additional postoperative grafts noted within the left inguinal region. No enlarged abdominal or pelvic lymph nodes.  Reproductive: Uterus and bilateral adnexa are unremarkable. Other: Surgical mesh is noted along the anterior at aspect of the lower abdominal wall. A catheter is seen looped within the right lower quadrant. No abdominopelvic ascites. Musculoskeletal: Multilevel degenerative changes seen throughout the lumbar spine. IMPRESSION: 1. Cholelithiasis with a trace amount of pericholecystic inflammatory fat stranding. Further evaluation with a right upper quadrant ultrasound or nuclear medicine hepatobiliary scan is recommended, as early acute cholecystitis cannot be excluded. 2. Small native kidneys with multiple bilateral simple renal cysts. 3. Findings suspicious for bilateral proximal ureteral calculi, as described above. 4. Small hiatal hernia. 5. Catheter within the lower abdomen which may represent a fractured peritoneal dialysis catheter. 6. Aortic atherosclerosis. Aortic Atherosclerosis (ICD10-I70.0). Electronically Signed   By: Virgina Norfolk M.D.   On: 05/24/2021 01:33   US Abdomen Limited RUQ (LIVER/GB)  Result Date: 05/24/2021 CLINICAL DATA:  Cholelithiasis with concern for acute cholecystitis. EXAM: ULTRASOUND ABDOMEN LIMITED RIGHT UPPER QUADRANT COMPARISON:  Abdominal CT from earlier the same day FINDINGS: Gallbladder: Layering gallstones. No gallbladder wall thickening or focal tenderness. Common bile duct: Diameter: 4 mm Liver: No focal lesion identified. Within normal limits in parenchymal echogenicity. Portal vein is patent on color Doppler imaging with normal direction of blood flow towards the liver. IMPRESSION: Cholelithiasis.  No evidence of acute cholecystitis. Electronically Signed   By: Jorje Guild M.D.   On: 05/24/2021 05:09    Microbiology: Recent Results (from the past 240 hour(s))  Resp Panel by RT-PCR (Flu A&B, Covid) Nasopharyngeal Swab     Status: None   Collection Time: 05/23/21  5:01 AM   Specimen: Nasopharyngeal Swab; Nasopharyngeal(NP) swabs in vial transport medium   Result Value Ref Range Status   SARS Coronavirus 2 by RT PCR NEGATIVE NEGATIVE Final    Comment: (NOTE) SARS-CoV-2 target nucleic acids are NOT DETECTED.  The SARS-CoV-2 RNA is generally detectable in upper respiratory specimens during the acute phase of infection. The lowest concentration of SARS-CoV-2 viral copies this assay can detect is 138 copies/mL. A negative result does not preclude SARS-Cov-2 infection and should not be used as the sole basis for treatment or other patient management decisions. A negative result may occur with  improper specimen collection/handling, submission of specimen other than nasopharyngeal swab, presence of viral mutation(s) within the areas targeted by this assay, and inadequate number of viral copies(<138 copies/mL). A negative result must be combined with clinical observations, patient history, and epidemiological information. The expected result is Negative.  Fact Sheet for Patients:  EntrepreneurPulse.com.au  Fact Sheet for Healthcare Providers:  IncredibleEmployment.be  This test is no t yet approved or cleared by the [place] FDA and  has been authorized for detection and/or diagnosis of SARS-CoV-2 by FDA under an Emergency Use Authorization (EUA). This EUA will remain  in effect (meaning this test can be used) for the duration of the COVID-19 declaration under Section 564(b)(1) of the Act, 21 U.S.C.section 360bbb-3(b)(1), unless the authorization is terminated  or  revoked sooner.       Influenza A by PCR NEGATIVE NEGATIVE Final   Influenza B by PCR NEGATIVE NEGATIVE Final    Comment: (NOTE) The Xpert Xpress SARS-CoV-2/FLU/RSV plus assay is intended as an aid in the diagnosis of influenza from Nasopharyngeal swab specimens and should not be used as a sole basis for treatment. Nasal washings and aspirates are unacceptable for Xpert Xpress SARS-CoV-2/FLU/RSV testing.  Fact Sheet for  Patients: EntrepreneurPulse.com.au  Fact Sheet for Healthcare Providers: IncredibleEmployment.be  This test is not yet approved or cleared by the [place] FDA and has been authorized for detection and/or diagnosis of SARS-CoV-2 by FDA under an Emergency Use Authorization (EUA). This EUA will remain in effect (meaning this test can be used) for the duration of the COVID-19 declaration under Section 564(b)(1) of the Act, 21 U.S.C. section 360bbb-3(b)(1), unless the authorization is terminated or revoked.  Performed at [facility], [address] [place], [place] [address]      Labs: Basic Metabolic Panel: Recent Labs  Lab 05/23/21 0343 05/24/21 0528 05/26/21 0508  NA 138 135 138  K 4.2 3.8 4.6  CL 97* 97* 96*  CO2 30 28 30   GLUCOSE 210* 210* 204*  BUN 43* 36* 51*  CREATININE 9.64* 8.05* 8.86*  CALCIUM 9.0 8.8* 9.2  PHOS  --  2.5  --    Liver Function Tests: No results for input(s): AST, ALT, ALKPHOS, BILITOT, PROT, ALBUMIN in the last 168 hours. No results for input(s): LIPASE, AMYLASE in the last 168 hours. No results for input(s): AMMONIA in the last 168 hours. CBC: Recent Labs  Lab 05/23/21 0343 05/24/21 0528  WBC 8.7 8.0  NEUTROABS 5.1  --   HGB 11.9* 9.3*  HCT 36.6 27.8*  MCV 81.2 79.4*  PLT 226 198   Cardiac Enzymes: No results for input(s): CKTOTAL, CKMB, CKMBINDEX, TROPONINI in the last 168 hours. BNP: BNP (last 3 results) No results for input(s): BNP in the last 8760 hours.  ProBNP (last 3 results) No results for input(s): PROBNP in the last 8760 hours.  CBG: Recent Labs  Lab 05/25/21 0752 05/25/21 1617 05/25/21 2042 05/26/21 0744 05/26/21 1145  GLUCAP 369* 304* 193* 184* 164*       Signed:  Desma Maxim MD.  [facility] 05/26/2021, 12:41 PM

## 2021-05-26 NOTE — Op Note (Signed)
OPERATIVE NOTE    PRE-OPERATIVE DIAGNOSIS: 1. ESRD 2. Failing left thigh AVG  POST-OPERATIVE DIAGNOSIS: same as above  PROCEDURE: Ultrasound guidance for vascular access to the right femoral  vein Fluoroscopic guidance for placement of catheter Placement of a 33 cm tip to cuff tunneled hemodialysis catheter via the right femoral vein  SURGEON: [name], MD  ANESTHESIA:  Local with moderate conscious sedation for approximately 19 minutes using 2 mg of Versed and 50 mcg of Fentanyl  ESTIMATED BLOOD LOSS: 5 cc  FINDING(S): 1.  Patent right femoral vein   SPECIMEN(S):  None  INDICATIONS:   Patient is a 70 y.o.female who presents with renal failure and a failing left thigh AVG.  Her jugulars both appear occluded on [place].   The patient needs long term dialysis access for their ESRD, and a Permcath is necessary.  Risks and benefits are discussed and informed consent is obtained.    DESCRIPTION: After obtaining full informed written consent, the patient was brought back to the vascular suited. Moderate conscious sedation was administered throughout the procedure with a face-to-face encounter with my presence for the entire procedure and with my supervision of the RN monitoring the patient's vital signs, pulse oximetry, telemetry, and mental status throughout the procedure. The patient's right groin was sterilely prepped and draped and a sterile surgical field was created.  The right femoral vein was visualized with ultrasound and found to be patent. It was then accessed under direct ultrasound guidance and a permanent image was recorded. A wire was placed. After skin nick and dilatation, the peel-away sheath was placed over the wire. I then turned my attention to an area about 4-5 cm inferior and lateral to the access incision and a small counterincision was created.  I tunneled from the counter  incision to the access site. Using fluoroscopic guidance, a 33 centimeterer tip to cuff tunneled  hemodialysis catheter was selected, and tunneled from the counter  incision to the access site. It was then placed through the peel-away sheath and the peel-away sheath was removed. Using fluoroscopic guidance the catheter tips were parked in the IVC. The appropriate distal connectors were placed. It withdrew blood well and flushed easily with heparinized saline and a concentrated heparin solution was then placed. It was secured to the leg  with 2 Prolene sutures. The access incision was closed single 4-0 Monocryl. A 4-0 Monocryl pursestring suture was placed around the exit site. Sterile dressings were placed. The patient tolerated the procedure well and was taken to the recovery room in stable condition.  COMPLICATIONS: None  CONDITION: Stable    [name] 05/26/2021 9:53 AM   This note was created with Dragon Medical transcription system. Any errors in dictation are purely unintentional.

## 2021-05-26 NOTE — TOC Transition Note (Signed)
Transition of Care [facility]) - CM/SW Discharge Note   Patient Details  Name: Amy Wu MRN: [id number] Date of Birth: 09/15/1950  Transition of Care [facility]) CM/SW Contact:  Beverly Sessions, RN Phone Number: 05/26/2021, 1:46 PM   Clinical Narrative:     Patient to DC today Dr Ovid Curd with [facility] notified Significant other to transport HD coordinator notified of discharge   Final next level of care: Home/Self Care ([facility]) Barriers to Discharge: Barriers Resolved   Patient Goals and CMS Choice        Discharge Placement                       Discharge Plan and Services   Discharge Planning Services: CM Consult                                 Social Determinants of Health (SDOH) Interventions     Readmission Risk Interventions Readmission Risk Prevention Plan 05/26/2021 05/24/2021  Transportation Screening Complete Complete  PCP or Specialist Appt within 3-5 Days Complete Complete  HRI or [place] - Complete  Social Work Consult for [place] Planning/Counseling Complete -  Palliative Care Screening Not Applicable Not Applicable  Medication Review [employer] photographer) Complete Complete  Some recent data might be hidden

## 2021-05-27 DIAGNOSIS — T82838A Hemorrhage of vascular prosthetic devices, implants and grafts, initial encounter: Secondary | ICD-10-CM | POA: Insufficient documentation

## 2021-06-14 LAB — BLOOD GAS, ARTERIAL
Acid-Base Excess: 6.6 mmol/L — ABNORMAL HIGH (ref 0.0–2.0)
Bicarbonate: 31.3 mmol/L — ABNORMAL HIGH (ref 20.0–28.0)
FIO2: 0.21
O2 Saturation: 59 %
Patient temperature: 37
pCO2 arterial: 45 mmHg (ref 32.0–48.0)
pH, Arterial: 7.45 (ref 7.350–7.450)

## 2021-07-25 ENCOUNTER — Other Ambulatory Visit: Payer: Self-pay

## 2021-07-25 ENCOUNTER — Other Ambulatory Visit (INDEPENDENT_AMBULATORY_CARE_PROVIDER_SITE_OTHER): Payer: Self-pay | Admitting: Nurse Practitioner

## 2021-07-25 ENCOUNTER — Ambulatory Visit (INDEPENDENT_AMBULATORY_CARE_PROVIDER_SITE_OTHER): Payer: Medicare (Managed Care) | Admitting: Nurse Practitioner

## 2021-07-25 ENCOUNTER — Ambulatory Visit (INDEPENDENT_AMBULATORY_CARE_PROVIDER_SITE_OTHER): Payer: Medicare (Managed Care)

## 2021-07-25 ENCOUNTER — Encounter (INDEPENDENT_AMBULATORY_CARE_PROVIDER_SITE_OTHER): Payer: Self-pay

## 2021-07-25 ENCOUNTER — Encounter (INDEPENDENT_AMBULATORY_CARE_PROVIDER_SITE_OTHER): Payer: Self-pay | Admitting: Nurse Practitioner

## 2021-07-25 VITALS — BP 112/69 | HR 81 | Ht <= 58 in | Wt 193.0 lb

## 2021-07-25 DIAGNOSIS — N186 End stage renal disease: Secondary | ICD-10-CM

## 2021-07-25 DIAGNOSIS — Z992 Dependence on renal dialysis: Secondary | ICD-10-CM | POA: Diagnosis not present

## 2021-07-25 DIAGNOSIS — E785 Hyperlipidemia, unspecified: Secondary | ICD-10-CM | POA: Diagnosis not present

## 2021-07-25 DIAGNOSIS — E1122 Type 2 diabetes mellitus with diabetic chronic kidney disease: Secondary | ICD-10-CM

## 2021-07-25 DIAGNOSIS — Z794 Long term (current) use of insulin: Secondary | ICD-10-CM

## 2021-07-31 ENCOUNTER — Encounter (INDEPENDENT_AMBULATORY_CARE_PROVIDER_SITE_OTHER): Payer: Self-pay | Admitting: Nurse Practitioner

## 2021-07-31 NOTE — Progress Notes (Signed)
Subjective:    Patient ID: Amy Wu, female    DOB: 28-Sep-1950, 71 y.o.   MRN: [id number] Chief Complaint  Patient presents with   Follow-up    Conversation Only    Amy Wu is a 71 year old female that presents today for evaluation of permanent dialysis access.  The patient previously had a graft which failed and had subsequent heavy bleeding.  Because of that she had a right PermCath placed.  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  Since she has had the PermCath in her thighs she notes that it has been working well without any problems or issues.  She is currently very happy with it.   Review of Systems  Neurological:  Positive for weakness.  All other systems reviewed and are negative.     Objective:   Physical Exam Vitals reviewed.  HENT:     Head: Normocephalic.  Cardiovascular:     Rate and Rhythm: Normal rate.  Pulmonary:     Effort: Pulmonary effort is normal.  Skin:    General: Skin is warm and dry.  Neurological:     Mental Status: She is alert and oriented to person, place, and time.     Motor: Weakness present.     Gait: Gait abnormal.  Psychiatric:        Mood and Affect: Mood normal.        Behavior: Behavior normal.        Thought Content: Thought content normal.        Judgment: Judgment normal.    BP 112/69    Pulse 81    Ht 4\' 10"  (1.473 m)    Wt 193 lb (87.5 kg)    BMI 40.34 kg/m   Past Medical History:  Diagnosis Date   Arthritis    Chronic kidney disease    Diabetes mellitus without complication (HCC)    Hyperlipidemia    Hypertension    Peripheral vascular disease (HCC)     Social History   Socioeconomic History   Marital status: Single    Spouse name: Not on file   Number of children: Not on file   Years of education: Not on file   Highest education level: Not on file  Occupational History   Not on file  Tobacco Use   Smoking status: Never   Smokeless tobacco: Never  Vaping Use   Vaping Use: Never used   Substance and Sexual Activity   Alcohol use: No   Drug use: No   Sexual activity: Not on file  Other Topics Concern   Not on file  Social History Narrative   Not on file   Social Determinants of Health   Financial Resource Strain: Not on file  Food Insecurity: Not on file  Transportation Needs: Not on file  Physical Activity: Not on file  Stress: Not on file  Social Connections: Not on file  Intimate Partner Violence: Not on file    Past Surgical History:  Procedure Laterality Date   A/V SHUNT INTERVENTION Left 11/20/2019   Procedure: A/V SHUNT INTERVENTION (Left Thigh);  Surgeon: Algernon Huxley, MD;  Location: [place] CV [facility];  Service: Cardiovascular;  Laterality: Left;   A/V SHUNTOGRAM Left 02/16/2017   Procedure: A/V Shuntogram;  Surgeon: Algernon Huxley, MD;  Location: [place] [facility];  Service: Cardiovascular;  Laterality: Left;   A/V SHUNTOGRAM Left 12/13/2017   Procedure: A/V SHUNTOGRAM;  Surgeon: Algernon Huxley, MD;  Location: [facility]  INVASIVE CV LAB;  Service: Cardiovascular;  Laterality: Left;   A/V SHUNTOGRAM Left 12/05/2018   Procedure: A/V SHUNTOGRAM;  Surgeon: Algernon Huxley, MD;  Location: [place] [facility] LAB;  Service: Cardiovascular;  Laterality: Left;   A/V SHUNTOGRAM Left 06/03/2020   Procedure: A/V SHUNTOGRAM;  Surgeon: Algernon Huxley, MD;  Location: [place] CV LAB;  Service: Cardiovascular;  Laterality: Left;   A/V SHUNTOGRAM Left 05/24/2021   Procedure: A/V SHUNTOGRAM;  Surgeon: Katha Cabal, MD;  Location: [place] [facility];  Service: Cardiovascular;  Laterality: Left;  Thigh Graft   ABOVE KNEE LEG AMPUTATION     AV FISTULA PLACEMENT Bilateral    AV FISTULA PLACEMENT Left 07/01/2015   Procedure: INSERTION OF ARTERIOVENOUS (AV) GRAFT THIGH (ARTEGRAFT);  Surgeon: Algernon Huxley, MD;  Location: [facility];  Service: Vascular;  Laterality: Left;   BELOW KNEE LEG AMPUTATION Left    BREAST BIOPSY     DIALYSIS/PERMA CATHETER INSERTION N/A 05/26/2021    Procedure: DIALYSIS/PERMA CATHETER INSERTION;  Surgeon: Algernon Huxley, MD;  Location: [place] [facility];  Service: Cardiovascular;  Laterality: N/A;   ENDARTERECTOMY FEMORAL Left 07/01/2015   Procedure: SUPERFICIAL FEMORAL ENDARTERECTOMY ;  Surgeon: Algernon Huxley, MD;  Location: [facility];  Service: Vascular;  Laterality: Left;   EXCHANGE OF A DIALYSIS CATHETER  02/11/2015   Procedure: Exchange Of A Dialysis Catheter;  Surgeon: Algernon Huxley, MD;  Location: [place] [facility];  Service: Cardiovascular;;   LEG AMPUTATION ABOVE KNEE Right    PERIPHERAL VASCULAR CATHETERIZATION N/A 02/11/2015   Procedure: Dialysis/Perma Catheter Insertion;  Surgeon: Algernon Huxley, MD;  Location: [place] [facility];  Service: Cardiovascular;  Laterality: N/A;   PERIPHERAL VASCULAR CATHETERIZATION N/A 05/11/2015   Procedure: Dialysis/Perma Catheter Insertion, exchange;  Surgeon: Katha Cabal, MD;  Location: [place] [facility];  Service: Cardiovascular;  Laterality: N/A;   PERIPHERAL VASCULAR CATHETERIZATION N/A 05/18/2015   Procedure: Dialysis/Perma Catheter Insertion;  Surgeon: Katha Cabal, MD;  Location: [place] [facility];  Service: Cardiovascular;  Laterality: N/A;   PERIPHERAL VASCULAR CATHETERIZATION Left 07/19/2015   Procedure: A/V Shuntogram/Fistulagram;  Surgeon: Algernon Huxley, MD;  Location: [place] [facility];  Service: Cardiovascular;  Laterality: Left;   PERIPHERAL VASCULAR CATHETERIZATION N/A 07/19/2015   Procedure: A/V Shunt Intervention;  Surgeon: Algernon Huxley, MD;  Location: [place] [facility];  Service: Cardiovascular;  Laterality: N/A;   PERIPHERAL VASCULAR CATHETERIZATION N/A 08/26/2015   Procedure: Dialysis/Perma Catheter Removal;  Surgeon: Algernon Huxley, MD;  Location: [place] [facility];  Service: Cardiovascular;  Laterality: N/A;   PERIPHERAL VASCULAR THROMBECTOMY N/A 12/29/2016   Procedure: Peripheral Vascular Thrombectomy;  Surgeon: Katha Cabal, MD;  Location: [place] [facility]  LAB;  Service: Cardiovascular;  Laterality: N/A;    Family History  Problem Relation Age of Onset   Breast cancer Neg Hx     Allergies  Allergen Reactions   Ancef [Cefazolin] Palpitations   Contrast Media [Iodinated Contrast Media] Palpitations    CBC Latest Ref Rng & Units 05/24/2021 05/23/2021 11/19/2019  WBC 4.0 - 10.5 K/uL 8.0 8.7 6.8  Hemoglobin 12.0 - 15.0 g/dL 9.3(L) 11.9(L) 10.7(L)  Hematocrit 36.0 - 46.0 % 27.8(L) 36.6 32.0(L)  Platelets 150 - 400 K/uL 198 226 202      CMP     Component Value Date/Time   NA 138 05/26/2021 0508   NA 138 12/03/2012 1414   K 4.6 05/26/2021 0508   K  4.5 12/03/2012 1414   CL 96 (L) 05/26/2021 0508   CL 101 12/03/2012 1414   CO2 30 05/26/2021 0508   CO2 27 12/03/2012 1414   GLUCOSE 204 (H) 05/26/2021 0508   GLUCOSE 326 (H) 12/03/2012 1414   BUN 51 (H) 05/26/2021 0508   BUN 37 (H) 12/03/2012 1414   CREATININE 8.86 (H) 05/26/2021 0508   CREATININE 7.04 (H) 12/03/2012 1414   CALCIUM 9.2 05/26/2021 0508   CALCIUM 8.4 (L) 12/03/2012 1414   PROT 6.9 12/28/2011 1746   ALBUMIN 2.7 (L) 12/28/2011 1746   AST 12 (L) 12/28/2011 1746   ALT 7 (L) 12/28/2011 1746   ALKPHOS 154 (H) 12/28/2011 1746   BILITOT 0.3 12/28/2011 1746   GFRNONAA 4 (L) 05/26/2021 0508   GFRNONAA 6 (L) 12/03/2012 1414   GFRAA 9 (L) 11/19/2019 1002   GFRAA 7 (L) 12/03/2012 1414     No results found.     Assessment & Plan:   1. ESRD on dialysis [facility]) The patient currently is maintained via PermCath and she does not wish for any other access at this time.  She notes that her PermCath is working well and does not wish to move forward with any discussions about any other thigh graft.  At this time we will leave the patient catheter dependent per her wishes.  2. Hyperlipidemia, unspecified hyperlipidemia type Continue statin as ordered and reviewed, no changes at this time   3. Type 2 diabetes mellitus with chronic kidney disease on chronic dialysis, with long-term  current use of insulin (HCC) Continue hypoglycemic medications as already ordered, these medications have been reviewed and there are no changes at this time.  Hgb A1C to be monitored as already arranged by primary service    Current Outpatient Medications on File Prior to Visit  Medication Sig Dispense Refill   acetaminophen (TYLENOL) 500 MG tablet Take 1,000 mg by mouth every 8 (eight) hours as needed for moderate pain.     aspirin EC 81 MG tablet Take 81 mg by mouth daily.     atorvastatin (LIPITOR) 40 MG tablet Take 40 mg by mouth daily.     benzonatate (TESSALON) 100 MG capsule Take by mouth 3 (three) times daily as needed for cough.     calcitRIOL (ROCALTROL) 0.25 MCG capsule Take 0.25 mcg by mouth daily. Take 3 capsules by mouth three times week at dialysis     camphor-menthol [facility]) lotion Apply 1 application topically as needed for itching. Twice a day if needed     cinacalcet (SENSIPAR) 30 MG tablet Take 30 mg by mouth daily with supper.      COMFORT EZ PEN NEEDLES 31G X 6 MM MISC Inject into the skin.     Dextromethorphan-guaiFENesin (MUCINEX DM) 30-600 MG TB12 Take 1 tablet by mouth daily.     ferrous sulfate 325 (65 FE) MG tablet Take 325 mg by mouth every other day.     fluticasone (FLONASE) 50 MCG/ACT nasal spray Place 1 spray into both nostrils daily.     gabapentin (NEURONTIN) 100 MG capsule Take 100 mg by mouth 3 (three) times daily. M/W/F after dialysis     glucose 4 GM chewable tablet Chew 1 tablet by mouth as needed for low blood sugar. If blood sugar less than 70. Chew 3 tablets and recheck blood sugar in 15 minutes     glucose blood test strip 1 each by Other route as needed for other. Use as instructed     insulin  aspart (NOVOLOG FLEXPEN) 100 UNIT/ML FlexPen Inject into the skin.     JANUVIA 25 MG tablet Take 25 mg by mouth daily.     ketotifen (ZADITOR) 0.025 % ophthalmic solution Place 1 drop into both eyes 2 (two) times daily.     lanthanum (FOSRENOL) 1000 MG  chewable tablet Chew 1,000 mg by mouth 2 (two) times daily with a meal.      LANTUS 100 UNIT/ML injection Inject 7 Units into the skin every evening.     loperamide (IMODIUM A-D) 2 MG tablet Take 2 mg by mouth 4 (four) times daily as needed for diarrhea or loose stools. 2 tablets by mouth for onset of loose stool, than 1 tablet by mouth after each loose stool MAX 4 tablets in 24 hours     loratadine (CLARITIN) 10 MG tablet Take 10 mg by mouth daily as needed for allergies.     Methoxy PEG-Epoetin Beta (MIRCERA IJ) Mircera     NYSTATIN powder SMARTSIG:Sparingly Topical Daily PRN     polyethylene glycol (MIRALAX / GLYCOLAX) packet Take 17 g by mouth daily as needed for moderate constipation or severe constipation.     RA VITAMINS A & D EX Apply topically as needed.     ReliOn Lancet Devices 30G MISC Apply topically.     rOPINIRole (REQUIP) 0.25 MG tablet Take 0.25 mg by mouth at bedtime as needed.     senna-docusate (SENOKOT-S) 8.6-50 MG tablet Take 1-2 tablets by mouth at bedtime as needed for mild constipation or moderate constipation. 1-2 tablets at bedtime if needed     Simethicone 180 MG CAPS Take 1 capsule by mouth daily.     Skin Protectants, Misc. (EUCERIN) cream Apply 1 application topically 4 (four) times daily as needed for dry skin.      TRUEplus Lancets 28G MISC Apply topically.     ULTICARE INSULIN SYRINGE 31G X 5/16" 0.3 ML MISC SMARTSIG:Injection Daily     Vitamins A & D (VITAMIN A & D) ointment Apply topically daily as needed.     Zinc Oxide 13 % CREA Apply 1 application topically as needed for irritation.     No current facility-administered medications on file prior to visit.    There are no Patient Instructions on file for this visit. No follow-ups on file.   Kris Hartmann, NP

## 2021-09-06 ENCOUNTER — Ambulatory Visit
Admission: RE | Admit: 2021-09-06 | Discharge: 2021-09-06 | Disposition: A | Discharge status: Home / Self Care | Payer: Medicare (Managed Care) | Source: Ambulatory Visit | Attending: Family Medicine | Admitting: Family Medicine

## 2021-09-06 ENCOUNTER — Other Ambulatory Visit: Payer: Self-pay

## 2021-09-06 DIAGNOSIS — Z1231 Encounter for screening mammogram for malignant neoplasm of breast: Secondary | ICD-10-CM | POA: Insufficient documentation

## 2022-06-30 ENCOUNTER — Encounter: Payer: Self-pay | Admitting: Ophthalmology

## 2022-06-30 NOTE — Progress Notes (Signed)
Bilat amputee

## 2022-07-04 ENCOUNTER — Telehealth (INDEPENDENT_AMBULATORY_CARE_PROVIDER_SITE_OTHER): Payer: Self-pay

## 2022-07-04 NOTE — Telephone Encounter (Signed)
Spoke with Kyia at [facility] to schedule the patient for a permcath exchange. Patient is scheduled with Dr. Lucky Cowboy on 07/05/22 with a 10:00 [facility]. Pre-procedure instructions were discussed and will be faxed to attention Kyia at [facility] to give to the patient as she is there at this time.

## 2022-07-05 ENCOUNTER — Encounter: Payer: Self-pay | Admitting: Vascular Surgery

## 2022-07-05 ENCOUNTER — Ambulatory Visit
Admission: RE | Admit: 2022-07-05 | Discharge: 2022-07-05 | Disposition: A | Discharge status: Home / Self Care | Payer: Medicare (Managed Care) | Attending: Vascular Surgery | Admitting: Vascular Surgery

## 2022-07-05 ENCOUNTER — Encounter
Admission: RE | Disposition: A | Discharge status: Home / Self Care | Payer: Self-pay | Source: Home / Self Care | Attending: Vascular Surgery

## 2022-07-05 ENCOUNTER — Other Ambulatory Visit: Payer: Self-pay

## 2022-07-05 DIAGNOSIS — I2510 Atherosclerotic heart disease of native coronary artery without angina pectoris: Secondary | ICD-10-CM | POA: Diagnosis not present

## 2022-07-05 DIAGNOSIS — N186 End stage renal disease: Secondary | ICD-10-CM | POA: Insufficient documentation

## 2022-07-05 DIAGNOSIS — T82868A Thrombosis of vascular prosthetic devices, implants and grafts, initial encounter: Secondary | ICD-10-CM

## 2022-07-05 DIAGNOSIS — Y841 Kidney dialysis as the cause of abnormal reaction of the patient, or of later complication, without mention of misadventure at the time of the procedure: Secondary | ICD-10-CM | POA: Insufficient documentation

## 2022-07-05 DIAGNOSIS — E1122 Type 2 diabetes mellitus with diabetic chronic kidney disease: Secondary | ICD-10-CM | POA: Insufficient documentation

## 2022-07-05 DIAGNOSIS — I120 Hypertensive chronic kidney disease with stage 5 chronic kidney disease or end stage renal disease: Secondary | ICD-10-CM | POA: Insufficient documentation

## 2022-07-05 DIAGNOSIS — Z992 Dependence on renal dialysis: Secondary | ICD-10-CM | POA: Insufficient documentation

## 2022-07-05 DIAGNOSIS — T8249XA Other complication of vascular dialysis catheter, initial encounter: Secondary | ICD-10-CM | POA: Insufficient documentation

## 2022-07-05 HISTORY — PX: DIALYSIS/PERMA CATHETER INSERTION: CATH118288

## 2022-07-05 LAB — GLUCOSE, CAPILLARY
Glucose-Capillary: 61 mg/dL — ABNORMAL LOW (ref 70–99)
Glucose-Capillary: 97 mg/dL (ref 70–99)
Glucose-Capillary: 132 mg/dL — ABNORMAL HIGH (ref 70–99)

## 2022-07-05 LAB — POTASSIUM (ARMC VASCULAR LAB ONLY): Potassium (ARMC vascular lab): 3.3 mmol/L — ABNORMAL LOW (ref 3.5–5.1)

## 2022-07-05 SURGERY — DIALYSIS/PERMA CATHETER INSERTION
Anesthesia: Moderate Sedation

## 2022-07-05 MED ORDER — MIDAZOLAM HCL 2 MG/2ML IJ SOLN
INTRAMUSCULAR | Status: DC | PRN
  Administered 2022-07-05: 1 mg via INTRAVENOUS

## 2022-07-05 MED ORDER — MIDAZOLAM HCL 2 MG/ML PO SYRP: 8.0000 mg | ORAL_SOLUTION | Freq: Once | ORAL | Status: DC | PRN

## 2022-07-05 MED ORDER — DIPHENHYDRAMINE HCL 50 MG/ML IJ SOLN: 50.0000 mg | Freq: Once | INTRAMUSCULAR | Status: DC | PRN

## 2022-07-05 MED ORDER — HYDROMORPHONE HCL 1 MG/ML IJ SOLN: 1.0000 mg | Freq: Once | INTRAMUSCULAR | Status: DC | PRN

## 2022-07-05 MED ORDER — FENTANYL CITRATE (PF) 100 MCG/2ML IJ SOLN
INTRAMUSCULAR | Status: DC | PRN
  Administered 2022-07-05: 50 ug via INTRAVENOUS

## 2022-07-05 MED ORDER — MIDAZOLAM HCL 2 MG/2ML IJ SOLN
INTRAMUSCULAR | Status: AC
  Filled 2022-07-05: qty 2

## 2022-07-05 MED ORDER — DEXTROSE 50 % IV SOLN
INTRAVENOUS | Status: AC
  Administered 2022-07-05: 12.5 g via INTRAVENOUS
  Filled 2022-07-05: qty 50

## 2022-07-05 MED ORDER — METHYLPREDNISOLONE SODIUM SUCC 125 MG IJ SOLR: 125.0000 mg | Freq: Once | INTRAMUSCULAR | Status: DC | PRN

## 2022-07-05 MED ORDER — VANCOMYCIN HCL IN DEXTROSE 1-5 GM/200ML-% IV SOLN
INTRAVENOUS | Status: AC
  Administered 2022-07-05: 1000 mg via INTRAVENOUS
  Filled 2022-07-05: qty 200

## 2022-07-05 MED ORDER — VANCOMYCIN HCL IN DEXTROSE 1-5 GM/200ML-% IV SOLN: 1000.0000 mg | INTRAVENOUS | Status: AC

## 2022-07-05 MED ORDER — DEXTROSE 50 % IV SOLN: 12.5000 g | Freq: Once | INTRAVENOUS | Status: AC

## 2022-07-05 MED ORDER — ONDANSETRON HCL 4 MG/2ML IJ SOLN: 4.0000 mg | Freq: Four times a day (QID) | INTRAMUSCULAR | Status: DC | PRN

## 2022-07-05 MED ORDER — FENTANYL CITRATE (PF) 100 MCG/2ML IJ SOLN
INTRAMUSCULAR | Status: AC
  Filled 2022-07-05: qty 2

## 2022-07-05 MED ORDER — SODIUM CHLORIDE 0.9 % IV SOLN: INTRAVENOUS | Status: DC

## 2022-07-05 MED ORDER — FAMOTIDINE 20 MG PO TABS: 40.0000 mg | ORAL_TABLET | Freq: Once | ORAL | Status: DC | PRN

## 2022-07-05 SURGICAL SUPPLY — 6 items
CATH PALINDROME-P 44CM KIT (CATHETERS) ×1
GUIDEWIRE SUPER STIFF .035X180 (WIRE) IMPLANT
KIT CATH CHRNC PALINDROME PRCS (CATHETERS) IMPLANT
PACK ANGIOGRAPHY (CUSTOM PROCEDURE TRAY) IMPLANT
SUT MNCRL AB 4-0 PS2 18 (SUTURE) IMPLANT
SUT PROLENE 0 CT 1 30 (SUTURE) IMPLANT

## 2022-07-05 NOTE — H&P (Signed)
[facility] Admission History & Physical  MRN : [id number]  Amy Wu is a 72 y.o. (1950-09-24) female who presents with chief complaint of No chief complaint on file. Marland Kitchen  History of Present Illness:  I am asked to evaluate the patient by the dialysis center. The patient was sent here because they were unable to achieve adequate dialysis yesterday. Furthermore the Center states they were unable to aspirate either lumen of the catheter yesterday.  This problem has been getting worse for about 1 week. The patient is unaware of any other change.   Patient denies pain or tenderness overlying the access.  There is no pain with dialysis.  Patient denies fevers or shaking chills while on dialysis.    There have multiple any past interventions and declots of his multiple different access.  The patient is not chronically hypotensive on dialysis.   Current Facility-Administered Medications  Medication Dose Route Frequency Provider Last Rate Last Admin   fentaNYL (SUBLIMAZE) 100 MCG/2ML injection            midazolam (VERSED) 2 MG/2ML injection            0.9 %  sodium chloride infusion   Intravenous Continuous Kris Hartmann, NP 10 mL/hr at 07/05/22 1133 Rate Change at 07/05/22 1133   diphenhydrAMINE (BENADRYL) injection 50 mg  50 mg Intravenous Once PRN Kris Hartmann, NP       famotidine (PEPCID) tablet 40 mg  40 mg Oral Once PRN Kris Hartmann, NP       HYDROmorphone (DILAUDID) injection 1 mg  1 mg Intravenous Once PRN Kris Hartmann, NP       methylPREDNISolone sodium succinate (SOLU-MEDROL) 125 mg/2 mL injection 125 mg  125 mg Intravenous Once PRN Kris Hartmann, NP       midazolam (VERSED) 2 MG/ML syrup 8 mg  8 mg Oral Once PRN Kris Hartmann, NP       ondansetron [facility]) injection 4 mg  4 mg Intravenous Q6H PRN Kris Hartmann, NP       vancomycin (VANCOCIN) IVPB 1000 mg/200 mL premix  1,000 mg Intravenous 60 min Pre-Op Eulogio Ditch E, NP 200 mL/hr at  07/05/22 1200 1,000 mg at 07/05/22 1200     Past Surgical History:  Procedure Laterality Date   A/V SHUNT INTERVENTION Left 11/20/2019   Procedure: A/V SHUNT INTERVENTION (Left Thigh);  Surgeon: Algernon Huxley, MD;  Location: [place] [facility];  Service: Cardiovascular;  Laterality: Left;   A/V SHUNTOGRAM Left 02/16/2017   Procedure: A/V Shuntogram;  Surgeon: Algernon Huxley, MD;  Location: [place] [facility];  Service: Cardiovascular;  Laterality: Left;   A/V SHUNTOGRAM Left 12/13/2017   Procedure: A/V SHUNTOGRAM;  Surgeon: Algernon Huxley, MD;  Location: [place] [facility];  Service: Cardiovascular;  Laterality: Left;   A/V SHUNTOGRAM Left 12/05/2018   Procedure: A/V SHUNTOGRAM;  Surgeon: Algernon Huxley, MD;  Location: [place] [facility];  Service: Cardiovascular;  Laterality: Left;   A/V SHUNTOGRAM Left 06/03/2020   Procedure: A/V SHUNTOGRAM;  Surgeon: Algernon Huxley, MD;  Location: [place] [facility];  Service: Cardiovascular;  Laterality: Left;   A/V SHUNTOGRAM Left 05/24/2021   Procedure: A/V SHUNTOGRAM;  Surgeon: Katha Cabal, MD;  Location: [place] [facility];  Service: Cardiovascular;  Laterality: Left;  Thigh Graft   ABOVE KNEE LEG AMPUTATION     AV FISTULA PLACEMENT Bilateral    AV FISTULA  PLACEMENT Left 07/01/2015   Procedure: INSERTION OF ARTERIOVENOUS (AV) GRAFT THIGH (ARTEGRAFT);  Surgeon: Algernon Huxley, MD;  Location: [facility];  Service: Vascular;  Laterality: Left;   BELOW KNEE LEG AMPUTATION Left    BREAST BIOPSY     DIALYSIS/PERMA CATHETER INSERTION N/A 05/26/2021   Procedure: DIALYSIS/PERMA CATHETER INSERTION;  Surgeon: Algernon Huxley, MD;  Location: [place] [facility];  Service: Cardiovascular;  Laterality: N/A;   ENDARTERECTOMY FEMORAL Left 07/01/2015   Procedure: SUPERFICIAL FEMORAL ENDARTERECTOMY ;  Surgeon: Algernon Huxley, MD;  Location: [facility];  Service: Vascular;  Laterality: Left;   EXCHANGE OF A DIALYSIS CATHETER  02/11/2015   Procedure: Exchange Of A Dialysis  Catheter;  Surgeon: Algernon Huxley, MD;  Location: [place] [facility];  Service: Cardiovascular;;   LEG AMPUTATION ABOVE KNEE Right    PERIPHERAL VASCULAR CATHETERIZATION N/A 02/11/2015   Procedure: Dialysis/Perma Catheter Insertion;  Surgeon: Algernon Huxley, MD;  Location: [place] [facility];  Service: Cardiovascular;  Laterality: N/A;   PERIPHERAL VASCULAR CATHETERIZATION N/A 05/11/2015   Procedure: Dialysis/Perma Catheter Insertion, exchange;  Surgeon: Katha Cabal, MD;  Location: [place] [facility];  Service: Cardiovascular;  Laterality: N/A;   PERIPHERAL VASCULAR CATHETERIZATION N/A 05/18/2015   Procedure: Dialysis/Perma Catheter Insertion;  Surgeon: Katha Cabal, MD;  Location: [place] [facility];  Service: Cardiovascular;  Laterality: N/A;   PERIPHERAL VASCULAR CATHETERIZATION Left 07/19/2015   Procedure: A/V Shuntogram/Fistulagram;  Surgeon: Algernon Huxley, MD;  Location: [place] [facility];  Service: Cardiovascular;  Laterality: Left;   PERIPHERAL VASCULAR CATHETERIZATION N/A 07/19/2015   Procedure: A/V Shunt Intervention;  Surgeon: Algernon Huxley, MD;  Location: [place] [facility];  Service: Cardiovascular;  Laterality: N/A;   PERIPHERAL VASCULAR CATHETERIZATION N/A 08/26/2015   Procedure: Dialysis/Perma Catheter Removal;  Surgeon: Algernon Huxley, MD;  Location: [place] [facility];  Service: Cardiovascular;  Laterality: N/A;   PERIPHERAL VASCULAR THROMBECTOMY N/A 12/29/2016   Procedure: Peripheral Vascular Thrombectomy;  Surgeon: Katha Cabal, MD;  Location: [place] [facility];  Service: Cardiovascular;  Laterality: N/A;     Social History   Tobacco Use   Smoking status: Never   Smokeless tobacco: Never  Vaping Use   Vaping Use: Never used  Substance Use Topics   Alcohol use: No   Drug use: No    Family History  Problem Relation Age of Onset   Breast cancer Neg Hx     No family history of bleeding or clotting disorders, autoimmune disease or  porphyria  Allergies  Allergen Reactions   Ancef [Cefazolin] Palpitations   Contrast Media [Iodinated Contrast Media] Palpitations     REVIEW OF SYSTEMS (Negative unless checked)  Constitutional: [] Weight loss  [] Fever  [] Chills Cardiac: [] Chest pain   [] Chest pressure   [] Palpitations   [] Shortness of breath when laying flat   [] Shortness of breath at rest   [x] Shortness of breath with exertion. Vascular:  [] Pain in legs with walking   [] Pain in legs at rest   [] Pain in legs when laying flat   [] Claudication   [] Pain in feet when walking  [] Pain in feet at rest  [] Pain in feet when laying flat   [] History of DVT   [] Phlebitis   [] Swelling in legs   [] Varicose veins   [] Non-healing ulcers Pulmonary:   [] Uses home oxygen   [] Productive cough   [] Hemoptysis   [] Wheeze  [] COPD   [] Asthma Neurologic:  [] Dizziness  [] Blackouts   []   Seizures   [] History of stroke   [] History of TIA  [] Aphasia   [] Temporary blindness   [] Dysphagia   [] Weakness or numbness in arms   [] Weakness or numbness in legs Musculoskeletal:  [x] Arthritis   [] Joint swelling   [x] Joint pain   [] Low back pain Hematologic:  [] Easy bruising  [] Easy bleeding   [] Hypercoagulable state   [x] Anemic  [] Hepatitis Gastrointestinal:  [] Blood in stool   [] Vomiting blood  [] Gastroesophageal reflux/heartburn   [] Difficulty swallowing. Genitourinary:  [x] Chronic kidney disease   [] Difficult urination  [] Frequent urination  [] Burning with urination   [] Blood in urine Skin:  [] Rashes   [] Ulcers   [] Wounds Psychological:  [] History of anxiety   []  History of major depression.  Physical Examination  Vitals:   07/05/22 1125 07/05/22 1209  BP: 117/62   Pulse: 73   Resp: 18   Temp: 97.7 F (36.5 C)   TempSrc: Oral   SpO2: 98% 99%  Weight: 95.3 kg   Height: 4' 10.5" (1.486 m)    Body mass index is 43.14 kg/m. Gen: WD/WN, NAD Head: [place]/AT, No temporalis wasting.  Ear/Nose/Throat: Hearing grossly intact, nares w/o erythema or drainage,  oropharynx w/o Erythema/Exudate,  Eyes: Conjunctiva clear, sclera non-icteric Neck: Trachea midline.  No JVD.  Pulmonary:  Good air movement, respirations not labored, no use of accessory muscles.  Cardiac: RRR, normal S1, S2. Vascular: right femoral tunneled catheter without tenderness or drainage Vessel Right Left  Radial Palpable Palpable  Gastrointestinal: soft, non-tender/non-distended. No guarding/reflex.  Musculoskeletal: M/S 5/5 throughout.  Extremities without ischemic changes.  No deformity or atrophy.  Neurologic: Sensation grossly intact in extremities.  Symmetrical.  Speech is fluent. Motor exam as listed above. Psychiatric: Judgment intact, Mood & affect appropriate for pt's clinical situation. Dermatologic: No rashes or ulcers noted.  No cellulitis or open wounds.    CBC Lab Results  Component Value Date   WBC 8.0 05/24/2021   HGB 9.3 (L) 05/24/2021   HCT 27.8 (L) 05/24/2021   MCV 79.4 (L) 05/24/2021   PLT 198 05/24/2021    BMET    Component Value Date/Time   NA 138 05/26/2021 0508   NA 138 12/03/2012 1414   K 4.6 05/26/2021 0508   K 4.5 12/03/2012 1414   CL 96 (L) 05/26/2021 0508   CL 101 12/03/2012 1414   CO2 30 05/26/2021 0508   CO2 27 12/03/2012 1414   GLUCOSE 204 (H) 05/26/2021 0508   GLUCOSE 326 (H) 12/03/2012 1414   BUN 51 (H) 05/26/2021 0508   BUN 37 (H) 12/03/2012 1414   CREATININE 8.86 (H) 05/26/2021 0508   CREATININE 7.04 (H) 12/03/2012 1414   CALCIUM 9.2 05/26/2021 0508   CALCIUM 8.4 (L) 12/03/2012 1414   GFRNONAA 4 (L) 05/26/2021 0508   GFRNONAA 6 (L) 12/03/2012 1414   GFRAA 9 (L) 11/19/2019 1002   GFRAA 7 (L) 12/03/2012 1414   CrCl cannot be calculated (Patient's most recent lab result is older than the maximum 21 days allowed.).  COAG Lab Results  Component Value Date   INR 1.1 05/23/2021   INR 1.0 11/19/2019   INR 1.11 06/22/2015    Radiology No results found.  Assessment/Plan 1.  Complication dialysis device with  thrombosis AV access:  Patient's right femoral tunneled catheter is thrombosed. The patient will undergo exchange of the catheter same venous access using interventional techniques.  The risks and benefits were described to the patient.  All questions were answered.  The patient agrees to proceed with intervention.  2.  End-stage renal disease requiring hemodialysis:  Patient will continue dialysis therapy without further interruption if a successful exchange is not achieved then new site will be found for tunneled catheter placement. Dialysis has already been arranged since the patient missed their previous session 3.  Hypertension:  Patient will continue medical management; nephrology is following no changes in oral medications. 4. Diabetes mellitus:  Glucose will be monitored and oral medications been held this morning once the patient has undergone the patient's procedure po intake will be reinitiated and again Accu-Cheks will be used to assess the blood glucose level and treat as needed. The patient will be restarted on the patient's usual hypoglycemic regime 5.  Coronary artery disease:  EKG will be monitored. Nitrates will be used if needed. The patient's oral cardiac medications will be continued.    [name], MD  07/05/2022 12:16 PM

## 2022-07-05 NOTE — Op Note (Signed)
OPERATIVE NOTE    PRE-OPERATIVE DIAGNOSIS: 1. ESRD 2. Non-functional permcath  POST-OPERATIVE DIAGNOSIS: same as above  PROCEDURE: Fluoroscopic guidance for placement of catheter Placement of a 44 cm tip to cuff tunneled hemodialysis catheter via the right femoral vein and removal of previous catheter  SURGEON: [name], MD  ANESTHESIA:  Local with moderate conscious sedation for 15 minutes using 1 mg of Versed and 50 mcg of Fentanyl  ESTIMATED BLOOD LOSS: 5 cc  FINDING(S): none  SPECIMEN(S):  None  INDICATIONS:   Patient is a 72 y.o.female who presents with non-functional dialysis catheter and ESRD.  The patient needs long term dialysis access for their ESRD, and a Permcath is necessary.  Risks and benefits are discussed and informed consent is obtained.    DESCRIPTION: After obtaining full informed written consent, the patient was brought back to the vascular suite. The patient received moderate conscious sedation during a face-to-face encounter with me present throughout the entire procedure and supervising the RN monitoring the vital signs, pulse oximetry, telemetry, and mental status throughout the entire procedure. The patient's existing catheter, right groin and thigh were sterilely prepped and draped in a sterile surgical field was created.  The existing catheter was dissected free from the fibrous sheath securing the cuff with hemostats and blunt dissection.  A wire was placed. The existing catheter was then removed and the wire used to keep venous access. I selected a 44 cm tip to cuff tunneled dialysis catheter.  Using fluoroscopic guidance the catheter tips were parked in the right atrium. The appropriate distal connectors were placed. It withdrew blood well and flushed easily with heparinized saline and a concentrated heparin solution was then placed. It was secured to the chest wall with 2 Prolene sutures. A 4-0 Monocryl pursestring suture was placed around the exit site.  Sterile dressings were placed. The patient tolerated the procedure well and was taken to the recovery room in stable condition.  COMPLICATIONS: None  CONDITION: Stable  [name] 07/05/2022 12:37 PM   This note was created with Dragon Medical transcription system. Any errors in dictation are purely unintentional.

## 2022-07-05 NOTE — Discharge Instructions (Signed)
Tunneled Catheter Insertion, Care After The following information offers guidance on how to care for yourself after your procedure. Your health care provider may also give you more specific instructions. If you have problems or questions, contact your health care provider. What can I expect after the procedure? After the procedure, it is common to have: Some mild redness, bruising, swelling, and pain around your catheter site. A small amount of blood or clear fluid coming from your incisions. Follow these instructions at home: Medicines Take over-the-counter and prescription medicines only as told by your health care provider. If you were prescribed an antibiotic medicine, take it as told by your health care provider. Do not stop taking the antibiotic even if you start to feel better. Incision care  Follow instructions from your health care provider about how to take care of your incisions. Make sure you: Your dialysis center will perform all needed dressing changes.  Leave stitches (sutures), skin glue, or adhesive strips in place. These skin closures may need to stay in place for 2 weeks or longer. Keep your dressings clean and dry. Check your incision areas every day for signs of infection. Check for: More redness, swelling, or pain. More fluid or blood. Warmth. Pus or a bad smell. Catheter care  Keep your catheter site clean and dry. Do not shower, sponge bath only while catheter in place.  Your dialysis center will Flush your catheter per the dialysis center protocol.  This helps prevent it from becoming clogged. Leave thecaps on the ends of the catheter when not in use. Do not pull on your catheter. Activity Return to your normal activities as told by your health care provider. Ask your health care provider what activities are safe for you. Follow any other activity restrictions as instructed by your health care provider. Do not lift anything that is heavier than 10 lb (4.5 kg), or  the limit that you are told, until your health care provider says that it is safe. Driving Do not drive for 24 hours.  General instructions Follow your health care provider's specific instructions for the type of catheter that you have. Do not take baths, swim, or use a hot tub while your catheter is in place. Keep all follow-up visits. This is important to prevent infection.  Contact a health care provider if: You feel unusually weak or nauseous. You have a fever or chills. You have more redness, swelling, or pain at your incisions or around the area where your catheter has been inserted or where it exits. You have pus or a bad smell coming from your catheter site. Your catheter site feels warm to the touch. Your catheter is not working properly. Fluid is leaking from the catheter, under the dressing, or around the dressing. Your dialysis center is unable to flush your catheter. Get help right away if: Your catheter develops a hole or it breaks. Your catheter comes loose or gets pulled completely out. If this happens, press on your catheter site firmly with a clean cloth until you can get medical help. You develop bleeding from your catheter or your insertion site, and your bleeding does not stop. You have swelling in your shoulder, neck, chest, or face. You have pain or swelling when fluids or medicines are being given through the catheter. You have chest pain or difficulty breathing. These symptoms may represent a serious problem that is an emergency. Do not wait to see if the symptoms will go away. Get medical help right away. Call your  local emergency services (911 in the [place] Do not drive yourself to the hospital. Summary After the procedure, it is common to have mild redness, swelling, and pain around your catheter site. Return to your normal activities as told by your health care provider. Ask your health care provider what activities are safe for you. Follow your health care  provider's specific instructions for the type of catheter that you have. Keep your catheter site and your dressings clean and dry. Contact a health care provider if your catheter is not working properly. Get help right away if you have chest pain, difficulty breathing, or your catheter comes loose or gets pulled completely out. This information is not intended to replace advice given to you by your health care provider. Make sure you discuss any questions you have with your health care provider. Document Revised: 10/11/2020 Document Reviewed: 10/11/2020 Elsevier Patient Education  2023 Elsevier Inc.     

## 2022-07-06 ENCOUNTER — Encounter: Payer: Self-pay | Admitting: Vascular Surgery

## 2022-07-07 NOTE — Discharge Instructions (Signed)
   Cataract Surgery, Care After ? ?This sheet gives you information about how to care for yourself after your surgery.  Your ophthalmologist may also give you more specific instructions.  If you have problems or questions, contact your doctor at [facility], [phone]. ? ?What can I expect after the surgery? ?It is common to have: ?Itching ?Foreign body sensation (feels like a grain of sand in the eye) ?Watery discharge (excess tearing) ?Sensitivity to light and touch ?Bruising in or around the eye ?Mild blurred vision ? ?Follow these instructions at home: ?Do not touch or rub your eyes. ?You may be told to wear a protective shield or sunglasses to protect your eyes. ?Do not put a contact lens in the operative eye unless your doctor approves. ?Keep the lids and face clean and dry. ?Do not allow water to hit you directly in the face while showering. ?Keep soap and shampoo out of your eyes. ?Do not use eye makeup for 1 week. ? ?Check your eye every day for signs of infection.  Watch for: ?Redness, swelling, or pain. ?Fluid, blood or pus. ?Worsening vision. ?Worsening sensitivity to light or touch. ? ?Activity: ?During the first day, avoid bending over and reading.  You may resume reading and bending the next day. ?Do not drive or use heavy machinery for at least 24 hours. ?Avoid strenuous activities for 1 week.  Activities such as walking, treadmill, exercise bike, and climbing stairs are okay. ?Do not lift heavy (>20 pound) objects for 1 week. ?Do not do yardwork, gardening, or dirty housework (mopping, cleaning bathrooms, vacuuming, etc.) for 1 week. ?Do not swim or use a hot tub for 2 weeks. ?Ask your doctor when you can return to work. ? ?General Instructions: ?Take or apply prescription and over-the-counter medicines as directed by your doctor, including eyedrops and ointments. ?Resume medications discontinued prior to surgery, unless told otherwise by your doctor. ?Keep all follow up appointments as  scheduled. ? ?Contact a health care provider if: ?You have increased bruising around your eye. ?You have pain that is not helped with medication. ?You have a fever. ?You have fluid, pus, or blood coming from your eye or incision. ?Your sensitivity to light gets worse. ?You have spots (floaters) of flashing lights in your vision. ?You have nausea or vomiting. ? ?Go to the nearest emergency room or call 911 if: ?You have sudden loss of vision. ?You have severe, worsening eye pain. ? ?

## 2022-07-10 ENCOUNTER — Ambulatory Visit: Payer: Medicare (Managed Care) | Admitting: Anesthesiology

## 2022-07-10 ENCOUNTER — Encounter
Admission: RE | Disposition: A | Discharge status: Home / Self Care | Payer: Self-pay | Source: Home / Self Care | Attending: Ophthalmology

## 2022-07-10 ENCOUNTER — Other Ambulatory Visit
Admission: RE | Admit: 2022-07-10 | Discharge: 2022-07-10 | Disposition: A | Discharge status: Home / Self Care | Payer: Medicare (Managed Care) | Attending: Urgent Care | Admitting: Urgent Care

## 2022-07-10 ENCOUNTER — Ambulatory Visit
Admission: RE | Admit: 2022-07-10 | Discharge: 2022-07-10 | Disposition: A | Discharge status: Home / Self Care | Payer: Medicare (Managed Care) | Attending: Ophthalmology | Admitting: Ophthalmology

## 2022-07-10 ENCOUNTER — Other Ambulatory Visit: Payer: Self-pay

## 2022-07-10 DIAGNOSIS — H2512 Age-related nuclear cataract, left eye: Secondary | ICD-10-CM | POA: Insufficient documentation

## 2022-07-10 DIAGNOSIS — E1136 Type 2 diabetes mellitus with diabetic cataract: Secondary | ICD-10-CM | POA: Diagnosis not present

## 2022-07-10 DIAGNOSIS — N186 End stage renal disease: Secondary | ICD-10-CM | POA: Diagnosis not present

## 2022-07-10 DIAGNOSIS — E1122 Type 2 diabetes mellitus with diabetic chronic kidney disease: Secondary | ICD-10-CM | POA: Diagnosis not present

## 2022-07-10 DIAGNOSIS — I252 Old myocardial infarction: Secondary | ICD-10-CM | POA: Diagnosis not present

## 2022-07-10 DIAGNOSIS — Z01812 Encounter for preprocedural laboratory examination: Secondary | ICD-10-CM

## 2022-07-10 DIAGNOSIS — Z794 Long term (current) use of insulin: Secondary | ICD-10-CM | POA: Diagnosis not present

## 2022-07-10 DIAGNOSIS — E119 Type 2 diabetes mellitus without complications: Secondary | ICD-10-CM

## 2022-07-10 HISTORY — DX: Other complications of anesthesia, initial encounter: T88.59XA

## 2022-07-10 HISTORY — PX: CATARACT EXTRACTION W/PHACO: SHX586

## 2022-07-10 LAB — GLUCOSE, CAPILLARY
Glucose-Capillary: 63 mg/dL — ABNORMAL LOW (ref 70–99)
Glucose-Capillary: 115 mg/dL — ABNORMAL HIGH (ref 70–99)

## 2022-07-10 LAB — POTASSIUM: Potassium: 3.7 mmol/L (ref 3.5–5.1)

## 2022-07-10 SURGERY — PHACOEMULSIFICATION, CATARACT, WITH IOL INSERTION
Anesthesia: Monitor Anesthesia Care | Site: Eye | Laterality: Left

## 2022-07-10 MED ORDER — SIGHTPATH DOSE#1 NA HYALUR & NA CHOND-NA HYALUR IO KIT
PACK | INTRAOCULAR | Status: DC | PRN
  Administered 2022-07-10: 1 via OPHTHALMIC

## 2022-07-10 MED ORDER — LACTATED RINGERS IV SOLN: INTRAVENOUS | Status: DC

## 2022-07-10 MED ORDER — DEXTROSE 50 % IV SOLN
1.0000 | Freq: Once | INTRAVENOUS | Status: AC
  Administered 2022-07-10: 50 mL via INTRAVENOUS

## 2022-07-10 MED ORDER — ACETAMINOPHEN 325 MG PO TABS: 650.0000 mg | ORAL_TABLET | Freq: Once | ORAL | Status: DC | PRN

## 2022-07-10 MED ORDER — MIDAZOLAM HCL 2 MG/2ML IJ SOLN
INTRAMUSCULAR | Status: DC | PRN
  Administered 2022-07-10: 1 mg via INTRAVENOUS

## 2022-07-10 MED ORDER — ARMC OPHTHALMIC DILATING DROPS
1.0000 | OPHTHALMIC | Status: DC | PRN
  Administered 2022-07-10 (×3): 1 via OPHTHALMIC

## 2022-07-10 MED ORDER — SIGHTPATH DOSE#1 BSS IO SOLN
INTRAOCULAR | Status: DC | PRN
  Administered 2022-07-10: 113 mL via OPHTHALMIC

## 2022-07-10 MED ORDER — LIDOCAINE HCL (PF) 2 % IJ SOLN
INTRAOCULAR | Status: DC | PRN
  Administered 2022-07-10: 1 mL via INTRAOCULAR

## 2022-07-10 MED ORDER — ACETAMINOPHEN 160 MG/5ML PO SOLN: 325.0000 mg | ORAL | Status: DC | PRN

## 2022-07-10 MED ORDER — SODIUM CHLORIDE 0.9% FLUSH
INTRAVENOUS | Status: DC | PRN
  Administered 2022-07-10: 3 mL via INTRAVENOUS

## 2022-07-10 MED ORDER — TETRACAINE HCL 0.5 % OP SOLN
1.0000 [drp] | OPHTHALMIC | Status: DC | PRN
  Administered 2022-07-10 (×3): 1 [drp] via OPHTHALMIC

## 2022-07-10 MED ORDER — MOXIFLOXACIN HCL 0.5 % OP SOLN
OPHTHALMIC | Status: DC | PRN
  Administered 2022-07-10: .2 mL via OPHTHALMIC

## 2022-07-10 MED ORDER — SIGHTPATH DOSE#1 BSS IO SOLN
INTRAOCULAR | Status: DC | PRN
  Administered 2022-07-10: 15 mL

## 2022-07-10 MED ORDER — ONDANSETRON HCL 4 MG/2ML IJ SOLN: 4.0000 mg | Freq: Once | INTRAMUSCULAR | Status: DC | PRN

## 2022-07-10 SURGICAL SUPPLY — 14 items
CATARACT SUITE SIGHTPATH (MISCELLANEOUS) ×1 IMPLANT
DISSECTOR HYDRO NUCLEUS 50X22 (MISCELLANEOUS) ×1 IMPLANT
FEE CATARACT SUITE SIGHTPATH (MISCELLANEOUS) ×1 IMPLANT
GLOVE SURG GAMMEX PI TX LF 7.5 (GLOVE) ×1 IMPLANT
GLOVE SURG SYN 8.5  E (GLOVE) ×1
GLOVE SURG SYN 8.5 E (GLOVE) ×1 IMPLANT
GLOVE SURG SYN 8.5 PF PI (GLOVE) ×1 IMPLANT
LENS IOL ACRSF IQ ULTRA 19.0 (Intraocular Lens) IMPLANT
LENS IOL ACRYSOF IQ 19.0 (Intraocular Lens) ×1 IMPLANT
NDL FILTER BLUNT 18X1 1/2 (NEEDLE) ×1 IMPLANT
NEEDLE FILTER BLUNT 18X1 1/2 (NEEDLE) ×1 IMPLANT
SYR 3ML LL SCALE MARK (SYRINGE) ×1 IMPLANT
SYR 5ML LL (SYRINGE) ×1 IMPLANT
WATER STERILE IRR 250ML POUR (IV SOLUTION) ×1 IMPLANT

## 2022-07-10 NOTE — Anesthesia Postprocedure Evaluation (Signed)
Anesthesia Post Note  Patient: Amy Wu  Procedure(s) Performed: CATARACT EXTRACTION PHACO AND INTRAOCULAR LENS PLACEMENT (IOC) LEFT DIABETIC  12.98  01:13.7 (Left: Eye)  Patient location during evaluation: PACU Anesthesia Type: MAC Level of consciousness: awake and alert, oriented and patient cooperative Pain management: pain level controlled Vital Signs Assessment: post-procedure vital signs reviewed and stable Respiratory status: spontaneous breathing, nonlabored ventilation and respiratory function stable Cardiovascular status: blood pressure returned to baseline and stable Postop Assessment: adequate PO intake Anesthetic complications: no Comments: Patient not able to move from wheelchair to stretcher and back independently very well -- would not recommend her for surgery at [facility] in the future since we do not have lifts and/or moving help at this facility.   No notable events documented.   Last Vitals:  Vitals:   07/10/22 1136 07/10/22 1143  BP:    Pulse: 84   Resp: 18   Temp: (!) 36.4 C (!) 36.4 C  SpO2: 96%     Last Pain:  Vitals:   07/10/22 1143  TempSrc:   PainSc: 0-No pain                 Darrin Nipper

## 2022-07-10 NOTE — Op Note (Signed)
OPERATIVE NOTE  Amy Wu [id number] 07/10/2022   PREOPERATIVE DIAGNOSIS:  Nuclear sclerotic cataract left eye.  H25.12   POSTOPERATIVE DIAGNOSIS:    Nuclear sclerotic cataract left eye.     PROCEDURE:  Phacoemusification with posterior chamber intraocular lens placement of the left eye   LENS:   Implant Name Type Inv. Item Serial No. Manufacturer Lot No. LRB No. Used Action  LENS IOL ACRYSOF IQ 19.0 - [id number] Intraocular Lens LENS IOL ACRYSOF IQ 19.0 [id number] [employer]  Left 1 Implanted      Procedure(s): CATARACT EXTRACTION PHACO AND INTRAOCULAR LENS PLACEMENT (IOC) LEFT DIABETIC  12.98  01:13.7 (Left)  [id number] +19.0   ULTRASOUND TIME: 1 minutes 13 seconds.  CDE 12.98   SURGEON:  Benay Pillow, MD, MPH   ANESTHESIA:  Topical with tetracaine drops augmented with 1% preservative-free intracameral lidocaine.  ESTIMATED BLOOD LOSS: <1 mL   COMPLICATIONS:  None.   DESCRIPTION OF PROCEDURE:  The patient was identified in the holding room and transported to the operating room and placed in the supine position under the operating microscope.  The left eye was identified as the operative eye and it was prepped and draped in the usual sterile ophthalmic fashion.   A 1.0 millimeter clear-corneal paracentesis was made at the 5:00 position. 0.5 ml of preservative-free 1% lidocaine with epinephrine was injected into the anterior chamber.  The anterior chamber was filled with viscoelastic.  A 2.4 millimeter keratome was used to make a near-clear corneal incision at the 2:00 position.  A curvilinear capsulorrhexis was made with a cystotome and capsulorrhexis forceps.  Balanced salt solution was used to hydrodissect and hydrodelineate the nucleus.   Phacoemulsification was then used in stop and chop fashion to remove the lens nucleus and epinucleus.  The remaining cortex was then removed using the irrigation and aspiration handpiece. Viscoelastic was then placed into the capsular bag to  distend it for lens placement.  A lens was then injected into the capsular bag.  The remaining viscoelastic was aspirated.   Wounds were hydrated with balanced salt solution.  The anterior chamber was inflated to a physiologic pressure with balanced salt solution.  Intracameral vigamox 0.1 mL undiltued was injected into the eye and a drop placed onto the ocular surface.  No wound leaks were noted.  The patient was taken to the recovery room in stable condition without complications of anesthesia or surgery  Benay Pillow 07/10/2022, 11:33 AM

## 2022-07-10 NOTE — Transfer of Care (Signed)
Immediate Anesthesia Transfer of Care Note  Patient: Amy Wu  Procedure(s) Performed: CATARACT EXTRACTION PHACO AND INTRAOCULAR LENS PLACEMENT (IOC) LEFT DIABETIC  12.98  01:13.7 (Left: Eye)  Patient Location: PACU  Anesthesia Type: MAC  Level of Consciousness: awake, alert  and patient cooperative  Airway and Oxygen Therapy: Patient Spontanous Breathing and Patient connected to supplemental oxygen  Post-op Assessment: Post-op Vital signs reviewed, Patient's Cardiovascular Status Stable, Respiratory Function Stable, Patent Airway and No signs of Nausea or vomiting  Post-op Vital Signs: Reviewed and stable  Complications: No notable events documented.

## 2022-07-10 NOTE — H&P (Signed)
[facility]   Primary Care Physician:  Lajean Manes, MD Ophthalmologist: Dr. Benay Pillow  Pre-Procedure History & Physical: HPI:  Amy Wu is a 72 y.o. female here for cataract surgery.   Past Medical History:  Diagnosis Date   Arthritis    Chronic kidney disease    Complication of anesthesia    Diabetes mellitus without complication ([place])    Hyperlipidemia    Peripheral vascular disease ([place])     Past Surgical History:  Procedure Laterality Date   A/V SHUNT INTERVENTION Left 11/20/2019   Procedure: A/V SHUNT INTERVENTION (Left Thigh);  Surgeon: Algernon Huxley, MD;  Location: [facility];  Service: Cardiovascular;  Laterality: Left;   A/V SHUNTOGRAM Left 02/16/2017   Procedure: A/V Shuntogram;  Surgeon: Algernon Huxley, MD;  Location: [facility];  Service: Cardiovascular;  Laterality: Left;   A/V SHUNTOGRAM Left 12/13/2017   Procedure: A/V SHUNTOGRAM;  Surgeon: Algernon Huxley, MD;  Location: [place] [facility];  Service: Cardiovascular;  Laterality: Left;   A/V SHUNTOGRAM Left 12/05/2018   Procedure: A/V SHUNTOGRAM;  Surgeon: Algernon Huxley, MD;  Location: [place] [facility];  Service: Cardiovascular;  Laterality: Left;   A/V SHUNTOGRAM Left 06/03/2020   Procedure: A/V SHUNTOGRAM;  Surgeon: Algernon Huxley, MD;  Location: [facility];  Service: Cardiovascular;  Laterality: Left;   A/V SHUNTOGRAM Left 05/24/2021   Procedure: A/V SHUNTOGRAM;  Surgeon: Katha Cabal, MD;  Location: [facility] [place] [facility];  Service: Cardiovascular;  Laterality: Left;  Thigh Graft   ABOVE KNEE LEG AMPUTATION     AV FISTULA PLACEMENT Bilateral    AV FISTULA PLACEMENT Left 07/01/2015   Procedure: INSERTION OF ARTERIOVENOUS (AV) GRAFT THIGH (ARTEGRAFT);  Surgeon: Algernon Huxley, MD;  Location: [facility];  Service: Vascular;  Laterality: Left;   BELOW KNEE LEG AMPUTATION Left    BREAST BIOPSY     DIALYSIS/PERMA CATHETER INSERTION N/A 05/26/2021   Procedure: DIALYSIS/PERMA  CATHETER INSERTION;  Surgeon: Algernon Huxley, MD;  Location: [facility];  Service: Cardiovascular;  Laterality: N/A;   DIALYSIS/PERMA CATHETER INSERTION N/A 07/05/2022   Procedure: DIALYSIS/PERMA CATHETER INSERTION;  Surgeon: Algernon Huxley, MD;  Location: [place] [facility];  Service: Cardiovascular;  Laterality: N/A;   ENDARTERECTOMY FEMORAL Left 07/01/2015   Procedure: SUPERFICIAL FEMORAL ENDARTERECTOMY ;  Surgeon: Algernon Huxley, MD;  Location: [facility];  Service: Vascular;  Laterality: Left;   EXCHANGE OF A DIALYSIS CATHETER  02/11/2015   Procedure: Exchange Of A Dialysis Catheter;  Surgeon: Algernon Huxley, MD;  Location: [place] [facility];  Service: Cardiovascular;;   LEG AMPUTATION ABOVE KNEE Right    PERIPHERAL VASCULAR CATHETERIZATION N/A 02/11/2015   Procedure: Dialysis/Perma Catheter Insertion;  Surgeon: Algernon Huxley, MD;  Location: [place] [facility];  Service: Cardiovascular;  Laterality: N/A;   PERIPHERAL VASCULAR CATHETERIZATION N/A 05/11/2015   Procedure: Dialysis/Perma Catheter Insertion, exchange;  Surgeon: Katha Cabal, MD;  Location: [place] [facility];  Service: Cardiovascular;  Laterality: N/A;   PERIPHERAL VASCULAR CATHETERIZATION N/A 05/18/2015   Procedure: Dialysis/Perma Catheter Insertion;  Surgeon: Katha Cabal, MD;  Location: [place];  Service: Cardiovascular;  Laterality: N/A;   PERIPHERAL VASCULAR CATHETERIZATION Left 07/19/2015   Procedure: A/V Shuntogram/Fistulagram;  Surgeon: Algernon Huxley, MD;  Location: [place];  Service: Cardiovascular;  Laterality: Left;   PERIPHERAL VASCULAR CATHETERIZATION N/A 07/19/2015   Procedure: A/V Shunt Intervention;  Surgeon: Algernon Huxley, MD;  Location: [facility];  Service: Cardiovascular;  Laterality: N/A;   PERIPHERAL VASCULAR CATHETERIZATION N/A 08/26/2015   Procedure: Dialysis/Perma Catheter Removal;  Surgeon: Algernon Huxley, MD;  Location: [facility];  Service: Cardiovascular;   Laterality: N/A;   PERIPHERAL VASCULAR THROMBECTOMY N/A 12/29/2016   Procedure: Peripheral Vascular Thrombectomy;  Surgeon: Katha Cabal, MD;  Location: [facility];  Service: Cardiovascular;  Laterality: N/A;    Prior to Admission medications   Medication Sig Start Date End Date Taking? Authorizing Provider  acetaminophen (TYLENOL) 500 MG tablet Take 1,000 mg by mouth every 8 (eight) hours as needed for moderate pain.   Yes [provider]  aspirin EC 81 MG tablet Take 81 mg by mouth daily. 11/11/20  Yes [provider]  atorvastatin (LIPITOR) 40 MG tablet Take 40 mg by mouth daily.   Yes [provider]  calcitRIOL (ROCALTROL) 0.25 MCG capsule Take 0.25 mcg by mouth daily. Take 3 capsules by mouth three times week at dialysis   Yes [provider]  cinacalcet (SENSIPAR) 30 MG tablet Take 30 mg by mouth daily with supper.    Yes [provider]  COMFORT EZ PEN NEEDLES 31G X 6 MM MISC Inject into the skin. 03/17/21  Yes [provider]  gabapentin (NEURONTIN) 100 MG capsule Take 100 mg by mouth 3 (three) times daily. M/W/F after dialysis   Yes [provider]  glucose blood test strip 1 each by Other route as needed for other. Use as instructed   Yes [provider]  JANUVIA 25 MG tablet Take 25 mg by mouth daily. 11/02/20  Yes [provider]  LANTUS 100 UNIT/ML injection Inject 6 Units into the skin as needed (if sugar is over 200). 04/05/21  Yes [provider]  loperamide (IMODIUM A-D) 2 MG tablet Take 2 mg by mouth 4 (four) times daily as needed for diarrhea or loose stools. 2 tablets by mouth for onset of loose stool, than 1 tablet by mouth after each loose stool MAX 4 tablets in 24 hours   Yes [provider]  senna-docusate (SENOKOT-S) 8.6-50 MG tablet Take 1-2 tablets by mouth at bedtime as needed for mild constipation or moderate constipation. 1-2 tablets at bedtime if needed    Yes [provider]  TRUEplus Lancets 28G MISC Apply topically. 03/17/21  Yes [provider]  [place] X 5/16" 0.3 ML MISC SMARTSIG:Injection Daily 03/31/21  Yes [provider]  benzonatate (TESSALON) 100 MG capsule Take by mouth 3 (three) times daily as needed for cough. Patient not taking: Reported on 06/30/2022    [provider]  camphor-menthol [facility]) lotion Apply 1 application topically as needed for itching. Twice a day if needed Patient not taking: Reported on 06/30/2022    [provider]  Dextromethorphan-guaiFENesin (MUCINEX DM) 30-600 MG TB12 Take 1 tablet by mouth daily. Patient not taking: Reported on 06/30/2022 04/30/17   [provider]  ferrous sulfate 325 (65 FE) MG tablet Take 325 mg by mouth every other day. Patient not taking: Reported on 06/30/2022    [provider]  fluticasone (FLONASE) 50 MCG/ACT nasal spray Place 1 spray into both nostrils daily. Patient not taking: Reported on 06/30/2022    [provider]  glucose 4 GM chewable tablet Chew 1 tablet by mouth as needed for low blood sugar. If blood sugar less than 70. Chew 3 tablets and recheck  blood sugar in 15 minutes Patient not taking: Reported on 06/30/2022    [provider]  insulin aspart (NOVOLOG FLEXPEN) 100 UNIT/ML FlexPen Inject into the skin. Patient not taking: Reported on 06/30/2022 07/10/17   [provider]  ketotifen (ZADITOR) 0.025 % ophthalmic solution Place 1 drop into both eyes 2 (two) times daily. Patient not taking: Reported on 06/30/2022    [provider]  lanthanum (FOSRENOL) 1000 MG chewable tablet Chew 1,000 mg by mouth 2 (two) times daily with a meal.     [provider]  loratadine (CLARITIN) 10 MG tablet Take 10 mg by mouth daily as needed for allergies. Patient not taking: Reported on 06/30/2022    [provider]  NYSTATIN powder SMARTSIG:Sparingly Topical  Daily PRN Patient not taking: Reported on 06/30/2022 11/18/20   [provider]  polyethylene glycol (MIRALAX / GLYCOLAX) packet Take 17 g by mouth daily as needed for moderate constipation or severe constipation. Patient not taking: Reported on 06/30/2022    [provider]  RA VITAMINS A & D EX Apply topically as needed. Patient not taking: Reported on 06/30/2022    [provider]  ReliOn Lancet Devices 30G MISC Apply topically. Patient not taking: Reported on 06/30/2022 03/17/21   [provider]  rOPINIRole (REQUIP) 0.25 MG tablet Take 0.25 mg by mouth at bedtime as needed. Patient not taking: Reported on 06/30/2022    [provider]  Simethicone 180 MG CAPS Take 1 capsule by mouth daily. Patient not taking: Reported on 06/30/2022    [provider]  Skin Protectants, Misc. (EUCERIN) cream Apply 1 application topically 4 (four) times daily as needed for dry skin.  Patient not taking: Reported on 06/30/2022    [provider]  Vitamins A & D (VITAMIN A & D) ointment Apply topically daily as needed. Patient not taking: Reported on 06/30/2022 08/31/20   [provider]  Zinc Oxide 13 % CREA Apply 1 application topically as needed for irritation. Patient not taking: Reported on 06/30/2022    [provider]    Allergies as of 06/20/2022 - Review Complete 07/31/2021  Allergen Reaction Noted   Ancef [cefazolin] Palpitations 02/16/2017   Contrast media [iodinated contrast media] Palpitations 02/16/2017    Family History  Problem Relation Age of Onset   Breast cancer Neg Hx     Social History   Socioeconomic History   Marital status: Single    Spouse name: Not on file   Number of children: Not on file   Years of education: Not on file   Highest education level: Not on file  Occupational History   Not on file  Tobacco Use   Smoking status: Never   Smokeless tobacco: Never  Vaping Use   Vaping Use: Never used   Substance and Sexual Activity   Alcohol use: No   Drug use: No   Sexual activity: Not on file  Other Topics Concern   Not on file  Social History Narrative   Not on file   Social Determinants of Health   Financial Resource Strain: Not on file  Food Insecurity: Not on file  Transportation Needs: Not on file  Physical Activity: Not on file  Stress: Not on file  Social Connections: Not on file  Intimate Partner Violence: Not on file    Review of Systems: See HPI, otherwise negative ROS  Physical Exam: BP 128/64   Pulse 86   Temp (!) 97.1 F (36.2 C) (Temporal)  Resp (!) 22   Wt 95 kg   SpO2 100%   BMI 43.03 kg/m  General:   Alert, cooperative in NAD Head:  Normocephalic and atraumatic. Respiratory:  Normal work of breathing. Cardiovascular:  RRR  Impression/Plan: Amy Wu is here for cataract surgery.  Risks, benefits, limitations, and alternatives regarding cataract surgery have been reviewed with the patient.  Questions have been answered.  All parties agreeable.   Benay Pillow, MD  07/10/2022, 11:03 AM

## 2022-07-10 NOTE — Anesthesia Preprocedure Evaluation (Addendum)
Anesthesia Evaluation  Patient identified by MRN, date of birth, ID band Patient awake    Reviewed: Allergy & Precautions, NPO status , Patient's Chart, lab work & pertinent test results  History of Anesthesia Complications Negative for: history of anesthetic complications  Airway Mallampati: IV   Neck ROM: Full    Dental  (+) Missing, Partial Lower, Partial Upper   Pulmonary neg pulmonary ROS   Pulmonary exam normal breath sounds clear to auscultation       Cardiovascular + Past MI and + Peripheral Vascular Disease (s/p bilateral BKAs)  Normal cardiovascular exam Rhythm:Regular Rate:Normal     Neuro/Psych negative neurological ROS     GI/Hepatic negative GI ROS,,,  Endo/Other  diabetes, Type 2  Class 3 obesity  Renal/GU ESRF and DialysisRenal disease (last HD 07/10/22)     Musculoskeletal   Abdominal   Peds  Hematology negative hematology ROS (+)   Anesthesia Other Findings   Reproductive/Obstetrics                             Anesthesia Physical Anesthesia Plan  ASA: 3  Anesthesia Plan: MAC   Post-op Pain Management:    Induction: Intravenous  PONV Risk Score and Plan: 2 and Treatment may vary due to age or medical condition, Midazolam and TIVA  Airway Management Planned: Natural Airway and Nasal Cannula  Additional Equipment:   Intra-op Plan:   Post-operative Plan:   Informed Consent: I have reviewed the patients History and Physical, chart, labs and discussed the procedure including the risks, benefits and alternatives for the proposed anesthesia with the patient or authorized representative who has indicated his/her understanding and acceptance.     Dental advisory given  Plan Discussed with: CRNA  Anesthesia Plan Comments: (LMA/GETA backup discussed.  Patient consented for risks of anesthesia including but not limited to:  - adverse reactions to medications -  damage to eyes, teeth, lips or other oral mucosa - nerve damage due to positioning  - sore throat or hoarseness - damage to heart, brain, nerves, lungs, other parts of body or loss of life  Informed patient about role of CRNA in peri- and intra-operative care.  Patient voiced understanding.)        Anesthesia Quick Evaluation

## 2022-07-12 ENCOUNTER — Encounter: Payer: Self-pay | Admitting: Ophthalmology

## 2022-07-28 ENCOUNTER — Ambulatory Visit
Admission: AD | Admit: 2022-07-28 | Discharge: 2022-07-28 | Disposition: A | Discharge status: Home / Self Care | Payer: Medicare (Managed Care) | Attending: Vascular Surgery | Admitting: Vascular Surgery

## 2022-07-28 ENCOUNTER — Other Ambulatory Visit: Payer: Self-pay

## 2022-07-28 ENCOUNTER — Encounter: Payer: Self-pay | Admitting: Vascular Surgery

## 2022-07-28 ENCOUNTER — Encounter
Admission: AD | Disposition: A | Discharge status: Home / Self Care | Payer: Self-pay | Source: Home / Self Care | Attending: Vascular Surgery

## 2022-07-28 ENCOUNTER — Telehealth (INDEPENDENT_AMBULATORY_CARE_PROVIDER_SITE_OTHER): Payer: Self-pay

## 2022-07-28 DIAGNOSIS — T8249XA Other complication of vascular dialysis catheter, initial encounter: Secondary | ICD-10-CM | POA: Insufficient documentation

## 2022-07-28 DIAGNOSIS — N186 End stage renal disease: Secondary | ICD-10-CM | POA: Diagnosis not present

## 2022-07-28 DIAGNOSIS — Y841 Kidney dialysis as the cause of abnormal reaction of the patient, or of later complication, without mention of misadventure at the time of the procedure: Secondary | ICD-10-CM | POA: Diagnosis not present

## 2022-07-28 DIAGNOSIS — Z992 Dependence on renal dialysis: Secondary | ICD-10-CM | POA: Diagnosis not present

## 2022-07-28 DIAGNOSIS — T8241XA Breakdown (mechanical) of vascular dialysis catheter, initial encounter: Secondary | ICD-10-CM | POA: Diagnosis not present

## 2022-07-28 HISTORY — PX: DIALYSIS/PERMA CATHETER INSERTION: CATH118288

## 2022-07-28 LAB — POTASSIUM (ARMC VASCULAR LAB ONLY): Potassium (ARMC vascular lab): 4.1 mmol/L (ref 3.5–5.1)

## 2022-07-28 LAB — GLUCOSE, CAPILLARY
Glucose-Capillary: 128 mg/dL — ABNORMAL HIGH (ref 70–99)
Glucose-Capillary: 43 mg/dL — CL (ref 70–99)

## 2022-07-28 SURGERY — DIALYSIS/PERMA CATHETER INSERTION
Anesthesia: Moderate Sedation

## 2022-07-28 MED ORDER — CHLORHEXIDINE GLUCONATE CLOTH 2 % EX PADS
6.0000 | MEDICATED_PAD | Freq: Once | CUTANEOUS | Status: AC
  Administered 2022-07-28: 6 via TOPICAL

## 2022-07-28 MED ORDER — MIDAZOLAM HCL 2 MG/ML PO SYRP: 8.0000 mg | ORAL_SOLUTION | Freq: Once | ORAL | Status: DC | PRN

## 2022-07-28 MED ORDER — DIPHENHYDRAMINE HCL 50 MG/ML IJ SOLN: 50.0000 mg | Freq: Once | INTRAMUSCULAR | Status: DC | PRN

## 2022-07-28 MED ORDER — SODIUM CHLORIDE 0.9 % IV SOLN: INTRAVENOUS | Status: DC

## 2022-07-28 MED ORDER — FENTANYL CITRATE (PF) 100 MCG/2ML IJ SOLN
INTRAMUSCULAR | Status: DC | PRN
  Administered 2022-07-28: 50 ug via INTRAVENOUS

## 2022-07-28 MED ORDER — METHYLPREDNISOLONE SODIUM SUCC 125 MG IJ SOLR: 125.0000 mg | Freq: Once | INTRAMUSCULAR | Status: DC | PRN

## 2022-07-28 MED ORDER — MIDAZOLAM HCL 2 MG/2ML IJ SOLN
INTRAMUSCULAR | Status: AC
  Filled 2022-07-28: qty 2

## 2022-07-28 MED ORDER — FENTANYL CITRATE (PF) 100 MCG/2ML IJ SOLN: 12.5000 ug | Freq: Once | INTRAMUSCULAR | Status: DC | PRN

## 2022-07-28 MED ORDER — HYDROMORPHONE HCL 1 MG/ML IJ SOLN: 1.0000 mg | Freq: Once | INTRAMUSCULAR | Status: DC | PRN

## 2022-07-28 MED ORDER — MIDAZOLAM HCL 2 MG/2ML IJ SOLN
INTRAMUSCULAR | Status: DC | PRN
  Administered 2022-07-28: 1 mg via INTRAVENOUS

## 2022-07-28 MED ORDER — FAMOTIDINE 20 MG PO TABS: 40.0000 mg | ORAL_TABLET | Freq: Once | ORAL | Status: DC | PRN

## 2022-07-28 MED ORDER — DEXTROSE 50 % IV SOLN
1.0000 | Freq: Once | INTRAVENOUS | Status: AC
  Administered 2022-07-28: 50 mL via INTRAVENOUS

## 2022-07-28 MED ORDER — CIPROFLOXACIN IN D5W 200 MG/100ML IV SOLN
200.0000 mg | INTRAVENOUS | Status: AC
  Administered 2022-07-28: 200 mg via INTRAVENOUS
  Filled 2022-07-28: qty 100

## 2022-07-28 MED ORDER — ONDANSETRON HCL 4 MG/2ML IJ SOLN: 4.0000 mg | Freq: Four times a day (QID) | INTRAMUSCULAR | Status: DC | PRN

## 2022-07-28 MED ORDER — DEXTROSE 50 % IV SOLN
INTRAVENOUS | Status: AC
  Filled 2022-07-28: qty 50

## 2022-07-28 MED ORDER — FENTANYL CITRATE PF 50 MCG/ML IJ SOSY
PREFILLED_SYRINGE | INTRAMUSCULAR | Status: AC
  Filled 2022-07-28: qty 1

## 2022-07-28 SURGICAL SUPPLY — 6 items
CATH PALINDROME-P 44CM KIT (CATHETERS) ×1
GUIDEWIRE SUPER STIFF .035X180 (WIRE) IMPLANT
KIT CATH CHRNC PALINDROME PRCS (CATHETERS) IMPLANT
PACK ANGIOGRAPHY (CUSTOM PROCEDURE TRAY) IMPLANT
SUT MNCRL AB 4-0 PS2 18 (SUTURE) IMPLANT
SUT PROLENE 0 CT 1 30 (SUTURE) IMPLANT

## 2022-07-28 NOTE — Progress Notes (Signed)
Attempted to route post op note to [facility] on [address] where pt states she gets dialysis but location is not in the system to export.

## 2022-07-28 NOTE — Telephone Encounter (Signed)
I received a fax on the patient for an ASAP permcath exchange. Patient called and she is scheduled for today for [facility]. Patient has been informed as well. I contacted [facility] and received a prior auth as well.

## 2022-07-28 NOTE — Op Note (Signed)
OPERATIVE NOTE    PRE-OPERATIVE DIAGNOSIS: 1. ESRD 2. Non-functional permcath  POST-OPERATIVE DIAGNOSIS: same as above  PROCEDURE: Fluoroscopic guidance for placement of catheter Placement of a 44 cm tip to cuff tunneled hemodialysis catheter via the right femoral vein and removal of previous catheter  SURGEON: [name], MD  ANESTHESIA:  Local with moderate conscious sedation for 13 minutes using 1 mg of Versed and 50 mcg of Fentanyl  ESTIMATED BLOOD LOSS: 3 cc  FINDING(S): none  SPECIMEN(S):  None  INDICATIONS:   Patient is a 72 y.o.female who presents with non-functional dialysis catheter and ESRD.  The patient needs long term dialysis access for their ESRD, and a Permcath is necessary.  Risks and benefits are discussed and informed consent is obtained.    DESCRIPTION: After obtaining full informed written consent, the patient was brought back to the vascular suite. The patient received moderate conscious sedation during a face-to-face encounter with me present throughout the entire procedure and supervising the RN monitoring the vital signs, pulse oximetry, telemetry, and mental status throughout the entire procedure. The patient's existing catheter, right thigh and groin were sterilely prepped and draped in a sterile surgical field was created.  The existing catheter was dissected free from the fibrous sheath securing the cuff with hemostats and blunt dissection.  A wire was placed. The existing catheter was then removed and the wire used to keep venous access. I selected a 44 cm tip to cuff tunneled dialysis catheter.  Using fluoroscopic guidance the catheter tips were parked in the right atrium. The appropriate distal connectors were placed. It withdrew blood well and flushed easily with heparinized saline and a concentrated heparin solution was then placed. It was secured to the chest wall with 2 Prolene sutures. A 4-0 Monocryl pursestring suture was placed around the exit site.  Sterile dressings were placed. The patient tolerated the procedure well and was taken to the recovery room in stable condition.  COMPLICATIONS: None  CONDITION: Stable  [name] 07/28/2022 5:36 PM   This note was created with Dragon Medical transcription system. Any errors in dictation are purely unintentional.

## 2022-07-31 ENCOUNTER — Encounter: Payer: Self-pay | Admitting: Vascular Surgery

## 2022-08-01 ENCOUNTER — Other Ambulatory Visit: Payer: Self-pay | Admitting: Family Medicine

## 2022-08-01 DIAGNOSIS — Z1231 Encounter for screening mammogram for malignant neoplasm of breast: Secondary | ICD-10-CM

## 2022-08-02 ENCOUNTER — Telehealth (INDEPENDENT_AMBULATORY_CARE_PROVIDER_SITE_OTHER): Payer: Self-pay

## 2022-08-02 NOTE — Telephone Encounter (Signed)
I received a message from [place] stating the patient has called stating her access is not working properly. I contacted the dialysis center at [place] and explained about the call and that if the patient is in need of a procedure or to be seen to please send over an order.

## 2022-09-12 ENCOUNTER — Ambulatory Visit
Admission: RE | Admit: 2022-09-12 | Discharge: 2022-09-12 | Disposition: A | Discharge status: Home / Self Care | Payer: Medicare (Managed Care) | Source: Ambulatory Visit | Attending: Family Medicine | Admitting: Family Medicine

## 2022-09-12 DIAGNOSIS — Z1231 Encounter for screening mammogram for malignant neoplasm of breast: Secondary | ICD-10-CM

## 2022-09-13 ENCOUNTER — Other Ambulatory Visit (INDEPENDENT_AMBULATORY_CARE_PROVIDER_SITE_OTHER): Payer: Self-pay | Admitting: Nurse Practitioner

## 2022-09-13 DIAGNOSIS — T829XXS Unspecified complication of cardiac and vascular prosthetic device, implant and graft, sequela: Secondary | ICD-10-CM

## 2022-09-13 DIAGNOSIS — N186 End stage renal disease: Secondary | ICD-10-CM

## 2022-09-19 ENCOUNTER — Telehealth (INDEPENDENT_AMBULATORY_CARE_PROVIDER_SITE_OTHER): Payer: Self-pay

## 2022-09-19 NOTE — Telephone Encounter (Signed)
Spoke with the patient and she is scheduled with Dr. Lucky Cowboy on 09/21/22 with a 12:00 pm arrival time to the [facility] for a permcath exchange. Pre-procedure instructions were discussed and patient stated she understood.

## 2022-09-21 ENCOUNTER — Encounter
Admission: RE | Disposition: A | Discharge status: Home / Self Care | Payer: Self-pay | Source: Home / Self Care | Attending: Vascular Surgery

## 2022-09-21 ENCOUNTER — Other Ambulatory Visit: Payer: Self-pay

## 2022-09-21 ENCOUNTER — Ambulatory Visit
Admission: RE | Admit: 2022-09-21 | Discharge: 2022-09-21 | Disposition: A | Discharge status: Home / Self Care | Payer: Medicare (Managed Care) | Attending: Vascular Surgery | Admitting: Vascular Surgery

## 2022-09-21 ENCOUNTER — Ambulatory Visit (INDEPENDENT_AMBULATORY_CARE_PROVIDER_SITE_OTHER): Payer: Medicare (Managed Care) | Admitting: Nurse Practitioner

## 2022-09-21 ENCOUNTER — Encounter (INDEPENDENT_AMBULATORY_CARE_PROVIDER_SITE_OTHER): Payer: Medicare (Managed Care)

## 2022-09-21 DIAGNOSIS — I120 Hypertensive chronic kidney disease with stage 5 chronic kidney disease or end stage renal disease: Secondary | ICD-10-CM

## 2022-09-21 DIAGNOSIS — T8249XA Other complication of vascular dialysis catheter, initial encounter: Secondary | ICD-10-CM | POA: Diagnosis present

## 2022-09-21 DIAGNOSIS — N186 End stage renal disease: Secondary | ICD-10-CM | POA: Diagnosis not present

## 2022-09-21 DIAGNOSIS — Z992 Dependence on renal dialysis: Secondary | ICD-10-CM | POA: Diagnosis not present

## 2022-09-21 DIAGNOSIS — Y841 Kidney dialysis as the cause of abnormal reaction of the patient, or of later complication, without mention of misadventure at the time of the procedure: Secondary | ICD-10-CM | POA: Diagnosis not present

## 2022-09-21 DIAGNOSIS — E1122 Type 2 diabetes mellitus with diabetic chronic kidney disease: Secondary | ICD-10-CM | POA: Diagnosis not present

## 2022-09-21 DIAGNOSIS — I2510 Atherosclerotic heart disease of native coronary artery without angina pectoris: Secondary | ICD-10-CM

## 2022-09-21 HISTORY — PX: DIALYSIS/PERMA CATHETER INSERTION: CATH118288

## 2022-09-21 LAB — GLUCOSE, CAPILLARY
Glucose-Capillary: 67 mg/dL — ABNORMAL LOW (ref 70–99)
Glucose-Capillary: 98 mg/dL (ref 70–99)

## 2022-09-21 LAB — POTASSIUM (ARMC VASCULAR LAB ONLY): Potassium (ARMC vascular lab): 4.1 mmol/L (ref 3.5–5.1)

## 2022-09-21 SURGERY — DIALYSIS/PERMA CATHETER INSERTION
Anesthesia: Moderate Sedation

## 2022-09-21 MED ORDER — SODIUM CHLORIDE 0.9 % IV SOLN: INTRAVENOUS | Status: DC

## 2022-09-21 MED ORDER — FENTANYL CITRATE (PF) 100 MCG/2ML IJ SOLN
INTRAMUSCULAR | Status: DC | PRN
  Administered 2022-09-21: 50 ug via INTRAVENOUS

## 2022-09-21 MED ORDER — DEXTROSE 50 % IV SOLN
INTRAVENOUS | Status: AC
  Filled 2022-09-21: qty 50

## 2022-09-21 MED ORDER — MIDAZOLAM HCL 2 MG/2ML IJ SOLN
INTRAMUSCULAR | Status: DC | PRN
  Administered 2022-09-21: 1 mg via INTRAVENOUS

## 2022-09-21 MED ORDER — METHYLPREDNISOLONE SODIUM SUCC 125 MG IJ SOLR
125.0000 mg | Freq: Once | INTRAMUSCULAR | Status: AC | PRN
  Administered 2022-09-21: 125 mg via INTRAVENOUS

## 2022-09-21 MED ORDER — ONDANSETRON HCL 4 MG/2ML IJ SOLN: 4.0000 mg | Freq: Four times a day (QID) | INTRAMUSCULAR | Status: DC | PRN

## 2022-09-21 MED ORDER — FAMOTIDINE 20 MG PO TABS
40.0000 mg | ORAL_TABLET | Freq: Once | ORAL | Status: AC | PRN
  Administered 2022-09-21: 40 mg via ORAL

## 2022-09-21 MED ORDER — HYDROMORPHONE HCL 1 MG/ML IJ SOLN: 1.0000 mg | Freq: Once | INTRAMUSCULAR | Status: DC | PRN

## 2022-09-21 MED ORDER — DEXTROSE 50 % IV SOLN
12.5000 g | Freq: Once | INTRAVENOUS | Status: AC
  Administered 2022-09-21: 12.5 g via INTRAVENOUS

## 2022-09-21 MED ORDER — FAMOTIDINE 20 MG PO TABS
ORAL_TABLET | ORAL | Status: AC
  Filled 2022-09-21: qty 2

## 2022-09-21 MED ORDER — FENTANYL CITRATE PF 50 MCG/ML IJ SOSY
PREFILLED_SYRINGE | INTRAMUSCULAR | Status: AC
  Filled 2022-09-21: qty 1

## 2022-09-21 MED ORDER — VANCOMYCIN HCL IN DEXTROSE 1-5 GM/200ML-% IV SOLN
1000.0000 mg | INTRAVENOUS | Status: AC
  Administered 2022-09-21: 1000 mg via INTRAVENOUS

## 2022-09-21 MED ORDER — DIPHENHYDRAMINE HCL 50 MG/ML IJ SOLN
50.0000 mg | Freq: Once | INTRAMUSCULAR | Status: AC | PRN
  Administered 2022-09-21: 50 mg via INTRAVENOUS

## 2022-09-21 MED ORDER — DIPHENHYDRAMINE HCL 50 MG/ML IJ SOLN
INTRAMUSCULAR | Status: AC
  Filled 2022-09-21: qty 1

## 2022-09-21 MED ORDER — MIDAZOLAM HCL 2 MG/ML PO SYRP: 8.0000 mg | ORAL_SOLUTION | Freq: Once | ORAL | Status: DC | PRN

## 2022-09-21 MED ORDER — METHYLPREDNISOLONE SODIUM SUCC 125 MG IJ SOLR
INTRAMUSCULAR | Status: AC
  Filled 2022-09-21: qty 2

## 2022-09-21 MED ORDER — MIDAZOLAM HCL 2 MG/2ML IJ SOLN
INTRAMUSCULAR | Status: AC
  Filled 2022-09-21: qty 2

## 2022-09-21 MED ORDER — VANCOMYCIN HCL IN DEXTROSE 1-5 GM/200ML-% IV SOLN
INTRAVENOUS | Status: AC
  Filled 2022-09-21: qty 200

## 2022-09-21 SURGICAL SUPPLY — 6 items
CATH PALINDROME-P 44CM KIT (CATHETERS) ×1
GUIDEWIRE SUPER STIFF .035X180 (WIRE) IMPLANT
KIT CATH CHRNC PALINDROME PRCS (CATHETERS) IMPLANT
PACK ANGIOGRAPHY (CUSTOM PROCEDURE TRAY) IMPLANT
SUT MNCRL AB 4-0 PS2 18 (SUTURE) IMPLANT
SUT PROLENE 0 CT 1 30 (SUTURE) IMPLANT

## 2022-09-21 NOTE — Discharge Instructions (Signed)
Tunneled Catheter Insertion, Care After The following information offers guidance on how to care for yourself after your procedure. Your health care provider may also give you more specific instructions. If you have problems or questions, contact your health care provider. What can I expect after the procedure? After the procedure, it is common to have: Some mild redness, bruising, swelling, and pain around your catheter site. A small amount of blood or clear fluid coming from your incisions. Follow these instructions at home: Medicines Take over-the-counter and prescription medicines only as told by your health care provider. If you were prescribed an antibiotic medicine, take it as told by your health care provider. Do not stop taking the antibiotic even if you start to feel better. Incision care  Follow instructions from your health care provider about how to take care of your incisions. Make sure you: Your dialysis center will perform all needed dressing changes.  Leave stitches (sutures), skin glue, or adhesive strips in place. These skin closures may need to stay in place for 2 weeks or longer. Keep your dressings clean and dry. Check your incision areas every day for signs of infection. Check for: More redness, swelling, or pain. More fluid or blood. Warmth. Pus or a bad smell. Catheter care  Keep your catheter site clean and dry. Do not shower, sponge bath only while catheter in place.  Your dialysis center will Flush your catheter per the dialysis center protocol.  This helps prevent it from becoming clogged. Leave thecaps on the ends of the catheter when not in use. Do not pull on your catheter. Activity Return to your normal activities as told by your health care provider. Ask your health care provider what activities are safe for you. Follow any other activity restrictions as instructed by your health care provider. Do not lift anything that is heavier than 10 lb (4.5 kg), or  the limit that you are told, until your health care provider says that it is safe. Driving Do not drive for 24 hours.  General instructions Follow your health care provider's specific instructions for the type of catheter that you have. Do not take baths, swim, or use a hot tub while your catheter is in place. Keep all follow-up visits. This is important to prevent infection.  Contact a health care provider if: You feel unusually weak or nauseous. You have a fever or chills. You have more redness, swelling, or pain at your incisions or around the area where your catheter has been inserted or where it exits. You have pus or a bad smell coming from your catheter site. Your catheter site feels warm to the touch. Your catheter is not working properly. Fluid is leaking from the catheter, under the dressing, or around the dressing. Your dialysis center is unable to flush your catheter. Get help right away if: Your catheter develops a hole or it breaks. Your catheter comes loose or gets pulled completely out. If this happens, press on your catheter site firmly with a clean cloth until you can get medical help. You develop bleeding from your catheter or your insertion site, and your bleeding does not stop. You have swelling in your shoulder, neck, chest, or face. You have pain or swelling when fluids or medicines are being given through the catheter. You have chest pain or difficulty breathing. These symptoms may represent a serious problem that is an emergency. Do not wait to see if the symptoms will go away. Get medical help right away. Call your   local emergency services (911 in the [place] Do not drive yourself to the hospital. Summary After the procedure, it is common to have mild redness, swelling, and pain around your catheter site. Return to your normal activities as told by your health care provider. Ask your health care provider what activities are safe for you. Follow your health care  provider's specific instructions for the type of catheter that you have. Keep your catheter site and your dressings clean and dry. Contact a health care provider if your catheter is not working properly. Get help right away if you have chest pain, difficulty breathing, or your catheter comes loose or gets pulled completely out. This information is not intended to replace advice given to you by your health care provider. Make sure you discuss any questions you have with your health care provider. Document Revised: 10/11/2020 Document Reviewed: 10/11/2020 Elsevier Patient Education  2023 Elsevier Inc.     

## 2022-09-21 NOTE — H&P (Signed)
[facility] Admission History & Physical  MRN : [id number]  Amy Wu is a 72 y.o. (1951/05/01) female who presents with chief complaint of No chief complaint on file. Marland Kitchen  History of Present Illness:  I am asked to evaluate the patient by the dialysis center. The patient was sent here because they were unable to achieve adequate dialysis yesterday. Furthermore the Center states they were unable to aspirate either lumen of the catheter yesterday.  This problem has been getting worse for about 1 week. The patient is unaware of any other change.   Patient denies pain or tenderness overlying the access.  There is no pain with dialysis.  Patient denies fevers or shaking chills while on dialysis.    There have multiple any past interventions and declots of his multiple different access.  The patient is not chronically hypotensive on dialysis.   Current Facility-Administered Medications  Medication Dose Route Frequency Provider Last Rate Last Admin   0.9 %  sodium chloride infusion   Intravenous Continuous Kris Hartmann, NP       diphenhydrAMINE (BENADRYL) injection 50 mg  50 mg Intravenous Once PRN Kris Hartmann, NP       famotidine (PEPCID) tablet 40 mg  40 mg Oral Once PRN Kris Hartmann, NP       HYDROmorphone (DILAUDID) injection 1 mg  1 mg Intravenous Once PRN Kris Hartmann, NP       methylPREDNISolone sodium succinate (SOLU-MEDROL) 125 mg/2 mL injection 125 mg  125 mg Intravenous Once PRN Kris Hartmann, NP       midazolam (VERSED) 2 MG/ML syrup 8 mg  8 mg Oral Once PRN Kris Hartmann, NP       ondansetron [facility]) injection 4 mg  4 mg Intravenous Q6H PRN Kris Hartmann, NP       vancomycin (VANCOCIN) IVPB 1000 mg/200 mL premix  1,000 mg Intravenous 60 min Pre-Op Kris Hartmann, NP         Past Surgical History:  Procedure Laterality Date   A/V SHUNT INTERVENTION Left 11/20/2019   Procedure: A/V SHUNT INTERVENTION (Left Thigh);  Surgeon: Algernon Huxley, MD;  Location: [facility];  Service: Cardiovascular;  Laterality: Left;   A/V SHUNTOGRAM Left 02/16/2017   Procedure: A/V Shuntogram;  Surgeon: Algernon Huxley, MD;  Location: [place] [facility];  Service: Cardiovascular;  Laterality: Left;   A/V SHUNTOGRAM Left 12/13/2017   Procedure: A/V SHUNTOGRAM;  Surgeon: Algernon Huxley, MD;  Location: [place] [facility];  Service: Cardiovascular;  Laterality: Left;   A/V SHUNTOGRAM Left 12/05/2018   Procedure: A/V SHUNTOGRAM;  Surgeon: Algernon Huxley, MD;  Location: [place] [facility];  Service: Cardiovascular;  Laterality: Left;   A/V SHUNTOGRAM Left 06/03/2020   Procedure: A/V SHUNTOGRAM;  Surgeon: Algernon Huxley, MD;  Location: [place] [facility];  Service: Cardiovascular;  Laterality: Left;   A/V SHUNTOGRAM Left 05/24/2021   Procedure: A/V SHUNTOGRAM;  Surgeon: Katha Cabal, MD;  Location: [place] [facility];  Service: Cardiovascular;  Laterality: Left;  Thigh Graft   ABOVE KNEE LEG AMPUTATION     AV FISTULA PLACEMENT Bilateral    AV FISTULA PLACEMENT Left 07/01/2015   Procedure: INSERTION OF ARTERIOVENOUS (AV) GRAFT THIGH (ARTEGRAFT);  Surgeon: Algernon Huxley, MD;  Location: [facility];  Service: Vascular;  Laterality: Left;   BELOW KNEE LEG AMPUTATION Left    BREAST BIOPSY  CATARACT EXTRACTION W/PHACO Left 07/10/2022   Procedure: CATARACT EXTRACTION PHACO AND INTRAOCULAR LENS PLACEMENT (IOC) LEFT DIABETIC  12.98  01:13.7;  Surgeon: Eulogio Bear, MD;  Location: [place];  Service: Ophthalmology;  Laterality: Left;   DIALYSIS/PERMA CATHETER INSERTION N/A 05/26/2021   Procedure: DIALYSIS/PERMA CATHETER INSERTION;  Surgeon: Algernon Huxley, MD;  Location: [place] [facility];  Service: Cardiovascular;  Laterality: N/A;   DIALYSIS/PERMA CATHETER INSERTION N/A 07/05/2022   Procedure: DIALYSIS/PERMA CATHETER INSERTION;  Surgeon: Algernon Huxley, MD;  Location: [place] [facility];  Service: Cardiovascular;  Laterality: N/A;    DIALYSIS/PERMA CATHETER INSERTION N/A 07/28/2022   Procedure: DIALYSIS/PERMA CATHETER INSERTION;  Surgeon: Algernon Huxley, MD;  Location: [place] [facility];  Service: Cardiovascular;  Laterality: N/A;   ENDARTERECTOMY FEMORAL Left 07/01/2015   Procedure: SUPERFICIAL FEMORAL ENDARTERECTOMY ;  Surgeon: Algernon Huxley, MD;  Location: [facility];  Service: Vascular;  Laterality: Left;   EXCHANGE OF A DIALYSIS CATHETER  02/11/2015   Procedure: Exchange Of A Dialysis Catheter;  Surgeon: Algernon Huxley, MD;  Location: [place] [facility];  Service: Cardiovascular;;   LEG AMPUTATION ABOVE KNEE Right    PERIPHERAL VASCULAR CATHETERIZATION N/A 02/11/2015   Procedure: Dialysis/Perma Catheter Insertion;  Surgeon: Algernon Huxley, MD;  Location: [place] [facility];  Service: Cardiovascular;  Laterality: N/A;   PERIPHERAL VASCULAR CATHETERIZATION N/A 05/11/2015   Procedure: Dialysis/Perma Catheter Insertion, exchange;  Surgeon: Katha Cabal, MD;  Location: [place] [facility];  Service: Cardiovascular;  Laterality: N/A;   PERIPHERAL VASCULAR CATHETERIZATION N/A 05/18/2015   Procedure: Dialysis/Perma Catheter Insertion;  Surgeon: Katha Cabal, MD;  Location: [place] [facility];  Service: Cardiovascular;  Laterality: N/A;   PERIPHERAL VASCULAR CATHETERIZATION Left 07/19/2015   Procedure: A/V Shuntogram/Fistulagram;  Surgeon: Algernon Huxley, MD;  Location: [place] [facility];  Service: Cardiovascular;  Laterality: Left;   PERIPHERAL VASCULAR CATHETERIZATION N/A 07/19/2015   Procedure: A/V Shunt Intervention;  Surgeon: Algernon Huxley, MD;  Location: [place] [facility];  Service: Cardiovascular;  Laterality: N/A;   PERIPHERAL VASCULAR CATHETERIZATION N/A 08/26/2015   Procedure: Dialysis/Perma Catheter Removal;  Surgeon: Algernon Huxley, MD;  Location: [place] [facility];  Service: Cardiovascular;  Laterality: N/A;   PERIPHERAL VASCULAR THROMBECTOMY N/A 12/29/2016   Procedure: Peripheral Vascular Thrombectomy;  Surgeon:  Katha Cabal, MD;  Location: [facility] [facility];  Service: Cardiovascular;  Laterality: N/A;    Social History Social History   Tobacco Use   Smoking status: Never   Smokeless tobacco: Never  Vaping Use   Vaping Use: Never used  Substance Use Topics   Alcohol use: No   Drug use: No    Family History Family History  Problem Relation Age of Onset   Breast cancer Neg Hx     No family history of bleeding or clotting disorders, autoimmune disease or porphyria  Allergies  Allergen Reactions   Ancef [Cefazolin] Palpitations   Contrast Media [Iodinated Contrast Media] Palpitations     REVIEW OF SYSTEMS (Negative unless checked)  Constitutional: [] Weight loss  [] Fever  [] Chills Cardiac: [] Chest pain   [] Chest pressure   [] Palpitations   [] Shortness of breath when laying flat   [] Shortness of breath at rest   [x] Shortness of breath with exertion. Vascular:  [] Pain in legs with walking   [] Pain in legs at rest   [] Pain in legs when laying flat   [] Claudication   [] Pain in feet when walking  []   Pain in feet at rest  [] Pain in feet when laying flat   [] History of DVT   [] Phlebitis   [] Swelling in legs   [] Varicose veins   [] Non-healing ulcers Pulmonary:   [] Uses home oxygen   [] Productive cough   [] Hemoptysis   [] Wheeze  [] COPD   [] Asthma Neurologic:  [] Dizziness  [] Blackouts   [] Seizures   [] History of stroke   [] History of TIA  [] Aphasia   [] Temporary blindness   [] Dysphagia   [] Weakness or numbness in arms   [] Weakness or numbness in legs Musculoskeletal:  [] Arthritis   [] Joint swelling   [] Joint pain   [] Low back pain Hematologic:  [] Easy bruising  [] Easy bleeding   [] Hypercoagulable state   [] Anemic  [] Hepatitis Gastrointestinal:  [] Blood in stool   [] Vomiting blood  [] Gastroesophageal reflux/heartburn   [] Difficulty swallowing. Genitourinary:  [x] Chronic kidney disease   [] Difficult urination  [] Frequent urination  [] Burning with urination   [] Blood in urine Skin:  [] Rashes    [] Ulcers   [] Wounds Psychological:  [] History of anxiety   []  History of major depression.  Physical Examination  There were no vitals filed for this visit. There is no height or weight on file to calculate BMI. Gen: WD/WN, NAD Head: [place]/AT, No temporalis wasting. Prominent temp pulse not noted. Ear/Nose/Throat: Hearing grossly intact, nares w/o erythema or drainage, oropharynx w/o Erythema/Exudate,  Eyes: Conjunctiva clear, sclera non-icteric Neck: Trachea midline.  No JVD.  Pulmonary:  Good air movement, respirations not labored, no use of accessory muscles.  Cardiac: RRR, normal S1, S2. Vascular: right femoral tunneled catheter without tenderness or drainage Vessel Right Left  Radial Palpable Palpable  Gastrointestinal: soft, non-tender/non-distended. No guarding/reflex.  Musculoskeletal: M/S 5/5 throughout.  Extremities without ischemic changes.  No deformity or atrophy.  Neurologic: Sensation grossly intact in extremities.  Symmetrical.  Speech is fluent. Motor exam as listed above. Psychiatric: Judgment intact, Mood & affect appropriate for pt's clinical situation. Dermatologic: No rashes or ulcers noted.  No cellulitis or open wounds. Lymph : No Cervical, Axillary, or Inguinal lymphadenopathy.   CBC Lab Results  Component Value Date   WBC 8.0 05/24/2021   HGB 9.3 (L) 05/24/2021   HCT 27.8 (L) 05/24/2021   MCV 79.4 (L) 05/24/2021   PLT 198 05/24/2021    BMET    Component Value Date/Time   NA 138 05/26/2021 0508   NA 138 12/03/2012 1414   K 3.7 07/10/2022 0950   K 4.5 12/03/2012 1414   CL 96 (L) 05/26/2021 0508   CL 101 12/03/2012 1414   CO2 30 05/26/2021 0508   CO2 27 12/03/2012 1414   GLUCOSE 204 (H) 05/26/2021 0508   GLUCOSE 326 (H) 12/03/2012 1414   BUN 51 (H) 05/26/2021 0508   BUN 37 (H) 12/03/2012 1414   CREATININE 8.86 (H) 05/26/2021 0508   CREATININE 7.04 (H) 12/03/2012 1414   CALCIUM 9.2 05/26/2021 0508   CALCIUM 8.4 (L) 12/03/2012 1414   GFRNONAA 4  (L) 05/26/2021 0508   GFRNONAA 6 (L) 12/03/2012 1414   GFRAA 9 (L) 11/19/2019 1002   GFRAA 7 (L) 12/03/2012 1414   CrCl cannot be calculated (Patient's most recent lab result is older than the maximum 21 days allowed.).  COAG Lab Results  Component Value Date   INR 1.1 05/23/2021   INR 1.0 11/19/2019   INR 1.11 06/22/2015    Radiology MM 3D SCREEN BREAST BILATERAL  Result Date: 09/13/2022 CLINICAL DATA:  Screening. EXAM: DIGITAL SCREENING BILATERAL MAMMOGRAM WITH TOMOSYNTHESIS AND CAD  TECHNIQUE: Bilateral screening digital craniocaudal and mediolateral oblique mammograms were obtained. Bilateral screening digital breast tomosynthesis was performed. The images were evaluated with computer-aided detection. COMPARISON:  Previous exam(s). ACR Breast Density Category b: There are scattered areas of fibroglandular density. FINDINGS: There are no findings suspicious for malignancy. IMPRESSION: No mammographic evidence of malignancy. A result letter of this screening mammogram will be mailed directly to the patient. RECOMMENDATION: Screening mammogram in one year. (Code:SM-B-01Y) BI-RADS CATEGORY  1: Negative. Electronically Signed   By: Franki Cabot M.D.   On: 09/13/2022 08:52    Assessment/Plan 1.  Complication dialysis device with thrombosis AV access:  Patient's right femoral tunneled catheter is thrombosed. The patient will undergo exchange of the catheter same venous access using interventional techniques.  The risks and benefits were described to the patient.  All questions were answered.  The patient agrees to proceed with intervention.  2.  End-stage renal disease requiring hemodialysis:  Patient will continue dialysis therapy without further interruption if a successful exchange is not achieved then new site will be found for tunneled catheter placement. Dialysis has already been arranged since the patient missed their previous session 3.  Hypertension:  Patient will continue medical  management; nephrology is following no changes in oral medications. 4. Diabetes mellitus:  Glucose will be monitored and oral medications been held this morning once the patient has undergone the patient's procedure po intake will be reinitiated and again Accu-Cheks will be used to assess the blood glucose level and treat as needed. The patient will be restarted on the patient's usual hypoglycemic regime 5.  Coronary artery disease:  EKG will be monitored. Nitrates will be used if needed. The patient's oral cardiac medications will be continued.    [name], MD  09/21/2022 12:51 PM

## 2022-09-21 NOTE — Op Note (Signed)
OPERATIVE NOTE    PRE-OPERATIVE DIAGNOSIS: 1. ESRD 2. Non-functional permcath  POST-OPERATIVE DIAGNOSIS: same as above  PROCEDURE: Fluoroscopic guidance for placement of catheter Placement of a 44 cm tip to cuff tunneled hemodialysis catheter via the right femoral vein and removal of previous catheter  SURGEON: [name], MD  ANESTHESIA:  Local with moderate conscious sedation for 13 minutes using 1 mg of Versed and 50 mcg of Fentanyl  ESTIMATED BLOOD LOSS: 3 cc  FINDING(S): none  SPECIMEN(S):  None  INDICATIONS:   Patient is a 72 y.o.female who presents with non-functional dialysis catheter and ESRD.  The patient needs long term dialysis access for their ESRD, and a Permcath is necessary.  Risks and benefits are discussed and informed consent is obtained.    DESCRIPTION: After obtaining full informed written consent, the patient was brought back to the vascular suite. The patient received moderate conscious sedation during a face-to-face encounter with me present throughout the entire procedure and supervising the RN monitoring the vital signs, pulse oximetry, telemetry, and mental status throughout the entire procedure. The patient's existing catheter, right thigh and groin were sterilely prepped and draped in a sterile surgical field was created.  The existing catheter was dissected free from the fibrous sheath securing the cuff with hemostats and blunt dissection.  A wire was placed. The existing catheter was then removed and the wire used to keep venous access. I selected a 44 cm tip to cuff tunneled dialysis catheter.  Using fluoroscopic guidance the catheter tips were parked just below the right atrium. The appropriate distal connectors were placed. It withdrew blood well and flushed easily with heparinized saline and a concentrated heparin solution was then placed. It was secured to the chest wall with 2 Prolene sutures. A 4-0 Monocryl pursestring suture was placed around the exit  site. Sterile dressings were placed. The patient tolerated the procedure well and was taken to the recovery room in stable condition.  COMPLICATIONS: None  CONDITION: Stable  [name] 09/21/2022 2:30 PM   This note was created with Dragon Medical transcription system. Any errors in dictation are purely unintentional.

## 2022-09-22 ENCOUNTER — Encounter: Payer: Self-pay | Admitting: Vascular Surgery

## 2022-12-01 IMAGING — MR MR HEAD W/O CM
13 series · 48 of 48 positions shown · non-contrast
Comparison: Head CT yesterday.  MRI 06/24/2010.

CLINICAL DATA: Mental status changes of unknown cause beginning
yesterday.

EXAM:
MRI HEAD WITHOUT CONTRAST
TECHNIQUE: Multiplanar, multiecho pulse sequences of the brain and surrounding
structures were obtained without intravenous contrast.

[Series 5: ax dwi_tracew · axial · 3.0mm · 0.71mm/px · z∈[-98,+60]mm · 3 of 54 slices shown]
[im 1/54]
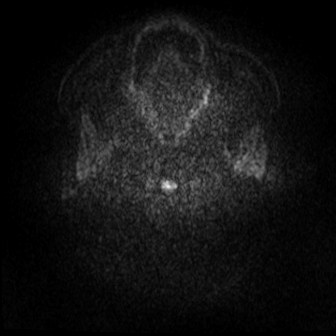
[im 27/54]
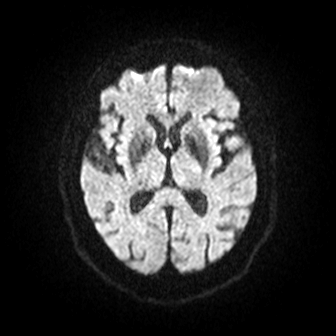
[im 54/54]
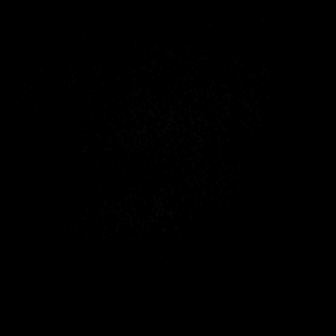

[Series 6: ax dwi_adc · axial · 3.0mm · 0.71mm/px · z∈[-98,+51]mm · 3 of 51 slices shown]
[im 1/51]
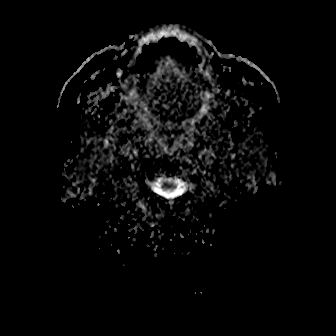
[im 26/51]
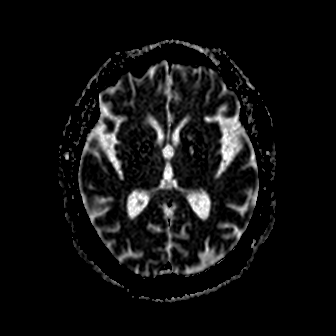
[im 51/51]
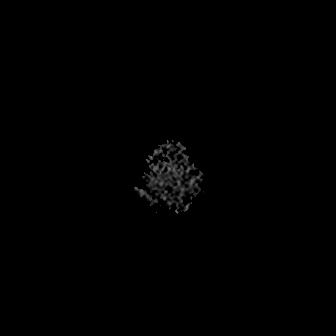

[Series 7: cor dwi_tracew · coronal · 5.0mm · 0.68mm/px · 3 of 40 slices shown]
[im 1/40]
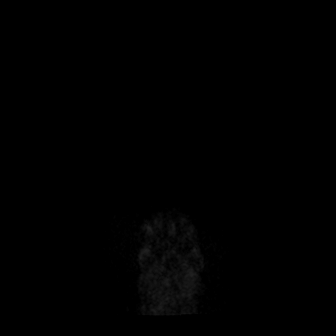
[im 20/40]
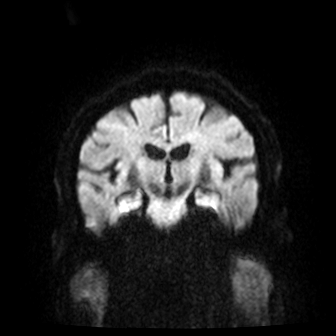
[im 40/40]
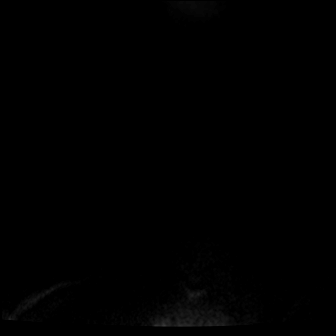

[Series 8: cor dwi_adc · coronal · 5.0mm · 0.68mm/px · 3 of 40 slices shown]
[im 1/40]
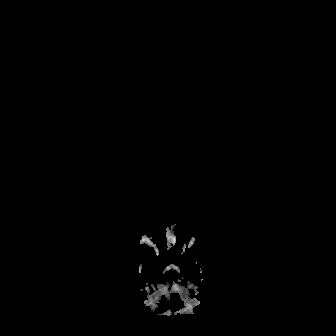
[im 20/40]
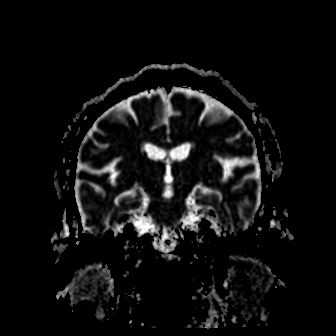
[im 40/40]
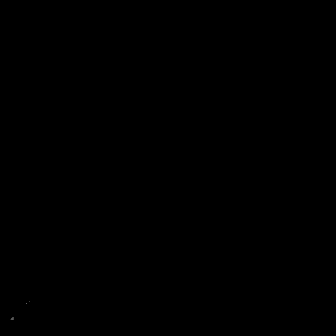

[Series 9: T1 · sagittal · 5.0mm · 0.47mm/px · 2 of 23 slices shown (1 of 2)]
[im 1/23]
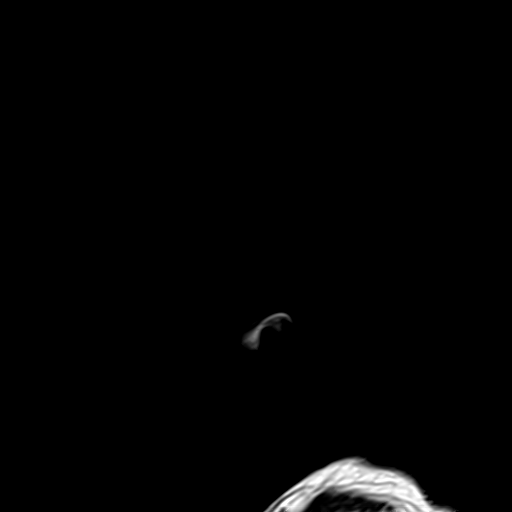
[im 23/23]
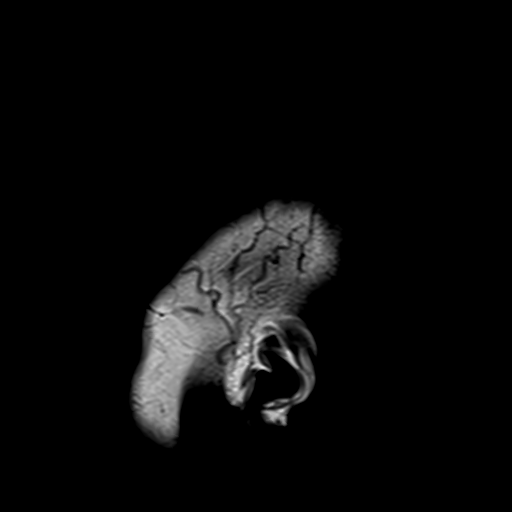

[Series 10: T2 · axial · 5.0mm · 0.86mm/px · z∈[-91,+52]mm · 2 of 25 slices shown (1 of 2)]
[im 1/25]
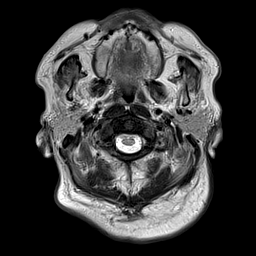
[im 25/25]
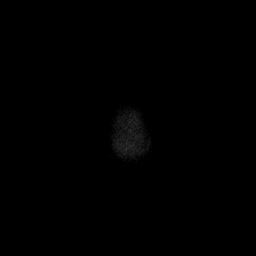

[Series 11: mag_images · axial · 3.0mm · 0.90mm/px · z∈[-96,+56]mm · 4 of 52 slices shown]
[im 1/52]
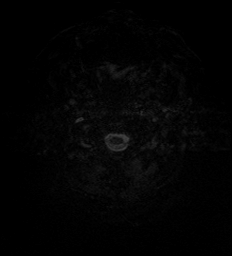
[im 18/52]
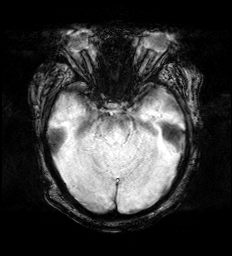
[im 35/52]
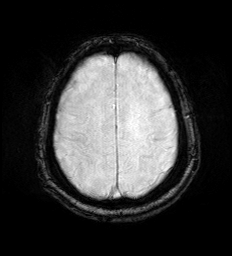
[im 52/52]
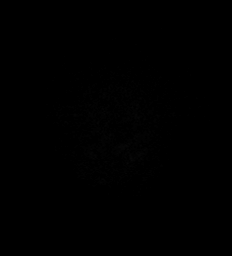

[Series 12: pha_images · axial · 3.0mm · 0.90mm/px · z∈[-96,+47]mm · 3 of 49 slices shown]
[im 1/49]
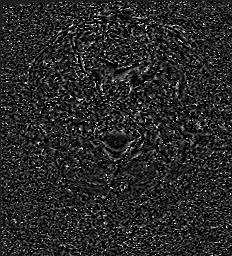
[im 25/49]
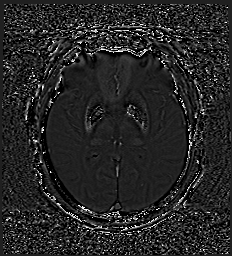
[im 49/49]
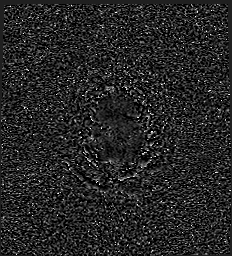

[Series 13: swi_images · axial · 3.0mm · 0.90mm/px · z∈[-96,+56]mm · 4 of 52 slices shown]
[im 1/52]
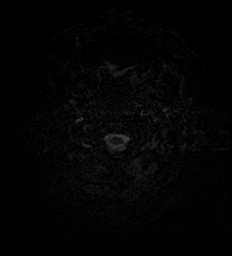
[im 18/52]
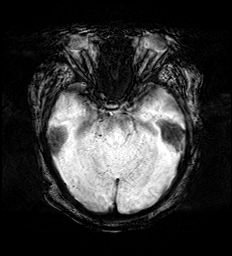
[im 35/52]
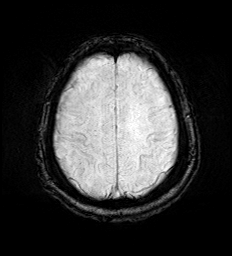
[im 52/52]
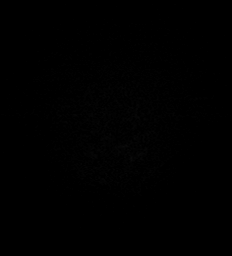

[Series 14: mip_images(sw) · axial · 24.0mm · 0.90mm/px · z∈[-86,+45]mm · 3 of 45 slices shown]
[im 1/45]
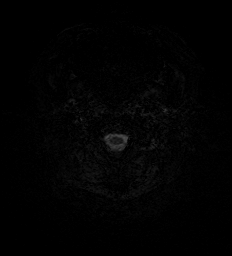
[im 23/45]
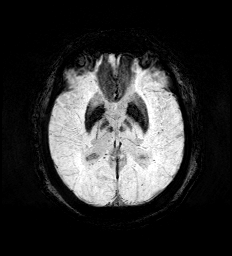
[im 45/45]
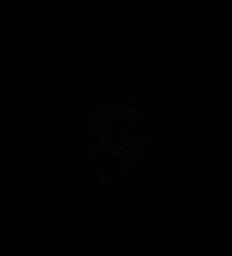

[Series 15: FLAIR · axial · 3.0mm · 0.69mm/px · z∈[-100,+61]mm · 4 of 55 slices shown]
[im 1/55]
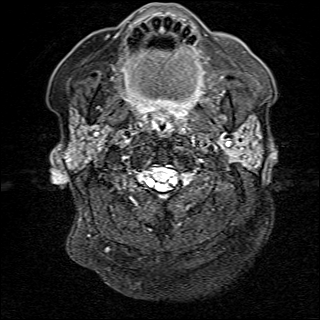
[im 19/55]
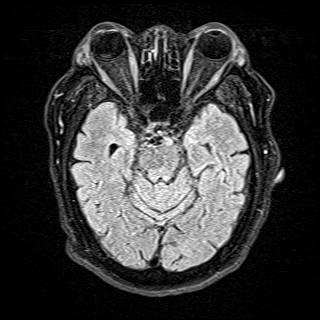
[im 37/55]
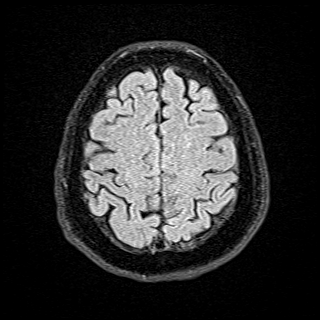
[im 55/55]
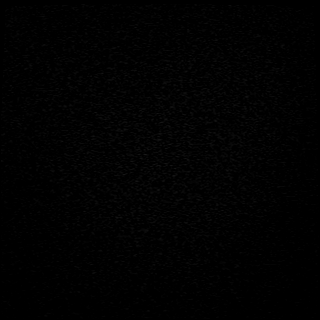

[Series 16: T1 · axial · 1.0mm · 0.98mm/px · z∈[-107,+65]mm · 12 of 174 slices shown (2 of 2)]
[im 1/174]
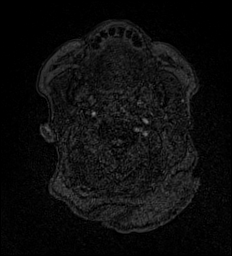
[im 16/174]
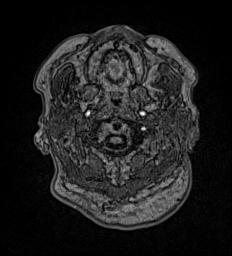
[im 32/174]
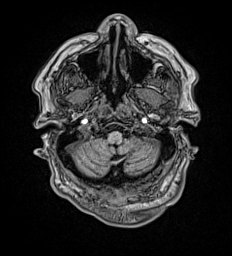
[im 48/174]
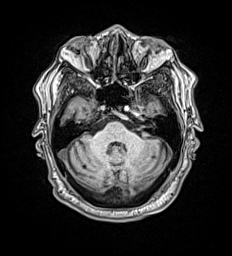
[im 63/174]
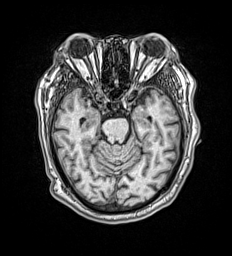
[im 79/174]
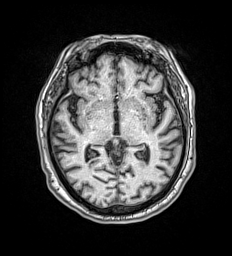
[im 95/174]
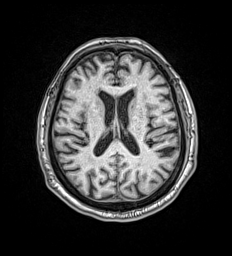
[im 111/174]
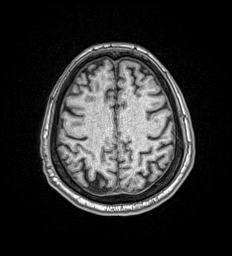
[im 126/174]
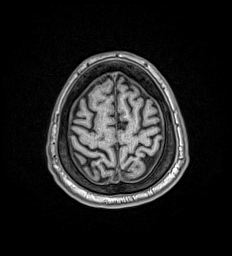
[im 142/174]
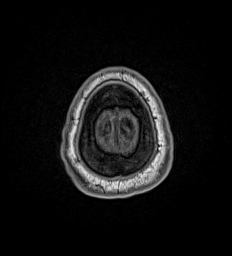
[im 158/174]
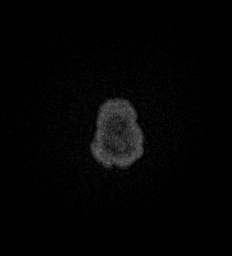
[im 174/174]
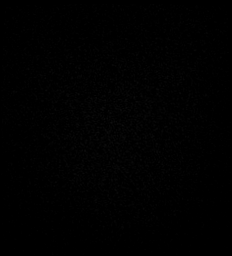

[Series 17: T2 · coronal · 5.0mm · 0.86mm/px · 2 of 30 slices shown (2 of 2)]
[im 1/30]
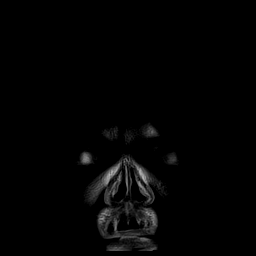
[im 30/30]
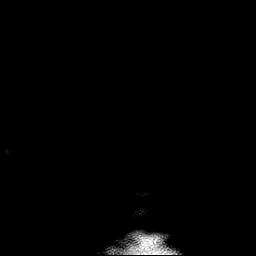

[48 of 48 positions shown; findings below may reference images not displayed]

FINDINGS: Brain: Diffusion imaging does not show any acute or subacute
infarction. There are old small vessel cerebellar infarctions. No
brainstem abnormality is seen. There are old small vessel
infarctions of the basal ganglia and there are minor chronic
small-vessel ischemic changes of the cerebral hemispheric white
matter. There is a single old small left parietal cortical
infarction. No mass lesion, hemorrhage, hydrocephalus or extra-axial
collection. Scattered punctate foci of hemosiderin deposition within
the brain probably relate to chronic hypertension.

Vascular: Major vessels at the base of the brain show flow.

Skull and upper cervical spine: Negative

Sinuses/Orbits: Clear sinuses. Previous scleral banding on the
right.

Other: None
IMPRESSION: No acute or reversible finding. Old small vessel infarctions of the
cerebellum, basal ganglia and cerebral hemispheric white matter.
Tiny old left parietal cortical infarction at the vertex. Punctate
foci of hemosiderin deposition within the brain likely secondary to
chronic hypertension.

## 2022-12-01 IMAGING — CT CT RENAL STONE PROTOCOL
2 of 4 series · 16 of 46 positions shown, 18 images · non-contrast
Comparison: None.

CLINICAL DATA: Mid abdominal pain.

EXAM:
CT ABDOMEN AND PELVIS WITHOUT CONTRAST
TECHNIQUE: Multidetector CT imaging of the abdomen and pelvis was performed
following the standard protocol without IV contrast.

[Series 2: stone full standard · axial · 0.98mm/px · z∈[-1188,-762]mm · 13 of 95 slices shown, 15 images]
[im 5/95  soft-tissue]
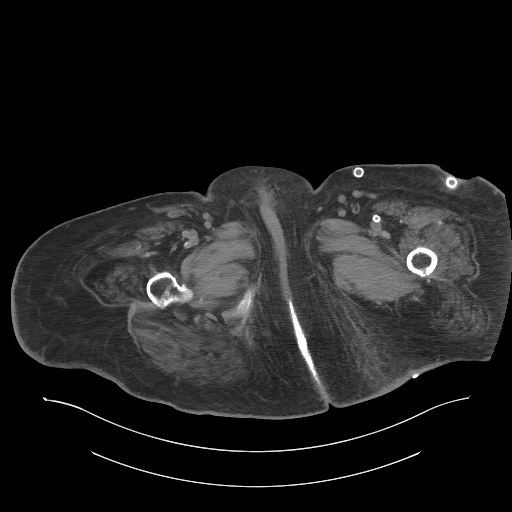
[im 5/95  bone]
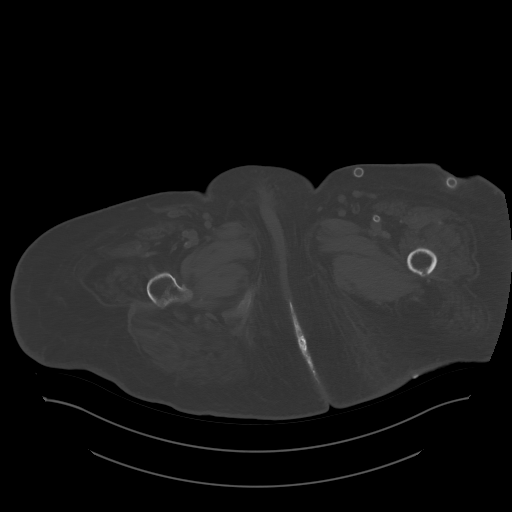
[im 13/95  soft-tissue]
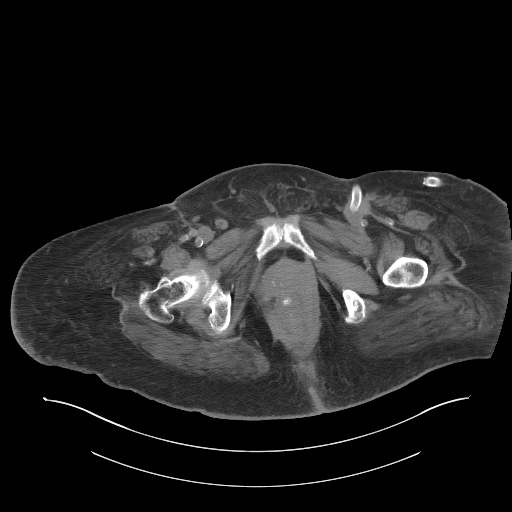
[im 21/95  soft-tissue]
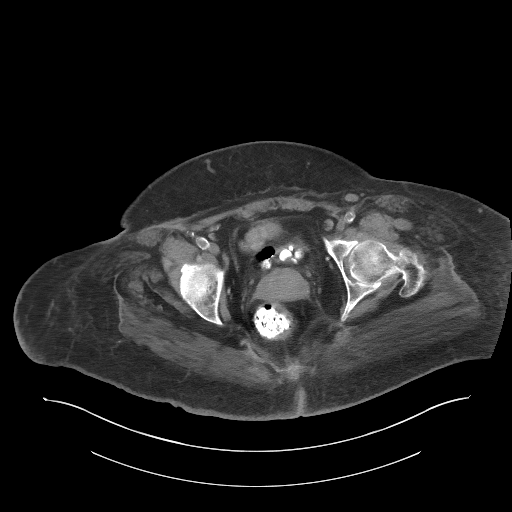
[im 25/95  soft-tissue]
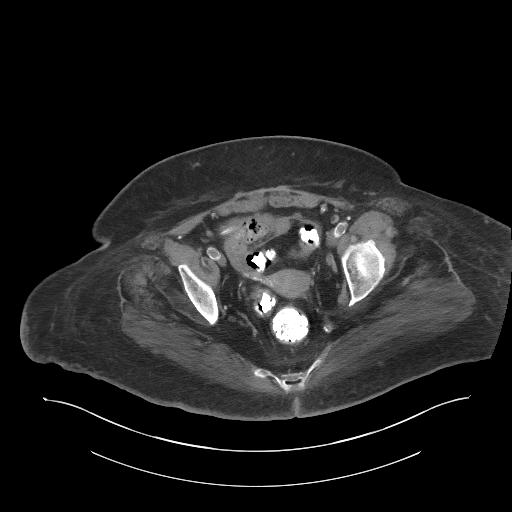
[im 33/95  soft-tissue]
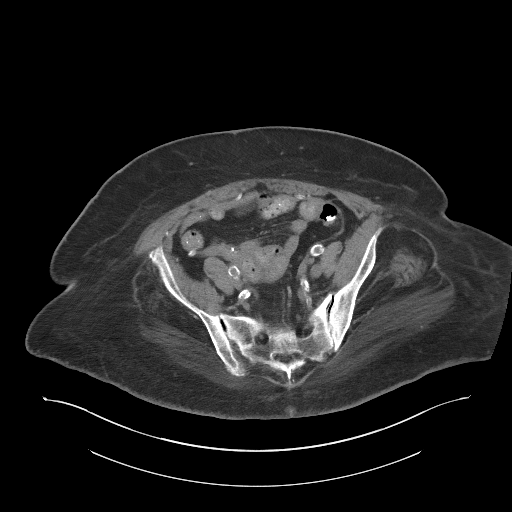
[im 41/95  soft-tissue]
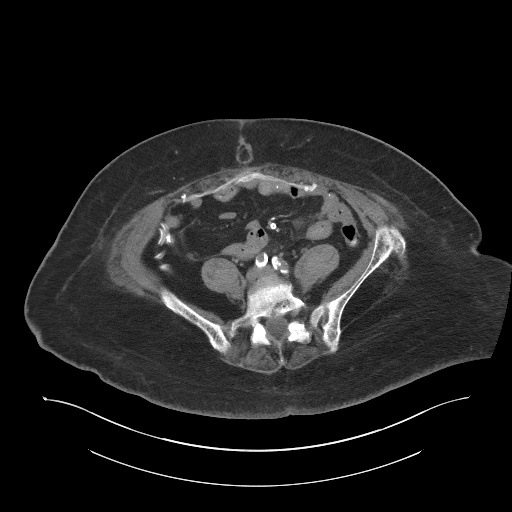
[im 50/95  soft-tissue]
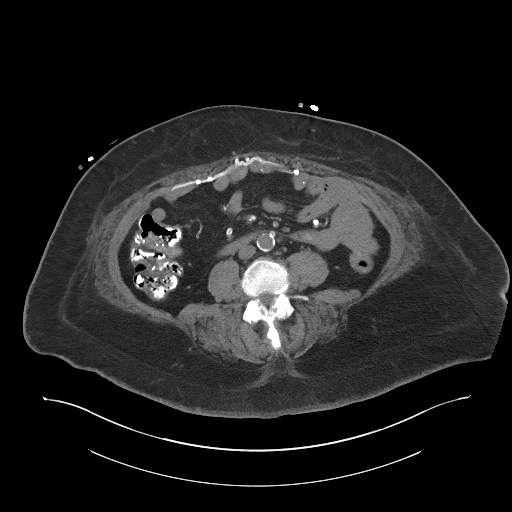
[im 54/95  soft-tissue]
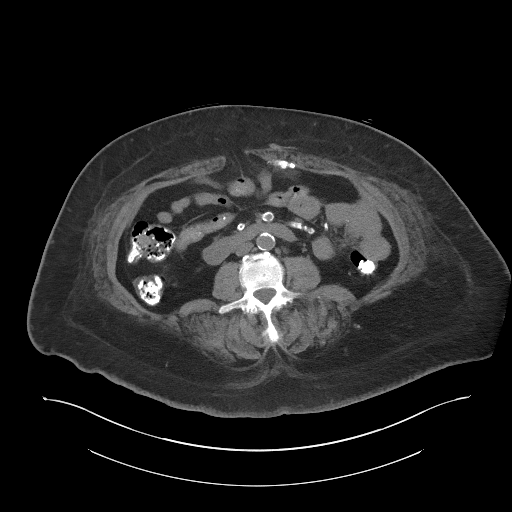
[im 62/95  soft-tissue]
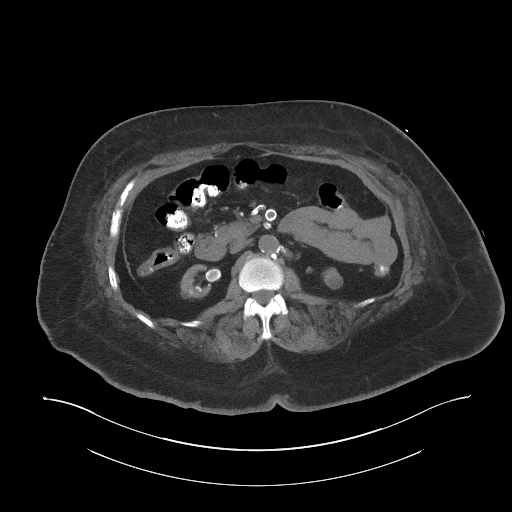
[im 62/95  bone]
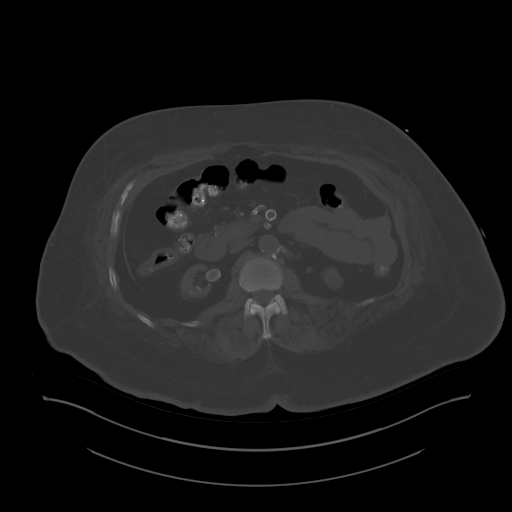
[im 70/95  soft-tissue]
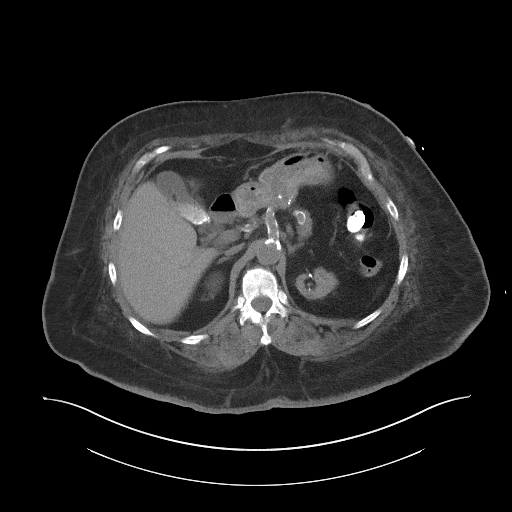
[im 74/95  soft-tissue]
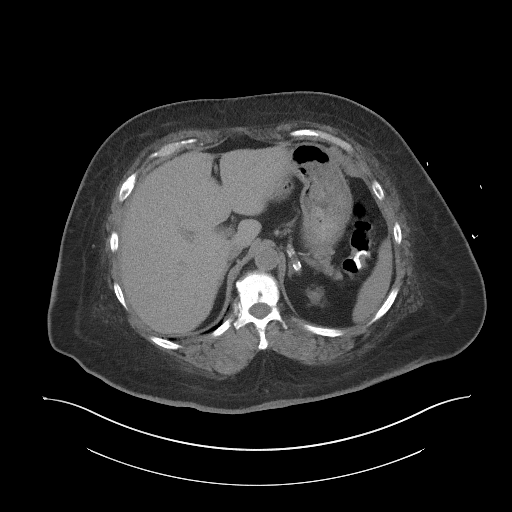
[im 82/95  soft-tissue]
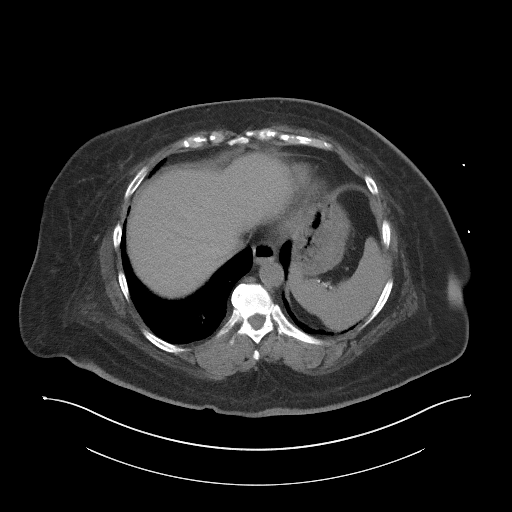
[im 90/95  soft-tissue]
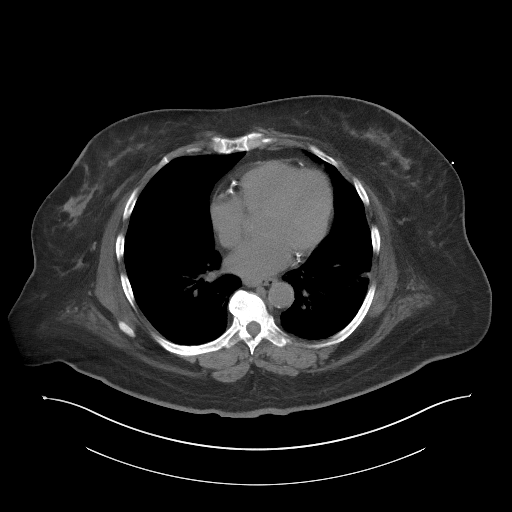

[Series 5: coronal · coronal · 1.00mm/px · 3 of 154 slices shown]
[im 52/154  soft-tissue]
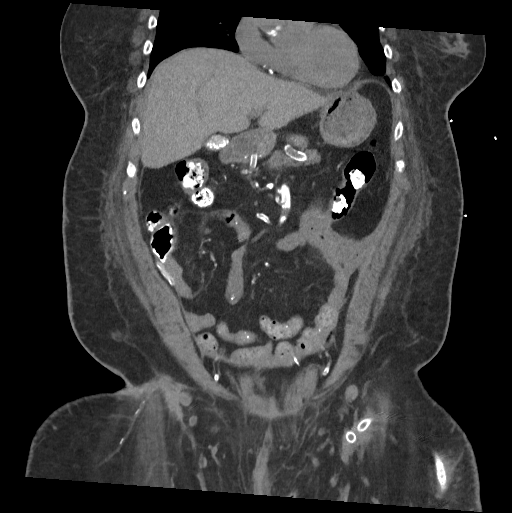
[im 69/154  soft-tissue]
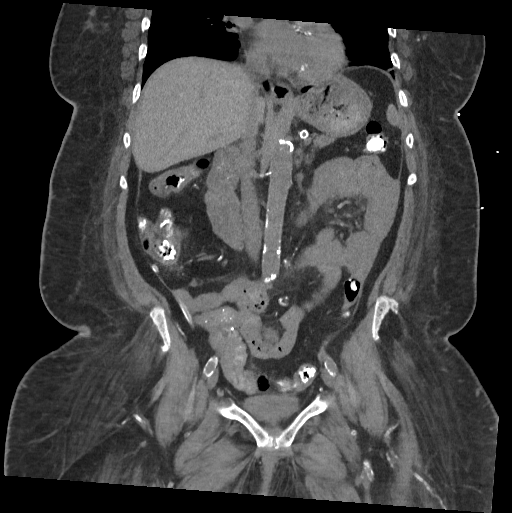
[im 86/154  soft-tissue]
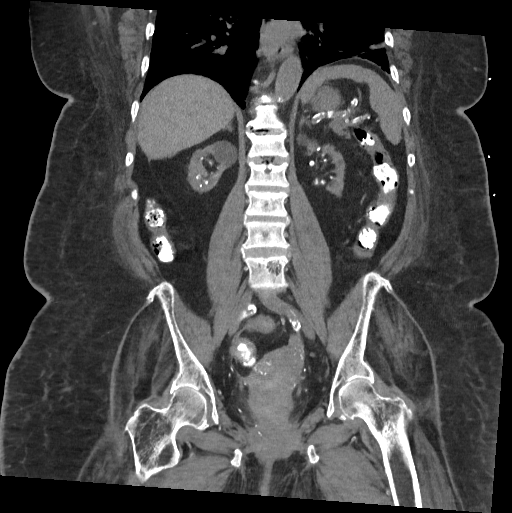

[16 of 46 positions shown; findings below may reference images not displayed]

FINDINGS: Lower chest: Mild atelectasis is seen within the left lung base.

Marked severity coronary artery calcification is noted.

Hepatobiliary: No focal liver abnormality is seen. Numerous
gallstones are seen within the gallbladder lumen. There is no
evidence of gallbladder wall thickening or biliary dilatation. A
trace amount of pericholecystic inflammation is noted.

Pancreas: Unremarkable. No pancreatic ductal dilatation or
surrounding inflammatory changes.

Spleen: Normal in size without focal abnormality.

Adrenals/Urinary Tract: Adrenal glands are unremarkable. Small
native kidneys are seen with multiple bilateral simple renal cysts
noted. The largest measures approximately 2.9 cm in diameter and is
seen within the anteromedial aspect of the mid to upper right
kidney.

4 mm and 6 mm calcifications are seen within the expected region of
the left UPJ (best seen on coronal reformatted image 84, CT series
5). A similar appearing 1.4 cm calcification is noted along the
expected region of the right UPJ (coronal reformatted image 80, CT
series 5).

The urinary bladder is empty and subsequently limited in evaluation.

Stomach/Bowel: There is a small hiatal hernia. Appendix appears
normal. No evidence of bowel wall thickening, distention, or
inflammatory changes.

Vascular/Lymphatic: Aortic atherosclerosis with marked severity
calcification of the arterial structures of abdomen and pelvis.
Bilateral common iliac artery stents are seen. With additional
postoperative grafts noted within the left inguinal region.

No enlarged abdominal or pelvic lymph nodes.

Reproductive: Uterus and bilateral adnexa are unremarkable.

Other: Surgical mesh is noted along the anterior at aspect of the
lower abdominal wall.

A catheter is seen looped within the right lower quadrant.

No abdominopelvic ascites.

Musculoskeletal: Multilevel degenerative changes seen throughout the
lumbar spine.
IMPRESSION: 1. Cholelithiasis with a trace amount of pericholecystic
inflammatory fat stranding. Further evaluation with a right upper
quadrant ultrasound or nuclear medicine hepatobiliary scan is
recommended, as early acute cholecystitis cannot be excluded.
2. Small native kidneys with multiple bilateral simple renal cysts.
3. Findings suspicious for bilateral proximal ureteral calculi, as
described above.
4. Small hiatal hernia.
5. Catheter within the lower abdomen which may represent a fractured
peritoneal dialysis catheter.
6. Aortic atherosclerosis.

Aortic Atherosclerosis ([id number]).

## 2023-02-08 ENCOUNTER — Telehealth (INDEPENDENT_AMBULATORY_CARE_PROVIDER_SITE_OTHER): Payer: Self-pay

## 2023-02-08 NOTE — Telephone Encounter (Signed)
Spoke with the patient and she is scheduled with Dr. Wyn Quaker on 02/15/23 with a 1:30 pm arrival time to the [facility] for a permcath exchange. Pre-procedure instructions were discussed and will be mailed.

## 2023-02-15 ENCOUNTER — Encounter
Admission: RE | Disposition: A | Discharge status: Home / Self Care | Payer: Self-pay | Source: Ambulatory Visit | Attending: Vascular Surgery

## 2023-02-15 ENCOUNTER — Encounter: Payer: Self-pay | Admitting: Vascular Surgery

## 2023-02-15 ENCOUNTER — Ambulatory Visit
Admission: RE | Admit: 2023-02-15 | Discharge: 2023-02-15 | Disposition: A | Discharge status: Home / Self Care | Payer: Medicare (Managed Care) | Source: Ambulatory Visit | Attending: Vascular Surgery | Admitting: Vascular Surgery

## 2023-02-15 ENCOUNTER — Other Ambulatory Visit: Payer: Self-pay

## 2023-02-15 DIAGNOSIS — N186 End stage renal disease: Secondary | ICD-10-CM | POA: Insufficient documentation

## 2023-02-15 DIAGNOSIS — Z992 Dependence on renal dialysis: Secondary | ICD-10-CM | POA: Insufficient documentation

## 2023-02-15 DIAGNOSIS — X58XXXA Exposure to other specified factors, initial encounter: Secondary | ICD-10-CM | POA: Insufficient documentation

## 2023-02-15 DIAGNOSIS — E1122 Type 2 diabetes mellitus with diabetic chronic kidney disease: Secondary | ICD-10-CM | POA: Insufficient documentation

## 2023-02-15 DIAGNOSIS — T859XXA Unspecified complication of internal prosthetic device, implant and graft, initial encounter: Secondary | ICD-10-CM | POA: Insufficient documentation

## 2023-02-15 DIAGNOSIS — Z89512 Acquired absence of left leg below knee: Secondary | ICD-10-CM | POA: Insufficient documentation

## 2023-02-15 DIAGNOSIS — I120 Hypertensive chronic kidney disease with stage 5 chronic kidney disease or end stage renal disease: Secondary | ICD-10-CM | POA: Insufficient documentation

## 2023-02-15 DIAGNOSIS — T8241XA Breakdown (mechanical) of vascular dialysis catheter, initial encounter: Secondary | ICD-10-CM | POA: Diagnosis not present

## 2023-02-15 DIAGNOSIS — I2510 Atherosclerotic heart disease of native coronary artery without angina pectoris: Secondary | ICD-10-CM | POA: Diagnosis not present

## 2023-02-15 HISTORY — PX: DIALYSIS/PERMA CATHETER INSERTION: CATH118288

## 2023-02-15 LAB — GLUCOSE, CAPILLARY
Glucose-Capillary: 56 mg/dL — ABNORMAL LOW (ref 70–99)
Glucose-Capillary: 102 mg/dL — ABNORMAL HIGH (ref 70–99)
Glucose-Capillary: 91 mg/dL (ref 70–99)

## 2023-02-15 LAB — POTASSIUM (ARMC VASCULAR LAB ONLY): Potassium (ARMC vascular lab): 4.8 mmol/L (ref 3.5–5.1)

## 2023-02-15 SURGERY — DIALYSIS/PERMA CATHETER INSERTION
Anesthesia: Moderate Sedation

## 2023-02-15 MED ORDER — HYDROMORPHONE HCL 1 MG/ML IJ SOLN: 1.0000 mg | Freq: Once | INTRAMUSCULAR | Status: DC | PRN

## 2023-02-15 MED ORDER — FAMOTIDINE 20 MG PO TABS
ORAL_TABLET | ORAL | Status: AC
  Administered 2023-02-15: 40 mg via ORAL
  Filled 2023-02-15: qty 2

## 2023-02-15 MED ORDER — FAMOTIDINE 20 MG PO TABS: 40.0000 mg | ORAL_TABLET | Freq: Once | ORAL | Status: AC | PRN

## 2023-02-15 MED ORDER — FENTANYL CITRATE (PF) 100 MCG/2ML IJ SOLN
INTRAMUSCULAR | Status: DC | PRN
  Administered 2023-02-15: 50 ug via INTRAVENOUS

## 2023-02-15 MED ORDER — DIPHENHYDRAMINE HCL 50 MG/ML IJ SOLN: 50.0000 mg | Freq: Once | INTRAMUSCULAR | Status: AC | PRN

## 2023-02-15 MED ORDER — METHYLPREDNISOLONE SODIUM SUCC 125 MG IJ SOLR
INTRAMUSCULAR | Status: AC
  Administered 2023-02-15: 125 mg via INTRAVENOUS
  Filled 2023-02-15: qty 2

## 2023-02-15 MED ORDER — DEXTROSE 50 % IV SOLN: 12.5000 g | Freq: Once | INTRAVENOUS | Status: AC

## 2023-02-15 MED ORDER — VANCOMYCIN HCL IN DEXTROSE 1-5 GM/200ML-% IV SOLN: 1000.0000 mg | INTRAVENOUS | Status: DC

## 2023-02-15 MED ORDER — MIDAZOLAM HCL 2 MG/ML PO SYRP: 8.0000 mg | ORAL_SOLUTION | Freq: Once | ORAL | Status: DC | PRN

## 2023-02-15 MED ORDER — METHYLPREDNISOLONE SODIUM SUCC 125 MG IJ SOLR: 125.0000 mg | Freq: Once | INTRAMUSCULAR | Status: AC | PRN

## 2023-02-15 MED ORDER — FENTANYL CITRATE (PF) 100 MCG/2ML IJ SOLN
INTRAMUSCULAR | Status: AC
  Filled 2023-02-15: qty 2

## 2023-02-15 MED ORDER — VANCOMYCIN HCL IN DEXTROSE 1-5 GM/200ML-% IV SOLN
INTRAVENOUS | Status: AC
  Filled 2023-02-15: qty 200

## 2023-02-15 MED ORDER — MIDAZOLAM HCL 2 MG/2ML IJ SOLN
INTRAMUSCULAR | Status: DC | PRN
  Administered 2023-02-15: 1 mg via INTRAVENOUS

## 2023-02-15 MED ORDER — DEXTROSE 50 % IV SOLN
INTRAVENOUS | Status: AC
  Administered 2023-02-15: 12.5 g via INTRAVENOUS
  Filled 2023-02-15: qty 50

## 2023-02-15 MED ORDER — HEPARIN SODIUM (PORCINE) 1000 UNIT/ML IJ SOLN
INTRAMUSCULAR | Status: AC
  Filled 2023-02-15: qty 10

## 2023-02-15 MED ORDER — HEPARIN SODIUM (PORCINE) 10000 UNIT/ML IJ SOLN
INTRAMUSCULAR | Status: AC
  Filled 2023-02-15: qty 1

## 2023-02-15 MED ORDER — MIDAZOLAM HCL 5 MG/5ML IJ SOLN
INTRAMUSCULAR | Status: AC
  Filled 2023-02-15: qty 5

## 2023-02-15 MED ORDER — DIPHENHYDRAMINE HCL 50 MG/ML IJ SOLN
INTRAMUSCULAR | Status: AC
  Administered 2023-02-15: 50 mg via INTRAVENOUS
  Filled 2023-02-15: qty 1

## 2023-02-15 MED ORDER — SODIUM CHLORIDE 0.9 % IV SOLN: INTRAVENOUS | Status: DC

## 2023-02-15 MED ORDER — ONDANSETRON HCL 4 MG/2ML IJ SOLN: 4.0000 mg | Freq: Four times a day (QID) | INTRAMUSCULAR | Status: DC | PRN

## 2023-02-15 SURGICAL SUPPLY — 5 items
CATH PALINDROME-P 44CM KIT (CATHETERS) ×1
KIT CATH CHRNC PALINDROME PRCS (CATHETERS) IMPLANT
SUT MNCRL AB 4-0 PS2 18 (SUTURE) IMPLANT
SUT PROLENE 0 CT 1 30 (SUTURE) IMPLANT
WIRE AMPLATZ SSTIFF .035X260CM (WIRE) IMPLANT

## 2023-02-15 NOTE — Op Note (Signed)
OPERATIVE NOTE    PRE-OPERATIVE DIAGNOSIS: 1. ESRD 2. Non-functional permcath  POST-OPERATIVE DIAGNOSIS: same as above  PROCEDURE: Fluoroscopic guidance for placement of catheter Placement of a 44 cm tip to cuff tunneled hemodialysis catheter via the right femoral vein and removal of previous catheter  SURGEON: Festus Barren, MD  ANESTHESIA:  Local with moderate conscious sedation for 13 minutes using 1 mg of Versed and 50 mcg of Fentanyl  ESTIMATED BLOOD LOSS: 3 cc  FINDING(S): none  SPECIMEN(S):  None  INDICATIONS:   Patient is a 72 y.o.female who presents with non-functional dialysis catheter and ESRD.  The patient needs long term dialysis access for their ESRD, and a Permcath is necessary.  Risks and benefits are discussed and informed consent is obtained.    DESCRIPTION: After obtaining full informed written consent, the patient was brought back to the vascular suite. The patient received moderate conscious sedation during a face-to-face encounter with me present throughout the entire procedure and supervising the RN monitoring the vital signs, pulse oximetry, telemetry, and mental status throughout the entire procedure. The patient's existing catheter, right thigh, and groin were sterilely prepped and draped in a sterile surgical field was created.  The existing catheter was dissected free from the fibrous sheath securing the cuff with hemostats and blunt dissection.  A wire was placed. The existing catheter was then removed and the wire used to keep venous access. I selected a 44 cm tip to cuff tunneled dialysis catheter.  Using fluoroscopic guidance the catheter tips were parked in the right atrium. The appropriate distal connectors were placed. It withdrew blood well and flushed easily with heparinized saline and a concentrated heparin solution was then placed. It was secured to the chest wall with 2 Prolene sutures. A 4-0 Monocryl pursestring suture was placed around the exit site.  Sterile dressings were placed. The patient tolerated the procedure well and was taken to the recovery room in stable condition.  COMPLICATIONS: None  CONDITION: Stable  Festus Barren 02/15/2023 3:44 PM   This note was created with Dragon Medical transcription system. Any errors in dictation are purely unintentional.

## 2023-02-15 NOTE — H&P (Signed)
[facility] Admission History & Physical  MRN : [id number]  Amy Wu is a 72 y.o. (Jun 27, 1950) female who presents with chief complaint of No chief complaint on file. Marland Kitchen  History of Present Illness:  I am asked to evaluate the patient by the dialysis center. The patient was sent here because they were unable to achieve adequate dialysis yesterday. Furthermore the Center states they were unable to aspirate either lumen of the catheter yesterday.  This problem has been getting worse for about 1 week. The patient is unaware of any other change.   Patient denies pain or tenderness overlying the access.  There is no pain with dialysis.  Patient denies fevers or shaking chills while on dialysis.    There have multiple any past interventions and declots of his multiple different access.  The patient is not chronically hypotensive on dialysis.   No current facility-administered medications for this encounter.     Past Surgical History:  Procedure Laterality Date   A/V SHUNT INTERVENTION Left 11/20/2019   Procedure: A/V SHUNT INTERVENTION (Left Thigh);  Surgeon: Annice Needy, MD;  Location: [facility] IN[facility];  Service: Cardiovascular;  Laterality: Left;   A/V SHUNTOGRAM Left 02/16/2017   Procedure: A/V Shuntogram;  Surgeon: Annice Needy, MD;  Location: [facility];  Service: Cardiovascular;  Laterality: Left;   A/V SHUNTOGRAM Left 12/13/2017   Procedure: A/V SHUNTOGRAM;  Surgeon: Annice Needy, MD;  Location: [facility];  Service: Cardiovascular;  Laterality: Left;   A/V SHUNTOGRAM Left 12/05/2018   Procedure: A/V SHUNTOGRAM;  Surgeon: Annice Needy, MD;  Location: [facility];  Service: Cardiovascular;  Laterality: Left;   A/V SHUNTOGRAM Left 06/03/2020   Procedure: A/V SHUNTOGRAM;  Surgeon: Annice Needy, MD;  Location: [facility];  Service: Cardiovascular;  Laterality: Left;   A/V SHUNTOGRAM Left 05/24/2021   Procedure: A/V  SHUNTOGRAM;  Surgeon: Renford Dills, MD;  Location: [facility];  Service: Cardiovascular;  Laterality: Left;  Thigh Graft   ABOVE KNEE LEG AMPUTATION     AV FISTULA PLACEMENT Bilateral    AV FISTULA PLACEMENT Left 07/01/2015   Procedure: INSERTION OF ARTERIOVENOUS (AV) GRAFT THIGH (ARTEGRAFT);  Surgeon: Annice Needy, MD;  Location: [facility];  Service: Vascular;  Laterality: Left;   BELOW KNEE LEG AMPUTATION Left    BREAST BIOPSY     CATARACT EXTRACTION W/PHACO Left 07/10/2022   Procedure: CATARACT EXTRACTION PHACO AND INTRAOCULAR LENS PLACEMENT (IOC) LEFT DIABETIC  12.98  01:13.7;  Surgeon: Nevada Crane, MD;  Location: [facility];  Service: Ophthalmology;  Laterality: Left;   DIALYSIS/PERMA CATHETER INSERTION N/A 05/26/2021   Procedure: DIALYSIS/PERMA CATHETER INSERTION;  Surgeon: Annice Needy, MD;  Location: [facility];  Service: Cardiovascular;  Laterality: N/A;   DIALYSIS/PERMA CATHETER INSERTION N/A 07/05/2022   Procedure: DIALYSIS/PERMA CATHETER INSERTION;  Surgeon: Annice Needy, MD;  Location: [facility];  Service: Cardiovascular;  Laterality: N/A;   DIALYSIS/PERMA CATHETER INSERTION N/A 07/28/2022   Procedure: DIALYSIS/PERMA CATHETER INSERTION;  Surgeon: Annice Needy, MD;  Location: [facility];  Service: Cardiovascular;  Laterality: N/A;   DIALYSIS/PERMA CATHETER INSERTION N/A 09/21/2022   Procedure: DIALYSIS/PERMA CATHETER INSERTION;  Surgeon: Annice Needy, MD;  Location: [facility];  Service: Cardiovascular;  Laterality: N/A;   ENDARTERECTOMY FEMORAL Left 07/01/2015   Procedure: SUPERFICIAL FEMORAL ENDARTERECTOMY ;  Surgeon: Annice Needy, MD;  Location: [facility];  Service: Vascular;  Laterality: Left;   EXCHANGE OF A DIALYSIS CATHETER  02/11/2015   Procedure: Exchange Of A Dialysis Catheter;  Surgeon: Annice Needy, MD;  Location: [facility];  Service: Cardiovascular;;   LEG AMPUTATION ABOVE KNEE Right    PERIPHERAL  VASCULAR CATHETERIZATION N/A 02/11/2015   Procedure: Dialysis/Perma Catheter Insertion;  Surgeon: Annice Needy, MD;  Location: [facility];  Service: Cardiovascular;  Laterality: N/A;   PERIPHERAL VASCULAR CATHETERIZATION N/A 05/11/2015   Procedure: Dialysis/Perma Catheter Insertion, exchange;  Surgeon: Renford Dills, MD;  Location: [facility];  Service: Cardiovascular;  Laterality: N/A;   PERIPHERAL VASCULAR CATHETERIZATION N/A 05/18/2015   Procedure: Dialysis/Perma Catheter Insertion;  Surgeon: Renford Dills, MD;  Location: [facility];  Service: Cardiovascular;  Laterality: N/A;   PERIPHERAL VASCULAR CATHETERIZATION Left 07/19/2015   Procedure: A/V Shuntogram/Fistulagram;  Surgeon: Annice Needy, MD;  Location: [facility];  Service: Cardiovascular;  Laterality: Left;   PERIPHERAL VASCULAR CATHETERIZATION N/A 07/19/2015   Procedure: A/V Shunt Intervention;  Surgeon: Annice Needy, MD;  Location: [facility];  Service: Cardiovascular;  Laterality: N/A;   PERIPHERAL VASCULAR CATHETERIZATION N/A 08/26/2015   Procedure: Dialysis/Perma Catheter Removal;  Surgeon: Annice Needy, MD;  Location: [facility];  Service: Cardiovascular;  Laterality: N/A;   PERIPHERAL VASCULAR THROMBECTOMY N/A 12/29/2016   Procedure: Peripheral Vascular Thrombectomy;  Surgeon: Renford Dills, MD;  Location: [facility];  Service: Cardiovascular;  Laterality: N/A;     Social History   Tobacco Use   Smoking status: Never   Smokeless tobacco: Never  Vaping Use   Vaping status: Never Used  Substance Use Topics   Alcohol use: No   Drug use: No     Family History  Problem Relation Age of Onset   Breast cancer Neg Hx     No family history of bleeding or clotting disorders, autoimmune disease or porphyria  Allergies  Allergen Reactions   Ancef [Cefazolin] Palpitations   Contrast Media [Iodinated Contrast Media] Palpitations     REVIEW OF SYSTEMS  (Negative unless checked)  Constitutional: [] Weight loss  [] Fever  [] Chills Cardiac: [] Chest pain   [] Chest pressure   [] Palpitations   [] Shortness of breath when laying flat   [] Shortness of breath at rest   [x] Shortness of breath with exertion. Vascular:  [] Pain in legs with walking   [] Pain in legs at rest   [] Pain in legs when laying flat   [] Claudication   [] Pain in feet when walking  [] Pain in feet at rest  [] Pain in feet when laying flat   [] History of DVT   [] Phlebitis   [] Swelling in legs   [] Varicose veins   [] Non-healing ulcers Pulmonary:   [] Uses home oxygen   [] Productive cough   [] Hemoptysis   [] Wheeze  [] COPD   [] Asthma Neurologic:  [] Dizziness  [] Blackouts   [] Seizures   [] History of stroke   [] History of TIA  [] Aphasia   [] Temporary blindness   [] Dysphagia   [] Weakness or numbness in arms   [] Weakness or numbness in legs Musculoskeletal:  [x] Arthritis   [] Joint swelling   [x] Joint pain   [] Low back pain Hematologic:  [] Easy bruising  [] Easy bleeding   [] Hypercoagulable state   [x] Anemic  [] Hepatitis Gastrointestinal:  [] Blood in stool   [] Vomiting blood  [] Gastroesophageal reflux/heartburn   [] Difficulty swallowing. Genitourinary:  [x] Chronic kidney disease   [] Difficult urination  [] Frequent  urination  [] Burning with urination   [] Blood in urine Skin:  [] Rashes   [] Ulcers   [] Wounds Psychological:  [] History of anxiety   []  History of major depression.  Physical Examination  There were no vitals filed for this visit. There is no height or weight on file to calculate BMI. Gen: WD/WN, NAD Head: [place]/AT, No temporalis wasting.  Ear/Nose/Throat: Hearing grossly intact, nares w/o erythema or drainage, oropharynx w/o Erythema/Exudate,  Eyes: Conjunctiva clear, sclera non-icteric Neck: Trachea midline.  No JVD.  Pulmonary:  Good air movement, respirations not labored, no use of accessory muscles.  Cardiac: RRR, normal S1, S2. Vascular: right femoral tunneled catheter without  tenderness or drainage Vessel Right Left  Radial Palpable Palpable  Gastrointestinal: soft, non-tender/non-distended. No guarding/reflex.  Musculoskeletal: M/S 5/5 throughout.  Bilateral LE amputations Neurologic: Sensation grossly intact in extremities.  Symmetrical.  Speech is fluent. Motor exam as listed above. Psychiatric: Judgment intact, Mood & affect appropriate for pt's clinical situation. Dermatologic: No rashes or ulcers noted.  No cellulitis or open wounds.    CBC Lab Results  Component Value Date   WBC 8.0 05/24/2021   HGB 9.3 (L) 05/24/2021   HCT 27.8 (L) 05/24/2021   MCV 79.4 (L) 05/24/2021   PLT 198 05/24/2021    BMET    Component Value Date/Time   NA 138 05/26/2021 0508   NA 138 12/03/2012 1414   K 3.7 07/10/2022 0950   K 4.5 12/03/2012 1414   CL 96 (L) 05/26/2021 0508   CL 101 12/03/2012 1414   CO2 30 05/26/2021 0508   CO2 27 12/03/2012 1414   GLUCOSE 204 (H) 05/26/2021 0508   GLUCOSE 326 (H) 12/03/2012 1414   BUN 51 (H) 05/26/2021 0508   BUN 37 (H) 12/03/2012 1414   CREATININE 8.86 (H) 05/26/2021 0508   CREATININE 7.04 (H) 12/03/2012 1414   CALCIUM 9.2 05/26/2021 0508   CALCIUM 8.4 (L) 12/03/2012 1414   GFRNONAA 4 (L) 05/26/2021 0508   GFRNONAA 6 (L) 12/03/2012 1414   GFRAA 9 (L) 11/19/2019 1002   GFRAA 7 (L) 12/03/2012 1414   CrCl cannot be calculated (Patient's most recent lab result is older than the maximum 21 days allowed.).  COAG Lab Results  Component Value Date   INR 1.1 05/23/2021   INR 1.0 11/19/2019   INR 1.11 06/22/2015    Radiology No results found.  Assessment/Plan 1.  Complication dialysis device with nonfunctional AV access:  Patient's tunneled catheter is thrombosed. The patient will undergo exchange of the catheter same venous access using interventional techniques.  The risks and benefits were described to the patient.  All questions were answered.  The patient agrees to proceed with intervention.  2.  End-stage renal  disease requiring hemodialysis:  Patient will continue dialysis therapy without further interruption  3.  Hypertension:  Patient will continue medical management; nephrology is following no changes in oral medications. 4. Diabetes mellitus:  Glucose will be monitored and oral medications been held this morning once the patient has undergone the patient's procedure po intake will be reinitiated and again Accu-Cheks will be used to assess the blood glucose level and treat as needed. The patient will be restarted on the patient's usual hypoglycemic regime 5.  Coronary artery disease:  EKG will be monitored. Nitrates will be used if needed. The patient's oral cardiac medications will be continued.    Festus Barren, MD  02/15/2023 1:26 PM

## 2023-02-16 ENCOUNTER — Encounter: Payer: Self-pay | Admitting: Vascular Surgery

## 2023-02-16 MED ORDER — HEPARIN (PORCINE) IN NACL 1000-0.9 UT/500ML-% IV SOLN
INTRAVENOUS | Status: AC | PRN
  Administered 2023-02-15: 500 mL

## 2023-02-16 MED ORDER — HEPARIN SODIUM (PORCINE) 10000 UNIT/ML IJ SOLN
INTRAMUSCULAR | Status: AC | PRN
  Administered 2023-02-15: 10000 [IU]

## 2023-02-16 MED ORDER — LIDOCAINE-EPINEPHRINE (PF) 1 %-1:200000 IJ SOLN
INTRAMUSCULAR | Status: AC | PRN
  Administered 2023-02-15: 20 mL

## 2023-04-09 ENCOUNTER — Ambulatory Visit
Admission: EM | Admit: 2023-04-09 | Discharge: 2023-04-09 | Disposition: A | Discharge status: Home / Self Care | Payer: Medicare (Managed Care) | Attending: Emergency Medicine | Admitting: Emergency Medicine

## 2023-04-09 ENCOUNTER — Encounter
Admission: EM | Disposition: A | Discharge status: Home / Self Care | Payer: Self-pay | Source: Home / Self Care | Attending: Emergency Medicine

## 2023-04-09 ENCOUNTER — Other Ambulatory Visit (INDEPENDENT_AMBULATORY_CARE_PROVIDER_SITE_OTHER): Payer: Self-pay | Admitting: Nurse Practitioner

## 2023-04-09 ENCOUNTER — Other Ambulatory Visit: Payer: Self-pay

## 2023-04-09 DIAGNOSIS — E1122 Type 2 diabetes mellitus with diabetic chronic kidney disease: Secondary | ICD-10-CM | POA: Diagnosis not present

## 2023-04-09 DIAGNOSIS — T8249XA Other complication of vascular dialysis catheter, initial encounter: Secondary | ICD-10-CM | POA: Diagnosis not present

## 2023-04-09 DIAGNOSIS — Y839 Surgical procedure, unspecified as the cause of abnormal reaction of the patient, or of later complication, without mention of misadventure at the time of the procedure: Secondary | ICD-10-CM | POA: Insufficient documentation

## 2023-04-09 DIAGNOSIS — Z7984 Long term (current) use of oral hypoglycemic drugs: Secondary | ICD-10-CM | POA: Diagnosis not present

## 2023-04-09 DIAGNOSIS — Z992 Dependence on renal dialysis: Secondary | ICD-10-CM | POA: Insufficient documentation

## 2023-04-09 DIAGNOSIS — T8241XA Breakdown (mechanical) of vascular dialysis catheter, initial encounter: Secondary | ICD-10-CM | POA: Diagnosis present

## 2023-04-09 DIAGNOSIS — N186 End stage renal disease: Secondary | ICD-10-CM | POA: Diagnosis not present

## 2023-04-09 HISTORY — PX: DIALYSIS/PERMA CATHETER INSERTION: CATH118288

## 2023-04-09 LAB — CBC WITH DIFFERENTIAL/PLATELET
Abs Immature Granulocytes: 0.03 10*3/uL (ref 0.00–0.07)
Basophils Absolute: 0.1 10*3/uL (ref 0.0–0.1)
Basophils Relative: 1 %
Eosinophils Absolute: 0.7 10*3/uL — ABNORMAL HIGH (ref 0.0–0.5)
Eosinophils Relative: 8 %
HCT: 38.9 % (ref 36.0–46.0)
Hemoglobin: 12.4 g/dL (ref 12.0–15.0)
Immature Granulocytes: 0 %
Lymphocytes Relative: 21 %
Lymphs Abs: 1.7 10*3/uL (ref 0.7–4.0)
MCH: 25.5 pg — ABNORMAL LOW (ref 26.0–34.0)
MCHC: 31.9 g/dL (ref 30.0–36.0)
MCV: 79.9 fL — ABNORMAL LOW (ref 80.0–100.0)
Monocytes Absolute: 0.7 10*3/uL (ref 0.1–1.0)
Monocytes Relative: 8 %
Neutro Abs: 5.2 10*3/uL (ref 1.7–7.7)
Neutrophils Relative %: 62 %
Platelets: 195 10*3/uL (ref 150–400)
RBC: 4.87 MIL/uL (ref 3.87–5.11)
RDW: 16.1 % — ABNORMAL HIGH (ref 11.5–15.5)
WBC: 8.4 10*3/uL (ref 4.0–10.5)
nRBC: 0 % (ref 0.0–0.2)

## 2023-04-09 LAB — GLUCOSE, CAPILLARY: Glucose-Capillary: 114 mg/dL — ABNORMAL HIGH (ref 70–99)

## 2023-04-09 LAB — BASIC METABOLIC PANEL
Anion gap: 12 (ref 5–15)
BUN: 53 mg/dL — ABNORMAL HIGH (ref 8–23)
CO2: 24 mmol/L (ref 22–32)
Calcium: 9.6 mg/dL (ref 8.9–10.3)
Chloride: 103 mmol/L (ref 98–111)
Creatinine, Ser: 9.62 mg/dL — ABNORMAL HIGH (ref 0.44–1.00)
GFR, Estimated: 4 mL/min — ABNORMAL LOW (ref >60)
Glucose, Bld: 109 mg/dL — ABNORMAL HIGH (ref 70–99)
Potassium: 5.2 mmol/L — ABNORMAL HIGH (ref 3.5–5.1)
Sodium: 139 mmol/L (ref 135–145)

## 2023-04-09 SURGERY — DIALYSIS/PERMA CATHETER INSERTION
Anesthesia: Moderate Sedation

## 2023-04-09 MED ORDER — MIDAZOLAM HCL 2 MG/2ML IJ SOLN
INTRAMUSCULAR | Status: DC | PRN
  Administered 2023-04-09: 1 mg via INTRAVENOUS

## 2023-04-09 MED ORDER — HYDROMORPHONE HCL 1 MG/ML IJ SOLN: 1.0000 mg | Freq: Once | INTRAMUSCULAR | Status: DC | PRN

## 2023-04-09 MED ORDER — FAMOTIDINE 20 MG PO TABS: 40.0000 mg | ORAL_TABLET | Freq: Once | ORAL | Status: DC | PRN

## 2023-04-09 MED ORDER — FENTANYL CITRATE (PF) 100 MCG/2ML IJ SOLN
INTRAMUSCULAR | Status: DC | PRN
  Administered 2023-04-09: 50 ug via INTRAVENOUS

## 2023-04-09 MED ORDER — METHYLPREDNISOLONE SODIUM SUCC 125 MG IJ SOLR: 125.0000 mg | Freq: Once | INTRAMUSCULAR | Status: DC | PRN

## 2023-04-09 MED ORDER — MIDAZOLAM HCL 2 MG/2ML IJ SOLN
INTRAMUSCULAR | Status: AC
  Filled 2023-04-09: qty 2

## 2023-04-09 MED ORDER — ONDANSETRON HCL 4 MG/2ML IJ SOLN: 4.0000 mg | Freq: Four times a day (QID) | INTRAMUSCULAR | Status: DC | PRN

## 2023-04-09 MED ORDER — HEPARIN SODIUM (PORCINE) 10000 UNIT/ML IJ SOLN
INTRAMUSCULAR | Status: DC | PRN
  Administered 2023-04-09: 10000 [IU]

## 2023-04-09 MED ORDER — SODIUM CHLORIDE 0.9 % IV SOLN: INTRAVENOUS | Status: DC

## 2023-04-09 MED ORDER — DIPHENHYDRAMINE HCL 50 MG/ML IJ SOLN: 50.0000 mg | Freq: Once | INTRAMUSCULAR | Status: DC | PRN

## 2023-04-09 MED ORDER — MIDAZOLAM HCL 2 MG/ML PO SYRP: 8.0000 mg | ORAL_SOLUTION | Freq: Once | ORAL | Status: DC | PRN

## 2023-04-09 MED ORDER — VANCOMYCIN HCL IN DEXTROSE 1-5 GM/200ML-% IV SOLN
1000.0000 mg | INTRAVENOUS | Status: AC
  Administered 2023-04-09: 1000 mg via INTRAVENOUS

## 2023-04-09 MED ORDER — HEPARIN (PORCINE) IN NACL 1000-0.9 UT/500ML-% IV SOLN
INTRAVENOUS | Status: DC | PRN
  Administered 2023-04-09: 500 mL

## 2023-04-09 MED ORDER — FENTANYL CITRATE (PF) 100 MCG/2ML IJ SOLN
INTRAMUSCULAR | Status: AC
  Filled 2023-04-09: qty 2

## 2023-04-09 MED ORDER — LIDOCAINE HCL (PF) 1 % IJ SOLN
INTRAMUSCULAR | Status: DC | PRN
  Administered 2023-04-09: 10 mL via INTRADERMAL

## 2023-04-09 SURGICAL SUPPLY — 6 items
CATH PALINDROME-P 44CM KIT (CATHETERS) ×1
GUIDEWIRE SUPER STIFF .035X180 (WIRE) IMPLANT
KIT CATH CHRNC PALINDROME PRCS (CATHETERS) IMPLANT
PACK ANGIOGRAPHY (CUSTOM PROCEDURE TRAY) IMPLANT
SUT MNCRL AB 4-0 PS2 18 (SUTURE) IMPLANT
SUT SILK 0 FSL (SUTURE) IMPLANT

## 2023-04-09 NOTE — ED Triage Notes (Signed)
Pt went to dialysis this morning and catheter was slightly pulled out and unable to do dialysis. No bleeding. Pt sent to ED

## 2023-04-09 NOTE — H&P (Signed)
MRN : [id number]  Amy Wu is a 72 y.o. (November 27, 1950) female who presents with chief complaint of check access.  History of Present Illness: Amy Wu is a 72 year old female who presented to [facility] due to having a right femoral dialysis catheter that dislodged and displaced.  She typically undergoes dialysis on Monday Wednesday and Friday.  She did not have any bleeding or any other acute medical complaints.  Her most recent catheter was placed on February 15, 2023.  Upon evaluation her catheter was completely dislodged with the cuff exposed.  Current Meds  Medication Sig   acetaminophen (TYLENOL) 500 MG tablet Take 1,000 mg by mouth every 8 (eight) hours as needed for moderate pain.   aspirin EC 81 MG tablet Take 81 mg by mouth daily.   atorvastatin (LIPITOR) 40 MG tablet Take 40 mg by mouth daily.   calcitRIOL (ROCALTROL) 0.25 MCG capsule Take 0.25 mcg by mouth daily. Take 3 capsules by mouth three times week at dialysis   cinacalcet (SENSIPAR) 30 MG tablet Take 30 mg by mouth daily with supper.    gabapentin (NEURONTIN) 100 MG capsule Take 100 mg by mouth 3 (three) times daily. M/W/F after dialysis   JANUVIA 25 MG tablet Take 25 mg by mouth daily.   lanthanum (FOSRENOL) 1000 MG chewable tablet Chew 1,000 mg by mouth 2 (two) times daily with a meal.     Past Medical History:  Diagnosis Date   Arthritis    Chronic kidney disease    Complication of anesthesia    Diabetes mellitus without complication (HCC)    Hyperlipidemia    Peripheral vascular disease (HCC)     Past Surgical History:  Procedure Laterality Date   A/V SHUNT INTERVENTION Left 11/20/2019   Procedure: A/V SHUNT INTERVENTION (Left Thigh);  Surgeon: Annice Needy, MD;  Location: [facility];  Service: Cardiovascular;  Laterality: Left;   A/V SHUNTOGRAM Left 02/16/2017   Procedure: A/V Shuntogram;  Surgeon: Annice Needy, MD;  Location: [facility];   Service: Cardiovascular;  Laterality: Left;   A/V SHUNTOGRAM Left 12/13/2017   Procedure: A/V SHUNTOGRAM;  Surgeon: Annice Needy, MD;  Location: [facility];  Service: Cardiovascular;  Laterality: Left;   A/V SHUNTOGRAM Left 12/05/2018   Procedure: A/V SHUNTOGRAM;  Surgeon: Annice Needy, MD;  Location: [facility];  Service: Cardiovascular;  Laterality: Left;   A/V SHUNTOGRAM Left 06/03/2020   Procedure: A/V SHUNTOGRAM;  Surgeon: Annice Needy, MD;  Location: [facility];  Service: Cardiovascular;  Laterality: Left;   A/V SHUNTOGRAM Left 05/24/2021   Procedure: A/V SHUNTOGRAM;  Surgeon: Renford Dills, MD;  Location: [facility];  Service: Cardiovascular;  Laterality: Left;  Thigh Graft   ABOVE KNEE LEG AMPUTATION     AV FISTULA PLACEMENT Bilateral    AV FISTULA PLACEMENT Left 07/01/2015   Procedure: INSERTION OF ARTERIOVENOUS (AV) GRAFT THIGH (ARTEGRAFT);  Surgeon: Annice Needy, MD;  Location: [facility];  Service: Vascular;  Laterality: Left;   BELOW KNEE LEG AMPUTATION Left    BREAST BIOPSY     CATARACT EXTRACTION W/PHACO Left 07/10/2022   Procedure: CATARACT EXTRACTION PHACO AND INTRAOCULAR LENS PLACEMENT (IOC) LEFT DIABETIC  12.98  01:13.7;  Surgeon: Nevada Crane, MD;  Location: [facility];  Service: Ophthalmology;  Laterality: Left;  DIALYSIS/PERMA CATHETER INSERTION N/A 05/26/2021   Procedure: DIALYSIS/PERMA CATHETER INSERTION;  Surgeon: Annice Needy, MD;  Location: [facility];  Service: Cardiovascular;  Laterality: N/A;   DIALYSIS/PERMA CATHETER INSERTION N/A 07/05/2022   Procedure: DIALYSIS/PERMA CATHETER INSERTION;  Surgeon: Annice Needy, MD;  Location: [facility];  Service: Cardiovascular;  Laterality: N/A;   DIALYSIS/PERMA CATHETER INSERTION N/A 07/28/2022   Procedure: DIALYSIS/PERMA CATHETER INSERTION;  Surgeon: Annice Needy, MD;  Location: [facility];  Service: Cardiovascular;  Laterality: N/A;    DIALYSIS/PERMA CATHETER INSERTION N/A 09/21/2022   Procedure: DIALYSIS/PERMA CATHETER INSERTION;  Surgeon: Annice Needy, MD;  Location: [facility];  Service: Cardiovascular;  Laterality: N/A;   DIALYSIS/PERMA CATHETER INSERTION N/A 02/15/2023   Procedure: DIALYSIS/PERMA CATHETER INSERTION;  Surgeon: Annice Needy, MD;  Location: [facility];  Service: Cardiovascular;  Laterality: N/A;   ENDARTERECTOMY FEMORAL Left 07/01/2015   Procedure: SUPERFICIAL FEMORAL ENDARTERECTOMY ;  Surgeon: Annice Needy, MD;  Location: [facility];  Service: Vascular;  Laterality: Left;   EXCHANGE OF A DIALYSIS CATHETER  02/11/2015   Procedure: Exchange Of A Dialysis Catheter;  Surgeon: Annice Needy, MD;  Location: [facility];  Service: Cardiovascular;;   LEG AMPUTATION ABOVE KNEE Right    PERIPHERAL VASCULAR CATHETERIZATION N/A 02/11/2015   Procedure: Dialysis/Perma Catheter Insertion;  Surgeon: Annice Needy, MD;  Location: [facility];  Service: Cardiovascular;  Laterality: N/A;   PERIPHERAL VASCULAR CATHETERIZATION N/A 05/11/2015   Procedure: Dialysis/Perma Catheter Insertion, exchange;  Surgeon: Renford Dills, MD;  Location: [facility];  Service: Cardiovascular;  Laterality: N/A;   PERIPHERAL VASCULAR CATHETERIZATION N/A 05/18/2015   Procedure: Dialysis/Perma Catheter Insertion;  Surgeon: Renford Dills, MD;  Location: [facility];  Service: Cardiovascular;  Laterality: N/A;   PERIPHERAL VASCULAR CATHETERIZATION Left 07/19/2015   Procedure: A/V Shuntogram/Fistulagram;  Surgeon: Annice Needy, MD;  Location: [facility];  Service: Cardiovascular;  Laterality: Left;   PERIPHERAL VASCULAR CATHETERIZATION N/A 07/19/2015   Procedure: A/V Shunt Intervention;  Surgeon: Annice Needy, MD;  Location: [facility];  Service: Cardiovascular;  Laterality: N/A;   PERIPHERAL VASCULAR CATHETERIZATION N/A 08/26/2015   Procedure: Dialysis/Perma Catheter Removal;  Surgeon:  Annice Needy, MD;  Location: [facility];  Service: Cardiovascular;  Laterality: N/A;   PERIPHERAL VASCULAR THROMBECTOMY N/A 12/29/2016   Procedure: Peripheral Vascular Thrombectomy;  Surgeon: Renford Dills, MD;  Location: [facility];  Service: Cardiovascular;  Laterality: N/A;    Social History Social History   Tobacco Use   Smoking status: Never   Smokeless tobacco: Never  Vaping Use   Vaping status: Never Used  Substance Use Topics   Alcohol use: No   Drug use: No    Family History Family History  Problem Relation Age of Onset   Breast cancer Neg Hx     Allergies  Allergen Reactions   Ancef [Cefazolin] Palpitations   Contrast Media [Iodinated Contrast Media] Palpitations     REVIEW OF SYSTEMS (Negative unless checked)  Constitutional: [] Weight loss  [] Fever  [] Chills Cardiac: [] Chest pain   [] Chest pressure   [] Palpitations   [] Shortness of breath when laying flat   [] Shortness of breath with exertion. Vascular:  [] Pain in legs with walking   [] Pain in legs at rest  [] History of DVT   [] Phlebitis   [] Swelling in legs   [] Varicose veins   []   Non-healing ulcers Pulmonary:   [] Uses home oxygen   [] Productive cough   [] Hemoptysis   [] Wheeze  [] COPD   [] Asthma Neurologic:  [] Dizziness   [] Seizures   [] History of stroke   [] History of TIA  [] Aphasia   [] Vissual changes   [] Weakness or numbness in arm   [] Weakness or numbness in leg Musculoskeletal:   [] Joint swelling   [] Joint pain   [] Low back pain Hematologic:  [] Easy bruising  [] Easy bleeding   [] Hypercoagulable state   [] Anemic Gastrointestinal:  [] Diarrhea   [] Vomiting  [] Gastroesophageal reflux/heartburn   [] Difficulty swallowing. Genitourinary:  [x] Chronic kidney disease   [] Difficult urination  [] Frequent urination   [] Blood in urine Skin:  [] Rashes   [] Ulcers  Psychological:  [] History of anxiety   []  History of major depression.  Physical Examination  Vitals:   04/09/23 1131 04/09/23 1132  04/09/23 1535 04/09/23 1625  BP: (!) 124/103   (!) 164/107  Pulse: 74   72  Resp: 18   18  Temp: 98.2 F (36.8 C)  98 F (36.7 C) 98.1 F (36.7 C)  TempSrc: Oral  Oral Oral  SpO2: 96%   95%  Weight:  95.3 kg  98.4 kg  Height:  4\' 9"  (1.448 m)  4\' 9"  (1.448 m)   Body mass index is 46.96 kg/m. Gen: WD/WN, NAD Head: [place]/AT, No temporalis wasting.  Ear/Nose/Throat: Hearing grossly intact, nares w/o erythema or drainage Eyes: PER, EOMI, sclera nonicteric.  Neck: Supple, no gross masses or lesions.  No JVD.  Pulmonary:  Good air movement, no audible wheezing, no use of accessory muscles.  Cardiac: RRR, precordium non-hyperdynamic. Vascular: Bilateral amputation, on the right leg is amputated above knee and the left below-knee  Exposed femoral dialysis catheter Gastrointestinal: soft, non-distended. No guarding/no peritoneal signs.  Musculoskeletal: M/S 5/5 throughout.  No deformity.  Neurologic: CN 2-12 intact. Pain and light touch intact in extremities.  Symmetrical.  Speech is fluent. Motor exam as listed above. Psychiatric: Judgment intact, Mood & affect appropriate for pt's clinical situation. Dermatologic: No rashes or ulcers noted.  No changes consistent with cellulitis.   CBC Lab Results  Component Value Date   WBC 8.0 05/24/2021   HGB 9.3 (L) 05/24/2021   HCT 27.8 (L) 05/24/2021   MCV 79.4 (L) 05/24/2021   PLT 198 05/24/2021    BMET    Component Value Date/Time   NA 138 05/26/2021 0508   NA 138 12/03/2012 1414   K 3.7 07/10/2022 0950   K 4.5 12/03/2012 1414   CL 96 (L) 05/26/2021 0508   CL 101 12/03/2012 1414   CO2 30 05/26/2021 0508   CO2 27 12/03/2012 1414   GLUCOSE 204 (H) 05/26/2021 0508   GLUCOSE 326 (H) 12/03/2012 1414   BUN 51 (H) 05/26/2021 0508   BUN 37 (H) 12/03/2012 1414   CREATININE 8.86 (H) 05/26/2021 0508   CREATININE 7.04 (H) 12/03/2012 1414   CALCIUM 9.2 05/26/2021 0508   CALCIUM 8.4 (L) 12/03/2012 1414   GFRNONAA 4 (L) 05/26/2021 0508    GFRNONAA 6 (L) 12/03/2012 1414   GFRAA 9 (L) 11/19/2019 1002   GFRAA 7 (L) 12/03/2012 1414   CrCl cannot be calculated (Patient's most recent lab result is older than the maximum 21 days allowed.).  COAG Lab Results  Component Value Date   INR 1.1 05/23/2021   INR 1.0 11/19/2019   INR 1.11 06/22/2015    Radiology No results found.   Assessment/Plan End-stage renal disease.  We will do  a PermCath exchange for the patient today.  Given that she has no other longstanding medical issues we will plan on discharge post intervention provided there are no complications or contraindications post procedurally.   Georgiana Spinner, NP  04/09/2023 4:46 PM

## 2023-04-09 NOTE — Op Note (Signed)
OPERATIVE NOTE   PROCEDURE: Insertion of tunneled dialysis catheter right femoral approach same venous access.  PRE-OPERATIVE DIAGNOSIS: Nonfunction of existing tunneled dialysis catheter, and stage renal disease requiring hemodialysis   POST-OPERATIVE DIAGNOSIS: Same   SURGEON: [name]  ANESTHESIA: Conscious sedation was administered under my direct supervision by the interventional radiology RN.  IV Versed plus fentanyl were utilized. Continuous ECG, pulse oximetry and blood pressure was monitored throughout the entire procedure.  Conscious sedation was for a total of 18 minutes.  ESTIMATED BLOOD LOSS: Less than 5 cc  CONTRAST USED:  None  FLUOROSCOPY TIME: 0.1 minutes  INDICATIONS:   Amy Wu is a 72 y.o.y.o. female who presents with poor flow and nonfunction of the tunneled dialysis catheter.  Adequate dialysis has not been possible.  DESCRIPTION: After obtaining full informed written consent, the patient was positioned supine. The right groin and thigh was prepped and draped in a sterile fashion. The cuff is localized and using blunt and sharp dissection it is freed from the surrounding adhesions.  The existing catheter is then transected proximal to the cuff.  The guidewire is advanced without difficulty under fluoroscopy.  Dilators are passed over the wire as needed and the tunneled dialysis catheter is fed into the central venous system without difficulty.  Under fluoroscopy the catheter tip positioned at the atrial caval junction.  Both lumens aspirate and flush easily. After verification of smooth contour with proper tip position under fluoroscopy the catheter is packed with 5000 units of heparin per lumen.  Catheter secured to the skin of the right thigh with 0 silk. A sterile dressing is applied with a Biopatch.  COMPLICATIONS: None  CONDITION: Good  [name] [facility] Office:  [phone]   04/09/2023,5:39 PM

## 2023-04-09 NOTE — Interval H&P Note (Signed)
History and Physical Interval Note:  04/09/2023 5:16 PM  Amy Wu  has presented today for surgery, with the diagnosis of ESRD.  The various methods of treatment have been discussed with the patient and family. After consideration of risks, benefits and other options for treatment, the patient has consented to  Procedure(s): DIALYSIS/PERMA CATHETER INSERTION (N/A) as a surgical intervention.  The patient's history has been reviewed, patient examined, no change in status, stable for surgery.  I have reviewed the patient's chart and labs.  Questions were answered to the patient's satisfaction.     [name]

## 2023-04-09 NOTE — Discharge Instructions (Signed)
Tunneled Catheter Insertion, Care After The following information offers guidance on how to care for yourself after your procedure. Your health care provider may also give you more specific instructions. If you have problems or questions, contact your health care provider. What can I expect after the procedure? After the procedure, it is common to have: Some mild redness, bruising, swelling, and pain around your catheter site. A small amount of blood or clear fluid coming from your incisions. Follow these instructions at home: Medicines Take over-the-counter and prescription medicines only as told by your health care provider. If you were prescribed an antibiotic medicine, take it as told by your health care provider. Do not stop taking the antibiotic even if you start to feel better. Incision care  Follow instructions from your health care provider about how to take care of your incisions. Make sure you: The dialysis center will change the dressing that covers your perm cath for dialysis. The skin glue on the site will gradually wear off, do not attempt to peel them off.  You need to avoid getting the area of your perm cath catheter clean and dry, so avoid showers, swimming, etc.  Check your incision areas every day for signs of infection.    Check for: More redness, swelling, or pain. More fluid or blood. Warmth. Pus or a bad smell. Catheter care  Keep your catheter site clean and dry. Leave thecaps on the ends of the catheter when not in use. Do not pull on your catheter. Activity Return to your normal activities as told by your health care provider. Ask your health care provider what activities are safe for you. Follow any other activity restrictions as instructed by your health care provider. Do not lift anything that is heavier than 10 lb (4.5 kg), or the limit that you are told, until your health care provider says that it is safe. Driving Do not drive for [id number] Ask your  health care provider if the medicine prescribed to you requires you to avoid driving or using machinery. General instructions Follow your health care provider's specific instructions for the type of catheter that you have. Do not take baths, swim, or use a hot tub until your health care provider approves. Ask your health care provider if you may take showers. Keep all follow-up visits. This is important. Contact a health care provider if: You feel unusually weak or nauseous. You have a fever or chills. You have more redness, swelling, or pain at your incisions or around the area where your catheter has been inserted or where it exits. You have pus or a bad smell coming from your catheter site. Your catheter site feels warm to the touch. Your catheter is not working properly. Fluid is leaking from the catheter, under the dressing, or around the dressing. You are unable to flush your catheter. Get help right away if: Your catheter develops a hole or it breaks. Your catheter comes loose or gets pulled completely out. If this happens, press on your catheter site firmly with a clean cloth until you can get medical help. You develop bleeding from your catheter or your insertion site, and your bleeding does not stop. You have swelling in your shoulder, neck, chest, or face. You have pain or swelling when fluids or medicines are being given through the catheter. You have chest pain or difficulty breathing. These symptoms may represent a serious problem that is an emergency. Do not wait to see if the symptoms will go away. Get  medical help right away. Call your local emergency services (911 in the [place] Do not drive yourself to the hospital. Summary After the procedure, it is common to have mild redness, swelling, and pain around your catheter site. Return to your normal activities as told by your health care provider. Ask your health care provider what activities are safe for you. Follow your  health care provider's specific instructions for the type of catheter that you have. Keep your catheter site and your dressings clean and dry. Contact a health care provider if your catheter is not working properly. Get help right away if you have chest pain, difficulty breathing, or your catheter comes loose or gets pulled completely out. This information is not intended to replace advice given to you by your health care provider. Make sure you discuss any questions you have with your health care provider. Document Revised: 10/11/2020 Document Reviewed: 10/11/2020 Elsevier Patient Education  2023 [employer].

## 2023-04-09 NOTE — ED Provider Triage Note (Signed)
Emergency Medicine Provider Triage Evaluation Note  Amy Wu , a 72 y.o. female  was evaluated in triage.  Pt complains of dialysis catheter falling out.  Review of Systems  Positive:  Negative:   Physical Exam  There were no vitals taken for this visit. Gen:   Awake, no distress   Resp:  Normal effort  MSK:   Moves extremities without difficulty  Other:    Medical Decision Making  Medically screening exam initiated at 11:30 AM.  Appropriate orders placed.  ARTURO GUTERMUTH was informed that the remainder of the evaluation will be completed by another provider, this initial triage assessment does not replace that evaluation, and the importance of remaining in the ED until their evaluation is complete.     Faythe Ghee, PA-C 04/09/23 1131

## 2023-04-09 NOTE — ED Provider Notes (Signed)
[facility] Provider Note    Event Date/Time   First MD Initiated Contact with Patient 04/09/23 1428     (approximate)   History   Vascular Access Problem   HPI  Amy Wu is a 72 y.o. female   Past medical history of end-stage renal disease with right femoral dialysis catheter undergoing hemodialysis on Monday Wednesday Friday who presents emergency department with dislodgment and displacement of the hemodialysis catheter.  Last session was on Friday uncomplicated.  Sometime over the weekend and her catheter must become dislodged because today at dialysis they noted that it been displaced about 2 inches.  She has no pain or bleeding or any other acute medical complaints.  They did not do the session and sent her to the emergency department for evaluation of the catheter.  External Medical Documents Reviewed: Dr. Driscilla Grammes vascular surgery note from August 2024 for placement of the right femoral hemodialysis catheter      Physical Exam   Triage Vital Signs: ED Triage Vitals  Encounter Vitals Group     BP 04/09/23 1131 (!) 124/103     Systolic BP Percentile --      Diastolic BP Percentile --      Pulse Rate 04/09/23 1131 74     Resp 04/09/23 1131 18     Temp 04/09/23 1131 98.2 F (36.8 C)     Temp Source 04/09/23 1131 Oral     SpO2 04/09/23 1131 96 %     Weight 04/09/23 1132 210 lb 1.6 oz (95.3 kg)     Height 04/09/23 1132 4\' 9"  (1.448 m)     Head Circumference --      Peak Flow --      Pain Score 04/09/23 1132 0     Pain Loc --      Pain Education --      Exclude from Growth Chart --     Most recent vital signs: Vitals:   04/09/23 1131  BP: (!) 124/103  Pulse: 74  Resp: 18  Temp: 98.2 F (36.8 C)  SpO2: 96%    General: Awake, no distress.  CV:  Good peripheral perfusion.  Resp:  Normal effort.  Abd:  No distention.  Other:  Pleasant woman in no acute distress.  She does have a femoral catheter in place on the right side, and  the site is clean dry and intact no active bleeding no masses no swelling no pain to palpation.   ED Results / Procedures / Treatments   Labs (all labs ordered are listed, but only abnormal results are displayed) Labs Reviewed  BASIC METABOLIC PANEL  CBC WITH DIFFERENTIAL/PLATELET    PROCEDURES:  Critical Care performed: No  Procedures   MEDICATIONS ORDERED IN ED: Medications - No data to display  External physician / consultants:  I spoke with Dr. Gilda Crease vascular surgery regarding care plan for this patient.   IMPRESSION / MDM / ASSESSMENT AND PLAN / ED COURSE  I reviewed the triage vital signs and the nursing notes.                                Patient's presentation is most consistent with acute presentation with potential threat to life or bodily function.  Differential diagnosis includes, but is not limited to, displaced hemodialysis catheter   The patient is on the cardiac monitor to evaluate for evidence of arrhythmia and/or significant heart  rate changes.  MDM:    Displaced catheter consult with vascular surgery for evaluation.  Last session was Friday, no other acute medical complaints, no respiratory complaints, doubt need for emergent dialysis at this time.  Disposition pending vascular surgery recommendations.  --  Fortunately appears to be displaced enough to need replacement today.  N.p.o. since last night, remain NPO.  Basic labs ordered.  Will keep an emergency department until able to have procedure performed by vascular surgery.  Signed out to oncoming provider.      FINAL CLINICAL IMPRESSION(S) / ED DIAGNOSES   Final diagnoses:  Hemodialysis catheter dysfunction, initial encounter [facility])     Rx / DC Orders   ED Discharge Orders     None        Note:  This document was prepared using Dragon voice recognition software and may include unintentional dictation errors.    Pilar Jarvis, MD 04/09/23 [phone]

## 2023-04-10 ENCOUNTER — Encounter: Payer: Self-pay | Admitting: Vascular Surgery

## 2023-05-03 ENCOUNTER — Ambulatory Visit (INDEPENDENT_AMBULATORY_CARE_PROVIDER_SITE_OTHER): Payer: Medicare (Managed Care) | Admitting: Nurse Practitioner

## 2023-07-20 ENCOUNTER — Other Ambulatory Visit
Admission: RE | Admit: 2023-07-20 | Discharge: 2023-07-20 | Disposition: A | Discharge status: Home / Self Care | Payer: Medicare (Managed Care) | Source: Ambulatory Visit | Attending: Nephrology | Admitting: Nephrology

## 2023-07-20 DIAGNOSIS — N186 End stage renal disease: Secondary | ICD-10-CM | POA: Insufficient documentation

## 2023-07-23 ENCOUNTER — Other Ambulatory Visit
Admission: RE | Admit: 2023-07-23 | Discharge: 2023-07-23 | Disposition: A | Discharge status: Home / Self Care | Payer: Medicare (Managed Care) | Source: Ambulatory Visit | Attending: Nephrology | Admitting: Nephrology

## 2023-07-23 DIAGNOSIS — N186 End stage renal disease: Secondary | ICD-10-CM | POA: Diagnosis present

## 2023-07-23 LAB — POTASSIUM
Potassium: 7.5 mmol/L (ref 3.5–5.1)
Potassium: 3.3 mmol/L — ABNORMAL LOW (ref 3.5–5.1)

## 2023-08-20 ENCOUNTER — Other Ambulatory Visit: Payer: Self-pay | Admitting: Family Medicine

## 2023-08-20 DIAGNOSIS — Z1231 Encounter for screening mammogram for malignant neoplasm of breast: Secondary | ICD-10-CM

## 2023-09-20 ENCOUNTER — Ambulatory Visit
Admission: RE | Admit: 2023-09-20 | Discharge: 2023-09-20 | Disposition: A | Discharge status: Home / Self Care | Payer: Medicare (Managed Care) | Source: Ambulatory Visit | Attending: Family Medicine | Admitting: Family Medicine

## 2023-09-20 DIAGNOSIS — Z1231 Encounter for screening mammogram for malignant neoplasm of breast: Secondary | ICD-10-CM | POA: Diagnosis present

## 2023-09-24 ENCOUNTER — Encounter
Admission: EM | Disposition: A | Discharge status: Home / Self Care | Payer: Self-pay | Source: Home / Self Care | Attending: Emergency Medicine

## 2023-09-24 ENCOUNTER — Other Ambulatory Visit: Payer: Self-pay

## 2023-09-24 ENCOUNTER — Ambulatory Visit
Admission: EM | Admit: 2023-09-24 | Discharge: 2023-09-24 | Disposition: A | Discharge status: Home / Self Care | Payer: Medicare (Managed Care) | Attending: Emergency Medicine | Admitting: Emergency Medicine

## 2023-09-24 DIAGNOSIS — Z7982 Long term (current) use of aspirin: Secondary | ICD-10-CM

## 2023-09-24 DIAGNOSIS — Z794 Long term (current) use of insulin: Secondary | ICD-10-CM

## 2023-09-24 DIAGNOSIS — T8242XA Displacement of vascular dialysis catheter, initial encounter: Secondary | ICD-10-CM

## 2023-09-24 DIAGNOSIS — N186 End stage renal disease: Secondary | ICD-10-CM | POA: Insufficient documentation

## 2023-09-24 DIAGNOSIS — E1151 Type 2 diabetes mellitus with diabetic peripheral angiopathy without gangrene: Secondary | ICD-10-CM | POA: Insufficient documentation

## 2023-09-24 DIAGNOSIS — T82898A Other specified complication of vascular prosthetic devices, implants and grafts, initial encounter: Secondary | ICD-10-CM | POA: Insufficient documentation

## 2023-09-24 DIAGNOSIS — Y828 Other medical devices associated with adverse incidents: Secondary | ICD-10-CM | POA: Insufficient documentation

## 2023-09-24 DIAGNOSIS — Z79899 Other long term (current) drug therapy: Secondary | ICD-10-CM | POA: Diagnosis not present

## 2023-09-24 DIAGNOSIS — T8249XA Other complication of vascular dialysis catheter, initial encounter: Secondary | ICD-10-CM | POA: Diagnosis not present

## 2023-09-24 DIAGNOSIS — T82524A Displacement of infusion catheter, initial encounter: Secondary | ICD-10-CM | POA: Diagnosis not present

## 2023-09-24 DIAGNOSIS — Z992 Dependence on renal dialysis: Secondary | ICD-10-CM | POA: Insufficient documentation

## 2023-09-24 DIAGNOSIS — T829XXA Unspecified complication of cardiac and vascular prosthetic device, implant and graft, initial encounter: Secondary | ICD-10-CM

## 2023-09-24 DIAGNOSIS — E1122 Type 2 diabetes mellitus with diabetic chronic kidney disease: Secondary | ICD-10-CM | POA: Diagnosis not present

## 2023-09-24 HISTORY — PX: DIALYSIS/PERMA CATHETER REPAIR: CATH118293

## 2023-09-24 LAB — CBC WITH DIFFERENTIAL/PLATELET
Abs Immature Granulocytes: 0.03 10*3/uL (ref 0.00–0.07)
Basophils Absolute: 0 10*3/uL (ref 0.0–0.1)
Basophils Relative: 0 %
Eosinophils Absolute: 0.4 10*3/uL (ref 0.0–0.5)
Eosinophils Relative: 6 %
HCT: 34.4 % — ABNORMAL LOW (ref 36.0–46.0)
Hemoglobin: 11.1 g/dL — ABNORMAL LOW (ref 12.0–15.0)
Immature Granulocytes: 0 %
Lymphocytes Relative: 16 %
Lymphs Abs: 1.2 10*3/uL (ref 0.7–4.0)
MCH: 25.8 pg — ABNORMAL LOW (ref 26.0–34.0)
MCHC: 32.3 g/dL (ref 30.0–36.0)
MCV: 80 fL (ref 80.0–100.0)
Monocytes Absolute: 0.6 10*3/uL (ref 0.1–1.0)
Monocytes Relative: 8 %
Neutro Abs: 5.2 10*3/uL (ref 1.7–7.7)
Neutrophils Relative %: 70 %
Platelets: 150 10*3/uL (ref 150–400)
RBC: 4.3 MIL/uL (ref 3.87–5.11)
RDW: 18.9 % — ABNORMAL HIGH (ref 11.5–15.5)
WBC: 7.5 10*3/uL (ref 4.0–10.5)
nRBC: 0 % (ref 0.0–0.2)

## 2023-09-24 LAB — BASIC METABOLIC PANEL WITH GFR
Anion gap: 13 (ref 5–15)
BUN: 33 mg/dL — ABNORMAL HIGH (ref 8–23)
CO2: 24 mmol/L (ref 22–32)
Calcium: 9 mg/dL (ref 8.9–10.3)
Chloride: 105 mmol/L (ref 98–111)
Creatinine, Ser: 7.41 mg/dL — ABNORMAL HIGH (ref 0.44–1.00)
GFR, Estimated: 5 mL/min — ABNORMAL LOW (ref >60)
Glucose, Bld: 122 mg/dL — ABNORMAL HIGH (ref 70–99)
Potassium: 3 mmol/L — ABNORMAL LOW (ref 3.5–5.1)
Sodium: 142 mmol/L (ref 135–145)

## 2023-09-24 SURGERY — DIALYSIS/PERMA CATHETER REPAIR
Anesthesia: LOCAL | Laterality: Right

## 2023-09-24 MED ORDER — HEPARIN SODIUM (PORCINE) 10000 UNIT/ML IJ SOLN
INTRAMUSCULAR | Status: AC
  Filled 2023-09-24: qty 1

## 2023-09-24 MED ORDER — HEPARIN SODIUM (PORCINE) 10000 UNIT/ML IJ SOLN
INTRAMUSCULAR | Status: DC | PRN
  Administered 2023-09-24: 10000 [IU]

## 2023-09-24 MED ORDER — MIDAZOLAM HCL 5 MG/5ML IJ SOLN
INTRAMUSCULAR | Status: AC
  Filled 2023-09-24: qty 5

## 2023-09-24 MED ORDER — CEFAZOLIN SODIUM-DEXTROSE 2-4 GM/100ML-% IV SOLN
INTRAVENOUS | Status: AC
  Filled 2023-09-24: qty 100

## 2023-09-24 MED ORDER — FENTANYL CITRATE (PF) 100 MCG/2ML IJ SOLN
INTRAMUSCULAR | Status: AC
  Filled 2023-09-24: qty 2

## 2023-09-24 MED ORDER — FENTANYL CITRATE (PF) 100 MCG/2ML IJ SOLN
INTRAMUSCULAR | Status: DC | PRN
  Administered 2023-09-24: 50 ug via INTRAVENOUS

## 2023-09-24 MED ORDER — CEFAZOLIN SODIUM-DEXTROSE 2-4 GM/100ML-% IV SOLN
2.0000 g | Freq: Once | INTRAVENOUS | Status: AC
  Administered 2023-09-24: 2 g via INTRAVENOUS

## 2023-09-24 MED ORDER — LIDOCAINE-EPINEPHRINE (PF) 1 %-1:200000 IJ SOLN
INTRAMUSCULAR | Status: DC | PRN
  Administered 2023-09-24: 20 mL

## 2023-09-24 MED ORDER — HEPARIN (PORCINE) IN NACL 1000-0.9 UT/500ML-% IV SOLN
INTRAVENOUS | Status: DC | PRN
  Administered 2023-09-24: 500 mL

## 2023-09-24 MED ORDER — MIDAZOLAM HCL 2 MG/2ML IJ SOLN
INTRAMUSCULAR | Status: AC
Start: 2023-09-24 — End: ?
  Filled 2023-09-24: qty 4

## 2023-09-24 MED ORDER — MIDAZOLAM HCL 2 MG/2ML IJ SOLN
INTRAMUSCULAR | Status: DC | PRN
  Administered 2023-09-24: 2 mg via INTRAVENOUS

## 2023-09-24 MED ORDER — HEPARIN SODIUM (PORCINE) 1000 UNIT/ML IJ SOLN
INTRAMUSCULAR | Status: AC
Start: 2023-09-24 — End: ?
  Filled 2023-09-24: qty 10

## 2023-09-24 SURGICAL SUPPLY — 7 items
BIOPATCH RED 1 DISK 7.0 (GAUZE/BANDAGES/DRESSINGS) IMPLANT
GLIDEWIRE STIFF .35X180X3 HYDR (WIRE) IMPLANT
GUIDEWIRE SUPER STIFF .035X180 (WIRE) IMPLANT
KIT CATH CHRNC PALINDROME PRCS (CATHETERS) IMPLANT
PACK ANGIOGRAPHY (CUSTOM PROCEDURE TRAY) IMPLANT
SUT MNCRL AB 4-0 PS2 18 (SUTURE) IMPLANT
SUT PROLENE 0 CT 1 30 (SUTURE) IMPLANT

## 2023-09-24 NOTE — Op Note (Signed)
 [facility]   OPERATIVE NOTE     PROCEDURE: 1. Exchange right femoral tunneled dialysis catheter over wire same access   PRE-OPERATIVE DIAGNOSIS: Complication of dialysis device with nonfunction of tunneled catheter; end-stage renal requiring hemodialysis  POST-OPERATIVE DIAGNOSIS: same as above  SURGEON: Renford Dills, M.D.  ANESTHESIA: Conscious sedation was administered under my direct supervision by the interventional radiology RN.  IV Versed plus fentanyl were utilized. Continuous ECG, pulse oximetry and blood pressure was monitored throughout the entire procedure.  Conscious sedation was for a total of 25 minutes and 44 seconds.  ESTIMATED BLOOD LOSS: Minimal  FINDING(S): 1.  Tips of the catheter in the right atrium on fluoroscopy 2.  No obvious pneumothorax on fluoroscopy  SPECIMEN(S):  none  INDICATIONS:   Amy Wu is a 72 y.o. female  presents with end stage renal disease.  Therefore, the patient requires a tunneled dialysis catheter placement.  The patient is informed of  the risks catheter placement include but are not limited to: bleeding, infection, central venous injury, pneumothorax, possible venous stenosis, possible malpositioning in the venous system, and possible infections related to long-term catheter presence.  The patient was aware of these risks and agreed to proceed.  DESCRIPTION: The patient was taken back to Special Procedure suite.  Prior to sedation, the patient was given IV antibiotics.  After obtaining adequate sedation, the patient was prepped and draped in the standard fashion for a chest or neck tunneled dialysis catheter placement.  Appropriate Time Out is called.   The right thigh are then infiltrated with 1% Lidocaine with epinepherine.  A 44 cm tip to cuff catheter is then selected, opened on the back table and prepped.  The cuff of the existing catheter is localized.  Using both blunt and sharp dissection the  cuff is then freed from surrounding attachments. The catheter is then controlled with a hemostat and transected above the level of the cuff.  Under fluoroscopy an Amplatz Super Stiff wire is introduced through the catheter and negotiated into the inferior vena cava. The remaining portion of the catheter is then removed without difficulty.  A new catheter is then threaded over the wire and advanced under fluoroscopy and positioned so that the catheter tip is in the proximal atrium.  Each port was tested by aspirating and flushing.  No resistance was noted.  Each port was then thoroughly flushed with heparinized saline.  The catheter was secured in placed with two interrupted stitches of 0 silk tied to the catheter.   Each port was then packed with concentrated heparin (10,000 Units/mL) at the manufacturer recommended volumes to each port.  Sterile caps were applied to each port.  On completion fluoroscopy, the tips of the catheter were in the right atrium, and there was no evidence of pneumothorax.  COMPLICATIONS: None  CONDITION: [name] 09/24/2023,6:17 PM [place] Office: [phone]   09/24/2023, 6:17 PM

## 2023-09-24 NOTE — H&P (View-Only) (Signed)
 Hospital Consult    Reason for Consult:  Dislodged Tunneled Dialysis Catheter  Requesting Physician:  Dr Chesley Noon MD MRN #:  [id number]  History of Present Illness: This is a 73 y.o. female with past medical history of diabetes, peripheral vascular disease, and ESRD on HD (MWF) who presents to the ED complaining of vascular access problem. Patient reports that she went to her usually scheduled dialysis appointment earlier today and was told that her catheter could not be used because it seemed to have fallen out slightly. Patient states that the catheter was working fine when she received dialysis 3 days ago, has not noticed any pain, swelling, or drainage at the site. She reports having the catheter placed last year in October, has not had any issues since then.   On exam in the emergency room patient is resting comfortably in a stretcher.  She denies having any pain in or around the dialysis catheter.  She denies any drainage exudate or anything leaking from it.  Vascular surgery was consulted to exchange the catheter.   Past Medical History:  Diagnosis Date   Arthritis    Chronic kidney disease    Complication of anesthesia    Diabetes mellitus without complication (HCC)    Hyperlipidemia    Peripheral vascular disease (HCC)     Past Surgical History:  Procedure Laterality Date   A/V SHUNT INTERVENTION Left 11/20/2019   Procedure: A/V SHUNT INTERVENTION (Left Thigh);  Surgeon: Annice Needy, MD;  Location: [facility];  Service: Cardiovascular;  Laterality: Left;   A/V SHUNTOGRAM Left 02/16/2017   Procedure: A/V Shuntogram;  Surgeon: Annice Needy, MD;  Location: [facility];  Service: Cardiovascular;  Laterality: Left;   A/V SHUNTOGRAM Left 12/13/2017   Procedure: A/V SHUNTOGRAM;  Surgeon: Annice Needy, MD;  Location: [facility];  Service: Cardiovascular;  Laterality: Left;   A/V SHUNTOGRAM Left 12/05/2018   Procedure: A/V SHUNTOGRAM;  Surgeon: Annice Needy, MD;  Location: [facility];  Service: Cardiovascular;  Laterality: Left;   A/V SHUNTOGRAM Left 06/03/2020   Procedure: A/V SHUNTOGRAM;  Surgeon: Annice Needy, MD;  Location: [facility];  Service: Cardiovascular;  Laterality: Left;   A/V SHUNTOGRAM Left 05/24/2021   Procedure: A/V SHUNTOGRAM;  Surgeon: Renford Dills, MD;  Location: [facility];  Service: Cardiovascular;  Laterality: Left;  Thigh Graft   ABOVE KNEE LEG AMPUTATION     AV FISTULA PLACEMENT Bilateral    AV FISTULA PLACEMENT Left 07/01/2015   Procedure: INSERTION OF ARTERIOVENOUS (AV) GRAFT THIGH (ARTEGRAFT);  Surgeon: Annice Needy, MD;  Location: [facility];  Service: Vascular;  Laterality: Left;   BELOW KNEE LEG AMPUTATION Left    BREAST BIOPSY     CATARACT EXTRACTION W/PHACO Left 07/10/2022   Procedure: CATARACT EXTRACTION PHACO AND INTRAOCULAR LENS PLACEMENT (IOC) LEFT DIABETIC  12.98  01:13.7;  Surgeon: Nevada Crane, MD;  Location: [facility];  Service: Ophthalmology;  Laterality: Left;   DIALYSIS/PERMA CATHETER INSERTION N/A 05/26/2021   Procedure: DIALYSIS/PERMA CATHETER INSERTION;  Surgeon: Annice Needy, MD;  Location: [facility];  Service: Cardiovascular;  Laterality: N/A;   DIALYSIS/PERMA CATHETER INSERTION N/A 07/05/2022   Procedure: DIALYSIS/PERMA CATHETER INSERTION;  Surgeon: Annice Needy, MD;  Location: [facility];  Service: Cardiovascular;  Laterality: N/A;   DIALYSIS/PERMA CATHETER INSERTION N/A 07/28/2022   Procedure: DIALYSIS/PERMA CATHETER INSERTION;  Surgeon: Annice Needy, MD;  Location: [facility];  Service: Cardiovascular;  Laterality: N/A;   DIALYSIS/PERMA CATHETER INSERTION N/A 09/21/2022   Procedure: DIALYSIS/PERMA CATHETER INSERTION;  Surgeon: Annice Needy, MD;  Location: [facility];  Service: Cardiovascular;  Laterality: N/A;   DIALYSIS/PERMA CATHETER INSERTION N/A 02/15/2023   Procedure: DIALYSIS/PERMA CATHETER INSERTION;   Surgeon: Annice Needy, MD;  Location: [facility];  Service: Cardiovascular;  Laterality: N/A;   DIALYSIS/PERMA CATHETER INSERTION N/A 04/09/2023   Procedure: DIALYSIS/PERMA CATHETER INSERTION;  Surgeon: Renford Dills, MD;  Location: [facility];  Service: Cardiovascular;  Laterality: N/A;   ENDARTERECTOMY FEMORAL Left 07/01/2015   Procedure: SUPERFICIAL FEMORAL ENDARTERECTOMY ;  Surgeon: Annice Needy, MD;  Location: [facility];  Service: Vascular;  Laterality: Left;   EXCHANGE OF A DIALYSIS CATHETER  02/11/2015   Procedure: Exchange Of A Dialysis Catheter;  Surgeon: Annice Needy, MD;  Location: [facility];  Service: Cardiovascular;;   LEG AMPUTATION ABOVE KNEE Right    PERIPHERAL VASCULAR CATHETERIZATION N/A 02/11/2015   Procedure: Dialysis/Perma Catheter Insertion;  Surgeon: Annice Needy, MD;  Location: [facility];  Service: Cardiovascular;  Laterality: N/A;   PERIPHERAL VASCULAR CATHETERIZATION N/A 05/11/2015   Procedure: Dialysis/Perma Catheter Insertion, exchange;  Surgeon: Renford Dills, MD;  Location: [facility];  Service: Cardiovascular;  Laterality: N/A;   PERIPHERAL VASCULAR CATHETERIZATION N/A 05/18/2015   Procedure: Dialysis/Perma Catheter Insertion;  Surgeon: Renford Dills, MD;  Location: [facility];  Service: Cardiovascular;  Laterality: N/A;   PERIPHERAL VASCULAR CATHETERIZATION Left 07/19/2015   Procedure: A/V Shuntogram/Fistulagram;  Surgeon: Annice Needy, MD;  Location: [facility];  Service: Cardiovascular;  Laterality: Left;   PERIPHERAL VASCULAR CATHETERIZATION N/A 07/19/2015   Procedure: A/V Shunt Intervention;  Surgeon: Annice Needy, MD;  Location: [facility];  Service: Cardiovascular;  Laterality: N/A;   PERIPHERAL VASCULAR CATHETERIZATION N/A 08/26/2015   Procedure: Dialysis/Perma Catheter Removal;  Surgeon: Annice Needy, MD;  Location: [facility];  Service: Cardiovascular;  Laterality:  N/A;   PERIPHERAL VASCULAR THROMBECTOMY N/A 12/29/2016   Procedure: Peripheral Vascular Thrombectomy;  Surgeon: Renford Dills, MD;  Location: [facility];  Service: Cardiovascular;  Laterality: N/A;    Allergies  Allergen Reactions   Ancef [Cefazolin] Palpitations   Contrast Media [Iodinated Contrast Media] Palpitations    Prior to Admission medications   Medication Sig Start Date End Date Taking? Authorizing Provider  acetaminophen (TYLENOL) 500 MG tablet Take 1,000 mg by mouth every 8 (eight) hours as needed for moderate pain.    [provider]  aspirin EC 81 MG tablet Take 81 mg by mouth daily. 11/11/20   [provider]  atorvastatin (LIPITOR) 40 MG tablet Take 40 mg by mouth daily.    [provider]  benzonatate (TESSALON) 100 MG capsule Take by mouth 3 (three) times daily as needed for cough. Patient not taking: Reported on 09/21/2022    [provider]  calcitRIOL (ROCALTROL) 0.25 MCG capsule Take 0.25 mcg by mouth daily. Take 3 capsules by mouth three times week at dialysis    [provider]  camphor-menthol Wynelle Fanny) lotion Apply 1 application topically as needed for itching. Twice a day if needed Patient not taking: Reported on 06/30/2022    [provider]  cinacalcet (SENSIPAR) 30 MG tablet Take 30 mg by mouth daily with supper.     [provider]  COMFORT EZ PEN NEEDLES 31G X 6 MM MISC Inject into the skin. 03/17/21   [provider]  Dextromethorphan-guaiFENesin (MUCINEX DM) 30-600 MG TB12 Take 1 tablet by mouth daily. Patient not taking: Reported on 06/30/2022 04/30/17   [provider]  ferrous sulfate 325 (65 FE) MG tablet Take 325 mg by mouth every other day. Patient not taking: Reported on 02/15/2023    [provider]  fluticasone (FLONASE) 50 MCG/ACT nasal spray Place 1 spray into both nostrils daily. Patient not taking: Reported on 06/30/2022    [provider]   gabapentin (NEURONTIN) 100 MG capsule Take 100 mg by mouth 3 (three) times daily. M/W/F after dialysis    [provider]  glucose 4 GM chewable tablet Chew 1 tablet by mouth as needed for low blood sugar. If blood sugar less than 70. Chew 3 tablets and recheck blood sugar in 15 minutes Patient not taking: Reported on 06/30/2022    [provider]  glucose blood test strip 1 each by Other route as needed for other. Use as instructed    [provider]  insulin aspart (NOVOLOG FLEXPEN) 100 UNIT/ML FlexPen Inject into the skin. Patient not taking: Reported on 04/09/2023 07/10/17   [provider]  JANUVIA 25 MG tablet Take 25 mg by mouth daily. 11/02/20   [provider]  ketotifen (ZADITOR) 0.025 % ophthalmic solution Place 1 drop into both eyes 2 (two) times daily. Patient not taking: Reported on 04/09/2023    [provider]  lanthanum (FOSRENOL) 1000 MG chewable tablet Chew 1,000 mg by mouth 2 (two) times daily with a meal.     [provider]  LANTUS 100 UNIT/ML injection Inject 6 Units into the skin as needed (if sugar is over 200). Patient not taking: Reported on 04/09/2023 04/05/21   [provider]  loperamide (IMODIUM A-D) 2 MG tablet Take 2 mg by mouth 4 (four) times daily as needed for diarrhea or loose stools. 2 tablets by mouth for onset of loose stool, than 1 tablet by mouth after each loose stool MAX 4 tablets in 24 hours Patient not taking: Reported on 07/28/2022    [provider]  loratadine (CLARITIN) 10 MG tablet Take 10 mg by mouth daily as needed for allergies. Patient not taking: Reported on 06/30/2022    [provider]  NYSTATIN powder SMARTSIG:Sparingly Topical Daily PRN Patient not taking: Reported on 06/30/2022 11/18/20   [provider]  polyethylene glycol (MIRALAX / GLYCOLAX) packet Take 17 g by mouth daily as needed for moderate constipation or severe constipation. Patient not  taking: Reported on 06/30/2022    [provider]  RA VITAMINS A & D EX Apply topically as needed. Patient not taking: Reported on 06/30/2022    [provider]  ReliOn Lancet Devices 30G MISC Apply topically. Patient not taking: Reported on 06/30/2022 03/17/21   [provider]  rOPINIRole (REQUIP) 0.25 MG tablet Take 0.25 mg by mouth at bedtime as needed.    [provider]  senna-docusate (SENOKOT-S) 8.6-50 MG tablet Take 1-2 tablets by mouth at bedtime as needed for mild constipation or moderate constipation. 1-2 tablets at bedtime if needed Patient not taking: Reported on 07/28/2022    [provider]  Simethicone 180 MG CAPS Take 1 capsule by mouth daily. Patient not taking: Reported on 06/30/2022    [provider]  Skin Protectants, Misc. (EUCERIN) cream Apply 1 application topically 4 (four) times daily as needed for  dry skin.  Patient not taking: Reported on 06/30/2022    [provider]  TRUEplus Lancets 28G MISC Apply topically. 03/17/21   [provider]  Stann Ore INSULIN SYRINGE 31G X 5/16" 0.3 ML MISC SMARTSIG:Injection Daily 03/31/21   [provider]  Vitamins A & D (VITAMIN A & D) ointment Apply topically daily as needed. Patient not taking: Reported on 06/30/2022 08/31/20   [provider]  Zinc Oxide 13 % CREA Apply 1 application topically as needed for irritation. Patient not taking: Reported on 06/30/2022    [provider]    Social History   Socioeconomic History   Marital status: Single    Spouse name: Not on file   Number of children: Not on file   Years of education: Not on file   Highest education level: Not on file  Occupational History   Not on file  Tobacco Use   Smoking status: Never   Smokeless tobacco: Never  Vaping Use   Vaping status: Never Used  Substance and Sexual Activity   Alcohol use: No   Drug use: No   Sexual activity: Not on file  Other Topics  Concern   Not on file  Social History Narrative   Elmer Sow, friend, lives with and have been together 33 years.   Social Drivers of Corporate investment banker Strain: Not on file  Food Insecurity: Not on file  Transportation Needs: Not on file  Physical Activity: Not on file  Stress: Not on file  Social Connections: Not on file  Intimate Partner Violence: Not on file     Family History  Problem Relation Age of Onset   Breast cancer Neg Hx     ROS: Otherwise negative unless mentioned in HPI  Physical Examination  Vitals:   09/24/23 1204  BP: (!) 165/83  Pulse: 67  Resp: 20  Temp: 98.6 F (37 C)  SpO2: 93%   Body mass index is 47.61 kg/m.  General:  WDWN in NAD Gait: Not observed HENT: WNL, normocephalic Pulmonary: normal non-labored breathing, without Rales, rhonchi,  wheezing Cardiac: regular, without  Murmurs, rubs or gallops; without carotid bruits Abdomen: Positive bowel sounds throughout, soft, NT/ND, no masses Skin: without rashes Vascular Exam/Pulses: Unable to palpate pulses, all extremities are warm to touch.  Extremities: without ischemic changes, without Gangrene , without cellulitis; without open wounds;  Musculoskeletal: no muscle wasting or atrophy  Neurologic: A&O X 3;  No focal weakness or paresthesias are detected; speech is fluent/normal Psychiatric:  The pt has Normal affect. Lymph:  Unremarkable  CBC    Component Value Date/Time   WBC 8.4 04/09/2023 1630   RBC 4.87 04/09/2023 1630   HGB 12.4 04/09/2023 1630   HGB 12.4 12/03/2012 1414   HCT 38.9 04/09/2023 1630   HCT 38.0 12/03/2012 1414   PLT 195 04/09/2023 1630   PLT 193 12/03/2012 1414   MCV 79.9 (L) 04/09/2023 1630   MCV 81 12/03/2012 1414   MCH 25.5 (L) 04/09/2023 1630   MCHC 31.9 04/09/2023 1630   RDW 16.1 (H) 04/09/2023 1630   RDW 17.7 (H) 12/03/2012 1414   LYMPHSABS 1.7 04/09/2023 1630   MONOABS 0.7 04/09/2023 1630   EOSABS 0.7 (H) 04/09/2023 1630   BASOSABS  0.1 04/09/2023 1630    BMET    Component Value Date/Time   NA 139 04/09/2023 1630   NA 138 12/03/2012 1414   K 3.3 (L) 07/23/2023 1330   K 4.5 12/03/2012 1414  CL 103 04/09/2023 1630   CL 101 12/03/2012 1414   CO2 24 04/09/2023 1630   CO2 27 12/03/2012 1414   GLUCOSE 109 (H) 04/09/2023 1630   GLUCOSE 326 (H) 12/03/2012 1414   BUN 53 (H) 04/09/2023 1630   BUN 37 (H) 12/03/2012 1414   CREATININE 9.62 (H) 04/09/2023 1630   CREATININE 7.04 (H) 12/03/2012 1414   CALCIUM 9.6 04/09/2023 1630   CALCIUM 8.4 (L) 12/03/2012 1414   GFRNONAA 4 (L) 04/09/2023 1630   GFRNONAA 6 (L) 12/03/2012 1414   GFRAA 9 (L) 11/19/2019 1002   GFRAA 7 (L) 12/03/2012 1414    COAGS: Lab Results  Component Value Date   INR 1.1 05/23/2021   INR 1.0 11/19/2019   INR 1.11 06/22/2015     Non-Invasive Vascular Imaging:   None Ordered  Statin:  Yes.   Beta Blocker:  No. Aspirin:  Yes.   ACEI:  No. ARB:  No. CCB use:  No Other antiplatelets/anticoagulants:  No.    ASSESSMENT/PLAN: This is a 73 y.o. female who presents to [facility] emergency department after going to dialysis today.  At the dialysis center was recognized that the cough on her dialysis catheter to her right groin had been dislodged approximately 1.5 inches from the opening of the access.  No signs or symptoms of infection hematoma or seroma.  Patient reports the line worked well last time she received dialysis.  PLAN Vascular surgery plans on exchanging the dialysis permacatheter over a wire with a new catheter and cuff securely in place.  Patient's labs were taken prior and are all within recommended range to proceed with the procedure.  Vascular surgery plans on doing the procedure at the bedside in the emergency department.  I discussed in detail with the patient today the procedure, benefits, risk, and complications.  Patient verbalized understanding wishes to proceed as soon as possible.  Patient has not eaten since breakfast this  morning.  She remains n.p.o. until we complete the procedure.  Plan is to discharge the patient to home after catheter exchange to continue receiving hemodialysis on an outpatient basis.  Patient again verbalizes her understanding and is in agreement with this plan.   -I discussed the case in detail with Dr. [name] MD and he agrees with the plan.   Marcie [facility] 09/24/2023 2:36 PM

## 2023-09-24 NOTE — ED Notes (Signed)
Vascular Surgeon at bedside. 

## 2023-09-24 NOTE — Interval H&P Note (Signed)
 History and Physical Interval Note:  09/24/2023 5:40 PM  Amy Wu  has presented today for surgery, with the diagnosis of ESRD.  The various methods of treatment have been discussed with the patient and family. After consideration of risks, benefits and other options for treatment, the patient has consented to  Procedure(s): DIALYSIS/PERMA CATHETER REPAIR (Right) as a surgical intervention.  The patient's history has been reviewed, patient examined, no change in status, stable for surgery.  I have reviewed the patient's chart and labs.  Questions were answered to the patient's satisfaction.     [name]

## 2023-09-24 NOTE — ED Triage Notes (Signed)
 Patient states she went to dialysis today but was sent here due to her HD catheter not in the right place.

## 2023-09-24 NOTE — ED Provider Notes (Signed)
 [facility] Provider Note    Event Date/Time   First MD Initiated Contact with Patient 09/24/23 1325     (approximate)   History   Chief Complaint Vascular Access Problem   HPI  Amy Wu is a 73 y.o. female with past medical history of diabetes, peripheral vascular disease, and ESRD on HD (MWF) who presents to the ED complaining of vascular access problem.  Patient reports that she went to her usually scheduled dialysis appointment earlier today and was told that her catheter could not be used because it seemed to have fallen out slightly.  Patient states that the catheter was working fine when she received dialysis 3 days ago, has not noticed any pain, swelling, or drainage at the site.  She reports having the catheter placed last year in October, has not had any issues since then.  She denies any shortness of breath and is otherwise feeling well.     Physical Exam   Triage Vital Signs: ED Triage Vitals  Encounter Vitals Group     BP 09/24/23 1204 (!) 165/83     Systolic BP Percentile --      Diastolic BP Percentile --      Pulse Rate 09/24/23 1204 67     Resp 09/24/23 1204 20     Temp 09/24/23 1204 98.6 F (37 C)     Temp Source 09/24/23 1204 Oral     SpO2 09/24/23 1204 93 %     Weight 09/24/23 1202 220 lb (99.8 kg)     Height --      Head Circumference --      Peak Flow --      Pain Score 09/24/23 1202 0     Pain Loc --      Pain Education --      Exclude from Growth Chart --     Most recent vital signs: Vitals:   09/24/23 1204  BP: (!) 165/83  Pulse: 67  Resp: 20  Temp: 98.6 F (37 C)  SpO2: 93%    Constitutional: Alert and oriented. Eyes: Conjunctivae are normal. Head: Atraumatic. Nose: No congestion/rhinnorhea. Mouth/Throat: Mucous membranes are moist.  Cardiovascular: Normal rate, regular rhythm. Grossly normal heart sounds.  2+ radial pulses bilaterally.  Right groin TDC with cuff exposed approximately 2 cm from the  skin. Respiratory: Normal respiratory effort.  No retractions. Lungs CTAB. Gastrointestinal: Soft and nontender. No distention. Musculoskeletal: No lower extremity tenderness nor edema.  Status post bilateral AKA. Neurologic:  Normal speech and language. No gross focal neurologic deficits are appreciated.    ED Results / Procedures / Treatments   Labs (all labs ordered are listed, but only abnormal results are displayed) Labs Reviewed  CBC WITH DIFFERENTIAL/PLATELET - Abnormal; Notable for the following components:      Result Value   Hemoglobin 11.1 (*)    HCT 34.4 (*)    MCH 25.8 (*)    RDW 18.9 (*)    All other components within normal limits  BASIC METABOLIC PANEL WITH GFR - Abnormal; Notable for the following components:   Potassium 3.0 (*)    Glucose, Bld 122 (*)    BUN 33 (*)    Creatinine, Ser 7.41 (*)    GFR, Estimated 5 (*)    All other components within normal limits     PROCEDURES:  Critical Care performed: No  Procedures   MEDICATIONS ORDERED IN ED: Medications - No data to display   IMPRESSION /  MDM / ASSESSMENT AND PLAN / ED COURSE  I reviewed the triage vital signs and the nursing notes.                              73 y.o. female with past medical history of diabetes, PVD, and ESRD on HD who presents to the ED due to concern for displaced TDC.  Patient's presentation is most consistent with acute presentation with potential threat to life or bodily function.  Differential diagnosis includes, but is not limited to, displaced TDC, catheter site infection, hyperkalemia.  Patient nontoxic-appearing and in no acute distress, vital signs are unremarkable.  TDC site to right groin With No Signs of Infection, but the catheter does appear displaced with exposed cuff.  Case discussed with Dr. Gilda Crease of vascular surgery, will have vascular surgery NP examine the patient but tentatively plan for exchange of the catheter.  Lab results are pending at this time,  patient not in any respiratory distress.  Labs without significant anemia, leukocytosis, or electrolyte abnormality.  Renal function consistent with known ESRD.  Patient turned over to medical provider pending vascular surgery evaluation.      FINAL CLINICAL IMPRESSION(S) / ED DIAGNOSES   Final diagnoses:  Complication associated with dialysis catheter     Rx / DC Orders   ED Discharge Orders     None        Note:  This document was prepared using Dragon voice recognition software and may include unintentional dictation errors.   Chesley Noon, MD 09/24/23 [phone]

## 2023-09-24 NOTE — Consult Note (Signed)
 Hospital Consult    Reason for Consult:  Dislodged Tunneled Dialysis Catheter  Requesting Physician:  Dr Chesley Noon MD MRN #:  [id number]  History of Present Illness: This is a 73 y.o. female with past medical history of diabetes, peripheral vascular disease, and ESRD on HD (MWF) who presents to the ED complaining of vascular access problem. Patient reports that she went to her usually scheduled dialysis appointment earlier today and was told that her catheter could not be used because it seemed to have fallen out slightly. Patient states that the catheter was working fine when she received dialysis 3 days ago, has not noticed any pain, swelling, or drainage at the site. She reports having the catheter placed last year in October, has not had any issues since then.   On exam in the emergency room patient is resting comfortably in a stretcher.  She denies having any pain in or around the dialysis catheter.  She denies any drainage exudate or anything leaking from it.  Vascular surgery was consulted to exchange the catheter.   Past Medical History:  Diagnosis Date   Arthritis    Chronic kidney disease    Complication of anesthesia    Diabetes mellitus without complication (HCC)    Hyperlipidemia    Peripheral vascular disease (HCC)     Past Surgical History:  Procedure Laterality Date   A/V SHUNT INTERVENTION Left 11/20/2019   Procedure: A/V SHUNT INTERVENTION (Left Thigh);  Surgeon: Annice Needy, MD;  Location: [facility];  Service: Cardiovascular;  Laterality: Left;   A/V SHUNTOGRAM Left 02/16/2017   Procedure: A/V Shuntogram;  Surgeon: Annice Needy, MD;  Location: [facility];  Service: Cardiovascular;  Laterality: Left;   A/V SHUNTOGRAM Left 12/13/2017   Procedure: A/V SHUNTOGRAM;  Surgeon: Annice Needy, MD;  Location: [facility];  Service: Cardiovascular;  Laterality: Left;   A/V SHUNTOGRAM Left 12/05/2018   Procedure: A/V SHUNTOGRAM;  Surgeon: Annice Needy, MD;  Location: [facility];  Service: Cardiovascular;  Laterality: Left;   A/V SHUNTOGRAM Left 06/03/2020   Procedure: A/V SHUNTOGRAM;  Surgeon: Annice Needy, MD;  Location: [facility];  Service: Cardiovascular;  Laterality: Left;   A/V SHUNTOGRAM Left 05/24/2021   Procedure: A/V SHUNTOGRAM;  Surgeon: Renford Dills, MD;  Location: [facility];  Service: Cardiovascular;  Laterality: Left;  Thigh Graft   ABOVE KNEE LEG AMPUTATION     AV FISTULA PLACEMENT Bilateral    AV FISTULA PLACEMENT Left 07/01/2015   Procedure: INSERTION OF ARTERIOVENOUS (AV) GRAFT THIGH (ARTEGRAFT);  Surgeon: Annice Needy, MD;  Location: [facility];  Service: Vascular;  Laterality: Left;   BELOW KNEE LEG AMPUTATION Left    BREAST BIOPSY     CATARACT EXTRACTION W/PHACO Left 07/10/2022   Procedure: CATARACT EXTRACTION PHACO AND INTRAOCULAR LENS PLACEMENT (IOC) LEFT DIABETIC  12.98  01:13.7;  Surgeon: Nevada Crane, MD;  Location: [facility];  Service: Ophthalmology;  Laterality: Left;   DIALYSIS/PERMA CATHETER INSERTION N/A 05/26/2021   Procedure: DIALYSIS/PERMA CATHETER INSERTION;  Surgeon: Annice Needy, MD;  Location: [facility];  Service: Cardiovascular;  Laterality: N/A;   DIALYSIS/PERMA CATHETER INSERTION N/A 07/05/2022   Procedure: DIALYSIS/PERMA CATHETER INSERTION;  Surgeon: Annice Needy, MD;  Location: [facility];  Service: Cardiovascular;  Laterality: N/A;   DIALYSIS/PERMA CATHETER INSERTION N/A 07/28/2022   Procedure: DIALYSIS/PERMA CATHETER INSERTION;  Surgeon: Annice Needy, MD;  Location: [facility];  Service: Cardiovascular;  Laterality: N/A;   DIALYSIS/PERMA CATHETER INSERTION N/A 09/21/2022   Procedure: DIALYSIS/PERMA CATHETER INSERTION;  Surgeon: Annice Needy, MD;  Location: [facility];  Service: Cardiovascular;  Laterality: N/A;   DIALYSIS/PERMA CATHETER INSERTION N/A 02/15/2023   Procedure: DIALYSIS/PERMA CATHETER INSERTION;   Surgeon: Annice Needy, MD;  Location: [facility];  Service: Cardiovascular;  Laterality: N/A;   DIALYSIS/PERMA CATHETER INSERTION N/A 04/09/2023   Procedure: DIALYSIS/PERMA CATHETER INSERTION;  Surgeon: Renford Dills, MD;  Location: [facility];  Service: Cardiovascular;  Laterality: N/A;   ENDARTERECTOMY FEMORAL Left 07/01/2015   Procedure: SUPERFICIAL FEMORAL ENDARTERECTOMY ;  Surgeon: Annice Needy, MD;  Location: [facility];  Service: Vascular;  Laterality: Left;   EXCHANGE OF A DIALYSIS CATHETER  02/11/2015   Procedure: Exchange Of A Dialysis Catheter;  Surgeon: Annice Needy, MD;  Location: [facility];  Service: Cardiovascular;;   LEG AMPUTATION ABOVE KNEE Right    PERIPHERAL VASCULAR CATHETERIZATION N/A 02/11/2015   Procedure: Dialysis/Perma Catheter Insertion;  Surgeon: Annice Needy, MD;  Location: [facility];  Service: Cardiovascular;  Laterality: N/A;   PERIPHERAL VASCULAR CATHETERIZATION N/A 05/11/2015   Procedure: Dialysis/Perma Catheter Insertion, exchange;  Surgeon: Renford Dills, MD;  Location: [facility];  Service: Cardiovascular;  Laterality: N/A;   PERIPHERAL VASCULAR CATHETERIZATION N/A 05/18/2015   Procedure: Dialysis/Perma Catheter Insertion;  Surgeon: Renford Dills, MD;  Location: [facility];  Service: Cardiovascular;  Laterality: N/A;   PERIPHERAL VASCULAR CATHETERIZATION Left 07/19/2015   Procedure: A/V Shuntogram/Fistulagram;  Surgeon: Annice Needy, MD;  Location: [facility];  Service: Cardiovascular;  Laterality: Left;   PERIPHERAL VASCULAR CATHETERIZATION N/A 07/19/2015   Procedure: A/V Shunt Intervention;  Surgeon: Annice Needy, MD;  Location: [facility];  Service: Cardiovascular;  Laterality: N/A;   PERIPHERAL VASCULAR CATHETERIZATION N/A 08/26/2015   Procedure: Dialysis/Perma Catheter Removal;  Surgeon: Annice Needy, MD;  Location: [facility];  Service: Cardiovascular;  Laterality:  N/A;   PERIPHERAL VASCULAR THROMBECTOMY N/A 12/29/2016   Procedure: Peripheral Vascular Thrombectomy;  Surgeon: Renford Dills, MD;  Location: [facility];  Service: Cardiovascular;  Laterality: N/A;    Allergies  Allergen Reactions   Ancef [Cefazolin] Palpitations   Contrast Media [Iodinated Contrast Media] Palpitations    Prior to Admission medications   Medication Sig Start Date End Date Taking? Authorizing Provider  acetaminophen (TYLENOL) 500 MG tablet Take 1,000 mg by mouth every 8 (eight) hours as needed for moderate pain.    [provider]  aspirin EC 81 MG tablet Take 81 mg by mouth daily. 11/11/20   [provider]  atorvastatin (LIPITOR) 40 MG tablet Take 40 mg by mouth daily.    [provider]  benzonatate (TESSALON) 100 MG capsule Take by mouth 3 (three) times daily as needed for cough. Patient not taking: Reported on 09/21/2022    [provider]  calcitRIOL (ROCALTROL) 0.25 MCG capsule Take 0.25 mcg by mouth daily. Take 3 capsules by mouth three times week at dialysis    [provider]  camphor-menthol Wynelle Fanny) lotion Apply 1 application topically as needed for itching. Twice a day if needed Patient not taking: Reported on 06/30/2022    [provider]  cinacalcet (SENSIPAR) 30 MG tablet Take 30 mg by mouth daily with supper.     [provider]  COMFORT EZ PEN NEEDLES 31G X 6 MM MISC Inject into the skin. 03/17/21   [provider]  Dextromethorphan-guaiFENesin (MUCINEX DM) 30-600 MG TB12 Take 1 tablet by mouth daily. Patient not taking: Reported on 06/30/2022 04/30/17   [provider]  ferrous sulfate 325 (65 FE) MG tablet Take 325 mg by mouth every other day. Patient not taking: Reported on 02/15/2023    [provider]  fluticasone (FLONASE) 50 MCG/ACT nasal spray Place 1 spray into both nostrils daily. Patient not taking: Reported on 06/30/2022    [provider]   gabapentin (NEURONTIN) 100 MG capsule Take 100 mg by mouth 3 (three) times daily. M/W/F after dialysis    [provider]  glucose 4 GM chewable tablet Chew 1 tablet by mouth as needed for low blood sugar. If blood sugar less than 70. Chew 3 tablets and recheck blood sugar in 15 minutes Patient not taking: Reported on 06/30/2022    [provider]  glucose blood test strip 1 each by Other route as needed for other. Use as instructed    [provider]  insulin aspart (NOVOLOG FLEXPEN) 100 UNIT/ML FlexPen Inject into the skin. Patient not taking: Reported on 04/09/2023 07/10/17   [provider]  JANUVIA 25 MG tablet Take 25 mg by mouth daily. 11/02/20   [provider]  ketotifen (ZADITOR) 0.025 % ophthalmic solution Place 1 drop into both eyes 2 (two) times daily. Patient not taking: Reported on 04/09/2023    [provider]  lanthanum (FOSRENOL) 1000 MG chewable tablet Chew 1,000 mg by mouth 2 (two) times daily with a meal.     [provider]  LANTUS 100 UNIT/ML injection Inject 6 Units into the skin as needed (if sugar is over 200). Patient not taking: Reported on 04/09/2023 04/05/21   [provider]  loperamide (IMODIUM A-D) 2 MG tablet Take 2 mg by mouth 4 (four) times daily as needed for diarrhea or loose stools. 2 tablets by mouth for onset of loose stool, than 1 tablet by mouth after each loose stool MAX 4 tablets in 24 hours Patient not taking: Reported on 07/28/2022    [provider]  loratadine (CLARITIN) 10 MG tablet Take 10 mg by mouth daily as needed for allergies. Patient not taking: Reported on 06/30/2022    [provider]  NYSTATIN powder SMARTSIG:Sparingly Topical Daily PRN Patient not taking: Reported on 06/30/2022 11/18/20   [provider]  polyethylene glycol (MIRALAX / GLYCOLAX) packet Take 17 g by mouth daily as needed for moderate constipation or severe constipation. Patient not  taking: Reported on 06/30/2022    [provider]  RA VITAMINS A & D EX Apply topically as needed. Patient not taking: Reported on 06/30/2022    [provider]  ReliOn Lancet Devices 30G MISC Apply topically. Patient not taking: Reported on 06/30/2022 03/17/21   [provider]  rOPINIRole (REQUIP) 0.25 MG tablet Take 0.25 mg by mouth at bedtime as needed.    [provider]  senna-docusate (SENOKOT-S) 8.6-50 MG tablet Take 1-2 tablets by mouth at bedtime as needed for mild constipation or moderate constipation. 1-2 tablets at bedtime if needed Patient not taking: Reported on 07/28/2022    [provider]  Simethicone 180 MG CAPS Take 1 capsule by mouth daily. Patient not taking: Reported on 06/30/2022    [provider]  Skin Protectants, Misc. (EUCERIN) cream Apply 1 application topically 4 (four) times daily as needed for  dry skin.  Patient not taking: Reported on 06/30/2022    [provider]  TRUEplus Lancets 28G MISC Apply topically. 03/17/21   [provider]  Stann Ore INSULIN SYRINGE 31G X 5/16" 0.3 ML MISC SMARTSIG:Injection Daily 03/31/21   [provider]  Vitamins A & D (VITAMIN A & D) ointment Apply topically daily as needed. Patient not taking: Reported on 06/30/2022 08/31/20   [provider]  Zinc Oxide 13 % CREA Apply 1 application topically as needed for irritation. Patient not taking: Reported on 06/30/2022    [provider]    Social History   Socioeconomic History   Marital status: Single    Spouse name: Not on file   Number of children: Not on file   Years of education: Not on file   Highest education level: Not on file  Occupational History   Not on file  Tobacco Use   Smoking status: Never   Smokeless tobacco: Never  Vaping Use   Vaping status: Never Used  Substance and Sexual Activity   Alcohol use: No   Drug use: No   Sexual activity: Not on file  Other Topics  Concern   Not on file  Social History Narrative   Elmer Sow, friend, lives with and have been together 33 years.   Social Drivers of Corporate investment banker Strain: Not on file  Food Insecurity: Not on file  Transportation Needs: Not on file  Physical Activity: Not on file  Stress: Not on file  Social Connections: Not on file  Intimate Partner Violence: Not on file     Family History  Problem Relation Age of Onset   Breast cancer Neg Hx     ROS: Otherwise negative unless mentioned in HPI  Physical Examination  Vitals:   09/24/23 1204  BP: (!) 165/83  Pulse: 67  Resp: 20  Temp: 98.6 F (37 C)  SpO2: 93%   Body mass index is 47.61 kg/m.  General:  WDWN in NAD Gait: Not observed HENT: WNL, normocephalic Pulmonary: normal non-labored breathing, without Rales, rhonchi,  wheezing Cardiac: regular, without  Murmurs, rubs or gallops; without carotid bruits Abdomen: Positive bowel sounds throughout, soft, NT/ND, no masses Skin: without rashes Vascular Exam/Pulses: Unable to palpate pulses, all extremities are warm to touch.  Extremities: without ischemic changes, without Gangrene , without cellulitis; without open wounds;  Musculoskeletal: no muscle wasting or atrophy  Neurologic: A&O X 3;  No focal weakness or paresthesias are detected; speech is fluent/normal Psychiatric:  The pt has Normal affect. Lymph:  Unremarkable  CBC    Component Value Date/Time   WBC 8.4 04/09/2023 1630   RBC 4.87 04/09/2023 1630   HGB 12.4 04/09/2023 1630   HGB 12.4 12/03/2012 1414   HCT 38.9 04/09/2023 1630   HCT 38.0 12/03/2012 1414   PLT 195 04/09/2023 1630   PLT 193 12/03/2012 1414   MCV 79.9 (L) 04/09/2023 1630   MCV 81 12/03/2012 1414   MCH 25.5 (L) 04/09/2023 1630   MCHC 31.9 04/09/2023 1630   RDW 16.1 (H) 04/09/2023 1630   RDW 17.7 (H) 12/03/2012 1414   LYMPHSABS 1.7 04/09/2023 1630   MONOABS 0.7 04/09/2023 1630   EOSABS 0.7 (H) 04/09/2023 1630   BASOSABS  0.1 04/09/2023 1630    BMET    Component Value Date/Time   NA 139 04/09/2023 1630   NA 138 12/03/2012 1414   K 3.3 (L) 07/23/2023 1330   K 4.5 12/03/2012 1414  CL 103 04/09/2023 1630   CL 101 12/03/2012 1414   CO2 24 04/09/2023 1630   CO2 27 12/03/2012 1414   GLUCOSE 109 (H) 04/09/2023 1630   GLUCOSE 326 (H) 12/03/2012 1414   BUN 53 (H) 04/09/2023 1630   BUN 37 (H) 12/03/2012 1414   CREATININE 9.62 (H) 04/09/2023 1630   CREATININE 7.04 (H) 12/03/2012 1414   CALCIUM 9.6 04/09/2023 1630   CALCIUM 8.4 (L) 12/03/2012 1414   GFRNONAA 4 (L) 04/09/2023 1630   GFRNONAA 6 (L) 12/03/2012 1414   GFRAA 9 (L) 11/19/2019 1002   GFRAA 7 (L) 12/03/2012 1414    COAGS: Lab Results  Component Value Date   INR 1.1 05/23/2021   INR 1.0 11/19/2019   INR 1.11 06/22/2015     Non-Invasive Vascular Imaging:   None Ordered  Statin:  Yes.   Beta Blocker:  No. Aspirin:  Yes.   ACEI:  No. ARB:  No. CCB use:  No Other antiplatelets/anticoagulants:  No.    ASSESSMENT/PLAN: This is a 73 y.o. female who presents to [facility] emergency department after going to dialysis today.  At the dialysis center was recognized that the cough on her dialysis catheter to her right groin had been dislodged approximately 1.5 inches from the opening of the access.  No signs or symptoms of infection hematoma or seroma.  Patient reports the line worked well last time she received dialysis.  PLAN Vascular surgery plans on exchanging the dialysis permacatheter over a wire with a new catheter and cuff securely in place.  Patient's labs were taken prior and are all within recommended range to proceed with the procedure.  Vascular surgery plans on doing the procedure at the bedside in the emergency department.  I discussed in detail with the patient today the procedure, benefits, risk, and complications.  Patient verbalized understanding wishes to proceed as soon as possible.  Patient has not eaten since breakfast this  morning.  She remains n.p.o. until we complete the procedure.  Plan is to discharge the patient to home after catheter exchange to continue receiving hemodialysis on an outpatient basis.  Patient again verbalizes her understanding and is in agreement with this plan.   -I discussed the case in detail with Dr. [name] MD and he agrees with the plan.   Marcie Bal [facility] 09/24/2023 2:36 PM

## 2023-09-25 ENCOUNTER — Encounter: Payer: Self-pay | Admitting: Vascular Surgery

## 2023-09-25 LAB — GLUCOSE, CAPILLARY: Glucose-Capillary: 108 mg/dL — ABNORMAL HIGH (ref 70–99)

## 2023-10-01 ENCOUNTER — Emergency Department: Payer: Medicare (Managed Care)

## 2023-10-01 ENCOUNTER — Inpatient Hospital Stay
Admission: EM | Admit: 2023-10-01 | Discharge: 2023-10-12 | DRG: 280 | Disposition: A | Discharge status: Home Health | Payer: Medicare (Managed Care) | Attending: Student in an Organized Health Care Education/Training Program | Admitting: Student in an Organized Health Care Education/Training Program

## 2023-10-01 ENCOUNTER — Other Ambulatory Visit: Payer: Self-pay

## 2023-10-01 DIAGNOSIS — I21A1 Myocardial infarction type 2: Secondary | ICD-10-CM | POA: Diagnosis not present

## 2023-10-01 DIAGNOSIS — A419 Sepsis, unspecified organism: Secondary | ICD-10-CM

## 2023-10-01 DIAGNOSIS — R7881 Bacteremia: Secondary | ICD-10-CM

## 2023-10-01 DIAGNOSIS — Z91158 Patient's noncompliance with renal dialysis for other reason: Secondary | ICD-10-CM

## 2023-10-01 DIAGNOSIS — E8770 Fluid overload, unspecified: Secondary | ICD-10-CM | POA: Diagnosis present

## 2023-10-01 DIAGNOSIS — Z66 Do not resuscitate: Secondary | ICD-10-CM | POA: Diagnosis present

## 2023-10-01 DIAGNOSIS — T80211A Bloodstream infection due to central venous catheter, initial encounter: Secondary | ICD-10-CM | POA: Diagnosis not present

## 2023-10-01 DIAGNOSIS — I483 Typical atrial flutter: Secondary | ICD-10-CM

## 2023-10-01 DIAGNOSIS — G928 Other toxic encephalopathy: Secondary | ICD-10-CM | POA: Diagnosis not present

## 2023-10-01 DIAGNOSIS — N186 End stage renal disease: Secondary | ICD-10-CM

## 2023-10-01 DIAGNOSIS — Z881 Allergy status to other antibiotic agents status: Secondary | ICD-10-CM

## 2023-10-01 DIAGNOSIS — Z6841 Body Mass Index (BMI) 40.0 and over, adult: Secondary | ICD-10-CM

## 2023-10-01 DIAGNOSIS — Z1152 Encounter for screening for COVID-19: Secondary | ICD-10-CM

## 2023-10-01 DIAGNOSIS — G9341 Metabolic encephalopathy: Secondary | ICD-10-CM | POA: Diagnosis present

## 2023-10-01 DIAGNOSIS — R6521 Severe sepsis with septic shock: Secondary | ICD-10-CM | POA: Diagnosis present

## 2023-10-01 DIAGNOSIS — A488 Other specified bacterial diseases: Secondary | ICD-10-CM

## 2023-10-01 DIAGNOSIS — E785 Hyperlipidemia, unspecified: Secondary | ICD-10-CM

## 2023-10-01 DIAGNOSIS — J9601 Acute respiratory failure with hypoxia: Secondary | ICD-10-CM | POA: Diagnosis not present

## 2023-10-01 DIAGNOSIS — E1165 Type 2 diabetes mellitus with hyperglycemia: Secondary | ICD-10-CM | POA: Diagnosis not present

## 2023-10-01 DIAGNOSIS — E1151 Type 2 diabetes mellitus with diabetic peripheral angiopathy without gangrene: Secondary | ICD-10-CM | POA: Diagnosis present

## 2023-10-01 DIAGNOSIS — N2581 Secondary hyperparathyroidism of renal origin: Secondary | ICD-10-CM | POA: Diagnosis present

## 2023-10-01 DIAGNOSIS — G4730 Sleep apnea, unspecified: Secondary | ICD-10-CM | POA: Diagnosis present

## 2023-10-01 DIAGNOSIS — Y848 Other medical procedures as the cause of abnormal reaction of the patient, or of later complication, without mention of misadventure at the time of the procedure: Secondary | ICD-10-CM | POA: Diagnosis present

## 2023-10-01 DIAGNOSIS — E1169 Type 2 diabetes mellitus with other specified complication: Secondary | ICD-10-CM

## 2023-10-01 DIAGNOSIS — E1122 Type 2 diabetes mellitus with diabetic chronic kidney disease: Secondary | ICD-10-CM | POA: Diagnosis present

## 2023-10-01 DIAGNOSIS — R7989 Other specified abnormal findings of blood chemistry: Secondary | ICD-10-CM

## 2023-10-01 DIAGNOSIS — T827XXA Infection and inflammatory reaction due to other cardiac and vascular devices, implants and grafts, initial encounter: Secondary | ICD-10-CM

## 2023-10-01 DIAGNOSIS — E871 Hypo-osmolality and hyponatremia: Secondary | ICD-10-CM | POA: Diagnosis present

## 2023-10-01 DIAGNOSIS — Z89512 Acquired absence of left leg below knee: Secondary | ICD-10-CM

## 2023-10-01 DIAGNOSIS — G929 Unspecified toxic encephalopathy: Secondary | ICD-10-CM | POA: Diagnosis present

## 2023-10-01 DIAGNOSIS — F0150 Vascular dementia without behavioral disturbance: Secondary | ICD-10-CM | POA: Diagnosis present

## 2023-10-01 DIAGNOSIS — J189 Pneumonia, unspecified organism: Secondary | ICD-10-CM

## 2023-10-01 DIAGNOSIS — Z89611 Acquired absence of right leg above knee: Secondary | ICD-10-CM

## 2023-10-01 DIAGNOSIS — J181 Lobar pneumonia, unspecified organism: Secondary | ICD-10-CM | POA: Diagnosis present

## 2023-10-01 DIAGNOSIS — G9340 Encephalopathy, unspecified: Secondary | ICD-10-CM

## 2023-10-01 DIAGNOSIS — Z880 Allergy status to penicillin: Secondary | ICD-10-CM

## 2023-10-01 DIAGNOSIS — I214 Non-ST elevation (NSTEMI) myocardial infarction: Secondary | ICD-10-CM | POA: Diagnosis present

## 2023-10-01 DIAGNOSIS — R1310 Dysphagia, unspecified: Secondary | ICD-10-CM | POA: Diagnosis not present

## 2023-10-01 DIAGNOSIS — I252 Old myocardial infarction: Secondary | ICD-10-CM

## 2023-10-01 DIAGNOSIS — R001 Bradycardia, unspecified: Secondary | ICD-10-CM | POA: Diagnosis not present

## 2023-10-01 DIAGNOSIS — Z7984 Long term (current) use of oral hypoglycemic drugs: Secondary | ICD-10-CM

## 2023-10-01 DIAGNOSIS — Z79899 Other long term (current) drug therapy: Secondary | ICD-10-CM

## 2023-10-01 DIAGNOSIS — E119 Type 2 diabetes mellitus without complications: Secondary | ICD-10-CM

## 2023-10-01 DIAGNOSIS — T361X5A Adverse effect of cephalosporins and other beta-lactam antibiotics, initial encounter: Secondary | ICD-10-CM | POA: Diagnosis not present

## 2023-10-01 DIAGNOSIS — I120 Hypertensive chronic kidney disease with stage 5 chronic kidney disease or end stage renal disease: Secondary | ICD-10-CM | POA: Diagnosis present

## 2023-10-01 DIAGNOSIS — I2510 Atherosclerotic heart disease of native coronary artery without angina pectoris: Secondary | ICD-10-CM | POA: Diagnosis present

## 2023-10-01 DIAGNOSIS — A4153 Sepsis due to Serratia: Secondary | ICD-10-CM | POA: Diagnosis present

## 2023-10-01 DIAGNOSIS — I70208 Unspecified atherosclerosis of native arteries of extremities, other extremity: Secondary | ICD-10-CM | POA: Diagnosis present

## 2023-10-01 DIAGNOSIS — E66813 Obesity, class 3: Secondary | ICD-10-CM | POA: Diagnosis present

## 2023-10-01 DIAGNOSIS — Z7982 Long term (current) use of aspirin: Secondary | ICD-10-CM

## 2023-10-01 DIAGNOSIS — D696 Thrombocytopenia, unspecified: Secondary | ICD-10-CM | POA: Diagnosis present

## 2023-10-01 DIAGNOSIS — Z992 Dependence on renal dialysis: Secondary | ICD-10-CM

## 2023-10-01 DIAGNOSIS — J9811 Atelectasis: Secondary | ICD-10-CM | POA: Diagnosis present

## 2023-10-01 DIAGNOSIS — D631 Anemia in chronic kidney disease: Secondary | ICD-10-CM | POA: Diagnosis present

## 2023-10-01 DIAGNOSIS — Z91041 Radiographic dye allergy status: Secondary | ICD-10-CM

## 2023-10-01 LAB — HEPATITIS B SURFACE ANTIGEN: Hepatitis B Surface Ag: NONREACTIVE

## 2023-10-01 LAB — LACTIC ACID, PLASMA
Lactic Acid, Venous: 1.9 mmol/L (ref 0.5–1.9)
Lactic Acid, Venous: 1.5 mmol/L (ref 0.5–1.9)

## 2023-10-01 LAB — CBC WITH DIFFERENTIAL/PLATELET
Abs Immature Granulocytes: 0.11 10*3/uL — ABNORMAL HIGH (ref 0.00–0.07)
Basophils Absolute: 0 10*3/uL (ref 0.0–0.1)
Basophils Relative: 0 %
Eosinophils Absolute: 0 10*3/uL (ref 0.0–0.5)
Eosinophils Relative: 0 %
HCT: 35.9 % — ABNORMAL LOW (ref 36.0–46.0)
Hemoglobin: 11.6 g/dL — ABNORMAL LOW (ref 12.0–15.0)
Immature Granulocytes: 1 %
Lymphocytes Relative: 5 %
Lymphs Abs: 0.7 10*3/uL (ref 0.7–4.0)
MCH: 26.2 pg (ref 26.0–34.0)
MCHC: 32.3 g/dL (ref 30.0–36.0)
MCV: 81.2 fL (ref 80.0–100.0)
Monocytes Absolute: 0.8 10*3/uL (ref 0.1–1.0)
Monocytes Relative: 7 %
Neutro Abs: 10.7 10*3/uL — ABNORMAL HIGH (ref 1.7–7.7)
Neutrophils Relative %: 87 %
Platelets: 105 10*3/uL — ABNORMAL LOW (ref 150–400)
RBC: 4.42 MIL/uL (ref 3.87–5.11)
RDW: 17.8 % — ABNORMAL HIGH (ref 11.5–15.5)
WBC: 12.3 10*3/uL — ABNORMAL HIGH (ref 4.0–10.5)
nRBC: 0 % (ref 0.0–0.2)

## 2023-10-01 LAB — COMPREHENSIVE METABOLIC PANEL WITH GFR
ALT: 7 U/L (ref 0–44)
AST: 25 U/L (ref 15–41)
Albumin: 3.1 g/dL — ABNORMAL LOW (ref 3.5–5.0)
Alkaline Phosphatase: 70 U/L (ref 38–126)
Anion gap: 14 (ref 5–15)
BUN: 31 mg/dL — ABNORMAL HIGH (ref 8–23)
CO2: 23 mmol/L (ref 22–32)
Calcium: 8.6 mg/dL — ABNORMAL LOW (ref 8.9–10.3)
Chloride: 101 mmol/L (ref 98–111)
Creatinine, Ser: 7.42 mg/dL — ABNORMAL HIGH (ref 0.44–1.00)
GFR, Estimated: 5 mL/min — ABNORMAL LOW (ref >60)
Glucose, Bld: 180 mg/dL — ABNORMAL HIGH (ref 70–99)
Potassium: 3 mmol/L — ABNORMAL LOW (ref 3.5–5.1)
Sodium: 138 mmol/L (ref 135–145)
Total Bilirubin: 0.9 mg/dL (ref 0.0–1.2)
Total Protein: 6.7 g/dL (ref 6.5–8.1)

## 2023-10-01 LAB — RESP PANEL BY RT-PCR (RSV, FLU A&B, COVID)  RVPGX2
Influenza A by PCR: NEGATIVE
Influenza B by PCR: NEGATIVE
Resp Syncytial Virus by PCR: NEGATIVE
SARS Coronavirus 2 by RT PCR: NEGATIVE

## 2023-10-01 LAB — AMMONIA: Ammonia: 12 umol/L (ref 9–35)

## 2023-10-01 LAB — PROTIME-INR
INR: 1.4 — ABNORMAL HIGH (ref 0.8–1.2)
Prothrombin Time: 17.4 s — ABNORMAL HIGH (ref 11.4–15.2)

## 2023-10-01 LAB — CBG MONITORING, ED: Glucose-Capillary: 150 mg/dL — ABNORMAL HIGH (ref 70–99)

## 2023-10-01 LAB — GLUCOSE, CAPILLARY: Glucose-Capillary: 167 mg/dL — ABNORMAL HIGH (ref 70–99)

## 2023-10-01 LAB — BRAIN NATRIURETIC PEPTIDE: B Natriuretic Peptide: 1436.1 pg/mL — ABNORMAL HIGH (ref 0.0–100.0)

## 2023-10-01 MED ORDER — ENOXAPARIN SODIUM 40 MG/0.4ML IJ SOSY: 40.0000 mg | PREFILLED_SYRINGE | INTRAMUSCULAR | Status: DC

## 2023-10-01 MED ORDER — ONDANSETRON HCL 4 MG PO TABS: 4.0000 mg | ORAL_TABLET | Freq: Four times a day (QID) | ORAL | Status: DC | PRN

## 2023-10-01 MED ORDER — LEVOFLOXACIN IN D5W 500 MG/100ML IV SOLN
500.0000 mg | INTRAVENOUS | Status: DC
Start: 2023-10-03 — End: 2023-10-01

## 2023-10-01 MED ORDER — ONDANSETRON HCL 4 MG/2ML IJ SOLN
4.0000 mg | Freq: Four times a day (QID) | INTRAMUSCULAR | Status: DC | PRN
Start: 2023-10-01 — End: 2023-10-13

## 2023-10-01 MED ORDER — VANCOMYCIN HCL IN DEXTROSE 1-5 GM/200ML-% IV SOLN
1000.0000 mg | Freq: Once | INTRAVENOUS | Status: AC
  Administered 2023-10-01: 1000 mg via INTRAVENOUS
  Filled 2023-10-01: qty 200

## 2023-10-01 MED ORDER — LEVOFLOXACIN IN D5W 750 MG/150ML IV SOLN
750.0000 mg | Freq: Once | INTRAVENOUS | Status: AC
  Administered 2023-10-01: 750 mg via INTRAVENOUS
  Filled 2023-10-01: qty 150

## 2023-10-01 MED ORDER — SODIUM CHLORIDE 0.9 % IV SOLN
2.0000 g | INTRAVENOUS | Status: DC
  Filled 2023-10-01: qty 20

## 2023-10-01 MED ORDER — HEPARIN SODIUM (PORCINE) 5000 UNIT/ML IJ SOLN
5000.0000 [IU] | Freq: Three times a day (TID) | INTRAMUSCULAR | Status: DC
  Administered 2023-10-01 – 2023-10-03 (×5): 5000 [IU] via SUBCUTANEOUS
  Filled 2023-10-01 (×6): qty 1

## 2023-10-01 MED ORDER — TIZANIDINE HCL 2 MG PO TABS
2.0000 mg | ORAL_TABLET | Freq: Two times a day (BID) | ORAL | Status: DC
  Administered 2023-10-01 – 2023-10-03 (×5): 2 mg via ORAL
  Filled 2023-10-01 (×6): qty 1

## 2023-10-01 MED ORDER — INSULIN ASPART 100 UNIT/ML IJ SOLN
0.0000 [IU] | Freq: Three times a day (TID) | INTRAMUSCULAR | Status: AC
Start: 2023-10-01 — End: ?
  Administered 2023-10-02 (×3): 1 [IU] via SUBCUTANEOUS
  Administered 2023-10-03 – 2023-10-04 (×4): 2 [IU] via SUBCUTANEOUS
  Administered 2023-10-05: 5 [IU] via SUBCUTANEOUS
  Administered 2023-10-05: 9 [IU] via SUBCUTANEOUS
  Administered 2023-10-05: 5 [IU] via SUBCUTANEOUS
  Administered 2023-10-06: 9 [IU] via SUBCUTANEOUS
  Filled 2023-10-01 (×8): qty 1

## 2023-10-01 MED ORDER — ACETAMINOPHEN 325 MG PO TABS
ORAL_TABLET | ORAL | Status: AC
  Filled 2023-10-01: qty 2

## 2023-10-01 MED ORDER — ACETAMINOPHEN 325 MG PO TABS
650.0000 mg | ORAL_TABLET | Freq: Four times a day (QID) | ORAL | Status: DC | PRN
  Administered 2023-10-01 (×2): 650 mg via ORAL
  Filled 2023-10-01 (×3): qty 2

## 2023-10-01 MED ORDER — HEPARIN SODIUM (PORCINE) 1000 UNIT/ML IJ SOLN
INTRAMUSCULAR | Status: AC
  Filled 2023-10-01: qty 10

## 2023-10-01 MED ORDER — SODIUM CHLORIDE 0.9 % IV SOLN
500.0000 mg | Freq: Every day | INTRAVENOUS | Status: DC
  Administered 2023-10-02: 500 mg via INTRAVENOUS
  Filled 2023-10-01: qty 5

## 2023-10-01 MED ORDER — CHLORHEXIDINE GLUCONATE CLOTH 2 % EX PADS
6.0000 | MEDICATED_PAD | Freq: Every day | CUTANEOUS | Status: DC
  Administered 2023-10-02 – 2023-10-12 (×11): 6 via TOPICAL
  Filled 2023-10-01: qty 6

## 2023-10-01 MED ORDER — MIDODRINE HCL 5 MG PO TABS
5.0000 mg | ORAL_TABLET | Freq: Once | ORAL | Status: AC
  Administered 2023-10-01: 5 mg via ORAL
  Filled 2023-10-01: qty 1

## 2023-10-01 NOTE — ED Notes (Signed)
 2 sets of blood cultures obtained. #1 set obtained from L arm and #2 obtained from R wrist.  Lactic, red, lavender, green, blue top obtained as well and sent to lab.

## 2023-10-01 NOTE — ED Triage Notes (Signed)
 Pt arrives via EMS from home due to AMS. EMS sts that pt was febrile at home and they gave pt tylenol with a NS bolus. Pt o2 sat was found to be in the high 70's low 80's on RA. EMS applied 6l/min via [place] and pt o2 sat at this time is WNL.

## 2023-10-01 NOTE — Assessment & Plan Note (Addendum)
 Baseline ESRD on hemodialysis Monday Wednesday Friday Pending hemodialysis today in setting of missed session  Monitor

## 2023-10-01 NOTE — Sepsis Progress Note (Signed)
 Code Sepsis protocol being monitored by eLink.

## 2023-10-01 NOTE — ED Notes (Signed)
 CCMD notified of pt on cardiac monitor

## 2023-10-01 NOTE — ED Provider Notes (Addendum)
 [facility] Provider Note    Event Date/Time   First MD Initiated Contact with Patient 10/01/23 [phone]     (approximate)   History   Altered Mental Status   HPI  Amy Wu is a 73 y.o. female with ESRD with right femoral vascular catheter noted who comes in with increasing somnolence.  Patient denies any falls she is alert and oriented x 2.  She states that she does not have is not been feeling well.  40 minutes got there her temperature was 103 and patient was given Tylenol with EMS as well as fluid bolus.  They noted patient to be hypoxic and patient was given 500 cc of fluid.  Patient placed on 6 L nasal cannula.  Patient reports that she is due for dialysis today.  She denies any urinary symptoms, back pain, chest pain or other concerns.  Physical Exam   Triage Vital Signs: ED Triage Vitals  Encounter Vitals Group     BP 10/01/23 0909 (!) 114/51     Systolic BP Percentile --      Diastolic BP Percentile --      Pulse Rate 10/01/23 0910 80     Resp 10/01/23 0912 17     Temp 10/01/23 0915 99.8 F (37.7 C)     Temp Source 10/01/23 0915 Oral     SpO2 10/01/23 0910 98 %     Weight 10/01/23 0912 229 lb 15 oz (104.3 kg)     Height --      Head Circumference --      Peak Flow --      Pain Score 10/01/23 0912 0     Pain Loc --      Pain Education --      Exclude from Growth Chart --     Most recent vital signs: Vitals:   10/01/23 0915 10/01/23 0915  BP:    Pulse: 82   Resp: (!) 24   Temp:  99.8 F (37.7 C)  SpO2: 99%      General: Awake, no distress.  CV:  Good peripheral perfusion.  Resp:  Normal effort.  Abd:  No distention.  Soft and nontender Other:  No redness of bilateral AKA's.  Right femoral catheter noted Aox2 equal strength through no obvious cn deficits   ED Results / Procedures / Treatments   Labs (all labs ordered are listed, but only abnormal results are displayed) Labs Reviewed  RESP PANEL BY RT-PCR (RSV, FLU A&B,  COVID)  [id number]  CULTURE, BLOOD (ROUTINE X 2)  CULTURE, BLOOD (ROUTINE X 2)  LACTIC ACID, PLASMA  LACTIC ACID, PLASMA  COMPREHENSIVE METABOLIC PANEL WITH GFR  CBC WITH DIFFERENTIAL/PLATELET  PROTIME-INR  URINALYSIS, W/ REFLEX TO CULTURE (INFECTION SUSPECTED)  BRAIN NATRIURETIC PEPTIDE     EKG  My interpretation of EKG:  Initial EKG is a lot of artifact on it difficult to interpret will get a repeat  Sinus rate of 82 without any ST elevation or T wave inversions noted in aVL, normal intervals  RADIOLOGY I have reviewed the xray personally and interpreted patient has some patchiness noted bilaterally   PROCEDURES:  Critical Care performed: Yes, see critical care procedure note(s)  .1-3 Lead EKG Interpretation  Performed by: Concha Se, MD Authorized by: Concha Se, MD     Interpretation: normal     ECG rate:  80   ECG rate assessment: normal     Rhythm: sinus rhythm  Ectopy: none     Conduction: normal   .Critical Care  Performed by: Lubertha Rush, MD Authorized by: Lubertha Rush, MD   Critical care provider statement:    Critical care time (minutes):  30   Critical care was necessary to treat or prevent imminent or life-threatening deterioration of the following conditions:  Respiratory failure and sepsis   Critical care was time spent personally by me on the following activities:  Development of treatment plan with patient or surrogate, discussions with consultants, evaluation of patient's response to treatment, examination of patient, ordering and review of laboratory studies, ordering and review of radiographic studies, ordering and performing treatments and interventions, pulse oximetry, re-evaluation of patient's condition and review of old charts    MEDICATIONS ORDERED IN ED: Medications  vancomycin (VANCOCIN) IVPB 1000 mg/200 mL premix (1,000 mg Intravenous New Bag/Given 10/01/23 0945)  levofloxacin (LEVAQUIN) IVPB 750 mg (has no administration in  time range)     IMPRESSION / MDM / ASSESSMENT AND PLAN / ED COURSE  I reviewed the triage vital signs and the nursing notes.   Patient's presentation is most consistent with acute presentation with potential threat to life or bodily function.   Patient comes in with concerns for fever.  Sepsis alert called.  Suspect that this is related to pneumonia COVID, flu, RSV will get testing to evaluate.  Patient is a dialysis patient difficult to assess fluid no lower extremities noted.  X-ray with some possible patchiness noted bilaterally.  Will hold off on fluid resuscitation secondary to this.  Supple neck, well-appearing doubt meningitis. Pt alert and answering questions denies fall to suggest brain bleed. NO stroke on exam.   BNP is elevated with patient's known history of dialysis.  COVID, flu, RSV negative.  CMP shows potassium of 3.0.  CBC shows elevated white count.  10:43 AM repeat evaluation the abdomen remains soft and nontender low suspicion for acute abdominal process.  I reevaluated patient and family reports she is at bit closer to baseline self.  Will admit to the hospitalist due to new oxygen elevation.  I have also messaged nephrology so patient get dialysis.  Patient has some edema and I suspect there could be some pneumonia underneath it versus bacteremia given patient is a dialysis patient.  Patient admitted to the hospitalist   The patient is on the cardiac monitor to evaluate for evidence of arrhythmia and/or significant heart rate changes.      FINAL CLINICAL IMPRESSION(S) / ED DIAGNOSES   Final diagnoses:  Acute respiratory failure with hypoxia (HCC)  Sepsis, due to unspecified organism, unspecified whether acute organ dysfunction present [facility])     Rx / DC Orders   ED Discharge Orders     None        Note:  This document was prepared using Dragon voice recognition software and may include unintentional dictation errors.   Lubertha Rush, MD 10/01/23  1044    Lubertha Rush, MD 10/01/23 1130

## 2023-10-01 NOTE — H&P (Addendum)
 History and Physical    Patient: Amy Wu:[id number] DOB: [birth year] DOA: 10/01/2023 DOS: the patient was seen and examined on 10/01/2023 PCP: Clovis Pu, Arturo Morton, DO  Patient coming from: Home  Chief Complaint:  Chief Complaint  Patient presents with   Altered Mental Status   HPI: Amy Wu is a 73 y.o. female with medical history significant of end-stage renal disease on hemodialysis Monday Wednesday Friday, type 2 diabetes, hyperlipidemia, peripheral vascular disease presenting with cephalopathy, acute respiratory with hypoxia, sepsis, volume overload, missed hemodialysis.  Per report, patient with generalized malaise and fevers over past 24 hours.  Worsening confusion at home per family.  Minimal cough.  Mild shortness of breath.  Positive fatigue.  No abdominal pain.?  Transient diarrhea.  Missed hemodialysis yesterday secondary to symptoms.  No focal hemiparesis.  Worsening confusion and fatigue at home.  Had Tmax at home today around 103.  Non-smoker.  No reported alcohol or illicit drug use. Presented to the ER Tmax 99, heart rate stable.  Blood pressure 70s to 100s.  Satting in the mid 90s on 4 L nasal cannula.  White count 12.3, hemoglobin 11.6, platelets 105, creatinine 7.42, glucose 180, potassium 3.  Lactate within normal limits.  BNP 1436.  Chest x-ray with left basilar atelectasis and vascular congestion. Review of Systems: As mentioned in the history of present illness. All other systems reviewed and are negative. Past Medical History:  Diagnosis Date   Arthritis    Chronic kidney disease    Complication of anesthesia    Diabetes mellitus without complication (HCC)    Hyperlipidemia    Peripheral vascular disease (HCC)    Past Surgical History:  Procedure Laterality Date   A/V SHUNT INTERVENTION Left 11/20/2019   Procedure: A/V SHUNT INTERVENTION (Left Thigh);  Surgeon: Annice Needy, MD;  Location: [facility];  Service: Cardiovascular;  Laterality: Left;    A/V SHUNTOGRAM Left 02/16/2017   Procedure: A/V Shuntogram;  Surgeon: Annice Needy, MD;  Location: [facility];  Service: Cardiovascular;  Laterality: Left;   A/V SHUNTOGRAM Left 12/13/2017   Procedure: A/V SHUNTOGRAM;  Surgeon: Annice Needy, MD;  Location: [facility];  Service: Cardiovascular;  Laterality: Left;   A/V SHUNTOGRAM Left 12/05/2018   Procedure: A/V SHUNTOGRAM;  Surgeon: Annice Needy, MD;  Location: [facility];  Service: Cardiovascular;  Laterality: Left;   A/V SHUNTOGRAM Left 06/03/2020   Procedure: A/V SHUNTOGRAM;  Surgeon: Annice Needy, MD;  Location: [facility];  Service: Cardiovascular;  Laterality: Left;   A/V SHUNTOGRAM Left 05/24/2021   Procedure: A/V SHUNTOGRAM;  Surgeon: Renford Dills, MD;  Location: [facility];  Service: Cardiovascular;  Laterality: Left;  Thigh Graft   ABOVE KNEE LEG AMPUTATION     AV FISTULA PLACEMENT Bilateral    AV FISTULA PLACEMENT Left 07/01/2015   Procedure: INSERTION OF ARTERIOVENOUS (AV) GRAFT THIGH (ARTEGRAFT);  Surgeon: Annice Needy, MD;  Location: [facility];  Service: Vascular;  Laterality: Left;   BELOW KNEE LEG AMPUTATION Left    BREAST BIOPSY     CATARACT EXTRACTION W/PHACO Left 07/10/2022   Procedure: CATARACT EXTRACTION PHACO AND INTRAOCULAR LENS PLACEMENT (IOC) LEFT DIABETIC  12.98  01:13.7;  Surgeon: Nevada Crane, MD;  Location: [facility];  Service: Ophthalmology;  Laterality: Left;   DIALYSIS/PERMA CATHETER INSERTION N/A 05/26/2021   Procedure: DIALYSIS/PERMA CATHETER INSERTION;  Surgeon: Annice Needy, MD;  Location: [facility]  INVASIVE CV LAB;  Service: Cardiovascular;  Laterality: N/A;   DIALYSIS/PERMA CATHETER INSERTION N/A 07/05/2022   Procedure: DIALYSIS/PERMA CATHETER INSERTION;  Surgeon: Celso College, MD;  Location: [facility];  Service: Cardiovascular;  Laterality: N/A;   DIALYSIS/PERMA CATHETER INSERTION N/A 07/28/2022   Procedure: DIALYSIS/PERMA CATHETER  INSERTION;  Surgeon: Celso College, MD;  Location: [facility];  Service: Cardiovascular;  Laterality: N/A;   DIALYSIS/PERMA CATHETER INSERTION N/A 09/21/2022   Procedure: DIALYSIS/PERMA CATHETER INSERTION;  Surgeon: Celso College, MD;  Location: [facility];  Service: Cardiovascular;  Laterality: N/A;   DIALYSIS/PERMA CATHETER INSERTION N/A 02/15/2023   Procedure: DIALYSIS/PERMA CATHETER INSERTION;  Surgeon: Celso College, MD;  Location: [facility];  Service: Cardiovascular;  Laterality: N/A;   DIALYSIS/PERMA CATHETER INSERTION N/A 04/09/2023   Procedure: DIALYSIS/PERMA CATHETER INSERTION;  Surgeon: Jackquelyn Mass, MD;  Location: [facility];  Service: Cardiovascular;  Laterality: N/A;   DIALYSIS/PERMA CATHETER REPAIR Right 09/24/2023   Procedure: DIALYSIS/PERMA CATHETER REPAIR;  Surgeon: Jackquelyn Mass, MD;  Location: [facility];  Service: Cardiovascular;  Laterality: Right;   ENDARTERECTOMY FEMORAL Left 07/01/2015   Procedure: SUPERFICIAL FEMORAL ENDARTERECTOMY ;  Surgeon: Celso College, MD;  Location: [facility];  Service: Vascular;  Laterality: Left;   EXCHANGE OF A DIALYSIS CATHETER  02/11/2015   Procedure: Exchange Of A Dialysis Catheter;  Surgeon: Celso College, MD;  Location: [facility] INVASIVE CV [facility];  Service: Cardiovascular;;   LEG AMPUTATION ABOVE KNEE Right    PERIPHERAL VASCULAR CATHETERIZATION N/A 02/11/2015   Procedure: Dialysis/Perma Catheter Insertion;  Surgeon: Celso College, MD;  Location: [facility];  Service: Cardiovascular;  Laterality: N/A;   PERIPHERAL VASCULAR CATHETERIZATION N/A 05/11/2015   Procedure: Dialysis/Perma Catheter Insertion, exchange;  Surgeon: Jackquelyn Mass, MD;  Location: [facility];  Service: Cardiovascular;  Laterality: N/A;   PERIPHERAL VASCULAR CATHETERIZATION N/A 05/18/2015   Procedure: Dialysis/Perma Catheter Insertion;  Surgeon: Jackquelyn Mass, MD;  Location: [facility];  Service:  Cardiovascular;  Laterality: N/A;   PERIPHERAL VASCULAR CATHETERIZATION Left 07/19/2015   Procedure: A/V Shuntogram/Fistulagram;  Surgeon: Celso College, MD;  Location: [facility];  Service: Cardiovascular;  Laterality: Left;   PERIPHERAL VASCULAR CATHETERIZATION N/A 07/19/2015   Procedure: A/V Shunt Intervention;  Surgeon: Celso College, MD;  Location: [facility];  Service: Cardiovascular;  Laterality: N/A;   PERIPHERAL VASCULAR CATHETERIZATION N/A 08/26/2015   Procedure: Dialysis/Perma Catheter Removal;  Surgeon: Celso College, MD;  Location: [facility];  Service: Cardiovascular;  Laterality: N/A;   PERIPHERAL VASCULAR THROMBECTOMY N/A 12/29/2016   Procedure: Peripheral Vascular Thrombectomy;  Surgeon: Jackquelyn Mass, MD;  Location: [facility];  Service: Cardiovascular;  Laterality: N/A;   Social History:  reports that she has never smoked. She has never used smokeless tobacco. She reports that she does not drink alcohol and does not use drugs.  Allergies  Allergen Reactions   Ancef [Cefazolin] Palpitations   Contrast Media [Iodinated Contrast Media] Palpitations    Family History  Problem Relation Age of Onset   Breast cancer Neg Hx     Prior to Admission medications   Medication Sig Start Date End Date Taking? Authorizing Provider  acetaminophen (TYLENOL) 500 MG tablet Take 1,000 mg by mouth every 8 (eight) hours as needed for moderate pain.   Yes [provider]  aspirin EC 81 MG tablet Take  81 mg by mouth daily. 11/11/20  Yes [provider]  atorvastatin (LIPITOR) 40 MG tablet Take 40 mg by mouth daily.   Yes [provider]  gabapentin (NEURONTIN) 100 MG capsule Take 100 mg by mouth 3 (three) times daily as needed. M/W/F after dialysis PRN for leg/thigh cramps   Yes [provider]  JANUVIA 25 MG tablet Take 25 mg by mouth daily. 11/02/20  Yes [provider]  LOKELMA 10 g PACK packet Take 1 packet by  mouth as directed. Taken on Sat and Sundays midday 09/25/23  Yes [provider]  loratadine (CLARITIN) 10 MG tablet Take 10 mg by mouth daily as needed for allergies.   Yes [provider]  Multiple Vitamins-Minerals (PRESERVISION AREDS 2) CAPS Take 1 capsule by mouth 2 (two) times daily. 09/18/23  Yes [provider]  STOOL SOFTENER 100 MG capsule Take 100 mg by mouth 2 (two) times daily. 09/18/23  Yes [provider]  calcitRIOL (ROCALTROL) 0.25 MCG capsule Take 0.25 mcg by mouth daily. Take 3 capsules by mouth three times week at dialysis    [provider]  cinacalcet (SENSIPAR) 30 MG tablet Take 30 mg by mouth daily with supper.     [provider]  COMFORT EZ PEN NEEDLES 31G X 6 MM MISC Inject into the skin. 03/17/21   [provider]  ferrous sulfate 325 (65 FE) MG tablet Take 325 mg by mouth every other day. Patient not taking: Reported on 02/15/2023    [provider]  fluticasone (FLONASE) 50 MCG/ACT nasal spray Place 1 spray into both nostrils daily. Patient not taking: Reported on 06/30/2022    [provider]  glucose 4 GM chewable tablet Chew 1 tablet by mouth as needed for low blood sugar. If blood sugar less than 70. Chew 3 tablets and recheck blood sugar in 15 minutes Patient not taking: Reported on 06/30/2022    [provider]  glucose blood test strip 1 each by Other route as needed for other. Use as instructed    [provider]  ketotifen (ZADITOR) 0.025 % ophthalmic solution Place 1 drop into both eyes 2 (two) times daily. Patient not taking: Reported on 04/09/2023    [provider]  lanthanum (FOSRENOL) 1000 MG chewable tablet Chew 1,000 mg by mouth 2 (two) times daily with a meal.     [provider]  ReliOn Lancet Devices 30G MISC Apply topically. Patient not taking: Reported on 06/30/2022 03/17/21   [provider]  senna-docusate (SENOKOT-S) 8.6-50 MG  tablet Take 1-2 tablets by mouth at bedtime as needed for mild constipation or moderate constipation. 1-2 tablets at bedtime if needed Patient not taking: Reported on 07/28/2022    [provider]  tiZANidine (ZANAFLEX) 4 MG tablet Take 4 mg by mouth 2 (two) times daily. 09/25/23   [provider]  TRUEplus Lancets 28G MISC Apply topically. 03/17/21   [provider]  Stann Ore INSULIN SYRINGE 31G X 5/16" 0.3 ML MISC SMARTSIG:Injection Daily 03/31/21   [provider]    Physical Exam: Vitals:   10/01/23 1300 10/01/23 1330 10/01/23 1358 10/01/23 1359  BP: 106/64 (!) 117/93 (!) 73/49 (!) 92/57  Pulse: 71 84 (!) 53 77  Resp: 18 (!) 22 20 19   Temp:      TempSrc:      SpO2: 99% 99% 100% 100%  Weight:       Physical Exam Constitutional:      Appearance: She  is obese.     Comments: Mild generalized lethargy    HENT:     Head: Normocephalic and atraumatic.     Nose: Nose normal.     Mouth/Throat:     Mouth: Mucous membranes are moist.  Cardiovascular:     Rate and Rhythm: Normal rate.  Pulmonary:     Effort: Pulmonary effort is normal.  Abdominal:     General: Bowel sounds are normal.  Musculoskeletal:     Cervical back: Normal range of motion.     Comments: Bilateral AKA  Skin:    General: Skin is warm.  Neurological:     General: No focal deficit present.  Psychiatric:        Mood and Affect: Mood normal.     Data Reviewed:  There are no new results to review at this time.  DG Chest Port 1 View CLINICAL DATA:  Questionable sepsis - evaluate for abnormality  Altered mental status.  Fever.  Hypoxemia.  EXAM: PORTABLE CHEST 1 VIEW  COMPARISON:  Radiographs 05/23/2021 and 07/20/2020.  FINDINGS: 0932 hours. Lower lung volumes with probable vascular congestion and mildly increased left basilar atelectasis. No overt pulmonary edema or confluent airspace disease. There may be a small left pleural effusion. The heart size and mediastinal  contours appears stable with aortic atherosclerosis. No acute osseous findings are seen. Telemetry leads overlie the chest.  IMPRESSION: Lower lung volumes with probable vascular congestion and mildly increased left basilar atelectasis. No definite evidence of pneumonia.  Electronically Signed   By: Elmon Hagedorn M.D.   On: 10/01/2023 10:12  Lab Results  Component Value Date   WBC 12.3 (H) 10/01/2023   HGB 11.6 (L) 10/01/2023   HCT 35.9 (L) 10/01/2023   MCV 81.2 10/01/2023   PLT 105 (L) 10/01/2023   Last metabolic panel Lab Results  Component Value Date   GLUCOSE 180 (H) 10/01/2023   NA 138 10/01/2023   K 3.0 (L) 10/01/2023   CL 101 10/01/2023   CO2 23 10/01/2023   BUN 31 (H) 10/01/2023   CREATININE 7.42 (H) 10/01/2023   GFRNONAA 5 (L) 10/01/2023   CALCIUM 8.6 (L) 10/01/2023   PHOS 2.5 05/24/2021   PROT 6.7 10/01/2023   ALBUMIN 3.1 (L) 10/01/2023   BILITOT 0.9 10/01/2023   ALKPHOS 70 10/01/2023   AST 25 10/01/2023   ALT 7 10/01/2023   ANIONGAP 14 10/01/2023    Assessment and Plan: * Sepsis (HCC) Meeting sepsis criteria with Tmax of 103 prior to arrival, respiration rate in mid 20s, white count 12.2 Lactate within normal limits Defer LR IV fluids in the setting of ESRD and baseline volume overload Noted acute onset respiratory failure requiring O2 as well as lobar pneumonia on chest x-ray Placed on IV Levaquin for infectious coverage in setting of penicillin allergy Blood and respiratory cultures Monitor  Acute respiratory failure with hypoxia (HCC) PNA Decompensated respiratory failure requiring 2 L nasal cannula in the setting of sepsis and lobar pneumonia Suspect mild volume overload associated with missed hemodialysis also contributory BNP 1436  Pending hemodialysis session now IV Levaquin Blood and respiratory cultures 2D ECHO  Continue supplemental oxygen as needed   Encephalopathy Positive generalized lethargy on presentation Suspect likely  multifactorial contributions of volume overload, hypoxia, pneumonia as well as uremia associated with ESRD Pending hemodialysis Supplemental oxygen IV antibiotics for pneumonia coverage Check ammonia level Nonfocal neuroexam Monitor   ESRD on dialysis [facility]) Baseline ESRD on hemodialysis Monday Wednesday Friday Pending hemodialysis today in  setting of missed session  Monitor    Hyperlipidemia, unspecified Cont statin   Type 2 diabetes mellitus (HCC) Blood sugar 180s SSI  A1C  Monitor      Greater than 50% was spent in counseling and coordination of care with patient Critical care time: 60+ minutes    Advance Care Planning:   Code Status: Limited: Do not attempt resuscitation (DNR) -DNR-LIMITED -Do Not Intubate/DNI    Consults: Nephrology  Family Communication: Family at the bedside   Severity of Illness: The appropriate patient status for this patient is OBSERVATION. Observation status is judged to be reasonable and necessary in order to provide the required intensity of service to ensure the patient's safety. The patient's presenting symptoms, physical exam findings, and initial radiographic and laboratory data in the context of their medical condition is felt to place them at decreased risk for further clinical deterioration. Furthermore, it is anticipated that the patient will be medically stable for discharge from the hospital within 2 midnights of admission.   Author: Corrinne Din, MD 10/01/2023 2:12 PM  For on call review [email or web address].

## 2023-10-01 NOTE — Progress Notes (Signed)
 [facility]  ROUNDING NOTE   Subjective:   Amy Wu  is a 73 y.o.  female with past medical history of hypertension, diabetes, PVD, dyslipidemia, and end-stage renal disease on dialysis. She presents to the ED via EMS for evaluation of altered mental status and fever and has been admitted under observation for Acute respiratory failure with hypoxia (HCC) [J96.01] Sepsis (HCC) [A41.9] Sepsis, due to unspecified organism, unspecified whether acute organ dysfunction present [facility]) [A41.9]  Patient is known to our practice and receives outpatient dialysis treatments at [facility] on a MWF schedule, supervised by [facility] physicians. Denies any recent dialysis treatments. States she is on room air at baseline, now on 6L. Her mentation appears to have improved. States she didn't know who she was this morning.   Labs on ED arrival significant for potassium 3.0 and BNP 1436. Elevated white count 12.3 with Hgb 11.6. Respiratory panel negative. Chest xray shows probable vascular congestion with left basilar atelectasis.   We have been consulted to provide dialysis during this admission.    Objective:  Vital signs in last 24 hours:  Temp:  [99 F (37.2 C)-99.8 F (37.7 C)] 99 F (37.2 C) (04/14 1211) Pulse Rate:  [47-98] 72 (04/14 1530) Resp:  [15-25] 17 (04/14 1530) BP: (73-134)/(23-93) 78/42 (04/14 1530) SpO2:  [94 %-100 %] 94 % (04/14 1530) Weight:  [104.3 kg] 104.3 kg (04/14 1211)  Weight change:  Filed Weights   10/01/23 0912 10/01/23 1211  Weight: 104.3 kg 104.3 kg    Intake/Output: No intake/output data recorded.   Intake/Output this shift:  No intake/output data recorded.  Physical Exam: General: NAD  Head: Normocephalic, atraumatic. Moist oral mucosal membranes  Eyes: Anicteric  Lungs:  Clear to auscultation, normal effort  Heart: Regular rate and rhythm  Abdomen:  Soft, nontender  Extremities:  No peripheral edema. Bilat AKA  Neurologic: Alert,  moving all four extremities  Skin: No lesions  Access: Rt thigh permcath    Basic Metabolic Panel: Recent Labs  Lab 10/01/23 0912  NA 138  K 3.0*  CL 101  CO2 23  GLUCOSE 180*  BUN 31*  CREATININE 7.42*  CALCIUM 8.6*    Liver Function Tests: Recent Labs  Lab 10/01/23 0912  AST 25  ALT 7  ALKPHOS 70  BILITOT 0.9  PROT 6.7  ALBUMIN 3.1*   No results for input(s): "LIPASE", "AMYLASE" in the last 168 hours. No results for input(s): "AMMONIA" in the last 168 hours.  CBC: Recent Labs  Lab 10/01/23 0912  WBC 12.3*  NEUTROABS 10.7*  HGB 11.6*  HCT 35.9*  MCV 81.2  PLT 105*    Cardiac Enzymes: No results for input(s): "CKTOTAL", "CKMB", "CKMBINDEX", "TROPONINI" in the last 168 hours.  BNP: Invalid input(s): "POCBNP"  CBG: Recent Labs  Lab 09/24/23 1711  GLUCAP 108*    Microbiology: Results for orders placed or performed during the hospital encounter of 10/01/23  Resp panel by RT-PCR (RSV, Flu A&B, Covid) Anterior Nasal Swab     Status: None   Collection Time: 10/01/23  9:12 AM   Specimen: Anterior Nasal Swab  Result Value Ref Range Status   SARS Coronavirus 2 by RT PCR NEGATIVE NEGATIVE Final    Comment: (NOTE) SARS-CoV-2 target nucleic acids are NOT DETECTED.  The SARS-CoV-2 RNA is generally detectable in upper respiratory specimens during the acute phase of infection. The lowest concentration of SARS-CoV-2 viral copies this assay can detect is 138 copies/mL. A negative result does  not preclude SARS-Cov-2 infection and should not be used as the sole basis for treatment or other patient management decisions. A negative result may occur with  improper specimen collection/handling, submission of specimen other than nasopharyngeal swab, presence of viral mutation(s) within the areas targeted by this assay, and inadequate number of viral copies(<138 copies/mL). A negative result must be combined with clinical observations, patient history, and  epidemiological information. The expected result is Negative.  Fact Sheet for Patients:  BloggerCourse.com  Fact Sheet for Healthcare Providers:  SeriousBroker.it  This test is no t yet approved or cleared by the [place] FDA and  has been authorized for detection and/or diagnosis of SARS-CoV-2 by FDA under an Emergency Use Authorization (EUA). This EUA will remain  in effect (meaning this test can be used) for the duration of the COVID-19 declaration under Section 564(b)(1) of the Act, 21 U.S.C.section 360bbb-3(b)(1), unless the authorization is terminated  or revoked sooner.       Influenza A by PCR NEGATIVE NEGATIVE Final   Influenza B by PCR NEGATIVE NEGATIVE Final    Comment: (NOTE) The Xpert Xpress SARS-CoV-2/FLU/RSV plus assay is intended as an aid in the diagnosis of influenza from Nasopharyngeal swab specimens and should not be used as a sole basis for treatment. Nasal washings and aspirates are unacceptable for Xpert Xpress SARS-CoV-2/FLU/RSV testing.  Fact Sheet for Patients: BloggerCourse.com  Fact Sheet for Healthcare Providers: SeriousBroker.it  This test is not yet approved or cleared by the [place] FDA and has been authorized for detection and/or diagnosis of SARS-CoV-2 by FDA under an Emergency Use Authorization (EUA). This EUA will remain in effect (meaning this test can be used) for the duration of the COVID-19 declaration under Section 564(b)(1) of the Act, 21 U.S.C. section 360bbb-3(b)(1), unless the authorization is terminated or revoked.     Resp Syncytial Virus by PCR NEGATIVE NEGATIVE Final    Comment: (NOTE) Fact Sheet for Patients: BloggerCourse.com  Fact Sheet for Healthcare Providers: SeriousBroker.it  This test is not yet approved or cleared by the [place] FDA and has been  authorized for detection and/or diagnosis of SARS-CoV-2 by FDA under an Emergency Use Authorization (EUA). This EUA will remain in effect (meaning this test can be used) for the duration of the COVID-19 declaration under Section 564(b)(1) of the Act, 21 U.S.C. section 360bbb-3(b)(1), unless the authorization is terminated or revoked.  Performed at [facility], [address] [place], [place] [address]     Coagulation Studies: Recent Labs    10/01/23 0912  LABPROT 17.4*  INR 1.4*    Urinalysis: No results for input(s): "COLORURINE", "LABSPEC", "PHURINE", "GLUCOSEU", "HGBUR", "BILIRUBINUR", "KETONESUR", "PROTEINUR", "UROBILINOGEN", "NITRITE", "LEUKOCYTESUR" in the last 72 hours.  Invalid input(s): "APPERANCEUR"    Imaging: DG Chest Port 1 View Result Date: 10/01/2023 CLINICAL DATA:  Questionable sepsis - evaluate for abnormality Altered mental status.  Fever.  Hypoxemia. EXAM: PORTABLE CHEST 1 VIEW COMPARISON:  Radiographs 05/23/2021 and 07/20/2020. FINDINGS: 0932 hours. Lower lung volumes with probable vascular congestion and mildly increased left basilar atelectasis. No overt pulmonary edema or confluent airspace disease. There may be a small left pleural effusion. The heart size and mediastinal contours appears stable with aortic atherosclerosis. No acute osseous findings are seen. Telemetry leads overlie the chest. IMPRESSION: Lower lung volumes with probable vascular congestion and mildly increased left basilar atelectasis. No definite evidence of pneumonia. Electronically Signed   By: Carey Bullocks M.D.   On: 10/01/2023 10:12  Medications:    [START ON 10/02/2023] azithromycin     [START ON 10/03/2023] cefTRIAXone (ROCEPHIN)  IV      Chlorhexidine Gluconate Cloth  6 each Topical Q0600   heparin injection (subcutaneous)  5,000 Units Subcutaneous Q8H   insulin aspart  0-9 Units Subcutaneous TID WC   tiZANidine  2 mg Oral BID   acetaminophen, ondansetron  **OR** ondansetron (ZOFRAN) IV  Assessment/ Plan:  Amy Wu is a 73 y.o.  female with past medical history of hypertension, diabetes, PVD, dyslipidemia, and end-stage renal disease on dialysis. She presents to the ED via EMS for evaluation of altered mental status and fever and has been admitted under observation for Acute respiratory failure with hypoxia (HCC) [J96.01] Sepsis (HCC) [A41.9] Sepsis, due to unspecified organism, unspecified whether acute organ dysfunction present (HCC) [A41.9]  [facility]/MWF/Rt thigh Permcath  Sepsis, unknown source at this time. Admitted with fever 103, and elevated white count, 12.2. Blood cultures pending. Has received Vancomycin and levaquin.   2. End stage renal disease on hemodialysis.  Will complete treatment today, due to outpatient schedule. Next treatment scheduled for Wednesday.   3. Anemia of chronic kidney disease Lab Results  Component Value Date   HGB 11.6 (L) 10/01/2023    Hgb at goal. Will monitor for need of ESA.   4. Secondary Hyperparathyroidism: with outpatient labs: None available   Lab Results  Component Value Date   CALCIUM 8.6 (L) 10/01/2023   PHOS 2.5 05/24/2021    Will continue to monitor bone minerals during this admission. Currently prescribed calcitriol and cinacalcet outpatient.    LOS: 0 Nicolis Boody 4/14/20253:36 PM

## 2023-10-01 NOTE — Consult Note (Signed)
 CODE SEPSIS - PHARMACY COMMUNICATION  **Broad-spectrum antimicrobials should be administered within one hour of sepsis diagnosis**  Time Code Sepsis call or page was received: 0913  Antibiotics ordered: Levofloxacin, Vancomycin  Time of first antibiotic administration: 0945  Additional action taken by pharmacy: N/A  If necessary, name of provider/nurse contacted: N/A    Will M. Alva Jewels, PharmD Clinical Pharmacist 10/01/2023 10:03 AM

## 2023-10-01 NOTE — Assessment & Plan Note (Signed)
 Blood sugar 180s  SSI  A1C Monitor

## 2023-10-01 NOTE — Assessment & Plan Note (Addendum)
 PNA Decompensated respiratory failure requiring 2 L nasal cannula in the setting of sepsis and lobar pneumonia Suspect mild volume overload associated with missed hemodialysis also contributory BNP 1436  Pending hemodialysis session now IV Levaquin Blood and respiratory cultures 2D ECHO  Continue supplemental oxygen as needed

## 2023-10-01 NOTE — Assessment & Plan Note (Signed)
 Meeting sepsis criteria with Tmax of 103 prior to arrival, respiration rate in mid 20s, white count 12.2 Lactate within normal limits Defer LR IV fluids in the setting of ESRD and baseline volume overload Noted acute onset respiratory failure requiring O2 as well as lobar pneumonia on chest x-ray Placed on IV Levaquin for infectious coverage in setting of penicillin allergy Blood and respiratory cultures Monitor

## 2023-10-01 NOTE — Assessment & Plan Note (Signed)
 Positive generalized lethargy on presentation Suspect likely multifactorial contributions of volume overload, hypoxia, pneumonia as well as uremia associated with ESRD Pending hemodialysis Supplemental oxygen IV antibiotics for pneumonia coverage Check ammonia level Nonfocal neuroexam Monitor

## 2023-10-01 NOTE — Assessment & Plan Note (Signed)
 Cont statin

## 2023-10-02 ENCOUNTER — Observation Stay (HOSPITAL_COMMUNITY)
Admit: 2023-10-02 | Discharge: 2023-10-02 | Disposition: A | Discharge status: Home / Self Care | Payer: Medicare (Managed Care) | Attending: Family Medicine | Admitting: Family Medicine

## 2023-10-02 ENCOUNTER — Encounter
Admission: EM | Disposition: A | Discharge status: Home Health | Payer: Self-pay | Source: Home / Self Care | Attending: Osteopathic Medicine

## 2023-10-02 DIAGNOSIS — F0150 Vascular dementia without behavioral disturbance: Secondary | ICD-10-CM | POA: Diagnosis present

## 2023-10-02 DIAGNOSIS — N2581 Secondary hyperparathyroidism of renal origin: Secondary | ICD-10-CM | POA: Diagnosis present

## 2023-10-02 DIAGNOSIS — R7881 Bacteremia: Secondary | ICD-10-CM | POA: Diagnosis not present

## 2023-10-02 DIAGNOSIS — I483 Typical atrial flutter: Secondary | ICD-10-CM | POA: Diagnosis not present

## 2023-10-02 DIAGNOSIS — G928 Other toxic encephalopathy: Secondary | ICD-10-CM | POA: Diagnosis not present

## 2023-10-02 DIAGNOSIS — J181 Lobar pneumonia, unspecified organism: Secondary | ICD-10-CM | POA: Diagnosis present

## 2023-10-02 DIAGNOSIS — Z6841 Body Mass Index (BMI) 40.0 and over, adult: Secondary | ICD-10-CM | POA: Diagnosis not present

## 2023-10-02 DIAGNOSIS — I428 Other cardiomyopathies: Secondary | ICD-10-CM

## 2023-10-02 DIAGNOSIS — I21A1 Myocardial infarction type 2: Secondary | ICD-10-CM | POA: Diagnosis not present

## 2023-10-02 DIAGNOSIS — G9340 Encephalopathy, unspecified: Secondary | ICD-10-CM | POA: Diagnosis not present

## 2023-10-02 DIAGNOSIS — N186 End stage renal disease: Secondary | ICD-10-CM | POA: Diagnosis present

## 2023-10-02 DIAGNOSIS — B9689 Other specified bacterial agents as the cause of diseases classified elsewhere: Secondary | ICD-10-CM | POA: Diagnosis not present

## 2023-10-02 DIAGNOSIS — Z992 Dependence on renal dialysis: Secondary | ICD-10-CM | POA: Diagnosis not present

## 2023-10-02 DIAGNOSIS — I2510 Atherosclerotic heart disease of native coronary artery without angina pectoris: Secondary | ICD-10-CM | POA: Diagnosis not present

## 2023-10-02 DIAGNOSIS — D696 Thrombocytopenia, unspecified: Secondary | ICD-10-CM | POA: Diagnosis present

## 2023-10-02 DIAGNOSIS — Y848 Other medical procedures as the cause of abnormal reaction of the patient, or of later complication, without mention of misadventure at the time of the procedure: Secondary | ICD-10-CM | POA: Diagnosis present

## 2023-10-02 DIAGNOSIS — E66813 Obesity, class 3: Secondary | ICD-10-CM | POA: Diagnosis present

## 2023-10-02 DIAGNOSIS — A419 Sepsis, unspecified organism: Secondary | ICD-10-CM | POA: Diagnosis not present

## 2023-10-02 DIAGNOSIS — I214 Non-ST elevation (NSTEMI) myocardial infarction: Secondary | ICD-10-CM | POA: Diagnosis not present

## 2023-10-02 DIAGNOSIS — J9811 Atelectasis: Secondary | ICD-10-CM | POA: Diagnosis present

## 2023-10-02 DIAGNOSIS — T827XXA Infection and inflammatory reaction due to other cardiac and vascular devices, implants and grafts, initial encounter: Secondary | ICD-10-CM

## 2023-10-02 DIAGNOSIS — G9341 Metabolic encephalopathy: Secondary | ICD-10-CM | POA: Diagnosis present

## 2023-10-02 DIAGNOSIS — E871 Hypo-osmolality and hyponatremia: Secondary | ICD-10-CM | POA: Diagnosis present

## 2023-10-02 DIAGNOSIS — E1122 Type 2 diabetes mellitus with diabetic chronic kidney disease: Secondary | ICD-10-CM | POA: Diagnosis present

## 2023-10-02 DIAGNOSIS — Z1152 Encounter for screening for COVID-19: Secondary | ICD-10-CM | POA: Diagnosis not present

## 2023-10-02 DIAGNOSIS — J9601 Acute respiratory failure with hypoxia: Secondary | ICD-10-CM | POA: Diagnosis not present

## 2023-10-02 DIAGNOSIS — I200 Unstable angina: Secondary | ICD-10-CM | POA: Diagnosis not present

## 2023-10-02 DIAGNOSIS — R9431 Abnormal electrocardiogram [ECG] [EKG]: Secondary | ICD-10-CM | POA: Diagnosis not present

## 2023-10-02 DIAGNOSIS — R6521 Severe sepsis with septic shock: Secondary | ICD-10-CM | POA: Diagnosis present

## 2023-10-02 DIAGNOSIS — D631 Anemia in chronic kidney disease: Secondary | ICD-10-CM | POA: Diagnosis present

## 2023-10-02 DIAGNOSIS — T80211A Bloodstream infection due to central venous catheter, initial encounter: Secondary | ICD-10-CM | POA: Diagnosis present

## 2023-10-02 DIAGNOSIS — Z66 Do not resuscitate: Secondary | ICD-10-CM | POA: Diagnosis present

## 2023-10-02 DIAGNOSIS — G929 Unspecified toxic encephalopathy: Secondary | ICD-10-CM | POA: Diagnosis present

## 2023-10-02 DIAGNOSIS — I120 Hypertensive chronic kidney disease with stage 5 chronic kidney disease or end stage renal disease: Secondary | ICD-10-CM | POA: Diagnosis present

## 2023-10-02 DIAGNOSIS — A488 Other specified bacterial diseases: Secondary | ICD-10-CM | POA: Diagnosis not present

## 2023-10-02 DIAGNOSIS — A4153 Sepsis due to Serratia: Secondary | ICD-10-CM | POA: Diagnosis present

## 2023-10-02 DIAGNOSIS — I70208 Unspecified atherosclerosis of native arteries of extremities, other extremity: Secondary | ICD-10-CM | POA: Diagnosis present

## 2023-10-02 LAB — BLOOD CULTURE ID PANEL (REFLEXED) - BCID2

## 2023-10-02 LAB — RESPIRATORY PANEL BY PCR

## 2023-10-02 LAB — ECHOCARDIOGRAM COMPLETE
AR max vel: 2.94 cm2
AV Area VTI: 2.65 cm2
AV Area mean vel: 2.62 cm2
AV Mean grad: 5 mmHg
AV Peak grad: 11 mmHg
Ao pk vel: 1.66 m/s
Area-P 1/2: 2.71 cm2
Est EF: >55
MV VTI: 2.34 cm2
S' Lateral: 2.5 cm
Weight: 3523.83 [oz_av]

## 2023-10-02 LAB — CBC
HCT: 34.2 % — ABNORMAL LOW (ref 36.0–46.0)
Hemoglobin: 11.2 g/dL — ABNORMAL LOW (ref 12.0–15.0)
MCH: 26 pg (ref 26.0–34.0)
MCHC: 32.7 g/dL (ref 30.0–36.0)
MCV: 79.5 fL — ABNORMAL LOW (ref 80.0–100.0)
Platelets: 108 10*3/uL — ABNORMAL LOW (ref 150–400)
RBC: 4.3 MIL/uL (ref 3.87–5.11)
RDW: 17.3 % — ABNORMAL HIGH (ref 11.5–15.5)
WBC: 8.1 10*3/uL (ref 4.0–10.5)
nRBC: 0 % (ref 0.0–0.2)

## 2023-10-02 LAB — BLOOD GAS, VENOUS
Acid-base deficit: 0.8 mmol/L (ref 0.0–2.0)
Bicarbonate: 24.3 mmol/L (ref 20.0–28.0)
O2 Saturation: 84.5 %
Patient temperature: 37
pCO2, Ven: 41 mmHg — ABNORMAL LOW (ref 44–60)
pH, Ven: 7.38 (ref 7.25–7.43)
pO2, Ven: 50 mmHg — ABNORMAL HIGH (ref 32–45)

## 2023-10-02 LAB — COMPREHENSIVE METABOLIC PANEL WITH GFR
ALT: 16 U/L (ref 0–44)
AST: 81 U/L — ABNORMAL HIGH (ref 15–41)
Albumin: 2.9 g/dL — ABNORMAL LOW (ref 3.5–5.0)
Alkaline Phosphatase: 68 U/L (ref 38–126)
Anion gap: 11 (ref 5–15)
BUN: 23 mg/dL (ref 8–23)
CO2: 24 mmol/L (ref 22–32)
Calcium: 8.6 mg/dL — ABNORMAL LOW (ref 8.9–10.3)
Chloride: 98 mmol/L (ref 98–111)
Creatinine, Ser: 5.13 mg/dL — ABNORMAL HIGH (ref 0.44–1.00)
GFR, Estimated: 8 mL/min — ABNORMAL LOW (ref >60)
Glucose, Bld: 147 mg/dL — ABNORMAL HIGH (ref 70–99)
Potassium: 3.5 mmol/L (ref 3.5–5.1)
Sodium: 133 mmol/L — ABNORMAL LOW (ref 135–145)
Total Bilirubin: 1 mg/dL (ref 0.0–1.2)
Total Protein: 6.3 g/dL — ABNORMAL LOW (ref 6.5–8.1)

## 2023-10-02 LAB — GLUCOSE, CAPILLARY
Glucose-Capillary: 139 mg/dL — ABNORMAL HIGH (ref 70–99)
Glucose-Capillary: 139 mg/dL — ABNORMAL HIGH (ref 70–99)
Glucose-Capillary: 140 mg/dL — ABNORMAL HIGH (ref 70–99)
Glucose-Capillary: 147 mg/dL — ABNORMAL HIGH (ref 70–99)
Glucose-Capillary: 121 mg/dL — ABNORMAL HIGH (ref 70–99)

## 2023-10-02 LAB — HEMOGLOBIN A1C
Hgb A1c MFr Bld: 6.5 % — ABNORMAL HIGH (ref 4.8–5.6)
Mean Plasma Glucose: 139.85 mg/dL

## 2023-10-02 LAB — HEPATITIS B SURFACE ANTIBODY, QUANTITATIVE: Hep B S AB Quant (Post): 144 m[IU]/mL

## 2023-10-02 LAB — MRSA NEXT GEN BY PCR, NASAL: MRSA by PCR Next Gen: NOT DETECTED

## 2023-10-02 SURGERY — DIALYSIS/PERMA CATHETER REMOVAL
Anesthesia: LOCAL

## 2023-10-02 MED ORDER — GABAPENTIN 100 MG PO CAPS
100.0000 mg | ORAL_CAPSULE | Freq: Three times a day (TID) | ORAL | Status: DC | PRN
  Administered 2023-10-02 – 2023-10-11 (×2): 100 mg via ORAL
  Filled 2023-10-02 (×2): qty 1

## 2023-10-02 MED ORDER — SODIUM CHLORIDE 0.9 % IV SOLN: 250.0000 mL | INTRAVENOUS | Status: DC

## 2023-10-02 MED ORDER — SODIUM CHLORIDE 0.9 % IV SOLN
1.0000 g | INTRAVENOUS | Status: DC
  Administered 2023-10-02 – 2023-10-07 (×6): 1 g via INTRAVENOUS
  Filled 2023-10-02 (×7): qty 10

## 2023-10-02 MED ORDER — PERFLUTREN LIPID MICROSPHERE
1.0000 mL | INTRAVENOUS | Status: AC | PRN
  Administered 2023-10-02: 5 mL via INTRAVENOUS

## 2023-10-02 MED ORDER — SODIUM CHLORIDE 0.9 % IV SOLN
1.0000 g | Freq: Once | INTRAVENOUS | Status: AC
  Administered 2023-10-02: 1 g via INTRAVENOUS
  Filled 2023-10-02: qty 10

## 2023-10-02 MED ORDER — MIDODRINE HCL 5 MG PO TABS: 10.0000 mg | ORAL_TABLET | Freq: Three times a day (TID) | ORAL | Status: DC

## 2023-10-02 MED ORDER — NOREPINEPHRINE 4 MG/250ML-% IV SOLN
2.0000 ug/min | INTRAVENOUS | Status: DC
  Administered 2023-10-02: 2 ug/min via INTRAVENOUS
  Filled 2023-10-02: qty 250

## 2023-10-02 NOTE — Progress Notes (Signed)
 Patient's tunneled HD cath removed per order. Tip sent for culture.

## 2023-10-02 NOTE — Progress Notes (Signed)
   10/02/23 2050  Vitals  Temp 98.9 F (37.2 C)  Temp Source Oral  BP (!) 141/57  MAP (mmHg) 80  BP Location Right Arm  BP Method Automatic  Patient Position (if appropriate) Lying  Pulse Rate 67  Pulse Rate Source Monitor  ECG Heart Rate 61  Resp (!) 21  Oxygen Therapy  SpO2 100 %  O2 Device Nasal Cannula  O2 Flow Rate (L/min) 3 L/min  Post Treatment  Dialyzer Clearance Lightly streaked  Liters Processed 60  Fluid Removed (mL) 1500 mL  Tolerated HD Treatment Yes  Post-Hemodialysis Comments tolerrated well  Hemodialysis Catheter Right Femoral vein Double lumen Permanent (Tunneled)  Placement Date/Time: 09/24/23 1755   Serial / Lot #: [id number]  Expiration Date: 10/17/27  Time Out: Correct patient;Correct site;Correct procedure  Maximum sterile barrier precautions: Hand hygiene;Large sterile sheet;Sterile probe cover;Cap;Mask;Ste...  Site Condition No complications  Blue Lumen Status Blood return noted;Flushed  Red Lumen Status Blood return noted;Flushed  Purple Lumen Status N/A  Catheter fill solution Other (Comment) (normal saline)  Catheter fill volume (Arterial) 2.6 cc  Catheter fill volume (Venous) 2.6  Dressing Type Gauze/Drain sponge  Dressing Status Antimicrobial disc/dressing in place  Drainage Description None  Post treatment catheter status Capped and Clamped   Received patient in bed to unit.  Alert and oriented.  Informed consent signed and in chart.   TX duration:2.5  Patient tolerated well.   Alert, without acute distress.  Hand-off given to patient's nurse.   Access used: Rt femoral catheter Access issues: none  Total UF removed: 1500 Medication(s) given: none Post HD VS: see above Post HD weight:n/a   [facility]

## 2023-10-02 NOTE — Consult Note (Signed)
 NAME: Amy Wu  DOB: 14-Aug-1950  MRN: [id number]  Date/Time: 10/02/2023 2:38 PM  REQUESTING PROVIDER: Dr.[email or web address] Subjective:  REASON FOR CONSULT: serratia bacteremia ?pt is confused  Amy Wu is a 73 y.o. with a history of ESRD on HD, AKA rt, DM, PAD, rt AKA, left BKA Presents with Altered mental staus, fever and sob on 10/01/23. As per EMS she had been sick since Friday- partner at bed side said the same Pt had missed dialysis one day' Vitals in the ED 114/51 HR 80 Temp 99.8  labs    Latest Reference Range & Units 10/01/23 09:12  WBC 4.0 - 10.5 K/uL 12.3 (H)  Hemoglobin 12.0 - 15.0 g/dL 96.2 (L)  HCT 95.2 - 84.1 % 35.9 (L)  Platelets 150 - 400 K/uL 105 (L)  Creatinine 0.44 - 1.00 mg/dL 3.24 (H)   Blood culture sent and started on broad spectrum antibiotics CXR atelectasis and congestion Pt has a rt femoral HD cath which was exchanged over a guide wire last week on 09/24/23 as it was malfunctioning due to moving out of place  Past Medical History:  Diagnosis Date   Arthritis    Chronic kidney disease    Complication of anesthesia    Diabetes mellitus without complication (HCC)    Hyperlipidemia    Peripheral vascular disease (HCC)     Past Surgical History:  Procedure Laterality Date   A/V SHUNT INTERVENTION Left 11/20/2019   Procedure: A/V SHUNT INTERVENTION (Left Thigh);  Surgeon: Annice Needy, MD;  Location: [facility];  Service: Cardiovascular;  Laterality: Left;   A/V SHUNTOGRAM Left 02/16/2017   Procedure: A/V Shuntogram;  Surgeon: Annice Needy, MD;  Location: [facility];  Service: Cardiovascular;  Laterality: Left;   A/V SHUNTOGRAM Left 12/13/2017   Procedure: A/V SHUNTOGRAM;  Surgeon: Annice Needy, MD;  Location: [facility];  Service: Cardiovascular;  Laterality: Left;   A/V SHUNTOGRAM Left 12/05/2018   Procedure: A/V SHUNTOGRAM;  Surgeon: Annice Needy, MD;  Location: [facility];  Service: Cardiovascular;  Laterality:  Left;   A/V SHUNTOGRAM Left 06/03/2020   Procedure: A/V SHUNTOGRAM;  Surgeon: Annice Needy, MD;  Location: [facility];  Service: Cardiovascular;  Laterality: Left;   A/V SHUNTOGRAM Left 05/24/2021   Procedure: A/V SHUNTOGRAM;  Surgeon: Renford Dills, MD;  Location: [facility];  Service: Cardiovascular;  Laterality: Left;  Thigh Graft   ABOVE KNEE LEG AMPUTATION     AV FISTULA PLACEMENT Bilateral    AV FISTULA PLACEMENT Left 07/01/2015   Procedure: INSERTION OF ARTERIOVENOUS (AV) GRAFT THIGH (ARTEGRAFT);  Surgeon: Annice Needy, MD;  Location: [facility];  Service: Vascular;  Laterality: Left;   BELOW KNEE LEG AMPUTATION Left    BREAST BIOPSY     CATARACT EXTRACTION W/PHACO Left 07/10/2022   Procedure: CATARACT EXTRACTION PHACO AND INTRAOCULAR LENS PLACEMENT (IOC) LEFT DIABETIC  12.98  01:13.7;  Surgeon: Nevada Crane, MD;  Location: [facility];  Service: Ophthalmology;  Laterality: Left;   DIALYSIS/PERMA CATHETER INSERTION N/A 05/26/2021   Procedure: DIALYSIS/PERMA CATHETER INSERTION;  Surgeon: Annice Needy, MD;  Location: [facility];  Service: Cardiovascular;  Laterality: N/A;   DIALYSIS/PERMA CATHETER INSERTION N/A 07/05/2022   Procedure: DIALYSIS/PERMA CATHETER INSERTION;  Surgeon: Annice Needy, MD;  Location: [facility];  Service: Cardiovascular;  Laterality: N/A;   DIALYSIS/PERMA CATHETER INSERTION N/A 07/28/2022   Procedure: DIALYSIS/PERMA CATHETER INSERTION;  Surgeon: Celso College, MD;  Location: [facility];  Service: Cardiovascular;  Laterality: N/A;   DIALYSIS/PERMA CATHETER INSERTION N/A 09/21/2022   Procedure: DIALYSIS/PERMA CATHETER INSERTION;  Surgeon: Celso College, MD;  Location: [facility];  Service: Cardiovascular;  Laterality: N/A;   DIALYSIS/PERMA CATHETER INSERTION N/A 02/15/2023   Procedure: DIALYSIS/PERMA CATHETER INSERTION;  Surgeon: Celso College, MD;  Location: [facility];  Service: Cardiovascular;   Laterality: N/A;   DIALYSIS/PERMA CATHETER INSERTION N/A 04/09/2023   Procedure: DIALYSIS/PERMA CATHETER INSERTION;  Surgeon: Jackquelyn Mass, MD;  Location: [facility];  Service: Cardiovascular;  Laterality: N/A;   DIALYSIS/PERMA CATHETER REPAIR Right 09/24/2023   Procedure: DIALYSIS/PERMA CATHETER REPAIR;  Surgeon: Jackquelyn Mass, MD;  Location: [facility];  Service: Cardiovascular;  Laterality: Right;   ENDARTERECTOMY FEMORAL Left 07/01/2015   Procedure: SUPERFICIAL FEMORAL ENDARTERECTOMY ;  Surgeon: Celso College, MD;  Location: [facility];  Service: Vascular;  Laterality: Left;   EXCHANGE OF A DIALYSIS CATHETER  02/11/2015   Procedure: Exchange Of A Dialysis Catheter;  Surgeon: Celso College, MD;  Location: [facility];  Service: Cardiovascular;;   LEG AMPUTATION ABOVE KNEE Right    PERIPHERAL VASCULAR CATHETERIZATION N/A 02/11/2015   Procedure: Dialysis/Perma Catheter Insertion;  Surgeon: Celso College, MD;  Location: [facility];  Service: Cardiovascular;  Laterality: N/A;   PERIPHERAL VASCULAR CATHETERIZATION N/A 05/11/2015   Procedure: Dialysis/Perma Catheter Insertion, exchange;  Surgeon: Jackquelyn Mass, MD;  Location: [facility];  Service: Cardiovascular;  Laterality: N/A;   PERIPHERAL VASCULAR CATHETERIZATION N/A 05/18/2015   Procedure: Dialysis/Perma Catheter Insertion;  Surgeon: Jackquelyn Mass, MD;  Location: [facility];  Service: Cardiovascular;  Laterality: N/A;   PERIPHERAL VASCULAR CATHETERIZATION Left 07/19/2015   Procedure: A/V Shuntogram/Fistulagram;  Surgeon: Celso College, MD;  Location: [facility];  Service: Cardiovascular;  Laterality: Left;   PERIPHERAL VASCULAR CATHETERIZATION N/A 07/19/2015   Procedure: A/V Shunt Intervention;  Surgeon: Celso College, MD;  Location: [facility];  Service: Cardiovascular;  Laterality: N/A;   PERIPHERAL VASCULAR CATHETERIZATION N/A 08/26/2015   Procedure:  Dialysis/Perma Catheter Removal;  Surgeon: Celso College, MD;  Location: [facility];  Service: Cardiovascular;  Laterality: N/A;   PERIPHERAL VASCULAR THROMBECTOMY N/A 12/29/2016   Procedure: Peripheral Vascular Thrombectomy;  Surgeon: Jackquelyn Mass, MD;  Location: [facility];  Service: Cardiovascular;  Laterality: N/A;    Social History   Socioeconomic History   Marital status: Single    Spouse name: Not on file   Number of children: Not on file   Years of education: Not on file   Highest education level: Not on file  Occupational History   Not on file  Tobacco Use   Smoking status: Never   Smokeless tobacco: Never  Vaping Use   Vaping status: Never Used  Substance and Sexual Activity   Alcohol use: No   Drug use: No   Sexual activity: Not on file  Other Topics Concern   Not on file  Social History Narrative   Lovina Ruddle, friend, lives with and have been together 33 years.   Social Drivers of Corporate investment banker Strain: Not on file  Food Insecurity: No Food Insecurity (10/01/2023)   Hunger Vital Sign    Worried About Running Out of Food in the Last Year: Never true    Ran Out of  Food in the Last Year: Never true  Transportation Needs: No Transportation Needs (10/01/2023)   PRAPARE - Administrator, Civil Service (Medical): No    Lack of Transportation (Non-Medical): No  Physical Activity: Not on file  Stress: Not on file  Social Connections: Moderately Isolated (10/01/2023)   Social Connection and Isolation Panel [NHANES]    Frequency of Communication with Friends and Family: More than three times a week    Frequency of Social Gatherings with Friends and Family: More than three times a week    Attends Religious Services: Never    Database administrator or Organizations: No    Attends Banker Meetings: Never    Marital Status: Living with partner  Intimate Partner Violence: Not At Risk (10/01/2023)    Humiliation, Afraid, Rape, and Kick questionnaire    Fear of Current or Ex-Partner: No    Emotionally Abused: No    Physically Abused: No    Sexually Abused: No    Family History  Problem Relation Age of Onset   Breast cancer Neg Hx    Allergies  Allergen Reactions   Ancef [Cefazolin] Palpitations   Contrast Media [Iodinated Contrast Media] Palpitations   I? Current Facility-Administered Medications  Medication Dose Route Frequency Provider Last Rate Last Admin   0.9 %  sodium chloride infusion  250 mL Intravenous Continuous Gillis Santa, MD       acetaminophen (TYLENOL) tablet 650 mg  650 mg Oral Q6H PRN Floydene Flock, MD   650 mg at 10/01/23 2333   azithromycin (ZITHROMAX) 500 mg in sodium chloride 0.9 % 250 mL IVPB  500 mg Intravenous Daily Orson Aloe, RPH 250 mL/hr at 10/02/23 0845 500 mg at 10/02/23 0845   ceFEPIme (MAXIPIME) 1 g in sodium chloride 0.9 % 100 mL IVPB  1 g Intravenous Q24H BelueLendon Collar, RPH       Chlorhexidine Gluconate Cloth 2 % PADS 6 each  6 each Topical Q0600 Wendee Beavers, NP   6 each at 10/02/23 0513   heparin injection 5,000 Units  5,000 Units Subcutaneous Q8H Floydene Flock, MD   5,000 Units at 10/02/23 0513   insulin aspart (novoLOG) injection 0-9 Units  0-9 Units Subcutaneous TID WC Floydene Flock, MD   1 Units at 10/02/23 1215   norepinephrine (LEVOPHED) 4mg  in (0.016 mg/mL) premix infusion  2-10 mcg/min Intravenous Titrated Gillis Santa, MD       ondansetron [facility]) tablet 4 mg  4 mg Oral Q6H PRN Floydene Flock, MD       Or   ondansetron [facility]) injection 4 mg  4 mg Intravenous Q6H PRN Floydene Flock, MD       tiZANidine (ZANAFLEX) tablet 2 mg  2 mg Oral BID Floydene Flock, MD   2 mg at 10/02/23 0845   Facility-Administered Medications Ordered in Other Encounters  Medication Dose Route Frequency Provider Last Rate Last Admin   Heparin (Porcine) in NaCl 1000-0.9 UT/500ML-% SOLN    PRN Annice Needy, MD   500 mL at  02/15/23 1531   heparin injection    PRN Annice Needy, MD   10,000 Units at 02/15/23 1540   lidocaine-EPINEPHrine (PF) (XYLOCAINE-EPINEPHrine) 1 %-1:200000 (PF) injection    PRN Annice Needy, MD   20 mL at 02/15/23 1530     Abtx:  Anti-infectives (From admission, onward)    Start     Dose/Rate Route Frequency Ordered Stop  10/03/23 1000  levofloxacin (LEVAQUIN) IVPB 500 mg  Status:  Discontinued        500 mg 100 mL/hr over 60 Minutes Intravenous Every 48 hours 10/01/23 1445 10/01/23 1458   10/03/23 1000  cefTRIAXone (ROCEPHIN) 2 g in sodium chloride 0.9 % 100 mL IVPB  Status:  Discontinued        2 g 200 mL/hr over 30 Minutes Intravenous Every 24 hours 10/01/23 1459 10/02/23 0550   10/02/23 2200  ceFEPIme (MAXIPIME) 1 g in sodium chloride 0.9 % 100 mL IVPB       Placed in "Followed by" Linked Group   1 g 200 mL/hr over 30 Minutes Intravenous Every 24 hours 10/02/23 0550     10/02/23 1000  azithromycin (ZITHROMAX) 500 mg in sodium chloride 0.9 % 250 mL IVPB        500 mg 250 mL/hr over 60 Minutes Intravenous Daily 10/01/23 1459 10/07/23 0959   10/02/23 0645  ceFEPIme (MAXIPIME) 1 g in sodium chloride 0.9 % 100 mL IVPB       Placed in "Followed by" Linked Group   1 g 200 mL/hr over 30 Minutes Intravenous  Once 10/02/23 0550 10/02/23 0717   10/01/23 0915  vancomycin (VANCOCIN) IVPB 1000 mg/200 mL premix        1,000 mg 200 mL/hr over 60 Minutes Intravenous  Once 10/01/23 0913 10/01/23 1058   10/01/23 0915  levofloxacin (LEVAQUIN) IVPB 750 mg        750 mg 100 mL/hr over 90 Minutes Intravenous  Once 10/01/23 0913 10/01/23 1807       REVIEW OF SYSTEMS: she has BIPAP machine Unable to get a good ROS  Objective:  VITALS:  BP (!) 113/48   Pulse 67   Temp 100.3 F (37.9 C) (Oral)   Resp 14   Wt 99.9 kg   SpO2 100%   BMI 47.66 kg/m  LDA Rt femoral catheter PHYSICAL EXAM:  General: Awake, some confusion, BIPAP Head: Normocephalic, without obvious abnormality,  atraumatic. Eyes: Conjunctivae clear, anicteric sclerae. Pupils are equal ENT cannot examine Neck: symmetrical, no adenopathy, thyroid: non tender no carotid bruit and no JVD. Back: Did not examine Lungs: b/la ir entry Heart: s1s2 Abdomen: Soft, non-tender,not distended. Bowel sounds normal. No masses Extremities: rt AKA Left BKA Skin: No rashes or lesions. Or bruising Lymph: Cervical, supraclavicular normal. Neurologic: cannot assess Pertinent Labs Lab Results CBC    Component Value Date/Time   WBC 8.1 10/02/2023 0600   RBC 4.30 10/02/2023 0600   HGB 11.2 (L) 10/02/2023 0600   HGB 12.4 12/03/2012 1414   HCT 34.2 (L) 10/02/2023 0600   HCT 38.0 12/03/2012 1414   PLT 108 (L) 10/02/2023 0600   PLT 193 12/03/2012 1414   MCV 79.5 (L) 10/02/2023 0600   MCV 81 12/03/2012 1414   MCH 26.0 10/02/2023 0600   MCHC 32.7 10/02/2023 0600   RDW 17.3 (H) 10/02/2023 0600   RDW 17.7 (H) 12/03/2012 1414   LYMPHSABS 0.7 10/01/2023 0912   MONOABS 0.8 10/01/2023 0912   EOSABS 0.0 10/01/2023 0912   BASOSABS 0.0 10/01/2023 0912       Latest Ref Rng & Units 10/02/2023    6:00 AM 10/01/2023    9:12 AM 09/24/2023    2:20 PM  CMP  Glucose 70 - 99 mg/dL 161  096  045   BUN 8 - 23 mg/dL 23  31  33   Creatinine 0.44 - 1.00 mg/dL 4.09  8.11  9.14  Sodium 135 - 145 mmol/L 133  138  142   Potassium 3.5 - 5.1 mmol/L 3.5  3.0  3.0   Chloride 98 - 111 mmol/L 98  101  105   CO2 22 - 32 mmol/L 24  23  24    Calcium 8.9 - 10.3 mg/dL 8.6  8.6  9.0   Total Protein 6.5 - 8.1 g/dL 6.3  6.7    Total Bilirubin 0.0 - 1.2 mg/dL 1.0  0.9    Alkaline Phos 38 - 126 U/L 68  70    AST 15 - 41 U/L 81  25    ALT 0 - 44 U/L 16  7        Microbiology: Recent Results (from the past 240 hours)  Resp panel by RT-PCR (RSV, Flu A&B, Covid) Anterior Nasal Swab     Status: None   Collection Time: 10/01/23  9:12 AM   Specimen: Anterior Nasal Swab  Result Value Ref Range Status   SARS Coronavirus 2 by RT PCR NEGATIVE  NEGATIVE Final    Comment: (NOTE) SARS-CoV-2 target nucleic acids are NOT DETECTED.  The SARS-CoV-2 RNA is generally detectable in upper respiratory specimens during the acute phase of infection. The lowest concentration of SARS-CoV-2 viral copies this assay can detect is 138 copies/mL. A negative result does not preclude SARS-Cov-2 infection and should not be used as the sole basis for treatment or other patient management decisions. A negative result may occur with  improper specimen collection/handling, submission of specimen other than nasopharyngeal swab, presence of viral mutation(s) within the areas targeted by this assay, and inadequate number of viral copies(<138 copies/mL). A negative result must be combined with clinical observations, patient history, and epidemiological information. The expected result is Negative.  Fact Sheet for Patients:  BloggerCourse.com  Fact Sheet for Healthcare Providers:  SeriousBroker.it  This test is no t yet approved or cleared by the [place] FDA and  has been authorized for detection and/or diagnosis of SARS-CoV-2 by FDA under an Emergency Use Authorization (EUA). This EUA will remain  in effect (meaning this test can be used) for the duration of the COVID-19 declaration under Section 564(b)(1) of the Act, 21 U.S.C.section 360bbb-3(b)(1), unless the authorization is terminated  or revoked sooner.       Influenza A by PCR NEGATIVE NEGATIVE Final   Influenza B by PCR NEGATIVE NEGATIVE Final    Comment: (NOTE) The Xpert Xpress SARS-CoV-2/FLU/RSV plus assay is intended as an aid in the diagnosis of influenza from Nasopharyngeal swab specimens and should not be used as a sole basis for treatment. Nasal washings and aspirates are unacceptable for Xpert Xpress SARS-CoV-2/FLU/RSV testing.  Fact Sheet for Patients: BloggerCourse.com  Fact Sheet for Healthcare  Providers: SeriousBroker.it  This test is not yet approved or cleared by the [place] FDA and has been authorized for detection and/or diagnosis of SARS-CoV-2 by FDA under an Emergency Use Authorization (EUA). This EUA will remain in effect (meaning this test can be used) for the duration of the COVID-19 declaration under Section 564(b)(1) of the Act, 21 U.S.C. section 360bbb-3(b)(1), unless the authorization is terminated or revoked.     Resp Syncytial Virus by PCR NEGATIVE NEGATIVE Final    Comment: (NOTE) Fact Sheet for Patients: BloggerCourse.com  Fact Sheet for Healthcare Providers: SeriousBroker.it  This test is not yet approved or cleared by the [place] FDA and has been authorized for detection and/or diagnosis of SARS-CoV-2 by FDA under an Emergency Use Authorization (EUA).  This EUA will remain in effect (meaning this test can be used) for the duration of the COVID-19 declaration under Section 564(b)(1) of the Act, 21 U.S.C. section 360bbb-3(b)(1), unless the authorization is terminated or revoked.  Performed at [facility], [address], [place], [place] [address]   Blood Culture (routine x 2)     Status: None (Preliminary result)   Collection Time: 10/01/23  9:12 AM   Specimen: BLOOD  Result Value Ref Range Status   Specimen Description   Final    BLOOD BLOOD LEFT FOREARM Performed at [facility], [address] [place], [place] [address]    Special Requests   Final    BOTTLES DRAWN AEROBIC AND ANAEROBIC Blood Culture results may not be optimal due to an inadequate volume of blood received in culture bottles Performed at [facility], [address], [place], [place] [address]    Culture  Setup Time   Final    GRAM NEGATIVE RODS IN BOTH AEROBIC AND ANAEROBIC BOTTLES CRITICAL VALUE NOTED.  VALUE IS CONSISTENT WITH PREVIOUSLY REPORTED AND CALLED  VALUE. Performed at [facility], [address] [place], [place] [address]    Culture GRAM NEGATIVE RODS  Final   Report Status PENDING  Incomplete  Respiratory (~20 pathogens) panel by PCR     Status: None   Collection Time: 10/01/23  9:12 AM   Specimen: Nasopharyngeal Swab; Respiratory  Result Value Ref Range Status   Adenovirus NOT DETECTED NOT DETECTED Final   Coronavirus 229E NOT DETECTED NOT DETECTED Final    Comment: (NOTE) The Coronavirus on the Respiratory Panel, DOES NOT test for the novel  Coronavirus (2019 nCoV)    Coronavirus HKU1 NOT DETECTED NOT DETECTED Final   Coronavirus NL63 NOT DETECTED NOT DETECTED Final   Coronavirus OC43 NOT DETECTED NOT DETECTED Final   Metapneumovirus NOT DETECTED NOT DETECTED Final   Rhinovirus / Enterovirus NOT DETECTED NOT DETECTED Final   Influenza A NOT DETECTED NOT DETECTED Final   Influenza B NOT DETECTED NOT DETECTED Final   Parainfluenza Virus 1 NOT DETECTED NOT DETECTED Final   Parainfluenza Virus 2 NOT DETECTED NOT DETECTED Final   Parainfluenza Virus 3 NOT DETECTED NOT DETECTED Final   Parainfluenza Virus 4 NOT DETECTED NOT DETECTED Final   Respiratory Syncytial Virus NOT DETECTED NOT DETECTED Final   Bordetella pertussis NOT DETECTED NOT DETECTED Final   Bordetella Parapertussis NOT DETECTED NOT DETECTED Final   Chlamydophila pneumoniae NOT DETECTED NOT DETECTED Final   Mycoplasma pneumoniae NOT DETECTED NOT DETECTED Final    Comment: Performed at [facility], [address] [place], [place] [address]  Blood Culture (routine x 2)     Status: None (Preliminary result)   Collection Time: 10/01/23  9:33 AM   Specimen: BLOOD  Result Value Ref Range Status   Specimen Description   Final    BLOOD RIGHT WRIST Performed at [facility], [address], [place], [place] [address]    Special Requests   Final    BOTTLES DRAWN AEROBIC AND ANAEROBIC Blood Culture adequate volume Performed at [facility], [address], [place], [place] [address]    Culture  Setup Time   Final    GRAM NEGATIVE RODS IN BOTH AEROBIC AND ANAEROBIC BOTTLES CRITICAL RESULT CALLED TO, READ BACK BY AND VERIFIED WITHDawayne Cirri AT 2536 10/02/23 JG Performed at [facility], [address] [place], [place] [address]  Culture GRAM NEGATIVE RODS  Final   Report Status PENDING  Incomplete  Blood Culture ID Panel (Reflexed)     Status: Abnormal   Collection Time: 10/01/23  9:33 AM  Result Value Ref Range Status   Enterococcus faecalis NOT DETECTED NOT DETECTED Final   Enterococcus Faecium NOT DETECTED NOT DETECTED Final   Listeria monocytogenes NOT DETECTED NOT DETECTED Final   Staphylococcus species NOT DETECTED NOT DETECTED Final   Staphylococcus aureus (BCID) NOT DETECTED NOT DETECTED Final   Staphylococcus epidermidis NOT DETECTED NOT DETECTED Final   Staphylococcus lugdunensis NOT DETECTED NOT DETECTED Final   Streptococcus species NOT DETECTED NOT DETECTED Final   Streptococcus agalactiae NOT DETECTED NOT DETECTED Final   Streptococcus pneumoniae NOT DETECTED NOT DETECTED Final   Streptococcus pyogenes NOT DETECTED NOT DETECTED Final   A.calcoaceticus-baumannii NOT DETECTED NOT DETECTED Final   Bacteroides fragilis NOT DETECTED NOT DETECTED Final   Enterobacterales DETECTED (A) NOT DETECTED Final    Comment: Enterobacterales represent a large order of gram negative bacteria, not a single organism. CRITICAL RESULT CALLED TO, READ BACK BY AND VERIFIED WITH:  NATHAN BELUE AT 0457 10/02/23 JG    Enterobacter cloacae complex NOT DETECTED NOT DETECTED Final   Escherichia coli NOT DETECTED NOT DETECTED Final   Klebsiella aerogenes NOT DETECTED NOT DETECTED Final   Klebsiella oxytoca NOT DETECTED NOT DETECTED Final   Klebsiella pneumoniae NOT DETECTED NOT DETECTED Final   Proteus species NOT DETECTED NOT DETECTED Final   Salmonella species NOT DETECTED NOT DETECTED Final   Serratia  marcescens DETECTED (A) NOT DETECTED Final    Comment: CRITICAL RESULT CALLED TO, READ BACK BY AND VERIFIED WITH:  NATHAN BELUE AT 0457 10/02/23 JG    Haemophilus influenzae NOT DETECTED NOT DETECTED Final   Neisseria meningitidis NOT DETECTED NOT DETECTED Final   Pseudomonas aeruginosa NOT DETECTED NOT DETECTED Final   Stenotrophomonas maltophilia NOT DETECTED NOT DETECTED Final   Candida albicans NOT DETECTED NOT DETECTED Final   Candida auris NOT DETECTED NOT DETECTED Final   Candida glabrata NOT DETECTED NOT DETECTED Final   Candida krusei NOT DETECTED NOT DETECTED Final   Candida parapsilosis NOT DETECTED NOT DETECTED Final   Candida tropicalis NOT DETECTED NOT DETECTED Final   Cryptococcus neoformans/gattii NOT DETECTED NOT DETECTED Final   CTX-M ESBL NOT DETECTED NOT DETECTED Final   Carbapenem resistance IMP NOT DETECTED NOT DETECTED Final   Carbapenem resistance KPC NOT DETECTED NOT DETECTED Final   Carbapenem resistance NDM NOT DETECTED NOT DETECTED Final   Carbapenem resist OXA 48 LIKE NOT DETECTED NOT DETECTED Final   Carbapenem resistance VIM NOT DETECTED NOT DETECTED Final    Comment: Performed at [facility], [address] [place], [place] [address]    IMAGING RESULTS: CXR   I have personally reviewed the films ?lower lung volumes, pulmonary vascular congestion Left basilar ateectasis  Impression/Recommendation Encephalopathy- likely due to infection- recommend CT  Acute hypoxic resp failure on bipap  Serratia bacteremia likely related to HD catheter which was changed over guidewire last week- need to remove the catheter completely On cefepime - may change to carbapenem if encephalopathy fluctuates  Dc azithromycin  ESRD on HD PAD DM Rt AKA and Left BKA  Anemia Thrombocytopenia  ? ? I have personally spent  75---minutes involved in face-to-face and non-face-to-face activities for this patient on the day of the visit. Professional time  spent includes the following activities: Preparing to see the patient (review of tests), Obtaining and/or  reviewing separately obtained history (admission/discharge record), Performing a medically appropriate examination and/or evaluation , Ordering medications/tests/procedures, referring and communicating with other health care professionals, Documenting clinical information in the EMR, Independently interpreting results (not separately reported), Communicating results to the patient/family/caregiver, Counseling and educating the patient/family/caregiver and Care coordination (not separately reported).    This involved complex antimicrobial management

## 2023-10-02 NOTE — Progress Notes (Signed)
 [facility] Progress Note  Patient: Amy Wu    ZOX:[id number]  DOA: 10/01/2023     Date of Service: the patient was seen and examined on 10/02/2023  Chief Complaint  Patient presents with   Altered Mental Status   Brief hospital course:  Amy Wu is a 73 y.o. female with medical history significant of end-stage renal disease on hemodialysis Monday Wednesday Friday, type 2 diabetes, hyperlipidemia, peripheral vascular disease presenting with cephalopathy, acute respiratory with hypoxia, sepsis, volume overload, missed hemodialysis.  Per report, patient with generalized malaise and fevers over past 24 hours.  Worsening confusion at home per family.  Minimal cough.  Mild shortness of breath.  Positive fatigue.  No abdominal pain.?  Transient diarrhea.  Missed hemodialysis yesterday secondary to symptoms.  No focal hemiparesis.  Worsening confusion and fatigue at home.  Had Tmax at home today around 103.  Non-smoker.  No reported alcohol or illicit drug use. Presented to the ER Tmax 99, heart rate stable.  Blood pressure 70s to 100s.  Satting in the mid 90s on 4 L nasal cannula.  White count 12.3, hemoglobin 11.6, platelets 105, creatinine 7.42, glucose 180, potassium 3.  Lactate within normal limits.  BNP 1436.  Chest x-ray with left basilar atelectasis and vascular congestion.   Assessment and Plan:  # Sepsis, Serratia bacteremia Meeting sepsis criteria with Tmax of 103 prior to arrival, respiration rate in mid 20s, white count 12.2 Lactate within normal limits Defer LR IV fluids in the setting of ESRD and baseline volume overload Noted acute onset respiratory failure requiring O2 as well as lobar pneumonia on chest x-ray S/p IV Levaquin due to penicillin allergy Blood culture growing Serratia, follow sensitivity report 4/15 discussed with ID, recommended to remove dialysis catheter, send tip for the culture Informed to nephrology, catheter will be removed after  hemodialysis. Continue cefepime, pharmacy consulted for dosing Repeat blood cultures when ID recommends.    Acute respiratory failure with hypoxia (HCC) PNA Decompensated respiratory failure requiring 2 L nasal cannula in the setting of sepsis and lobar pneumonia Suspect mild volume overload associated with missed hemodialysis also contributory BNP 1436  S/p IV Levaquin, started cefepime due to Serratia bacteremia Follow blood and respiratory cultures 2D ECHO LVEF >55 percent Continue supplemental oxygen as needed Continue BiPAP    Acute metabolic encephalopathy Positive generalized lethargy on presentation Suspect likely multifactorial contributions of volume overload, hypoxia, pneumonia as well as uremia associated with ESRD Pending hemodialysis Continue supplemental oxygen IV antibiotics for pneumonia coverage Check ammonia level Nonfocal neuroexam Monitor     ESRD on dialysis [facility]) Baseline ESRD on hemodialysis Monday Wednesday Friday Patient missed hemodialysis session Nephro could not continue hemodialysis due to hypotension 4/15 nephro recommended to transfer patient to ICU/stepdown for pressure support so that patient can get hemodialysis done today and then catheter will be removed. Send catheter tip for culture and follow report Patient will need dialysis access when blood cultures will be negative     Hyperlipidemia, unspecified Cont statin    Type 2 diabetes mellitus (HCC) Blood sugar 180s SSI  A1C  Monitor    Body mass index is 47.66 kg/m.  Interventions:  Diet: Keep n.p.o. for now until patient is on BiPAP and encephalopathic Resume diet when patient is alert: Renal/carb modified diet  DVT Prophylaxis: Subcutaneous Heparin    Advance goals of care discussion: DNR-limited  Family Communication: family was present at bedside, at the time of interview.  Patient had significant encephalopathy, AAO  x 1 Patient's significant other was at bedside,  management plan discussed.   Disposition:  Pt is from SNF, admitted with sepsis, still has bacteremia, on IV antibiotics, which precludes a safe discharge. Discharge to SNF, when stable, may need 5 to 7 days to improve.  Subjective: No significant events overnight, patient was admitted last night due to metabolic encephalopathy due to sepsis.  Patient was having significant sleep apnea, placed on BiPAP.  Patient was AO x 1, unable to offer any complaints.   Physical Exam: General: Somnolent, dozing off due to sleep apnea Eyes: Briefly opened, dozing off ENT: Oral Mucosa Clear, dry Neck: no JVD,  Cardiovascular: S1 and S2 Present, no Murmur,  Respiratory: good respiratory effort, Bilateral Air entry equal and Decreased, no Crackles, no wheezes Abdomen: Bowel Sound present, Soft and no tenderness,  Skin: no rashes Extremities: s/p Righ AKA and s/p Left BKA  Neurologic: AAOx 1, encephalopathic, no focal deficits Bedbound at baseline  Vitals:   10/01/23 2216 10/02/23 0511 10/02/23 0700 10/02/23 1156  BP: (!) 98/43 (!) 136/53 (!) 154/64 (!) 113/48  Pulse: 82 71 79 67  Resp: 18 14    Temp: 100 F (37.8 C) 99.5 F (37.5 C) (!) 102.4 F (39.1 C) 100.3 F (37.9 C)  TempSrc: Oral Oral Oral Oral  SpO2: 93% 94%  100%  Weight:  99.9 kg      Intake/Output Summary (Last 24 hours) at 10/02/2023 1550 Last data filed at 10/02/2023 1034 Gross per 24 hour  Intake 0 ml  Output 1 ml  Net -1 ml   Filed Weights   10/01/23 1211 10/01/23 1633 10/02/23 0511  Weight: 104.3 kg 104.4 kg 99.9 kg    Data Reviewed: I have personally reviewed and interpreted daily labs, tele strips, imagings as discussed above. I reviewed all nursing notes, pharmacy notes, vitals, pertinent old records I have discussed plan of care as described above with RN and patient/family.  CBC: Recent Labs  Lab 10/01/23 0912 10/02/23 0600  WBC 12.3* 8.1  NEUTROABS 10.7*  --   HGB 11.6* 11.2*  HCT 35.9* 34.2*  MCV  81.2 79.5*  PLT 105* 108*   Basic Metabolic Panel: Recent Labs  Lab 10/01/23 0912 10/02/23 0600  NA 138 133*  K 3.0* 3.5  CL 101 98  CO2 23 24  GLUCOSE 180* 147*  BUN 31* 23  CREATININE 7.42* 5.13*  CALCIUM 8.6* 8.6*    Studies: ECHOCARDIOGRAM COMPLETE Result Date: 10/02/2023    ECHOCARDIOGRAM REPORT   Patient Name:   Amy Wu Date of Exam: 10/02/2023 Medical Rec #:  [id number]     Height:       57.0 in Accession #:    [id number]    Weight:       220.2 lb Date of Birth:  25-May-1951    BSA:          1.874 m Patient Age:    72 years      BP:           154/64 mmHg Patient Gender: F             HR:           89 bpm. Exam Location:  [facility] Procedure: 2D Echo, Cardiac Doppler, Color Doppler and Intracardiac            Opacification Agent (Both Spectral and Color Flow Doppler were            utilized during procedure). Indications:  Cardiomegaly  History:         Patient has no prior history of Echocardiogram examinations.                  Cardiomegaly; Risk Factors:Hypertension, Diabetes and                  Dyslipidemia. ESRD on Dialysis.  Sonographer:     Clarke Crouch Referring Phys:  [phone] Alayne Allis NEWTON Diagnosing Phys: Sammy Crisp MD  Sonographer Comments: Technically difficult study due to poor echo windows, suboptimal apical window and patient is obese. Image acquisition challenging due to respiratory motion. IMPRESSIONS  1. Left ventricular ejection fraction, by estimation, is >55%. The left ventricle has normal function. Left ventricular endocardial border not optimally defined to evaluate regional wall motion. There is mild left ventricular hypertrophy. Left ventricular diastolic parameters are indeterminate.  2. Right ventricular systolic function is low normal. The right ventricular size is normal.  3. The mitral valve is normal in structure. No evidence of mitral valve regurgitation. No evidence of mitral stenosis.  4. The aortic valve is tricuspid. There is moderate  thickening of the aortic valve. Aortic valve regurgitation is not visualized. Aortic valve sclerosis is present, with no evidence of aortic valve stenosis. FINDINGS  Left Ventricle: Left ventricular ejection fraction, by estimation, is >55%. The left ventricle has normal function. Left ventricular endocardial border not optimally defined to evaluate regional wall motion. Definity contrast agent was given IV to delineate the left ventricular endocardial borders. The left ventricular internal cavity size was normal in size. There is mild left ventricular hypertrophy. Left ventricular diastolic parameters are indeterminate. Right Ventricle: The right ventricular size is normal. Right vetricular wall thickness was not well visualized. Right ventricular systolic function is low normal. Left Atrium: Left atrial size was normal in size. Right Atrium: Right atrial size was not well visualized. Pericardium: The pericardium was not well visualized. Mitral Valve: The mitral valve is normal in structure. Mild mitral annular calcification. No evidence of mitral valve regurgitation. No evidence of mitral valve stenosis. MV peak gradient, 6.8 mmHg. The mean mitral valve gradient is 3.0 mmHg. Tricuspid Valve: The tricuspid valve is normal in structure. Tricuspid valve regurgitation is mild. Aortic Valve: The aortic valve is tricuspid. There is moderate thickening of the aortic valve. Aortic valve regurgitation is not visualized. Aortic valve sclerosis is present, with no evidence of aortic valve stenosis. Aortic valve mean gradient measures  5.0 mmHg. Aortic valve peak gradient measures 11.0 mmHg. Aortic valve area, by VTI measures 2.65 cm. Pulmonic Valve: The pulmonic valve was grossly normal. Pulmonic valve regurgitation is trivial. No evidence of pulmonic stenosis. Aorta: The aortic root is normal in size and structure. Pulmonary Artery: The pulmonary artery is of normal size. Venous: The inferior vena cava was not well  visualized. IAS/Shunts: The interatrial septum was not well visualized.  LEFT VENTRICLE PLAX 2D LVIDd:         3.80 cm   Diastology LVIDs:         2.50 cm   LV e' medial:    8.60 cm/s LV PW:         1.15 cm   LV E/e' medial:  12.3 LV IVS:        1.06 cm   LV e' lateral:   7.22 cm/s LVOT diam:     2.10 cm   LV E/e' lateral: 14.7 LV SV:         79 LV SV Index:  42 LVOT Area:     3.46 cm  LEFT ATRIUM           Index LA diam:      3.60 cm 1.92 cm/m LA Vol (A4C): 60.2 ml 32.13 ml/m  AORTIC VALVE                     PULMONIC VALVE AV Area (Vmax):    2.94 cm      PV Vmax:       0.94 m/s AV Area (Vmean):   2.62 cm      PV Peak grad:  3.5 mmHg AV Area (VTI):     2.65 cm AV Vmax:           166.00 cm/s AV Vmean:          107.000 cm/s AV VTI:            0.296 m AV Peak Grad:      11.0 mmHg AV Mean Grad:      5.0 mmHg LVOT Vmax:         141.00 cm/s LVOT Vmean:        81.050 cm/s LVOT VTI:          0.227 m LVOT/AV VTI ratio: 0.77  AORTA Ao Root diam: 3.30 cm MITRAL VALVE                TRICUSPID VALVE MV Area (PHT): 2.71 cm     TR Peak grad:   34.3 mmHg MV Area VTI:   2.34 cm     TR Vmax:        293.00 cm/s MV Peak grad:  6.8 mmHg MV Mean grad:  3.0 mmHg     SHUNTS MV Vmax:       1.30 m/s     Systemic VTI:  0.23 m MV Vmean:      82.3 cm/s    Systemic Diam: 2.10 cm MV Decel Time: 280 msec MV E velocity: 106.00 cm/s MV A velocity: 114.00 cm/s MV E/A ratio:  0.93 Christopher End MD Electronically signed by Sammy Crisp MD Signature Date/Time: 10/02/2023/1:03:27 PM    Final     Scheduled Meds:  Chlorhexidine Gluconate Cloth  6 each Topical Q0600   heparin injection (subcutaneous)  5,000 Units Subcutaneous Q8H   insulin aspart  0-9 Units Subcutaneous TID WC   tiZANidine  2 mg Oral BID   Continuous Infusions:  sodium chloride     azithromycin 500 mg (10/02/23 0845)   ceFEPime (MAXIPIME) IV     norepinephrine (LEVOPHED) Adult infusion     PRN Meds: acetaminophen, ondansetron **OR** ondansetron (ZOFRAN)  IV  Time spent: 55 minutes  Author: Althia Atlas. MD [facility] 10/02/2023 3:50 PM  To reach On-call, see care teams to locate the attending and reach out to them via [email or web address]. If 7PM-7AM, please contact night-coverage If you still have difficulty reaching the attending provider, please page the [facility] (Director on Call) for [facility] on amion for assistance.

## 2023-10-02 NOTE — Plan of Care (Signed)
       CROSS COVER NOTE  NAME: Amy Wu MRN: [id number] DOB : 1951/03/05    Concern as stated by pharmacy   BCID results for this pt: 3 of 4 bottles with Serratia marcescens, no resistance detected. Pt initially ordered Levaquin but is currently on Azithromycin and Ceftriaxone for CAP. Okay with transitioning pt from Ceftriaxone to Cefepime?      Pertinent findings on chart review:   Assessment and  Interventions   Assessment:  Serratia on BCID  Plan: Switch ceftriaxone to cefepime per pharmacy recommendations X X

## 2023-10-02 NOTE — Progress Notes (Signed)
*  PRELIMINARY RESULTS* Echocardiogram 2D Echocardiogram has been performed.  Silvana Drones 10/02/2023, 10:22 AM

## 2023-10-02 NOTE — Plan of Care (Signed)
 Problem: Education: Goal: Knowledge of General Education information will improve Description: Including pain rating scale, medication(s)/side effects and non-pharmacologic comfort measures 10/02/2023 1926 by Murriel Hopper, Donald Pore, RN Outcome: Progressing 10/02/2023 1926 by Murriel Hopper, Donald Pore, RN Outcome: Progressing   Problem: Health Behavior/Discharge Planning: Goal: Ability to manage health-related needs will improve 10/02/2023 1926 by Monica Becton, RN Outcome: Progressing 10/02/2023 1926 by Murriel Hopper, Donald Pore, RN Outcome: Progressing   Problem: Clinical Measurements: Goal: Ability to maintain clinical measurements within normal limits will improve 10/02/2023 1926 by Monica Becton, RN Outcome: Progressing 10/02/2023 1926 by Murriel Hopper, Donald Pore, RN Outcome: Progressing Goal: Will remain free from infection 10/02/2023 1926 by Monica Becton, RN Outcome: Progressing 10/02/2023 1926 by Murriel Hopper, Donald Pore, RN Outcome: Progressing Goal: Diagnostic test results will improve 10/02/2023 1926 by Monica Becton, RN Outcome: Progressing 10/02/2023 1926 by Murriel Hopper, Donald Pore, RN Outcome: Progressing Goal: Respiratory complications will improve 10/02/2023 1926 by Monica Becton, RN Outcome: Progressing 10/02/2023 1926 by Murriel Hopper, Donald Pore, RN Outcome: Progressing Goal: Cardiovascular complication will be avoided 10/02/2023 1926 by Monica Becton, RN Outcome: Progressing 10/02/2023 1926 by Monica Becton, RN Outcome: Progressing   Problem: Activity: Goal: Risk for activity intolerance will decrease 10/02/2023 1926 by Murriel Hopper, Donald Pore, RN Outcome: Progressing 10/02/2023 1926 by Murriel Hopper, Donald Pore, RN Outcome: Progressing   Problem: Nutrition: Goal: Adequate nutrition will be maintained 10/02/2023 1926 by Murriel Hopper, Donald Pore, RN Outcome: Progressing 10/02/2023 1926 by Murriel Hopper, Donald Pore, RN Outcome: Progressing   Problem: Coping: Goal: Level of anxiety will decrease 10/02/2023 1926 by Murriel Hopper, Donald Pore, RN Outcome: Progressing 10/02/2023 1926 by Murriel Hopper, Donald Pore, RN Outcome: Progressing   Problem: Elimination: Goal: Will not experience complications related to bowel motility 10/02/2023 1926 by Monica Becton, RN Outcome: Progressing 10/02/2023 1926 by Murriel Hopper, Donald Pore, RN Outcome: Progressing Goal: Will not experience complications related to urinary retention 10/02/2023 1926 by Monica Becton, RN Outcome: Progressing 10/02/2023 1926 by Murriel Hopper, Donald Pore, RN Outcome: Progressing   Problem: Pain Managment: Goal: General experience of comfort will improve and/or be controlled 10/02/2023 1926 by Monica Becton, RN Outcome: Progressing 10/02/2023 1926 by Murriel Hopper, Donald Pore, RN Outcome: Progressing   Problem: Safety: Goal: Ability to remain free from injury will improve 10/02/2023 1926 by Monica Becton, RN Outcome: Progressing 10/02/2023 1926 by Murriel Hopper, Donald Pore, RN Outcome: Progressing   Problem: Skin Integrity: Goal: Risk for impaired skin integrity will decrease 10/02/2023 1926 by Monica Becton, RN Outcome: Progressing 10/02/2023 1926 by Murriel Hopper, Donald Pore, RN Outcome: Progressing   Problem: Fluid Volume: Goal: Hemodynamic stability will improve 10/02/2023 1926 by Monica Becton, RN Outcome: Progressing 10/02/2023 1926 by Murriel Hopper, Donald Pore, RN Outcome: Progressing   Problem: Clinical Measurements: Goal: Diagnostic test results will improve 10/02/2023 1926 by Monica Becton, RN Outcome: Progressing 10/02/2023 1926 by Murriel Hopper, Donald Pore, RN Outcome: Progressing Goal: Signs and symptoms of infection will decrease 10/02/2023 1926 by Monica Becton, RN Outcome: Progressing 10/02/2023 1926 by Murriel Hopper, Donald Pore,  RN Outcome: Progressing   Problem: Respiratory: Goal: Ability to maintain adequate ventilation will improve 10/02/2023 1926 by Monica Becton, RN Outcome: Progressing 10/02/2023 1926 by Murriel Hopper, Donald Pore, RN Outcome: Progressing   Problem: Education: Goal: Ability to describe self-care measures that may prevent or decrease complications (Diabetes Survival Skills Education) will improve 10/02/2023 1926 by Monica Becton, RN Outcome: Progressing 10/02/2023 1926 by Monica Becton, RN Outcome: Progressing Goal: Individualized Educational Video(s) 10/02/2023 1926 by  Jeneen Mire, Billie Budge, RN Outcome: Progressing 10/02/2023 1926 by Jeneen Mire, Billie Budge, RN Outcome: Progressing   Problem: Coping: Goal: Ability to adjust to condition or change in health will improve 10/02/2023 1926 by Jeneen Mire, Billie Budge, RN Outcome: Progressing 10/02/2023 1926 by Jeneen Mire, Billie Budge, RN Outcome: Progressing   Problem: Fluid Volume: Goal: Ability to maintain a balanced intake and output will improve 10/02/2023 1926 by Nereida Banning, RN Outcome: Progressing 10/02/2023 1926 by Jeneen Mire, Billie Budge, RN Outcome: Progressing   Problem: Health Behavior/Discharge Planning: Goal: Ability to identify and utilize available resources and services will improve 10/02/2023 1926 by Jeneen Mire, Billie Budge, RN Outcome: Progressing 10/02/2023 1926 by Jeneen Mire, Billie Budge, RN Outcome: Progressing Goal: Ability to manage health-related needs will improve 10/02/2023 1926 by Nereida Banning, RN Outcome: Progressing 10/02/2023 1926 by Jeneen Mire, Billie Budge, RN Outcome: Progressing   Problem: Metabolic: Goal: Ability to maintain appropriate glucose levels will improve 10/02/2023 1926 by Nereida Banning, RN Outcome: Progressing 10/02/2023 1926 by Jeneen Mire, Billie Budge, RN Outcome: Progressing   Problem: Nutritional: Goal: Maintenance of  adequate nutrition will improve 10/02/2023 1926 by Nereida Banning, RN Outcome: Progressing 10/02/2023 1926 by Jeneen Mire, Billie Budge, RN Outcome: Progressing Goal: Progress toward achieving an optimal weight will improve 10/02/2023 1926 by Nereida Banning, RN Outcome: Progressing 10/02/2023 1926 by Jeneen Mire, Billie Budge, RN Outcome: Progressing   Problem: Skin Integrity: Goal: Risk for impaired skin integrity will decrease 10/02/2023 1926 by Nereida Banning, RN Outcome: Progressing 10/02/2023 1926 by Jeneen Mire, Billie Budge, RN Outcome: Progressing   Problem: Tissue Perfusion: Goal: Adequacy of tissue perfusion will improve 10/02/2023 1926 by Nereida Banning, RN Outcome: Progressing 10/02/2023 1926 by Nereida Banning, RN Outcome: Progressing

## 2023-10-02 NOTE — Progress Notes (Addendum)
 [facility]  ROUNDING NOTE   Subjective:   Amy Wu  is a 73 y.o.  female with past medical history of hypertension, diabetes, PVD, dyslipidemia, and end-stage renal disease on dialysis. She presents to the ED via EMS for evaluation of altered mental status and fever and has been admitted under observation for Acute respiratory failure with hypoxia (HCC) [J96.01] Sepsis (HCC) [A41.9] Sepsis, due to unspecified organism, unspecified whether acute organ dysfunction present [facility]) [A41.9]  Patient is known to our practice and receives outpatient dialysis treatments at [facility] on a MWF schedule, supervised by [facility] physicians.   Patient seen sitting up in bed No family present ECHO at bedside Alert [place] 6L, febrile Denies chest pain  Patient seen later with attending on Bipap, somnolent    Objective:  Vital signs in last 24 hours:  Temp:  [98.3 F (36.8 C)-102.4 F (39.1 C)] 100.3 F (37.9 C) (04/15 1156) Pulse Rate:  [60-82] 67 (04/15 1156) Resp:  [14-22] 14 (04/15 0511) BP: (74-154)/(23-64) 113/48 (04/15 1156) SpO2:  [93 %-100 %] 100 % (04/15 1156) FiO2 (%):  [40 %] 40 % (04/15 1154) Weight:  [99.9 kg-104.4 kg] 99.9 kg (04/15 0511)  Weight change:  Filed Weights   10/01/23 1211 10/01/23 1633 10/02/23 0511  Weight: 104.3 kg 104.4 kg 99.9 kg    Intake/Output: I/O last 3 completed shifts: In: -  Out: 1 [Stool:1]   Intake/Output this shift:  No intake/output data recorded.  Physical Exam: General: NAD  Head: Normocephalic, atraumatic.   Eyes: Anicteric  Lungs:  Crackles, bipap  Heart: Regular rate and rhythm  Abdomen:  Soft, nontender  Extremities:  No peripheral edema. Bilat AKA  Neurologic: Alert, moving all four extremities  Skin: No lesions  Access: Rt thigh permcath    Basic Metabolic Panel: Recent Labs  Lab 10/01/23 0912 10/02/23 0600  NA 138 133*  K 3.0* 3.5  CL 101 98  CO2 23 24  GLUCOSE 180* 147*  BUN 31* 23   CREATININE 7.42* 5.13*  CALCIUM 8.6* 8.6*    Liver Function Tests: Recent Labs  Lab 10/01/23 0912 10/02/23 0600  AST 25 81*  ALT 7 16  ALKPHOS 70 68  BILITOT 0.9 1.0  PROT 6.7 6.3*  ALBUMIN 3.1* 2.9*   No results for input(s): "LIPASE", "AMYLASE" in the last 168 hours. Recent Labs  Lab 10/01/23 2221  AMMONIA 12    CBC: Recent Labs  Lab 10/01/23 0912 10/02/23 0600  WBC 12.3* 8.1  NEUTROABS 10.7*  --   HGB 11.6* 11.2*  HCT 35.9* 34.2*  MCV 81.2 79.5*  PLT 105* 108*    Cardiac Enzymes: No results for input(s): "CKTOTAL", "CKMB", "CKMBINDEX", "TROPONINI" in the last 168 hours.  BNP: Invalid input(s): "POCBNP"  CBG: Recent Labs  Lab 10/01/23 1810 10/01/23 2220 10/02/23 0826 10/02/23 1210  GLUCAP 150* 167* 139* 139*    Microbiology: Results for orders placed or performed during the hospital encounter of 10/01/23  Resp panel by RT-PCR (RSV, Flu A&B, Covid) Anterior Nasal Swab     Status: None   Collection Time: 10/01/23  9:12 AM   Specimen: Anterior Nasal Swab  Result Value Ref Range Status   SARS Coronavirus 2 by RT PCR NEGATIVE NEGATIVE Final    Comment: (NOTE) SARS-CoV-2 target nucleic acids are NOT DETECTED.  The SARS-CoV-2 RNA is generally detectable in upper respiratory specimens during the acute phase of infection. The lowest concentration of SARS-CoV-2 viral copies this assay can detect  is 138 copies/mL. A negative result does not preclude SARS-Cov-2 infection and should not be used as the sole basis for treatment or other patient management decisions. A negative result may occur with  improper specimen collection/handling, submission of specimen other than nasopharyngeal swab, presence of viral mutation(s) within the areas targeted by this assay, and inadequate number of viral copies(<138 copies/mL). A negative result must be combined with clinical observations, patient history, and epidemiological information. The expected result is  Negative.  Fact Sheet for Patients:  BloggerCourse.com  Fact Sheet for Healthcare Providers:  SeriousBroker.it  This test is no t yet approved or cleared by the [place] FDA and  has been authorized for detection and/or diagnosis of SARS-CoV-2 by FDA under an Emergency Use Authorization (EUA). This EUA will remain  in effect (meaning this test can be used) for the duration of the COVID-19 declaration under Section 564(b)(1) of the Act, 21 U.S.C.section 360bbb-3(b)(1), unless the authorization is terminated  or revoked sooner.       Influenza A by PCR NEGATIVE NEGATIVE Final   Influenza B by PCR NEGATIVE NEGATIVE Final    Comment: (NOTE) The Xpert Xpress SARS-CoV-2/FLU/RSV plus assay is intended as an aid in the diagnosis of influenza from Nasopharyngeal swab specimens and should not be used as a sole basis for treatment. Nasal washings and aspirates are unacceptable for Xpert Xpress SARS-CoV-2/FLU/RSV testing.  Fact Sheet for Patients: BloggerCourse.com  Fact Sheet for Healthcare Providers: SeriousBroker.it  This test is not yet approved or cleared by the [place] FDA and has been authorized for detection and/or diagnosis of SARS-CoV-2 by FDA under an Emergency Use Authorization (EUA). This EUA will remain in effect (meaning this test can be used) for the duration of the COVID-19 declaration under Section 564(b)(1) of the Act, 21 U.S.C. section 360bbb-3(b)(1), unless the authorization is terminated or revoked.     Resp Syncytial Virus by PCR NEGATIVE NEGATIVE Final    Comment: (NOTE) Fact Sheet for Patients: BloggerCourse.com  Fact Sheet for Healthcare Providers: SeriousBroker.it  This test is not yet approved or cleared by the [place] FDA and has been authorized for detection and/or diagnosis of  SARS-CoV-2 by FDA under an Emergency Use Authorization (EUA). This EUA will remain in effect (meaning this test can be used) for the duration of the COVID-19 declaration under Section 564(b)(1) of the Act, 21 U.S.C. section 360bbb-3(b)(1), unless the authorization is terminated or revoked.  Performed at [facility], [address], [place], [place] [address]   Blood Culture (routine x 2)     Status: None (Preliminary result)   Collection Time: 10/01/23  9:12 AM   Specimen: BLOOD  Result Value Ref Range Status   Specimen Description   Final    BLOOD BLOOD LEFT FOREARM Performed at [facility], [address], [place], [place] [address]    Special Requests   Final    BOTTLES DRAWN AEROBIC AND ANAEROBIC Blood Culture results may not be optimal due to an inadequate volume of blood received in culture bottles Performed at [facility], [address] [place], [place] [address]    Culture  Setup Time   Final    GRAM NEGATIVE RODS IN BOTH AEROBIC AND ANAEROBIC BOTTLES CRITICAL VALUE NOTED.  VALUE IS CONSISTENT WITH PREVIOUSLY REPORTED AND CALLED VALUE. Performed at [facility], [address], [place], [place] [address]    Culture GRAM NEGATIVE RODS  Final   Report Status PENDING  Incomplete  Respiratory (~  20 pathogens) panel by PCR     Status: None   Collection Time: 10/01/23  9:12 AM   Specimen: Nasopharyngeal Swab; Respiratory  Result Value Ref Range Status   Adenovirus NOT DETECTED NOT DETECTED Final   Coronavirus 229E NOT DETECTED NOT DETECTED Final    Comment: (NOTE) The Coronavirus on the Respiratory Panel, DOES NOT test for the novel  Coronavirus (2019 nCoV)    Coronavirus HKU1 NOT DETECTED NOT DETECTED Final   Coronavirus NL63 NOT DETECTED NOT DETECTED Final   Coronavirus OC43 NOT DETECTED NOT DETECTED Final   Metapneumovirus NOT DETECTED NOT DETECTED Final   Rhinovirus / Enterovirus NOT DETECTED NOT DETECTED Final   Influenza  A NOT DETECTED NOT DETECTED Final   Influenza B NOT DETECTED NOT DETECTED Final   Parainfluenza Virus 1 NOT DETECTED NOT DETECTED Final   Parainfluenza Virus 2 NOT DETECTED NOT DETECTED Final   Parainfluenza Virus 3 NOT DETECTED NOT DETECTED Final   Parainfluenza Virus 4 NOT DETECTED NOT DETECTED Final   Respiratory Syncytial Virus NOT DETECTED NOT DETECTED Final   Bordetella pertussis NOT DETECTED NOT DETECTED Final   Bordetella Parapertussis NOT DETECTED NOT DETECTED Final   Chlamydophila pneumoniae NOT DETECTED NOT DETECTED Final   Mycoplasma pneumoniae NOT DETECTED NOT DETECTED Final    Comment: Performed at [facility], [address], [place], [place] [address]  Blood Culture (routine x 2)     Status: None (Preliminary result)   Collection Time: 10/01/23  9:33 AM   Specimen: BLOOD  Result Value Ref Range Status   Specimen Description   Final    BLOOD RIGHT WRIST Performed at [facility], [address], [place], [place] [address]    Special Requests   Final    BOTTLES DRAWN AEROBIC AND ANAEROBIC Blood Culture adequate volume Performed at [facility], [address], [place], [place] [address]    Culture  Setup Time   Final    GRAM NEGATIVE RODS IN BOTH AEROBIC AND ANAEROBIC BOTTLES CRITICAL RESULT CALLED TO, READ BACK BY AND VERIFIED WITH:  NATHAN BELUE AT 0457 10/02/23 JG Performed at [facility], [address], [place], [place] [address]    Culture GRAM NEGATIVE RODS  Final   Report Status PENDING  Incomplete  Blood Culture ID Panel (Reflexed)     Status: Abnormal   Collection Time: 10/01/23  9:33 AM  Result Value Ref Range Status   Enterococcus faecalis NOT DETECTED NOT DETECTED Final   Enterococcus Faecium NOT DETECTED NOT DETECTED Final   Listeria monocytogenes NOT DETECTED NOT DETECTED Final   Staphylococcus species NOT DETECTED NOT DETECTED Final   Staphylococcus aureus (BCID) NOT DETECTED NOT DETECTED Final   Staphylococcus  epidermidis NOT DETECTED NOT DETECTED Final   Staphylococcus lugdunensis NOT DETECTED NOT DETECTED Final   Streptococcus species NOT DETECTED NOT DETECTED Final   Streptococcus agalactiae NOT DETECTED NOT DETECTED Final   Streptococcus pneumoniae NOT DETECTED NOT DETECTED Final   Streptococcus pyogenes NOT DETECTED NOT DETECTED Final   A.calcoaceticus-baumannii NOT DETECTED NOT DETECTED Final   Bacteroides fragilis NOT DETECTED NOT DETECTED Final   Enterobacterales DETECTED (A) NOT DETECTED Final    Comment: Enterobacterales represent a large order of gram negative bacteria, not a single organism. CRITICAL RESULT CALLED TO, READ BACK BY AND VERIFIED WITH:  NATHAN BELUE AT 0457 10/02/23 JG    Enterobacter cloacae complex NOT DETECTED NOT DETECTED Final   Escherichia coli NOT DETECTED NOT DETECTED Final  Klebsiella aerogenes NOT DETECTED NOT DETECTED Final   Klebsiella oxytoca NOT DETECTED NOT DETECTED Final   Klebsiella pneumoniae NOT DETECTED NOT DETECTED Final   Proteus species NOT DETECTED NOT DETECTED Final   Salmonella species NOT DETECTED NOT DETECTED Final   Serratia marcescens DETECTED (A) NOT DETECTED Final    Comment: CRITICAL RESULT CALLED TO, READ BACK BY AND VERIFIED WITH:  NATHAN BELUE AT 0457 10/02/23 JG    Haemophilus influenzae NOT DETECTED NOT DETECTED Final   Neisseria meningitidis NOT DETECTED NOT DETECTED Final   Pseudomonas aeruginosa NOT DETECTED NOT DETECTED Final   Stenotrophomonas maltophilia NOT DETECTED NOT DETECTED Final   Candida albicans NOT DETECTED NOT DETECTED Final   Candida auris NOT DETECTED NOT DETECTED Final   Candida glabrata NOT DETECTED NOT DETECTED Final   Candida krusei NOT DETECTED NOT DETECTED Final   Candida parapsilosis NOT DETECTED NOT DETECTED Final   Candida tropicalis NOT DETECTED NOT DETECTED Final   Cryptococcus neoformans/gattii NOT DETECTED NOT DETECTED Final   CTX-M ESBL NOT DETECTED NOT DETECTED Final   Carbapenem resistance  IMP NOT DETECTED NOT DETECTED Final   Carbapenem resistance KPC NOT DETECTED NOT DETECTED Final   Carbapenem resistance NDM NOT DETECTED NOT DETECTED Final   Carbapenem resist OXA 48 LIKE NOT DETECTED NOT DETECTED Final   Carbapenem resistance VIM NOT DETECTED NOT DETECTED Final    Comment: Performed at [facility], [address] [place], [place] [address]    Coagulation Studies: Recent Labs    10/01/23 0912  LABPROT 17.4*  INR 1.4*    Urinalysis: No results for input(s): "COLORURINE", "LABSPEC", "PHURINE", "GLUCOSEU", "HGBUR", "BILIRUBINUR", "KETONESUR", "PROTEINUR", "UROBILINOGEN", "NITRITE", "LEUKOCYTESUR" in the last 72 hours.  Invalid input(s): "APPERANCEUR"    Imaging: ECHOCARDIOGRAM COMPLETE Result Date: 10/02/2023    ECHOCARDIOGRAM REPORT   Patient Name:   Amy Wu Date of Exam: 10/02/2023 Medical Rec #:  [id number]     Height:       57.0 in Accession #:    [id number]    Weight:       220.2 lb Date of Birth:  Aug 07, 1950    BSA:          1.874 m Patient Age:    72 years      BP:           154/64 mmHg Patient Gender: F             HR:           89 bpm. Exam Location:  [facility] Procedure: 2D Echo, Cardiac Doppler, Color Doppler and Intracardiac            Opacification Agent (Both Spectral and Color Flow Doppler were            utilized during procedure). Indications:     Cardiomegaly  History:         Patient has no prior history of Echocardiogram examinations.                  Cardiomegaly; Risk Factors:Hypertension, Diabetes and                  Dyslipidemia. ESRD on Dialysis.  Sonographer:     Clarke Crouch Referring Phys:  [phone] Alayne Allis NEWTON Diagnosing Phys: Sammy Crisp MD  Sonographer Comments: Technically difficult study due to poor echo windows, suboptimal apical window and patient is obese. Image acquisition challenging due to respiratory motion. IMPRESSIONS  1. Left ventricular ejection fraction, by  estimation, is >55%. The left ventricle has normal  function. Left ventricular endocardial border not optimally defined to evaluate regional wall motion. There is mild left ventricular hypertrophy. Left ventricular diastolic parameters are indeterminate.  2. Right ventricular systolic function is low normal. The right ventricular size is normal.  3. The mitral valve is normal in structure. No evidence of mitral valve regurgitation. No evidence of mitral stenosis.  4. The aortic valve is tricuspid. There is moderate thickening of the aortic valve. Aortic valve regurgitation is not visualized. Aortic valve sclerosis is present, with no evidence of aortic valve stenosis. FINDINGS  Left Ventricle: Left ventricular ejection fraction, by estimation, is >55%. The left ventricle has normal function. Left ventricular endocardial border not optimally defined to evaluate regional wall motion. Definity contrast agent was given IV to delineate the left ventricular endocardial borders. The left ventricular internal cavity size was normal in size. There is mild left ventricular hypertrophy. Left ventricular diastolic parameters are indeterminate. Right Ventricle: The right ventricular size is normal. Right vetricular wall thickness was not well visualized. Right ventricular systolic function is low normal. Left Atrium: Left atrial size was normal in size. Right Atrium: Right atrial size was not well visualized. Pericardium: The pericardium was not well visualized. Mitral Valve: The mitral valve is normal in structure. Mild mitral annular calcification. No evidence of mitral valve regurgitation. No evidence of mitral valve stenosis. MV peak gradient, 6.8 mmHg. The mean mitral valve gradient is 3.0 mmHg. Tricuspid Valve: The tricuspid valve is normal in structure. Tricuspid valve regurgitation is mild. Aortic Valve: The aortic valve is tricuspid. There is moderate thickening of the aortic valve. Aortic valve regurgitation is not visualized. Aortic valve sclerosis is present, with no  evidence of aortic valve stenosis. Aortic valve mean gradient measures  5.0 mmHg. Aortic valve peak gradient measures 11.0 mmHg. Aortic valve area, by VTI measures 2.65 cm. Pulmonic Valve: The pulmonic valve was grossly normal. Pulmonic valve regurgitation is trivial. No evidence of pulmonic stenosis. Aorta: The aortic root is normal in size and structure. Pulmonary Artery: The pulmonary artery is of normal size. Venous: The inferior vena cava was not well visualized. IAS/Shunts: The interatrial septum was not well visualized.  LEFT VENTRICLE PLAX 2D LVIDd:         3.80 cm   Diastology LVIDs:         2.50 cm   LV e' medial:    8.60 cm/s LV PW:         1.15 cm   LV E/e' medial:  12.3 LV IVS:        1.06 cm   LV e' lateral:   7.22 cm/s LVOT diam:     2.10 cm   LV E/e' lateral: 14.7 LV SV:         79 LV SV Index:   42 LVOT Area:     3.46 cm  LEFT ATRIUM           Index LA diam:      3.60 cm 1.92 cm/m LA Vol (A4C): 60.2 ml 32.13 ml/m  AORTIC VALVE                     PULMONIC VALVE AV Area (Vmax):    2.94 cm      PV Vmax:       0.94 m/s AV Area (Vmean):   2.62 cm      PV Peak grad:  3.5 mmHg AV Area (VTI):  2.65 cm AV Vmax:           166.00 cm/s AV Vmean:          107.000 cm/s AV VTI:            0.296 m AV Peak Grad:      11.0 mmHg AV Mean Grad:      5.0 mmHg LVOT Vmax:         141.00 cm/s LVOT Vmean:        81.050 cm/s LVOT VTI:          0.227 m LVOT/AV VTI ratio: 0.77  AORTA Ao Root diam: 3.30 cm MITRAL VALVE                TRICUSPID VALVE MV Area (PHT): 2.71 cm     TR Peak grad:   34.3 mmHg MV Area VTI:   2.34 cm     TR Vmax:        293.00 cm/s MV Peak grad:  6.8 mmHg MV Mean grad:  3.0 mmHg     SHUNTS MV Vmax:       1.30 m/s     Systemic VTI:  0.23 m MV Vmean:      82.3 cm/s    Systemic Diam: 2.10 cm MV Decel Time: 280 msec MV E velocity: 106.00 cm/s MV A velocity: 114.00 cm/s MV E/A ratio:  0.93 Sammy Crisp MD Electronically signed by Sammy Crisp MD Signature Date/Time: 10/02/2023/1:03:27 PM     Final    DG Chest Port 1 View Result Date: 10/01/2023 CLINICAL DATA:  Questionable sepsis - evaluate for abnormality Altered mental status.  Fever.  Hypoxemia. EXAM: PORTABLE CHEST 1 VIEW COMPARISON:  Radiographs 05/23/2021 and 07/20/2020. FINDINGS: 0932 hours. Lower lung volumes with probable vascular congestion and mildly increased left basilar atelectasis. No overt pulmonary edema or confluent airspace disease. There may be a small left pleural effusion. The heart size and mediastinal contours appears stable with aortic atherosclerosis. No acute osseous findings are seen. Telemetry leads overlie the chest. IMPRESSION: Lower lung volumes with probable vascular congestion and mildly increased left basilar atelectasis. No definite evidence of pneumonia. Electronically Signed   By: Elmon Hagedorn M.D.   On: 10/01/2023 10:12     Medications:    azithromycin 500 mg (10/02/23 0845)   ceFEPime (MAXIPIME) IV      Chlorhexidine Gluconate Cloth  6 each Topical Q0600   heparin injection (subcutaneous)  5,000 Units Subcutaneous Q8H   insulin aspart  0-9 Units Subcutaneous TID WC   tiZANidine  2 mg Oral BID   acetaminophen, ondansetron **OR** ondansetron (ZOFRAN) IV  Assessment/ Plan:  Ms. Nabria G Ghosh is a 73 y.o.  female with past medical history of hypertension, diabetes, PVD, dyslipidemia, and end-stage renal disease on dialysis. She presents to the ED via EMS for evaluation of altered mental status and fever and has been admitted under observation for Acute respiratory failure with hypoxia (HCC) [J96.01] Sepsis (HCC) [A41.9] Sepsis, due to unspecified organism, unspecified whether acute organ dysfunction present (HCC) [A41.9]  [facility]/MWF/Rt thigh Permcath  End stage renal disease on hemodialysis.  Attempted hemodialysis yesterday, unable to pull fluid due to hypotension. Due to respiratory distress, will provide additional treatment today however primary team notified of need  for pressors. Awaiting transfer to ICU   Sepsis, unknown source at this time. Admitted with fever 103, and elevated white count, 12.2. Blood culture positive for serratia marcescens. ID request removal of tunneled catheter.  Vascular notified. Has received Vancomycin and levaquin. Currently prescribed azithromycin, cefepime and levaquin.   3. Acute respiratory failure requiring Bipap. Volume overload vs pneumonia. Will attempt dialysis treatment today with pressor support.   4. Anemia of chronic kidney disease Lab Results  Component Value Date   HGB 11.2 (L) 10/02/2023    Will monitor for need of ESA.   5. Secondary Hyperparathyroidism: with outpatient labs: None available   Lab Results  Component Value Date   CALCIUM 8.6 (L) 10/02/2023   PHOS 2.5 05/24/2021    Will continue to monitor bone minerals during this admission. Currently prescribed calcitriol and cinacalcet outpatient.    LOS: 0 Amy Wu 4/15/20252:28 PM

## 2023-10-02 NOTE — Plan of Care (Signed)

## 2023-10-02 NOTE — Progress Notes (Signed)
 PHARMACY - PHYSICIAN COMMUNICATION CRITICAL VALUE ALERT - BLOOD CULTURE IDENTIFICATION (BCID)  Results for orders placed or performed during the hospital encounter of 10/01/23  Resp panel by RT-PCR (RSV, Flu A&B, Covid) Anterior Nasal Swab     Status: None   Collection Time: 10/01/23  9:12 AM   Specimen: Anterior Nasal Swab  Result Value Ref Range Status   SARS Coronavirus 2 by RT PCR NEGATIVE NEGATIVE Final    Comment: (NOTE) SARS-CoV-2 target nucleic acids are NOT DETECTED.  The SARS-CoV-2 RNA is generally detectable in upper respiratory specimens during the acute phase of infection. The lowest concentration of SARS-CoV-2 viral copies this assay can detect is 138 copies/mL. A negative result does not preclude SARS-Cov-2 infection and should not be used as the sole basis for treatment or other patient management decisions. A negative result may occur with  improper specimen collection/handling, submission of specimen other than nasopharyngeal swab, presence of viral mutation(s) within the areas targeted by this assay, and inadequate number of viral copies(<138 copies/mL). A negative result must be combined with clinical observations, patient history, and epidemiological information. The expected result is Negative.  Fact Sheet for Patients:  BloggerCourse.com  Fact Sheet for Healthcare Providers:  SeriousBroker.it  This test is no t yet approved or cleared by the [place]  FDA and  has been authorized for detection and/or diagnosis of SARS-CoV-2 by FDA under an Emergency Use Authorization (EUA). This EUA will remain  in effect (meaning this test can be used) for the duration of the COVID-19 declaration under Section 564(b)(1) of the Act, 21 U.S.C.section 360bbb-3(b)(1), unless the authorization is terminated  or revoked sooner.       Influenza A by PCR NEGATIVE NEGATIVE Final   Influenza B by PCR NEGATIVE NEGATIVE Final     Comment: (NOTE) The Xpert Xpress SARS-CoV-2/FLU/RSV plus assay is intended as an aid in the diagnosis of influenza from Nasopharyngeal swab specimens and should not be used as a sole basis for treatment. Nasal washings and aspirates are unacceptable for Xpert Xpress SARS-CoV-2/FLU/RSV testing.  Fact Sheet for Patients: BloggerCourse.com  Fact Sheet for Healthcare Providers: SeriousBroker.it  This test is not yet approved or cleared by the [place]  FDA and has been authorized for detection and/or diagnosis of SARS-CoV-2 by FDA under an Emergency Use Authorization (EUA). This EUA will remain in effect (meaning this test can be used) for the duration of the COVID-19 declaration under Section 564(b)(1) of the Act, 21 U.S.C. section 360bbb-3(b)(1), unless the authorization is terminated or revoked.     Resp Syncytial Virus by PCR NEGATIVE NEGATIVE Final    Comment: (NOTE) Fact Sheet for Patients: BloggerCourse.com  Fact Sheet for Healthcare Providers: SeriousBroker.it  This test is not yet approved or cleared by the [place]  FDA and has been authorized for detection and/or diagnosis of SARS-CoV-2 by FDA under an Emergency Use Authorization (EUA). This EUA will remain in effect (meaning this test can be used) for the duration of the COVID-19 declaration under Section 564(b)(1) of the Act, 21 U.S.C. section 360bbb-3(b)(1), unless the authorization is terminated or revoked.  Performed at [facility], [address] [place], [place] [address]   Blood Culture (routine x 2)     Status: None (Preliminary result)   Collection Time: 10/01/23  9:12 AM   Specimen: BLOOD  Result Value Ref Range Status   Specimen Description BLOOD BLOOD LEFT FOREARM  Final   Special Requests   Final    BOTTLES DRAWN AEROBIC AND  ANAEROBIC Blood Culture results may not be optimal due  to an inadequate volume of blood received in culture bottles   Culture  Setup Time   Final    GRAM NEGATIVE RODS ANAEROBIC BOTTLE ONLY CRITICAL VALUE NOTED.  VALUE IS CONSISTENT WITH PREVIOUSLY REPORTED AND CALLED VALUE. Performed at [facility], [address] [place], [place] [address]    Culture PENDING  Incomplete   Report Status PENDING  Incomplete  Blood Culture (routine x 2)     Status: None (Preliminary result)   Collection Time: 10/01/23  9:33 AM   Specimen: BLOOD  Result Value Ref Range Status   Specimen Description BLOOD RIGHT WRIST  Final   Special Requests   Final    BOTTLES DRAWN AEROBIC AND ANAEROBIC Blood Culture adequate volume   Culture  Setup Time   Final    Organism ID to follow GRAM NEGATIVE RODS IN BOTH AEROBIC AND ANAEROBIC BOTTLES CRITICAL RESULT CALLED TO, READ BACK BY AND VERIFIED WITHNoe Bath AT 6045 10/02/23 JG Performed at [facility], [address] [place], [place] [address]    Culture GRAM NEGATIVE RODS  Final   Report Status PENDING  Incomplete  Blood Culture ID Panel (Reflexed)     Status: Abnormal   Collection Time: 10/01/23  9:33 AM  Result Value Ref Range Status   Enterococcus faecalis NOT DETECTED NOT DETECTED Final   Enterococcus Faecium NOT DETECTED NOT DETECTED Final   Listeria monocytogenes NOT DETECTED NOT DETECTED Final   Staphylococcus species NOT DETECTED NOT DETECTED Final   Staphylococcus aureus (BCID) NOT DETECTED NOT DETECTED Final   Staphylococcus epidermidis NOT DETECTED NOT DETECTED Final   Staphylococcus lugdunensis NOT DETECTED NOT DETECTED Final   Streptococcus species NOT DETECTED NOT DETECTED Final   Streptococcus agalactiae NOT DETECTED NOT DETECTED Final   Streptococcus pneumoniae NOT DETECTED NOT DETECTED Final   Streptococcus pyogenes NOT DETECTED NOT DETECTED Final   A.calcoaceticus-baumannii NOT DETECTED NOT DETECTED Final   Bacteroides fragilis NOT DETECTED NOT DETECTED Final    Enterobacterales DETECTED (A) NOT DETECTED Final    Comment: Enterobacterales represent a large order of gram negative bacteria, not a single organism. CRITICAL RESULT CALLED TO, READ BACK BY AND VERIFIED WITH:  Darling Cieslewicz AT 0457 10/02/23 JG    Enterobacter cloacae complex NOT DETECTED NOT DETECTED Final   Escherichia coli NOT DETECTED NOT DETECTED Final   Klebsiella aerogenes NOT DETECTED NOT DETECTED Final   Klebsiella oxytoca NOT DETECTED NOT DETECTED Final   Klebsiella pneumoniae NOT DETECTED NOT DETECTED Final   Proteus species NOT DETECTED NOT DETECTED Final   Salmonella species NOT DETECTED NOT DETECTED Final   Serratia marcescens DETECTED (A) NOT DETECTED Final    Comment: CRITICAL RESULT CALLED TO, READ BACK BY AND VERIFIED WITH:  Torrence Branagan AT 0457 10/02/23 JG    Haemophilus influenzae NOT DETECTED NOT DETECTED Final   Neisseria meningitidis NOT DETECTED NOT DETECTED Final   Pseudomonas aeruginosa NOT DETECTED NOT DETECTED Final   Stenotrophomonas maltophilia NOT DETECTED NOT DETECTED Final   Candida albicans NOT DETECTED NOT DETECTED Final   Candida auris NOT DETECTED NOT DETECTED Final   Candida glabrata NOT DETECTED NOT DETECTED Final   Candida krusei NOT DETECTED NOT DETECTED Final   Candida parapsilosis NOT DETECTED NOT DETECTED Final   Candida tropicalis NOT DETECTED NOT DETECTED Final   Cryptococcus neoformans/gattii NOT DETECTED NOT DETECTED Final   CTX-M ESBL NOT DETECTED NOT DETECTED Final   Carbapenem  resistance IMP NOT DETECTED NOT DETECTED Final   Carbapenem resistance KPC NOT DETECTED NOT DETECTED Final   Carbapenem resistance NDM NOT DETECTED NOT DETECTED Final   Carbapenem resist OXA 48 LIKE NOT DETECTED NOT DETECTED Final   Carbapenem resistance VIM NOT DETECTED NOT DETECTED Final    Comment: Performed at [facility], [address], [place], [place] [address]    BCID Results: 3 of 4 bottles with Serratia marcescens, no resistance detected.   Pt initially ordered Levaquin but is currently on Azithromycin and Ceftriaxone for CAP.    Name of provider contacted: Kevon Pellegrini, MD   Changes to prescribed antibiotics required: Transition pt from Ceftriaxone to Cefepime.  Coretta Dexter, PharmD, [facility] 10/02/2023 5:08 AM

## 2023-10-02 NOTE — Consult Note (Signed)
 Hospital Consult    Reason for Consult:  [facility] Cath removal for positive blood cultures Requesting Physician:  Anola King NP  MRN #:  [id number]  History of Present Illness: This is a 73 y.o. female presents to [facility] emergency room via EMS with increasing somnolence.  She states that she had not been feeling well for the past week.  When EMS arrived her temperature was 103 and she was given a fluid bolus.  Placed on 6 L nasal cannula oxygen.  Upon workup she was noted to have positive blood cultures.  She is noted to have Serratia on blood cultures.  Surgery was consulted to remove the new permacatheter that was placed last week by Dr. Devon Fogo on 09/24/2023.  Past Medical History:  Diagnosis Date   Arthritis    Chronic kidney disease    Complication of anesthesia    Diabetes mellitus without complication (HCC)    Hyperlipidemia    Peripheral vascular disease (HCC)     Past Surgical History:  Procedure Laterality Date   A/V SHUNT INTERVENTION Left 11/20/2019   Procedure: A/V SHUNT INTERVENTION (Left Thigh);  Surgeon: Celso College, MD;  Location: [facility];  Service: Cardiovascular;  Laterality: Left;   A/V SHUNTOGRAM Left 02/16/2017   Procedure: A/V Shuntogram;  Surgeon: Celso College, MD;  Location: [facility];  Service: Cardiovascular;  Laterality: Left;   A/V SHUNTOGRAM Left 12/13/2017   Procedure: A/V SHUNTOGRAM;  Surgeon: Celso College, MD;  Location: [facility];  Service: Cardiovascular;  Laterality: Left;   A/V SHUNTOGRAM Left 12/05/2018   Procedure: A/V SHUNTOGRAM;  Surgeon: Celso College, MD;  Location: [facility];  Service: Cardiovascular;  Laterality: Left;   A/V SHUNTOGRAM Left 06/03/2020   Procedure: A/V SHUNTOGRAM;  Surgeon: Celso College, MD;  Location: [facility];  Service: Cardiovascular;  Laterality: Left;   A/V SHUNTOGRAM Left 05/24/2021   Procedure: A/V SHUNTOGRAM;  Surgeon: Jackquelyn Mass, MD;  Location:  [facility];  Service: Cardiovascular;  Laterality: Left;  Thigh Graft   ABOVE KNEE LEG AMPUTATION     AV FISTULA PLACEMENT Bilateral    AV FISTULA PLACEMENT Left 07/01/2015   Procedure: INSERTION OF ARTERIOVENOUS (AV) GRAFT THIGH (ARTEGRAFT);  Surgeon: Celso College, MD;  Location: [facility];  Service: Vascular;  Laterality: Left;   BELOW KNEE LEG AMPUTATION Left    BREAST BIOPSY     CATARACT EXTRACTION W/PHACO Left 07/10/2022   Procedure: CATARACT EXTRACTION PHACO AND INTRAOCULAR LENS PLACEMENT (IOC) LEFT DIABETIC  12.98  01:13.7;  Surgeon: Rosa College, MD;  Location: [facility];  Service: Ophthalmology;  Laterality: Left;   DIALYSIS/PERMA CATHETER INSERTION N/A 05/26/2021   Procedure: DIALYSIS/PERMA CATHETER INSERTION;  Surgeon: Celso College, MD;  Location: [facility];  Service: Cardiovascular;  Laterality: N/A;   DIALYSIS/PERMA CATHETER INSERTION N/A 07/05/2022   Procedure: DIALYSIS/PERMA CATHETER INSERTION;  Surgeon: Celso College, MD;  Location: [facility];  Service: Cardiovascular;  Laterality: N/A;   DIALYSIS/PERMA CATHETER INSERTION N/A 07/28/2022   Procedure: DIALYSIS/PERMA CATHETER INSERTION;  Surgeon: Celso College, MD;  Location: [facility];  Service: Cardiovascular;  Laterality: N/A;   DIALYSIS/PERMA CATHETER INSERTION N/A 09/21/2022   Procedure: DIALYSIS/PERMA CATHETER INSERTION;  Surgeon: Celso College, MD;  Location: [facility];  Service: Cardiovascular;  Laterality: N/A;   DIALYSIS/PERMA CATHETER INSERTION N/A 02/15/2023   Procedure: DIALYSIS/PERMA CATHETER INSERTION;  Surgeon: Annice Needy, MD;  Location: [facility];  Service: Cardiovascular;  Laterality: N/A;   DIALYSIS/PERMA CATHETER INSERTION N/A 04/09/2023   Procedure: DIALYSIS/PERMA CATHETER INSERTION;  Surgeon: Renford Dills, MD;  Location: [facility];  Service: Cardiovascular;  Laterality: N/A;   DIALYSIS/PERMA CATHETER REPAIR Right 09/24/2023    Procedure: DIALYSIS/PERMA CATHETER REPAIR;  Surgeon: Renford Dills, MD;  Location: [facility];  Service: Cardiovascular;  Laterality: Right;   ENDARTERECTOMY FEMORAL Left 07/01/2015   Procedure: SUPERFICIAL FEMORAL ENDARTERECTOMY ;  Surgeon: Annice Needy, MD;  Location: [facility];  Service: Vascular;  Laterality: Left;   EXCHANGE OF A DIALYSIS CATHETER  02/11/2015   Procedure: Exchange Of A Dialysis Catheter;  Surgeon: Annice Needy, MD;  Location: [facility];  Service: Cardiovascular;;   LEG AMPUTATION ABOVE KNEE Right    PERIPHERAL VASCULAR CATHETERIZATION N/A 02/11/2015   Procedure: Dialysis/Perma Catheter Insertion;  Surgeon: Annice Needy, MD;  Location: [facility];  Service: Cardiovascular;  Laterality: N/A;   PERIPHERAL VASCULAR CATHETERIZATION N/A 05/11/2015   Procedure: Dialysis/Perma Catheter Insertion, exchange;  Surgeon: Renford Dills, MD;  Location: [facility];  Service: Cardiovascular;  Laterality: N/A;   PERIPHERAL VASCULAR CATHETERIZATION N/A 05/18/2015   Procedure: Dialysis/Perma Catheter Insertion;  Surgeon: Renford Dills, MD;  Location: [facility];  Service: Cardiovascular;  Laterality: N/A;   PERIPHERAL VASCULAR CATHETERIZATION Left 07/19/2015   Procedure: A/V Shuntogram/Fistulagram;  Surgeon: Annice Needy, MD;  Location: [facility];  Service: Cardiovascular;  Laterality: Left;   PERIPHERAL VASCULAR CATHETERIZATION N/A 07/19/2015   Procedure: A/V Shunt Intervention;  Surgeon: Annice Needy, MD;  Location: [facility];  Service: Cardiovascular;  Laterality: N/A;   PERIPHERAL VASCULAR CATHETERIZATION N/A 08/26/2015   Procedure: Dialysis/Perma Catheter Removal;  Surgeon: Annice Needy, MD;  Location: [facility];  Service: Cardiovascular;  Laterality: N/A;   PERIPHERAL VASCULAR THROMBECTOMY N/A 12/29/2016   Procedure: Peripheral Vascular Thrombectomy;  Surgeon: Renford Dills, MD;  Location: [facility];  Service: Cardiovascular;  Laterality: N/A;    Allergies  Allergen Reactions   Ancef [Cefazolin] Palpitations   Contrast Media [Iodinated Contrast Media] Palpitations    Prior to Admission medications   Medication Sig Start Date End Date Taking? Authorizing Provider  acetaminophen (TYLENOL) 500 MG tablet Take 1,000 mg by mouth every 8 (eight) hours as needed for moderate pain.   Yes [provider]  aspirin EC 81 MG tablet Take 81 mg by mouth daily. 11/11/20  Yes [provider]  atorvastatin (LIPITOR) 40 MG tablet Take 40 mg by mouth daily.   Yes [provider]  fluticasone (FLONASE) 50 MCG/ACT nasal spray Place 1 spray into both nostrils daily.   Yes [provider]  gabapentin (NEURONTIN) 100 MG capsule Take 100 mg by mouth 3 (three) times daily as needed. M/W/F after dialysis PRN for leg/thigh cramps   Yes [provider]  glucose 4 GM chewable tablet Chew 1 tablet by mouth as needed for low blood sugar. If blood sugar less than 70. Chew 3 tablets and recheck blood sugar in 15 minutes   Yes [provider]  JANUVIA 25 MG tablet Take 25 mg by mouth daily. 11/02/20  Yes [provider]  lanthanum (FOSRENOL) 1000 MG chewable tablet Chew 1,000 mg by mouth 2 (two) times daily with a meal.    Yes [provider]  LOKELMA 10 g PACK packet Take 1 packet by mouth as directed. Taken on Sat and Sundays midday 09/25/23  Yes [provider]  loratadine (CLARITIN) 10 MG tablet Take 10 mg by mouth daily as needed for allergies.   Yes [provider]  Multiple Vitamins-Minerals (PRESERVISION AREDS 2) CAPS Take 1 capsule by mouth 2 (two) times daily. 09/18/23  Yes [provider]  senna-docusate (SENOKOT-S) 8.6-50 MG tablet Take 1-2 tablets by mouth at bedtime as needed for mild constipation or moderate constipation. 1-2 tablets at bedtime if needed   Yes [provider]  STOOL SOFTENER 100 MG  capsule Take 100 mg by mouth 2 (two) times daily. 09/18/23  Yes [provider]  tiZANidine (ZANAFLEX) 4 MG tablet Take 4 mg by mouth 2 (two) times daily as needed for muscle spasms. 09/25/23  Yes [provider]  calcitRIOL (ROCALTROL) 0.25 MCG capsule Take 0.25 mcg by mouth daily. Take 3 capsules by mouth three times week at dialysis    [provider]  cinacalcet (SENSIPAR) 30 MG tablet Take 30 mg by mouth daily with supper.     [provider]  COMFORT EZ PEN NEEDLES 31G X 6 MM MISC Inject into the skin. 03/17/21   [provider]  ferrous sulfate 325 (65 FE) MG tablet Take 325 mg by mouth every other day. Patient not taking: Reported on 02/15/2023    [provider]  glucose blood test strip 1 each by Other route as needed for other. Use as instructed    [provider]  ketotifen (ZADITOR) 0.025 % ophthalmic solution Place 1 drop into both eyes 2 (two) times daily. Patient not taking: Reported on 04/09/2023    [provider]  ReliOn Lancet Devices 30G MISC Apply topically. Patient not taking: Reported on 06/30/2022 03/17/21   [provider]  TRUEplus Lancets 28G MISC Apply topically. 03/17/21   [provider]  Stann Ore INSULIN SYRINGE 31G X 5/16" 0.3 ML MISC SMARTSIG:Injection Daily 03/31/21   [provider]    Social History   Socioeconomic History   Marital status: Single    Spouse name: Not on file   Number of children: Not on file   Years of education: Not on file   Highest education level: Not on file  Occupational History   Not on file  Tobacco Use   Smoking status: Never   Smokeless tobacco: Never  Vaping Use   Vaping status: Never Used  Substance and Sexual Activity   Alcohol use: No   Drug use: No   Sexual activity: Not on file  Other Topics Concern   Not on file  Social History Narrative   Elmer Sow, friend, lives with and have been together 33 years.   Social  Drivers of Corporate investment banker Strain: Not on file  Food Insecurity: No Food Insecurity (10/01/2023)   Hunger Vital Sign    Worried About Running Out of Food in the Last Year: Never true    Ran Out of Food in the Last Year: Never true  Transportation Needs: No Transportation Needs (10/01/2023)   PRAPARE - Administrator, Civil Service (Medical): No    Lack of Transportation (Non-Medical): No  Physical Activity: Not on file  Stress: Not on file  Social Connections: Moderately Isolated (10/01/2023)   Social Connection and Isolation Panel [NHANES]    Frequency of Communication with Friends and Family: More than three times a week  Frequency of Social Gatherings with Friends and Family: More than three times a week    Attends Religious Services: Never    Database administrator or Organizations: No    Attends Banker Meetings: Never    Marital Status: Living with partner  Intimate Partner Violence: Not At Risk (10/01/2023)   Humiliation, Afraid, Rape, and Kick questionnaire    Fear of Current or Ex-Partner: No    Emotionally Abused: No    Physically Abused: No    Sexually Abused: No     Family History  Problem Relation Age of Onset   Breast cancer Neg Hx     ROS: Otherwise negative unless mentioned in HPI  Physical Examination  Vitals:   10/02/23 0511 10/02/23 0700  BP: (!) 136/53 (!) 154/64  Pulse: 71 79  Resp: 14   Temp: 99.5 F (37.5 C) (!) 102.4 F (39.1 C)  SpO2: 94%    Body mass index is 47.66 kg/m.  General:  WDWN in NAD Gait: Not observed HENT: WNL, normocephalic Pulmonary: normal non-labored breathing, without Rales, rhonchi,  wheezing Cardiac: regular, without  Murmurs, rubs or gallops; without carotid bruits Abdomen: Positive bowel sounds throughout, soft, NT/ND, no masses Skin: without rashes Vascular Exam/Pulses: Bilateral AKA  Extremities: without ischemic changes, without Gangrene , without cellulitis; without open  wounds;  Musculoskeletal: no muscle wasting or atrophy  Neurologic: A&O X 3;  No focal weakness or paresthesias are detected; speech is fluent/normal Psychiatric:  The pt has Normal affect. Lymph:  Unremarkable  CBC    Component Value Date/Time   WBC 8.1 10/02/2023 0600   RBC 4.30 10/02/2023 0600   HGB 11.2 (L) 10/02/2023 0600   HGB 12.4 12/03/2012 1414   HCT 34.2 (L) 10/02/2023 0600   HCT 38.0 12/03/2012 1414   PLT 108 (L) 10/02/2023 0600   PLT 193 12/03/2012 1414   MCV 79.5 (L) 10/02/2023 0600   MCV 81 12/03/2012 1414   MCH 26.0 10/02/2023 0600   MCHC 32.7 10/02/2023 0600   RDW 17.3 (H) 10/02/2023 0600   RDW 17.7 (H) 12/03/2012 1414   LYMPHSABS 0.7 10/01/2023 0912   MONOABS 0.8 10/01/2023 0912   EOSABS 0.0 10/01/2023 0912   BASOSABS 0.0 10/01/2023 0912    BMET    Component Value Date/Time   NA 133 (L) 10/02/2023 0600   NA 138 12/03/2012 1414   K 3.5 10/02/2023 0600   K 4.5 12/03/2012 1414   CL 98 10/02/2023 0600   CL 101 12/03/2012 1414   CO2 24 10/02/2023 0600   CO2 27 12/03/2012 1414   GLUCOSE 147 (H) 10/02/2023 0600   GLUCOSE 326 (H) 12/03/2012 1414   BUN 23 10/02/2023 0600   BUN 37 (H) 12/03/2012 1414   CREATININE 5.13 (H) 10/02/2023 0600   CREATININE 7.04 (H) 12/03/2012 1414   CALCIUM 8.6 (L) 10/02/2023 0600   CALCIUM 8.4 (L) 12/03/2012 1414   GFRNONAA 8 (L) 10/02/2023 0600   GFRNONAA 6 (L) 12/03/2012 1414   GFRAA 9 (L) 11/19/2019 1002   GFRAA 7 (L) 12/03/2012 1414    COAGS: Lab Results  Component Value Date   INR 1.4 (H) 10/01/2023   INR 1.1 05/23/2021   INR 1.0 11/19/2019     Non-Invasive Vascular Imaging:   None Ordered  Statin:  Yes.   Beta Blocker:  No. Aspirin:  Yes.   ACEI:  No. ARB:  No. CCB use:  No Other antiplatelets/anticoagulants:  No.    ASSESSMENT/PLAN: This is a  73 y.o. female who presents to [facility] emergency after multiple days of not feeling well and increased somnolence.  Upon workup she was noted to have positive  blood cultures.  On 09/24/2023 a new dialysis perma catheter was placed as her previous one had been pulled out by her dialysis center.  Vascular surgery was consulted to remove the catheter.  I discussed the case in detail with Dr. Mikki Alexander.  He agrees with the plan.  Vascular catheter will be removed today.    [name] [facility] 10/02/2023 9:50 AM

## 2023-10-03 DIAGNOSIS — G9340 Encephalopathy, unspecified: Secondary | ICD-10-CM | POA: Diagnosis not present

## 2023-10-03 DIAGNOSIS — A4153 Sepsis due to Serratia: Secondary | ICD-10-CM

## 2023-10-03 DIAGNOSIS — R7881 Bacteremia: Secondary | ICD-10-CM | POA: Diagnosis not present

## 2023-10-03 DIAGNOSIS — I200 Unstable angina: Secondary | ICD-10-CM

## 2023-10-03 DIAGNOSIS — J9601 Acute respiratory failure with hypoxia: Secondary | ICD-10-CM

## 2023-10-03 DIAGNOSIS — T827XXA Infection and inflammatory reaction due to other cardiac and vascular devices, implants and grafts, initial encounter: Secondary | ICD-10-CM | POA: Diagnosis not present

## 2023-10-03 DIAGNOSIS — N186 End stage renal disease: Secondary | ICD-10-CM | POA: Diagnosis not present

## 2023-10-03 DIAGNOSIS — A419 Sepsis, unspecified organism: Secondary | ICD-10-CM

## 2023-10-03 DIAGNOSIS — Z992 Dependence on renal dialysis: Secondary | ICD-10-CM | POA: Diagnosis not present

## 2023-10-03 DIAGNOSIS — R9431 Abnormal electrocardiogram [ECG] [EKG]: Secondary | ICD-10-CM

## 2023-10-03 DIAGNOSIS — B9689 Other specified bacterial agents as the cause of diseases classified elsewhere: Secondary | ICD-10-CM | POA: Diagnosis not present

## 2023-10-03 DIAGNOSIS — I214 Non-ST elevation (NSTEMI) myocardial infarction: Secondary | ICD-10-CM

## 2023-10-03 LAB — TROPONIN I (HIGH SENSITIVITY)
Troponin I (High Sensitivity): 6721 ng/L (ref <18)
Troponin I (High Sensitivity): 6948 ng/L (ref <18)
Troponin I (High Sensitivity): 5122 ng/L

## 2023-10-03 LAB — LIPID PANEL
Cholesterol: 90 mg/dL (ref 0–200)
HDL: 16 mg/dL — ABNORMAL LOW (ref >40)
LDL Cholesterol: 44 mg/dL (ref 0–99)
Total CHOL/HDL Ratio: 5.6 ratio
Triglycerides: 148 mg/dL (ref <150)
VLDL: 30 mg/dL (ref 0–40)

## 2023-10-03 LAB — HEPARIN LEVEL (UNFRACTIONATED): Heparin Unfractionated: 0.1 [IU]/mL — ABNORMAL LOW (ref 0.30–0.70)

## 2023-10-03 LAB — PROTIME-INR
INR: 1.2 (ref 0.8–1.2)
Prothrombin Time: 15.1 s (ref 11.4–15.2)

## 2023-10-03 LAB — APTT: aPTT: 35 s (ref 24–36)

## 2023-10-03 LAB — BASIC METABOLIC PANEL WITH GFR
Anion gap: 15 (ref 5–15)
BUN: 32 mg/dL — ABNORMAL HIGH (ref 8–23)
CO2: 21 mmol/L — ABNORMAL LOW (ref 22–32)
Calcium: 8.3 mg/dL — ABNORMAL LOW (ref 8.9–10.3)
Chloride: 97 mmol/L — ABNORMAL LOW (ref 98–111)
Creatinine, Ser: 6.23 mg/dL — ABNORMAL HIGH (ref 0.44–1.00)
GFR, Estimated: 7 mL/min — ABNORMAL LOW (ref >60)
Glucose, Bld: 148 mg/dL — ABNORMAL HIGH (ref 70–99)
Potassium: 3.3 mmol/L — ABNORMAL LOW (ref 3.5–5.1)
Sodium: 133 mmol/L — ABNORMAL LOW (ref 135–145)

## 2023-10-03 LAB — GLUCOSE, CAPILLARY
Glucose-Capillary: 183 mg/dL — ABNORMAL HIGH (ref 70–99)
Glucose-Capillary: 165 mg/dL — ABNORMAL HIGH (ref 70–99)
Glucose-Capillary: 113 mg/dL — ABNORMAL HIGH (ref 70–99)
Glucose-Capillary: 174 mg/dL — ABNORMAL HIGH (ref 70–99)

## 2023-10-03 LAB — CBC
HCT: 32.4 % — ABNORMAL LOW (ref 36.0–46.0)
Hemoglobin: 10.9 g/dL — ABNORMAL LOW (ref 12.0–15.0)
MCH: 26.5 pg (ref 26.0–34.0)
MCHC: 33.6 g/dL (ref 30.0–36.0)
MCV: 78.8 fL — ABNORMAL LOW (ref 80.0–100.0)
Platelets: 107 10*3/uL — ABNORMAL LOW (ref 150–400)
RBC: 4.11 MIL/uL (ref 3.87–5.11)
RDW: 17.5 % — ABNORMAL HIGH (ref 11.5–15.5)
WBC: 5.2 10*3/uL (ref 4.0–10.5)
nRBC: 0.4 % — ABNORMAL HIGH (ref 0.0–0.2)

## 2023-10-03 LAB — MAGNESIUM: Magnesium: 1.9 mg/dL (ref 1.7–2.4)

## 2023-10-03 LAB — PHOSPHORUS: Phosphorus: 4 mg/dL (ref 2.5–4.6)

## 2023-10-03 MED ORDER — HEPARIN (PORCINE) 25000 UT/250ML-% IV SOLN
1250.0000 [IU]/h | INTRAVENOUS | Status: DC
  Administered 2023-10-03: 800 [IU]/h via INTRAVENOUS
  Administered 2023-10-04: 1250 [IU]/h via INTRAVENOUS
  Filled 2023-10-03 (×2): qty 250

## 2023-10-03 MED ORDER — ASPIRIN 81 MG PO CHEW
81.0000 mg | CHEWABLE_TABLET | ORAL | Status: AC
  Administered 2023-10-04: 81 mg via ORAL
  Filled 2023-10-03: qty 1

## 2023-10-03 MED ORDER — POTASSIUM CHLORIDE CRYS ER 20 MEQ PO TBCR
20.0000 meq | EXTENDED_RELEASE_TABLET | Freq: Once | ORAL | Status: AC
  Administered 2023-10-03: 20 meq via ORAL
  Filled 2023-10-03: qty 1

## 2023-10-03 MED ORDER — HEPARIN BOLUS VIA INFUSION
2000.0000 [IU] | Freq: Once | INTRAVENOUS | Status: AC
  Administered 2023-10-03: 2000 [IU] via INTRAVENOUS
  Filled 2023-10-03: qty 2000

## 2023-10-03 MED ORDER — MIDODRINE HCL 5 MG PO TABS
5.0000 mg | ORAL_TABLET | Freq: Three times a day (TID) | ORAL | Status: DC
  Administered 2023-10-03 – 2023-10-04 (×4): 5 mg via ORAL
  Filled 2023-10-03 (×4): qty 1

## 2023-10-03 MED ORDER — SODIUM CHLORIDE 0.9 % IV SOLN: INTRAVENOUS | Status: DC

## 2023-10-03 MED ORDER — GERHARDT'S BUTT CREAM
TOPICAL_CREAM | CUTANEOUS | Status: DC | PRN
Start: 2023-10-03 — End: 2023-10-13
  Administered 2023-10-03: 1 via TOPICAL
  Filled 2023-10-03: qty 60

## 2023-10-03 NOTE — Progress Notes (Signed)
 Date of Admission:  10/01/2023      ID: Amy Wu is a 73 y.o. female Principal Problem:   Sepsis (HCC) Active Problems:   Type 2 diabetes mellitus (HCC)   Hyperlipidemia, unspecified   ESRD on dialysis (HCC)   Acute respiratory failure with hypoxia (HCC)   Encephalopathy    Subjective: Over night patient was transferred to ICU for hypotension, increase in troponin Tunneled cath was removed last night by ICU RN   Says she is doing much better Very alert today  Medications:   Chlorhexidine Gluconate Cloth  6 each Topical Q0600   insulin aspart  0-9 Units Subcutaneous TID WC   midodrine  5 mg Oral TID WC   potassium chloride  20 mEq Oral Once    Objective: Vital signs in last 24 hours: Patient Vitals for the past 24 hrs:  BP Temp Temp src Pulse Resp SpO2 Weight  10/03/23 1100 (!) 102/48 -- -- (!) 51 (!) 9 100 % --  10/03/23 1055 -- -- -- (!) 39 (!) 22 100 % --  10/03/23 1050 -- -- -- (!) 38 (!) 8 100 % --  10/03/23 1045 (!) 94/50 -- -- (!) 52 17 100 % --  10/03/23 1040 -- -- -- (!) 52 16 100 % --  10/03/23 1035 -- -- -- (!) 53 12 100 % --  10/03/23 1030 (!) 100/44 -- -- (!) 52 16 100 % --  10/03/23 1025 -- -- -- (!) 53 15 100 % --  10/03/23 1020 -- -- -- (!) 114 11 100 % --  10/03/23 1015 -- -- -- (!) 57 13 100 % --  10/03/23 1010 -- -- -- 61 (!) 9 100 % --  10/03/23 1005 -- -- -- (!) 55 12 98 % --  10/03/23 1000 (!) 112/49 -- -- 60 13 100 % --  10/03/23 0945 124/78 -- -- (!) 40 (!) 9 100 % --  10/03/23 0930 136/61 -- -- 62 19 100 % --  10/03/23 0915 138/63 -- -- (!) 59 20 100 % --  10/03/23 0900 (!) 126/43 -- -- (!) 50 12 100 % --  10/03/23 0845 (!) 115/57 -- -- 61 12 100 % --  10/03/23 0830 (!) 134/51 -- -- (!) 51 16 100 % --  10/03/23 0815 (!) 121/55 -- -- 81 (!) 0 100 % --  10/03/23 0800 (!) 127/51 98 F (36.7 C) Axillary (!) 51 (!) 9 100 % --  10/03/23 0745 104/87 -- -- 62 20 100 % --  10/03/23 0730 122/79 -- -- 64 (!) 5 100 % --  10/03/23 0729 -- --  -- (!) 44 11 100 % --  10/03/23 0715 (!) 124/46 -- -- (!) 46 19 100 % --  10/03/23 0700 (!) 106/52 -- -- 72 18 100 % --  10/03/23 0645 (!) 118/51 -- -- 72 18 100 % --  10/03/23 0630 (!) 107/46 -- -- 63 20 100 % --  10/03/23 0615 (!) 110/56 -- -- (!) 58 13 100 % --  10/03/23 0600 (!) 107/42 -- -- 71 20 100 % --  10/03/23 0545 99/70 -- -- 73 17 100 % --  10/03/23 0530 (!) 118/47 -- -- 61 19 100 % --  10/03/23 0515 (!) 120/53 -- -- 67 (!) 21 100 % --  10/03/23 0500 (!) 127/46 98.6 F (37 C) Axillary 78 15 100 % --  10/03/23 0445 (!) 111/43 -- -- 73 (!) 22 100 % --  10/03/23  0430 (!) 125/51 -- -- 67 12 100 % --  10/03/23 0415 (!) 133/46 -- -- 69 19 100 % --  10/03/23 0400 (!) 136/44 -- -- 70 (!) 21 100 % --  10/03/23 0345 (!) 123/37 -- -- 65 (!) 22 100 % --  10/03/23 0335 (!) 96/41 -- -- 72 15 100 % --  10/03/23 0330 (!) 129/42 -- -- 67 (!) 23 100 % --  10/03/23 0300 (!) 123/50 -- -- 67 (!) 28 100 % --  10/03/23 0235 104/82 -- -- 68 17 97 % --  10/03/23 0230 (!) 114/39 -- -- 79 18 100 % --  10/03/23 0225 (!) 94/41 -- -- 78 (!) 27 93 % --  10/03/23 0210 (!) 88/44 -- -- 79 19 94 % --  10/03/23 0100 (!) 128/109 99 F (37.2 C) Oral 84 19 100 % --  10/03/23 0030 -- -- -- 92 -- -- --  10/03/23 0000 92/69 -- -- 84 (!) 23 100 % --  10/02/23 2345 (!) 143/109 -- -- -- (!) 30 -- --  10/02/23 2330 (!) 150/55 -- -- 73 18 100 % --  10/02/23 2315 128/67 -- -- 68 19 100 % --  10/02/23 2300 (!) 75/60 -- -- 70 11 100 % --  10/02/23 2245 (!) 129/48 -- -- 72 14 100 % --  10/02/23 2230 (!) 133/53 -- -- 70 (!) 24 100 % --  10/02/23 2215 (!) 155/51 -- -- 70 (!) 27 100 % --  10/02/23 2200 (!) 143/54 -- -- 68 12 100 % --  10/02/23 2100 (!) 135/55 98.8 F (37.1 C) Oral 70 15 100 % --  10/02/23 2050 (!) 141/57 98.9 F (37.2 C) Oral 67 (!) 21 100 % --  10/02/23 2040 -- -- -- 62 13 100 % --  10/02/23 2030 (!) 154/55 -- -- 63 (!) 28 100 % --  10/02/23 2015 (!) 136/55 -- -- (!) 53 (!) 34 100 % --  10/02/23  2000 (!) 152/55 -- -- (!) 57 20 100 % --  10/02/23 1945 110/77 -- -- 63 16 100 % --  10/02/23 1930 114/81 -- -- 62 11 100 % --  10/02/23 1915 (!) 103/51 -- -- 66 (!) 24 100 % --  10/02/23 1900 131/67 -- -- 67 (!) 22 100 % --  10/02/23 1845 (!) 106/55 -- -- 71 (!) 22 100 % --  10/02/23 1830 136/60 -- -- 67 17 100 % --  10/02/23 1815 (!) 122/54 -- -- 65 18 100 % --  10/02/23 1800 (!) 146/49 -- -- (!) 46 20 100 % --  10/02/23 1755 (!) 121/58 99.2 F (37.3 C) Oral 64 20 100 % 99.9 kg  10/02/23 1700 (!) 102/54 -- -- 66 -- 99 % --      PHYSICAL EXAM:  General: Alert, oriented in pace, person, time- well oriented today.  Lungs: b/l air entry Heart: bradycardia. Abdomen: Soft, non-tender,not distended. Bowel sounds normal. No masses Extremities: rt AKA, left BKA Left thigh AVF Skin: No rashes or lesions. Or bruising Neurologic: Grossly non-focal  Lab Results    Latest Ref Rng & Units 10/03/2023    2:22 AM 10/02/2023    6:00 AM 10/01/2023    9:12 AM  CBC  WBC 4.0 - 10.5 K/uL 5.2  8.1  12.3   Hemoglobin 12.0 - 15.0 g/dL 09.8  11.9  14.7   Hematocrit 36.0 - 46.0 % 32.4  34.2  35.9   Platelets 150 - 400 K/uL  107  108  105        Latest Ref Rng & Units 10/03/2023    2:22 AM 10/02/2023    6:00 AM 10/01/2023    9:12 AM  CMP  Glucose 70 - 99 mg/dL 161  096  045   BUN 8 - 23 mg/dL 32  23  31   Creatinine 0.44 - 1.00 mg/dL 4.09  8.11  9.14   Sodium 135 - 145 mmol/L 133  133  138   Potassium 3.5 - 5.1 mmol/L 3.3  3.5  3.0   Chloride 98 - 111 mmol/L 97  98  101   CO2 22 - 32 mmol/L 21  24  23    Calcium 8.9 - 10.3 mg/dL 8.3  8.6  8.6   Total Protein 6.5 - 8.1 g/dL  6.3  6.7   Total Bilirubin 0.0 - 1.2 mg/dL  1.0  0.9   Alkaline Phos 38 - 126 U/L  68  70   AST 15 - 41 U/L  81  25   ALT 0 - 44 U/L  16  7       Microbiology:  Studies/Results: ECHOCARDIOGRAM COMPLETE Result Date: 10/02/2023    ECHOCARDIOGRAM REPORT   Patient Name:   Amy Wu Date of Exam: 10/02/2023 Medical Rec #:   [id number]     Height:       57.0 in Accession #:    [id number]    Weight:       220.2 lb Date of Birth:  Feb 28, 1951    BSA:          1.874 m Patient Age:    72 years      BP:           154/64 mmHg Patient Gender: F             HR:           89 bpm. Exam Location:  [facility] Procedure: 2D Echo, Cardiac Doppler, Color Doppler and Intracardiac            Opacification Agent (Both Spectral and Color Flow Doppler were            utilized during procedure). Indications:     Cardiomegaly  History:         Patient has no prior history of Echocardiogram examinations.                  Cardiomegaly; Risk Factors:Hypertension, Diabetes and                  Dyslipidemia. ESRD on Dialysis.  Sonographer:     Mikki Harbor Referring Phys:  [phone] Francoise Schaumann [place] Diagnosing Phys: Yvonne Kendall MD  Sonographer Comments: Technically difficult study due to poor echo windows, suboptimal apical window and patient is obese. Image acquisition challenging due to respiratory motion. IMPRESSIONS  1. Left ventricular ejection fraction, by estimation, is >55%. The left ventricle has normal function. Left ventricular endocardial border not optimally defined to evaluate regional wall motion. There is mild left ventricular hypertrophy. Left ventricular diastolic parameters are indeterminate.  2. Right ventricular systolic function is low normal. The right ventricular size is normal.  3. The mitral valve is normal in structure. No evidence of mitral valve regurgitation. No evidence of mitral stenosis.  4. The aortic valve is tricuspid. There is moderate thickening of the aortic valve. Aortic valve regurgitation is not visualized. Aortic valve sclerosis is present, with no evidence of aortic valve stenosis.  FINDINGS  Left Ventricle: Left ventricular ejection fraction, by estimation, is >55%. The left ventricle has normal function. Left ventricular endocardial border not optimally defined to evaluate regional wall motion. Definity contrast agent was  given IV to delineate the left ventricular endocardial borders. The left ventricular internal cavity size was normal in size. There is mild left ventricular hypertrophy. Left ventricular diastolic parameters are indeterminate. Right Ventricle: The right ventricular size is normal. Right vetricular wall thickness was not well visualized. Right ventricular systolic function is low normal. Left Atrium: Left atrial size was normal in size. Right Atrium: Right atrial size was not well visualized. Pericardium: The pericardium was not well visualized. Mitral Valve: The mitral valve is normal in structure. Mild mitral annular calcification. No evidence of mitral valve regurgitation. No evidence of mitral valve stenosis. MV peak gradient, 6.8 mmHg. The mean mitral valve gradient is 3.0 mmHg. Tricuspid Valve: The tricuspid valve is normal in structure. Tricuspid valve regurgitation is mild. Aortic Valve: The aortic valve is tricuspid. There is moderate thickening of the aortic valve. Aortic valve regurgitation is not visualized. Aortic valve sclerosis is present, with no evidence of aortic valve stenosis. Aortic valve mean gradient measures  5.0 mmHg. Aortic valve peak gradient measures 11.0 mmHg. Aortic valve area, by VTI measures 2.65 cm. Pulmonic Valve: The pulmonic valve was grossly normal. Pulmonic valve regurgitation is trivial. No evidence of pulmonic stenosis. Aorta: The aortic root is normal in size and structure. Pulmonary Artery: The pulmonary artery is of normal size. Venous: The inferior vena cava was not well visualized. IAS/Shunts: The interatrial septum was not well visualized.  LEFT VENTRICLE PLAX 2D LVIDd:         3.80 cm   Diastology LVIDs:         2.50 cm   LV e' medial:    8.60 cm/s LV PW:         1.15 cm   LV E/e' medial:  12.3 LV IVS:        1.06 cm   LV e' lateral:   7.22 cm/s LVOT diam:     2.10 cm   LV E/e' lateral: 14.7 LV SV:         79 LV SV Index:   42 LVOT Area:     3.46 cm  LEFT ATRIUM            Index LA diam:      3.60 cm 1.92 cm/m LA Vol (A4C): 60.2 ml 32.13 ml/m  AORTIC VALVE                     PULMONIC VALVE AV Area (Vmax):    2.94 cm      PV Vmax:       0.94 m/s AV Area (Vmean):   2.62 cm      PV Peak grad:  3.5 mmHg AV Area (VTI):     2.65 cm AV Vmax:           166.00 cm/s AV Vmean:          107.000 cm/s AV VTI:            0.296 m AV Peak Grad:      11.0 mmHg AV Mean Grad:      5.0 mmHg LVOT Vmax:         141.00 cm/s LVOT Vmean:        81.050 cm/s LVOT VTI:          0.227 m  LVOT/AV VTI ratio: 0.77  AORTA Ao Root diam: 3.30 cm MITRAL VALVE                TRICUSPID VALVE MV Area (PHT): 2.71 cm     TR Peak grad:   34.3 mmHg MV Area VTI:   2.34 cm     TR Vmax:        293.00 cm/s MV Peak grad:  6.8 mmHg MV Mean grad:  3.0 mmHg     SHUNTS MV Vmax:       1.30 m/s     Systemic VTI:  0.23 m MV Vmean:      82.3 cm/s    Systemic Diam: 2.10 cm MV Decel Time: 280 msec MV E velocity: 106.00 cm/s MV A velocity: 114.00 cm/s MV E/A ratio:  0.93 Amy End MD Electronically signed by Sammy Crisp MD Signature Date/Time: 10/02/2023/1:03:27 PM    Final      Assessment/Plan: Encephalopathy- likely due to infection- rin ESRD- resolved   Acute hypoxic resp failure -resolved   Serratia bacteremia  related to HD catheter which was changed over guidewire last week-catheter was removed last night 2 d echo valves look okay Repeat blood culture sent today On cefepime - ( if encephalopathy recurs will have to change to carbapenem)    ESRD on HD PAD DM Rt AKA and Left BKA   Anemia Thrombocytopenia   Discussed the management with the patient and her partner at bed side

## 2023-10-03 NOTE — H&P (View-Only) (Signed)
 Cardiology Consultation   Patient ID: [name] MRN: [id number]; DOB: 28-Feb-1951  Admit date: 10/01/2023 Date of Consult: 10/03/2023  PCP:  Clovis Pu, Arturo Morton, DO   [facility] Providers Cardiologist:  None        Patient Profile:   Amy Wu is a 73 y.o. female with a hx of ESRD on HD, T2DM, hyperlipidemia, and PVD who is being seen 10/03/2023 for the evaluation of elevated troponin at the request of NP Zada Girt.  History of Present Illness:   Ms. Amy Wu has no known history of heart disease and has not seen a cardiologist in the past. Her friend whom she lives with is at the bedside and assists with history as patient is drowsy from receiving muscle relaxer this morning. Patient reports several day history of not feeling herself. She reports increased somnolence and overall not feeling well. She denies chest pain, palpitations, shortness of breath, lower extremity swelling, orthopnea, bleeding, nausea, vomiting, and PND. Her friend notes that she was having a hard time keeping conversation due to somnolence. He was concerned and called EMS on 4/14. EMS found the patient to be febrile at home and gave Tylenol and normal saline bolus. O2 saturation was found to be in the high 70s to low 80s on room air. Patient was placed on 6 L nasal cannula. Upon arrival to the ED BP 114/51, meeting sepsis criteria with temperature max of 103 prior to arrival, respiration rate 20s, WBC 12.2. CBC with hemoglobin 11.6, platelets 105.  BMET with creatinine 7.42, glucose 180, and K 3. Lactate normal. BNP 1436. Respiratory panel negative.  hest x-ray shows left basilar atelectasis and vascular congestion. Blood culture was drawn and showed Serratia growing. ID was consulted and recommended removing dialysis catheter and sending tip for culture. Started on IV antibiotics. She did undergo dialysis 4/15. Vascular catheter was removed 4/15. In the early hours of 4/16, nurse noted ST elevation on  telemetry. EKG was done and felt to be similar to prior tracings per IM doctor. Nurse also noted BP dropped to 80 systolic and patient was started on Levophed and placed on BiPAP. Patient denied chest pain during this time. Troponin was drawn [phone]. Cardiology was asked to consult for elevated troponin.  Past Medical History:  Diagnosis Date   Arthritis    Chronic kidney disease    Complication of anesthesia    Diabetes mellitus without complication (HCC)    Hyperlipidemia    Peripheral vascular disease (HCC)     Past Surgical History:  Procedure Laterality Date   A/V SHUNT INTERVENTION Left 11/20/2019   Procedure: A/V SHUNT INTERVENTION (Left Thigh);  Surgeon: Annice Needy, MD;  Location: [facility];  Service: Cardiovascular;  Laterality: Left;   A/V SHUNTOGRAM Left 02/16/2017   Procedure: A/V Shuntogram;  Surgeon: Annice Needy, MD;  Location: [facility];  Service: Cardiovascular;  Laterality: Left;   A/V SHUNTOGRAM Left 12/13/2017   Procedure: A/V SHUNTOGRAM;  Surgeon: Annice Needy, MD;  Location: [facility];  Service: Cardiovascular;  Laterality: Left;   A/V SHUNTOGRAM Left 12/05/2018   Procedure: A/V SHUNTOGRAM;  Surgeon: Annice Needy, MD;  Location: [facility];  Service: Cardiovascular;  Laterality: Left;   A/V SHUNTOGRAM Left 06/03/2020   Procedure: A/V SHUNTOGRAM;  Surgeon: Annice Needy, MD;  Location: [facility];  Service: Cardiovascular;  Laterality: Left;   A/V SHUNTOGRAM Left 05/24/2021   Procedure: A/V  SHUNTOGRAM;  Surgeon: Renford Dills, MD;  Location: [facility];  Service: Cardiovascular;  Laterality: Left;  Thigh Graft   ABOVE KNEE LEG AMPUTATION     AV FISTULA PLACEMENT Bilateral    AV FISTULA PLACEMENT Left 07/01/2015   Procedure: INSERTION OF ARTERIOVENOUS (AV) GRAFT THIGH (ARTEGRAFT);  Surgeon: Annice Needy, MD;  Location: [facility];  Service: Vascular;  Laterality: Left;   BELOW KNEE LEG AMPUTATION Left     BREAST BIOPSY     CATARACT EXTRACTION W/PHACO Left 07/10/2022   Procedure: CATARACT EXTRACTION PHACO AND INTRAOCULAR LENS PLACEMENT (IOC) LEFT DIABETIC  12.98  01:13.7;  Surgeon: Nevada Crane, MD;  Location: [facility];  Service: Ophthalmology;  Laterality: Left;   DIALYSIS/PERMA CATHETER INSERTION N/A 05/26/2021   Procedure: DIALYSIS/PERMA CATHETER INSERTION;  Surgeon: Annice Needy, MD;  Location: [facility];  Service: Cardiovascular;  Laterality: N/A;   DIALYSIS/PERMA CATHETER INSERTION N/A 07/05/2022   Procedure: DIALYSIS/PERMA CATHETER INSERTION;  Surgeon: Annice Needy, MD;  Location: [facility];  Service: Cardiovascular;  Laterality: N/A;   DIALYSIS/PERMA CATHETER INSERTION N/A 07/28/2022   Procedure: DIALYSIS/PERMA CATHETER INSERTION;  Surgeon: Annice Needy, MD;  Location: [facility];  Service: Cardiovascular;  Laterality: N/A;   DIALYSIS/PERMA CATHETER INSERTION N/A 09/21/2022   Procedure: DIALYSIS/PERMA CATHETER INSERTION;  Surgeon: Annice Needy, MD;  Location: [facility];  Service: Cardiovascular;  Laterality: N/A;   DIALYSIS/PERMA CATHETER INSERTION N/A 02/15/2023   Procedure: DIALYSIS/PERMA CATHETER INSERTION;  Surgeon: Annice Needy, MD;  Location: [facility];  Service: Cardiovascular;  Laterality: N/A;   DIALYSIS/PERMA CATHETER INSERTION N/A 04/09/2023   Procedure: DIALYSIS/PERMA CATHETER INSERTION;  Surgeon: Renford Dills, MD;  Location: [facility];  Service: Cardiovascular;  Laterality: N/A;   DIALYSIS/PERMA CATHETER REPAIR Right 09/24/2023   Procedure: DIALYSIS/PERMA CATHETER REPAIR;  Surgeon: Renford Dills, MD;  Location: [facility];  Service: Cardiovascular;  Laterality: Right;   ENDARTERECTOMY FEMORAL Left 07/01/2015   Procedure: SUPERFICIAL FEMORAL ENDARTERECTOMY ;  Surgeon: Annice Needy, MD;  Location: [facility];  Service: Vascular;  Laterality: Left;   EXCHANGE OF A DIALYSIS CATHETER   02/11/2015   Procedure: Exchange Of A Dialysis Catheter;  Surgeon: Annice Needy, MD;  Location: [facility] IN[facility] CV [facility];  Service: Cardiovascular;;   LEG AMPUTATION ABOVE KNEE Right    PERIPHERAL VASCULAR CATHETERIZATION N/A 02/11/2015   Procedure: Dialysis/Perma Catheter Insertion;  Surgeon: Annice Needy, MD;  Location: [facility];  Service: Cardiovascular;  Laterality: N/A;   PERIPHERAL VASCULAR CATHETERIZATION N/A 05/11/2015   Procedure: Dialysis/Perma Catheter Insertion, exchange;  Surgeon: Renford Dills, MD;  Location: [facility];  Service: Cardiovascular;  Laterality: N/A;   PERIPHERAL VASCULAR CATHETERIZATION N/A 05/18/2015   Procedure: Dialysis/Perma Catheter Insertion;  Surgeon: Renford Dills, MD;  Location: [facility];  Service: Cardiovascular;  Laterality: N/A;   PERIPHERAL VASCULAR CATHETERIZATION Left 07/19/2015   Procedure: A/V Shuntogram/Fistulagram;  Surgeon: Annice Needy, MD;  Location: [facility];  Service: Cardiovascular;  Laterality: Left;   PERIPHERAL VASCULAR CATHETERIZATION N/A 07/19/2015   Procedure: A/V Shunt Intervention;  Surgeon: Annice Needy, MD;  Location: [facility];  Service: Cardiovascular;  Laterality: N/A;   PERIPHERAL VASCULAR CATHETERIZATION N/A 08/26/2015   Procedure: Dialysis/Perma Catheter Removal;  Surgeon: Annice Needy, MD;  Location: [facility];  Service: Cardiovascular;  Laterality: N/A;  PERIPHERAL VASCULAR THROMBECTOMY N/A 12/29/2016   Procedure: Peripheral Vascular Thrombectomy;  Surgeon: Renford Dills, MD;  Location: [facility];  Service: Cardiovascular;  Laterality: N/A;       Inpatient Medications: Scheduled Meds:  Chlorhexidine Gluconate Cloth  6 each Topical Q0600   insulin aspart  0-9 Units Subcutaneous TID WC   midodrine  5 mg Oral TID WC   tiZANidine  2 mg Oral BID   Continuous Infusions:  ceFEPime (MAXIPIME) IV Stopped (10/02/23 2239)   heparin     norepinephrine  (LEVOPHED) Adult infusion 1 mcg/min (10/03/23 1004)   PRN Meds: acetaminophen, gabapentin, Gerhardt's butt cream, ondansetron **OR** ondansetron (ZOFRAN) IV  Allergies:    Allergies  Allergen Reactions   Ancef [Cefazolin] Palpitations   Contrast Media [Iodinated Contrast Media] Palpitations    Social History:   Social History   Socioeconomic History   Marital status: Single    Spouse name: Not on file   Number of children: Not on file   Years of education: Not on file   Highest education level: Not on file  Occupational History   Not on file  Tobacco Use   Smoking status: Never   Smokeless tobacco: Never  Vaping Use   Vaping status: Never Used  Substance and Sexual Activity   Alcohol use: No   Drug use: No   Sexual activity: Not on file  Other Topics Concern   Not on file  Social History Narrative   Elmer Sow, friend, lives with and have been together 33 years.   Social Drivers of Corporate investment banker Strain: Not on file  Food Insecurity: No Food Insecurity (10/01/2023)   Hunger Vital Sign    Worried About Running Out of Food in the Last Year: Never true    Ran Out of Food in the Last Year: Never true  Transportation Needs: No Transportation Needs (10/01/2023)   PRAPARE - Administrator, Civil Service (Medical): No    Lack of Transportation (Non-Medical): No  Physical Activity: Not on file  Stress: Not on file  Social Connections: Moderately Isolated (10/01/2023)   Social Connection and Isolation Panel [NHANES]    Frequency of Communication with Friends and Family: More than three times a week    Frequency of Social Gatherings with Friends and Family: More than three times a week    Attends Religious Services: Never    Database administrator or Organizations: No    Attends Banker Meetings: Never    Marital Status: Living with partner  Intimate Partner Violence: Not At Risk (10/01/2023)   Humiliation, Afraid, Rape, and Kick  questionnaire    Fear of Current or Ex-Partner: No    Emotionally Abused: No    Physically Abused: No    Sexually Abused: No    Family History:    Family History  Problem Relation Age of Onset   Breast cancer Neg Hx      ROS:  Please see the history of present illness.   Physical Exam/Data:   Vitals:   10/03/23 0915 10/03/23 0930 10/03/23 0945 10/03/23 1000  BP: 138/63 136/61 124/78 (!) 112/49  Pulse: (!) 59 62 (!) 40 60  Resp: 20 19 (!) 9 13  Temp:      TempSrc:      SpO2: 100% 100% 100% 100%  Weight:        Intake/Output Summary (Last 24 hours) at 10/03/2023 1055 Last data filed at 10/03/2023 1029  Gross per 24 hour  Intake 648.52 ml  Output 1500 ml  Net -851.48 ml      10/02/2023    5:55 PM 10/02/2023    5:11 AM 10/01/2023    4:33 PM  Last 3 Weights  Weight (lbs) 220 lb 3.8 oz 220 lb 3.8 oz 230 lb 2.6 oz  Weight (kg) 99.9 kg 99.9 kg 104.4 kg     Body mass index is 47.66 kg/m.  General:  Well nourished, well developed, in no acute distress HEENT: normal Neck: no JVD Vascular: No carotid bruits; Distal pulses 2+ bilaterally Cardiac:  normal S1, S2; RRR; no murmur  Lungs:  clear to auscultation bilaterally, no wheezing, rhonchi or rales  Abd: soft, nontender, no hepatomegaly  Ext: Bilateral AKA, no edema Skin: warm and dry  Psych:  Normal affect   EKG:  The EKG was personally reviewed and demonstrates:  4/16 0134- Sinus rhythm with frequent PACs, first degree AV block, and nonspecific ST/T abnormalities in inferior leads, rate 102 bpm 4/16 0411- Sinus rhythm with first degree AV block, diffuse TWI, rate 70 bpm Telemetry:  Telemetry was personally reviewed and demonstrates: sinus rhythm with frequent PACs, ~0100 had a run of atrial tachycardia rate 110-120 bpm with frequent PACs, now showing sinus bradycardia with frequent PACs, rate 35-45 bpm  Relevant CV Studies:  10/02/2023 Echo complete 1. Left ventricular ejection fraction, by estimation, is >55%. The  left  ventricle has normal function. Left ventricular endocardial border not  optimally defined to evaluate regional wall motion. There is mild left  ventricular hypertrophy. Left  ventricular diastolic parameters are indeterminate.   2. Right ventricular systolic function is low normal. The right  ventricular size is normal.   3. The mitral valve is normal in structure. No evidence of mitral valve  regurgitation. No evidence of mitral stenosis.   4. The aortic valve is tricuspid. There is moderate thickening of the  aortic valve. Aortic valve regurgitation is not visualized. Aortic valve  sclerosis is present, with no evidence of aortic valve stenosis.   Laboratory Data:  High Sensitivity Troponin:   Recent Labs  Lab 10/03/23 0222 10/03/23 0421 10/03/23 0935  TROPONINIHS 6,721* 6,948* 5,122*     Chemistry Recent Labs  Lab 10/01/23 0912 10/02/23 0600 10/03/23 0222  NA 138 133* 133*  K 3.0* 3.5 3.3*  CL 101 98 97*  CO2 23 24 21*  GLUCOSE 180* 147* 148*  BUN 31* 23 32*  CREATININE 7.42* 5.13* 6.23*  CALCIUM 8.6* 8.6* 8.3*  MG  --   --  1.9  GFRNONAA 5* 8* 7*  ANIONGAP 14 11 15     Recent Labs  Lab 10/01/23 0912 10/02/23 0600  PROT 6.7 6.3*  ALBUMIN 3.1* 2.9*  AST 25 81*  ALT 7 16  ALKPHOS 70 68  BILITOT 0.9 1.0   Lipids No results for input(s): "CHOL", "TRIG", "HDL", "LABVLDL", "LDLCALC", "CHOLHDL" in the last 168 hours.  Hematology Recent Labs  Lab 10/01/23 0912 10/02/23 0600 10/03/23 0222  WBC 12.3* 8.1 5.2  RBC 4.42 4.30 4.11  HGB 11.6* 11.2* 10.9*  HCT 35.9* 34.2* 32.4*  MCV 81.2 79.5* 78.8*  MCH 26.2 26.0 26.5  MCHC 32.3 32.7 33.6  RDW 17.8* 17.3* 17.5*  PLT 105* 108* 107*   Thyroid No results for input(s): "TSH", "FREET4" in the last 168 hours.  BNP Recent Labs  Lab 10/01/23 0912  BNP 1,436.1*    DDimer No results for input(s): "DDIMER" in the last 168 hours.  Radiology/Studies:  DG Chest Port 1 View Result Date:  10/01/2023 IMPRESSION: Lower lung volumes with probable vascular congestion and mildly increased left basilar atelectasis. No definite evidence of pneumonia. Electronically Signed   By: Elmon Hagedorn M.D.   On: 10/01/2023 10:12   Assessment and Plan:   Elevated troponin - Nurse noted ST segment changes on telemetry early this morning 4/15, IM doctor reviewed and felt EKG to be unchanged from prior - EKG at that time and repeat this morning show nonspecific ST/T abnormalities - Troponin [phone] - Patient denies chest pain - Suspect demand ischemia in the setting of septic shock, but cannot exclude ACS - Echo this admission showed EF >55%, unable to evaluate regional wall motion abnormalities - Given risk factors including T2DM, HLD, and PVD and significantly elevated troponin, recommend proceeding with cardiac catheterization. Vitals are unstable at this time with significant bradycardia and hypotension. Will tentatively plan for procedure tomorrow afternoon pending stabilization of vitals.  - Continue IV heparin - Will check lipid panel, continue atorvastatin 40 mg daily once LFTs are stable  Septic shock Serratia bacteremia likely secondary to HD catheter - S/p HD catheter removal 4/15 - Weaned off pressors, continued on midodrine - BP and HR remain low, tizanadine held as likely contributing - Management per CCM and ID  ESRD on HD - Tentative plan for new HD catheter placement on 4/18 - HD per nephrology  Acute hypoxic respiratory failure - Improved, no longer requiring supplemental O2  T2DM - A1C 6.5 - Management per CCM  For questions or updates, please contact [facility] Please consult [email or web address] for contact info under    Signed, Brodie Cannon, PA-C  10/03/2023 10:55 AM

## 2023-10-03 NOTE — Progress Notes (Addendum)
       CROSS COVER NOTE   NAME: Ayame G Hapke MRN: [id number] DOB : 16-Feb-1951   Significant event : Concern for ST elevation   Concern as stated by nurse / staff   "This patient was admitted in ICU from the floor yesterday with AMS, vascular congestion. Ultimately requiring dialysis and levophed. Aaron AasAaron AasAaron AasI just noticed ST elevation on inferior leads ... Patient has no chest pain".   "I asked the RT to put this pt on BIPAP as she looked drowsy but she follows commands."   Pertinent findings on chart review: # Sepsis, Serratia bacteremia Acute respiratory failure with hypoxia (HCC) PNA Acute metabolic encephalopathy ESRD on dialysis (HCC) Hypotension on vasepressors    Patient assessment and exam Patient resting comfortably, somnolent , arouses easily but readily falls back asleep. Denies chest pain     10/03/2023    1:00 AM 10/03/2023   12:30 AM 10/03/2023   12:00 AM  Vitals with BMI  Systolic 128  92  Diastolic 109  69  Pulse 84 92 84   Physical Exam Vitals and nursing note reviewed.  Constitutional:      General: She is not in acute distress.    Comments: BiPAP mask on  HENT:     Head: Normocephalic and atraumatic.  Cardiovascular:     Rate and Rhythm: Normal rate and regular rhythm.     Heart sounds: Normal heart sounds.  Pulmonary:     Effort: Pulmonary effort is normal.     Breath sounds: Normal breath sounds.  Abdominal:     Palpations: Abdomen is soft.     Tenderness: There is no abdominal tenderness.  Neurological:     Mental Status: Mental status is at baseline.      workup  Cardiac Panel (last 3 results) Recent Labs    10/03/23 0222 10/03/23 0421  TROPONINIHS 6,721* 6,948*       Assessment and  Interventions   Assessment:  Nursing Concern for ST elevation, no chest pain --EKG unchanged from prior on 10/01/23 Elevated troponin in the setting of ESRD new to dialysis Respiratory failure , now on BiPAP   Plan:  Echo to evaluate for WMA  given  elevation in troponin Continue to trend troponins : small delta 6721-->6948 Consider cardiology consult if significant delta Plan discussed with bedside nurse as well as ICU charge    5:32am reassessment: -->remains comfortable    CRITICAL CARE Performed by: Lanetta Pion   Total critical care time: 120 minutes  Critical care time was exclusive of separately billable procedures and treating other patients.  Critical care was necessary to treat or prevent imminent or life-threatening deterioration.  Critical care was time spent personally by me on the following activities: development of treatment plan with patient and/or surrogate as well as nursing, discussions with consultants, evaluation of patient's response to treatment, examination of patient, obtaining history from patient or surrogate, ordering and performing treatments and interventions, ordering and review of laboratory studies, ordering and review of radiographic studies, pulse oximetry and re-evaluation of patient's condition.

## 2023-10-03 NOTE — Consult Note (Addendum)
 NAME:  Amy Wu, MRN:  [id number], DOB:  1950-11-30, LOS: 1 ADMISSION DATE:  10/01/2023, CONSULTATION DATE: 10/03/2023 REFERRING MD: Dr. Denton Lank, CHIEF COMPLAINT: Hypotension    History of Present Illness:  This is a 73 yo female who presented to [facility] ER on 04/14 from home via EMS with altered mental status.  Per ER notes EMS reported the pt was febrile temp 103 degrees F, therefore they administered tylenol and a 500 ml NS bolus.  She was also hypoxic with O2 sats in 70-80% on room air.  She was placed on 6L O2 via nasal canula.  ED Course  Upon arrival to the ER CXR concerning for vascular congestion and left basilar atelectasis.  Significant lab results were: K+ 3.0/glucose 180/BUN 31/creatinine 7.42/calcium 8.6/albumin 3.1/BNP 1,436.1/wbc 12.3/hgb 11.6/platelets 105.  COVID/Influenza A&B/RSV negative.  Sepsis protocol initiated and pt received: vancomycin and levaquin.  Pt subsequently admitted to the progressive care unit per hospitalist team for additional workup and treatment.  See detailed hospital course below under significant events below.    Pertinent  Medical History  Arthritis  ESRD on HD  Type II Diabetes Mellitus  HLD PVD  Micro Data:   Resp panel by RT-PCR 04/14>>negative  Blood x2 04/14>>serratia marcescens  RVP 04/14>>negative  MRSA PCR 04/14>>negative  Dialysis cath tip 04/15>>  Anti-infectives (From admission, onward)    Start     Dose/Rate Route Frequency Ordered Stop   10/03/23 1000  levofloxacin (LEVAQUIN) IVPB 500 mg  Status:  Discontinued        500 mg 100 mL/hr over 60 Minutes Intravenous Every 48 hours 10/01/23 1445 10/01/23 1458   10/03/23 1000  cefTRIAXone (ROCEPHIN) 2 g in sodium chloride 0.9 % 100 mL IVPB  Status:  Discontinued        2 g 200 mL/hr over 30 Minutes Intravenous Every 24 hours 10/01/23 1459 10/02/23 0550   10/02/23 2200  ceFEPIme (MAXIPIME) 1 g in sodium chloride 0.9 % 100 mL IVPB       Placed in "Followed by" Linked Group   1  g 200 mL/hr over 30 Minutes Intravenous Every 24 hours 10/02/23 0550     10/02/23 1000  azithromycin (ZITHROMAX) 500 mg in sodium chloride 0.9 % 250 mL IVPB  Status:  Discontinued        500 mg 250 mL/hr over 60 Minutes Intravenous Daily 10/01/23 1459 10/02/23 1553   10/02/23 0645  ceFEPIme (MAXIPIME) 1 g in sodium chloride 0.9 % 100 mL IVPB       Placed in "Followed by" Linked Group   1 g 200 mL/hr over 30 Minutes Intravenous  Once 10/02/23 0550 10/02/23 0717   10/01/23 0915  vancomycin (VANCOCIN) IVPB 1000 mg/200 mL premix        1,000 mg 200 mL/hr over 60 Minutes Intravenous  Once 10/01/23 0913 10/01/23 1058   10/01/23 0915  levofloxacin (LEVAQUIN) IVPB 750 mg        750 mg 100 mL/hr over 90 Minutes Intravenous  Once 10/01/23 0913 10/01/23 1807      Significant Hospital Events: Including procedures, antibiotic start and stop dates in addition to other pertinent events   04/14: Pt admitted to the progressive care unit with sepsis  04/15: Pt transferred to the stepdown unit. HD catheter removed due to likely source of sepsis   04/16: Overnight pt became hypotensive requiring peripheral levophed gtt.  PCCM team consulted to assist with management   Interim History / Subjective:  As outlined above  under significant events   Objective   Blood pressure 104/87, pulse 62, temperature 98 F (36.7 C), temperature source Axillary, resp. rate 20, weight 99.9 kg, SpO2 100%.    FiO2 (%):  [30 %-40 %] 30 %   Intake/Output Summary (Last 24 hours) at 10/03/2023 5809 Last data filed at 10/03/2023 0700 Gross per 24 hour  Intake 516.89 ml  Output 1500 ml  Net -983.11 ml   Filed Weights   10/01/23 1633 10/02/23 0511 10/02/23 1755  Weight: 104.4 kg 99.9 kg 99.9 kg   Examination: General: Acutely-ill appearing female, NAD on 3L O2 via nasal canula  HENT: Supple, no JVD  Lungs: Faint rhonchi throughout, even, non labored  Cardiovascular: Irregular irregular, no m/r/g, 2+ radial/1+ femoral  pulses, no edema  Abdomen: +BS x4, obese, soft, non distended, non tender  Extremities: Right AKA and left BKA Neuro: Alert and oriented, following commands, PERRLA  GU: Anuric  Resolved Hospital Problem list     Assessment & Plan:   #Acute metabolic encephalopathy  - Correct metabolic derangements  - Avoid sedating medications as able  - Maintain sleep/wake cycle   #Septic shock #Elevated troponin secondary NSTEMI vs. demand ischemia  Hx: HLD and PVD Echo 10/02/23: EF >55%; mild tricuspid valve regurgitation; trivial pulmonic valve regurgitation  - Continuous telemetry monitoring  - Trend troponin's until peaked  - Will start heparin gtt - Cardiology consulted appreciate input  - Prn levophed gtt to maintain map 65 or higher  - Will start midodrine 5 mg tid  - Resume outpatient aspirin and atorvastatin   #Acute hypoxic respiratory failure  #Vascular congestion  - Supplemental O2 for dyspnea and/or hypoxia  - Maintain O2 sats 92% or higher  - Fluid removal with HD   #ESRD on HD (M-W-F) #Hyponatremia - Trend BMP  - Replace electrolytes as indicated  - Nephrology consulted appreciate input - Vascular surgery consulted appreciate input: line holiday with tentative plan for new HD catheter placement on 10/05/23  #Elevated AST  - Trend hepatic function panel  - Avoid hepatotoxic agents as able   #Serratia bacteremia likely secondary to HD catheter: HD catheter removed 04/15 - Trend WBC and monitor fever curve  - ID consulted appreciate input: continue cefepime for now if continued encephalopathy will change to carbapenem per ID recommendations   #Type II diabetes mellitus  - CBG's ac/hs - SSI  - Target CBG's 140 to 180  - Follow hypo/hyperglycemic protocol   Best Practice (right click and "Reselect all SmartList Selections" daily)   Diet/type: Regular consistency (see orders) DVT prophylaxis systemic heparin Pressure ulcer(s): N/A GI prophylaxis: N/A Lines:  N/A Foley:  N/A Code Status:  DNR Last date of multidisciplinary goals of care discussion [N/A]  04/16: Updated pt at bedside regarding pts condition and current plan of care all questions were answered  Labs   CBC: Recent Labs  Lab 10/01/23 0912 10/02/23 0600 10/03/23 0222  WBC 12.3* 8.1 5.2  NEUTROABS 10.7*  --   --   HGB 11.6* 11.2* 10.9*  HCT 35.9* 34.2* 32.4*  MCV 81.2 79.5* 78.8*  PLT 105* 108* 107*    Basic Metabolic Panel: Recent Labs  Lab 10/01/23 0912 10/02/23 0600 10/03/23 0222  NA 138 133* 133*  K 3.0* 3.5 3.3*  CL 101 98 97*  CO2 23 24 21*  GLUCOSE 180* 147* 148*  BUN 31* 23 32*  CREATININE 7.42* 5.13* 6.23*  CALCIUM 8.6* 8.6* 8.3*  MG  --   --  1.9  PHOS  --   --  4.0   GFR: Estimated Creatinine Clearance: 8.1 mL/min (A) (by C-G formula based on SCr of 6.23 mg/dL (H)). Recent Labs  Lab 10/01/23 0912 10/01/23 2221 10/02/23 0600 10/03/23 0222  WBC 12.3*  --  8.1 5.2  LATICACIDVEN 1.9 1.5  --   --     Liver Function Tests: Recent Labs  Lab 10/01/23 0912 10/02/23 0600  AST 25 81*  ALT 7 16  ALKPHOS 70 68  BILITOT 0.9 1.0  PROT 6.7 6.3*  ALBUMIN 3.1* 2.9*   No results for input(s): "LIPASE", "AMYLASE" in the last 168 hours. Recent Labs  Lab 10/01/23 2221  AMMONIA 12    ABG    Component Value Date/Time   PHART 7.45 05/24/2021 0236   PCO2ART 45 05/24/2021 0236   HCO3 24.3 10/02/2023 1330   ACIDBASEDEF 0.8 10/02/2023 1330   O2SAT 84.5 10/02/2023 1330     Coagulation Profile: Recent Labs  Lab 10/01/23 0912  INR 1.4*    Cardiac Enzymes: No results for input(s): "CKTOTAL", "CKMB", "CKMBINDEX", "TROPONINI" in the last 168 hours.  HbA1C: Hgb A1c MFr Bld  Date/Time Value Ref Range Status  10/01/2023 10:21 PM 6.5 (H) 4.8 - 5.6 % Final    Comment:    (NOTE) Pre diabetes:          5.7%-6.4%  Diabetes:              >6.4%  Glycemic control for   <7.0% adults with diabetes   05/23/2021 07:29 AM 7.3 (H) 4.8 - 5.6 % Final     Comment:    (NOTE)         Prediabetes: 5.7 - 6.4         Diabetes: >6.4         Glycemic control for adults with diabetes: <7.0     CBG: Recent Labs  Lab 10/02/23 1210 10/02/23 1650 10/02/23 1715 10/02/23 2128 10/03/23 0743  GLUCAP 139* 147* 140* 121* 183*    Review of Systems: Positives in BOLD   Gen: Denies fever, chills, weight change, fatigue, night sweats HEENT: Denies blurred vision, double vision, hearing loss, tinnitus, sinus congestion, rhinorrhea, sore throat, neck stiffness, dysphagia PULM: Denies shortness of breath, cough, sputum production, hemoptysis, wheezing CV: Denies chest pain, edema, orthopnea, paroxysmal nocturnal dyspnea, palpitations GI: Denies abdominal pain, nausea, vomiting, diarrhea, hematochezia, melena, constipation, change in bowel habits GU: Denies dysuria, hematuria, polyuria, oliguria, urethral discharge Endocrine: Denies hot or cold intolerance, polyuria, polyphagia or appetite change Derm: Denies rash, dry skin, scaling or peeling skin change Heme: Denies easy bruising, bleeding, bleeding gums Neuro: Denies headache, numbness, weakness, slurred speech, loss of memory or consciousness  Past Medical History:  She,  has a past medical history of Arthritis, Chronic kidney disease, Complication of anesthesia, Diabetes mellitus without complication (HCC), Hyperlipidemia, and Peripheral vascular disease (HCC).   Surgical History:   Past Surgical History:  Procedure Laterality Date   A/V SHUNT INTERVENTION Left 11/20/2019   Procedure: A/V SHUNT INTERVENTION (Left Thigh);  Surgeon: Annice Needy, MD;  Location: [facility];  Service: Cardiovascular;  Laterality: Left;   A/V SHUNTOGRAM Left 02/16/2017   Procedure: A/V Shuntogram;  Surgeon: Annice Needy, MD;  Location: [facility];  Service: Cardiovascular;  Laterality: Left;   A/V SHUNTOGRAM Left 12/13/2017   Procedure: A/V SHUNTOGRAM;  Surgeon: Annice Needy, MD;  Location: [facility];  Service: Cardiovascular;  Laterality: Left;  A/V SHUNTOGRAM Left 12/05/2018   Procedure: A/V SHUNTOGRAM;  Surgeon: Annice Needy, MD;  Location: [facility];  Service: Cardiovascular;  Laterality: Left;   A/V SHUNTOGRAM Left 06/03/2020   Procedure: A/V SHUNTOGRAM;  Surgeon: Annice Needy, MD;  Location: [facility];  Service: Cardiovascular;  Laterality: Left;   A/V SHUNTOGRAM Left 05/24/2021   Procedure: A/V SHUNTOGRAM;  Surgeon: Renford Dills, MD;  Location: [facility];  Service: Cardiovascular;  Laterality: Left;  Thigh Graft   ABOVE KNEE LEG AMPUTATION     AV FISTULA PLACEMENT Bilateral    AV FISTULA PLACEMENT Left 07/01/2015   Procedure: INSERTION OF ARTERIOVENOUS (AV) GRAFT THIGH (ARTEGRAFT);  Surgeon: Annice Needy, MD;  Location: [facility];  Service: Vascular;  Laterality: Left;   BELOW KNEE LEG AMPUTATION Left    BREAST BIOPSY     CATARACT EXTRACTION W/PHACO Left 07/10/2022   Procedure: CATARACT EXTRACTION PHACO AND INTRAOCULAR LENS PLACEMENT (IOC) LEFT DIABETIC  12.98  01:13.7;  Surgeon: Nevada Crane, MD;  Location: [facility];  Service: Ophthalmology;  Laterality: Left;   DIALYSIS/PERMA CATHETER INSERTION N/A 05/26/2021   Procedure: DIALYSIS/PERMA CATHETER INSERTION;  Surgeon: Annice Needy, MD;  Location: [facility];  Service: Cardiovascular;  Laterality: N/A;   DIALYSIS/PERMA CATHETER INSERTION N/A 07/05/2022   Procedure: DIALYSIS/PERMA CATHETER INSERTION;  Surgeon: Annice Needy, MD;  Location: [facility];  Service: Cardiovascular;  Laterality: N/A;   DIALYSIS/PERMA CATHETER INSERTION N/A 07/28/2022   Procedure: DIALYSIS/PERMA CATHETER INSERTION;  Surgeon: Annice Needy, MD;  Location: [facility];  Service: Cardiovascular;  Laterality: N/A;   DIALYSIS/PERMA CATHETER INSERTION N/A 09/21/2022   Procedure: DIALYSIS/PERMA CATHETER INSERTION;  Surgeon: Annice Needy, MD;  Location: [facility];   Service: Cardiovascular;  Laterality: N/A;   DIALYSIS/PERMA CATHETER INSERTION N/A 02/15/2023   Procedure: DIALYSIS/PERMA CATHETER INSERTION;  Surgeon: Annice Needy, MD;  Location: [facility];  Service: Cardiovascular;  Laterality: N/A;   DIALYSIS/PERMA CATHETER INSERTION N/A 04/09/2023   Procedure: DIALYSIS/PERMA CATHETER INSERTION;  Surgeon: Renford Dills, MD;  Location: [facility];  Service: Cardiovascular;  Laterality: N/A;   DIALYSIS/PERMA CATHETER REPAIR Right 09/24/2023   Procedure: DIALYSIS/PERMA CATHETER REPAIR;  Surgeon: Renford Dills, MD;  Location: [facility];  Service: Cardiovascular;  Laterality: Right;   ENDARTERECTOMY FEMORAL Left 07/01/2015   Procedure: SUPERFICIAL FEMORAL ENDARTERECTOMY ;  Surgeon: Annice Needy, MD;  Location: [facility];  Service: Vascular;  Laterality: Left;   EXCHANGE OF A DIALYSIS CATHETER  02/11/2015   Procedure: Exchange Of A Dialysis Catheter;  Surgeon: Annice Needy, MD;  Location: [facility];  Service: Cardiovascular;;   LEG AMPUTATION ABOVE KNEE Right    PERIPHERAL VASCULAR CATHETERIZATION N/A 02/11/2015   Procedure: Dialysis/Perma Catheter Insertion;  Surgeon: Annice Needy, MD;  Location: [facility];  Service: Cardiovascular;  Laterality: N/A;   PERIPHERAL VASCULAR CATHETERIZATION N/A 05/11/2015   Procedure: Dialysis/Perma Catheter Insertion, exchange;  Surgeon: Renford Dills, MD;  Location: [facility];  Service: Cardiovascular;  Laterality: N/A;   PERIPHERAL VASCULAR CATHETERIZATION N/A 05/18/2015   Procedure: Dialysis/Perma Catheter Insertion;  Surgeon: Renford Dills, MD;  Location: [facility];  Service: Cardiovascular;  Laterality: N/A;   PERIPHERAL VASCULAR CATHETERIZATION Left 07/19/2015   Procedure: A/V Shuntogram/Fistulagram;  Surgeon: Annice Needy, MD;  Location: [facility];  Service: Cardiovascular;  Laterality: Left;   PERIPHERAL VASCULAR CATHETERIZATION N/A  07/19/2015   Procedure: A/V Shunt Intervention;  Surgeon: Celso College, MD;  Location: [facility];  Service: Cardiovascular;  Laterality: N/A;   PERIPHERAL VASCULAR CATHETERIZATION N/A 08/26/2015   Procedure: Dialysis/Perma Catheter Removal;  Surgeon: Celso College, MD;  Location: [facility];  Service: Cardiovascular;  Laterality: N/A;   PERIPHERAL VASCULAR THROMBECTOMY N/A 12/29/2016   Procedure: Peripheral Vascular Thrombectomy;  Surgeon: Jackquelyn Mass, MD;  Location: [facility];  Service: Cardiovascular;  Laterality: N/A;     Social History:   reports that she has never smoked. She has never used smokeless tobacco. She reports that she does not drink alcohol and does not use drugs.   Family History:  Her family history is negative for Breast cancer.   Allergies Allergies  Allergen Reactions   Ancef [Cefazolin] Palpitations   Contrast Media [Iodinated Contrast Media] Palpitations     Home Medications  Prior to Admission medications   Medication Sig Start Date End Date Taking? Authorizing Provider  acetaminophen (TYLENOL) 500 MG tablet Take 1,000 mg by mouth every 8 (eight) hours as needed for moderate pain.   Yes [provider]  aspirin EC 81 MG tablet Take 81 mg by mouth daily. 11/11/20  Yes [provider]  atorvastatin (LIPITOR) 40 MG tablet Take 40 mg by mouth daily.   Yes [provider]  fluticasone (FLONASE) 50 MCG/ACT nasal spray Place 1 spray into both nostrils daily.   Yes [provider]  gabapentin (NEURONTIN) 100 MG capsule Take 100 mg by mouth 3 (three) times daily as needed. M/W/F after dialysis PRN for leg/thigh cramps   Yes [provider]  glucose 4 GM chewable tablet Chew 1 tablet by mouth as needed for low blood sugar. If blood sugar less than 70. Chew 3 tablets and recheck blood sugar in 15 minutes   Yes [provider]  JANUVIA 25 MG tablet Take 25 mg by mouth daily. 11/02/20  Yes  [provider]  lanthanum (FOSRENOL) 1000 MG chewable tablet Chew 1,000 mg by mouth 2 (two) times daily with a meal.    Yes [provider]  LOKELMA 10 g PACK packet Take 1 packet by mouth as directed. Taken on Sat and Sundays midday 09/25/23  Yes [provider]  loratadine (CLARITIN) 10 MG tablet Take 10 mg by mouth daily as needed for allergies.   Yes [provider]  Multiple Vitamins-Minerals (PRESERVISION AREDS 2) CAPS Take 1 capsule by mouth 2 (two) times daily. 09/18/23  Yes [provider]  senna-docusate (SENOKOT-S) 8.6-50 MG tablet Take 1-2 tablets by mouth at bedtime as needed for mild constipation or moderate constipation. 1-2 tablets at bedtime if needed   Yes [provider]  STOOL SOFTENER 100 MG capsule Take 100 mg by mouth 2 (two) times daily. 09/18/23  Yes [provider]  tiZANidine (ZANAFLEX) 4 MG tablet Take 4 mg by mouth 2 (two) times daily as needed for muscle spasms. 09/25/23  Yes [provider]  calcitRIOL (ROCALTROL) 0.25 MCG capsule Take 0.25 mcg by mouth daily. Take 3 capsules by mouth three times week at dialysis    [provider]  cinacalcet (SENSIPAR) 30 MG tablet Take 30 mg by mouth daily with supper.     [provider]  COMFORT EZ PEN NEEDLES 31G X 6 MM MISC Inject into the skin. 03/17/21   [provider]  ferrous sulfate  325 (65 FE) MG tablet Take 325 mg by mouth every other day. Patient not taking: Reported on 02/15/2023    [provider]  glucose blood test strip 1 each by Other route as needed for other. Use as instructed    [provider]  ketotifen (ZADITOR) 0.025 % ophthalmic solution Place 1 drop into both eyes 2 (two) times daily. Patient not taking: Reported on 04/09/2023    [provider]  ReliOn Lancet Devices 30G MISC Apply topically. Patient not taking: Reported on 06/30/2022 03/17/21   [provider]  TRUEplus Lancets  28G MISC Apply topically. 03/17/21   [provider]  Zebedee Hibbs INSULIN SYRINGE 31G X 5/16" 0.3 ML MISC SMARTSIG:Injection Daily 03/31/21   [provider]     Critical care time: 65 minutes     Janey Meek, AGNP  Pulmonary/Critical Care Pager [phone] (please enter 7 digits) PCCM Consult Pager [phone] (please enter 7 digits)

## 2023-10-03 NOTE — Consult Note (Signed)
 Cardiology Consultation   Patient ID: [name] MRN: [id number]; DOB: 28-Feb-1951  Admit date: 10/01/2023 Date of Consult: 10/03/2023  PCP:  Clovis Pu, Arturo Morton, DO   [facility] Providers Cardiologist:  None        Patient Profile:   Amy Wu is a 73 y.o. female with a hx of ESRD on HD, T2DM, hyperlipidemia, and PVD who is being seen 10/03/2023 for the evaluation of elevated troponin at the request of NP Zada Girt.  History of Present Illness:   Amy Wu has no known history of heart disease and has not seen a cardiologist in the past. Her friend whom she lives with is at the bedside and assists with history as patient is drowsy from receiving muscle relaxer this morning. Patient reports several day history of not feeling herself. She reports increased somnolence and overall not feeling well. She denies chest pain, palpitations, shortness of breath, lower extremity swelling, orthopnea, bleeding, nausea, vomiting, and PND. Her friend notes that she was having a hard time keeping conversation due to somnolence. He was concerned and called EMS on 4/14. EMS found the patient to be febrile at home and gave Tylenol and normal saline bolus. O2 saturation was found to be in the high 70s to low 80s on room air. Patient was placed on 6 L nasal cannula. Upon arrival to the ED BP 114/51, meeting sepsis criteria with temperature max of 103 prior to arrival, respiration rate 20s, WBC 12.2. CBC with hemoglobin 11.6, platelets 105.  BMET with creatinine 7.42, glucose 180, and K 3. Lactate normal. BNP 1436. Respiratory panel negative.  hest x-ray shows left basilar atelectasis and vascular congestion. Blood culture was drawn and showed Serratia growing. ID was consulted and recommended removing dialysis catheter and sending tip for culture. Started on IV antibiotics. She did undergo dialysis 4/15. Vascular catheter was removed 4/15. In the early hours of 4/16, nurse noted ST elevation on  telemetry. EKG was done and felt to be similar to prior tracings per IM doctor. Nurse also noted BP dropped to 80 systolic and patient was started on Levophed and placed on BiPAP. Patient denied chest pain during this time. Troponin was drawn [phone]. Cardiology was asked to consult for elevated troponin.  Past Medical History:  Diagnosis Date   Arthritis    Chronic kidney disease    Complication of anesthesia    Diabetes mellitus without complication (HCC)    Hyperlipidemia    Peripheral vascular disease (HCC)     Past Surgical History:  Procedure Laterality Date   A/V SHUNT INTERVENTION Left 11/20/2019   Procedure: A/V SHUNT INTERVENTION (Left Thigh);  Surgeon: Annice Needy, MD;  Location: [facility];  Service: Cardiovascular;  Laterality: Left;   A/V SHUNTOGRAM Left 02/16/2017   Procedure: A/V Shuntogram;  Surgeon: Annice Needy, MD;  Location: [facility];  Service: Cardiovascular;  Laterality: Left;   A/V SHUNTOGRAM Left 12/13/2017   Procedure: A/V SHUNTOGRAM;  Surgeon: Annice Needy, MD;  Location: [facility];  Service: Cardiovascular;  Laterality: Left;   A/V SHUNTOGRAM Left 12/05/2018   Procedure: A/V SHUNTOGRAM;  Surgeon: Annice Needy, MD;  Location: [facility];  Service: Cardiovascular;  Laterality: Left;   A/V SHUNTOGRAM Left 06/03/2020   Procedure: A/V SHUNTOGRAM;  Surgeon: Annice Needy, MD;  Location: [facility];  Service: Cardiovascular;  Laterality: Left;   A/V SHUNTOGRAM Left 05/24/2021   Procedure: A/V  SHUNTOGRAM;  Surgeon: Renford Dills, MD;  Location: [facility];  Service: Cardiovascular;  Laterality: Left;  Thigh Graft   ABOVE KNEE LEG AMPUTATION     AV FISTULA PLACEMENT Bilateral    AV FISTULA PLACEMENT Left 07/01/2015   Procedure: INSERTION OF ARTERIOVENOUS (AV) GRAFT THIGH (ARTEGRAFT);  Surgeon: Annice Needy, MD;  Location: [facility];  Service: Vascular;  Laterality: Left;   BELOW KNEE LEG AMPUTATION Left     BREAST BIOPSY     CATARACT EXTRACTION W/PHACO Left 07/10/2022   Procedure: CATARACT EXTRACTION PHACO AND INTRAOCULAR LENS PLACEMENT (IOC) LEFT DIABETIC  12.98  01:13.7;  Surgeon: Nevada Crane, MD;  Location: [facility];  Service: Ophthalmology;  Laterality: Left;   DIALYSIS/PERMA CATHETER INSERTION N/A 05/26/2021   Procedure: DIALYSIS/PERMA CATHETER INSERTION;  Surgeon: Annice Needy, MD;  Location: [facility];  Service: Cardiovascular;  Laterality: N/A;   DIALYSIS/PERMA CATHETER INSERTION N/A 07/05/2022   Procedure: DIALYSIS/PERMA CATHETER INSERTION;  Surgeon: Annice Needy, MD;  Location: [facility];  Service: Cardiovascular;  Laterality: N/A;   DIALYSIS/PERMA CATHETER INSERTION N/A 07/28/2022   Procedure: DIALYSIS/PERMA CATHETER INSERTION;  Surgeon: Annice Needy, MD;  Location: [facility];  Service: Cardiovascular;  Laterality: N/A;   DIALYSIS/PERMA CATHETER INSERTION N/A 09/21/2022   Procedure: DIALYSIS/PERMA CATHETER INSERTION;  Surgeon: Annice Needy, MD;  Location: [facility];  Service: Cardiovascular;  Laterality: N/A;   DIALYSIS/PERMA CATHETER INSERTION N/A 02/15/2023   Procedure: DIALYSIS/PERMA CATHETER INSERTION;  Surgeon: Annice Needy, MD;  Location: [facility];  Service: Cardiovascular;  Laterality: N/A;   DIALYSIS/PERMA CATHETER INSERTION N/A 04/09/2023   Procedure: DIALYSIS/PERMA CATHETER INSERTION;  Surgeon: Renford Dills, MD;  Location: [facility];  Service: Cardiovascular;  Laterality: N/A;   DIALYSIS/PERMA CATHETER REPAIR Right 09/24/2023   Procedure: DIALYSIS/PERMA CATHETER REPAIR;  Surgeon: Renford Dills, MD;  Location: [facility];  Service: Cardiovascular;  Laterality: Right;   ENDARTERECTOMY FEMORAL Left 07/01/2015   Procedure: SUPERFICIAL FEMORAL ENDARTERECTOMY ;  Surgeon: Annice Needy, MD;  Location: [facility];  Service: Vascular;  Laterality: Left;   EXCHANGE OF A DIALYSIS CATHETER   02/11/2015   Procedure: Exchange Of A Dialysis Catheter;  Surgeon: Annice Needy, MD;  Location: [facility];  Service: Cardiovascular;;   LEG AMPUTATION ABOVE KNEE Right    PERIPHERAL VASCULAR CATHETERIZATION N/A 02/11/2015   Procedure: Dialysis/Perma Catheter Insertion;  Surgeon: Annice Needy, MD;  Location: [facility];  Service: Cardiovascular;  Laterality: N/A;   PERIPHERAL VASCULAR CATHETERIZATION N/A 05/11/2015   Procedure: Dialysis/Perma Catheter Insertion, exchange;  Surgeon: Renford Dills, MD;  Location: [facility];  Service: Cardiovascular;  Laterality: N/A;   PERIPHERAL VASCULAR CATHETERIZATION N/A 05/18/2015   Procedure: Dialysis/Perma Catheter Insertion;  Surgeon: Renford Dills, MD;  Location: [facility];  Service: Cardiovascular;  Laterality: N/A;   PERIPHERAL VASCULAR CATHETERIZATION Left 07/19/2015   Procedure: A/V Shuntogram/Fistulagram;  Surgeon: Annice Needy, MD;  Location: [facility];  Service: Cardiovascular;  Laterality: Left;   PERIPHERAL VASCULAR CATHETERIZATION N/A 07/19/2015   Procedure: A/V Shunt Intervention;  Surgeon: Annice Needy, MD;  Location: [facility];  Service: Cardiovascular;  Laterality: N/A;   PERIPHERAL VASCULAR CATHETERIZATION N/A 08/26/2015   Procedure: Dialysis/Perma Catheter Removal;  Surgeon: Annice Needy, MD;  Location: [facility];  Service: Cardiovascular;  Laterality: N/A;  PERIPHERAL VASCULAR THROMBECTOMY N/A 12/29/2016   Procedure: Peripheral Vascular Thrombectomy;  Surgeon: Jackquelyn Mass, MD;  Location: [facility];  Service: Cardiovascular;  Laterality: N/A;       Inpatient Medications: Scheduled Meds:  Chlorhexidine Gluconate Cloth  6 each Topical Q0600   insulin aspart  0-9 Units Subcutaneous TID WC   midodrine  5 mg Oral TID WC   tiZANidine  2 mg Oral BID   Continuous Infusions:  ceFEPime (MAXIPIME) IV Stopped (10/02/23 2239)   heparin     norepinephrine  (LEVOPHED) Adult infusion 1 mcg/min (10/03/23 1004)   PRN Meds: acetaminophen, gabapentin, Gerhardt's butt cream, ondansetron **OR** ondansetron (ZOFRAN) IV  Allergies:    Allergies  Allergen Reactions   Ancef [Cefazolin] Palpitations   Contrast Media [Iodinated Contrast Media] Palpitations    Social History:   Social History   Socioeconomic History   Marital status: Single    Spouse name: Not on file   Number of children: Not on file   Years of education: Not on file   Highest education level: Not on file  Occupational History   Not on file  Tobacco Use   Smoking status: Never   Smokeless tobacco: Never  Vaping Use   Vaping status: Never Used  Substance and Sexual Activity   Alcohol use: No   Drug use: No   Sexual activity: Not on file  Other Topics Concern   Not on file  Social History Narrative   Lovina Ruddle, friend, lives with and have been together 33 years.   Social Drivers of Corporate investment banker Strain: Not on file  Food Insecurity: No Food Insecurity (10/01/2023)   Hunger Vital Sign    Worried About Running Out of Food in the Last Year: Never true    Ran Out of Food in the Last Year: Never true  Transportation Needs: No Transportation Needs (10/01/2023)   PRAPARE - Administrator, Civil Service (Medical): No    Lack of Transportation (Non-Medical): No  Physical Activity: Not on file  Stress: Not on file  Social Connections: Moderately Isolated (10/01/2023)   Social Connection and Isolation Panel [NHANES]    Frequency of Communication with Friends and Family: More than three times a week    Frequency of Social Gatherings with Friends and Family: More than three times a week    Attends Religious Services: Never    Database administrator or Organizations: No    Attends Banker Meetings: Never    Marital Status: Living with partner  Intimate Partner Violence: Not At Risk (10/01/2023)   Humiliation, Afraid, Rape, and Kick  questionnaire    Fear of Current or Ex-Partner: No    Emotionally Abused: No    Physically Abused: No    Sexually Abused: No    Family History:    Family History  Problem Relation Age of Onset   Breast cancer Neg Hx      ROS:  Please see the history of present illness.   Physical Exam/Data:   Vitals:   10/03/23 0915 10/03/23 0930 10/03/23 0945 10/03/23 1000  BP: 138/63 136/61 124/78 (!) 112/49  Pulse: (!) 59 62 (!) 40 60  Resp: 20 19 (!) 9 13  Temp:      TempSrc:      SpO2: 100% 100% 100% 100%  Weight:        Intake/Output Summary (Last 24 hours) at 10/03/2023 1055 Last data filed at 10/03/2023 1029  Gross per 24 hour  Intake 648.52 ml  Output 1500 ml  Net -851.48 ml      10/02/2023    5:55 PM 10/02/2023    5:11 AM 10/01/2023    4:33 PM  Last 3 Weights  Weight (lbs) 220 lb 3.8 oz 220 lb 3.8 oz 230 lb 2.6 oz  Weight (kg) 99.9 kg 99.9 kg 104.4 kg     Body mass index is 47.66 kg/m.  General:  Well nourished, well developed, in no acute distress HEENT: normal Neck: no JVD Vascular: No carotid bruits; Distal pulses 2+ bilaterally Cardiac:  normal S1, S2; RRR; no murmur  Lungs:  clear to auscultation bilaterally, no wheezing, rhonchi or rales  Abd: soft, nontender, no hepatomegaly  Ext: Bilateral AKA, no edema Skin: warm and dry  Psych:  Normal affect   EKG:  The EKG was personally reviewed and demonstrates:  4/16 0134- Sinus rhythm with frequent PACs, first degree AV block, and nonspecific ST/T abnormalities in inferior leads, rate 102 bpm 4/16 0411- Sinus rhythm with first degree AV block, diffuse TWI, rate 70 bpm Telemetry:  Telemetry was personally reviewed and demonstrates: sinus rhythm with frequent PACs, ~0100 had a run of atrial tachycardia rate 110-120 bpm with frequent PACs, now showing sinus bradycardia with frequent PACs, rate 35-45 bpm  Relevant CV Studies:  10/02/2023 Echo complete 1. Left ventricular ejection fraction, by estimation, is >55%. The  left  ventricle has normal function. Left ventricular endocardial border not  optimally defined to evaluate regional wall motion. There is mild left  ventricular hypertrophy. Left  ventricular diastolic parameters are indeterminate.   2. Right ventricular systolic function is low normal. The right  ventricular size is normal.   3. The mitral valve is normal in structure. No evidence of mitral valve  regurgitation. No evidence of mitral stenosis.   4. The aortic valve is tricuspid. There is moderate thickening of the  aortic valve. Aortic valve regurgitation is not visualized. Aortic valve  sclerosis is present, with no evidence of aortic valve stenosis.   Laboratory Data:  High Sensitivity Troponin:   Recent Labs  Lab 10/03/23 0222 10/03/23 0421 10/03/23 0935  TROPONINIHS 6,721* 6,948* 5,122*     Chemistry Recent Labs  Lab 10/01/23 0912 10/02/23 0600 10/03/23 0222  NA 138 133* 133*  K 3.0* 3.5 3.3*  CL 101 98 97*  CO2 23 24 21*  GLUCOSE 180* 147* 148*  BUN 31* 23 32*  CREATININE 7.42* 5.13* 6.23*  CALCIUM 8.6* 8.6* 8.3*  MG  --   --  1.9  GFRNONAA 5* 8* 7*  ANIONGAP 14 11 15     Recent Labs  Lab 10/01/23 0912 10/02/23 0600  PROT 6.7 6.3*  ALBUMIN 3.1* 2.9*  AST 25 81*  ALT 7 16  ALKPHOS 70 68  BILITOT 0.9 1.0   Lipids No results for input(s): "CHOL", "TRIG", "HDL", "LABVLDL", "LDLCALC", "CHOLHDL" in the last 168 hours.  Hematology Recent Labs  Lab 10/01/23 0912 10/02/23 0600 10/03/23 0222  WBC 12.3* 8.1 5.2  RBC 4.42 4.30 4.11  HGB 11.6* 11.2* 10.9*  HCT 35.9* 34.2* 32.4*  MCV 81.2 79.5* 78.8*  MCH 26.2 26.0 26.5  MCHC 32.3 32.7 33.6  RDW 17.8* 17.3* 17.5*  PLT 105* 108* 107*   Thyroid No results for input(s): "TSH", "FREET4" in the last 168 hours.  BNP Recent Labs  Lab 10/01/23 0912  BNP 1,436.1*    DDimer No results for input(s): "DDIMER" in the last 168 hours.  Radiology/Studies:  DG Chest Port 1 View Result Date:  10/01/2023 IMPRESSION: Lower lung volumes with probable vascular congestion and mildly increased left basilar atelectasis. No definite evidence of pneumonia. Electronically Signed   By: Elmon Hagedorn M.D.   On: 10/01/2023 10:12   Assessment and Plan:   Elevated troponin - Nurse noted ST segment changes on telemetry early this morning 4/15, IM doctor reviewed and felt EKG to be unchanged from prior - EKG at that time and repeat this morning show nonspecific ST/T abnormalities - Troponin [phone] - Patient denies chest pain - Suspect demand ischemia in the setting of septic shock, but cannot exclude ACS - Echo this admission showed EF >55%, unable to evaluate regional wall motion abnormalities - Given risk factors including T2DM, HLD, and PVD and significantly elevated troponin, recommend proceeding with cardiac catheterization. Vitals are unstable at this time with significant bradycardia and hypotension. Will tentatively plan for procedure tomorrow afternoon pending stabilization of vitals.  - Continue IV heparin - Will check lipid panel, continue atorvastatin 40 mg daily once LFTs are stable  Septic shock Serratia bacteremia likely secondary to HD catheter - S/p HD catheter removal 4/15 - Weaned off pressors, continued on midodrine - BP and HR remain low, tizanadine held as likely contributing - Management per CCM and ID  ESRD on HD - Tentative plan for new HD catheter placement on 4/18 - HD per nephrology  Acute hypoxic respiratory failure - Improved, no longer requiring supplemental O2  T2DM - A1C 6.5 - Management per CCM  For questions or updates, please contact [facility] Please consult [email or web address] for contact info under    Signed, Brodie Cannon, PA-C  10/03/2023 10:55 AM

## 2023-10-03 NOTE — Consult Note (Addendum)
 PHARMACY - ANTICOAGULATION CONSULT NOTE  Pharmacy Consult for IV Heparin Indication: chest pain/ACS  Patient Measurements: Weight: 99.9 kg (220 lb 3.8 oz) HDW: 65 kg  Labs: Recent Labs    10/01/23 0912 10/02/23 0600 10/03/23 0222 10/03/23 0421 10/03/23 0935 10/03/23 1018 10/03/23 1853  HGB 11.6* 11.2* 10.9*  --   --   --   --   HCT 35.9* 34.2* 32.4*  --   --   --   --   PLT 105* 108* 107*  --   --   --   --   APTT  --   --   --   --   --  35  --   LABPROT 17.4*  --   --   --   --  15.1  --   INR 1.4*  --   --   --   --  1.2  --   HEPARINUNFRC  --   --   --   --   --   --  0.10*  CREATININE 7.42* 5.13* 6.23*  --   --   --   --   TROPONINIHS  --   --  6,721* 7,846* 5,122*  --   --     Estimated Creatinine Clearance: 8.1 mL/min (A) (by C-G formula based on SCr of 6.23 mg/dL (H)).   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 Troponin checked 4/16 and profoundly elevated. Concern for ACS, pharmacy consulted to manage heparin infusion.  Baseline aPTT 35s, INR 1.2. CBC notable for stable anemia / mild thrombocytopenia  Goal of Therapy:  Heparin level 0.3-0.7 units/ml Monitor platelets by anticoagulation protocol: Yes   Plan:  --4/16@1853 : HL 0.10, subtherapeutic --Bolus 2000 units x 1 --Increase heparin infusion rate to 1000 units/hr --Check HL 8 hours after rate change(with morning labs) --Daily CBC per protocol while on IV heparin  Lizvet Chunn A Nayzeth Altman 10/03/2023,7:13 PM

## 2023-10-03 NOTE — Progress Notes (Signed)
 [facility]  ROUNDING NOTE   Subjective:   Amy Wu  is a 73 y.o.  female with past medical history of hypertension, diabetes, PVD, dyslipidemia, and end-stage renal disease on dialysis. She presents to the ED via EMS for evaluation of altered mental status and fever and has been admitted under observation for Acute respiratory failure with hypoxia (HCC) [J96.01] Sepsis (HCC) [A41.9] Sepsis, due to unspecified organism, unspecified whether acute organ dysfunction present [facility]) [A41.9]  Patient is known to our practice and receives outpatient dialysis treatments at [facility] on a MWF schedule, supervised by [facility] physicians.   Patient sitting up in bed Alert and oriented 3L [place] Denies shortness of breath Pressors: Levo   Objective:  Vital signs in last 24 hours:  Temp:  [98 F (36.7 C)-100.3 F (37.9 C)] 98 F (36.7 C) (04/16 0800) Pulse Rate:  [40-92] 60 (04/16 1000) Resp:  [0-34] 13 (04/16 1000) BP: (75-155)/(37-109) 112/49 (04/16 1000) SpO2:  [93 %-100 %] 100 % (04/16 1000) FiO2 (%):  [30 %-40 %] 30 % (04/16 0800) Weight:  [99.9 kg] 99.9 kg (04/15 1755)  Weight change: -4.4 kg Filed Weights   10/01/23 1633 10/02/23 0511 10/02/23 1755  Weight: 104.4 kg 99.9 kg 99.9 kg    Intake/Output: I/O last 3 completed shifts: In: 516.9 [P.O.:50; I.V.:116.9; IV Piggyback:350] Out: 1501 [Other:1500; Stool:1]   Intake/Output this shift:  Total I/O In: 131.6 [P.O.:80; I.V.:51.6] Out: 0   Physical Exam: General: NAD  Head: Normocephalic, atraumatic.   Eyes: Anicteric  Lungs:  Diminished, [place] O2  Heart: Regular rate and rhythm  Abdomen:  Soft, nontender  Extremities:  Trace Rt stump peripheral edema. Bilat AKA  Neurologic: Alert, moving all four extremities  Skin: No lesions  Access: Rt thigh permcath (removed on 4/15)    Basic Metabolic Panel: Recent Labs  Lab 10/01/23 0912 10/02/23 0600 10/03/23 0222  NA 138 133* 133*  K 3.0* 3.5 3.3*  CL  101 98 97*  CO2 23 24 21*  GLUCOSE 180* 147* 148*  BUN 31* 23 32*  CREATININE 7.42* 5.13* 6.23*  CALCIUM 8.6* 8.6* 8.3*  MG  --   --  1.9  PHOS  --   --  4.0    Liver Function Tests: Recent Labs  Lab 10/01/23 0912 10/02/23 0600  AST 25 81*  ALT 7 16  ALKPHOS 70 68  BILITOT 0.9 1.0  PROT 6.7 6.3*  ALBUMIN 3.1* 2.9*   No results for input(s): "LIPASE", "AMYLASE" in the last 168 hours. Recent Labs  Lab 10/01/23 2221  AMMONIA 12    CBC: Recent Labs  Lab 10/01/23 0912 10/02/23 0600 10/03/23 0222  WBC 12.3* 8.1 5.2  NEUTROABS 10.7*  --   --   HGB 11.6* 11.2* 10.9*  HCT 35.9* 34.2* 32.4*  MCV 81.2 79.5* 78.8*  PLT 105* 108* 107*    Cardiac Enzymes: No results for input(s): "CKTOTAL", "CKMB", "CKMBINDEX", "TROPONINI" in the last 168 hours.  BNP: Invalid input(s): "POCBNP"  CBG: Recent Labs  Lab 10/02/23 1210 10/02/23 1650 10/02/23 1715 10/02/23 2128 10/03/23 0743  GLUCAP 139* 147* 140* 121* 183*    Microbiology: Results for orders placed or performed during the hospital encounter of 10/01/23  Resp panel by RT-PCR (RSV, Flu A&B, Covid) Anterior Nasal Swab     Status: None   Collection Time: 10/01/23  9:12 AM   Specimen: Anterior Nasal Swab  Result Value Ref Range Status   SARS Coronavirus 2 by RT  PCR NEGATIVE NEGATIVE Final    Comment: (NOTE) SARS-CoV-2 target nucleic acids are NOT DETECTED.  The SARS-CoV-2 RNA is generally detectable in upper respiratory specimens during the acute phase of infection. The lowest concentration of SARS-CoV-2 viral copies this assay can detect is 138 copies/mL. A negative result does not preclude SARS-Cov-2 infection and should not be used as the sole basis for treatment or other patient management decisions. A negative result may occur with  improper specimen collection/handling, submission of specimen other than nasopharyngeal swab, presence of viral mutation(s) within the areas targeted by this assay, and  inadequate number of viral copies(<138 copies/mL). A negative result must be combined with clinical observations, patient history, and epidemiological information. The expected result is Negative.  Fact Sheet for Patients:  BloggerCourse.com  Fact Sheet for Healthcare Providers:  SeriousBroker.it  This test is no t yet approved or cleared by the [place]  FDA and  has been authorized for detection and/or diagnosis of SARS-CoV-2 by FDA under an Emergency Use Authorization (EUA). This EUA will remain  in effect (meaning this test can be used) for the duration of the COVID-19 declaration under Section 564(b)(1) of the Act, 21 U.S.C.section 360bbb-3(b)(1), unless the authorization is terminated  or revoked sooner.       Influenza A by PCR NEGATIVE NEGATIVE Final   Influenza B by PCR NEGATIVE NEGATIVE Final    Comment: (NOTE) The Xpert Xpress SARS-CoV-2/FLU/RSV plus assay is intended as an aid in the diagnosis of influenza from Nasopharyngeal swab specimens and should not be used as a sole basis for treatment. Nasal washings and aspirates are unacceptable for Xpert Xpress SARS-CoV-2/FLU/RSV testing.  Fact Sheet for Patients: BloggerCourse.com  Fact Sheet for Healthcare Providers: SeriousBroker.it  This test is not yet approved or cleared by the [place]  FDA and has been authorized for detection and/or diagnosis of SARS-CoV-2 by FDA under an Emergency Use Authorization (EUA). This EUA will remain in effect (meaning this test can be used) for the duration of the COVID-19 declaration under Section 564(b)(1) of the Act, 21 U.S.C. section 360bbb-3(b)(1), unless the authorization is terminated or revoked.     Resp Syncytial Virus by PCR NEGATIVE NEGATIVE Final    Comment: (NOTE) Fact Sheet for Patients: BloggerCourse.com  Fact Sheet for Healthcare  Providers: SeriousBroker.it  This test is not yet approved or cleared by the [place]  FDA and has been authorized for detection and/or diagnosis of SARS-CoV-2 by FDA under an Emergency Use Authorization (EUA). This EUA will remain in effect (meaning this test can be used) for the duration of the COVID-19 declaration under Section 564(b)(1) of the Act, 21 U.S.C. section 360bbb-3(b)(1), unless the authorization is terminated or revoked.  Performed at [facility], [address] [place], [place] [address]   Blood Culture (routine x 2)     Status: None (Preliminary result)   Collection Time: 10/01/23  9:12 AM   Specimen: BLOOD  Result Value Ref Range Status   Specimen Description   Final    BLOOD BLOOD LEFT FOREARM Performed at [facility], [address], [place], [place] [address]    Special Requests   Final    BOTTLES DRAWN AEROBIC AND ANAEROBIC Blood Culture results may not be optimal due to an inadequate volume of blood received in culture bottles Performed at [facility], [address], [place], [place] [address]    Culture  Setup Time   Final    GRAM NEGATIVE RODS IN BOTH AEROBIC AND ANAEROBIC  BOTTLES CRITICAL VALUE NOTED.  VALUE IS CONSISTENT WITH PREVIOUSLY REPORTED AND CALLED VALUE. Performed at [facility], [address] [place], [place] [address]    Culture GRAM NEGATIVE RODS  Final   Report Status PENDING  Incomplete  Respiratory (~20 pathogens) panel by PCR     Status: None   Collection Time: 10/01/23  9:12 AM   Specimen: Nasopharyngeal Swab; Respiratory  Result Value Ref Range Status   Adenovirus NOT DETECTED NOT DETECTED Final   Coronavirus 229E NOT DETECTED NOT DETECTED Final    Comment: (NOTE) The Coronavirus on the Respiratory Panel, DOES NOT test for the novel  Coronavirus (2019 nCoV)    Coronavirus HKU1 NOT DETECTED NOT DETECTED Final   Coronavirus NL63 NOT DETECTED NOT DETECTED  Final   Coronavirus OC43 NOT DETECTED NOT DETECTED Final   Metapneumovirus NOT DETECTED NOT DETECTED Final   Rhinovirus / Enterovirus NOT DETECTED NOT DETECTED Final   Influenza A NOT DETECTED NOT DETECTED Final   Influenza B NOT DETECTED NOT DETECTED Final   Parainfluenza Virus 1 NOT DETECTED NOT DETECTED Final   Parainfluenza Virus 2 NOT DETECTED NOT DETECTED Final   Parainfluenza Virus 3 NOT DETECTED NOT DETECTED Final   Parainfluenza Virus 4 NOT DETECTED NOT DETECTED Final   Respiratory Syncytial Virus NOT DETECTED NOT DETECTED Final   Bordetella pertussis NOT DETECTED NOT DETECTED Final   Bordetella Parapertussis NOT DETECTED NOT DETECTED Final   Chlamydophila pneumoniae NOT DETECTED NOT DETECTED Final   Mycoplasma pneumoniae NOT DETECTED NOT DETECTED Final    Comment: Performed at [facility], [address], [place], [place] [address]  Blood Culture (routine x 2)     Status: Abnormal (Preliminary result)   Collection Time: 10/01/23  9:33 AM   Specimen: BLOOD  Result Value Ref Range Status   Specimen Description   Final    BLOOD RIGHT WRIST Performed at [facility], [address], [place], [place] [address]    Special Requests   Final    BOTTLES DRAWN AEROBIC AND ANAEROBIC Blood Culture adequate volume Performed at [facility], [address] [place], [place] [address]    Culture  Setup Time   Final    GRAM NEGATIVE RODS IN BOTH AEROBIC AND ANAEROBIC BOTTLES CRITICAL RESULT CALLED TO, READ BACK BY AND VERIFIED WITH:  NATHAN BELUE AT 2956 10/02/23 JG    Culture (A)  Final    SERRATIA MARCESCENS SUSCEPTIBILITIES TO FOLLOW Performed at [facility], [address], [place], [place] [address]    Report Status PENDING  Incomplete  Blood Culture ID Panel (Reflexed)     Status: Abnormal   Collection Time: 10/01/23  9:33 AM  Result Value Ref Range Status   Enterococcus faecalis NOT DETECTED NOT DETECTED Final   Enterococcus Faecium NOT  DETECTED NOT DETECTED Final   Listeria monocytogenes NOT DETECTED NOT DETECTED Final   Staphylococcus species NOT DETECTED NOT DETECTED Final   Staphylococcus aureus (BCID) NOT DETECTED NOT DETECTED Final   Staphylococcus epidermidis NOT DETECTED NOT DETECTED Final   Staphylococcus lugdunensis NOT DETECTED NOT DETECTED Final   Streptococcus species NOT DETECTED NOT DETECTED Final   Streptococcus agalactiae NOT DETECTED NOT DETECTED Final   Streptococcus pneumoniae NOT DETECTED NOT DETECTED Final   Streptococcus pyogenes NOT DETECTED NOT DETECTED Final   A.calcoaceticus-baumannii NOT DETECTED NOT DETECTED Final   Bacteroides fragilis NOT DETECTED NOT DETECTED Final   Enterobacterales DETECTED (A) NOT DETECTED Final  Comment: Enterobacterales represent a large order of gram negative bacteria, not a single organism. CRITICAL RESULT CALLED TO, READ BACK BY AND VERIFIED WITH:  NATHAN BELUE AT 0457 10/02/23 JG    Enterobacter cloacae complex NOT DETECTED NOT DETECTED Final   Escherichia coli NOT DETECTED NOT DETECTED Final   Klebsiella aerogenes NOT DETECTED NOT DETECTED Final   Klebsiella oxytoca NOT DETECTED NOT DETECTED Final   Klebsiella pneumoniae NOT DETECTED NOT DETECTED Final   Proteus species NOT DETECTED NOT DETECTED Final   Salmonella species NOT DETECTED NOT DETECTED Final   Serratia marcescens DETECTED (A) NOT DETECTED Final    Comment: CRITICAL RESULT CALLED TO, READ BACK BY AND VERIFIED WITH:  NATHAN BELUE AT 0457 10/02/23 JG    Haemophilus influenzae NOT DETECTED NOT DETECTED Final   Neisseria meningitidis NOT DETECTED NOT DETECTED Final   Pseudomonas aeruginosa NOT DETECTED NOT DETECTED Final   Stenotrophomonas maltophilia NOT DETECTED NOT DETECTED Final   Candida albicans NOT DETECTED NOT DETECTED Final   Candida auris NOT DETECTED NOT DETECTED Final   Candida glabrata NOT DETECTED NOT DETECTED Final   Candida krusei NOT DETECTED NOT DETECTED Final   Candida  parapsilosis NOT DETECTED NOT DETECTED Final   Candida tropicalis NOT DETECTED NOT DETECTED Final   Cryptococcus neoformans/gattii NOT DETECTED NOT DETECTED Final   CTX-M ESBL NOT DETECTED NOT DETECTED Final   Carbapenem resistance IMP NOT DETECTED NOT DETECTED Final   Carbapenem resistance KPC NOT DETECTED NOT DETECTED Final   Carbapenem resistance NDM NOT DETECTED NOT DETECTED Final   Carbapenem resist OXA 48 LIKE NOT DETECTED NOT DETECTED Final   Carbapenem resistance VIM NOT DETECTED NOT DETECTED Final    Comment: Performed at [facility], [address] [place], [place] [address]  MRSA Next Gen by PCR, Nasal     Status: None   Collection Time: 10/02/23  5:26 PM   Specimen: Nasal Mucosa; Nasal Swab  Result Value Ref Range Status   MRSA by PCR Next Gen NOT DETECTED NOT DETECTED Final    Comment: (NOTE) The GeneXpert MRSA Assay (FDA approved for NASAL specimens only), is one component of a comprehensive MRSA colonization surveillance program. It is not intended to diagnose MRSA infection nor to guide or monitor treatment for MRSA infections. Test performance is not FDA approved in patients less than 42 years old. Performed at [facility], [address] [place], [place] [address]     Coagulation Studies: Recent Labs    10/01/23 0912  LABPROT 17.4*  INR 1.4*    Urinalysis: No results for input(s): "COLORURINE", "LABSPEC", "PHURINE", "GLUCOSEU", "HGBUR", "BILIRUBINUR", "KETONESUR", "PROTEINUR", "UROBILINOGEN", "NITRITE", "LEUKOCYTESUR" in the last 72 hours.  Invalid input(s): "APPERANCEUR"    Imaging: ECHOCARDIOGRAM COMPLETE Result Date: 10/02/2023    ECHOCARDIOGRAM REPORT   Patient Name:   Amy Wu Date of Exam: 10/02/2023 Medical Rec #:  [id number]     Height:       57.0 in Accession #:    [id number]    Weight:       220.2 lb Date of Birth:  08/23/1950    BSA:          1.874 m Patient Age:    72 years      BP:           154/64 mmHg Patient  Gender: F             HR:           89  bpm. Exam Location:  [facility] Procedure: 2D Echo, Cardiac Doppler, Color Doppler and Intracardiac            Opacification Agent (Both Spectral and Color Flow Doppler were            utilized during procedure). Indications:     Cardiomegaly  History:         Patient has no prior history of Echocardiogram examinations.                  Cardiomegaly; Risk Factors:Hypertension, Diabetes and                  Dyslipidemia. ESRD on Dialysis.  Sonographer:     Mikki Harbor Referring Phys:  [phone] Francoise Schaumann [place] Diagnosing Phys: Yvonne Kendall MD  Sonographer Comments: Technically difficult study due to poor echo windows, suboptimal apical window and patient is obese. Image acquisition challenging due to respiratory motion. IMPRESSIONS  1. Left ventricular ejection fraction, by estimation, is >55%. The left ventricle has normal function. Left ventricular endocardial border not optimally defined to evaluate regional wall motion. There is mild left ventricular hypertrophy. Left ventricular diastolic parameters are indeterminate.  2. Right ventricular systolic function is low normal. The right ventricular size is normal.  3. The mitral valve is normal in structure. No evidence of mitral valve regurgitation. No evidence of mitral stenosis.  4. The aortic valve is tricuspid. There is moderate thickening of the aortic valve. Aortic valve regurgitation is not visualized. Aortic valve sclerosis is present, with no evidence of aortic valve stenosis. FINDINGS  Left Ventricle: Left ventricular ejection fraction, by estimation, is >55%. The left ventricle has normal function. Left ventricular endocardial border not optimally defined to evaluate regional wall motion. Definity contrast agent was given IV to delineate the left ventricular endocardial borders. The left ventricular internal cavity size was normal in size. There is mild left ventricular hypertrophy. Left ventricular diastolic parameters  are indeterminate. Right Ventricle: The right ventricular size is normal. Right vetricular wall thickness was not well visualized. Right ventricular systolic function is low normal. Left Atrium: Left atrial size was normal in size. Right Atrium: Right atrial size was not well visualized. Pericardium: The pericardium was not well visualized. Mitral Valve: The mitral valve is normal in structure. Mild mitral annular calcification. No evidence of mitral valve regurgitation. No evidence of mitral valve stenosis. MV peak gradient, 6.8 mmHg. The mean mitral valve gradient is 3.0 mmHg. Tricuspid Valve: The tricuspid valve is normal in structure. Tricuspid valve regurgitation is mild. Aortic Valve: The aortic valve is tricuspid. There is moderate thickening of the aortic valve. Aortic valve regurgitation is not visualized. Aortic valve sclerosis is present, with no evidence of aortic valve stenosis. Aortic valve mean gradient measures  5.0 mmHg. Aortic valve peak gradient measures 11.0 mmHg. Aortic valve area, by VTI measures 2.65 cm. Pulmonic Valve: The pulmonic valve was grossly normal. Pulmonic valve regurgitation is trivial. No evidence of pulmonic stenosis. Aorta: The aortic root is normal in size and structure. Pulmonary Artery: The pulmonary artery is of normal size. Venous: The inferior vena cava was not well visualized. IAS/Shunts: The interatrial septum was not well visualized.  LEFT VENTRICLE PLAX 2D LVIDd:         3.80 cm   Diastology LVIDs:         2.50 cm   LV e' medial:    8.60 cm/s LV PW:         1.15 cm   LV  E/e' medial:  12.3 LV IVS:        1.06 cm   LV e' lateral:   7.22 cm/s LVOT diam:     2.10 cm   LV E/e' lateral: 14.7 LV SV:         79 LV SV Index:   42 LVOT Area:     3.46 cm  LEFT ATRIUM           Index LA diam:      3.60 cm 1.92 cm/m LA Vol (A4C): 60.2 ml 32.13 ml/m  AORTIC VALVE                     PULMONIC VALVE AV Area (Vmax):    2.94 cm      PV Vmax:       0.94 m/s AV Area (Vmean):   2.62  cm      PV Peak grad:  3.5 mmHg AV Area (VTI):     2.65 cm AV Vmax:           166.00 cm/s AV Vmean:          107.000 cm/s AV VTI:            0.296 m AV Peak Grad:      11.0 mmHg AV Mean Grad:      5.0 mmHg LVOT Vmax:         141.00 cm/s LVOT Vmean:        81.050 cm/s LVOT VTI:          0.227 m LVOT/AV VTI ratio: 0.77  AORTA Ao Root diam: 3.30 cm MITRAL VALVE                TRICUSPID VALVE MV Area (PHT): 2.71 cm     TR Peak grad:   34.3 mmHg MV Area VTI:   2.34 cm     TR Vmax:        293.00 cm/s MV Peak grad:  6.8 mmHg MV Mean grad:  3.0 mmHg     SHUNTS MV Vmax:       1.30 m/s     Systemic VTI:  0.23 m MV Vmean:      82.3 cm/s    Systemic Diam: 2.10 cm MV Decel Time: 280 msec MV E velocity: 106.00 cm/s MV A velocity: 114.00 cm/s MV E/A ratio:  0.93 Christopher End MD Electronically signed by Sammy Crisp MD Signature Date/Time: 10/02/2023/1:03:27 PM    Final      Medications:    ceFEPime (MAXIPIME) IV Stopped (10/02/23 2239)   heparin     norepinephrine (LEVOPHED) Adult infusion 1 mcg/min (10/03/23 1004)    Chlorhexidine Gluconate Cloth  6 each Topical Q0600   insulin aspart  0-9 Units Subcutaneous TID WC   midodrine  5 mg Oral TID WC   tiZANidine  2 mg Oral BID   acetaminophen, gabapentin, Gerhardt's butt cream, ondansetron **OR** ondansetron (ZOFRAN) IV  Assessment/ Plan:  Amy Wu is a 73 y.o.  female with past medical history of hypertension, diabetes, PVD, dyslipidemia, and end-stage renal disease on dialysis. She presents to the ED via EMS for evaluation of altered mental status and fever and has been admitted under observation for Acute respiratory failure with hypoxia (HCC) [J96.01] Sepsis (HCC) [A41.9] Sepsis, due to unspecified organism, unspecified whether acute organ dysfunction present (HCC) [A41.9]  [facility]/MWF/Rt thigh Permcath  End stage renal disease on hemodialysis.  Patient received dialysis yesterday due  to respiratory distress.  UF 1.5 L  achieved with the assistance of pressors.  Patient able to wean off BiPAP and is currently on nasal cannula.  Tunneled access removed after treatment due to current infection.  Will monitor patient labs and clinical presentation.  Will perform 1 to 2-day line holiday prior to replacement dialysis access.  Sepsis, unknown source at this time. Admitted with fever 103, and elevated white count, 12.2. Blood culture positive for serratia marcescens. ID request removal of tunneled catheter. Currently prescribed azithromycin, cefepime and levaquin.  Dialysis catheter removed after treatment on 4/15.  Catheter tip sent for culture.  Will proceed with 1 to 2-day line holiday.  3. Acute respiratory failure requiring Bipap. Volume overload vs pneumonia.  Symptoms improved with fluid removal on dialysis.  Weaned to nasal cannula.  4. Anemia of chronic kidney disease Lab Results  Component Value Date   HGB 10.9 (L) 10/03/2023    Hemoglobin remains acceptable.  5. Secondary Hyperparathyroidism: with outpatient labs: None available   Lab Results  Component Value Date   CALCIUM 8.3 (L) 10/03/2023   PHOS 4.0 10/03/2023     Currently prescribed calcitriol and cinacalcet outpatient.  Bone mineral is currently acceptable.   LOS: 1 Niraj Kudrna 4/16/202510:31 AM

## 2023-10-03 NOTE — Progress Notes (Signed)
 Cardiology and Intensives are aware of heart rate in the 30's and 40's. Currently monitoring the patient at this time and surgical  procedure is schedule for tomorrow at 1230pm. NPO at midnight.

## 2023-10-03 NOTE — Progress Notes (Signed)
 MD notified: good morning Dr. Antoniette Batty this patient got dialyzed yesterday when she was transferred to stepdown around 5:30 pm. Also Night shift RN removed right femoral dialysis catheter after her treatment after speaking with Dr. Vallarie Gauze I believe. Her dialysis days are M, W, F. Do you know if she will get a new dialysis catheter placed anytime soon. She had also developed soft BP's this morning and levo was started at 5 mcg/min. She had ST elevations on inferior leads. Troponin at 0200 am was 6,721 and at 0400 am was 6,948 and pt reports no chest pain if abnormalities. It was past in report this morning that Dr. Vallarie Gauze will notify you about the troponin levels and if further levels are needed.

## 2023-10-03 NOTE — Consult Note (Signed)
 PHARMACY - ANTICOAGULATION CONSULT NOTE  Pharmacy Consult for IV Heparin Indication: chest pain/ACS  Patient Measurements: Weight: 99.9 kg (220 lb 3.8 oz) HDW: 65 kg  Labs: Recent Labs    10/01/23 0912 10/02/23 0600 10/03/23 0222 10/03/23 0421 10/03/23 0935 10/03/23 1018  HGB 11.6* 11.2* 10.9*  --   --   --   HCT 35.9* 34.2* 32.4*  --   --   --   PLT 105* 108* 107*  --   --   --   APTT  --   --   --   --   --  35  LABPROT 17.4*  --   --   --   --  15.1  INR 1.4*  --   --   --   --  1.2  CREATININE 7.42* 5.13* 6.23*  --   --   --   TROPONINIHS  --   --  6,721* 1,610* 5,122*  --     Estimated Creatinine Clearance: 8.1 mL/min (A) (by C-G formula based on SCr of 6.23 mg/dL (H)).   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 Troponin checked 4/16 and profoundly elevated. Concern for ACS, pharmacy consulted to manage heparin infusion.  Baseline aPTT 35s, INR 1.2. CBC notable for stable anemia / mild thrombocytopenia  Goal of Therapy:  Heparin level 0.3-0.7 units/ml Monitor platelets by anticoagulation protocol: Yes   Plan:  --Start heparin at 800 units/hr, no bolus given proximity to [place] heparin dose for DVT ppx --Check HL 8 hours from initiation of infusion --Daily CBC per protocol while on IV heparin  Page Boast 10/03/2023,11:41 AM

## 2023-10-03 NOTE — Plan of Care (Signed)
  Problem: Education: Goal: Knowledge of General Education information will improve Description: Including pain rating scale, medication(s)/side effects and non-pharmacologic comfort measures Outcome: Progressing   Problem: Clinical Measurements: Goal: Respiratory complications will improve Outcome: Progressing Goal: Cardiovascular complication will be avoided Outcome: Progressing   Problem: Activity: Goal: Risk for activity intolerance will decrease Outcome: Progressing   Problem: Nutrition: Goal: Adequate nutrition will be maintained Outcome: Progressing   Problem: Coping: Goal: Level of anxiety will decrease Outcome: Progressing   

## 2023-10-03 NOTE — Progress Notes (Addendum)
 ST Elevation noted on inferior Leads. Informed Dr Vallarie Gauze. EKG and stat Troponin done. Levophed restarted at this time as BP dropped to 80's systolic. IV team called to re insert another IV line as the previous site for levophed got occluded. Patient was drowsy on assessment but was able to follow commands. Patient was started on BIPAP. Critical Troponin of 6721 relayed to Dr Vallarie Gauze. Patient is chest pain free. Advised to trend troponin. Will order Echo and cardiology referral in the morning.

## 2023-10-03 NOTE — Plan of Care (Signed)
  Problem: Education: Goal: Knowledge of General Education information will improve Description: Including pain rating scale, medication(s)/side effects and non-pharmacologic comfort measures Outcome: Progressing   Problem: Health Behavior/Discharge Planning: Goal: Ability to manage health-related needs will improve Outcome: Progressing   Problem: Clinical Measurements: Goal: Ability to maintain clinical measurements within normal limits will improve Outcome: Progressing Goal: Will remain free from infection Outcome: Progressing Goal: Diagnostic test results will improve Outcome: Progressing Goal: Respiratory complications will improve Outcome: Progressing Goal: Cardiovascular complication will be avoided Outcome: Progressing   Problem: Activity: Goal: Risk for activity intolerance will decrease Outcome: Progressing   Problem: Nutrition: Goal: Adequate nutrition will be maintained Outcome: Progressing   Problem: Coping: Goal: Level of anxiety will decrease Outcome: Progressing   Problem: Elimination: Goal: Will not experience complications related to bowel motility Outcome: Progressing Goal: Will not experience complications related to urinary retention Outcome: Progressing   Problem: Pain Managment: Goal: General experience of comfort will improve and/or be controlled Outcome: Progressing   Problem: Safety: Goal: Ability to remain free from injury will improve Outcome: Progressing   Problem: Skin Integrity: Goal: Risk for impaired skin integrity will decrease Outcome: Progressing   Problem: Fluid Volume: Goal: Hemodynamic stability will improve Outcome: Progressing   Problem: Clinical Measurements: Goal: Diagnostic test results will improve Outcome: Progressing Goal: Signs and symptoms of infection will decrease Outcome: Progressing   Problem: Respiratory: Goal: Ability to maintain adequate ventilation will improve Outcome: Progressing   Problem:  Education: Goal: Ability to describe self-care measures that may prevent or decrease complications (Diabetes Survival Skills Education) will improve Outcome: Progressing Goal: Individualized Educational Video(s) Outcome: Progressing   Problem: Coping: Goal: Ability to adjust to condition or change in health will improve Outcome: Progressing   Problem: Fluid Volume: Goal: Ability to maintain a balanced intake and output will improve Outcome: Progressing   Problem: Health Behavior/Discharge Planning: Goal: Ability to identify and utilize available resources and services will improve Outcome: Progressing Goal: Ability to manage health-related needs will improve Outcome: Progressing   Problem: Metabolic: Goal: Ability to maintain appropriate glucose levels will improve Outcome: Progressing   Problem: Nutritional: Goal: Maintenance of adequate nutrition will improve Outcome: Progressing Goal: Progress toward achieving an optimal weight will improve Outcome: Progressing   Problem: Skin Integrity: Goal: Risk for impaired skin integrity will decrease Outcome: Progressing   Problem: Tissue Perfusion: Goal: Adequacy of tissue perfusion will improve Outcome: Progressing   Problem: Education: Goal: Understanding of CV disease, CV risk reduction, and recovery process will improve Outcome: Progressing Goal: Individualized Educational Video(s) Outcome: Progressing   Problem: Activity: Goal: Ability to return to baseline activity level will improve Outcome: Progressing   Problem: Cardiovascular: Goal: Ability to achieve and maintain adequate cardiovascular perfusion will improve Outcome: Progressing Goal: Vascular access site(s) Level 0-1 will be maintained Outcome: Progressing   Problem: Health Behavior/Discharge Planning: Goal: Ability to safely manage health-related needs after discharge will improve Outcome: Progressing

## 2023-10-03 NOTE — Progress Notes (Signed)
 RN has notified Janey Meek NP about the pt's heart rate which has dropped to 37 beats for minute. NP has ordered 5 mg of midodrine which has been given. The patient reports being asymptotic at this time.

## 2023-10-03 NOTE — Progress Notes (Incomplete)
 {  Select_TRH_Note:[id number]}

## 2023-10-04 ENCOUNTER — Encounter
Admission: EM | Disposition: A | Discharge status: Home Health | Payer: Self-pay | Source: Home / Self Care | Attending: Osteopathic Medicine

## 2023-10-04 DIAGNOSIS — I2510 Atherosclerotic heart disease of native coronary artery without angina pectoris: Secondary | ICD-10-CM | POA: Diagnosis not present

## 2023-10-04 DIAGNOSIS — R7989 Other specified abnormal findings of blood chemistry: Secondary | ICD-10-CM

## 2023-10-04 DIAGNOSIS — I214 Non-ST elevation (NSTEMI) myocardial infarction: Secondary | ICD-10-CM | POA: Diagnosis not present

## 2023-10-04 HISTORY — PX: LEFT HEART CATH AND CORONARY ANGIOGRAPHY: CATH118249

## 2023-10-04 LAB — CBC
HCT: 30.4 % — ABNORMAL LOW (ref 36.0–46.0)
Hemoglobin: 10.3 g/dL — ABNORMAL LOW (ref 12.0–15.0)
MCH: 25.9 pg — ABNORMAL LOW (ref 26.0–34.0)
MCHC: 33.9 g/dL (ref 30.0–36.0)
MCV: 76.4 fL — ABNORMAL LOW (ref 80.0–100.0)
Platelets: 115 10*3/uL — ABNORMAL LOW (ref 150–400)
RBC: 3.98 MIL/uL (ref 3.87–5.11)
RDW: 17.4 % — ABNORMAL HIGH (ref 11.5–15.5)
WBC: 7.1 10*3/uL (ref 4.0–10.5)
nRBC: 0 % (ref 0.0–0.2)

## 2023-10-04 LAB — MAGNESIUM: Magnesium: 2.1 mg/dL (ref 1.7–2.4)

## 2023-10-04 LAB — CULTURE, BLOOD (ROUTINE X 2): Special Requests: ADEQUATE

## 2023-10-04 LAB — BASIC METABOLIC PANEL WITH GFR
Anion gap: 12 (ref 5–15)
BUN: 40 mg/dL — ABNORMAL HIGH (ref 8–23)
CO2: 23 mmol/L (ref 22–32)
Calcium: 8.2 mg/dL — ABNORMAL LOW (ref 8.9–10.3)
Chloride: 98 mmol/L (ref 98–111)
Creatinine, Ser: 7.42 mg/dL — ABNORMAL HIGH (ref 0.44–1.00)
GFR, Estimated: 5 mL/min — ABNORMAL LOW (ref >60)
Glucose, Bld: 155 mg/dL — ABNORMAL HIGH (ref 70–99)
Potassium: 3.3 mmol/L — ABNORMAL LOW (ref 3.5–5.1)
Sodium: 133 mmol/L — ABNORMAL LOW (ref 135–145)

## 2023-10-04 LAB — GLUCOSE, CAPILLARY
Glucose-Capillary: 160 mg/dL — ABNORMAL HIGH (ref 70–99)
Glucose-Capillary: 132 mg/dL — ABNORMAL HIGH (ref 70–99)
Glucose-Capillary: 154 mg/dL — ABNORMAL HIGH (ref 70–99)
Glucose-Capillary: 301 mg/dL — ABNORMAL HIGH (ref 70–99)

## 2023-10-04 LAB — HEPARIN LEVEL (UNFRACTIONATED): Heparin Unfractionated: 0.18 [IU]/mL — ABNORMAL LOW (ref 0.30–0.70)

## 2023-10-04 LAB — PHOSPHORUS: Phosphorus: 4.3 mg/dL (ref 2.5–4.6)

## 2023-10-04 SURGERY — LEFT HEART CATH AND CORONARY ANGIOGRAPHY
Anesthesia: Moderate Sedation

## 2023-10-04 MED ORDER — SODIUM CHLORIDE 0.9% FLUSH
3.0000 mL | Freq: Two times a day (BID) | INTRAVENOUS | Status: DC
  Administered 2023-10-04 – 2023-10-05 (×2): 3 mL via INTRAVENOUS

## 2023-10-04 MED ORDER — SODIUM CHLORIDE 0.9% FLUSH: 3.0000 mL | INTRAVENOUS | Status: DC | PRN

## 2023-10-04 MED ORDER — FENTANYL CITRATE (PF) 100 MCG/2ML IJ SOLN
INTRAMUSCULAR | Status: AC
  Filled 2023-10-04: qty 2

## 2023-10-04 MED ORDER — METHYLPREDNISOLONE SODIUM SUCC 125 MG IJ SOLR
INTRAMUSCULAR | Status: AC
  Filled 2023-10-04: qty 2

## 2023-10-04 MED ORDER — HEPARIN SODIUM (PORCINE) 1000 UNIT/ML IJ SOLN
INTRAMUSCULAR | Status: AC
  Filled 2023-10-04: qty 10

## 2023-10-04 MED ORDER — VERAPAMIL HCL 2.5 MG/ML IV SOLN
INTRAVENOUS | Status: DC | PRN
  Administered 2023-10-04 (×3): 2.5 mg via INTRA_ARTERIAL

## 2023-10-04 MED ORDER — HEPARIN SODIUM (PORCINE) 1000 UNIT/ML IJ SOLN
INTRAMUSCULAR | Status: DC | PRN
  Administered 2023-10-04: 5000 [IU] via INTRAVENOUS

## 2023-10-04 MED ORDER — HYDRALAZINE HCL 20 MG/ML IJ SOLN: 10.0000 mg | INTRAMUSCULAR | Status: AC | PRN

## 2023-10-04 MED ORDER — LABETALOL HCL 5 MG/ML IV SOLN: 10.0000 mg | INTRAVENOUS | Status: AC | PRN

## 2023-10-04 MED ORDER — HEPARIN BOLUS VIA INFUSION
2000.0000 [IU] | Freq: Once | INTRAVENOUS | Status: AC
  Administered 2023-10-04: 2000 [IU] via INTRAVENOUS
  Filled 2023-10-04: qty 2000

## 2023-10-04 MED ORDER — HEPARIN (PORCINE) 25000 UT/250ML-% IV SOLN
1250.0000 [IU]/h | INTRAVENOUS | Status: DC
  Administered 2023-10-04: 1250 [IU]/h via INTRAVENOUS

## 2023-10-04 MED ORDER — LIDOCAINE HCL (PF) 1 % IJ SOLN
INTRAMUSCULAR | Status: DC | PRN
  Administered 2023-10-04: 2 mL

## 2023-10-04 MED ORDER — VERAPAMIL HCL 2.5 MG/ML IV SOLN
INTRAVENOUS | Status: AC
  Filled 2023-10-04: qty 2

## 2023-10-04 MED ORDER — LIDOCAINE HCL 1 % IJ SOLN
INTRAMUSCULAR | Status: AC
  Filled 2023-10-04: qty 20

## 2023-10-04 MED ORDER — DIPHENHYDRAMINE HCL 50 MG/ML IJ SOLN
INTRAMUSCULAR | Status: DC | PRN
  Administered 2023-10-04: 25 mg via INTRAVENOUS

## 2023-10-04 MED ORDER — SODIUM CHLORIDE 0.9 % IV SOLN: 250.0000 mL | INTRAVENOUS | Status: DC | PRN

## 2023-10-04 MED ORDER — HEPARIN (PORCINE) IN NACL 1000-0.9 UT/500ML-% IV SOLN
INTRAVENOUS | Status: DC | PRN
  Administered 2023-10-04: 1000 mL

## 2023-10-04 MED ORDER — MIDAZOLAM HCL 2 MG/2ML IJ SOLN
INTRAMUSCULAR | Status: AC
Start: 2023-10-04 — End: ?
  Filled 2023-10-04: qty 2

## 2023-10-04 MED ORDER — IOHEXOL 300 MG/ML  SOLN
INTRAMUSCULAR | Status: DC | PRN
  Administered 2023-10-04: 24 mL

## 2023-10-04 MED ORDER — DIPHENHYDRAMINE HCL 50 MG/ML IJ SOLN
INTRAMUSCULAR | Status: AC
  Filled 2023-10-04: qty 1

## 2023-10-04 MED ORDER — HEPARIN (PORCINE) IN NACL 1000-0.9 UT/500ML-% IV SOLN
INTRAVENOUS | Status: AC
  Filled 2023-10-04: qty 1000

## 2023-10-04 MED ORDER — METHYLPREDNISOLONE SODIUM SUCC 125 MG IJ SOLR
INTRAMUSCULAR | Status: DC | PRN
  Administered 2023-10-04: 125 mg via INTRAVENOUS

## 2023-10-04 SURGICAL SUPPLY — 13 items
CATH INFINITI 5 FR JL3.5 (CATHETERS) IMPLANT
CATH INFINITI AMBI 5FR TG (CATHETERS) IMPLANT
CATH INFINITI JR4 5F (CATHETERS) IMPLANT
DEVICE RAD TR BAND REGULAR (VASCULAR PRODUCTS) IMPLANT
DRAPE BRACHIAL (DRAPES) IMPLANT
GLIDESHEATH SLEND SS 6F .021 (SHEATH) IMPLANT
GUIDEWIRE INQWIRE 1.5J.035X260 (WIRE) IMPLANT
INQWIRE 1.5J .035X260CM (WIRE) ×1 IMPLANT
PACK CARDIAC CATH (CUSTOM PROCEDURE TRAY) ×1 IMPLANT
PROTECTION STATION PRESSURIZED (MISCELLANEOUS) ×1 IMPLANT
SET ATX-X65L (MISCELLANEOUS) IMPLANT
STATION PROTECTION PRESSURIZED (MISCELLANEOUS) IMPLANT
WIRE HITORQ VERSACORE ST 145CM (WIRE) IMPLANT

## 2023-10-04 NOTE — Plan of Care (Signed)
  Problem: Education: Goal: Knowledge of General Education information will improve Description: Including pain rating scale, medication(s)/side effects and non-pharmacologic comfort measures Outcome: Progressing   Problem: Health Behavior/Discharge Planning: Goal: Ability to manage health-related needs will improve Outcome: Progressing   Problem: Clinical Measurements: Goal: Ability to maintain clinical measurements within normal limits will improve Outcome: Progressing Goal: Will remain free from infection Outcome: Progressing Goal: Diagnostic test results will improve Outcome: Progressing Goal: Respiratory complications will improve Outcome: Progressing Goal: Cardiovascular complication will be avoided Outcome: Progressing   Problem: Activity: Goal: Risk for activity intolerance will decrease Outcome: Progressing   Problem: Nutrition: Goal: Adequate nutrition will be maintained Outcome: Progressing   Problem: Coping: Goal: Level of anxiety will decrease Outcome: Progressing   Problem: Elimination: Goal: Will not experience complications related to bowel motility Outcome: Progressing Goal: Will not experience complications related to urinary retention Outcome: Progressing   Problem: Pain Managment: Goal: General experience of comfort will improve and/or be controlled Outcome: Progressing   Problem: Safety: Goal: Ability to remain free from injury will improve Outcome: Progressing   Problem: Skin Integrity: Goal: Risk for impaired skin integrity will decrease Outcome: Progressing   Problem: Fluid Volume: Goal: Hemodynamic stability will improve Outcome: Progressing   Problem: Clinical Measurements: Goal: Diagnostic test results will improve Outcome: Progressing Goal: Signs and symptoms of infection will decrease Outcome: Progressing   Problem: Respiratory: Goal: Ability to maintain adequate ventilation will improve Outcome: Progressing   Problem:  Education: Goal: Ability to describe self-care measures that may prevent or decrease complications (Diabetes Survival Skills Education) will improve Outcome: Progressing Goal: Individualized Educational Video(s) Outcome: Progressing   Problem: Coping: Goal: Ability to adjust to condition or change in health will improve Outcome: Progressing   Problem: Fluid Volume: Goal: Ability to maintain a balanced intake and output will improve Outcome: Progressing   Problem: Health Behavior/Discharge Planning: Goal: Ability to identify and utilize available resources and services will improve Outcome: Progressing Goal: Ability to manage health-related needs will improve Outcome: Progressing   Problem: Metabolic: Goal: Ability to maintain appropriate glucose levels will improve Outcome: Progressing   Problem: Nutritional: Goal: Maintenance of adequate nutrition will improve Outcome: Progressing Goal: Progress toward achieving an optimal weight will improve Outcome: Progressing   Problem: Skin Integrity: Goal: Risk for impaired skin integrity will decrease Outcome: Progressing   Problem: Tissue Perfusion: Goal: Adequacy of tissue perfusion will improve Outcome: Progressing   Problem: Education: Goal: Understanding of CV disease, CV risk reduction, and recovery process will improve Outcome: Progressing Goal: Individualized Educational Video(s) Outcome: Progressing   Problem: Activity: Goal: Ability to return to baseline activity level will improve Outcome: Progressing   Problem: Cardiovascular: Goal: Ability to achieve and maintain adequate cardiovascular perfusion will improve Outcome: Progressing Goal: Vascular access site(s) Level 0-1 will be maintained Outcome: Progressing   Problem: Health Behavior/Discharge Planning: Goal: Ability to safely manage health-related needs after discharge will improve Outcome: Progressing

## 2023-10-04 NOTE — Plan of Care (Signed)
  Problem: Clinical Measurements: Goal: Ability to maintain clinical measurements within normal limits will improve Outcome: Progressing Goal: Will remain free from infection Outcome: Progressing Goal: Respiratory complications will improve Outcome: Progressing Goal: Cardiovascular complication will be avoided Outcome: Progressing   Problem: Coping: Goal: Level of anxiety will decrease Outcome: Progressing   Problem: Activity: Goal: Risk for activity intolerance will decrease Outcome: Not Progressing

## 2023-10-04 NOTE — Consult Note (Signed)
 PHARMACY - ANTICOAGULATION CONSULT NOTE  Pharmacy Consult for IV Heparin Indication: chest pain/ACS  Patient Measurements: Weight: 99.9 kg (220 lb 3.8 oz) HDW: 65 kg  Labs: Recent Labs    10/02/23 0600 10/03/23 0222 10/03/23 0421 10/03/23 0935 10/03/23 1018 10/03/23 1853 10/04/23 0441  HGB 11.2* 10.9*  --   --   --   --  10.3*  HCT 34.2* 32.4*  --   --   --   --  30.4*  PLT 108* 107*  --   --   --   --  115*  APTT  --   --   --   --  35  --   --   LABPROT  --   --   --   --  15.1  --   --   INR  --   --   --   --  1.2  --   --   HEPARINUNFRC  --   --   --   --   --  0.10* 0.18*  CREATININE 5.13* 6.23*  --   --   --   --  7.42*  TROPONINIHS  --  6,721* 9,604* 5,122*  --   --   --     Estimated Creatinine Clearance: 6.8 mL/min (A) (by C-G formula based on SCr of 7.42 mg/dL (H)).   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 Troponin checked 4/16 and profoundly elevated. Concern for ACS, pharmacy consulted to manage heparin infusion.  Baseline aPTT 35s, INR 1.2. CBC notable for stable anemia / mild thrombocytopenia  Goal of Therapy:  Heparin level 0.3-0.7 units/ml Monitor platelets by anticoagulation protocol: Yes  04/16@1853 : HL 0.10, subtherapeutic 04/17 0441 HL 0.18, subtherapeutic 0417 1200 heparin stopped for procedure 0417 1945 resume heparin drip 2 hrs post ban removal (removed@1745 )      @1250  units/hr   Plan:  --resume heparin infusion rate at 1250 units/hr, to stop 4/18 @ 1100 --Check HL in 8 hours --Daily CBC per protocol while on IV heparin  Didi Ganaway PharmD Clinical  Pharmacist 10/04/2023

## 2023-10-04 NOTE — Interval H&P Note (Signed)
 Was at history and Physical Interval Note:  10/04/2023 1:07 PM  Amy Wu  has presented today for surgery, with the diagnosis of non ST segment elevation myocardial infarction.  The various methods of treatment have been discussed with the patient and family. After consideration of risks, benefits and other options for treatment, the patient has consented to  Procedure(s): LEFT HEART CATH AND CORONARY ANGIOGRAPHY (N/A)  PERCUTANEOUS CORONARY INTERVENTION   as a surgical intervention.  The patient's history has been reviewed, patient examined, no change in status, stable for surgery.  I have reviewed the patient's chart and labs.  Questions were answered to the patient's satisfaction.    Cath Lab Visit (complete for each Cath Lab visit)  Clinical Evaluation Leading to the Procedure:   ACS: Yes.    Non-ACS:    Anginal Classification: No Symptoms  Anti-ischemic medical therapy: Minimal Therapy (1 class of medications)  Non-Invasive Test Results: No non-invasive testing performed  Prior CABG: No previous CABG    Randene Bustard

## 2023-10-04 NOTE — Progress Notes (Signed)
 [facility]  ROUNDING NOTE   Subjective:   Amy Wu  is a 73 y.o.  female with past medical history of hypertension, diabetes, PVD, dyslipidemia, and end-stage renal disease on dialysis. She presents to the ED via EMS for evaluation of altered mental status and fever and has been admitted under observation for Acute respiratory failure with hypoxia (HCC) [J96.01] Sepsis (HCC) [A41.9] Sepsis, due to unspecified organism, unspecified whether acute organ dysfunction present [facility]) [A41.9]  Patient is known to our practice and receives outpatient dialysis treatments at [facility] on a MWF schedule, supervised by [facility] physicians.   Patient sitting up in bed 4L [place] Denies shortness of breath Pressors: stopped Heparin drip  NPO for cardiac cath   Objective:  Vital signs in last 24 hours:  Temp:  [97.5 F (36.4 C)-98 F (36.7 C)] 97.9 F (36.6 C) (04/17 0800) Pulse Rate:  [31-96] 58 (04/17 1000) Resp:  [0-22] 12 (04/17 1000) BP: (81-147)/(40-77) 133/50 (04/17 1000) SpO2:  [77 %-100 %] 97 % (04/17 1000) FiO2 (%):  [30 %] 30 % (04/17 0400) Weight:  [99.9 kg] 99.9 kg (04/17 0500)  Weight change: 0 kg Filed Weights   10/02/23 0511 10/02/23 1755 10/04/23 0500  Weight: 99.9 kg 99.9 kg 99.9 kg    Intake/Output: I/O last 3 completed shifts: In: 931 [P.O.:370; I.V.:361; IV Piggyback:200] Out: 1500 [Other:1500]   Intake/Output this shift:  No intake/output data recorded.  Physical Exam: General: NAD  Head: Normocephalic, atraumatic.   Eyes: Anicteric  Lungs:  Diminished, [place] O2  Heart: Regular rate and rhythm  Abdomen:  Soft, nontender  Extremities:  Trace Rt stump peripheral edema. Bilat AKA  Neurologic: Alert, moving all four extremities  Skin: No lesions  Access: Rt thigh permcath (removed on 4/15)    Basic Metabolic Panel: Recent Labs  Lab 10/01/23 0912 10/02/23 0600 10/03/23 0222 10/04/23 0441  NA 138 133* 133* 133*  K 3.0* 3.5 3.3* 3.3*   CL 101 98 97* 98  CO2 23 24 21* 23  GLUCOSE 180* 147* 148* 155*  BUN 31* 23 32* 40*  CREATININE 7.42* 5.13* 6.23* 7.42*  CALCIUM 8.6* 8.6* 8.3* 8.2*  MG  --   --  1.9 2.1  PHOS  --   --  4.0 4.3    Liver Function Tests: Recent Labs  Lab 10/01/23 0912 10/02/23 0600  AST 25 81*  ALT 7 16  ALKPHOS 70 68  BILITOT 0.9 1.0  PROT 6.7 6.3*  ALBUMIN 3.1* 2.9*   No results for input(s): "LIPASE", "AMYLASE" in the last 168 hours. Recent Labs  Lab 10/01/23 2221  AMMONIA 12    CBC: Recent Labs  Lab 10/01/23 0912 10/02/23 0600 10/03/23 0222 10/04/23 0441  WBC 12.3* 8.1 5.2 7.1  NEUTROABS 10.7*  --   --   --   HGB 11.6* 11.2* 10.9* 10.3*  HCT 35.9* 34.2* 32.4* 30.4*  MCV 81.2 79.5* 78.8* 76.4*  PLT 105* 108* 107* 115*    Cardiac Enzymes: No results for input(s): "CKTOTAL", "CKMB", "CKMBINDEX", "TROPONINI" in the last 168 hours.  BNP: Invalid input(s): "POCBNP"  CBG: Recent Labs  Lab 10/03/23 0743 10/03/23 1140 10/03/23 1612 10/03/23 2204 10/04/23 0732  GLUCAP 183* 165* 113* 174* 160*    Microbiology: Results for orders placed or performed during the hospital encounter of 10/01/23  Resp panel by RT-PCR (RSV, Flu A&B, Covid) Anterior Nasal Swab     Status: None   Collection Time: 10/01/23  9:12 AM  Specimen: Anterior Nasal Swab  Result Value Ref Range Status   SARS Coronavirus 2 by RT PCR NEGATIVE NEGATIVE Final    Comment: (NOTE) SARS-CoV-2 target nucleic acids are NOT DETECTED.  The SARS-CoV-2 RNA is generally detectable in upper respiratory specimens during the acute phase of infection. The lowest concentration of SARS-CoV-2 viral copies this assay can detect is 138 copies/mL. A negative result does not preclude SARS-Cov-2 infection and should not be used as the sole basis for treatment or other patient management decisions. A negative result may occur with  improper specimen collection/handling, submission of specimen other than nasopharyngeal swab,  presence of viral mutation(s) within the areas targeted by this assay, and inadequate number of viral copies(<138 copies/mL). A negative result must be combined with clinical observations, patient history, and epidemiological information. The expected result is Negative.  Fact Sheet for Patients:  BloggerCourse.com  Fact Sheet for Healthcare Providers:  SeriousBroker.it  This test is no t yet approved or cleared by the [place] FDA and  has been authorized for detection and/or diagnosis of SARS-CoV-2 by FDA under an Emergency Use Authorization (EUA). This EUA will remain  in effect (meaning this test can be used) for the duration of the COVID-19 declaration under Section 564(b)(1) of the Act, 21 U.S.C.section 360bbb-3(b)(1), unless the authorization is terminated  or revoked sooner.       Influenza A by PCR NEGATIVE NEGATIVE Final   Influenza B by PCR NEGATIVE NEGATIVE Final    Comment: (NOTE) The Xpert Xpress SARS-CoV-2/FLU/RSV plus assay is intended as an aid in the diagnosis of influenza from Nasopharyngeal swab specimens and should not be used as a sole basis for treatment. Nasal washings and aspirates are unacceptable for Xpert Xpress SARS-CoV-2/FLU/RSV testing.  Fact Sheet for Patients: BloggerCourse.com  Fact Sheet for Healthcare Providers: SeriousBroker.it  This test is not yet approved or cleared by the [place] FDA and has been authorized for detection and/or diagnosis of SARS-CoV-2 by FDA under an Emergency Use Authorization (EUA). This EUA will remain in effect (meaning this test can be used) for the duration of the COVID-19 declaration under Section 564(b)(1) of the Act, 21 U.S.C. section 360bbb-3(b)(1), unless the authorization is terminated or revoked.     Resp Syncytial Virus by PCR NEGATIVE NEGATIVE Final    Comment: (NOTE) Fact Sheet for  Patients: BloggerCourse.com  Fact Sheet for Healthcare Providers: SeriousBroker.it  This test is not yet approved or cleared by the [place] FDA and has been authorized for detection and/or diagnosis of SARS-CoV-2 by FDA under an Emergency Use Authorization (EUA). This EUA will remain in effect (meaning this test can be used) for the duration of the COVID-19 declaration under Section 564(b)(1) of the Act, 21 U.S.C. section 360bbb-3(b)(1), unless the authorization is terminated or revoked.  Performed at [facility], [address] [place], [place] [address]   Blood Culture (routine x 2)     Status: Abnormal   Collection Time: 10/01/23  9:12 AM   Specimen: BLOOD  Result Value Ref Range Status   Specimen Description   Final    BLOOD BLOOD LEFT FOREARM Performed at [facility], [address] [place], [place] [address]    Special Requests   Final    BOTTLES DRAWN AEROBIC AND ANAEROBIC Blood Culture results may not be optimal due to an inadequate volume of blood received in culture bottles Performed at [facility], [address], [place], [place] [address]    Culture  Setup  Time   Final    GRAM NEGATIVE RODS IN BOTH AEROBIC AND ANAEROBIC BOTTLES CRITICAL VALUE NOTED.  VALUE IS CONSISTENT WITH PREVIOUSLY REPORTED AND CALLED VALUE. Performed at [facility], [address] [place], [place] [address]    Culture (A)  Final    SERRATIA MARCESCENS SUSCEPTIBILITIES PERFORMED ON PREVIOUS CULTURE WITHIN THE LAST 5 DAYS. Performed at [facility], [address], [place], [place] [address]    Report Status 10/04/2023 FINAL  Final  Respiratory (~20 pathogens) panel by PCR     Status: None   Collection Time: 10/01/23  9:12 AM   Specimen: Nasopharyngeal Swab; Respiratory  Result Value Ref Range Status   Adenovirus NOT DETECTED NOT DETECTED Final   Coronavirus 229E NOT DETECTED NOT  DETECTED Final    Comment: (NOTE) The Coronavirus on the Respiratory Panel, DOES NOT test for the novel  Coronavirus (2019 nCoV)    Coronavirus HKU1 NOT DETECTED NOT DETECTED Final   Coronavirus NL63 NOT DETECTED NOT DETECTED Final   Coronavirus OC43 NOT DETECTED NOT DETECTED Final   Metapneumovirus NOT DETECTED NOT DETECTED Final   Rhinovirus / Enterovirus NOT DETECTED NOT DETECTED Final   Influenza A NOT DETECTED NOT DETECTED Final   Influenza B NOT DETECTED NOT DETECTED Final   Parainfluenza Virus 1 NOT DETECTED NOT DETECTED Final   Parainfluenza Virus 2 NOT DETECTED NOT DETECTED Final   Parainfluenza Virus 3 NOT DETECTED NOT DETECTED Final   Parainfluenza Virus 4 NOT DETECTED NOT DETECTED Final   Respiratory Syncytial Virus NOT DETECTED NOT DETECTED Final   Bordetella pertussis NOT DETECTED NOT DETECTED Final   Bordetella Parapertussis NOT DETECTED NOT DETECTED Final   Chlamydophila pneumoniae NOT DETECTED NOT DETECTED Final   Mycoplasma pneumoniae NOT DETECTED NOT DETECTED Final    Comment: Performed at [facility], [address], [place], [place] [address]  Blood Culture (routine x 2)     Status: Abnormal   Collection Time: 10/01/23  9:33 AM   Specimen: BLOOD  Result Value Ref Range Status   Specimen Description   Final    BLOOD RIGHT WRIST Performed at [facility], [address] [place], [place] [address]    Special Requests   Final    BOTTLES DRAWN AEROBIC AND ANAEROBIC Blood Culture adequate volume Performed at [facility] [address] [place], [place] [address]    Culture  Setup Time   Final    GRAM NEGATIVE RODS IN BOTH AEROBIC AND ANAEROBIC BOTTLES CRITICAL RESULT CALLED TO, READ BACK BY AND VERIFIED WITH:  NATHAN BELUE AT 0457 10/02/23 JG Performed at [facility], [address], [place], [place] [address]    Culture SERRATIA MARCESCENS (A)  Final   Report Status 10/04/2023 FINAL  Final   Organism ID, Bacteria SERRATIA  MARCESCENS  Final      Susceptibility   Serratia marcescens - MIC*    CEFEPIME <=0.12 SENSITIVE Sensitive     CEFTAZIDIME <=1 SENSITIVE Sensitive     CEFTRIAXONE <=0.25 SENSITIVE Sensitive     CIPROFLOXACIN <=0.25 SENSITIVE Sensitive     GENTAMICIN <=1 SENSITIVE Sensitive     TRIMETH/SULFA <=20 SENSITIVE Sensitive     * SERRATIA MARCESCENS  Blood Culture ID Panel (Reflexed)     Status: Abnormal   Collection Time: 10/01/23  9:33 AM  Result Value Ref Range Status   Enterococcus faecalis NOT DETECTED NOT DETECTED Final   Enterococcus Faecium NOT DETECTED NOT DETECTED Final  Listeria monocytogenes NOT DETECTED NOT DETECTED Final   Staphylococcus species NOT DETECTED NOT DETECTED Final   Staphylococcus aureus (BCID) NOT DETECTED NOT DETECTED Final   Staphylococcus epidermidis NOT DETECTED NOT DETECTED Final   Staphylococcus lugdunensis NOT DETECTED NOT DETECTED Final   Streptococcus species NOT DETECTED NOT DETECTED Final   Streptococcus agalactiae NOT DETECTED NOT DETECTED Final   Streptococcus pneumoniae NOT DETECTED NOT DETECTED Final   Streptococcus pyogenes NOT DETECTED NOT DETECTED Final   A.calcoaceticus-baumannii NOT DETECTED NOT DETECTED Final   Bacteroides fragilis NOT DETECTED NOT DETECTED Final   Enterobacterales DETECTED (A) NOT DETECTED Final    Comment: Enterobacterales represent a large order of gram negative bacteria, not a single organism. CRITICAL RESULT CALLED TO, READ BACK BY AND VERIFIED WITH:  NATHAN BELUE AT 0457 10/02/23 JG    Enterobacter cloacae complex NOT DETECTED NOT DETECTED Final   Escherichia coli NOT DETECTED NOT DETECTED Final   Klebsiella aerogenes NOT DETECTED NOT DETECTED Final   Klebsiella oxytoca NOT DETECTED NOT DETECTED Final   Klebsiella pneumoniae NOT DETECTED NOT DETECTED Final   Proteus species NOT DETECTED NOT DETECTED Final   Salmonella species NOT DETECTED NOT DETECTED Final   Serratia marcescens DETECTED (A) NOT DETECTED Final     Comment: CRITICAL RESULT CALLED TO, READ BACK BY AND VERIFIED WITH:  NATHAN BELUE AT 0457 10/02/23 JG    Haemophilus influenzae NOT DETECTED NOT DETECTED Final   Neisseria meningitidis NOT DETECTED NOT DETECTED Final   Pseudomonas aeruginosa NOT DETECTED NOT DETECTED Final   Stenotrophomonas maltophilia NOT DETECTED NOT DETECTED Final   Candida albicans NOT DETECTED NOT DETECTED Final   Candida auris NOT DETECTED NOT DETECTED Final   Candida glabrata NOT DETECTED NOT DETECTED Final   Candida krusei NOT DETECTED NOT DETECTED Final   Candida parapsilosis NOT DETECTED NOT DETECTED Final   Candida tropicalis NOT DETECTED NOT DETECTED Final   Cryptococcus neoformans/gattii NOT DETECTED NOT DETECTED Final   CTX-M ESBL NOT DETECTED NOT DETECTED Final   Carbapenem resistance IMP NOT DETECTED NOT DETECTED Final   Carbapenem resistance KPC NOT DETECTED NOT DETECTED Final   Carbapenem resistance NDM NOT DETECTED NOT DETECTED Final   Carbapenem resist OXA 48 LIKE NOT DETECTED NOT DETECTED Final   Carbapenem resistance VIM NOT DETECTED NOT DETECTED Final    Comment: Performed at [facility] [address] [place], [place] [address]  MRSA Next Gen by PCR, Nasal     Status: None   Collection Time: 10/02/23  5:26 PM   Specimen: Nasal Mucosa; Nasal Swab  Result Value Ref Range Status   MRSA by PCR Next Gen NOT DETECTED NOT DETECTED Final    Comment: (NOTE) The GeneXpert MRSA Assay (FDA approved for NASAL specimens only), is one component of a comprehensive MRSA colonization surveillance program. It is not intended to diagnose MRSA infection nor to guide or monitor treatment for MRSA infections. Test performance is not FDA approved in patients less than 48 years old. Performed at [facility], [address], [place], [place] [address]   Cath Tip Culture     Status: Abnormal (Preliminary result)   Collection Time: 10/02/23  9:28 PM   Specimen: Catheter Tip; Other  Result  Value Ref Range Status   Specimen Description   Final    CATH TIP Performed at [facility], [address], [place], [place] [address]    Special Requests   Final    NONE Performed at [facility],  [address], [place], [place] [address]    Culture (A)  Final    SERRATIA MARCESCENS SUSCEPTIBILITIES TO FOLLOW Performed at [facility], [address] [place], [place] [address]    Report Status PENDING  Incomplete  Culture, blood (Routine X 2) w Reflex to ID Panel     Status: None (Preliminary result)   Collection Time: 10/03/23  1:00 PM   Specimen: BLOOD  Result Value Ref Range Status   Specimen Description BLOOD BLOOD LEFT WRIST  Final   Special Requests   Final    BOTTLES DRAWN AEROBIC ONLY Blood Culture adequate volume   Culture   Final    NO GROWTH < 24 HOURS Performed at [facility], [address], [place], [place] [address]    Report Status PENDING  Incomplete  Culture, blood (Routine X 2) w Reflex to ID Panel     Status: None (Preliminary result)   Collection Time: 10/03/23  1:04 PM   Specimen: BLOOD  Result Value Ref Range Status   Specimen Description BLOOD BLOOD LEFT FOREARM  Final   Special Requests   Final    BOTTLES DRAWN AEROBIC ONLY Blood Culture adequate volume   Culture   Final    NO GROWTH < 24 HOURS Performed at [facility], [address], [place], [place] [address]    Report Status PENDING  Incomplete    Coagulation Studies: Recent Labs    10/03/23 1018  LABPROT 15.1  INR 1.2    Urinalysis: No results for input(s): "COLORURINE", "LABSPEC", "PHURINE", "GLUCOSEU", "HGBUR", "BILIRUBINUR", "KETONESUR", "PROTEINUR", "UROBILINOGEN", "NITRITE", "LEUKOCYTESUR" in the last 72 hours.  Invalid input(s): "APPERANCEUR"    Imaging: No results found.    Medications:    sodium chloride Stopped (10/04/23 2956)   ceFEPime (MAXIPIME) IV Stopped (10/03/23 2230)   heparin 1,250 Units/hr (10/04/23  0734)    Chlorhexidine Gluconate Cloth  6 each Topical Q0600   insulin aspart  0-9 Units Subcutaneous TID WC   midodrine  5 mg Oral TID WC   acetaminophen, gabapentin, Gerhardt's butt cream, ondansetron **OR** ondansetron (ZOFRAN) IV  Assessment/ Plan:  Amy Wu is a 73 y.o.  female with past medical history of hypertension, diabetes, PVD, dyslipidemia, and end-stage renal disease on dialysis. She presents to the ED via EMS for evaluation of altered mental status and fever and has been admitted under observation for Acute respiratory failure with hypoxia (HCC) [J96.01] Sepsis (HCC) [A41.9] Sepsis, due to unspecified organism, unspecified whether acute organ dysfunction present (HCC) [A41.9]  [facility]/MWF/Rt thigh Permcath  End stage renal disease on hemodialysis.  Last dialysis received for 4/15, line removed for line holiday after treatment. Serologies and clinical exam stable today. No urgent need for dialysis. Will monitor. Will proceed with dialysis once access replaced.   Sepsis, unknown source at this time. Admitted with fever 103, and elevated white count, 12.2. Blood culture positive for serratia marcescens. ID request removal of tunneled catheter, removed on 4/15 with tip sent for culture. Catheter positive for same bacteria. Currently prescribed azithromycin, cefepime and levaquin.  Repeat blood cultures drawn on 4/16.  3. Acute respiratory failure requiring Bipap. Volume overload vs pneumonia. Weaned to nasal cannula.  4. Anemia of chronic kidney disease Lab Results  Component Value Date   HGB 10.3 (L) 10/04/2023    No acute indication for ESA   5. Secondary Hyperparathyroidism: with outpatient labs: None available   Lab Results  Component Value Date  CALCIUM 8.2 (L) 10/04/2023   PHOS 4.3 10/04/2023     Currently prescribed calcitriol and cinacalcet outpatient.  Calcium and phosphorus stable   LOS: 2 Purvi Ruehl 4/17/202510:49 AM

## 2023-10-04 NOTE — Hospital Course (Addendum)
 Hospital course / significant events:   HPI:  Amy Wu is a 73 y.o. female with medical history significant of end-stage renal disease on hemodialysis MWF, type 2 diabetes, hyperlipidemia, peripheral vascular disease presenting to ED 10/01/23 from home with generalized malaise and fevers, confusion per family, mild cough/SOB. No abdominal pain but some diarrhea. Missed hemodialysis 04/13 secondary to symptoms.   04/14: admitted to hospitalist service for sepsis as well as acute resp fail w/ hypoxia and concern for volume overload. Requiring BiPAP  04/15: (+)bacteremia w/ serratia, abx to cefepime . Pt transferred to the stepdown unit for pressors. ID consult, HD catheter removed due to likely source of sepsis. Elevated troponins, started heparin   04/16: Off levo later today and onto scheduled midodrine . Echo ordered. Cardiology consulted, continue heparin  infusion, not on beta-blocker secondary to bradycardia, scheduled for cardiac cath on 04/17. Transthoracic echo ok. Repeating BCx today. Plan reinsertion HD cath 04/18.  04/17: LHC today. Significant CAD, no PCI 04/18: L femoral dialysis cath placed, planning dialysis tomorrow. Glc substantial increase, adjusting sliding scale  04/19: New onset atrial flutter, cardiology following, started heparin  and amiodarone . Dialysis today. Still quite hyperglycemic.  04/20: converted to sinus. Son unable to get meds from pharmacy, pt will need Eliquis  - will plan dialysis tomorrow, transition to eliquis  and confirm home meds access and DME, hopefully will be able to dc tomorrow  04/21: hospital bed will be delivered tomorrow and [facility] can transport in AM, anticipate home tomorrow.  04/22: significant confusion this morning, oriented but "not right." Workup negative, exam not concerning for CVA, CT nonacute, changing cefepime  to levaquin  and hopefully this will help.    Consultants:  Infectious disease Vascular surgery   [facility] Cardiology  Procedures/Surgeries: 10/04/23 left heart cath   10/05/23 placement permcath - tunneled hemodialysis catheter via the left femoral vein       ASSESSMENT & PLAN:   Metabolic encephalopathy likely effect of cefepime  Likely component of vascular dementia  Changing to levaquin  per ID recs, start tomorrow   Severe Sepsis w/ Serratia bacteremia likely source HD catheter which has been removed Complicated by septic shock, see below  s/p HD catheter placement 10/05/23 following neg repeat BCx Cefepime  changed to levaquin  per ID  Septic shock requiring vasopressor support, shock resolved and now off IV pressors Treat underlying cause(s) as noted midodrine   New atrial flutter New onset this morning 04/19 Documented on EKG today Rate controlled in the 70s Asymptomatic amiodarone  200 twice daily x 2 weeks then down to 200 daily   Eliquis    Elevated troponin secondary NSTEMI vs. demand ischemia  Coronary Artery Disease w/ extensive calcification / atherosclerotic disease  Hx: HTN, HLD and PVD Echo 10/02/23: EF >55%; mild tricuspid valve regurgitation; trivial pulmonic valve regurgitation  Cardiac catheterization was attempted 10/04/23 but was very challenging due to difficult vascular anatomy. Only the right coronary artery could be engaged and was found to have sequential 80 to 90% lesions.  No ASA on Eliquis  atorvastatin   Beta blocker has caused bradycardia/hypotension, defer this medication    Acute hypoxic respiratory failure - improved  Likely d/t pulmonary vascular congestion d/t missed dialysis Supplemental O2 for dyspnea and/or hypoxia  Maintain O2 sats 92% or higher  Fluid removal with HD     ESRD on HD (M-W-F) Hyponatremia s/p HD catheter placement 10/05/23 Trend BMP per nephrology / as needed  Replace electrolytes as indicated  Nephrology consulted appreciate input   Elevated AST  Trend hepatic function panel  Avoid hepatotoxic agents  as able     Type II diabetes mellitus  PT had previously been d/c insulin  outpatient d/t severe hypoglycemia, was normoglycemic initially and A1C <7 however Glc past few days has been quite high, diet has been non-carb-controlled, question d/t steroid dose, has been coming down  CBG's ac/hs/prn diet to renal/carb   SSI decreased  Target CBG's 140 to 180     Class 3 obesity based on BMI: Body mass index is 47.66 kg/m.  Underweight - under 18  overweight - 25 to 29 obese - 30 or more Class 1 obesity: BMI of 30.0 to 34 Class 2 obesity: BMI of 35.0 to 39 Class 3 obesity: BMI of 40.0 to 49 Super Morbid Obesity: BMI 50-59 Super-super Morbid Obesity: BMI 60+ Significantly low or high BMI is associated with higher medical risk.  Weight management advised as adjunct to other disease management and risk reduction treatments    DVT prophylaxis: on heparin  gtt  IV fluids: no continuous IV fluids  Nutrition: NPO pending cardiac cath Central lines / other devices: none  Code Status: DNR ACP documentation reviewed:  none on file in [facility]  TOC needs: [facility] transport, DME  Medical barriers to dispo: encephalopathic this morning, expect dc tomorrow if mentally a bit more clear / after HD and once DME in place and [facility] can transort home

## 2023-10-04 NOTE — Progress Notes (Signed)
 Cardiology Progress Note   Patient Name: Amy Wu Date of Encounter: 10/04/2023  Primary Cardiologist: Julien Nordmann, MD  Subjective   More alert this AM, in good spirits.  No c/p or sob. NPO for cath today.  Questions answered. Objective   Inpatient Medications    Scheduled Meds:  Chlorhexidine Gluconate Cloth  6 each Topical Q0600   insulin aspart  0-9 Units Subcutaneous TID WC   midodrine  5 mg Oral TID WC   Continuous Infusions:  sodium chloride Stopped (10/04/23 0619)   ceFEPime (MAXIPIME) IV Stopped (10/03/23 2230)   heparin 1,250 Units/hr (10/04/23 0734)   PRN Meds: acetaminophen, gabapentin, Gerhardt's butt cream, ondansetron **OR** ondansetron (ZOFRAN) IV   Vital Signs    Vitals:   10/04/23 0700 10/04/23 0800 10/04/23 0900 10/04/23 1000  BP:  (!) 106/46 (!) 129/51 (!) 133/50  Pulse: 67 (!) 56 64 (!) 58  Resp: (!) 9 20 11 12   Temp:  97.9 F (36.6 C)    TempSrc:  Axillary    SpO2: 100% 98% 100% 97%  Weight:        Intake/Output Summary (Last 24 hours) at 10/04/2023 1147 Last data filed at 10/04/2023 0523 Gross per 24 hour  Intake 530.9 ml  Output 0 ml  Net 530.9 ml   Filed Weights   10/02/23 0511 10/02/23 1755 10/04/23 0500  Weight: 99.9 kg 99.9 kg 99.9 kg    Physical Exam   GEN: Obese, in no acute distress.  HEENT: Grossly normal.  Neck: Supple, no JVD, carotid bruits, or masses. Cardiac: RRR, no murmurs, rubs, or gallops. No clubbing, cyanosis, edema. Bilat AKA. Respiratory:  Respirations regular and unlabored, clear to auscultation bilaterally. GI: Soft, nontender, nondistended, BS + x 4. MS: no deformity or atrophy. Skin: warm and dry, no rash. Neuro:  Strength and sensation are intact. Psych: AAOx3.  Normal affect.  Labs    Chemistry Recent Labs  Lab 10/01/23 0912 10/02/23 0600 10/03/23 0222 10/04/23 0441  NA 138 133* 133* 133*  K 3.0* 3.5 3.3* 3.3*  CL 101 98 97* 98  CO2 23 24 21* 23  GLUCOSE 180* 147* 148* 155*  BUN  31* 23 32* 40*  CREATININE 7.42* 5.13* 6.23* 7.42*  CALCIUM 8.6* 8.6* 8.3* 8.2*  PROT 6.7 6.3*  --   --   ALBUMIN 3.1* 2.9*  --   --   AST 25 81*  --   --   ALT 7 16  --   --   ALKPHOS 70 68  --   --   BILITOT 0.9 1.0  --   --   GFRNONAA 5* 8* 7* 5*  ANIONGAP 14 11 15 12      Hematology Recent Labs  Lab 10/02/23 0600 10/03/23 0222 10/04/23 0441  WBC 8.1 5.2 7.1  RBC 4.30 4.11 3.98  HGB 11.2* 10.9* 10.3*  HCT 34.2* 32.4* 30.4*  MCV 79.5* 78.8* 76.4*  MCH 26.0 26.5 25.9*  MCHC 32.7 33.6 33.9  RDW 17.3* 17.5* 17.4*  PLT 108* 107* 115*    Cardiac Enzymes  Recent Labs  Lab 10/03/23 0222 10/03/23 0421 10/03/23 0935  TROPONINIHS 6,721* 6,948* 5,122*      BNP    Component Value Date/Time   BNP 1,436.1 (H) 10/01/2023 0912   Lipids  Lab Results  Component Value Date   CHOL 90 10/03/2023   HDL 16 (L) 10/03/2023   LDLCALC 44 10/03/2023   TRIG 148 10/03/2023   CHOLHDL 5.6 10/03/2023  HbA1c  Lab Results  Component Value Date   HGBA1C 6.5 (H) 10/01/2023    Radiology    DG Chest Port 1 View Result Date: 10/01/2023 CLINICAL DATA:  Questionable sepsis - evaluate for abnormality Altered mental status.  Fever.  Hypoxemia. EXAM: PORTABLE CHEST 1 VIEW COMPARISON:  Radiographs 05/23/2021 and 07/20/2020. FINDINGS: 0932 hours. Lower lung volumes with probable vascular congestion and mildly increased left basilar atelectasis. No overt pulmonary edema or confluent airspace disease. There may be a small left pleural effusion. The heart size and mediastinal contours appears stable with aortic atherosclerosis. No acute osseous findings are seen. Telemetry leads overlie the chest. IMPRESSION: Lower lung volumes with probable vascular congestion and mildly increased left basilar atelectasis. No definite evidence of pneumonia. Electronically Signed   By: Elmon Hagedorn M.D.   On: 10/01/2023 10:12     Telemetry    Rsr, 1st deg avb - Personally Reviewed  Cardiac Studies   2D  Echocardiogram 4.16.2025   1. Left ventricular ejection fraction, by estimation, is >55%. The left  ventricle has normal function. Left ventricular endocardial border not  optimally defined to evaluate regional wall motion. There is mild left  ventricular hypertrophy. Left  ventricular diastolic parameters are indeterminate.   2. Right ventricular systolic function is low normal. The right  ventricular size is normal.   3. The mitral valve is normal in structure. No evidence of mitral valve  regurgitation. No evidence of mitral stenosis.   4. The aortic valve is tricuspid. There is moderate thickening of the  aortic valve. Aortic valve regurgitation is not visualized. Aortic valve  sclerosis is present, with no evidence of aortic valve stenosis.  _____________   Patient Profile     73 y.o. female with history of end-stage renal disease on hemodialysis, diabetes, hyperlipidemia, PAD status post bilateral AKA, and obesity, who was admitted April 15 with sepsis, bacteremia, hypotension, and non-STEMI with peak troponin of 6948.  Assessment & Plan    1.  Non-STEMI: Patient admitted April 15 with bacteremia, sepsis, fever, hypoxia, tachycardia, and hypotension initially requiring vasopressor therapy.  At time she had bradycardia in the 30s to 50s following a dose of Zanaflex.  Troponin peaked at 6948.  Echo shows normal LV function.  Patient more alert this morning and has not required vasopressors.  She is currently n.p.o. with plan for diagnostic catheterization.  Continue aspirin.  Statin initially held in the setting of elevated AST -likely secondary to hypotension.  Plan to resume.  No beta-blocker in the setting of hypotension requiring midodrine.  2.  Septic shock/Serratia bacteremia: Felt to be secondary to HD catheter which has since been removed.  Hemodynamically stable on midodrine this morning.  Antibiotics per primary team/infectious disease.  3.  End-stage renal disease on  hemodialysis: Status post HD catheter removal with plan for temporary access placement and resumption of dialysis per nephrology and vascular surgery.  4.  Acute hypoxic respiratory failure: Patient saturating in the high 90s on 2 L of oxygen.  Lungs are clear today.  Signed, Amy Pintos, NP  10/04/2023, 11:47 AM    For questions or updates, please contact   Please consult [email or web address] for contact info under Cardiology/STEMI.

## 2023-10-04 NOTE — Progress Notes (Addendum)
 PROGRESS NOTE    [name]   ZOX:[id number] DOB: December 06, 1950  DOA: 10/01/2023 Date of Service: 10/04/23 which is hospital day 2  PCP: Clovis Pu, Arturo Morton, [facility] course / significant events:   HPI:  Amy Wu is a 73 y.o. female with medical history significant of end-stage renal disease on hemodialysis MWF, type 2 diabetes, hyperlipidemia, peripheral vascular disease presenting to ED 10/01/23 from home with generalized malaise and fevers, confusion per family, mild cough/SOB. No abdominal pain but some diarrhea. Missed hemodialysis 04/13 secondary to symptoms.   04/14: admitted to hospitalist service for sepsis as well as acute resp fail w/ hypoxia and concern for volume overload. Requiring BiPAP  04/15:  (+)bacteremia w/ serratia, abx to cefepime. Pt transferred to the stepdown unit for pressors. ID consult, HD catheter removed due to likely source of sepsis. Elevated troponins, started heparin  04/16: Off levo later today and onto scheduled midodrine. Echo ordered. Cardiology consulted, continue heparin infusion, not on beta-blocker secondary to bradycardia, scheduled for cardiac cath on 04/17. Transthoracic echo ok. Repeating BCx today. Plan reinsertion HD cath 04/18.  04/17: LHC today.    Consultants:  Infectious disease Vascular surgery  [facility] Cardiology  Procedures/Surgeries: 10/04/23 left heart cath        ASSESSMENT & PLAN:   Acute metabolic encephalopathy d/t severe sepsis  Treat underlying cause(s) as noted Correct metabolic derangements  Avoid sedating medications as able  Maintain sleep/wake cycle    Severe Sepsis w/ Serratia bacteremia likely source HD catheter which has been removed Complicated by septic shock, see below  ID following Cefepime Repeat BCx pending if continued encephalopathy will change to carbapenem per ID recommendations    Septic shock requiring vasopressor support, shock resolved and now off IV pressors Treat underlying  cause(s) as noted midodrine  Elevated troponin secondary NSTEMI vs. demand ischemia  Hx: HLD and PVD Echo 10/02/23: EF >55%; mild tricuspid valve regurgitation; trivial pulmonic valve regurgitation  Continuous telemetry monitoring  Cardiology following Plan for cardiac cath today heparin gtt aspirin and atorvastatin    Acute hypoxic respiratory failure  Likely d/t pulmonary vascular congestion d/t missed dialysis Supplemental O2 for dyspnea and/or hypoxia  Maintain O2 sats 92% or higher  Fluid removal with HD    ESRD on HD (M-W-F) Hyponatremia Trend BMP  Replace electrolytes as indicated  Nephrology consulted appreciate input Vascular surgery consulted appreciate input: line holiday with tentative plan for new HD catheter placement on 10/05/23   Elevated AST  Trend hepatic function panel  Avoid hepatotoxic agents as able    Type II diabetes mellitus  CBG's ac/hs SSI  Target CBG's 140 to 180  Follow hypo/hyperglycemic protocol     Class 3 obesity based on BMI: Body mass index is 47.66 kg/m.  Underweight - under 18  overweight - 25 to 29 obese - 30 or more Class 1 obesity: BMI of 30.0 to 34 Class 2 obesity: BMI of 35.0 to 39 Class 3 obesity: BMI of 40.0 to 49 Super Morbid Obesity: BMI 50-59 Super-super Morbid Obesity: BMI 60+ Significantly low or high BMI is associated with higher medical risk.  Weight management advised as adjunct to other disease management and risk reduction treatments    DVT prophylaxis: on heparin gtt  IV fluids: no continuous IV fluids  Nutrition: NPO pending cardiac cath Central lines / other devices: none  Code Status: DNR ACP documentation reviewed:  none on file in [facility]  TOC needs: TBD Medical barriers  to dispo: bacteremia, NSTEMI, dialysis, needs new HD cath placed. Expected medical readiness for discharge no sooner than 02/21.              Subjective / Brief ROS:  Patient reports tired today but otherwise  ok Denies CP/SOB.  Pain controlled.  Denies new weakness.  Reports no concerns w/ urination/defecation.   Family Communication: spoke on phone w/ son 10/04/23 4:19 PM      Objective Findings:  Vitals:   10/04/23 1445 10/04/23 1500 10/04/23 1515 10/04/23 1530  BP: (!) 164/66 (!) 164/55 94/66 130/66  Pulse: 66 75 95 89  Resp: 12 19 (!) 23 (!) 24  Temp:      TempSrc:      SpO2: 99% 98% 98% 96%  Weight:        Intake/Output Summary (Last 24 hours) at 10/04/2023 1534 Last data filed at 10/04/2023 0523 Gross per 24 hour  Intake 253.13 ml  Output 0 ml  Net 253.13 ml   Filed Weights   10/02/23 0511 10/02/23 1755 10/04/23 0500  Weight: 99.9 kg 99.9 kg 99.9 kg    Examination:  Physical Exam Constitutional:      General: She is not in acute distress. Cardiovascular:     Rate and Rhythm: Normal rate and regular rhythm.  Pulmonary:     Effort: Pulmonary effort is normal.     Breath sounds: Normal breath sounds.  Abdominal:     Palpations: Abdomen is soft.  Skin:    General: Skin is warm and dry.  Neurological:     General: No focal deficit present.     Mental Status: She is alert and oriented to person, place, and time. Mental status is at baseline.  Psychiatric:        Mood and Affect: Mood normal.        Behavior: Behavior normal.          Scheduled Medications:   [MAR Hold] Chlorhexidine Gluconate Cloth  6 each Topical Q0600   [MAR Hold] insulin aspart  0-9 Units Subcutaneous TID WC   [MAR Hold] midodrine  5 mg Oral TID WC   sodium chloride flush  3 mL Intravenous Q12H    Continuous Infusions:  sodium chloride Stopped (10/04/23 0619)   sodium chloride     [MAR Hold] ceFEPime (MAXIPIME) IV Stopped (10/03/23 2230)    PRN Medications:  sodium chloride, [MAR Hold] acetaminophen, diphenhydrAMINE, [MAR Hold] gabapentin, [MAR Hold] Gerhardt's butt cream, Heparin (Porcine) in NaCl, heparin sodium (porcine), hydrALAZINE, iohexol, labetalol, lidocaine (PF),  methylPREDNISolone sodium succinate, [MAR Hold] ondansetron **OR** [MAR Hold] ondansetron (ZOFRAN) IV, sodium chloride flush, verapamil  Antimicrobials from admission:  Anti-infectives (From admission, onward)    Start     Dose/Rate Route Frequency Ordered Stop   10/03/23 1000  levofloxacin (LEVAQUIN) IVPB 500 mg  Status:  Discontinued        500 mg 100 mL/hr over 60 Minutes Intravenous Every 48 hours 10/01/23 1445 10/01/23 1458   10/03/23 1000  cefTRIAXone (ROCEPHIN) 2 g in sodium chloride 0.9 % 100 mL IVPB  Status:  Discontinued        2 g 200 mL/hr over 30 Minutes Intravenous Every 24 hours 10/01/23 1459 10/02/23 0550   10/02/23 2200  [MAR Hold]  ceFEPIme (MAXIPIME) 1 g in sodium chloride 0.9 % 100 mL IVPB        (MAR Hold since Thu 10/04/2023 at 1326.Hold Reason: Transfer to a Procedural area)  Placed in "Followed by" Linked Group  1 g 200 mL/hr over 30 Minutes Intravenous Every 24 hours 10/02/23 0550     10/02/23 1000  azithromycin (ZITHROMAX) 500 mg in sodium chloride 0.9 % 250 mL IVPB  Status:  Discontinued        500 mg 250 mL/hr over 60 Minutes Intravenous Daily 10/01/23 1459 10/02/23 1553   10/02/23 0645  ceFEPIme (MAXIPIME) 1 g in sodium chloride 0.9 % 100 mL IVPB       Placed in "Followed by" Linked Group   1 g 200 mL/hr over 30 Minutes Intravenous  Once 10/02/23 0550 10/02/23 0717   10/01/23 0915  vancomycin (VANCOCIN) IVPB 1000 mg/200 mL premix        1,000 mg 200 mL/hr over 60 Minutes Intravenous  Once 10/01/23 0913 10/01/23 1058   10/01/23 0915  levofloxacin (LEVAQUIN) IVPB 750 mg        750 mg 100 mL/hr over 90 Minutes Intravenous  Once 10/01/23 0913 10/01/23 1807           Data Reviewed:  I have personally reviewed the following...  CBC: Recent Labs  Lab 10/01/23 0912 10/02/23 0600 10/03/23 0222 10/04/23 0441  WBC 12.3* 8.1 5.2 7.1  NEUTROABS 10.7*  --   --   --   HGB 11.6* 11.2* 10.9* 10.3*  HCT 35.9* 34.2* 32.4* 30.4*  MCV 81.2 79.5* 78.8* 76.4*   PLT 105* 108* 107* 115*   Basic Metabolic Panel: Recent Labs  Lab 10/01/23 0912 10/02/23 0600 10/03/23 0222 10/04/23 0441  NA 138 133* 133* 133*  K 3.0* 3.5 3.3* 3.3*  CL 101 98 97* 98  CO2 23 24 21* 23  GLUCOSE 180* 147* 148* 155*  BUN 31* 23 32* 40*  CREATININE 7.42* 5.13* 6.23* 7.42*  CALCIUM 8.6* 8.6* 8.3* 8.2*  MG  --   --  1.9 2.1  PHOS  --   --  4.0 4.3   GFR: Estimated Creatinine Clearance: 6.8 mL/min (A) (by C-G formula based on SCr of 7.42 mg/dL (H)). Liver Function Tests: Recent Labs  Lab 10/01/23 0912 10/02/23 0600  AST 25 81*  ALT 7 16  ALKPHOS 70 68  BILITOT 0.9 1.0  PROT 6.7 6.3*  ALBUMIN 3.1* 2.9*   No results for input(s): "LIPASE", "AMYLASE" in the last 168 hours. Recent Labs  Lab 10/01/23 2221  AMMONIA 12   Coagulation Profile: Recent Labs  Lab 10/01/23 0912 10/03/23 1018  INR 1.4* 1.2   Cardiac Enzymes: No results for input(s): "CKTOTAL", "CKMB", "CKMBINDEX", "TROPONINI" in the last 168 hours. BNP (last 3 results) No results for input(s): "PROBNP" in the last 8760 hours. HbA1C: Recent Labs    10/01/23 2221  HGBA1C 6.5*   CBG: Recent Labs  Lab 10/03/23 1140 10/03/23 1612 10/03/23 2204 10/04/23 0732 10/04/23 1127  GLUCAP 165* 113* 174* 160* 132*   Lipid Profile: Recent Labs    10/03/23 1303  CHOL 90  HDL 16*  LDLCALC 44  TRIG 161  CHOLHDL 5.6   Thyroid Function Tests: No results for input(s): "TSH", "T4TOTAL", "FREET4", "T3FREE", "THYROIDAB" in the last 72 hours. Anemia Panel: No results for input(s): "VITAMINB12", "FOLATE", "FERRITIN", "TIBC", "IRON", "RETICCTPCT" in the last 72 hours. Most Recent Urinalysis On File:     Component Value Date/Time   COLORURINE Yellow 12/28/2011 1746   APPEARANCEUR Turbid 12/28/2011 1746   LABSPEC 1.010 12/28/2011 1746   PHURINE 8.0 12/28/2011 1746   GLUCOSEU >=500 12/28/2011 1746   HGBUR 2+ 12/28/2011 1746   BILIRUBINUR Negative 12/28/2011 1746  KETONESUR Negative  12/28/2011 1746   PROTEINUR 100 mg/dL 16/03/9603 5409   NITRITE Negative 12/28/2011 1746   LEUKOCYTESUR 3+ 12/28/2011 1746   Sepsis Labs: @LABRCNTIP (procalcitonin:4,lacticidven:4) Microbiology: Recent Results (from the past 240 hours)  Resp panel by RT-PCR (RSV, Flu A&B, Covid) Anterior Nasal Swab     Status: None   Collection Time: 10/01/23  9:12 AM   Specimen: Anterior Nasal Swab  Result Value Ref Range Status   SARS Coronavirus 2 by RT PCR NEGATIVE NEGATIVE Final    Comment: (NOTE) SARS-CoV-2 target nucleic acids are NOT DETECTED.  The SARS-CoV-2 RNA is generally detectable in upper respiratory specimens during the acute phase of infection. The lowest concentration of SARS-CoV-2 viral copies this assay can detect is 138 copies/mL. A negative result does not preclude SARS-Cov-2 infection and should not be used as the sole basis for treatment or other patient management decisions. A negative result may occur with  improper specimen collection/handling, submission of specimen other than nasopharyngeal swab, presence of viral mutation(s) within the areas targeted by this assay, and inadequate number of viral copies(<138 copies/mL). A negative result must be combined with clinical observations, patient history, and epidemiological information. The expected result is Negative.  Fact Sheet for Patients:  BloggerCourse.com  Fact Sheet for Healthcare Providers:  SeriousBroker.it  This test is no t yet approved or cleared by the [place] FDA and  has been authorized for detection and/or diagnosis of SARS-CoV-2 by FDA under an Emergency Use Authorization (EUA). This EUA will remain  in effect (meaning this test can be used) for the duration of the COVID-19 declaration under Section 564(b)(1) of the Act, 21 U.S.C.section 360bbb-3(b)(1), unless the authorization is terminated  or revoked sooner.       Influenza A by PCR  NEGATIVE NEGATIVE Final   Influenza B by PCR NEGATIVE NEGATIVE Final    Comment: (NOTE) The Xpert Xpress SARS-CoV-2/FLU/RSV plus assay is intended as an aid in the diagnosis of influenza from Nasopharyngeal swab specimens and should not be used as a sole basis for treatment. Nasal washings and aspirates are unacceptable for Xpert Xpress SARS-CoV-2/FLU/RSV testing.  Fact Sheet for Patients: BloggerCourse.com  Fact Sheet for Healthcare Providers: SeriousBroker.it  This test is not yet approved or cleared by the [place] FDA and has been authorized for detection and/or diagnosis of SARS-CoV-2 by FDA under an Emergency Use Authorization (EUA). This EUA will remain in effect (meaning this test can be used) for the duration of the COVID-19 declaration under Section 564(b)(1) of the Act, 21 U.S.C. section 360bbb-3(b)(1), unless the authorization is terminated or revoked.     Resp Syncytial Virus by PCR NEGATIVE NEGATIVE Final    Comment: (NOTE) Fact Sheet for Patients: BloggerCourse.com  Fact Sheet for Healthcare Providers: SeriousBroker.it  This test is not yet approved or cleared by the [place] FDA and has been authorized for detection and/or diagnosis of SARS-CoV-2 by FDA under an Emergency Use Authorization (EUA). This EUA will remain in effect (meaning this test can be used) for the duration of the COVID-19 declaration under Section 564(b)(1) of the Act, 21 U.S.C. section 360bbb-3(b)(1), unless the authorization is terminated or revoked.  Performed at [facility], [address] [place], [place] [address]   Blood Culture (routine x 2)     Status: Abnormal   Collection Time: 10/01/23  9:12 AM   Specimen: BLOOD  Result Value Ref Range Status   Specimen Description   Final    BLOOD BLOOD LEFT FOREARM  Performed at [facility], [address] [place], [place] [address]    Special Requests   Final    BOTTLES DRAWN AEROBIC AND ANAEROBIC Blood Culture results may not be optimal due to an inadequate volume of blood received in culture bottles Performed at [facility], [address], [place], [place] [address]    Culture  Setup Time   Final    GRAM NEGATIVE RODS IN BOTH AEROBIC AND ANAEROBIC BOTTLES CRITICAL VALUE NOTED.  VALUE IS CONSISTENT WITH PREVIOUSLY REPORTED AND CALLED VALUE. Performed at [facility], [address] [place], [place] [address]    Culture (A)  Final    SERRATIA MARCESCENS SUSCEPTIBILITIES PERFORMED ON PREVIOUS CULTURE WITHIN THE LAST 5 DAYS. Performed at [facility], [address] [place], [place] [address]    Report Status 10/04/2023 FINAL  Final  Respiratory (~20 pathogens) panel by PCR     Status: None   Collection Time: 10/01/23  9:12 AM   Specimen: Nasopharyngeal Swab; Respiratory  Result Value Ref Range Status   Adenovirus NOT DETECTED NOT DETECTED Final   Coronavirus 229E NOT DETECTED NOT DETECTED Final    Comment: (NOTE) The Coronavirus on the Respiratory Panel, DOES NOT test for the novel  Coronavirus (2019 nCoV)    Coronavirus HKU1 NOT DETECTED NOT DETECTED Final   Coronavirus NL63 NOT DETECTED NOT DETECTED Final   Coronavirus OC43 NOT DETECTED NOT DETECTED Final   Metapneumovirus NOT DETECTED NOT DETECTED Final   Rhinovirus / Enterovirus NOT DETECTED NOT DETECTED Final   Influenza A NOT DETECTED NOT DETECTED Final   Influenza B NOT DETECTED NOT DETECTED Final   Parainfluenza Virus 1 NOT DETECTED NOT DETECTED Final   Parainfluenza Virus 2 NOT DETECTED NOT DETECTED Final   Parainfluenza Virus 3 NOT DETECTED NOT DETECTED Final   Parainfluenza Virus 4 NOT DETECTED NOT DETECTED Final   Respiratory Syncytial Virus NOT DETECTED NOT DETECTED Final   Bordetella pertussis NOT DETECTED NOT DETECTED Final   Bordetella Parapertussis NOT DETECTED NOT DETECTED Final    Chlamydophila pneumoniae NOT DETECTED NOT DETECTED Final   Mycoplasma pneumoniae NOT DETECTED NOT DETECTED Final    Comment: Performed at [facility], [address] [place], [place] [address]  Blood Culture (routine x 2)     Status: Abnormal   Collection Time: 10/01/23  9:33 AM   Specimen: BLOOD  Result Value Ref Range Status   Specimen Description   Final    BLOOD RIGHT WRIST Performed at [facility], [address] [place], [place] [address]    Special Requests   Final    BOTTLES DRAWN AEROBIC AND ANAEROBIC Blood Culture adequate volume Performed at [facility], [address] [place], [place] [address]    Culture  Setup Time   Final    GRAM NEGATIVE RODS IN BOTH AEROBIC AND ANAEROBIC BOTTLES CRITICAL RESULT CALLED TO, READ BACK BY AND VERIFIED WITHDawayne Cirri AT 6160 10/02/23 JG Performed at [facility], [address], [place], [place] [address]    Culture SERRATIA MARCESCENS (A)  Final   Report Status 10/04/2023 FINAL  Final   Organism ID, Bacteria SERRATIA MARCESCENS  Final      Susceptibility   Serratia marcescens - MIC*    CEFEPIME <=0.12 SENSITIVE Sensitive     CEFTAZIDIME <=1 SENSITIVE Sensitive     CEFTRIAXONE <=0.25 SENSITIVE Sensitive     CIPROFLOXACIN <=0.25 SENSITIVE Sensitive  GENTAMICIN <=1 SENSITIVE Sensitive     TRIMETH/SULFA <=20 SENSITIVE Sensitive     * SERRATIA MARCESCENS  Blood Culture ID Panel (Reflexed)     Status: Abnormal   Collection Time: 10/01/23  9:33 AM  Result Value Ref Range Status   Enterococcus faecalis NOT DETECTED NOT DETECTED Final   Enterococcus Faecium NOT DETECTED NOT DETECTED Final   Listeria monocytogenes NOT DETECTED NOT DETECTED Final   Staphylococcus species NOT DETECTED NOT DETECTED Final   Staphylococcus aureus (BCID) NOT DETECTED NOT DETECTED Final   Staphylococcus epidermidis NOT DETECTED NOT DETECTED Final   Staphylococcus lugdunensis NOT DETECTED NOT DETECTED Final    Streptococcus species NOT DETECTED NOT DETECTED Final   Streptococcus agalactiae NOT DETECTED NOT DETECTED Final   Streptococcus pneumoniae NOT DETECTED NOT DETECTED Final   Streptococcus pyogenes NOT DETECTED NOT DETECTED Final   A.calcoaceticus-baumannii NOT DETECTED NOT DETECTED Final   Bacteroides fragilis NOT DETECTED NOT DETECTED Final   Enterobacterales DETECTED (A) NOT DETECTED Final    Comment: Enterobacterales represent a large order of gram negative bacteria, not a single organism. CRITICAL RESULT CALLED TO, READ BACK BY AND VERIFIED WITH:  NATHAN BELUE AT 0457 10/02/23 JG    Enterobacter cloacae complex NOT DETECTED NOT DETECTED Final   Escherichia coli NOT DETECTED NOT DETECTED Final   Klebsiella aerogenes NOT DETECTED NOT DETECTED Final   Klebsiella oxytoca NOT DETECTED NOT DETECTED Final   Klebsiella pneumoniae NOT DETECTED NOT DETECTED Final   Proteus species NOT DETECTED NOT DETECTED Final   Salmonella species NOT DETECTED NOT DETECTED Final   Serratia marcescens DETECTED (A) NOT DETECTED Final    Comment: CRITICAL RESULT CALLED TO, READ BACK BY AND VERIFIED WITH:  NATHAN BELUE AT 0457 10/02/23 JG    Haemophilus influenzae NOT DETECTED NOT DETECTED Final   Neisseria meningitidis NOT DETECTED NOT DETECTED Final   Pseudomonas aeruginosa NOT DETECTED NOT DETECTED Final   Stenotrophomonas maltophilia NOT DETECTED NOT DETECTED Final   Candida albicans NOT DETECTED NOT DETECTED Final   Candida auris NOT DETECTED NOT DETECTED Final   Candida glabrata NOT DETECTED NOT DETECTED Final   Candida krusei NOT DETECTED NOT DETECTED Final   Candida parapsilosis NOT DETECTED NOT DETECTED Final   Candida tropicalis NOT DETECTED NOT DETECTED Final   Cryptococcus neoformans/gattii NOT DETECTED NOT DETECTED Final   CTX-M ESBL NOT DETECTED NOT DETECTED Final   Carbapenem resistance IMP NOT DETECTED NOT DETECTED Final   Carbapenem resistance KPC NOT DETECTED NOT DETECTED Final    Carbapenem resistance NDM NOT DETECTED NOT DETECTED Final   Carbapenem resist OXA 48 LIKE NOT DETECTED NOT DETECTED Final   Carbapenem resistance VIM NOT DETECTED NOT DETECTED Final    Comment: Performed at [facility], [address] [place], [place] [address]  MRSA Next Gen by PCR, Nasal     Status: None   Collection Time: 10/02/23  5:26 PM   Specimen: Nasal Mucosa; Nasal Swab  Result Value Ref Range Status   MRSA by PCR Next Gen NOT DETECTED NOT DETECTED Final    Comment: (NOTE) The GeneXpert MRSA Assay (FDA approved for NASAL specimens only), is one component of a comprehensive MRSA colonization surveillance program. It is not intended to diagnose MRSA infection nor to guide or monitor treatment for MRSA infections. Test performance is not FDA approved in patients less than 90 years old. Performed at [facility], [address] [place], [place] [address]   Cath Tip Culture  Status: Abnormal (Preliminary result)   Collection Time: 10/02/23  9:28 PM   Specimen: Catheter Tip; Other  Result Value Ref Range Status   Specimen Description   Final    CATH TIP Performed at [facility], [address], [place], [place] [address]    Special Requests   Final    NONE Performed at [facility], [address] [place], [place] [address]    Culture (A)  Final    SERRATIA MARCESCENS SUSCEPTIBILITIES TO FOLLOW Performed at [facility], [address], [place], [place] [address]    Report Status PENDING  Incomplete  Culture, blood (Routine X 2) w Reflex to ID Panel     Status: None (Preliminary result)   Collection Time: 10/03/23  1:00 PM   Specimen: BLOOD  Result Value Ref Range Status   Specimen Description BLOOD BLOOD LEFT WRIST  Final   Special Requests   Final    BOTTLES DRAWN AEROBIC ONLY Blood Culture adequate volume   Culture   Final    NO GROWTH < 24 HOURS Performed at [facility], [address]  [place], [place] [address]    Report Status PENDING  Incomplete  Culture, blood (Routine X 2) w Reflex to ID Panel     Status: None (Preliminary result)   Collection Time: 10/03/23  1:04 PM   Specimen: BLOOD  Result Value Ref Range Status   Specimen Description BLOOD BLOOD LEFT FOREARM  Final   Special Requests   Final    BOTTLES DRAWN AEROBIC ONLY Blood Culture adequate volume   Culture   Final    NO GROWTH < 24 HOURS Performed at [facility], [address], [place], [place] [address]    Report Status PENDING  Incomplete      Radiology Studies last 3 days: ECHOCARDIOGRAM COMPLETE Result Date: 10/02/2023    ECHOCARDIOGRAM REPORT   Patient Name:   Amy Wu Date of Exam: 10/02/2023 Medical Rec #:  [id number]     Height:       57.0 in Accession #:    [id number]    Weight:       220.2 lb Date of Birth:  1950-08-13    BSA:          1.874 m Patient Age:    72 years      BP:           154/64 mmHg Patient Gender: F             HR:           89 bpm. Exam Location:  [facility] Procedure: 2D Echo, Cardiac Doppler, Color Doppler and Intracardiac            Opacification Agent (Both Spectral and Color Flow Doppler were            utilized during procedure). Indications:     Cardiomegaly  History:         Patient has no prior history of Echocardiogram examinations.                  Cardiomegaly; Risk Factors:Hypertension, Diabetes and                  Dyslipidemia. ESRD on Dialysis.  Sonographer:     Clarke Crouch Referring Phys:  [phone] Alayne Allis NEWTON Diagnosing Phys: Sammy Crisp MD  Sonographer Comments: Technically difficult study due to poor echo windows, suboptimal apical window and patient is  obese. Image acquisition challenging due to respiratory motion. IMPRESSIONS  1. Left ventricular ejection fraction, by estimation, is >55%. The left ventricle has normal function. Left ventricular endocardial border not optimally defined to evaluate regional wall motion. There is mild left ventricular  hypertrophy. Left ventricular diastolic parameters are indeterminate.  2. Right ventricular systolic function is low normal. The right ventricular size is normal.  3. The mitral valve is normal in structure. No evidence of mitral valve regurgitation. No evidence of mitral stenosis.  4. The aortic valve is tricuspid. There is moderate thickening of the aortic valve. Aortic valve regurgitation is not visualized. Aortic valve sclerosis is present, with no evidence of aortic valve stenosis. FINDINGS  Left Ventricle: Left ventricular ejection fraction, by estimation, is >55%. The left ventricle has normal function. Left ventricular endocardial border not optimally defined to evaluate regional wall motion. Definity contrast agent was given IV to delineate the left ventricular endocardial borders. The left ventricular internal cavity size was normal in size. There is mild left ventricular hypertrophy. Left ventricular diastolic parameters are indeterminate. Right Ventricle: The right ventricular size is normal. Right vetricular wall thickness was not well visualized. Right ventricular systolic function is low normal. Left Atrium: Left atrial size was normal in size. Right Atrium: Right atrial size was not well visualized. Pericardium: The pericardium was not well visualized. Mitral Valve: The mitral valve is normal in structure. Mild mitral annular calcification. No evidence of mitral valve regurgitation. No evidence of mitral valve stenosis. MV peak gradient, 6.8 mmHg. The mean mitral valve gradient is 3.0 mmHg. Tricuspid Valve: The tricuspid valve is normal in structure. Tricuspid valve regurgitation is mild. Aortic Valve: The aortic valve is tricuspid. There is moderate thickening of the aortic valve. Aortic valve regurgitation is not visualized. Aortic valve sclerosis is present, with no evidence of aortic valve stenosis. Aortic valve mean gradient measures  5.0 mmHg. Aortic valve peak gradient measures 11.0 mmHg.  Aortic valve area, by VTI measures 2.65 cm. Pulmonic Valve: The pulmonic valve was grossly normal. Pulmonic valve regurgitation is trivial. No evidence of pulmonic stenosis. Aorta: The aortic root is normal in size and structure. Pulmonary Artery: The pulmonary artery is of normal size. Venous: The inferior vena cava was not well visualized. IAS/Shunts: The interatrial septum was not well visualized.  LEFT VENTRICLE PLAX 2D LVIDd:         3.80 cm   Diastology LVIDs:         2.50 cm   LV e' medial:    8.60 cm/s LV PW:         1.15 cm   LV E/e' medial:  12.3 LV IVS:        1.06 cm   LV e' lateral:   7.22 cm/s LVOT diam:     2.10 cm   LV E/e' lateral: 14.7 LV SV:         79 LV SV Index:   42 LVOT Area:     3.46 cm  LEFT ATRIUM           Index LA diam:      3.60 cm 1.92 cm/m LA Vol (A4C): 60.2 ml 32.13 ml/m  AORTIC VALVE                     PULMONIC VALVE AV Area (Vmax):    2.94 cm      PV Vmax:       0.94 m/s AV Area (Vmean):   2.62 cm  PV Peak grad:  3.5 mmHg AV Area (VTI):     2.65 cm AV Vmax:           166.00 cm/s AV Vmean:          107.000 cm/s AV VTI:            0.296 m AV Peak Grad:      11.0 mmHg AV Mean Grad:      5.0 mmHg LVOT Vmax:         141.00 cm/s LVOT Vmean:        81.050 cm/s LVOT VTI:          0.227 m LVOT/AV VTI ratio: 0.77  AORTA Ao Root diam: 3.30 cm MITRAL VALVE                TRICUSPID VALVE MV Area (PHT): 2.71 cm     TR Peak grad:   34.3 mmHg MV Area VTI:   2.34 cm     TR Vmax:        293.00 cm/s MV Peak grad:  6.8 mmHg MV Mean grad:  3.0 mmHg     SHUNTS MV Vmax:       1.30 m/s     Systemic VTI:  0.23 m MV Vmean:      82.3 cm/s    Systemic Diam: 2.10 cm MV Decel Time: 280 msec MV E velocity: 106.00 cm/s MV A velocity: 114.00 cm/s MV E/A ratio:  0.93 Sammy Crisp MD Electronically signed by Sammy Crisp MD Signature Date/Time: 10/02/2023/1:03:27 PM    Final    DG Chest Port 1 View Result Date: 10/01/2023 CLINICAL DATA:  Questionable sepsis - evaluate for abnormality Altered  mental status.  Fever.  Hypoxemia. EXAM: PORTABLE CHEST 1 VIEW COMPARISON:  Radiographs 05/23/2021 and 07/20/2020. FINDINGS: 0932 hours. Lower lung volumes with probable vascular congestion and mildly increased left basilar atelectasis. No overt pulmonary edema or confluent airspace disease. There may be a small left pleural effusion. The heart size and mediastinal contours appears stable with aortic atherosclerosis. No acute osseous findings are seen. Telemetry leads overlie the chest. IMPRESSION: Lower lung volumes with probable vascular congestion and mildly increased left basilar atelectasis. No definite evidence of pneumonia. Electronically Signed   By: Elmon Hagedorn M.D.   On: 10/01/2023 10:12       Time spent: 35 min     Melodi Sprung, DO [facility] 10/04/2023, 3:34 PM    Dictation software may have been used to generate the above note. Typos may occur and escape review in typed/dictated notes. Please contact Dr Authur Leghorn directly for clarity if needed.  Staff may message me via secure chat in Epic  but this may not receive an immediate response,  please page me for urgent matters!  If 7PM-7AM, please contact night coverage [email or web address]

## 2023-10-04 NOTE — Consult Note (Signed)
 PHARMACY - ANTICOAGULATION CONSULT NOTE  Pharmacy Consult for IV Heparin Indication: chest pain/ACS  Patient Measurements: Weight: 99.9 kg (220 lb 3.8 oz) HDW: 65 kg  Labs: Recent Labs    10/01/23 0912 10/02/23 0600 10/03/23 0222 10/03/23 0421 10/03/23 0935 10/03/23 1018 10/03/23 1853 10/04/23 0441  HGB 11.6* 11.2* 10.9*  --   --   --   --  10.3*  HCT 35.9* 34.2* 32.4*  --   --   --   --  30.4*  PLT 105* 108* 107*  --   --   --   --  115*  APTT  --   --   --   --   --  35  --   --   LABPROT 17.4*  --   --   --   --  15.1  --   --   INR 1.4*  --   --   --   --  1.2  --   --   HEPARINUNFRC  --   --   --   --   --   --  0.10* 0.18*  CREATININE 7.42* 5.13* 6.23*  --   --   --   --  7.42*  TROPONINIHS  --   --  4,332* 9,518* 5,122*  --   --   --     Estimated Creatinine Clearance: 6.8 mL/min (A) (by C-G formula based on SCr of 7.42 mg/dL (H)).   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 Troponin checked 4/16 and profoundly elevated. Concern for ACS, pharmacy consulted to manage heparin infusion.  Baseline aPTT 35s, INR 1.2. CBC notable for stable anemia / mild thrombocytopenia  Goal of Therapy:  Heparin level 0.3-0.7 units/ml Monitor platelets by anticoagulation protocol: Yes  04/16@1853 : HL 0.10, subtherapeutic 04/17 0441 HL 0.18, subtherapeutic   Plan:  --Bolus 2000 units x 1 --Increase heparin infusion rate to 1250 units/hr --Check HL 8 hours after rate change --Daily CBC per protocol while on IV heparin  Coretta Dexter, PharmD, [employer] 10/04/2023 6:40 AM

## 2023-10-05 ENCOUNTER — Encounter: Payer: Self-pay | Admitting: Cardiology

## 2023-10-05 ENCOUNTER — Encounter
Admission: EM | Disposition: A | Discharge status: Home Health | Payer: Self-pay | Source: Home / Self Care | Attending: Osteopathic Medicine

## 2023-10-05 DIAGNOSIS — Z992 Dependence on renal dialysis: Secondary | ICD-10-CM | POA: Diagnosis not present

## 2023-10-05 DIAGNOSIS — N186 End stage renal disease: Secondary | ICD-10-CM | POA: Diagnosis not present

## 2023-10-05 DIAGNOSIS — T827XXA Infection and inflammatory reaction due to other cardiac and vascular devices, implants and grafts, initial encounter: Secondary | ICD-10-CM | POA: Diagnosis not present

## 2023-10-05 DIAGNOSIS — R7881 Bacteremia: Secondary | ICD-10-CM

## 2023-10-05 DIAGNOSIS — R6521 Severe sepsis with septic shock: Secondary | ICD-10-CM

## 2023-10-05 DIAGNOSIS — I214 Non-ST elevation (NSTEMI) myocardial infarction: Secondary | ICD-10-CM | POA: Diagnosis not present

## 2023-10-05 DIAGNOSIS — B9689 Other specified bacterial agents as the cause of diseases classified elsewhere: Secondary | ICD-10-CM | POA: Diagnosis not present

## 2023-10-05 DIAGNOSIS — J9601 Acute respiratory failure with hypoxia: Secondary | ICD-10-CM | POA: Diagnosis not present

## 2023-10-05 DIAGNOSIS — A419 Sepsis, unspecified organism: Secondary | ICD-10-CM | POA: Diagnosis not present

## 2023-10-05 HISTORY — PX: DIALYSIS/PERMA CATHETER INSERTION: CATH118288

## 2023-10-05 LAB — BASIC METABOLIC PANEL WITH GFR
Anion gap: 14 (ref 5–15)
BUN: 52 mg/dL — ABNORMAL HIGH (ref 8–23)
CO2: 21 mmol/L — ABNORMAL LOW (ref 22–32)
Calcium: 8.6 mg/dL — ABNORMAL LOW (ref 8.9–10.3)
Chloride: 98 mmol/L (ref 98–111)
Creatinine, Ser: 8.34 mg/dL — ABNORMAL HIGH (ref 0.44–1.00)
GFR, Estimated: 5 mL/min — ABNORMAL LOW (ref >60)
Glucose, Bld: 318 mg/dL — ABNORMAL HIGH (ref 70–99)
Potassium: 4.4 mmol/L (ref 3.5–5.1)
Sodium: 133 mmol/L — ABNORMAL LOW (ref 135–145)

## 2023-10-05 LAB — CBC
HCT: 33.4 % — ABNORMAL LOW (ref 36.0–46.0)
Hemoglobin: 11.1 g/dL — ABNORMAL LOW (ref 12.0–15.0)
MCH: 25.3 pg — ABNORMAL LOW (ref 26.0–34.0)
MCHC: 33.2 g/dL (ref 30.0–36.0)
MCV: 76.1 fL — ABNORMAL LOW (ref 80.0–100.0)
Platelets: 137 10*3/uL — ABNORMAL LOW (ref 150–400)
RBC: 4.39 MIL/uL (ref 3.87–5.11)
RDW: 17.5 % — ABNORMAL HIGH (ref 11.5–15.5)
WBC: 7.4 10*3/uL (ref 4.0–10.5)
nRBC: 0 % (ref 0.0–0.2)

## 2023-10-05 LAB — GLUCOSE, CAPILLARY
Glucose-Capillary: 292 mg/dL — ABNORMAL HIGH (ref 70–99)
Glucose-Capillary: 316 mg/dL — ABNORMAL HIGH (ref 70–99)
Glucose-Capillary: 252 mg/dL — ABNORMAL HIGH (ref 70–99)
Glucose-Capillary: 343 mg/dL — ABNORMAL HIGH (ref 70–99)
Glucose-Capillary: 372 mg/dL — ABNORMAL HIGH (ref 70–99)
Glucose-Capillary: 360 mg/dL — ABNORMAL HIGH (ref 70–99)

## 2023-10-05 LAB — HEPARIN LEVEL (UNFRACTIONATED): Heparin Unfractionated: 0.43 [IU]/mL (ref 0.30–0.70)

## 2023-10-05 SURGERY — DIALYSIS/PERMA CATHETER INSERTION
Anesthesia: Moderate Sedation

## 2023-10-05 MED ORDER — INSULIN ASPART 100 UNIT/ML IJ SOLN
INTRAMUSCULAR | Status: AC
  Filled 2023-10-05: qty 1

## 2023-10-05 MED ORDER — MIDAZOLAM HCL 2 MG/2ML IJ SOLN
INTRAMUSCULAR | Status: DC | PRN
  Administered 2023-10-05 (×2): 1 mg via INTRAVENOUS

## 2023-10-05 MED ORDER — FENTANYL CITRATE (PF) 100 MCG/2ML IJ SOLN
INTRAMUSCULAR | Status: DC | PRN
  Administered 2023-10-05: 50 ug via INTRAVENOUS

## 2023-10-05 MED ORDER — DIPHENHYDRAMINE HCL 50 MG/ML IJ SOLN: 50.0000 mg | Freq: Once | INTRAMUSCULAR | Status: DC | PRN

## 2023-10-05 MED ORDER — METHYLPREDNISOLONE SODIUM SUCC 125 MG IJ SOLR
125.0000 mg | Freq: Once | INTRAMUSCULAR | Status: DC | PRN
Start: 2023-10-05 — End: 2023-10-05

## 2023-10-05 MED ORDER — ASPIRIN 81 MG PO TBEC
81.0000 mg | DELAYED_RELEASE_TABLET | Freq: Every day | ORAL | Status: DC
  Administered 2023-10-05 – 2023-10-07 (×3): 81 mg via ORAL
  Filled 2023-10-05 (×2): qty 1

## 2023-10-05 MED ORDER — VANCOMYCIN HCL IN DEXTROSE 1-5 GM/200ML-% IV SOLN
INTRAVENOUS | Status: AC
  Filled 2023-10-05: qty 200

## 2023-10-05 MED ORDER — MIDAZOLAM HCL 2 MG/2ML IJ SOLN
INTRAMUSCULAR | Status: AC
  Filled 2023-10-05: qty 2

## 2023-10-05 MED ORDER — HEPARIN SODIUM (PORCINE) 5000 UNIT/ML IJ SOLN: 5000.0000 [IU] | Freq: Three times a day (TID) | INTRAMUSCULAR | Status: DC

## 2023-10-05 MED ORDER — MIDAZOLAM HCL 2 MG/ML PO SYRP: 8.0000 mg | ORAL_SOLUTION | Freq: Once | ORAL | Status: DC | PRN

## 2023-10-05 MED ORDER — VANCOMYCIN HCL IN DEXTROSE 1-5 GM/200ML-% IV SOLN
1000.0000 mg | INTRAVENOUS | Status: AC
  Administered 2023-10-05: 1000 mg via INTRAVENOUS

## 2023-10-05 MED ORDER — SODIUM CHLORIDE 0.9 % IV SOLN: INTRAVENOUS | Status: DC

## 2023-10-05 MED ORDER — INSULIN ASPART 100 UNIT/ML IJ SOLN
5.0000 [IU] | Freq: Three times a day (TID) | INTRAMUSCULAR | Status: DC
  Administered 2023-10-05 – 2023-10-06 (×2): 5 [IU] via SUBCUTANEOUS
  Filled 2023-10-05: qty 1

## 2023-10-05 MED ORDER — ATORVASTATIN CALCIUM 20 MG PO TABS
40.0000 mg | ORAL_TABLET | Freq: Every day | ORAL | Status: DC
  Administered 2023-10-05: 40 mg via ORAL
  Filled 2023-10-05: qty 2

## 2023-10-05 MED ORDER — FENTANYL CITRATE PF 50 MCG/ML IJ SOSY
PREFILLED_SYRINGE | INTRAMUSCULAR | Status: AC
Start: 2023-10-05 — End: ?
  Filled 2023-10-05: qty 1

## 2023-10-05 MED ORDER — FAMOTIDINE 20 MG PO TABS: 40.0000 mg | ORAL_TABLET | Freq: Once | ORAL | Status: DC | PRN

## 2023-10-05 MED ORDER — DIPHENHYDRAMINE HCL 50 MG/ML IJ SOLN
12.5000 mg | Freq: Four times a day (QID) | INTRAMUSCULAR | Status: AC | PRN
  Administered 2023-10-05 – 2023-10-06 (×2): 12.5 mg via INTRAVENOUS
  Filled 2023-10-05 (×2): qty 1

## 2023-10-05 MED ORDER — HEPARIN SODIUM (PORCINE) 10000 UNIT/ML IJ SOLN
INTRAMUSCULAR | Status: AC
  Filled 2023-10-05: qty 1

## 2023-10-05 MED ORDER — LIDOCAINE-EPINEPHRINE (PF) 1 %-1:200000 IJ SOLN
INTRAMUSCULAR | Status: DC | PRN
  Administered 2023-10-05: 30 mL

## 2023-10-05 MED ORDER — HEPARIN SODIUM (PORCINE) 10000 UNIT/ML IJ SOLN
INTRAMUSCULAR | Status: DC | PRN
  Administered 2023-10-05: 10000 [IU]

## 2023-10-05 MED ORDER — HEPARIN (PORCINE) IN NACL 1000-0.9 UT/500ML-% IV SOLN
INTRAVENOUS | Status: DC | PRN
  Administered 2023-10-05: 500 mL

## 2023-10-05 MED ORDER — FENTANYL CITRATE PF 50 MCG/ML IJ SOSY: 12.5000 ug | PREFILLED_SYRINGE | Freq: Once | INTRAMUSCULAR | Status: DC | PRN

## 2023-10-05 SURGICAL SUPPLY — 4 items
KIT CATH CHRNC PALINDROME PRCS (CATHETERS) IMPLANT
PACK ANGIOGRAPHY (CUSTOM PROCEDURE TRAY) IMPLANT
SUT MNCRL AB 4-0 PS2 18 (SUTURE) IMPLANT
SUT PROLENE 0 CT 1 30 (SUTURE) IMPLANT

## 2023-10-05 NOTE — Progress Notes (Signed)
 Date of Admission:  10/01/2023      ID: Amy Wu is a 73 y.o. female Principal Problem:   Septic shock (HCC) Active Problems:   Type 2 diabetes mellitus (HCC)   Hyperlipidemia, unspecified   ESRD (end stage renal disease) (HCC)   Non-ST elevation (NSTEMI) myocardial infarction (HCC)   ESRD on dialysis (HCC)   Acute respiratory failure with hypoxia (HCC)   Encephalopathy   Elevated troponin I level   Bacteremia    Subjective: Doing well Has a new left femoral HD cath  Medications:   [MAR Hold] aspirin  EC  81 mg Oral Daily   [MAR Hold] atorvastatin   40 mg Oral Daily   [MAR Hold] Chlorhexidine  Gluconate Cloth  6 each Topical Q0600   [MAR Hold] insulin  aspart  0-9 Units Subcutaneous TID WC   [MAR Hold] sodium chloride  flush  3 mL Intravenous Q12H    Objective: Vital signs in last 24 hours: Patient Vitals for the past 24 hrs:  BP Temp Temp src Pulse Resp SpO2 Weight  10/05/23 1214 (!) 149/77 -- -- 70 (!) 25 97 % --  10/05/23 1212 (!) 155/65 -- -- 70 19 98 % --  10/05/23 1210 (!) 154/64 -- -- 69 16 92 % --  10/05/23 1208 (!) 160/67 -- -- 69 (!) 25 96 % --  10/05/23 1206 (!) 158/69 -- -- 69 18 95 % --  10/05/23 1204 (!) 175/68 -- -- 70 (!) 22 95 % --  10/05/23 1202 (!) 179/72 -- -- 71 19 96 % --  10/05/23 1200 (!) 166/72 -- -- 69 12 98 % --  10/05/23 1158 (!) 177/67 -- -- 69 18 98 % --  10/05/23 1156 (!) 170/73 -- -- 68 (!) 22 96 % --  10/05/23 1154 (!) 180/76 -- -- 70 20 98 % --  10/05/23 1152 (!) 179/75 -- -- 70 14 97 % --  10/05/23 1150 (!) 175/69 -- -- 70 (!) 24 98 % --  10/05/23 1148 (!) 177/71 -- -- 71 (!) 27 97 % --  10/05/23 1146 (!) 188/72 -- -- 70 20 97 % --  10/05/23 1144 (!) 190/76 -- -- 70 14 97 % --  10/05/23 1142 (!) 189/75 -- -- 70 (!) 23 99 % --  10/05/23 1140 (!) 192/79 -- -- 71 20 99 % --  10/05/23 1138 (!) 189/80 -- -- 71 15 100 % --  10/05/23 1138 (!) 189/80 -- -- 72 17 100 % --  10/05/23 1100 (!) 163/74 -- -- 62 -- 98 % --  10/05/23 1052  (!) 155/72 97.6 F (36.4 C) Oral 63 16 96 % --  10/05/23 1000 (!) 156/72 -- -- 65 15 100 % --  10/05/23 0900 (!) 164/80 -- -- 65 18 100 % --  10/05/23 0800 (!) 158/110 97.9 F (36.6 C) Axillary -- 17 100 % --  10/05/23 0716 -- -- -- -- 12 -- --  10/05/23 0700 (!) 169/67 -- -- 63 12 100 % --  10/05/23 0600 (!) 174/69 -- -- -- -- -- --  10/05/23 0500 -- -- -- -- -- -- 96.4 kg  10/05/23 0400 -- 97.8 F (36.6 C) Axillary 66 18 100 % --  10/05/23 0300 (!) 160/72 -- -- 64 19 100 % --  10/05/23 0200 (!) 178/68 -- -- -- -- -- --  10/05/23 0100 (!) 180/67 -- -- (!) 58 (!) 22 100 % --  10/05/23 0000 (!) 182/64 -- -- 64 (!) 23 100 % --  10/04/23 2300 (!) 160/58 -- -- 66 15 100 % --  10/04/23 2115 (!) 142/62 -- -- -- -- -- --  10/04/23 2000 (!) 152/59 98.4 F (36.9 C) Oral 66 19 100 % --  10/04/23 1830 (!) 166/65 -- -- 71 (!) 21 100 % --  10/04/23 1815 (!) 159/98 -- -- 71 (!) 22 98 % --  10/04/23 1730 (!) 148/65 -- -- 63 19 100 % --  10/04/23 1715 (!) 156/64 -- -- 65 14 100 % --  10/04/23 1700 (!) 160/59 -- -- 65 20 100 % --  10/04/23 1645 (!) 152/59 -- -- 63 15 100 % --  10/04/23 1630 (!) 155/60 -- -- 61 15 100 % --  10/04/23 1600 (!) 131/46 98.3 F (36.8 C) Oral 67 18 100 % --  10/04/23 1530 130/66 -- -- 89 (!) 24 96 % --  10/04/23 1515 94/66 -- -- 95 (!) 23 98 % --  10/04/23 1500 (!) 164/55 -- -- 75 19 98 % --  10/04/23 1445 (!) 164/66 -- -- 66 12 99 % --  10/04/23 1431 130/66 98.4 F (36.9 C) Oral 70 15 96 % --  10/04/23 1418 (!) 145/131 -- -- 69 15 100 % --  10/04/23 1408 (!) 150/62 -- -- 72 15 98 % --  10/04/23 1403 132/62 -- -- 73 20 96 % --  10/04/23 1358 (!) 152/54 -- -- 72 18 99 % --  10/04/23 1353 (!) 145/69 -- -- 71 20 99 % --  10/04/23 1348 (!) 145/70 -- -- 71 17 98 % --  10/04/23 1343 (!) 151/57 -- -- 71 20 98 % --  10/04/23 1338 (!) 151/58 -- -- 70 20 99 % --  10/04/23 1333 136/65 -- -- 70 16 98 % --  10/04/23 1328 (!) 157/58 -- -- 69 19 98 % --  10/04/23 1323 (!)  162/52 -- -- 72 16 98 % --  10/04/23 1318 (!) 165/53 -- -- 69 17 99 % --  10/04/23 1316 (!) 156/79 -- -- 72 19 99 % --      PHYSICAL EXAM:  General: Alert, oriented in pace, person, time- well oriented today.  Lungs: b/l air entry Heart: bradycardia. Abdomen: Soft, non-tender,not distended. Bowel sounds normal. No masses Extremities: rt AKA, left BKA Left groin cath Skin: No rashes or lesions. Or bruising Neurologic: Grossly non-focal  Lab Results    Latest Ref Rng & Units 10/05/2023    5:56 AM 10/04/2023    4:41 AM 10/03/2023    2:22 AM  CBC  WBC 4.0 - 10.5 K/uL 7.4  7.1  5.2   Hemoglobin 12.0 - 15.0 g/dL 44.0  10.2  72.5   Hematocrit 36.0 - 46.0 % 33.4  30.4  32.4   Platelets 150 - 400 K/uL 137  115  107        Latest Ref Rng & Units 10/05/2023    5:56 AM 10/04/2023    4:41 AM 10/03/2023    2:22 AM  CMP  Glucose 70 - 99 mg/dL 366  440  347   BUN 8 - 23 mg/dL 52  40  32   Creatinine 0.44 - 1.00 mg/dL 4.25  9.56  3.87   Sodium 135 - 145 mmol/L 133  133  133   Potassium 3.5 - 5.1 mmol/L 4.4  3.3  3.3   Chloride 98 - 111 mmol/L 98  98  97   CO2 22 - 32 mmol/L 21  23  21  Calcium  8.9 - 10.3 mg/dL 8.6  8.2  8.3       Microbiology: 10/01/23 BC Serratia bacteremia 4/15 cath tip serratia 4/16 BC- NG  Studies/Results: PERIPHERAL VASCULAR CATHETERIZATION Result Date: 10/05/2023 See surgical note for result.  CARDIAC CATHETERIZATION Result Date: 10/04/2023 Images from the original result were not included.   Prox RCA to Mid RCA lesion is 90% stenosed.  Mid RCA lesion is 80% stenosed.Mid RCA lesion is 80% stenosed.   Non-stenotic Ost LM to Dist LM lesion.   Heavily calcified left coronary artery system was not able to be engaged due to inability to advance catheter to the left coronary cusp.   Not able to cross the aortic valve for measurement of the hemodynamics. Dominance: Right Heavily calcified somewhat tortuous RCA was difficult to engage: Prox RCA to Mid RCA lesion is 90%  stenosed. Mid RCA lesion is 80% stenosed.Mid RCA lesion is 80% stenosed. Heavily calcified left coronary artery system was not able to be engaged due to inability to advance catheter to the left coronary cusp. Unable to engage the left coronary artery as the catheters were not advancing beyond the innominate artery and aortic knob. There is extreme tortuosity of the innominate artery and heavy calcifications not allowing the catheter be pushed freely through the artery and into the forearm or brachial aspect of the radial artery and worse around the tortuous innominate. RECOMMENDATIONS   Anticipated discharge date to be determined.   Recommend Aspirin  81mg  daily for moderate CAD.   Based on the presence of severe coronary disease at least in the one-vessel seen and heavy calcification in the left coronary system would strongly consider antiplatelet therapy with at a minimum aspirin  but even potentially Plavix based on elevated troponin levels.  No obvious invasive options for CAD management given lack of viable access. Randene Bustard, MD     Assessment/Plan: Encephalopathy- l resolved   Acute hypoxic resp failure -resolved   Serratia bacteremia  related to HD catheter -catheter was removed and tip is serratia 2 d echo valves look okay Repeat blood culture neg so far On cefepime  -  Will treat for 2 weeks from the neg blood culture 10/17/23 Cefepime  can be given during dialysis- 2grams   ESRD on HD PAD DM Rt AKA and Left BKA   Anemia Thrombocytopenia improved   Discussed the management with the patient   ID will not see her this weekend  [facility] orders  Diagnosis: Serratia bacteremia due to cath infection Baseline Creatinine ESRD    Allergies  Allergen Reactions   Ancef  [Cefazolin ] Palpitations   Contrast Media [Iodinated Contrast Media] Palpitations    [facility] Orders Cefepime  2 grams on the day of dialysis M-W-F Duration: 2 weeks End Date: 10/17/23    Labs weekly while on  IV antibiotics: _X_ CBC with differential  _X_ CMP   _ Fax weekly lab results  promptly to [phone]  Clinic Follow Up Appt: 10/16/23 10.30 am   Call [phone] with any questions

## 2023-10-05 NOTE — Treatment Plan (Signed)
  [facility] orders  Diagnosis: Serratia bacteremia due to cath infection Baseline Creatinine ESRD    Allergies  Allergen Reactions   Ancef  [Cefazolin ] Palpitations   Contrast Media [Iodinated Contrast Media] Palpitations    [facility] Orders Cefepime  2 grams on the day of dialysis M-W-F Duration: 2 weeks End Date: 10/17/23    Labs weekly while on IV antibiotics: _X_ CBC with differential  _X_ CMP   _ Fax weekly lab results  promptly to [phone]  Clinic Follow Up Appt: 10/16/23 10.30 am   Call [phone] with any questions

## 2023-10-05 NOTE — Progress Notes (Signed)
 PROGRESS NOTE    [name]   ZOX:[id number] DOB: June 10, 1951  DOA: 10/01/2023 Date of Service: 10/05/23 which is hospital day 3  PCP: Izella Marshal, Jill E, [facility] course / significant events:   HPI:  Amy Wu is a 73 y.o. female with medical history significant of end-stage renal disease on hemodialysis MWF, type 2 diabetes, hyperlipidemia, peripheral vascular disease presenting to ED 10/01/23 from home with generalized malaise and fevers, confusion per family, mild cough/SOB. No abdominal pain but some diarrhea. Missed hemodialysis 04/13 secondary to symptoms.   04/14: admitted to hospitalist service for sepsis as well as acute resp fail w/ hypoxia and concern for volume overload. Requiring BiPAP  04/15: (+)bacteremia w/ serratia, abx to cefepime . Pt transferred to the stepdown unit for pressors. ID consult, HD catheter removed due to likely source of sepsis. Elevated troponins, started heparin   04/16: Off levo later today and onto scheduled midodrine . Echo ordered. Cardiology consulted, continue heparin  infusion, not on beta-blocker secondary to bradycardia, scheduled for cardiac cath on 04/17. Transthoracic echo ok. Repeating BCx today. Plan reinsertion HD cath 04/18.  04/17: LHC today. Significant CAD, no PCI 04/18: L femoral dialysis cath placed, anticipate dialysis    Consultants:  Infectious disease Vascular surgery  [facility] Cardiology  Procedures/Surgeries: 10/04/23 left heart cath   10/05/23 placement tunneled hemodialysis catheter via the left femoral vein       ASSESSMENT & PLAN:   Severe Sepsis w/ Serratia bacteremia likely source HD catheter which has been removed Complicated by septic shock, see below  Repeat BCx no growth 2 days ID following, appreciate recs re: will pt need IV abx outpatient / will pt need TEE? Cefepime   Septic shock requiring vasopressor support, shock resolved and now off IV pressors Treat underlying cause(s) as  noted midodrine   Elevated troponin secondary NSTEMI vs. demand ischemia  Coronary Artery Disease  Hx: HTN, HLD and PVD Echo 10/02/23: EF >55%; mild tricuspid valve regurgitation; trivial pulmonic valve regurgitation  Cardiac catheterization was attempted 10/04/23 but was very challenging due to difficult vascular anatomy. Only the right coronary artery could be engaged and was found to have sequential 80 to 90% lesions.  telemetry Cardiology following heparin  gtt can D/C (>48h) aspirin  81 mg daily  and consider adding clopidogrel if hemoglobin remains stable  atorvastatin   Consider addition of amlodipine for antianginal therapy and blood pressure control if blood pressure remains elevated.    Acute hypoxic respiratory failure  Likely d/t pulmonary vascular congestion d/t missed dialysis Supplemental O2 for dyspnea and/or hypoxia  Maintain O2 sats 92% or higher  Fluid removal with HD   Acute metabolic encephalopathy d/t severe sepsis - resolved  Treat underlying cause(s) as noted Correct metabolic derangements  Avoid sedating medications as able  Maintain sleep/wake cycle     ESRD on HD (M-W-F) Hyponatremia Trend BMP  Replace electrolytes as indicated  Nephrology consulted appreciate input Vascular surgery consulted appreciate input: line holiday now s/p HD catheter placement today 10/05/23   Elevated AST  Trend hepatic function panel  Avoid hepatotoxic agents as able    Type II diabetes mellitus  CBG's ac/hs SSI  Target CBG's 140 to 180  Follow hypo/hyperglycemic protocol     Class 3 obesity based on BMI: Body mass index is 47.66 kg/m.  Underweight - under 18  overweight - 25 to 29 obese - 30 or more Class 1 obesity: BMI of 30.0 to 34 Class 2 obesity: BMI of 35.0 to 39 Class  3 obesity: BMI of 40.0 to 49 Super Morbid Obesity: BMI 50-59 Super-super Morbid Obesity: BMI 60+ Significantly low or high BMI is associated with higher medical risk.  Weight management  advised as adjunct to other disease management and risk reduction treatments    DVT prophylaxis: on heparin  gtt  IV fluids: no continuous IV fluids  Nutrition: NPO pending cardiac cath Central lines / other devices: none  Code Status: DNR ACP documentation reviewed:  none on file in [facility]  TOC needs: TBD Medical barriers to dispo: bacteremia, dialysis, needs new HD cath placed. Expected medical readiness for discharge no sooner than 04/19 - need ID input on abx/TEE, need HD via new access cath .              Subjective / Brief ROS:  Patient reports feeling good today, examined in specials recovery Denies CP/SOB.  Pain controlled.  Denies new weakness.  Reports no concerns w/ urination/defecation.   Family Communication: family at bedside on rounds    Objective Findings:  Vitals:   10/05/23 1214 10/05/23 1230 10/05/23 1239 10/05/23 1245  BP: (!) 149/77 95/77 (!) 134/55 119/82  Pulse: 70 71 68 68  Resp: (!) 25 18 11 17   Temp:      TempSrc:      SpO2: 97%  97% 97%  Weight:        Intake/Output Summary (Last 24 hours) at 10/05/2023 1418 Last data filed at 10/05/2023 1100 Gross per 24 hour  Intake 525.36 ml  Output --  Net 525.36 ml   Filed Weights   10/02/23 1755 10/04/23 0500 10/05/23 0500  Weight: 99.9 kg 99.9 kg 96.4 kg    Examination:  Physical Exam Constitutional:      General: She is not in acute distress. Cardiovascular:     Rate and Rhythm: Normal rate and regular rhythm.  Pulmonary:     Effort: Pulmonary effort is normal.     Breath sounds: Normal breath sounds.  Abdominal:     Palpations: Abdomen is soft.  Skin:    General: Skin is warm and dry.  Neurological:     General: No focal deficit present.     Mental Status: She is alert and oriented to person, place, and time. Mental status is at baseline.  Psychiatric:        Mood and Affect: Mood normal.        Behavior: Behavior normal.          Scheduled Medications:   aspirin   EC  81 mg Oral Daily   atorvastatin   40 mg Oral Daily   Chlorhexidine  Gluconate Cloth  6 each Topical Q0600   [START ON 10/06/2023] heparin  injection (subcutaneous)  5,000 Units Subcutaneous Q8H   insulin  aspart  0-9 Units Subcutaneous TID WC   insulin  aspart  5 Units Subcutaneous TID WC   sodium chloride  flush  3 mL Intravenous Q12H    Continuous Infusions:  sodium chloride      ceFEPime  (MAXIPIME ) IV Stopped (10/04/23 2206)    PRN Medications:  sodium chloride , acetaminophen , gabapentin , Gerhardt's butt cream, ondansetron  **OR** ondansetron  (ZOFRAN ) IV, sodium chloride  flush  Antimicrobials from admission:  Anti-infectives (From admission, onward)    Start     Dose/Rate Route Frequency Ordered Stop   10/05/23 1021  vancomycin  (VANCOCIN ) IVPB 1000 mg/200 mL premix        1,000 mg 200 mL/hr over 60 Minutes Intravenous 60 min pre-op 10/05/23 1021 10/05/23 1214   10/03/23 1000  levofloxacin  (LEVAQUIN ) IVPB  500 mg  Status:  Discontinued        500 mg 100 mL/hr over 60 Minutes Intravenous Every 48 hours 10/01/23 1445 10/01/23 1458   10/03/23 1000  cefTRIAXone  (ROCEPHIN ) 2 g in sodium chloride  0.9 % 100 mL IVPB  Status:  Discontinued        2 g 200 mL/hr over 30 Minutes Intravenous Every 24 hours 10/01/23 1459 10/02/23 0550   10/02/23 2200  ceFEPIme  (MAXIPIME ) 1 g in sodium chloride  0.9 % 100 mL IVPB       Placed in "Followed by" Linked Group   1 g 200 mL/hr over 30 Minutes Intravenous Every 24 hours 10/02/23 0550     10/02/23 1000  azithromycin  (ZITHROMAX ) 500 mg in sodium chloride  0.9 % 250 mL IVPB  Status:  Discontinued        500 mg 250 mL/hr over 60 Minutes Intravenous Daily 10/01/23 1459 10/02/23 1553   10/02/23 0645  ceFEPIme  (MAXIPIME ) 1 g in sodium chloride  0.9 % 100 mL IVPB       Placed in "Followed by" Linked Group   1 g 200 mL/hr over 30 Minutes Intravenous  Once 10/02/23 0550 10/02/23 0717   10/01/23 0915  vancomycin  (VANCOCIN ) IVPB 1000 mg/200 mL premix        1,000  mg 200 mL/hr over 60 Minutes Intravenous  Once 10/01/23 0913 10/01/23 1058   10/01/23 0915  levofloxacin  (LEVAQUIN ) IVPB 750 mg        750 mg 100 mL/hr over 90 Minutes Intravenous  Once 10/01/23 0913 10/01/23 1807           Data Reviewed:  I have personally reviewed the following...  CBC: Recent Labs  Lab 10/01/23 0912 10/02/23 0600 10/03/23 0222 10/04/23 0441 10/05/23 0556  WBC 12.3* 8.1 5.2 7.1 7.4  NEUTROABS 10.7*  --   --   --   --   HGB 11.6* 11.2* 10.9* 10.3* 11.1*  HCT 35.9* 34.2* 32.4* 30.4* 33.4*  MCV 81.2 79.5* 78.8* 76.4* 76.1*  PLT 105* 108* 107* 115* 137*   Basic Metabolic Panel: Recent Labs  Lab 10/01/23 0912 10/02/23 0600 10/03/23 0222 10/04/23 0441 10/05/23 0556  NA 138 133* 133* 133* 133*  K 3.0* 3.5 3.3* 3.3* 4.4  CL 101 98 97* 98 98  CO2 23 24 21* 23 21*  GLUCOSE 180* 147* 148* 155* 318*  BUN 31* 23 32* 40* 52*  CREATININE 7.42* 5.13* 6.23* 7.42* 8.34*  CALCIUM  8.6* 8.6* 8.3* 8.2* 8.6*  MG  --   --  1.9 2.1  --   PHOS  --   --  4.0 4.3  --    GFR: Estimated Creatinine Clearance: 5.9 mL/min (A) (by C-G formula based on SCr of 8.34 mg/dL (H)). Liver Function Tests: Recent Labs  Lab 10/01/23 0912 10/02/23 0600  AST 25 81*  ALT 7 16  ALKPHOS 70 68  BILITOT 0.9 1.0  PROT 6.7 6.3*  ALBUMIN 3.1* 2.9*   No results for input(s): "LIPASE", "AMYLASE" in the last 168 hours. Recent Labs  Lab 10/01/23 2221  AMMONIA 12   Coagulation Profile: Recent Labs  Lab 10/01/23 0912 10/03/23 1018  INR 1.4* 1.2   Cardiac Enzymes: No results for input(s): "CKTOTAL", "CKMB", "CKMBINDEX", "TROPONINI" in the last 168 hours. BNP (last 3 results) No results for input(s): "PROBNP" in the last 8760 hours. HbA1C: No results for input(s): "HGBA1C" in the last 72 hours.  CBG: Recent Labs  Lab 10/04/23 1558 10/04/23 2122 10/05/23 1610  10/05/23 1050 10/05/23 1241  GLUCAP 154* 301* 292* 316* 252*   Lipid Profile: Recent Labs    10/03/23 1303   CHOL 90  HDL 16*  LDLCALC 44  TRIG 433  CHOLHDL 5.6   Thyroid Function Tests: No results for input(s): "TSH", "T4TOTAL", "FREET4", "T3FREE", "THYROIDAB" in the last 72 hours. Anemia Panel: No results for input(s): "VITAMINB12", "FOLATE", "FERRITIN", "TIBC", "IRON", "RETICCTPCT" in the last 72 hours. Most Recent Urinalysis On File:     Component Value Date/Time   COLORURINE Yellow 12/28/2011 1746   APPEARANCEUR Turbid 12/28/2011 1746   LABSPEC 1.010 12/28/2011 1746   PHURINE 8.0 12/28/2011 1746   GLUCOSEU >=500 12/28/2011 1746   HGBUR 2+ 12/28/2011 1746   BILIRUBINUR Negative 12/28/2011 1746   KETONESUR Negative 12/28/2011 1746   PROTEINUR 100 mg/dL 29/51/8841 6606   NITRITE Negative 12/28/2011 1746   LEUKOCYTESUR 3+ 12/28/2011 1746   Sepsis Labs: @LABRCNTIP (procalcitonin:4,lacticidven:4) Microbiology: Recent Results (from the past 240 hours)  Resp panel by RT-PCR (RSV, Flu A&B, Covid) Anterior Nasal Swab     Status: None   Collection Time: 10/01/23  9:12 AM   Specimen: Anterior Nasal Swab  Result Value Ref Range Status   SARS Coronavirus 2 by RT PCR NEGATIVE NEGATIVE Final    Comment: (NOTE) SARS-CoV-2 target nucleic acids are NOT DETECTED.  The SARS-CoV-2 RNA is generally detectable in upper respiratory specimens during the acute phase of infection. The lowest concentration of SARS-CoV-2 viral copies this assay can detect is 138 copies/mL. A negative result does not preclude SARS-Cov-2 infection and should not be used as the sole basis for treatment or other patient management decisions. A negative result may occur with  improper specimen collection/handling, submission of specimen other than nasopharyngeal swab, presence of viral mutation(s) within the areas targeted by this assay, and inadequate number of viral copies(<138 copies/mL). A negative result must be combined with clinical observations, patient history, and epidemiological information. The expected  result is Negative.  Fact Sheet for Patients:  BloggerCourse.com  Fact Sheet for Healthcare Providers:  SeriousBroker.it  This test is no t yet approved or cleared by the [place]  FDA and  has been authorized for detection and/or diagnosis of SARS-CoV-2 by FDA under an Emergency Use Authorization (EUA). This EUA will remain  in effect (meaning this test can be used) for the duration of the COVID-19 declaration under Section 564(b)(1) of the Act, 21 U.S.C.section 360bbb-3(b)(1), unless the authorization is terminated  or revoked sooner.       Influenza A by PCR NEGATIVE NEGATIVE Final   Influenza B by PCR NEGATIVE NEGATIVE Final    Comment: (NOTE) The Xpert Xpress SARS-CoV-2/FLU/RSV plus assay is intended as an aid in the diagnosis of influenza from Nasopharyngeal swab specimens and should not be used as a sole basis for treatment. Nasal washings and aspirates are unacceptable for Xpert Xpress SARS-CoV-2/FLU/RSV testing.  Fact Sheet for Patients: BloggerCourse.com  Fact Sheet for Healthcare Providers: SeriousBroker.it  This test is not yet approved or cleared by the [place]  FDA and has been authorized for detection and/or diagnosis of SARS-CoV-2 by FDA under an Emergency Use Authorization (EUA). This EUA will remain in effect (meaning this test can be used) for the duration of the COVID-19 declaration under Section 564(b)(1) of the Act, 21 U.S.C. section 360bbb-3(b)(1), unless the authorization is terminated or revoked.     Resp Syncytial Virus by PCR NEGATIVE NEGATIVE Final    Comment: (NOTE) Fact Sheet for Patients: BloggerCourse.com  Fact Sheet for  Healthcare Providers: SeriousBroker.it  This test is not yet approved or cleared by the [place]  FDA and has been authorized for detection and/or diagnosis of  SARS-CoV-2 by FDA under an Emergency Use Authorization (EUA). This EUA will remain in effect (meaning this test can be used) for the duration of the COVID-19 declaration under Section 564(b)(1) of the Act, 21 U.S.C. section 360bbb-3(b)(1), unless the authorization is terminated or revoked.  Performed at [facility], [address], [place], [place] [address]   Blood Culture (routine x 2)     Status: Abnormal   Collection Time: 10/01/23  9:12 AM   Specimen: BLOOD  Result Value Ref Range Status   Specimen Description   Final    BLOOD BLOOD LEFT FOREARM Performed at [facility], [address], [place], [place] [address]    Special Requests   Final    BOTTLES DRAWN AEROBIC AND ANAEROBIC Blood Culture results may not be optimal due to an inadequate volume of blood received in culture bottles Performed at [facility], [address], [place], [place] [address]    Culture  Setup Time   Final    GRAM NEGATIVE RODS IN BOTH AEROBIC AND ANAEROBIC BOTTLES CRITICAL VALUE NOTED.  VALUE IS CONSISTENT WITH PREVIOUSLY REPORTED AND CALLED VALUE. Performed at [facility], [address] [place], [place] [address]    Culture (A)  Final    SERRATIA MARCESCENS SUSCEPTIBILITIES PERFORMED ON PREVIOUS CULTURE WITHIN THE LAST 5 DAYS. Performed at [facility], [address], [place], [place] [address]    Report Status 10/04/2023 FINAL  Final  Respiratory (~20 pathogens) panel by PCR     Status: None   Collection Time: 10/01/23  9:12 AM   Specimen: Nasopharyngeal Swab; Respiratory  Result Value Ref Range Status   Adenovirus NOT DETECTED NOT DETECTED Final   Coronavirus 229E NOT DETECTED NOT DETECTED Final    Comment: (NOTE) The Coronavirus on the Respiratory Panel, DOES NOT test for the novel  Coronavirus (2019 nCoV)    Coronavirus HKU1 NOT DETECTED NOT DETECTED Final   Coronavirus NL63 NOT DETECTED NOT DETECTED Final   Coronavirus OC43 NOT  DETECTED NOT DETECTED Final   Metapneumovirus NOT DETECTED NOT DETECTED Final   Rhinovirus / Enterovirus NOT DETECTED NOT DETECTED Final   Influenza A NOT DETECTED NOT DETECTED Final   Influenza B NOT DETECTED NOT DETECTED Final   Parainfluenza Virus 1 NOT DETECTED NOT DETECTED Final   Parainfluenza Virus 2 NOT DETECTED NOT DETECTED Final   Parainfluenza Virus 3 NOT DETECTED NOT DETECTED Final   Parainfluenza Virus 4 NOT DETECTED NOT DETECTED Final   Respiratory Syncytial Virus NOT DETECTED NOT DETECTED Final   Bordetella pertussis NOT DETECTED NOT DETECTED Final   Bordetella Parapertussis NOT DETECTED NOT DETECTED Final   Chlamydophila pneumoniae NOT DETECTED NOT DETECTED Final   Mycoplasma pneumoniae NOT DETECTED NOT DETECTED Final    Comment: Performed at [facility], [address], [place], [place] [address]  Blood Culture (routine x 2)     Status: Abnormal   Collection Time: 10/01/23  9:33 AM   Specimen: BLOOD  Result Value Ref Range Status   Specimen Description   Final    BLOOD RIGHT WRIST Performed at [facility], [address] [place], [place] [address]    Special Requests   Final    BOTTLES DRAWN AEROBIC AND ANAEROBIC Blood Culture adequate volume Performed at [facility], [address] [place], [place] [address]    Culture  Setup Time   Final    GRAM NEGATIVE RODS IN BOTH AEROBIC AND ANAEROBIC BOTTLES CRITICAL RESULT CALLED TO, READ BACK BY AND VERIFIED WITH:  NATHAN BELUE AT 0457 10/02/23 JG Performed at [facility], [address] [place], [place] [address]    Culture SERRATIA MARCESCENS (A)  Final   Report Status 10/04/2023 FINAL  Final   Organism ID, Bacteria SERRATIA MARCESCENS  Final      Susceptibility   Serratia marcescens - MIC*    CEFEPIME  <=0.12 SENSITIVE Sensitive     CEFTAZIDIME <=1 SENSITIVE Sensitive     CEFTRIAXONE  <=0.25 SENSITIVE Sensitive     CIPROFLOXACIN  <=0.25 SENSITIVE Sensitive     GENTAMICIN <=1  SENSITIVE Sensitive     TRIMETH /SULFA  <=20 SENSITIVE Sensitive     * SERRATIA MARCESCENS  Blood Culture ID Panel (Reflexed)     Status: Abnormal   Collection Time: 10/01/23  9:33 AM  Result Value Ref Range Status   Enterococcus faecalis NOT DETECTED NOT DETECTED Final   Enterococcus Faecium NOT DETECTED NOT DETECTED Final   Listeria monocytogenes NOT DETECTED NOT DETECTED Final   Staphylococcus species NOT DETECTED NOT DETECTED Final   Staphylococcus aureus (BCID) NOT DETECTED NOT DETECTED Final   Staphylococcus epidermidis NOT DETECTED NOT DETECTED Final   Staphylococcus lugdunensis NOT DETECTED NOT DETECTED Final   Streptococcus species NOT DETECTED NOT DETECTED Final   Streptococcus agalactiae NOT DETECTED NOT DETECTED Final   Streptococcus pneumoniae NOT DETECTED NOT DETECTED Final   Streptococcus pyogenes NOT DETECTED NOT DETECTED Final   A.calcoaceticus-baumannii NOT DETECTED NOT DETECTED Final   Bacteroides fragilis NOT DETECTED NOT DETECTED Final   Enterobacterales DETECTED (A) NOT DETECTED Final    Comment: Enterobacterales represent a large order of gram negative bacteria, not a single organism. CRITICAL RESULT CALLED TO, READ BACK BY AND VERIFIED WITH:  NATHAN BELUE AT 0457 10/02/23 JG    Enterobacter cloacae complex NOT DETECTED NOT DETECTED Final   Escherichia coli NOT DETECTED NOT DETECTED Final   Klebsiella aerogenes NOT DETECTED NOT DETECTED Final   Klebsiella oxytoca NOT DETECTED NOT DETECTED Final   Klebsiella pneumoniae NOT DETECTED NOT DETECTED Final   Proteus species NOT DETECTED NOT DETECTED Final   Salmonella species NOT DETECTED NOT DETECTED Final   Serratia marcescens DETECTED (A) NOT DETECTED Final    Comment: CRITICAL RESULT CALLED TO, READ BACK BY AND VERIFIED WITH:  NATHAN BELUE AT 0457 10/02/23 JG    Haemophilus influenzae NOT DETECTED NOT DETECTED Final   Neisseria meningitidis NOT DETECTED NOT DETECTED Final   Pseudomonas aeruginosa NOT DETECTED NOT  DETECTED Final   Stenotrophomonas maltophilia NOT DETECTED NOT DETECTED Final   Candida albicans NOT DETECTED NOT DETECTED Final   Candida auris NOT DETECTED NOT DETECTED Final   Candida glabrata NOT DETECTED NOT DETECTED Final   Candida krusei NOT DETECTED NOT DETECTED Final   Candida parapsilosis NOT DETECTED NOT DETECTED Final   Candida tropicalis NOT DETECTED NOT DETECTED Final   Cryptococcus neoformans/gattii NOT DETECTED NOT DETECTED Final   CTX-M ESBL NOT DETECTED NOT DETECTED Final   Carbapenem resistance IMP NOT DETECTED NOT DETECTED Final   Carbapenem resistance KPC NOT DETECTED NOT DETECTED Final   Carbapenem resistance NDM NOT DETECTED NOT DETECTED Final   Carbapenem resist OXA 48 LIKE NOT DETECTED NOT DETECTED Final   Carbapenem resistance VIM NOT DETECTED NOT DETECTED Final    Comment: Performed at [facility], [address] [place], [place] [address]  MRSA Next Gen by PCR, Nasal     Status: None   Collection Time: 10/02/23  5:26 PM   Specimen: Nasal Mucosa; Nasal Swab  Result Value Ref Range Status   MRSA by PCR Next Gen NOT DETECTED NOT DETECTED Final    Comment: (NOTE) The GeneXpert MRSA Assay (FDA approved for NASAL specimens only), is one component of a comprehensive MRSA colonization surveillance program. It is not intended to diagnose MRSA infection nor to guide or monitor treatment for MRSA infections. Test performance is not FDA approved in patients less than 3 years old. Performed at [facility], [address], [place], [place] [address]   Cath Tip Culture     Status: Abnormal (Preliminary result)   Collection Time: 10/02/23  9:28 PM   Specimen: Catheter Tip; Other  Result Value Ref Range Status   Specimen Description   Final    CATH TIP Performed at [facility], [address], [place], [place] [address]    Special Requests   Final    NONE Performed at [facility], [address], [place], [place]  [address]    Culture (A)  Final    SERRATIA MARCESCENS SUSCEPTIBILITIES TO FOLLOW Performed at [facility], [address], [place], [place] [address]    Report Status PENDING  Incomplete  Culture, blood (Routine X 2) w Reflex to ID Panel     Status: None (Preliminary result)   Collection Time: 10/03/23  1:00 PM   Specimen: BLOOD  Result Value Ref Range Status   Specimen Description BLOOD BLOOD LEFT WRIST  Final   Special Requests   Final    BOTTLES DRAWN AEROBIC ONLY Blood Culture adequate volume   Culture   Final    NO GROWTH 2 DAYS Performed at [facility], [address] [place], [place] [address]    Report Status PENDING  Incomplete  Culture, blood (Routine X 2) w Reflex to ID Panel     Status: None (Preliminary result)   Collection Time: 10/03/23  1:04 PM   Specimen: BLOOD  Result Value Ref Range Status   Specimen Description BLOOD BLOOD LEFT FOREARM  Final   Special Requests   Final    BOTTLES DRAWN AEROBIC ONLY Blood Culture adequate volume   Culture   Final    NO GROWTH 2 DAYS Performed at [facility], [address], [place], [place] [address]    Report Status PENDING  Incomplete      Radiology Studies last 3 days: PERIPHERAL VASCULAR CATHETERIZATION Result Date: 10/05/2023 See surgical note for result.  CARDIAC CATHETERIZATION Result Date: 10/04/2023 Images from the original result were not included.   Prox RCA to Mid RCA lesion is 90% stenosed.  Mid RCA lesion is 80% stenosed.Mid RCA lesion is 80% stenosed.   Non-stenotic Ost LM to Dist LM lesion.   Heavily calcified left coronary artery system was not able to be engaged due to inability to advance catheter to the left coronary cusp.   Not able to cross the aortic valve for measurement of the hemodynamics. Dominance: Right Heavily calcified somewhat tortuous RCA was difficult to engage: Prox RCA to Mid RCA lesion is 90% stenosed. Mid RCA lesion is 80% stenosed.Mid RCA lesion is 80%  stenosed. Heavily calcified left coronary artery system was not able to be engaged due to inability to advance catheter to the left coronary cusp. Unable to engage the  left coronary artery as the catheters were not advancing beyond the innominate artery and aortic knob. There is extreme tortuosity of the innominate artery and heavy calcifications not allowing the catheter be pushed freely through the artery and into the forearm or brachial aspect of the radial artery and worse around the tortuous innominate. RECOMMENDATIONS   Anticipated discharge date to be determined.   Recommend Aspirin  81mg  daily for moderate CAD.   Based on the presence of severe coronary disease at least in the one-vessel seen and heavy calcification in the left coronary system would strongly consider antiplatelet therapy with at a minimum aspirin  but even potentially Plavix based on elevated troponin levels.  No obvious invasive options for CAD management given lack of viable access. Randene Bustard, MD   ECHOCARDIOGRAM COMPLETE Result Date: 10/02/2023    ECHOCARDIOGRAM REPORT   Patient Name:   Amy Wu Date of Exam: 10/02/2023 Medical Rec #:  [id number]     Height:       57.0 in Accession #:    [id number]    Weight:       220.2 lb Date of Birth:  1950/09/14    BSA:          1.874 m Patient Age:    72 years      BP:           154/64 mmHg Patient Gender: F             HR:           89 bpm. Exam Location:  [facility] Procedure: 2D Echo, Cardiac Doppler, Color Doppler and Intracardiac            Opacification Agent (Both Spectral and Color Flow Doppler were            utilized during procedure). Indications:     Cardiomegaly  History:         Patient has no prior history of Echocardiogram examinations.                  Cardiomegaly; Risk Factors:Hypertension, Diabetes and                  Dyslipidemia. ESRD on Dialysis.  Sonographer:     Clarke Crouch Referring Phys:  [phone] Alayne Allis NEWTON Diagnosing Phys: Sammy Crisp MD  Sonographer  Comments: Technically difficult study due to poor echo windows, suboptimal apical window and patient is obese. Image acquisition challenging due to respiratory motion. IMPRESSIONS  1. Left ventricular ejection fraction, by estimation, is >55%. The left ventricle has normal function. Left ventricular endocardial border not optimally defined to evaluate regional wall motion. There is mild left ventricular hypertrophy. Left ventricular diastolic parameters are indeterminate.  2. Right ventricular systolic function is low normal. The right ventricular size is normal.  3. The mitral valve is normal in structure. No evidence of mitral valve regurgitation. No evidence of mitral stenosis.  4. The aortic valve is tricuspid. There is moderate thickening of the aortic valve. Aortic valve regurgitation is not visualized. Aortic valve sclerosis is present, with no evidence of aortic valve stenosis. FINDINGS  Left Ventricle: Left ventricular ejection fraction, by estimation, is >55%. The left ventricle has normal function. Left ventricular endocardial border not optimally defined to evaluate regional wall motion. Definity  contrast agent was given IV to delineate the left ventricular endocardial borders. The left ventricular internal cavity size was normal in size. There is mild left ventricular hypertrophy. Left ventricular diastolic parameters are indeterminate. Right  Ventricle: The right ventricular size is normal. Right vetricular wall thickness was not well visualized. Right ventricular systolic function is low normal. Left Atrium: Left atrial size was normal in size. Right Atrium: Right atrial size was not well visualized. Pericardium: The pericardium was not well visualized. Mitral Valve: The mitral valve is normal in structure. Mild mitral annular calcification. No evidence of mitral valve regurgitation. No evidence of mitral valve stenosis. MV peak gradient, 6.8 mmHg. The mean mitral valve gradient is 3.0 mmHg. Tricuspid  Valve: The tricuspid valve is normal in structure. Tricuspid valve regurgitation is mild. Aortic Valve: The aortic valve is tricuspid. There is moderate thickening of the aortic valve. Aortic valve regurgitation is not visualized. Aortic valve sclerosis is present, with no evidence of aortic valve stenosis. Aortic valve mean gradient measures  5.0 mmHg. Aortic valve peak gradient measures 11.0 mmHg. Aortic valve area, by VTI measures 2.65 cm. Pulmonic Valve: The pulmonic valve was grossly normal. Pulmonic valve regurgitation is trivial. No evidence of pulmonic stenosis. Aorta: The aortic root is normal in size and structure. Pulmonary Artery: The pulmonary artery is of normal size. Venous: The inferior vena cava was not well visualized. IAS/Shunts: The interatrial septum was not well visualized.  LEFT VENTRICLE PLAX 2D LVIDd:         3.80 cm   Diastology LVIDs:         2.50 cm   LV e' medial:    8.60 cm/s LV PW:         1.15 cm   LV E/e' medial:  12.3 LV IVS:        1.06 cm   LV e' lateral:   7.22 cm/s LVOT diam:     2.10 cm   LV E/e' lateral: 14.7 LV SV:         79 LV SV Index:   42 LVOT Area:     3.46 cm  LEFT ATRIUM           Index LA diam:      3.60 cm 1.92 cm/m LA Vol (A4C): 60.2 ml 32.13 ml/m  AORTIC VALVE                     PULMONIC VALVE AV Area (Vmax):    2.94 cm      PV Vmax:       0.94 m/s AV Area (Vmean):   2.62 cm      PV Peak grad:  3.5 mmHg AV Area (VTI):     2.65 cm AV Vmax:           166.00 cm/s AV Vmean:          107.000 cm/s AV VTI:            0.296 m AV Peak Grad:      11.0 mmHg AV Mean Grad:      5.0 mmHg LVOT Vmax:         141.00 cm/s LVOT Vmean:        81.050 cm/s LVOT VTI:          0.227 m LVOT/AV VTI ratio: 0.77  AORTA Ao Root diam: 3.30 cm MITRAL VALVE                TRICUSPID VALVE MV Area (PHT): 2.71 cm     TR Peak grad:   34.3 mmHg MV Area VTI:   2.34 cm     TR Vmax:        293.00  cm/s MV Peak grad:  6.8 mmHg MV Mean grad:  3.0 mmHg     SHUNTS MV Vmax:       1.30 m/s      Systemic VTI:  0.23 m MV Vmean:      82.3 cm/s    Systemic Diam: 2.10 cm MV Decel Time: 280 msec MV E velocity: 106.00 cm/s MV A velocity: 114.00 cm/s MV E/A ratio:  0.93 Sammy Crisp MD Electronically signed by Sammy Crisp MD Signature Date/Time: 10/02/2023/1:03:27 PM    Final        Time spent: 35 min     Melodi Sprung, DO [facility] 10/05/2023, 2:18 PM    Dictation software may have been used to generate the above note. Typos may occur and escape review in typed/dictated notes. Please contact Dr Authur Leghorn directly for clarity if needed.  Staff may message me via secure chat in Epic  but this may not receive an immediate response,  please page me for urgent matters!  If 7PM-7AM, please contact night coverage [email or web address]

## 2023-10-05 NOTE — Interval H&P Note (Signed)
 History and Physical Interval Note:  10/05/2023 10:58 AM  Amy Wu  has presented today for surgery, with the diagnosis of ESRD.  The various methods of treatment have been discussed with the patient and family. After consideration of risks, benefits and other options for treatment, the patient has consented to  Procedure(s): DIALYSIS/PERMA CATHETER INSERTION (N/A) as a surgical intervention.  The patient's history has been reviewed, patient examined, no change in status, stable for surgery.  I have reviewed the patient's chart and labs.  Questions were answered to the patient's satisfaction.     [name]

## 2023-10-05 NOTE — Plan of Care (Signed)
 Continuing with plan of care.

## 2023-10-05 NOTE — Progress Notes (Signed)
 Report given to receiving nurse Alease Hunter, RN

## 2023-10-05 NOTE — Progress Notes (Signed)
 Patient transferred to room 223 in hospital bed, with all belongings, on cardiac monitoring along with 2L of oxygen.

## 2023-10-05 NOTE — Consult Note (Signed)
 PHARMACY - ANTICOAGULATION CONSULT NOTE  Pharmacy Consult for IV Heparin  Indication: chest pain/ACS  Patient Measurements: Weight: 99.9 kg (220 lb 3.8 oz) HDW: 65 kg  Labs: Recent Labs    10/03/23 0222 10/03/23 0421 10/03/23 0935 10/03/23 1018 10/03/23 1853 10/04/23 0441 10/05/23 0556  HGB 10.9*  --   --   --   --  10.3* 11.1*  HCT 32.4*  --   --   --   --  30.4* 33.4*  PLT 107*  --   --   --   --  115* 137*  APTT  --   --   --  35  --   --   --   LABPROT  --   --   --  15.1  --   --   --   INR  --   --   --  1.2  --   --   --   HEPARINUNFRC  --   --   --   --  0.10* 0.18* 0.43  CREATININE 6.23*  --   --   --   --  7.42* 8.34*  TROPONINIHS 6,721* 1,191* 5,122*  --   --   --   --     Estimated Creatinine Clearance: 6.1 mL/min (A) (by C-G formula based on SCr of 8.34 mg/dL (H)).   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 Troponin checked 4/16 and profoundly elevated. Concern for ACS, pharmacy consulted to manage heparin  infusion.  Baseline aPTT 35s, INR 1.2. CBC notable for stable anemia / mild thrombocytopenia  Goal of Therapy:  Heparin  level 0.3-0.7 units/ml Monitor platelets by anticoagulation protocol: Yes  04/16@1853 : HL 0.10, subtherapeutic 04/17 0441 HL 0.18, subtherapeutic 0417 1200 heparin  stopped for procedure 0417 1945 resume heparin  drip 2 hrs post ban removal (removed@1745 )      @1250  units/hr 04/18 0556 HL 0.43, therapeutic x 1   Plan:  --Continue heparin  infusion at 1250 units/hr, until stop 4/18 @ 1100 --Pharmacy to F/U anticoag plan following stop of infusion if  appropriate  Coretta Dexter, PharmD, [facility] 10/05/2023 7:06 AM

## 2023-10-05 NOTE — Progress Notes (Signed)
 [facility]  ROUNDING NOTE   Subjective:   Amy Wu  is a 73 y.o.  female with past medical history of hypertension, diabetes, PVD, dyslipidemia, and end-stage renal disease on dialysis. She presents to the ED via EMS for evaluation of altered mental status and fever and has been admitted under observation for Acute respiratory failure with hypoxia (HCC) [J96.01] Sepsis (HCC) [A41.9] Sepsis, due to unspecified organism, unspecified whether acute organ dysfunction present [facility]) [A41.9]  Patient is known to our practice and receives outpatient dialysis treatments at [facility] on a MWF schedule, supervised by [facility] physicians.   Patient seen sitting up in bed Appears in good spirits Currently on 6 L nasal cannula appears to have required BiPAP early this morning   Objective:  Vital signs in last 24 hours:  Temp:  [97.8 F (36.6 C)-98.4 F (36.9 C)] 97.9 F (36.6 C) (04/18 0800) Pulse Rate:  [58-95] 65 (04/18 0900) Resp:  [12-24] 18 (04/18 0900) BP: (94-182)/(46-131) 164/80 (04/18 0900) SpO2:  [96 %-100 %] 100 % (04/18 0900) FiO2 (%):  [30 %] 30 % (04/18 0716) Weight:  [96.4 kg] 96.4 kg (04/18 0500)  Weight change: -3.5 kg Filed Weights   10/02/23 1755 10/04/23 0500 10/05/23 0500  Weight: 99.9 kg 99.9 kg 96.4 kg    Intake/Output: I/O last 3 completed shifts: In: 708.2 [P.O.:240; I.V.:268.2; IV Piggyback:200] Out: 0    Intake/Output this shift:  Total I/O In: 12.4 [I.V.:12.4] Out: -   Physical Exam: General: NAD  Head: Normocephalic, atraumatic.   Eyes: Anicteric  Lungs:  Diminished, [place] O2  Heart: Regular rate and rhythm  Abdomen:  Soft, nontender  Extremities:  Trace Rt stump peripheral edema. Bilat AKA  Neurologic: Alert, moving all four extremities  Skin: No lesions  Access: Rt thigh permcath (removed on 4/15)    Basic Metabolic Panel: Recent Labs  Lab 10/01/23 0912 10/02/23 0600 10/03/23 0222 10/04/23 0441 10/05/23 0556  NA  138 133* 133* 133* 133*  K 3.0* 3.5 3.3* 3.3* 4.4  CL 101 98 97* 98 98  CO2 23 24 21* 23 21*  GLUCOSE 180* 147* 148* 155* 318*  BUN 31* 23 32* 40* 52*  CREATININE 7.42* 5.13* 6.23* 7.42* 8.34*  CALCIUM  8.6* 8.6* 8.3* 8.2* 8.6*  MG  --   --  1.9 2.1  --   PHOS  --   --  4.0 4.3  --     Liver Function Tests: Recent Labs  Lab 10/01/23 0912 10/02/23 0600  AST 25 81*  ALT 7 16  ALKPHOS 70 68  BILITOT 0.9 1.0  PROT 6.7 6.3*  ALBUMIN 3.1* 2.9*   No results for input(s): "LIPASE", "AMYLASE" in the last 168 hours. Recent Labs  Lab 10/01/23 2221  AMMONIA 12    CBC: Recent Labs  Lab 10/01/23 0912 10/02/23 0600 10/03/23 0222 10/04/23 0441 10/05/23 0556  WBC 12.3* 8.1 5.2 7.1 7.4  NEUTROABS 10.7*  --   --   --   --   HGB 11.6* 11.2* 10.9* 10.3* 11.1*  HCT 35.9* 34.2* 32.4* 30.4* 33.4*  MCV 81.2 79.5* 78.8* 76.4* 76.1*  PLT 105* 108* 107* 115* 137*    Cardiac Enzymes: No results for input(s): "CKTOTAL", "CKMB", "CKMBINDEX", "TROPONINI" in the last 168 hours.  BNP: Invalid input(s): "POCBNP"  CBG: Recent Labs  Lab 10/04/23 0732 10/04/23 1127 10/04/23 1558 10/04/23 2122 10/05/23 0729  GLUCAP 160* 132* 154* 301* 292*    Microbiology: Results for orders placed  or performed during the hospital encounter of 10/01/23  Resp panel by RT-PCR (RSV, Flu A&B, Covid) Anterior Nasal Swab     Status: None   Collection Time: 10/01/23  9:12 AM   Specimen: Anterior Nasal Swab  Result Value Ref Range Status   SARS Coronavirus 2 by RT PCR NEGATIVE NEGATIVE Final    Comment: (NOTE) SARS-CoV-2 target nucleic acids are NOT DETECTED.  The SARS-CoV-2 RNA is generally detectable in upper respiratory specimens during the acute phase of infection. The lowest concentration of SARS-CoV-2 viral copies this assay can detect is 138 copies/mL. A negative result does not preclude SARS-Cov-2 infection and should not be used as the sole basis for treatment or other patient management  decisions. A negative result may occur with  improper specimen collection/handling, submission of specimen other than nasopharyngeal swab, presence of viral mutation(s) within the areas targeted by this assay, and inadequate number of viral copies(<138 copies/mL). A negative result must be combined with clinical observations, patient history, and epidemiological information. The expected result is Negative.  Fact Sheet for Patients:  BloggerCourse.com  Fact Sheet for Healthcare Providers:  SeriousBroker.it  This test is no t yet approved or cleared by the [place]  FDA and  has been authorized for detection and/or diagnosis of SARS-CoV-2 by FDA under an Emergency Use Authorization (EUA). This EUA will remain  in effect (meaning this test can be used) for the duration of the COVID-19 declaration under Section 564(b)(1) of the Act, 21 U.S.C.section 360bbb-3(b)(1), unless the authorization is terminated  or revoked sooner.       Influenza A by PCR NEGATIVE NEGATIVE Final   Influenza B by PCR NEGATIVE NEGATIVE Final    Comment: (NOTE) The Xpert Xpress SARS-CoV-2/FLU/RSV plus assay is intended as an aid in the diagnosis of influenza from Nasopharyngeal swab specimens and should not be used as a sole basis for treatment. Nasal washings and aspirates are unacceptable for Xpert Xpress SARS-CoV-2/FLU/RSV testing.  Fact Sheet for Patients: BloggerCourse.com  Fact Sheet for Healthcare Providers: SeriousBroker.it  This test is not yet approved or cleared by the [place]  FDA and has been authorized for detection and/or diagnosis of SARS-CoV-2 by FDA under an Emergency Use Authorization (EUA). This EUA will remain in effect (meaning this test can be used) for the duration of the COVID-19 declaration under Section 564(b)(1) of the Act, 21 U.S.C. section 360bbb-3(b)(1), unless the  authorization is terminated or revoked.     Resp Syncytial Virus by PCR NEGATIVE NEGATIVE Final    Comment: (NOTE) Fact Sheet for Patients: BloggerCourse.com  Fact Sheet for Healthcare Providers: SeriousBroker.it  This test is not yet approved or cleared by the [place]  FDA and has been authorized for detection and/or diagnosis of SARS-CoV-2 by FDA under an Emergency Use Authorization (EUA). This EUA will remain in effect (meaning this test can be used) for the duration of the COVID-19 declaration under Section 564(b)(1) of the Act, 21 U.S.C. section 360bbb-3(b)(1), unless the authorization is terminated or revoked.  Performed at [facility], [address] [place], [place] [address]   Blood Culture (routine x 2)     Status: Abnormal   Collection Time: 10/01/23  9:12 AM   Specimen: BLOOD  Result Value Ref Range Status   Specimen Description   Final    BLOOD BLOOD LEFT FOREARM Performed at [facility], [address] [place], [place] [address]    Special Requests   Final    BOTTLES DRAWN AEROBIC AND ANAEROBIC  Blood Culture results may not be optimal due to an inadequate volume of blood received in culture bottles Performed at [facility], [address] [place], [place] [address]    Culture  Setup Time   Final    GRAM NEGATIVE RODS IN BOTH AEROBIC AND ANAEROBIC BOTTLES CRITICAL VALUE NOTED.  VALUE IS CONSISTENT WITH PREVIOUSLY REPORTED AND CALLED VALUE. Performed at [facility], [address] [place], [place] [address]    Culture (A)  Final    SERRATIA MARCESCENS SUSCEPTIBILITIES PERFORMED ON PREVIOUS CULTURE WITHIN THE LAST 5 DAYS. Performed at [facility], [address] [place], [place] [address]    Report Status 10/04/2023 FINAL  Final  Respiratory (~20 pathogens) panel by PCR     Status: None   Collection Time: 10/01/23  9:12 AM   Specimen: Nasopharyngeal  Swab; Respiratory  Result Value Ref Range Status   Adenovirus NOT DETECTED NOT DETECTED Final   Coronavirus 229E NOT DETECTED NOT DETECTED Final    Comment: (NOTE) The Coronavirus on the Respiratory Panel, DOES NOT test for the novel  Coronavirus (2019 nCoV)    Coronavirus HKU1 NOT DETECTED NOT DETECTED Final   Coronavirus NL63 NOT DETECTED NOT DETECTED Final   Coronavirus OC43 NOT DETECTED NOT DETECTED Final   Metapneumovirus NOT DETECTED NOT DETECTED Final   Rhinovirus / Enterovirus NOT DETECTED NOT DETECTED Final   Influenza A NOT DETECTED NOT DETECTED Final   Influenza B NOT DETECTED NOT DETECTED Final   Parainfluenza Virus 1 NOT DETECTED NOT DETECTED Final   Parainfluenza Virus 2 NOT DETECTED NOT DETECTED Final   Parainfluenza Virus 3 NOT DETECTED NOT DETECTED Final   Parainfluenza Virus 4 NOT DETECTED NOT DETECTED Final   Respiratory Syncytial Virus NOT DETECTED NOT DETECTED Final   Bordetella pertussis NOT DETECTED NOT DETECTED Final   Bordetella Parapertussis NOT DETECTED NOT DETECTED Final   Chlamydophila pneumoniae NOT DETECTED NOT DETECTED Final   Mycoplasma pneumoniae NOT DETECTED NOT DETECTED Final    Comment: Performed at [facility], [address] [place], [place] [address]  Blood Culture (routine x 2)     Status: Abnormal   Collection Time: 10/01/23  9:33 AM   Specimen: BLOOD  Result Value Ref Range Status   Specimen Description   Final    BLOOD RIGHT WRIST Performed at [facility], [address], [place], [place] [address]    Special Requests   Final    BOTTLES DRAWN AEROBIC AND ANAEROBIC Blood Culture adequate volume Performed at [facility], [address] [place], [place] [address]    Culture  Setup Time   Final    GRAM NEGATIVE RODS IN BOTH AEROBIC AND ANAEROBIC BOTTLES CRITICAL RESULT CALLED TO, READ BACK BY AND VERIFIED WITH:  NATHAN BELUE AT 0457 10/02/23 JG Performed at [facility], [address]  [place], [place] [address]    Culture SERRATIA MARCESCENS (A)  Final   Report Status 10/04/2023 FINAL  Final   Organism ID, Bacteria SERRATIA MARCESCENS  Final      Susceptibility   Serratia marcescens - MIC*    CEFEPIME  <=0.12 SENSITIVE Sensitive     CEFTAZIDIME <=1 SENSITIVE Sensitive     CEFTRIAXONE  <=0.25 SENSITIVE Sensitive     CIPROFLOXACIN  <=0.25 SENSITIVE Sensitive     GENTAMICIN <=1 SENSITIVE Sensitive     TRIMETH /SULFA  <=20 SENSITIVE Sensitive     * SERRATIA MARCESCENS  Blood Culture ID Panel (Reflexed)  Status: Abnormal   Collection Time: 10/01/23  9:33 AM  Result Value Ref Range Status   Enterococcus faecalis NOT DETECTED NOT DETECTED Final   Enterococcus Faecium NOT DETECTED NOT DETECTED Final   Listeria monocytogenes NOT DETECTED NOT DETECTED Final   Staphylococcus species NOT DETECTED NOT DETECTED Final   Staphylococcus aureus (BCID) NOT DETECTED NOT DETECTED Final   Staphylococcus epidermidis NOT DETECTED NOT DETECTED Final   Staphylococcus lugdunensis NOT DETECTED NOT DETECTED Final   Streptococcus species NOT DETECTED NOT DETECTED Final   Streptococcus agalactiae NOT DETECTED NOT DETECTED Final   Streptococcus pneumoniae NOT DETECTED NOT DETECTED Final   Streptococcus pyogenes NOT DETECTED NOT DETECTED Final   A.calcoaceticus-baumannii NOT DETECTED NOT DETECTED Final   Bacteroides fragilis NOT DETECTED NOT DETECTED Final   Enterobacterales DETECTED (A) NOT DETECTED Final    Comment: Enterobacterales represent a large order of gram negative bacteria, not a single organism. CRITICAL RESULT CALLED TO, READ BACK BY AND VERIFIED WITH:  NATHAN BELUE AT 0457 10/02/23 JG    Enterobacter cloacae complex NOT DETECTED NOT DETECTED Final   Escherichia coli NOT DETECTED NOT DETECTED Final   Klebsiella aerogenes NOT DETECTED NOT DETECTED Final   Klebsiella oxytoca NOT DETECTED NOT DETECTED Final   Klebsiella pneumoniae NOT DETECTED NOT DETECTED Final   Proteus species NOT  DETECTED NOT DETECTED Final   Salmonella species NOT DETECTED NOT DETECTED Final   Serratia marcescens DETECTED (A) NOT DETECTED Final    Comment: CRITICAL RESULT CALLED TO, READ BACK BY AND VERIFIED WITH:  NATHAN BELUE AT 0457 10/02/23 JG    Haemophilus influenzae NOT DETECTED NOT DETECTED Final   Neisseria meningitidis NOT DETECTED NOT DETECTED Final   Pseudomonas aeruginosa NOT DETECTED NOT DETECTED Final   Stenotrophomonas maltophilia NOT DETECTED NOT DETECTED Final   Candida albicans NOT DETECTED NOT DETECTED Final   Candida auris NOT DETECTED NOT DETECTED Final   Candida glabrata NOT DETECTED NOT DETECTED Final   Candida krusei NOT DETECTED NOT DETECTED Final   Candida parapsilosis NOT DETECTED NOT DETECTED Final   Candida tropicalis NOT DETECTED NOT DETECTED Final   Cryptococcus neoformans/gattii NOT DETECTED NOT DETECTED Final   CTX-M ESBL NOT DETECTED NOT DETECTED Final   Carbapenem resistance IMP NOT DETECTED NOT DETECTED Final   Carbapenem resistance KPC NOT DETECTED NOT DETECTED Final   Carbapenem resistance NDM NOT DETECTED NOT DETECTED Final   Carbapenem resist OXA 48 LIKE NOT DETECTED NOT DETECTED Final   Carbapenem resistance VIM NOT DETECTED NOT DETECTED Final    Comment: Performed at [facility], [address] [place], [place] [address]  MRSA Next Gen by PCR, Nasal     Status: None   Collection Time: 10/02/23  5:26 PM   Specimen: Nasal Mucosa; Nasal Swab  Result Value Ref Range Status   MRSA by PCR Next Gen NOT DETECTED NOT DETECTED Final    Comment: (NOTE) The GeneXpert MRSA Assay (FDA approved for NASAL specimens only), is one component of a comprehensive MRSA colonization surveillance program. It is not intended to diagnose MRSA infection nor to guide or monitor treatment for MRSA infections. Test performance is not FDA approved in patients less than 2 years old. Performed at [facility], [address] [place], [place] [address]    Cath Tip Culture     Status: Abnormal (Preliminary result)   Collection Time: 10/02/23  9:28 PM   Specimen: Catheter Tip; Other  Result Value Ref Range Status   Specimen Description  Final    CATH TIP Performed at [facility], [address] [place], [place] [address]    Special Requests   Final    NONE Performed at [facility], [address], [place], [place] [address]    Culture (A)  Final    SERRATIA MARCESCENS SUSCEPTIBILITIES TO FOLLOW Performed at [facility], [address], [place], [place] [address]    Report Status PENDING  Incomplete  Culture, blood (Routine X 2) w Reflex to ID Panel     Status: None (Preliminary result)   Collection Time: 10/03/23  1:00 PM   Specimen: BLOOD  Result Value Ref Range Status   Specimen Description BLOOD BLOOD LEFT WRIST  Final   Special Requests   Final    BOTTLES DRAWN AEROBIC ONLY Blood Culture adequate volume   Culture   Final    NO GROWTH 2 DAYS Performed at [facility], [address] [place], [place] [address]    Report Status PENDING  Incomplete  Culture, blood (Routine X 2) w Reflex to ID Panel     Status: None (Preliminary result)   Collection Time: 10/03/23  1:04 PM   Specimen: BLOOD  Result Value Ref Range Status   Specimen Description BLOOD BLOOD LEFT FOREARM  Final   Special Requests   Final    BOTTLES DRAWN AEROBIC ONLY Blood Culture adequate volume   Culture   Final    NO GROWTH 2 DAYS Performed at [facility], [address] [place], [place] [address]    Report Status PENDING  Incomplete    Coagulation Studies: Recent Labs    10/03/23 1018  LABPROT 15.1  INR 1.2    Urinalysis: No results for input(s): "COLORURINE", "LABSPEC", "PHURINE", "GLUCOSEU", "HGBUR", "BILIRUBINUR", "KETONESUR", "PROTEINUR", "UROBILINOGEN", "NITRITE", "LEUKOCYTESUR" in the last 72 hours.  Invalid input(s): "APPERANCEUR"    Imaging: CARDIAC CATHETERIZATION Result Date:  10/04/2023 Images from the original result were not included.   Prox RCA to Mid RCA lesion is 90% stenosed.  Mid RCA lesion is 80% stenosed.Mid RCA lesion is 80% stenosed.   Non-stenotic Ost LM to Dist LM lesion.   Heavily calcified left coronary artery system was not able to be engaged due to inability to advance catheter to the left coronary cusp.   Not able to cross the aortic valve for measurement of the hemodynamics. Dominance: Right Heavily calcified somewhat tortuous RCA was difficult to engage: Prox RCA to Mid RCA lesion is 90% stenosed. Mid RCA lesion is 80% stenosed.Mid RCA lesion is 80% stenosed. Heavily calcified left coronary artery system was not able to be engaged due to inability to advance catheter to the left coronary cusp. Unable to engage the left coronary artery as the catheters were not advancing beyond the innominate artery and aortic knob. There is extreme tortuosity of the innominate artery and heavy calcifications not allowing the catheter be pushed freely through the artery and into the forearm or brachial aspect of the radial artery and worse around the tortuous innominate. RECOMMENDATIONS   Anticipated discharge date to be determined.   Recommend Aspirin  81mg  daily for moderate CAD.   Based on the presence of severe coronary disease at least in the one-vessel seen and heavy calcification in the left coronary system would strongly consider antiplatelet therapy with at a minimum aspirin  but even potentially Plavix based on elevated troponin levels.  No obvious invasive options for CAD management given lack of viable access. Randene Bustard, MD  Medications:    sodium chloride      sodium chloride      ceFEPime  (MAXIPIME ) IV Stopped (10/04/23 2206)   heparin  1,250 Units/hr (10/05/23 0800)   vancomycin       aspirin  EC  81 mg Oral Daily   atorvastatin   40 mg Oral Daily   Chlorhexidine  Gluconate Cloth  6 each Topical Q0600   insulin  aspart  0-9 Units Subcutaneous TID WC    sodium chloride  flush  3 mL Intravenous Q12H   sodium chloride , acetaminophen , diphenhydrAMINE , famotidine , fentaNYL  (SUBLIMAZE ) injection, gabapentin , Gerhardt's butt cream, methylPREDNISolone  (SOLU-MEDROL ) injection, midazolam , ondansetron  **OR** ondansetron  (ZOFRAN ) IV, sodium chloride  flush  Assessment/ Plan:  Ms. Amy Wu is a 73 y.o.  female with past medical history of hypertension, diabetes, PVD, dyslipidemia, and end-stage renal disease on dialysis. She presents to the ED via EMS for evaluation of altered mental status and fever and has been admitted under observation for Acute respiratory failure with hypoxia (HCC) [J96.01] Sepsis (HCC) [A41.9] Sepsis, due to unspecified organism, unspecified whether acute organ dysfunction present (HCC) [A41.9]  [facility]/MWF/Rt thigh Permcath  End stage renal disease on hemodialysis.  Last dialysis received for 4/15, line removed for line holiday after treatment.  Recent blood cultures remain negative.  Vascular surgery plans to replace tunneled access today.  Will reinitiate dialysis once placed.  Sepsis, unknown source at this time. Admitted with fever 103, and elevated white count, 12.2. Blood culture positive for serratia marcescens. ID request removal of tunneled catheter, removed on 4/15 with tip sent for culture. Catheter positive for same bacteria. Currently prescribed azithromycin  and cefepime .  Repeat blood cultures drawn on 4/16 negative thus far.  3. Acute respiratory failure requiring Bipap. Volume overload vs pneumonia.  Remains on 6 L nasal cannula  4. Anemia of chronic kidney disease Lab Results  Component Value Date   HGB 11.1 (L) 10/05/2023  Hemoglobin within optimal range. No acute indication for ESA   5. Secondary Hyperparathyroidism: with outpatient labs: None available   Lab Results  Component Value Date   CALCIUM  8.6 (L) 10/05/2023   PHOS 4.3 10/04/2023     Currently prescribed calcitriol  and  cinacalcet  outpatient.  Will continue to monitor these minerals during this admission.   LOS: 3 Prue Lingenfelter 4/18/202510:44 AM

## 2023-10-05 NOTE — Consult Note (Signed)
 Hospital Consult    Reason for Consult:  Dialysis Perma Catheter Placement  Requesting Physician:  Dave Erie NP  MRN #:  [id number]  History of Present Illness: This is a 73 y.o. female This is a 73 y.o. female presents to [facility] emergency room via EMS with increasing somnolence.  She states that she had not been feeling well for the past week.  When EMS arrived her temperature was 103 and she was given a fluid bolus.  Placed on 6 L nasal cannula oxygen.   Upon workup she was noted to have positive blood cultures.  She is noted to have Serratia on blood cultures.  Surgery was consulted to remove the new permacatheter that was placed last week by Dr. Devon Fogo on 09/24/2023. This was removed by the ICU team as the patient due to respiratory difficulties while up in ICU.  Today blood cultures have been negative again for 48 hours.  Vascular surgery is consulted for permacath dialysis access placement.  Past Medical History:  Diagnosis Date   Arthritis    Chronic kidney disease    Complication of anesthesia    Diabetes mellitus without complication (HCC)    Hyperlipidemia    Peripheral vascular disease (HCC)     Past Surgical History:  Procedure Laterality Date   A/V SHUNT INTERVENTION Left 11/20/2019   Procedure: A/V SHUNT INTERVENTION (Left Thigh);  Surgeon: Celso College, MD;  Location: [facility];  Service: Cardiovascular;  Laterality: Left;   A/V SHUNTOGRAM Left 02/16/2017   Procedure: A/V Shuntogram;  Surgeon: Celso College, MD;  Location: [facility];  Service: Cardiovascular;  Laterality: Left;   A/V SHUNTOGRAM Left 12/13/2017   Procedure: A/V SHUNTOGRAM;  Surgeon: Celso College, MD;  Location: [facility];  Service: Cardiovascular;  Laterality: Left;   A/V SHUNTOGRAM Left 12/05/2018   Procedure: A/V SHUNTOGRAM;  Surgeon: Celso College, MD;  Location: [facility];  Service: Cardiovascular;  Laterality: Left;   A/V SHUNTOGRAM Left 06/03/2020    Procedure: A/V SHUNTOGRAM;  Surgeon: Celso College, MD;  Location: [facility];  Service: Cardiovascular;  Laterality: Left;   A/V SHUNTOGRAM Left 05/24/2021   Procedure: A/V SHUNTOGRAM;  Surgeon: Jackquelyn Mass, MD;  Location: [facility];  Service: Cardiovascular;  Laterality: Left;  Thigh Graft   ABOVE KNEE LEG AMPUTATION     AV FISTULA PLACEMENT Bilateral    AV FISTULA PLACEMENT Left 07/01/2015   Procedure: INSERTION OF ARTERIOVENOUS (AV) GRAFT THIGH (ARTEGRAFT);  Surgeon: Celso College, MD;  Location: [facility];  Service: Vascular;  Laterality: Left;   BELOW KNEE LEG AMPUTATION Left    BREAST BIOPSY     CATARACT EXTRACTION W/PHACO Left 07/10/2022   Procedure: CATARACT EXTRACTION PHACO AND INTRAOCULAR LENS PLACEMENT (IOC) LEFT DIABETIC  12.98  01:13.7;  Surgeon: Rosa College, MD;  Location: [facility];  Service: Ophthalmology;  Laterality: Left;   DIALYSIS/PERMA CATHETER INSERTION N/A 05/26/2021   Procedure: DIALYSIS/PERMA CATHETER INSERTION;  Surgeon: Celso College, MD;  Location: [facility];  Service: Cardiovascular;  Laterality: N/A;   DIALYSIS/PERMA CATHETER INSERTION N/A 07/05/2022   Procedure: DIALYSIS/PERMA CATHETER INSERTION;  Surgeon: Celso College, MD;  Location: [facility];  Service: Cardiovascular;  Laterality: N/A;   DIALYSIS/PERMA CATHETER INSERTION N/A 07/28/2022   Procedure: DIALYSIS/PERMA CATHETER INSERTION;  Surgeon: Celso College, MD;  Location: [facility];  Service: Cardiovascular;  Laterality: N/A;  DIALYSIS/PERMA CATHETER INSERTION N/A 09/21/2022   Procedure: DIALYSIS/PERMA CATHETER INSERTION;  Surgeon: Celso College, MD;  Location: [facility];  Service: Cardiovascular;  Laterality: N/A;   DIALYSIS/PERMA CATHETER INSERTION N/A 02/15/2023   Procedure: DIALYSIS/PERMA CATHETER INSERTION;  Surgeon: Celso College, MD;  Location: [facility];  Service: Cardiovascular;  Laterality: N/A;   DIALYSIS/PERMA  CATHETER INSERTION N/A 04/09/2023   Procedure: DIALYSIS/PERMA CATHETER INSERTION;  Surgeon: Jackquelyn Mass, MD;  Location: [facility];  Service: Cardiovascular;  Laterality: N/A;   DIALYSIS/PERMA CATHETER REPAIR Right 09/24/2023   Procedure: DIALYSIS/PERMA CATHETER REPAIR;  Surgeon: Jackquelyn Mass, MD;  Location: [facility];  Service: Cardiovascular;  Laterality: Right;   ENDARTERECTOMY FEMORAL Left 07/01/2015   Procedure: SUPERFICIAL FEMORAL ENDARTERECTOMY ;  Surgeon: Celso College, MD;  Location: [facility];  Service: Vascular;  Laterality: Left;   EXCHANGE OF A DIALYSIS CATHETER  02/11/2015   Procedure: Exchange Of A Dialysis Catheter;  Surgeon: Celso College, MD;  Location: [facility];  Service: Cardiovascular;;   LEFT HEART CATH AND CORONARY ANGIOGRAPHY N/A 10/04/2023   Procedure: LEFT HEART CATH AND CORONARY ANGIOGRAPHY;  Surgeon: Arleen Lacer, MD;  Location: [facility];  Service: Cardiovascular;  Laterality: N/A;   LEG AMPUTATION ABOVE KNEE Right    PERIPHERAL VASCULAR CATHETERIZATION N/A 02/11/2015   Procedure: Dialysis/Perma Catheter Insertion;  Surgeon: Celso College, MD;  Location: [facility];  Service: Cardiovascular;  Laterality: N/A;   PERIPHERAL VASCULAR CATHETERIZATION N/A 05/11/2015   Procedure: Dialysis/Perma Catheter Insertion, exchange;  Surgeon: Jackquelyn Mass, MD;  Location: [facility];  Service: Cardiovascular;  Laterality: N/A;   PERIPHERAL VASCULAR CATHETERIZATION N/A 05/18/2015   Procedure: Dialysis/Perma Catheter Insertion;  Surgeon: Jackquelyn Mass, MD;  Location: [facility];  Service: Cardiovascular;  Laterality: N/A;   PERIPHERAL VASCULAR CATHETERIZATION Left 07/19/2015   Procedure: A/V Shuntogram/Fistulagram;  Surgeon: Celso College, MD;  Location: [facility];  Service: Cardiovascular;  Laterality: Left;   PERIPHERAL VASCULAR CATHETERIZATION N/A 07/19/2015   Procedure: A/V Shunt  Intervention;  Surgeon: Celso College, MD;  Location: [facility];  Service: Cardiovascular;  Laterality: N/A;   PERIPHERAL VASCULAR CATHETERIZATION N/A 08/26/2015   Procedure: Dialysis/Perma Catheter Removal;  Surgeon: Celso College, MD;  Location: [facility];  Service: Cardiovascular;  Laterality: N/A;   PERIPHERAL VASCULAR THROMBECTOMY N/A 12/29/2016   Procedure: Peripheral Vascular Thrombectomy;  Surgeon: Jackquelyn Mass, MD;  Location: [facility];  Service: Cardiovascular;  Laterality: N/A;    Allergies  Allergen Reactions   Ancef  [Cefazolin ] Palpitations   Contrast Media [Iodinated Contrast Media] Palpitations    Prior to Admission medications   Medication Sig Start Date End Date Taking? Authorizing Provider  acetaminophen  (TYLENOL ) 500 MG tablet Take 1,000 mg by mouth every 8 (eight) hours as needed for moderate pain.   Yes [provider]  aspirin  EC 81 MG tablet Take 81 mg by mouth daily. 11/11/20  Yes [provider]  atorvastatin  (LIPITOR) 40 MG tablet Take 40 mg by mouth daily.   Yes [provider]  fluticasone  (FLONASE ) 50 MCG/ACT nasal spray Place 1 spray into both nostrils daily.   Yes [provider]  gabapentin  (NEURONTIN ) 100 MG capsule Take 100 mg by mouth 3 (three) times daily as needed. M/W/F after dialysis PRN for leg/thigh cramps   Yes [provider]  glucose 4  GM chewable tablet Chew 1 tablet by mouth as needed for low blood sugar. If blood sugar less than 70. Chew 3 tablets and recheck blood sugar in 15 minutes   Yes [provider]  JANUVIA 25 MG tablet Take 25 mg by mouth daily. 11/02/20  Yes [provider]  lanthanum  (FOSRENOL ) 1000 MG chewable tablet Chew 1,000 mg by mouth 2 (two) times daily with a meal.    Yes [provider]  LOKELMA 10 g PACK packet Take 1 packet by mouth as directed. Taken on Sat and Sundays midday 09/25/23  Yes [provider]  loratadine   (CLARITIN ) 10 MG tablet Take 10 mg by mouth daily as needed for allergies.   Yes [provider]  Multiple Vitamins-Minerals (PRESERVISION AREDS 2) CAPS Take 1 capsule by mouth 2 (two) times daily. 09/18/23  Yes [provider]  senna-docusate (SENOKOT-S) 8.6-50 MG tablet Take 1-2 tablets by mouth at bedtime as needed for mild constipation or moderate constipation. 1-2 tablets at bedtime if needed   Yes [provider]  STOOL SOFTENER 100 MG capsule Take 100 mg by mouth 2 (two) times daily. 09/18/23  Yes [provider]  tiZANidine  (ZANAFLEX ) 4 MG tablet Take 4 mg by mouth 2 (two) times daily as needed for muscle spasms. 09/25/23  Yes [provider]  calcitRIOL  (ROCALTROL ) 0.25 MCG capsule Take 0.25 mcg by mouth daily. Take 3 capsules by mouth three times week at dialysis    [provider]  cinacalcet  (SENSIPAR ) 30 MG tablet Take 30 mg by mouth daily with supper.     [provider]  COMFORT EZ PEN NEEDLES 31G X 6 MM MISC Inject into the skin. 03/17/21   [provider]  ferrous sulfate  325 (65 FE) MG tablet Take 325 mg by mouth every other day. Patient not taking: Reported on 02/15/2023    [provider]  glucose blood test strip 1 each by Other route as needed for other. Use as instructed    [provider]  ketotifen  (ZADITOR ) 0.025 % ophthalmic solution Place 1 drop into both eyes 2 (two) times daily. Patient not taking: Reported on 04/09/2023    [provider]  ReliOn Lancet Devices 30G MISC Apply topically. Patient not taking: Reported on 06/30/2022 03/17/21   [provider]  TRUEplus Lancets 28G MISC Apply topically. 03/17/21   [provider]  Zebedee Hibbs INSULIN  SYRINGE 31G X 5/16" 0.3 ML MISC SMARTSIG:Injection Daily 03/31/21   [provider]    Social History   Socioeconomic History   Marital status: Single    Spouse name: Not on file   Number of children: Not on  file   Years of education: Not on file   Highest education level: Not on file  Occupational History   Not on file  Tobacco Use   Smoking status: Never   Smokeless tobacco: Never  Vaping Use   Vaping status: Never Used  Substance and Sexual Activity   Alcohol use: No   Drug use: No   Sexual activity: Not on file  Other Topics Concern   Not on file  Social History Narrative   Lovina Ruddle, friend, lives with and have been together 33 years.   Social Drivers of Corporate investment banker Strain: Not on file  Food Insecurity: No Food Insecurity (10/01/2023)   Hunger Vital Sign    Worried About Running Out of Food in the Last Year: Never true    Ran Out  of Food in the Last Year: Never true  Transportation Needs: No Transportation Needs (10/01/2023)   PRAPARE - Administrator, Civil Service (Medical): No    Lack of Transportation (Non-Medical): No  Physical Activity: Not on file  Stress: Not on file  Social Connections: Moderately Isolated (10/01/2023)   Social Connection and Isolation Panel [NHANES]    Frequency of Communication with Friends and Family: More than three times a week    Frequency of Social Gatherings with Friends and Family: More than three times a week    Attends Religious Services: Never    Database administrator or Organizations: No    Attends Banker Meetings: Never    Marital Status: Living with partner  Intimate Partner Violence: Not At Risk (10/01/2023)   Humiliation, Afraid, Rape, and Kick questionnaire    Fear of Current or Ex-Partner: No    Emotionally Abused: No    Physically Abused: No    Sexually Abused: No     Family History  Problem Relation Age of Onset   Breast cancer Neg Hx     ROS: Otherwise negative unless mentioned in HPI  Physical Examination  Vitals:   10/05/23 0800 10/05/23 0900  BP: (!) 158/110 (!) 164/80  Pulse:  65  Resp: 17 18  Temp: 97.9 F (36.6 C)   SpO2: 100% 100%   Body mass index is  45.99 kg/m.  General:  WDWN in NAD Gait: Not observed HENT: WNL, normocephalic Pulmonary: normal non-labored breathing, without Rales, rhonchi,  wheezing Cardiac: regular, without  Murmurs, rubs or gallops; without carotid bruits Abdomen: Positive bowel sounds throughout, soft, NT/ND, no masses Skin: without rashes Vascular Exam/Pulses: Bilateral AKA  Extremities: without ischemic changes, without Gangrene , without cellulitis; without open wounds;  Musculoskeletal: no muscle wasting or atrophy  Neurologic: A&O X 3;  No focal weakness or paresthesias are detected; speech is fluent/normal Psychiatric:  The pt has Normal affect. Lymph:  Unremarkable  CBC    Component Value Date/Time   WBC 7.4 10/05/2023 0556   RBC 4.39 10/05/2023 0556   HGB 11.1 (L) 10/05/2023 0556   HGB 12.4 12/03/2012 1414   HCT 33.4 (L) 10/05/2023 0556   HCT 38.0 12/03/2012 1414   PLT 137 (L) 10/05/2023 0556   PLT 193 12/03/2012 1414   MCV 76.1 (L) 10/05/2023 0556   MCV 81 12/03/2012 1414   MCH 25.3 (L) 10/05/2023 0556   MCHC 33.2 10/05/2023 0556   RDW 17.5 (H) 10/05/2023 0556   RDW 17.7 (H) 12/03/2012 1414   LYMPHSABS 0.7 10/01/2023 0912   MONOABS 0.8 10/01/2023 0912   EOSABS 0.0 10/01/2023 0912   BASOSABS 0.0 10/01/2023 0912    BMET    Component Value Date/Time   NA 133 (L) 10/05/2023 0556   NA 138 12/03/2012 1414   K 4.4 10/05/2023 0556   K 4.5 12/03/2012 1414   CL 98 10/05/2023 0556   CL 101 12/03/2012 1414   CO2 21 (L) 10/05/2023 0556   CO2 27 12/03/2012 1414   GLUCOSE 318 (H) 10/05/2023 0556   GLUCOSE 326 (H) 12/03/2012 1414   BUN 52 (H) 10/05/2023 0556   BUN 37 (H) 12/03/2012 1414   CREATININE 8.34 (H) 10/05/2023 0556   CREATININE 7.04 (H) 12/03/2012 1414   CALCIUM  8.6 (L) 10/05/2023 0556   CALCIUM  8.4 (L) 12/03/2012 1414   GFRNONAA 5 (L) 10/05/2023 0556   GFRNONAA 6 (L) 12/03/2012 1414   GFRAA 9 (L) 11/19/2019 1002  GFRAA 7 (L) 12/03/2012 1414    COAGS: Lab Results   Component Value Date   INR 1.2 10/03/2023   INR 1.4 (H) 10/01/2023   INR 1.1 05/23/2021     Non-Invasive Vascular Imaging:   None Ordered  Statin:  Yes.   Beta Blocker:  No. Aspirin :  Yes.   ACEI:  No. ARB:  No. CCB use:  No Other antiplatelets/anticoagulants:  No.    ASSESSMENT/PLAN: This is a 73 y.o. female who presented to [facility] emergency department in respiratory distress with sepsis.  She had positive blood cultures 3 days ago.  Her blood cultures are now negative x 48 hours.  Nephrology asks vascular surgery to place dialysis permacatheter for hemodialysis.  Vascular surgery plans on taking the patient to the vascular lab later today on 10/05/2023 for dialysis permacatheter placement.  I had a long detailed discussion at the bedside this morning with the patient regarding the procedure, benefits, risk, and complications.  She endorses she has had multiple permacatheter's in the past and is willing to proceed.  I answered all her questions this morning.  Patient has been n.p.o. since midnight last night.  She remains on heparin  infusion.   -I discussed the case in detail with Dr. Mikki Alexander MD and he agrees with plan   [name] [facility] 10/05/2023 9:20 AM

## 2023-10-05 NOTE — TOC CM/SW Note (Signed)
 Called Amy Wu with [facility] - informed her that per RN, patient wants a hospital bed ordered for home use. Amy Wu is updating the [facility] team and they will arrange this.  [name], LCSW Transitions of Care Department [phone]

## 2023-10-05 NOTE — Progress Notes (Signed)
 Patient transferred to procedural area with this nurse present for trip.

## 2023-10-05 NOTE — Progress Notes (Signed)
 Report received from [place], RN that patient had perm cath placed to the left groin and patient tolerated well.  Patient returned to unit and site is CDI.

## 2023-10-05 NOTE — H&P (View-Only) (Signed)
 Hospital Consult    Reason for Consult:  Dialysis Perma Catheter Placement  Requesting Physician:  Dave Erie NP  MRN #:  [id number]  History of Present Illness: This is a 73 y.o. female This is a 73 y.o. female presents to [facility] emergency room via EMS with increasing somnolence.  She states that she had not been feeling well for the past week.  When EMS arrived her temperature was 103 and she was given a fluid bolus.  Placed on 6 L nasal cannula oxygen.   Upon workup she was noted to have positive blood cultures.  She is noted to have Serratia on blood cultures.  Surgery was consulted to remove the new permacatheter that was placed last week by Dr. Devon Fogo on 09/24/2023. This was removed by the ICU team as the patient due to respiratory difficulties while up in ICU.  Today blood cultures have been negative again for 48 hours.  Vascular surgery is consulted for permacath dialysis access placement.  Past Medical History:  Diagnosis Date   Arthritis    Chronic kidney disease    Complication of anesthesia    Diabetes mellitus without complication (HCC)    Hyperlipidemia    Peripheral vascular disease (HCC)     Past Surgical History:  Procedure Laterality Date   A/V SHUNT INTERVENTION Left 11/20/2019   Procedure: A/V SHUNT INTERVENTION (Left Thigh);  Surgeon: Celso College, MD;  Location: [facility];  Service: Cardiovascular;  Laterality: Left;   A/V SHUNTOGRAM Left 02/16/2017   Procedure: A/V Shuntogram;  Surgeon: Celso College, MD;  Location: [facility];  Service: Cardiovascular;  Laterality: Left;   A/V SHUNTOGRAM Left 12/13/2017   Procedure: A/V SHUNTOGRAM;  Surgeon: Celso College, MD;  Location: [facility];  Service: Cardiovascular;  Laterality: Left;   A/V SHUNTOGRAM Left 12/05/2018   Procedure: A/V SHUNTOGRAM;  Surgeon: Celso College, MD;  Location: [facility];  Service: Cardiovascular;  Laterality: Left;   A/V SHUNTOGRAM Left 06/03/2020    Procedure: A/V SHUNTOGRAM;  Surgeon: Celso College, MD;  Location: [facility];  Service: Cardiovascular;  Laterality: Left;   A/V SHUNTOGRAM Left 05/24/2021   Procedure: A/V SHUNTOGRAM;  Surgeon: Jackquelyn Mass, MD;  Location: [facility];  Service: Cardiovascular;  Laterality: Left;  Thigh Graft   ABOVE KNEE LEG AMPUTATION     AV FISTULA PLACEMENT Bilateral    AV FISTULA PLACEMENT Left 07/01/2015   Procedure: INSERTION OF ARTERIOVENOUS (AV) GRAFT THIGH (ARTEGRAFT);  Surgeon: Celso College, MD;  Location: [facility];  Service: Vascular;  Laterality: Left;   BELOW KNEE LEG AMPUTATION Left    BREAST BIOPSY     CATARACT EXTRACTION W/PHACO Left 07/10/2022   Procedure: CATARACT EXTRACTION PHACO AND INTRAOCULAR LENS PLACEMENT (IOC) LEFT DIABETIC  12.98  01:13.7;  Surgeon: Rosa College, MD;  Location: [facility];  Service: Ophthalmology;  Laterality: Left;   DIALYSIS/PERMA CATHETER INSERTION N/A 05/26/2021   Procedure: DIALYSIS/PERMA CATHETER INSERTION;  Surgeon: Celso College, MD;  Location: [facility];  Service: Cardiovascular;  Laterality: N/A;   DIALYSIS/PERMA CATHETER INSERTION N/A 07/05/2022   Procedure: DIALYSIS/PERMA CATHETER INSERTION;  Surgeon: Celso College, MD;  Location: [facility];  Service: Cardiovascular;  Laterality: N/A;   DIALYSIS/PERMA CATHETER INSERTION N/A 07/28/2022   Procedure: DIALYSIS/PERMA CATHETER INSERTION;  Surgeon: Celso College, MD;  Location: [facility];  Service: Cardiovascular;  Laterality: N/A;  DIALYSIS/PERMA CATHETER INSERTION N/A 09/21/2022   Procedure: DIALYSIS/PERMA CATHETER INSERTION;  Surgeon: Celso College, MD;  Location: [facility];  Service: Cardiovascular;  Laterality: N/A;   DIALYSIS/PERMA CATHETER INSERTION N/A 02/15/2023   Procedure: DIALYSIS/PERMA CATHETER INSERTION;  Surgeon: Celso College, MD;  Location: [facility];  Service: Cardiovascular;  Laterality: N/A;   DIALYSIS/PERMA  CATHETER INSERTION N/A 04/09/2023   Procedure: DIALYSIS/PERMA CATHETER INSERTION;  Surgeon: Jackquelyn Mass, MD;  Location: [facility];  Service: Cardiovascular;  Laterality: N/A;   DIALYSIS/PERMA CATHETER REPAIR Right 09/24/2023   Procedure: DIALYSIS/PERMA CATHETER REPAIR;  Surgeon: Jackquelyn Mass, MD;  Location: [facility];  Service: Cardiovascular;  Laterality: Right;   ENDARTERECTOMY FEMORAL Left 07/01/2015   Procedure: SUPERFICIAL FEMORAL ENDARTERECTOMY ;  Surgeon: Celso College, MD;  Location: [facility];  Service: Vascular;  Laterality: Left;   EXCHANGE OF A DIALYSIS CATHETER  02/11/2015   Procedure: Exchange Of A Dialysis Catheter;  Surgeon: Celso College, MD;  Location: [facility];  Service: Cardiovascular;;   LEFT HEART CATH AND CORONARY ANGIOGRAPHY N/A 10/04/2023   Procedure: LEFT HEART CATH AND CORONARY ANGIOGRAPHY;  Surgeon: Arleen Lacer, MD;  Location: [facility];  Service: Cardiovascular;  Laterality: N/A;   LEG AMPUTATION ABOVE KNEE Right    PERIPHERAL VASCULAR CATHETERIZATION N/A 02/11/2015   Procedure: Dialysis/Perma Catheter Insertion;  Surgeon: Celso College, MD;  Location: [facility];  Service: Cardiovascular;  Laterality: N/A;   PERIPHERAL VASCULAR CATHETERIZATION N/A 05/11/2015   Procedure: Dialysis/Perma Catheter Insertion, exchange;  Surgeon: Jackquelyn Mass, MD;  Location: [facility];  Service: Cardiovascular;  Laterality: N/A;   PERIPHERAL VASCULAR CATHETERIZATION N/A 05/18/2015   Procedure: Dialysis/Perma Catheter Insertion;  Surgeon: Jackquelyn Mass, MD;  Location: [facility];  Service: Cardiovascular;  Laterality: N/A;   PERIPHERAL VASCULAR CATHETERIZATION Left 07/19/2015   Procedure: A/V Shuntogram/Fistulagram;  Surgeon: Celso College, MD;  Location: [facility];  Service: Cardiovascular;  Laterality: Left;   PERIPHERAL VASCULAR CATHETERIZATION N/A 07/19/2015   Procedure: A/V Shunt  Intervention;  Surgeon: Celso College, MD;  Location: [facility];  Service: Cardiovascular;  Laterality: N/A;   PERIPHERAL VASCULAR CATHETERIZATION N/A 08/26/2015   Procedure: Dialysis/Perma Catheter Removal;  Surgeon: Celso College, MD;  Location: [facility];  Service: Cardiovascular;  Laterality: N/A;   PERIPHERAL VASCULAR THROMBECTOMY N/A 12/29/2016   Procedure: Peripheral Vascular Thrombectomy;  Surgeon: Jackquelyn Mass, MD;  Location: [facility];  Service: Cardiovascular;  Laterality: N/A;    Allergies  Allergen Reactions   Ancef  [Cefazolin ] Palpitations   Contrast Media [Iodinated Contrast Media] Palpitations    Prior to Admission medications   Medication Sig Start Date End Date Taking? Authorizing Provider  acetaminophen  (TYLENOL ) 500 MG tablet Take 1,000 mg by mouth every 8 (eight) hours as needed for moderate pain.   Yes [provider]  aspirin  EC 81 MG tablet Take 81 mg by mouth daily. 11/11/20  Yes [provider]  atorvastatin  (LIPITOR) 40 MG tablet Take 40 mg by mouth daily.   Yes [provider]  fluticasone  (FLONASE ) 50 MCG/ACT nasal spray Place 1 spray into both nostrils daily.   Yes [provider]  gabapentin  (NEURONTIN ) 100 MG capsule Take 100 mg by mouth 3 (three) times daily as needed. M/W/F after dialysis PRN for leg/thigh cramps   Yes [provider]  glucose 4  GM chewable tablet Chew 1 tablet by mouth as needed for low blood sugar. If blood sugar less than 70. Chew 3 tablets and recheck blood sugar in 15 minutes   Yes [provider]  JANUVIA 25 MG tablet Take 25 mg by mouth daily. 11/02/20  Yes [provider]  lanthanum  (FOSRENOL ) 1000 MG chewable tablet Chew 1,000 mg by mouth 2 (two) times daily with a meal.    Yes [provider]  LOKELMA 10 g PACK packet Take 1 packet by mouth as directed. Taken on Sat and Sundays midday 09/25/23  Yes [provider]  loratadine   (CLARITIN ) 10 MG tablet Take 10 mg by mouth daily as needed for allergies.   Yes [provider]  Multiple Vitamins-Minerals (PRESERVISION AREDS 2) CAPS Take 1 capsule by mouth 2 (two) times daily. 09/18/23  Yes [provider]  senna-docusate (SENOKOT-S) 8.6-50 MG tablet Take 1-2 tablets by mouth at bedtime as needed for mild constipation or moderate constipation. 1-2 tablets at bedtime if needed   Yes [provider]  STOOL SOFTENER 100 MG capsule Take 100 mg by mouth 2 (two) times daily. 09/18/23  Yes [provider]  tiZANidine  (ZANAFLEX ) 4 MG tablet Take 4 mg by mouth 2 (two) times daily as needed for muscle spasms. 09/25/23  Yes [provider]  calcitRIOL  (ROCALTROL ) 0.25 MCG capsule Take 0.25 mcg by mouth daily. Take 3 capsules by mouth three times week at dialysis    [provider]  cinacalcet  (SENSIPAR ) 30 MG tablet Take 30 mg by mouth daily with supper.     [provider]  COMFORT EZ PEN NEEDLES 31G X 6 MM MISC Inject into the skin. 03/17/21   [provider]  ferrous sulfate  325 (65 FE) MG tablet Take 325 mg by mouth every other day. Patient not taking: Reported on 02/15/2023    [provider]  glucose blood test strip 1 each by Other route as needed for other. Use as instructed    [provider]  ketotifen  (ZADITOR ) 0.025 % ophthalmic solution Place 1 drop into both eyes 2 (two) times daily. Patient not taking: Reported on 04/09/2023    [provider]  ReliOn Lancet Devices 30G MISC Apply topically. Patient not taking: Reported on 06/30/2022 03/17/21   [provider]  TRUEplus Lancets 28G MISC Apply topically. 03/17/21   [provider]  Zebedee Hibbs INSULIN  SYRINGE 31G X 5/16" 0.3 ML MISC SMARTSIG:Injection Daily 03/31/21   [provider]    Social History   Socioeconomic History   Marital status: Single    Spouse name: Not on file   Number of children: Not on  file   Years of education: Not on file   Highest education level: Not on file  Occupational History   Not on file  Tobacco Use   Smoking status: Never   Smokeless tobacco: Never  Vaping Use   Vaping status: Never Used  Substance and Sexual Activity   Alcohol use: No   Drug use: No   Sexual activity: Not on file  Other Topics Concern   Not on file  Social History Narrative   Lovina Ruddle, friend, lives with and have been together 33 years.   Social Drivers of Corporate investment banker Strain: Not on file  Food Insecurity: No Food Insecurity (10/01/2023)   Hunger Vital Sign    Worried About Running Out of Food in the Last Year: Never true    Ran Out  of Food in the Last Year: Never true  Transportation Needs: No Transportation Needs (10/01/2023)   PRAPARE - Administrator, Civil Service (Medical): No    Lack of Transportation (Non-Medical): No  Physical Activity: Not on file  Stress: Not on file  Social Connections: Moderately Isolated (10/01/2023)   Social Connection and Isolation Panel [NHANES]    Frequency of Communication with Friends and Family: More than three times a week    Frequency of Social Gatherings with Friends and Family: More than three times a week    Attends Religious Services: Never    Database administrator or Organizations: No    Attends Banker Meetings: Never    Marital Status: Living with partner  Intimate Partner Violence: Not At Risk (10/01/2023)   Humiliation, Afraid, Rape, and Kick questionnaire    Fear of Current or Ex-Partner: No    Emotionally Abused: No    Physically Abused: No    Sexually Abused: No     Family History  Problem Relation Age of Onset   Breast cancer Neg Hx     ROS: Otherwise negative unless mentioned in HPI  Physical Examination  Vitals:   10/05/23 0800 10/05/23 0900  BP: (!) 158/110 (!) 164/80  Pulse:  65  Resp: 17 18  Temp: 97.9 F (36.6 C)   SpO2: 100% 100%   Body mass index is  45.99 kg/m.  General:  WDWN in NAD Gait: Not observed HENT: WNL, normocephalic Pulmonary: normal non-labored breathing, without Rales, rhonchi,  wheezing Cardiac: regular, without  Murmurs, rubs or gallops; without carotid bruits Abdomen: Positive bowel sounds throughout, soft, NT/ND, no masses Skin: without rashes Vascular Exam/Pulses: Bilateral AKA  Extremities: without ischemic changes, without Gangrene , without cellulitis; without open wounds;  Musculoskeletal: no muscle wasting or atrophy  Neurologic: A&O X 3;  No focal weakness or paresthesias are detected; speech is fluent/normal Psychiatric:  The pt has Normal affect. Lymph:  Unremarkable  CBC    Component Value Date/Time   WBC 7.4 10/05/2023 0556   RBC 4.39 10/05/2023 0556   HGB 11.1 (L) 10/05/2023 0556   HGB 12.4 12/03/2012 1414   HCT 33.4 (L) 10/05/2023 0556   HCT 38.0 12/03/2012 1414   PLT 137 (L) 10/05/2023 0556   PLT 193 12/03/2012 1414   MCV 76.1 (L) 10/05/2023 0556   MCV 81 12/03/2012 1414   MCH 25.3 (L) 10/05/2023 0556   MCHC 33.2 10/05/2023 0556   RDW 17.5 (H) 10/05/2023 0556   RDW 17.7 (H) 12/03/2012 1414   LYMPHSABS 0.7 10/01/2023 0912   MONOABS 0.8 10/01/2023 0912   EOSABS 0.0 10/01/2023 0912   BASOSABS 0.0 10/01/2023 0912    BMET    Component Value Date/Time   NA 133 (L) 10/05/2023 0556   NA 138 12/03/2012 1414   K 4.4 10/05/2023 0556   K 4.5 12/03/2012 1414   CL 98 10/05/2023 0556   CL 101 12/03/2012 1414   CO2 21 (L) 10/05/2023 0556   CO2 27 12/03/2012 1414   GLUCOSE 318 (H) 10/05/2023 0556   GLUCOSE 326 (H) 12/03/2012 1414   BUN 52 (H) 10/05/2023 0556   BUN 37 (H) 12/03/2012 1414   CREATININE 8.34 (H) 10/05/2023 0556   CREATININE 7.04 (H) 12/03/2012 1414   CALCIUM  8.6 (L) 10/05/2023 0556   CALCIUM  8.4 (L) 12/03/2012 1414   GFRNONAA 5 (L) 10/05/2023 0556   GFRNONAA 6 (L) 12/03/2012 1414   GFRAA 9 (L) 11/19/2019 1002  GFRAA 7 (L) 12/03/2012 1414    COAGS: Lab Results   Component Value Date   INR 1.2 10/03/2023   INR 1.4 (H) 10/01/2023   INR 1.1 05/23/2021     Non-Invasive Vascular Imaging:   None Ordered  Statin:  Yes.   Beta Blocker:  No. Aspirin :  Yes.   ACEI:  No. ARB:  No. CCB use:  No Other antiplatelets/anticoagulants:  No.    ASSESSMENT/PLAN: This is a 73 y.o. female who presented to [facility] emergency department in respiratory distress with sepsis.  She had positive blood cultures 3 days ago.  Her blood cultures are now negative x 48 hours.  Nephrology asks vascular surgery to place dialysis permacatheter for hemodialysis.  Vascular surgery plans on taking the patient to the vascular lab later today on 10/05/2023 for dialysis permacatheter placement.  I had a long detailed discussion at the bedside this morning with the patient regarding the procedure, benefits, risk, and complications.  She endorses she has had multiple permacatheter's in the past and is willing to proceed.  I answered all her questions this morning.  Patient has been n.p.o. since midnight last night.  She remains on heparin  infusion.   -I discussed the case in detail with Dr. Mikki Alexander MD and he agrees with plan   [name] 10/05/2023 9:20 AM

## 2023-10-05 NOTE — Progress Notes (Signed)
 Patient Name: Suzzane G Stipp Date of Encounter: 10/05/2023  [facility] Cardiologist: Belva Boyden, MD   Interval Summary  .    Patient feels very well today.  She denies chest pain and shortness of breath.  She is scheduled for PermCath placement today with vascular surgery.  Catheterization yesterday was very challenging due to significant calcification of the right radial artery and tortuosity of the right subclavian/brachiocephalic artery.  Only the right coronary artery was able to be visualized.  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  Vital Signs .    Vitals:   10/05/23 0716 10/05/23 0800 10/05/23 0900 10/05/23 1052  BP:  (!) 158/110 (!) 164/80 (!) 155/72  Pulse:   65 63  Resp: 12 17 18    Temp:  97.9 F (36.6 C)    TempSrc:  Axillary    SpO2:  100% 100% 96%  Weight:        Intake/Output Summary (Last 24 hours) at 10/05/2023 1104 Last data filed at 10/05/2023 0800 Gross per 24 hour  Intake 491.5 ml  Output --  Net 491.5 ml      10/05/2023    5:00 AM 10/04/2023    5:00 AM 10/02/2023    5:55 PM  Last 3 Weights  Weight (lbs) 212 lb 8.4 oz 220 lb 3.8 oz 220 lb 3.8 oz  Weight (kg) 96.4 kg 99.9 kg 99.9 kg      Telemetry/ECG    Sinus rhythm with PACs, some of which are blocked.- Personally Reviewed  Physical Exam .   GEN: No acute distress.   Neck: No JVD Cardiac: RRR, no murmurs, rubs, or gallops.  Respiratory: Clear to auscultation bilaterally. GI: Soft, nontender, non-distended  MS: Patient is status post bilateral AKA's.  No significant stump edema noted.  Right radial catheterization site covered with clean dressing.  Assessment & Plan .     NSTEMI: Ms. Cly is feeling very well.  She denies ever having had chest pain, though she had altered mental status in the setting of hypotension and Serratia bacteremia when she first presented.  Echo was poor quality but showed LVEF greater than 55%.  Catheterization was attempted yesterday but was very  challenging due to difficult vascular anatomy.  Only the right coronary artery could be engaged and was found to have sequential 80 to 90% lesions.  I have discussed her case with Dr. Addie Holstein, who performed the catheterization yesterday.  She has very limited vascular access options given that both upper and lower extremities have been used for dialysis fistula/grafts in the past.  I have also reviewed her unenhanced CT of the abdomen and pelvis from 2022 which shows severe calcification of the distal aorta and bilateral iliac and common femoral arteries.  I have recommended continued medical therapy and my discussion with Ms. Sibal as well as her son (by phone).  We will initiate aspirin  81 mg daily today and consider adding clopidogrel if hemoglobin remains stable.  48-hour heparin  infusion to be complete this afternoon.  We will also resume a statin.  Ms. Marksberry and her son both voice a desire to do what ever is necessary to maximize her longevity and quality of life.  If she were to develop chest pain or recurrent elevated troponins, we would need to reconsider catheterization, though she would be at very high risk for vascular complications.  Consider addition of amlodipine for antianginal therapy and blood pressure control if blood pressure remains elevated.  Septic shock and Serratia bacteremia:  Blood pressure improving, now somewhat high.  If blood pressure continues to trend up, addition of amlodipine should be considered.  I will defer adding a beta-blocker in the setting of baseline bradycardia with resting heart rates in the low 60s.  Daxson Reffett-stage renal disease: Permacatheter placement planned for later today with vascular surgery.  For questions or updates, please contact [facility] Please consult [email or web address] for contact info under [facility].  Signed, Sammy Crisp, MD

## 2023-10-05 NOTE — Op Note (Signed)
 OPERATIVE NOTE    PRE-OPERATIVE DIAGNOSIS: 1. ESRD  POST-OPERATIVE DIAGNOSIS: same as above  PROCEDURE: Ultrasound guidance for vascular access to the left femoral  vein Fluoroscopic guidance for placement of catheter Placement of a 44 cm tip to cuff tunneled hemodialysis catheter via the left femoral vein  SURGEON: Mikki Alexander, MD  ANESTHESIA:  Local with moderate conscious sedation for approximately 31 minutes using 2 mg of Versed  and 50 mcg of Fentanyl   ESTIMATED BLOOD LOSS: 3 cc  FINDING(S): 1.  Patent left femoral vein   SPECIMEN(S):  None  INDICATIONS:   Patient is a 73 y.o.female who presents with limited access options and recent permcath infection in the right groin.   The patient needs long term dialysis access for their ESRD, and a Permcath is necessary.  Risks and benefits are discussed and informed consent is obtained.    DESCRIPTION: After obtaining full informed written consent, the patient was brought back to the vascular suited. Moderate conscious sedation was administered throughout the procedure with a face-to-face encounter with my presence for the entire procedure and with my supervision of the RN monitoring the patient's vital signs, pulse oximetry, telemetry, and mental status throughout the procedure. The patient's left groin was sterilely prepped and draped and a sterile surgical field was created.  The left femoral vein was visualized with ultrasound and found to be patent. It was then accessed under direct ultrasound guidance and a permanent image was recorded. A wire was placed. After skin nick and dilatation, the peel-away sheath was placed over the wire. I then turned my attention to an area about 5 cm inferior and lateral to the access incision and a small counterincision was created.  I tunneled from the counter  incision to the access site. Using fluoroscopic guidance, a 44 centimeterer tip to cuff tunneled hemodialysis catheter was selected, and tunneled  from the counter  incision to the access site. It was then placed through the peel-away sheath and the peel-away sheath was removed. Using fluoroscopic guidance the catheter tips were parked in the IVC just below the right atrium. The appropriate distal connectors were placed. It withdrew blood well and flushed easily with heparinized saline and a concentrated heparin  solution was then placed. It was secured to the leg  with 2 Prolene sutures. The access incision was closed single 4-0 Monocryl. A 4-0 Monocryl pursestring suture was placed around the exit site. Sterile dressings were placed. The patient tolerated the procedure well and was taken to the recovery room in stable condition.  COMPLICATIONS: None  CONDITION: Stable    Mikki Alexander 10/05/2023 12:12 PM   This note was created with Dragon Medical transcription system. Any errors in dictation are purely unintentional.

## 2023-10-05 NOTE — Plan of Care (Signed)
  Problem: Education: Goal: Knowledge of General Education information will improve Description: Including pain rating scale, medication(s)/side effects and non-pharmacologic comfort measures Outcome: Progressing   Problem: Health Behavior/Discharge Planning: Goal: Ability to manage health-related needs will improve Outcome: Progressing   Problem: Clinical Measurements: Goal: Ability to maintain clinical measurements within normal limits will improve Outcome: Progressing Goal: Will remain free from infection Outcome: Progressing Goal: Diagnostic test results will improve Outcome: Progressing Goal: Respiratory complications will improve Outcome: Progressing   Problem: Activity: Goal: Risk for activity intolerance will decrease Outcome: Progressing   Problem: Nutrition: Goal: Adequate nutrition will be maintained Outcome: Progressing   Problem: Coping: Goal: Level of anxiety will decrease Outcome: Progressing   Problem: Elimination: Goal: Will not experience complications related to urinary retention Outcome: Progressing   Problem: Safety: Goal: Ability to remain free from injury will improve Outcome: Progressing   Problem: Skin Integrity: Goal: Risk for impaired skin integrity will decrease Outcome: Progressing   Problem: Fluid Volume: Goal: Hemodynamic stability will improve Outcome: Progressing   Problem: Respiratory: Goal: Ability to maintain adequate ventilation will improve Outcome: Progressing   Problem: Nutritional: Goal: Maintenance of adequate nutrition will improve Outcome: Progressing   Problem: Education: Goal: Understanding of CV disease, CV risk reduction, and recovery process will improve Outcome: Progressing   Problem: Activity: Goal: Ability to return to baseline activity level will improve Outcome: Progressing   Problem: Cardiovascular: Goal: Ability to achieve and maintain adequate cardiovascular perfusion will improve Outcome:  Progressing

## 2023-10-06 DIAGNOSIS — I483 Typical atrial flutter: Secondary | ICD-10-CM

## 2023-10-06 DIAGNOSIS — E1122 Type 2 diabetes mellitus with diabetic chronic kidney disease: Secondary | ICD-10-CM

## 2023-10-06 DIAGNOSIS — I214 Non-ST elevation (NSTEMI) myocardial infarction: Secondary | ICD-10-CM | POA: Diagnosis not present

## 2023-10-06 DIAGNOSIS — J9601 Acute respiratory failure with hypoxia: Secondary | ICD-10-CM | POA: Diagnosis not present

## 2023-10-06 DIAGNOSIS — Z794 Long term (current) use of insulin: Secondary | ICD-10-CM

## 2023-10-06 LAB — CBC
HCT: 30.9 % — ABNORMAL LOW (ref 36.0–46.0)
Hemoglobin: 10.6 g/dL — ABNORMAL LOW (ref 12.0–15.0)
MCH: 25.7 pg — ABNORMAL LOW (ref 26.0–34.0)
MCHC: 34.3 g/dL (ref 30.0–36.0)
MCV: 74.8 fL — ABNORMAL LOW (ref 80.0–100.0)
Platelets: 181 10*3/uL (ref 150–400)
RBC: 4.13 MIL/uL (ref 3.87–5.11)
RDW: 17 % — ABNORMAL HIGH (ref 11.5–15.5)
WBC: 10.7 10*3/uL — ABNORMAL HIGH (ref 4.0–10.5)
nRBC: 0 % (ref 0.0–0.2)

## 2023-10-06 LAB — GLUCOSE, CAPILLARY
Glucose-Capillary: 430 mg/dL — ABNORMAL HIGH (ref 70–99)
Glucose-Capillary: 452 mg/dL — ABNORMAL HIGH (ref 70–99)
Glucose-Capillary: 150 mg/dL — ABNORMAL HIGH (ref 70–99)
Glucose-Capillary: 202 mg/dL — ABNORMAL HIGH (ref 70–99)

## 2023-10-06 LAB — BASIC METABOLIC PANEL WITH GFR
Anion gap: 14 (ref 5–15)
BUN: 60 mg/dL — ABNORMAL HIGH (ref 8–23)
CO2: 21 mmol/L — ABNORMAL LOW (ref 22–32)
Calcium: 8.7 mg/dL — ABNORMAL LOW (ref 8.9–10.3)
Chloride: 97 mmol/L — ABNORMAL LOW (ref 98–111)
Creatinine, Ser: 9.06 mg/dL — ABNORMAL HIGH (ref 0.44–1.00)
GFR, Estimated: 4 mL/min — ABNORMAL LOW (ref >60)
Glucose, Bld: 414 mg/dL — ABNORMAL HIGH (ref 70–99)
Potassium: 4.1 mmol/L (ref 3.5–5.1)
Sodium: 132 mmol/L — ABNORMAL LOW (ref 135–145)

## 2023-10-06 LAB — PROTIME-INR
INR: 1.2 (ref 0.8–1.2)
Prothrombin Time: 15.7 s — ABNORMAL HIGH (ref 11.4–15.2)

## 2023-10-06 LAB — APTT: aPTT: 158 s — ABNORMAL HIGH (ref 24–36)

## 2023-10-06 MED ORDER — INSULIN ASPART 100 UNIT/ML IJ SOLN
0.0000 [IU] | Freq: Three times a day (TID) | INTRAMUSCULAR | Status: DC
Start: 2023-10-06 — End: 2023-10-13
  Administered 2023-10-06: 15 [IU] via SUBCUTANEOUS
  Administered 2023-10-06: 2 [IU] via SUBCUTANEOUS
  Administered 2023-10-07: 5 [IU] via SUBCUTANEOUS
  Administered 2023-10-07: 8 [IU] via SUBCUTANEOUS
  Administered 2023-10-07: 11 [IU] via SUBCUTANEOUS
  Administered 2023-10-08: 5 [IU] via SUBCUTANEOUS
  Administered 2023-10-09: 3 [IU] via SUBCUTANEOUS
  Administered 2023-10-09 – 2023-10-10 (×2): 2 [IU] via SUBCUTANEOUS
  Administered 2023-10-11 – 2023-10-12 (×2): 3 [IU] via SUBCUTANEOUS
  Administered 2023-10-12: 5 [IU] via SUBCUTANEOUS
  Filled 2023-10-06 (×12): qty 1

## 2023-10-06 MED ORDER — INSULIN ASPART 100 UNIT/ML FLEXPEN: PEN_INJECTOR | SUBCUTANEOUS | 0 refills | Status: DC

## 2023-10-06 MED ORDER — INSULIN ASPART 100 UNIT/ML IJ SOLN
10.0000 [IU] | Freq: Once | INTRAMUSCULAR | Status: AC
  Administered 2023-10-06: 10 [IU] via SUBCUTANEOUS
  Filled 2023-10-06: qty 1

## 2023-10-06 MED ORDER — AMIODARONE HCL 200 MG PO TABS
200.0000 mg | ORAL_TABLET | Freq: Two times a day (BID) | ORAL | Status: DC
  Administered 2023-10-06 – 2023-10-12 (×11): 200 mg via ORAL
  Filled 2023-10-06 (×12): qty 1

## 2023-10-06 MED ORDER — AMIODARONE HCL 200 MG PO TABS: 200.0000 mg | ORAL_TABLET | Freq: Two times a day (BID) | ORAL | 0 refills | Status: DC

## 2023-10-06 MED ORDER — HEPARIN (PORCINE) 25000 UT/250ML-% IV SOLN
1250.0000 [IU]/h | INTRAVENOUS | Status: DC
  Administered 2023-10-06: 1250 [IU]/h via INTRAVENOUS
  Filled 2023-10-06 (×3): qty 250

## 2023-10-06 MED ORDER — ATORVASTATIN CALCIUM 20 MG PO TABS
80.0000 mg | ORAL_TABLET | Freq: Every day | ORAL | Status: DC
  Administered 2023-10-07 – 2023-10-12 (×7): 80 mg via ORAL
  Filled 2023-10-06 (×6): qty 4

## 2023-10-06 MED ORDER — HEPARIN SODIUM (PORCINE) 1000 UNIT/ML DIALYSIS: 1000.0000 [IU] | INTRAMUSCULAR | Status: DC | PRN

## 2023-10-06 MED ORDER — HEPARIN BOLUS VIA INFUSION
3000.0000 [IU] | Freq: Once | INTRAVENOUS | Status: AC
  Administered 2023-10-06: 3000 [IU] via INTRAVENOUS
  Filled 2023-10-06: qty 3000

## 2023-10-06 MED ORDER — HEPARIN SODIUM (PORCINE) 1000 UNIT/ML IJ SOLN
INTRAMUSCULAR | Status: AC
  Filled 2023-10-06: qty 10

## 2023-10-06 MED ORDER — INSULIN ASPART 100 UNIT/ML IJ SOLN
8.0000 [IU] | Freq: Three times a day (TID) | INTRAMUSCULAR | Status: DC
  Administered 2023-10-06: 8 [IU] via SUBCUTANEOUS
  Filled 2023-10-06: qty 1

## 2023-10-06 MED ORDER — ALTEPLASE 2 MG IJ SOLR: 2.0000 mg | Freq: Once | INTRAMUSCULAR | Status: DC | PRN

## 2023-10-06 MED ORDER — INSULIN ASPART 100 UNIT/ML IJ SOLN
0.0000 [IU] | Freq: Every day | INTRAMUSCULAR | Status: DC
  Administered 2023-10-07: 2 [IU] via SUBCUTANEOUS
  Filled 2023-10-06 (×3): qty 1

## 2023-10-06 NOTE — Progress Notes (Signed)
 PROGRESS NOTE    Amy Wu   KZS:[id number] DOB: 12/06/1950  DOA: 10/01/2023 Date of Service: 10/06/23 which is hospital day 4  PCP: Izella Marshal, Jill E, [facility] course / significant events:   HPI:  Amy Wu is a 73 y.o. female with medical history significant of end-stage renal disease on hemodialysis MWF, type 2 diabetes, hyperlipidemia, peripheral vascular disease presenting to ED 10/01/23 from home with generalized malaise and fevers, confusion per family, mild cough/SOB. No abdominal pain but some diarrhea. Missed hemodialysis 04/13 secondary to symptoms.   04/14: admitted to hospitalist service for sepsis as well as acute resp fail w/ hypoxia and concern for volume overload. Requiring BiPAP  04/15: (+)bacteremia w/ serratia, abx to cefepime . Pt transferred to the stepdown unit for pressors. ID consult, HD catheter removed due to likely source of sepsis. Elevated troponins, started heparin   04/16: Off levo later today and onto scheduled midodrine . Echo ordered. Cardiology consulted, continue heparin  infusion, not on beta-blocker secondary to bradycardia, scheduled for cardiac cath on 04/17. Transthoracic echo ok. Repeating BCx today. Plan reinsertion HD cath 04/18.  04/17: LHC today. Significant CAD, no PCI 04/18: L femoral dialysis cath placed, planning dialysis tomorrow. Glc substantial increase, adjusting sliding scale  04/19: New onset atrial flutter, cardiology following, started heparin  and amiodarone . Dialysis today. Still quite hyperglycemic.    Consultants:  Infectious disease Vascular surgery  [facility] Cardiology  Procedures/Surgeries: 10/04/23 left heart cath   10/05/23 placement tunneled hemodialysis catheter via the left femoral vein       ASSESSMENT & PLAN:   Severe Sepsis w/ Serratia bacteremia likely source HD catheter which has been removed Complicated by septic shock, see below  s/p HD catheter placement 10/05/23 following neg repeat  BCx Cefepime  to continue at dialysis outpatient, see med rec, confirmed w/ nephrology this has been ordered   Septic shock requiring vasopressor support, shock resolved and now off IV pressors Treat underlying cause(s) as noted midodrine   New atrial flutter New onset this morning 04/19 Documented on EKG today Rate controlled in the 70s Asymptomatic start amiodarone  200 twice daily heparin  infusion, plan eliquis  on discharge  Consider cardioversion at a later date if she remains in atrial flutter  Elevated troponin secondary NSTEMI vs. demand ischemia  Coronary Artery Disease w/ extensive calcification / atherosclerotic disease  Hx: HTN, HLD and PVD Echo 10/02/23: EF >55%; mild tricuspid valve regurgitation; trivial pulmonic valve regurgitation  Cardiac catheterization was attempted 10/04/23 but was very challenging due to difficult vascular anatomy. Only the right coronary artery could be engaged and was found to have sequential 80 to 90% lesions.  aspirin  81 mg daily  consider adding clopidogrel if hemoglobin remains stable but now will likely need anticoagulation w/ Eliquis  d/t aflutter see above  atorvastatin   Beta blocker has caused bradycardia/hypotension, defer this medication    Acute hypoxic respiratory failure - improved  Likely d/t pulmonary vascular congestion d/t missed dialysis Supplemental O2 for dyspnea and/or hypoxia  Maintain O2 sats 92% or higher  Fluid removal with HD   Acute metabolic encephalopathy d/t severe sepsis - resolved  Treat underlying cause(s) as noted Correct metabolic derangements  Avoid sedating medications as able  Maintain sleep/wake cycle     ESRD on HD (M-W-F) Hyponatremia s/p HD catheter placement 10/05/23 Trend BMP  Replace electrolytes as indicated  Nephrology consulted appreciate input   Elevated AST  Trend hepatic function panel  Avoid hepatotoxic agents as able    Type II diabetes  mellitus  PT had previously been d/c  insulin  outpatient d/t severe hypoglycemia, was normoglycemic initially and A1C <7 however Glc past few days has been quite high, diet has been non-carb-controlled,  CBG's ac/hs/prn Updated diet to renal/carb   SSI increased  Target CBG's 140 to 180     Class 3 obesity based on BMI: Body mass index is 47.66 kg/m.  Underweight - under 18  overweight - 25 to 29 obese - 30 or more Class 1 obesity: BMI of 30.0 to 34 Class 2 obesity: BMI of 35.0 to 39 Class 3 obesity: BMI of 40.0 to 49 Super Morbid Obesity: BMI 50-59 Super-super Morbid Obesity: BMI 60+ Significantly low or high BMI is associated with higher medical risk.  Weight management advised as adjunct to other disease management and risk reduction treatments    DVT prophylaxis: on heparin  gtt  IV fluids: no continuous IV fluids  Nutrition: NPO pending cardiac cath Central lines / other devices: none  Code Status: DNR ACP documentation reviewed:  none on file in [facility]  TOC needs: TBD Medical barriers to dispo: new aflutter, hyperglycemia. Expected medical readiness for discharge no sooner than 04/20              Subjective / Brief ROS:  Patient reports feeling good today, examined in hospital room this morning and in dialysis later today  Denies CP/SOB.  Pain controlled.  Denies new weakness.  She really wants to go home  We discussed sliding scale insulin  this morning but she could not really recall that conversation later in the day.   Family Communication: family at bedside on rounds    Objective Findings:  Vitals:   10/06/23 1530 10/06/23 1600 10/06/23 1630 10/06/23 1648  BP: 101/72 123/84 92/70 (!) 156/81  Pulse: 76 79 74 74  Resp: (!) 24 16 17 17   Temp:      TempSrc:      SpO2: 100% 100% 100% 100%  Weight:      Height:  4\' 9"  (1.448 m)      Intake/Output Summary (Last 24 hours) at 10/06/2023 1654 Last data filed at 10/06/2023 2956 Gross per 24 hour  Intake 100 ml  Output --  Net 100 ml    Filed Weights   10/05/23 0500 10/06/23 0630 10/06/23 1256  Weight: 96.4 kg (P) 98.2 kg 97.4 kg    Examination:  Physical Exam Constitutional:      General: She is not in acute distress. Cardiovascular:     Rate and Rhythm: Normal rate and regular rhythm.  Pulmonary:     Effort: Pulmonary effort is normal.     Breath sounds: Normal breath sounds.  Abdominal:     Palpations: Abdomen is soft.  Skin:    General: Skin is warm and dry.  Neurological:     General: No focal deficit present.     Mental Status: She is alert and oriented to person, place, and time. Mental status is at baseline.     Comments: We discussed sliding scale insulin  this morning but she could not really recall that conversation later in the day. She seems a bit more confused in dialysis though she is oriented. She is tangential, I ask about what we talked about this morning and she just talks about what she's had to eat today that maybe made her sugars high   Psychiatric:        Mood and Affect: Mood normal.        Behavior: Behavior normal.  Scheduled Medications:   amiodarone   200 mg Oral BID   aspirin  EC  81 mg Oral Daily   atorvastatin   80 mg Oral Daily   Chlorhexidine  Gluconate Cloth  6 each Topical Q0600   heparin   3,000 Units Intravenous Once   insulin  aspart  0-15 Units Subcutaneous TID WC   insulin  aspart  0-5 Units Subcutaneous QHS   insulin  aspart  8 Units Subcutaneous TID WC    Continuous Infusions:  ceFEPime  (MAXIPIME ) IV Stopped (10/05/23 2242)   heparin       PRN Medications:  acetaminophen , alteplase , gabapentin , Gerhardt's butt cream, heparin , ondansetron  **OR** ondansetron  (ZOFRAN ) IV  Antimicrobials from admission:  Anti-infectives (From admission, onward)    Start     Dose/Rate Route Frequency Ordered Stop   10/05/23 1021  vancomycin  (VANCOCIN ) IVPB 1000 mg/200 mL premix        1,000 mg 200 mL/hr over 60 Minutes Intravenous 60 min pre-op 10/05/23 1021 10/05/23  1214   10/03/23 1000  levofloxacin  (LEVAQUIN ) IVPB 500 mg  Status:  Discontinued        500 mg 100 mL/hr over 60 Minutes Intravenous Every 48 hours 10/01/23 1445 10/01/23 1458   10/03/23 1000  cefTRIAXone  (ROCEPHIN ) 2 g in sodium chloride  0.9 % 100 mL IVPB  Status:  Discontinued        2 g 200 mL/hr over 30 Minutes Intravenous Every 24 hours 10/01/23 1459 10/02/23 0550   10/02/23 2200  ceFEPIme  (MAXIPIME ) 1 g in sodium chloride  0.9 % 100 mL IVPB       Placed in "Followed by" Linked Group   1 g 200 mL/hr over 30 Minutes Intravenous Every 24 hours 10/02/23 0550     10/02/23 1000  azithromycin  (ZITHROMAX ) 500 mg in sodium chloride  0.9 % 250 mL IVPB  Status:  Discontinued        500 mg 250 mL/hr over 60 Minutes Intravenous Daily 10/01/23 1459 10/02/23 1553   10/02/23 0645  ceFEPIme  (MAXIPIME ) 1 g in sodium chloride  0.9 % 100 mL IVPB       Placed in "Followed by" Linked Group   1 g 200 mL/hr over 30 Minutes Intravenous  Once 10/02/23 0550 10/02/23 0717   10/01/23 0915  vancomycin  (VANCOCIN ) IVPB 1000 mg/200 mL premix        1,000 mg 200 mL/hr over 60 Minutes Intravenous  Once 10/01/23 0913 10/01/23 1058   10/01/23 0915  levofloxacin  (LEVAQUIN ) IVPB 750 mg        750 mg 100 mL/hr over 90 Minutes Intravenous  Once 10/01/23 0913 10/01/23 1807           Data Reviewed:  I have personally reviewed the following...  CBC: Recent Labs  Lab 10/01/23 0912 10/02/23 0600 10/03/23 0222 10/04/23 0441 10/05/23 0556 10/06/23 0511  WBC 12.3* 8.1 5.2 7.1 7.4 10.7*  NEUTROABS 10.7*  --   --   --   --   --   HGB 11.6* 11.2* 10.9* 10.3* 11.1* 10.6*  HCT 35.9* 34.2* 32.4* 30.4* 33.4* 30.9*  MCV 81.2 79.5* 78.8* 76.4* 76.1* 74.8*  PLT 105* 108* 107* 115* 137* 181   Basic Metabolic Panel: Recent Labs  Lab 10/02/23 0600 10/03/23 0222 10/04/23 0441 10/05/23 0556 10/06/23 0511  NA 133* 133* 133* 133* 132*  K 3.5 3.3* 3.3* 4.4 4.1  CL 98 97* 98 98 97*  CO2 24 21* 23 21* 21*  GLUCOSE 147*  148* 155* 318* 414*  BUN 23 32* 40* 52* 60*  CREATININE  5.13* 6.23* 7.42* 8.34* 9.06*  CALCIUM  8.6* 8.3* 8.2* 8.6* 8.7*  MG  --  1.9 2.1  --   --   PHOS  --  4.0 4.3  --   --    GFR: Estimated Creatinine Clearance: 5.5 mL/min (A) (by C-G formula based on SCr of 9.06 mg/dL (H)). Liver Function Tests: Recent Labs  Lab 10/01/23 0912 10/02/23 0600  AST 25 81*  ALT 7 16  ALKPHOS 70 68  BILITOT 0.9 1.0  PROT 6.7 6.3*  ALBUMIN 3.1* 2.9*   No results for input(s): "LIPASE", "AMYLASE" in the last 168 hours. Recent Labs  Lab 10/01/23 2221  AMMONIA 12   Coagulation Profile: Recent Labs  Lab 10/01/23 0912 10/03/23 1018  INR 1.4* 1.2   Cardiac Enzymes: No results for input(s): "CKTOTAL", "CKMB", "CKMBINDEX", "TROPONINI" in the last 168 hours. BNP (last 3 results) No results for input(s): "PROBNP" in the last 8760 hours. HbA1C: No results for input(s): "HGBA1C" in the last 72 hours.  CBG: Recent Labs  Lab 10/05/23 1626 10/05/23 1806 10/05/23 2113 10/06/23 0851 10/06/23 1125  GLUCAP 343* 372* 360* 430* 452*   Lipid Profile: No results for input(s): "CHOL", "HDL", "LDLCALC", "TRIG", "CHOLHDL", "LDLDIRECT" in the last 72 hours.  Thyroid Function Tests: No results for input(s): "TSH", "T4TOTAL", "FREET4", "T3FREE", "THYROIDAB" in the last 72 hours. Anemia Panel: No results for input(s): "VITAMINB12", "FOLATE", "FERRITIN", "TIBC", "IRON", "RETICCTPCT" in the last 72 hours. Most Recent Urinalysis On File:     Component Value Date/Time   COLORURINE Yellow 12/28/2011 1746   APPEARANCEUR Turbid 12/28/2011 1746   LABSPEC 1.010 12/28/2011 1746   PHURINE 8.0 12/28/2011 1746   GLUCOSEU >=500 12/28/2011 1746   HGBUR 2+ 12/28/2011 1746   BILIRUBINUR Negative 12/28/2011 1746   KETONESUR Negative 12/28/2011 1746   PROTEINUR 100 mg/dL 98/04/9146 8295   NITRITE Negative 12/28/2011 1746   LEUKOCYTESUR 3+ 12/28/2011 1746   Sepsis  Labs: @LABRCNTIP (procalcitonin:4,lacticidven:4) Microbiology: Recent Results (from the past 240 hours)  Resp panel by RT-PCR (RSV, Flu A&B, Covid) Anterior Nasal Swab     Status: None   Collection Time: 10/01/23  9:12 AM   Specimen: Anterior Nasal Swab  Result Value Ref Range Status   SARS Coronavirus 2 by RT PCR NEGATIVE NEGATIVE Final    Comment: (NOTE) SARS-CoV-2 target nucleic acids are NOT DETECTED.  The SARS-CoV-2 RNA is generally detectable in upper respiratory specimens during the acute phase of infection. The lowest concentration of SARS-CoV-2 viral copies this assay can detect is 138 copies/mL. A negative result does not preclude SARS-Cov-2 infection and should not be used as the sole basis for treatment or other patient management decisions. A negative result may occur with  improper specimen collection/handling, submission of specimen other than nasopharyngeal swab, presence of viral mutation(s) within the areas targeted by this assay, and inadequate number of viral copies(<138 copies/mL). A negative result must be combined with clinical observations, patient history, and epidemiological information. The expected result is Negative.  Fact Sheet for Patients:  BloggerCourse.com  Fact Sheet for Healthcare Providers:  SeriousBroker.it  This test is no t yet approved or cleared by the [place]  FDA and  has been authorized for detection and/or diagnosis of SARS-CoV-2 by FDA under an Emergency Use Authorization (EUA). This EUA will remain  in effect (meaning this test can be used) for the duration of the COVID-19 declaration under Section 564(b)(1) of the Act, 21 U.S.C.section 360bbb-3(b)(1), unless the authorization is terminated  or revoked sooner.  Influenza A by PCR NEGATIVE NEGATIVE Final   Influenza B by PCR NEGATIVE NEGATIVE Final    Comment: (NOTE) The Xpert Xpress SARS-CoV-2/FLU/RSV plus assay is  intended as an aid in the diagnosis of influenza from Nasopharyngeal swab specimens and should not be used as a sole basis for treatment. Nasal washings and aspirates are unacceptable for Xpert Xpress SARS-CoV-2/FLU/RSV testing.  Fact Sheet for Patients: BloggerCourse.com  Fact Sheet for Healthcare Providers: SeriousBroker.it  This test is not yet approved or cleared by the [place]  FDA and has been authorized for detection and/or diagnosis of SARS-CoV-2 by FDA under an Emergency Use Authorization (EUA). This EUA will remain in effect (meaning this test can be used) for the duration of the COVID-19 declaration under Section 564(b)(1) of the Act, 21 U.S.C. section 360bbb-3(b)(1), unless the authorization is terminated or revoked.     Resp Syncytial Virus by PCR NEGATIVE NEGATIVE Final    Comment: (NOTE) Fact Sheet for Patients: BloggerCourse.com  Fact Sheet for Healthcare Providers: SeriousBroker.it  This test is not yet approved or cleared by the [place]  FDA and has been authorized for detection and/or diagnosis of SARS-CoV-2 by FDA under an Emergency Use Authorization (EUA). This EUA will remain in effect (meaning this test can be used) for the duration of the COVID-19 declaration under Section 564(b)(1) of the Act, 21 U.S.C. section 360bbb-3(b)(1), unless the authorization is terminated or revoked.  Performed at [facility], [address] [place], [place] [address]   Blood Culture (routine x 2)     Status: Abnormal   Collection Time: 10/01/23  9:12 AM   Specimen: BLOOD  Result Value Ref Range Status   Specimen Description   Final    BLOOD BLOOD LEFT FOREARM Performed at [facility], [address] [place], [place] [address]    Special Requests   Final    BOTTLES DRAWN AEROBIC AND ANAEROBIC Blood Culture results may not be optimal due  to an inadequate volume of blood received in culture bottles Performed at [facility], [address], [place], [place] [address]    Culture  Setup Time   Final    GRAM NEGATIVE RODS IN BOTH AEROBIC AND ANAEROBIC BOTTLES CRITICAL VALUE NOTED.  VALUE IS CONSISTENT WITH PREVIOUSLY REPORTED AND CALLED VALUE. Performed at [facility], [address] [place], [place] [address]    Culture (A)  Final    SERRATIA MARCESCENS SUSCEPTIBILITIES PERFORMED ON PREVIOUS CULTURE WITHIN THE LAST 5 DAYS. Performed at [facility], [address], [place], [place] [address]    Report Status 10/04/2023 FINAL  Final  Respiratory (~20 pathogens) panel by PCR     Status: None   Collection Time: 10/01/23  9:12 AM   Specimen: Nasopharyngeal Swab; Respiratory  Result Value Ref Range Status   Adenovirus NOT DETECTED NOT DETECTED Final   Coronavirus 229E NOT DETECTED NOT DETECTED Final    Comment: (NOTE) The Coronavirus on the Respiratory Panel, DOES NOT test for the novel  Coronavirus (2019 nCoV)    Coronavirus HKU1 NOT DETECTED NOT DETECTED Final   Coronavirus NL63 NOT DETECTED NOT DETECTED Final   Coronavirus OC43 NOT DETECTED NOT DETECTED Final   Metapneumovirus NOT DETECTED NOT DETECTED Final   Rhinovirus / Enterovirus NOT DETECTED NOT DETECTED Final   Influenza A NOT DETECTED NOT DETECTED Final   Influenza B NOT DETECTED NOT DETECTED Final   Parainfluenza Virus 1 NOT DETECTED NOT DETECTED Final   Parainfluenza Virus 2  NOT DETECTED NOT DETECTED Final   Parainfluenza Virus 3 NOT DETECTED NOT DETECTED Final   Parainfluenza Virus 4 NOT DETECTED NOT DETECTED Final   Respiratory Syncytial Virus NOT DETECTED NOT DETECTED Final   Bordetella pertussis NOT DETECTED NOT DETECTED Final   Bordetella Parapertussis NOT DETECTED NOT DETECTED Final   Chlamydophila pneumoniae NOT DETECTED NOT DETECTED Final   Mycoplasma pneumoniae NOT DETECTED NOT DETECTED Final    Comment: Performed at  [facility], [address], [place], [place] [address]  Blood Culture (routine x 2)     Status: Abnormal   Collection Time: 10/01/23  9:33 AM   Specimen: BLOOD  Result Value Ref Range Status   Specimen Description   Final    BLOOD RIGHT WRIST Performed at [facility], [address] [place], [place] [address]    Special Requests   Final    BOTTLES DRAWN AEROBIC AND ANAEROBIC Blood Culture adequate volume Performed at [facility], [address], [place], [place] [address]    Culture  Setup Time   Final    GRAM NEGATIVE RODS IN BOTH AEROBIC AND ANAEROBIC BOTTLES CRITICAL RESULT CALLED TO, READ BACK BY AND VERIFIED WITH:  NATHAN BELUE AT 0457 10/02/23 JG Performed at [facility], [address] [place], [place] [address]    Culture SERRATIA MARCESCENS (A)  Final   Report Status 10/04/2023 FINAL  Final   Organism ID, Bacteria SERRATIA MARCESCENS  Final      Susceptibility   Serratia marcescens - MIC*    CEFEPIME  <=0.12 SENSITIVE Sensitive     CEFTAZIDIME <=1 SENSITIVE Sensitive     CEFTRIAXONE  <=0.25 SENSITIVE Sensitive     CIPROFLOXACIN  <=0.25 SENSITIVE Sensitive     GENTAMICIN <=1 SENSITIVE Sensitive     TRIMETH /SULFA  <=20 SENSITIVE Sensitive     * SERRATIA MARCESCENS  Blood Culture ID Panel (Reflexed)     Status: Abnormal   Collection Time: 10/01/23  9:33 AM  Result Value Ref Range Status   Enterococcus faecalis NOT DETECTED NOT DETECTED Final   Enterococcus Faecium NOT DETECTED NOT DETECTED Final   Listeria monocytogenes NOT DETECTED NOT DETECTED Final   Staphylococcus species NOT DETECTED NOT DETECTED Final   Staphylococcus aureus (BCID) NOT DETECTED NOT DETECTED Final   Staphylococcus epidermidis NOT DETECTED NOT DETECTED Final   Staphylococcus lugdunensis NOT DETECTED NOT DETECTED Final   Streptococcus species NOT DETECTED NOT DETECTED Final   Streptococcus agalactiae NOT DETECTED NOT DETECTED Final   Streptococcus pneumoniae NOT  DETECTED NOT DETECTED Final   Streptococcus pyogenes NOT DETECTED NOT DETECTED Final   A.calcoaceticus-baumannii NOT DETECTED NOT DETECTED Final   Bacteroides fragilis NOT DETECTED NOT DETECTED Final   Enterobacterales DETECTED (A) NOT DETECTED Final    Comment: Enterobacterales represent a large order of gram negative bacteria, not a single organism. CRITICAL RESULT CALLED TO, READ BACK BY AND VERIFIED WITH:  NATHAN BELUE AT 1308 10/02/23 JG    Enterobacter cloacae complex NOT DETECTED NOT DETECTED Final   Escherichia coli NOT DETECTED NOT DETECTED Final   Klebsiella aerogenes NOT DETECTED NOT DETECTED Final   Klebsiella oxytoca NOT DETECTED NOT DETECTED Final   Klebsiella pneumoniae NOT DETECTED NOT DETECTED Final   Proteus species NOT DETECTED NOT DETECTED Final   Salmonella species NOT DETECTED NOT DETECTED Final   Serratia marcescens DETECTED (A) NOT DETECTED Final    Comment: CRITICAL RESULT CALLED TO, READ BACK BY AND VERIFIED WITH:  NATHAN BELUE AT 0457 10/02/23  JG    Haemophilus influenzae NOT DETECTED NOT DETECTED Final   Neisseria meningitidis NOT DETECTED NOT DETECTED Final   Pseudomonas aeruginosa NOT DETECTED NOT DETECTED Final   Stenotrophomonas maltophilia NOT DETECTED NOT DETECTED Final   Candida albicans NOT DETECTED NOT DETECTED Final   Candida auris NOT DETECTED NOT DETECTED Final   Candida glabrata NOT DETECTED NOT DETECTED Final   Candida krusei NOT DETECTED NOT DETECTED Final   Candida parapsilosis NOT DETECTED NOT DETECTED Final   Candida tropicalis NOT DETECTED NOT DETECTED Final   Cryptococcus neoformans/gattii NOT DETECTED NOT DETECTED Final   CTX-M ESBL NOT DETECTED NOT DETECTED Final   Carbapenem resistance IMP NOT DETECTED NOT DETECTED Final   Carbapenem resistance KPC NOT DETECTED NOT DETECTED Final   Carbapenem resistance NDM NOT DETECTED NOT DETECTED Final   Carbapenem resist OXA 48 LIKE NOT DETECTED NOT DETECTED Final   Carbapenem resistance VIM NOT  DETECTED NOT DETECTED Final    Comment: Performed at [facility], [address] [place], [place] [address]  MRSA Next Gen by PCR, Nasal     Status: None   Collection Time: 10/02/23  5:26 PM   Specimen: Nasal Mucosa; Nasal Swab  Result Value Ref Range Status   MRSA by PCR Next Gen NOT DETECTED NOT DETECTED Final    Comment: (NOTE) The GeneXpert MRSA Assay (FDA approved for NASAL specimens only), is one component of a comprehensive MRSA colonization surveillance program. It is not intended to diagnose MRSA infection nor to guide or monitor treatment for MRSA infections. Test performance is not FDA approved in patients less than 18 years old. Performed at [facility], [address] [place], [place] [address]   Cath Tip Culture     Status: Abnormal (Preliminary result)   Collection Time: 10/02/23  9:28 PM   Specimen: Catheter Tip; Other  Result Value Ref Range Status   Specimen Description   Final    CATH TIP Performed at [facility], [address] [place], [place] [address]    Special Requests   Final    NONE Performed at [facility], [address], [place], [place] [address]    Culture (A)  Final    SERRATIA MARCESCENS SUSCEPTIBILITIES TO FOLLOW Performed at [facility], [address], [place], [place] [address]    Report Status PENDING  Incomplete  Culture, blood (Routine X 2) w Reflex to ID Panel     Status: None (Preliminary result)   Collection Time: 10/03/23  1:00 PM   Specimen: BLOOD  Result Value Ref Range Status   Specimen Description BLOOD BLOOD LEFT WRIST  Final   Special Requests   Final    BOTTLES DRAWN AEROBIC ONLY Blood Culture adequate volume   Culture   Final    NO GROWTH 3 DAYS Performed at [facility], [address] [place], [place] [address]    Report Status PENDING  Incomplete  Culture, blood (Routine X 2) w Reflex to ID Panel     Status: None (Preliminary result)   Collection  Time: 10/03/23  1:04 PM   Specimen: BLOOD  Result Value Ref Range Status   Specimen Description BLOOD BLOOD LEFT FOREARM  Final   Special Requests   Final    BOTTLES DRAWN AEROBIC ONLY Blood Culture adequate volume   Culture   Final    NO GROWTH 3 DAYS Performed at [facility], [address] [place], [place] [address]  Report Status PENDING  Incomplete      Radiology Studies last 3 days: PERIPHERAL VASCULAR CATHETERIZATION Result Date: 10/05/2023 See surgical note for result.  CARDIAC CATHETERIZATION Result Date: 10/04/2023 Images from the original result were not included.   Prox RCA to Mid RCA lesion is 90% stenosed.  Mid RCA lesion is 80% stenosed.Mid RCA lesion is 80% stenosed.   Non-stenotic Ost LM to Dist LM lesion.   Heavily calcified left coronary artery system was not able to be engaged due to inability to advance catheter to the left coronary cusp.   Not able to cross the aortic valve for measurement of the hemodynamics. Dominance: Right Heavily calcified somewhat tortuous RCA was difficult to engage: Prox RCA to Mid RCA lesion is 90% stenosed. Mid RCA lesion is 80% stenosed.Mid RCA lesion is 80% stenosed. Heavily calcified left coronary artery system was not able to be engaged due to inability to advance catheter to the left coronary cusp. Unable to engage the left coronary artery as the catheters were not advancing beyond the innominate artery and aortic knob. There is extreme tortuosity of the innominate artery and heavy calcifications not allowing the catheter be pushed freely through the artery and into the forearm or brachial aspect of the radial artery and worse around the tortuous innominate. RECOMMENDATIONS   Anticipated discharge date to be determined.   Recommend Aspirin  81mg  daily for moderate CAD.   Based on the presence of severe coronary disease at least in the one-vessel seen and heavy calcification in the left coronary system would strongly consider  antiplatelet therapy with at a minimum aspirin  but even potentially Plavix based on elevated troponin levels.  No obvious invasive options for CAD management given lack of viable access. Randene Bustard, MD       Time spent: 50 min     Melodi Sprung, DO [facility] 10/06/2023, 4:54 PM    Dictation software may have been used to generate the above note. Typos may occur and escape review in typed/dictated notes. Please contact Dr Authur Leghorn directly for clarity if needed.  Staff may message me via secure chat in Epic  but this may not receive an immediate response,  please page me for urgent matters!  If 7PM-7AM, please contact night coverage [email or web address]

## 2023-10-06 NOTE — Progress Notes (Signed)
 Cardiology Progress Note   Patient Name: Amy Wu Date of Encounter: 10/06/2023  Primary Cardiologist: Timothy Gollan, MD  Subjective   Feels well this morning.  Hopes to go home.  Pending dialysis. Objective   Inpatient Medications    Scheduled Meds:  aspirin  EC  81 mg Oral Daily   atorvastatin   80 mg Oral Daily   Chlorhexidine  Gluconate Cloth  6 each Topical Q0600   heparin  injection (subcutaneous)  5,000 Units Subcutaneous Q8H   insulin  aspart  0-15 Units Subcutaneous TID WC   insulin  aspart  0-5 Units Subcutaneous QHS   insulin  aspart  8 Units Subcutaneous TID WC   Continuous Infusions:  ceFEPime  (MAXIPIME ) IV Stopped (10/05/23 2242)   PRN Meds: acetaminophen , alteplase , gabapentin , Gerhardt's butt cream, heparin , ondansetron  **OR** ondansetron  (ZOFRAN ) IV   Vital Signs    Vitals:   10/06/23 0630 10/06/23 0853 10/06/23 1256 10/06/23 1314  BP:  (!) 188/84 110/68 136/66  Pulse:  65 (!) 130 65  Resp:  18 19 17   Temp:  (!) 97.3 F (36.3 C) (!) 97.4 F (36.3 C)   TempSrc:   Oral   SpO2:  100% 100% 100%  Weight: (P) 98.2 kg  97.4 kg     Intake/Output Summary (Last 24 hours) at 10/06/2023 1333 Last data filed at 10/06/2023 0624 Gross per 24 hour  Intake 100 ml  Output --  Net 100 ml   Filed Weights   10/05/23 0500 10/06/23 0630 10/06/23 1256  Weight: 96.4 kg (P) 98.2 kg 97.4 kg    Physical Exam   GEN: Obese, in no acute distress.  HEENT: Grossly normal.  Neck: Supple, obese, difficult to gauge JVP.   Cardiac: Irregular, no murmurs, rubs, or gallops. No clubbing, cyanosis, edema.  Right radial cath site without bleeding, bruit, or hematoma.  Bilateral AKAs. Respiratory:  Respirations regular and unlabored, clear to auscultation bilaterally. GI: Obese, soft, nontender, nondistended, BS + x 4. MS: no deformity or atrophy. Skin: warm and dry, no rash. Neuro:  Strength and sensation are intact. Psych: AAOx3.  Normal affect.  Labs     Chemistry Recent Labs  Lab 10/01/23 0912 10/02/23 0600 10/03/23 0222 10/04/23 0441 10/05/23 0556 10/06/23 0511  NA 138 133*   < > 133* 133* 132*  K 3.0* 3.5   < > 3.3* 4.4 4.1  CL 101 98   < > 98 98 97*  CO2 23 24   < > 23 21* 21*  GLUCOSE 180* 147*   < > 155* 318* 414*  BUN 31* 23   < > 40* 52* 60*  CREATININE 7.42* 5.13*   < > 7.42* 8.34* 9.06*  CALCIUM  8.6* 8.6*   < > 8.2* 8.6* 8.7*  PROT 6.7 6.3*  --   --   --   --   ALBUMIN 3.1* 2.9*  --   --   --   --   AST 25 81*  --   --   --   --   ALT 7 16  --   --   --   --   ALKPHOS 70 68  --   --   --   --   BILITOT 0.9 1.0  --   --   --   --   GFRNONAA 5* 8*   < > 5* 5* 4*  ANIONGAP 14 11   < > 12 14 14    < > = values in this interval not displayed.  Hematology Recent Labs  Lab 10/04/23 0441 10/05/23 0556 10/06/23 0511  WBC 7.1 7.4 10.7*  RBC 3.98 4.39 4.13  HGB 10.3* 11.1* 10.6*  HCT 30.4* 33.4* 30.9*  MCV 76.4* 76.1* 74.8*  MCH 25.9* 25.3* 25.7*  MCHC 33.9 33.2 34.3  RDW 17.4* 17.5* 17.0*  PLT 115* 137* 181    Cardiac Enzymes  Recent Labs  Lab 10/03/23 0222 10/03/23 0421 10/03/23 0935  TROPONINIHS 6,721* 6,948* 5,122*      BNP    Component Value Date/Time   BNP 1,436.1 (H) 10/01/2023 0912    Lipids  Lab Results  Component Value Date   CHOL 90 10/03/2023   HDL 16 (L) 10/03/2023   LDLCALC 44 10/03/2023   TRIG 148 10/03/2023   CHOLHDL 5.6 10/03/2023    HbA1c  Lab Results  Component Value Date   HGBA1C 6.5 (H) 10/01/2023    Radiology    ---------   Telemetry    Appears to have converted to atrial flutter between 9 AM and 10 AM this morning.  Twelve-lead ECG pending- Personally Reviewed  Cardiac Studies   2D Echocardiogram 4.14.2025  1. Left ventricular ejection fraction, by estimation, is >55%. The left  ventricle has normal function. Left ventricular endocardial border not  optimally defined to evaluate regional wall motion. There is mild left  ventricular hypertrophy. Left   ventricular diastolic parameters are indeterminate.   2. Right ventricular systolic function is low normal. The right  ventricular size is normal.   3. The mitral valve is normal in structure. No evidence of mitral valve  regurgitation. No evidence of mitral stenosis.   4. The aortic valve is tricuspid. There is moderate thickening of the  aortic valve. Aortic valve regurgitation is not visualized. Aortic valve  sclerosis is present, with no evidence of aortic valve stenosis.  _____________   Cardiac Catheterization  4.17.2025  Dominance: Right    Heavily calcified somewhat tortuous RCA was difficult to engage: Prox RCA to Mid RCA lesion is 90% stenosed. Mid RCA lesion is 80% stenosed.Mid RCA lesion is 80% stenosed.  Heavily calcified left coronary artery system was not able to be engaged due to inability to advance catheter to the left coronary cusp. Unable to engage the left coronary artery as the catheters were not advancing beyond the innominate artery and aortic knob. There is extreme tortuosity of the innominate artery and heavy calcifications not allowing the catheter be pushed freely through the artery and into the forearm or brachial aspect of the radial artery and worse around the tortuous innominate.    RECOMMENDATIONS   Anticipated discharge date to be determined.   Recommend Aspirin  81mg  daily for moderate CAD.   Based on the presence of severe coronary disease at least in the one-vessel seen and heavy calcification in the left coronary system would strongly consider antiplatelet therapy with at a minimum aspirin  but even potentially Plavix based on elevated troponin levels.   No obvious invasive options for CAD management given lack of viable access.   _____________   Patient Profile   73 y.o. female with history of end-stage renal disease on hemodialysis, diabetes, hyperlipidemia, PAD status post bilateral AKA, and obesity, who was admitted April 15 with sepsis,  bacteremia, hypotension, and non-STEMI with peak troponin of 6948.   Assessment & Plan    1.  Non-STEMI/CAD:  Patient admitted April 15 with bacteremia, sepsis, fever, hypoxia, tachycardia, and hypotension initially requiring vasopressor therapy. At time she had bradycardia in the 30s to 33s  following a dose of Zanaflex . Troponin peaked at 6948. Echo shows normal LV function.  Diagnostic catheterization on April 17 complicated by difficult vascular anatomy and inability to engage the left coronary tree with catheters.  The right coronary artery has severe mid and distal disease.  On fluoroscopy, the left coronary tree was significantly calcified, though the interventional team was unable to engage and therefore unable to obtain imaging of flow.  Conservative medical therapy has been recommended with the presumption that she has significant multivessel disease.  She has not been having any chest pain.  Will continue aspirin  and statin therapy.  Beta-blocker previously avoided in the setting of hypotension requiring midodrine  earlier during hospitalization.  Pressures have since been relatively stable.  2.  Atrial flutter: Telemetry reviewed this morning reveals that between 9 and 10 AM, patient appears to have converted to atrial flutter.  Twelve-lead ECG ordered stat to confirm.  Fortunately, she is well rate controlled and asymptomatic at this time though in the long run, may not do well in atrial flutter.  As this was found on telemetry this morning, if confirmed by ECG, would strongly consider initiation of amiodarone  for rhythm management.  Provided that blood pressure stable, low-dose beta-blocker reasonable.  CHA2DS2-VASc equals at least 4.  Would recommend Eliquis  therapy.    3.  Septic shock/Serratia bacteremia: Felt to be secondary to HD catheter which has since been removed with temporary catheter replaced.  Hemodynamically stable and no longer requiring midodrine .  Antibiotics per infectious  disease.  4.  End-stage renal disease: Hemodialysis managed by nephrology.  Signed, [name], NP  10/06/2023, 1:33 PM    For questions or updates, please contact   Please consult [email or web address] for contact info under Cardiology/STEMI.

## 2023-10-06 NOTE — Progress Notes (Signed)
 Hemodialysis note  Received patient in bed to unit. Alert and oriented.  Informed consent signed and in chart.  Tx duration: 3 hours  Patient tolerated well. Transported back to room, alert without acute distress.  Report given to patient's RN.   Access used: Left femoral HD Catheter Access issues: none  Total UF removed: 2L Medication(s) given: none   Post HD weight: 96.4 kg   Amy Wu [facility]

## 2023-10-06 NOTE — Progress Notes (Signed)
 [facility]  ROUNDING NOTE   Subjective:   Amy Wu  is a 73 y.o.  female with past medical history of hypertension, diabetes, PVD, dyslipidemia, and end-stage renal disease on dialysis. She presents to the ED via EMS for evaluation of altered mental status and fever and has been admitted under observation for Acute respiratory failure with hypoxia (HCC) [J96.01] Sepsis (HCC) [A41.9] Sepsis, due to unspecified organism, unspecified whether acute organ dysfunction present [facility]) [A41.9]  Patient is known to our practice and receives outpatient dialysis treatments at [facility] on a MWF schedule, supervised by [facility] physicians.   Patient sitting up in bed Alert and oriented Hopeful for discharge later today Remains on 2L    Objective:  Vital signs in last 24 hours:  Temp:  [97.3 F (36.3 C)-97.7 F (36.5 C)] 97.3 F (36.3 C) (04/19 0853) Pulse Rate:  [62-74] 65 (04/19 0853) Resp:  [11-26] 18 (04/19 0853) BP: (95-188)/(50-164) 188/84 (04/19 0853) SpO2:  [95 %-100 %] 100 % (04/19 0853) FiO2 (%):  [30 %] 30 % (04/18 2345) Weight:  [98.2 kg] (P) 98.2 kg (04/19 0630)  Weight change:  Filed Weights   10/04/23 0500 10/05/23 0500 10/06/23 0630  Weight: 99.9 kg 96.4 kg (P) 98.2 kg    Intake/Output: I/O last 3 completed shifts: In: 385.4 [I.V.:185.4; IV Piggyback:200] Out: -    Intake/Output this shift:  No intake/output data recorded.  Physical Exam: General: NAD  Head: Normocephalic, atraumatic.   Eyes: Anicteric  Lungs:  Diminished, [place] O2  Heart: Regular rate and rhythm  Abdomen:  Soft, nontender  Extremities:  Trace Rt stump peripheral edema. Bilat AKA  Neurologic: Alert, moving all four extremities  Skin: No lesions  Access: Rt thigh permcath (removed on 4/15)    Basic Metabolic Panel: Recent Labs  Lab 10/02/23 0600 10/03/23 0222 10/04/23 0441 10/05/23 0556 10/06/23 0511  NA 133* 133* 133* 133* 132*  K 3.5 3.3* 3.3* 4.4 4.1  CL 98  97* 98 98 97*  CO2 24 21* 23 21* 21*  GLUCOSE 147* 148* 155* 318* 414*  BUN 23 32* 40* 52* 60*  CREATININE 5.13* 6.23* 7.42* 8.34* 9.06*  CALCIUM  8.6* 8.3* 8.2* 8.6* 8.7*  MG  --  1.9 2.1  --   --   PHOS  --  4.0 4.3  --   --     Liver Function Tests: Recent Labs  Lab 10/01/23 0912 10/02/23 0600  AST 25 81*  ALT 7 16  ALKPHOS 70 68  BILITOT 0.9 1.0  PROT 6.7 6.3*  ALBUMIN 3.1* 2.9*   No results for input(s): "LIPASE", "AMYLASE" in the last 168 hours. Recent Labs  Lab 10/01/23 2221  AMMONIA 12    CBC: Recent Labs  Lab 10/01/23 0912 10/02/23 0600 10/03/23 0222 10/04/23 0441 10/05/23 0556 10/06/23 0511  WBC 12.3* 8.1 5.2 7.1 7.4 10.7*  NEUTROABS 10.7*  --   --   --   --   --   HGB 11.6* 11.2* 10.9* 10.3* 11.1* 10.6*  HCT 35.9* 34.2* 32.4* 30.4* 33.4* 30.9*  MCV 81.2 79.5* 78.8* 76.4* 76.1* 74.8*  PLT 105* 108* 107* 115* 137* 181    Cardiac Enzymes: No results for input(s): "CKTOTAL", "CKMB", "CKMBINDEX", "TROPONINI" in the last 168 hours.  BNP: Invalid input(s): "POCBNP"  CBG: Recent Labs  Lab 10/05/23 1626 10/05/23 1806 10/05/23 2113 10/06/23 0851 10/06/23 1125  GLUCAP 343* 372* 360* 430* 452*    Microbiology: Results for orders placed or  performed during the hospital encounter of 10/01/23  Resp panel by RT-PCR (RSV, Flu A&B, Covid) Anterior Nasal Swab     Status: None   Collection Time: 10/01/23  9:12 AM   Specimen: Anterior Nasal Swab  Result Value Ref Range Status   SARS Coronavirus 2 by RT PCR NEGATIVE NEGATIVE Final    Comment: (NOTE) SARS-CoV-2 target nucleic acids are NOT DETECTED.  The SARS-CoV-2 RNA is generally detectable in upper respiratory specimens during the acute phase of infection. The lowest concentration of SARS-CoV-2 viral copies this assay can detect is 138 copies/mL. A negative result does not preclude SARS-Cov-2 infection and should not be used as the sole basis for treatment or other patient management decisions. A  negative result may occur with  improper specimen collection/handling, submission of specimen other than nasopharyngeal swab, presence of viral mutation(s) within the areas targeted by this assay, and inadequate number of viral copies(<138 copies/mL). A negative result must be combined with clinical observations, patient history, and epidemiological information. The expected result is Negative.  Fact Sheet for Patients:  BloggerCourse.com  Fact Sheet for Healthcare Providers:  SeriousBroker.it  This test is no t yet approved or cleared by the [place]  FDA and  has been authorized for detection and/or diagnosis of SARS-CoV-2 by FDA under an Emergency Use Authorization (EUA). This EUA will remain  in effect (meaning this test can be used) for the duration of the COVID-19 declaration under Section 564(b)(1) of the Act, 21 U.S.C.section 360bbb-3(b)(1), unless the authorization is terminated  or revoked sooner.       Influenza A by PCR NEGATIVE NEGATIVE Final   Influenza B by PCR NEGATIVE NEGATIVE Final    Comment: (NOTE) The Xpert Xpress SARS-CoV-2/FLU/RSV plus assay is intended as an aid in the diagnosis of influenza from Nasopharyngeal swab specimens and should not be used as a sole basis for treatment. Nasal washings and aspirates are unacceptable for Xpert Xpress SARS-CoV-2/FLU/RSV testing.  Fact Sheet for Patients: BloggerCourse.com  Fact Sheet for Healthcare Providers: SeriousBroker.it  This test is not yet approved or cleared by the [place]  FDA and has been authorized for detection and/or diagnosis of SARS-CoV-2 by FDA under an Emergency Use Authorization (EUA). This EUA will remain in effect (meaning this test can be used) for the duration of the COVID-19 declaration under Section 564(b)(1) of the Act, 21 U.S.C. section 360bbb-3(b)(1), unless the authorization  is terminated or revoked.     Resp Syncytial Virus by PCR NEGATIVE NEGATIVE Final    Comment: (NOTE) Fact Sheet for Patients: BloggerCourse.com  Fact Sheet for Healthcare Providers: SeriousBroker.it  This test is not yet approved or cleared by the [place]  FDA and has been authorized for detection and/or diagnosis of SARS-CoV-2 by FDA under an Emergency Use Authorization (EUA). This EUA will remain in effect (meaning this test can be used) for the duration of the COVID-19 declaration under Section 564(b)(1) of the Act, 21 U.S.C. section 360bbb-3(b)(1), unless the authorization is terminated or revoked.  Performed at [facility], [address] [place], [place] [address]   Blood Culture (routine x 2)     Status: Abnormal   Collection Time: 10/01/23  9:12 AM   Specimen: BLOOD  Result Value Ref Range Status   Specimen Description   Final    BLOOD BLOOD LEFT FOREARM Performed at [facility], [address], [place], [place] [address]    Special Requests   Final    BOTTLES DRAWN AEROBIC AND ANAEROBIC Blood  Culture results may not be optimal due to an inadequate volume of blood received in culture bottles Performed at [facility], [address] [place], [place] [address]    Culture  Setup Time   Final    GRAM NEGATIVE RODS IN BOTH AEROBIC AND ANAEROBIC BOTTLES CRITICAL VALUE NOTED.  VALUE IS CONSISTENT WITH PREVIOUSLY REPORTED AND CALLED VALUE. Performed at [facility], [address] [place], [place] [address]    Culture (A)  Final    SERRATIA MARCESCENS SUSCEPTIBILITIES PERFORMED ON PREVIOUS CULTURE WITHIN THE LAST 5 DAYS. Performed at [facility], [address] [place], [place] [address]    Report Status 10/04/2023 FINAL  Final  Respiratory (~20 pathogens) panel by PCR     Status: None   Collection Time: 10/01/23  9:12 AM   Specimen: Nasopharyngeal Swab;  Respiratory  Result Value Ref Range Status   Adenovirus NOT DETECTED NOT DETECTED Final   Coronavirus 229E NOT DETECTED NOT DETECTED Final    Comment: (NOTE) The Coronavirus on the Respiratory Panel, DOES NOT test for the novel  Coronavirus (2019 nCoV)    Coronavirus HKU1 NOT DETECTED NOT DETECTED Final   Coronavirus NL63 NOT DETECTED NOT DETECTED Final   Coronavirus OC43 NOT DETECTED NOT DETECTED Final   Metapneumovirus NOT DETECTED NOT DETECTED Final   Rhinovirus / Enterovirus NOT DETECTED NOT DETECTED Final   Influenza A NOT DETECTED NOT DETECTED Final   Influenza B NOT DETECTED NOT DETECTED Final   Parainfluenza Virus 1 NOT DETECTED NOT DETECTED Final   Parainfluenza Virus 2 NOT DETECTED NOT DETECTED Final   Parainfluenza Virus 3 NOT DETECTED NOT DETECTED Final   Parainfluenza Virus 4 NOT DETECTED NOT DETECTED Final   Respiratory Syncytial Virus NOT DETECTED NOT DETECTED Final   Bordetella pertussis NOT DETECTED NOT DETECTED Final   Bordetella Parapertussis NOT DETECTED NOT DETECTED Final   Chlamydophila pneumoniae NOT DETECTED NOT DETECTED Final   Mycoplasma pneumoniae NOT DETECTED NOT DETECTED Final    Comment: Performed at [facility], [address] [place], [place] [address]  Blood Culture (routine x 2)     Status: Abnormal   Collection Time: 10/01/23  9:33 AM   Specimen: BLOOD  Result Value Ref Range Status   Specimen Description   Final    BLOOD RIGHT WRIST Performed at [facility], [address], [place], [place] [address]    Special Requests   Final    BOTTLES DRAWN AEROBIC AND ANAEROBIC Blood Culture adequate volume Performed at [facility], [address], [place], [place] [address]    Culture  Setup Time   Final    GRAM NEGATIVE RODS IN BOTH AEROBIC AND ANAEROBIC BOTTLES CRITICAL RESULT CALLED TO, READ BACK BY AND VERIFIED WITH:  NATHAN BELUE AT 0457 10/02/23 JG Performed at [facility], [address], [place],  [place] [address]    Culture SERRATIA MARCESCENS (A)  Final   Report Status 10/04/2023 FINAL  Final   Organism ID, Bacteria SERRATIA MARCESCENS  Final      Susceptibility   Serratia marcescens - MIC*    CEFEPIME  <=0.12 SENSITIVE Sensitive     CEFTAZIDIME <=1 SENSITIVE Sensitive     CEFTRIAXONE  <=0.25 SENSITIVE Sensitive     CIPROFLOXACIN  <=0.25 SENSITIVE Sensitive     GENTAMICIN <=1 SENSITIVE Sensitive     TRIMETH /SULFA  <=20 SENSITIVE Sensitive     * SERRATIA MARCESCENS  Blood Culture ID Panel (Reflexed)  Status: Abnormal   Collection Time: 10/01/23  9:33 AM  Result Value Ref Range Status   Enterococcus faecalis NOT DETECTED NOT DETECTED Final   Enterococcus Faecium NOT DETECTED NOT DETECTED Final   Listeria monocytogenes NOT DETECTED NOT DETECTED Final   Staphylococcus species NOT DETECTED NOT DETECTED Final   Staphylococcus aureus (BCID) NOT DETECTED NOT DETECTED Final   Staphylococcus epidermidis NOT DETECTED NOT DETECTED Final   Staphylococcus lugdunensis NOT DETECTED NOT DETECTED Final   Streptococcus species NOT DETECTED NOT DETECTED Final   Streptococcus agalactiae NOT DETECTED NOT DETECTED Final   Streptococcus pneumoniae NOT DETECTED NOT DETECTED Final   Streptococcus pyogenes NOT DETECTED NOT DETECTED Final   A.calcoaceticus-baumannii NOT DETECTED NOT DETECTED Final   Bacteroides fragilis NOT DETECTED NOT DETECTED Final   Enterobacterales DETECTED (A) NOT DETECTED Final    Comment: Enterobacterales represent a large order of gram negative bacteria, not a single organism. CRITICAL RESULT CALLED TO, READ BACK BY AND VERIFIED WITH:  NATHAN BELUE AT 0457 10/02/23 JG    Enterobacter cloacae complex NOT DETECTED NOT DETECTED Final   Escherichia coli NOT DETECTED NOT DETECTED Final   Klebsiella aerogenes NOT DETECTED NOT DETECTED Final   Klebsiella oxytoca NOT DETECTED NOT DETECTED Final   Klebsiella pneumoniae NOT DETECTED NOT DETECTED Final   Proteus species NOT DETECTED NOT  DETECTED Final   Salmonella species NOT DETECTED NOT DETECTED Final   Serratia marcescens DETECTED (A) NOT DETECTED Final    Comment: CRITICAL RESULT CALLED TO, READ BACK BY AND VERIFIED WITH:  NATHAN BELUE AT 0457 10/02/23 JG    Haemophilus influenzae NOT DETECTED NOT DETECTED Final   Neisseria meningitidis NOT DETECTED NOT DETECTED Final   Pseudomonas aeruginosa NOT DETECTED NOT DETECTED Final   Stenotrophomonas maltophilia NOT DETECTED NOT DETECTED Final   Candida albicans NOT DETECTED NOT DETECTED Final   Candida auris NOT DETECTED NOT DETECTED Final   Candida glabrata NOT DETECTED NOT DETECTED Final   Candida krusei NOT DETECTED NOT DETECTED Final   Candida parapsilosis NOT DETECTED NOT DETECTED Final   Candida tropicalis NOT DETECTED NOT DETECTED Final   Cryptococcus neoformans/gattii NOT DETECTED NOT DETECTED Final   CTX-M ESBL NOT DETECTED NOT DETECTED Final   Carbapenem resistance IMP NOT DETECTED NOT DETECTED Final   Carbapenem resistance KPC NOT DETECTED NOT DETECTED Final   Carbapenem resistance NDM NOT DETECTED NOT DETECTED Final   Carbapenem resist OXA 48 LIKE NOT DETECTED NOT DETECTED Final   Carbapenem resistance VIM NOT DETECTED NOT DETECTED Final    Comment: Performed at [facility], [address] [place], [place] [address]  MRSA Next Gen by PCR, Nasal     Status: None   Collection Time: 10/02/23  5:26 PM   Specimen: Nasal Mucosa; Nasal Swab  Result Value Ref Range Status   MRSA by PCR Next Gen NOT DETECTED NOT DETECTED Final    Comment: (NOTE) The GeneXpert MRSA Assay (FDA approved for NASAL specimens only), is one component of a comprehensive MRSA colonization surveillance program. It is not intended to diagnose MRSA infection nor to guide or monitor treatment for MRSA infections. Test performance is not FDA approved in patients less than 63 years old. Performed at [facility], [address], [place], [place] [address]   Cath Tip  Culture     Status: Abnormal (Preliminary result)   Collection Time: 10/02/23  9:28 PM   Specimen: Catheter Tip; Other  Result Value Ref Range Status   Specimen Description  Final    CATH TIP Performed at [facility], [address] [place], [place] [address]    Special Requests   Final    NONE Performed at [facility], [address] [place], [place] [address]    Culture (A)  Final    SERRATIA MARCESCENS SUSCEPTIBILITIES TO FOLLOW Performed at [facility], [address], [place], [place] [address]    Report Status PENDING  Incomplete  Culture, blood (Routine X 2) w Reflex to ID Panel     Status: None (Preliminary result)   Collection Time: 10/03/23  1:00 PM   Specimen: BLOOD  Result Value Ref Range Status   Specimen Description BLOOD BLOOD LEFT WRIST  Final   Special Requests   Final    BOTTLES DRAWN AEROBIC ONLY Blood Culture adequate volume   Culture   Final    NO GROWTH 3 DAYS Performed at [facility], [address], [place], [place] [address]    Report Status PENDING  Incomplete  Culture, blood (Routine X 2) w Reflex to ID Panel     Status: None (Preliminary result)   Collection Time: 10/03/23  1:04 PM   Specimen: BLOOD  Result Value Ref Range Status   Specimen Description BLOOD BLOOD LEFT FOREARM  Final   Special Requests   Final    BOTTLES DRAWN AEROBIC ONLY Blood Culture adequate volume   Culture   Final    NO GROWTH 3 DAYS Performed at [facility], [address], [place], [place] [address]    Report Status PENDING  Incomplete    Coagulation Studies: No results for input(s): "LABPROT", "INR" in the last 72 hours.   Urinalysis: No results for input(s): "COLORURINE", "LABSPEC", "PHURINE", "GLUCOSEU", "HGBUR", "BILIRUBINUR", "KETONESUR", "PROTEINUR", "UROBILINOGEN", "NITRITE", "LEUKOCYTESUR" in the last 72 hours.  Invalid input(s): "APPERANCEUR"    Imaging: PERIPHERAL VASCULAR CATHETERIZATION Result  Date: 10/05/2023 See surgical note for result.  CARDIAC CATHETERIZATION Result Date: 10/04/2023 Images from the original result were not included.   Prox RCA to Mid RCA lesion is 90% stenosed.  Mid RCA lesion is 80% stenosed.Mid RCA lesion is 80% stenosed.   Non-stenotic Ost LM to Dist LM lesion.   Heavily calcified left coronary artery system was not able to be engaged due to inability to advance catheter to the left coronary cusp.   Not able to cross the aortic valve for measurement of the hemodynamics. Dominance: Right Heavily calcified somewhat tortuous RCA was difficult to engage: Prox RCA to Mid RCA lesion is 90% stenosed. Mid RCA lesion is 80% stenosed.Mid RCA lesion is 80% stenosed. Heavily calcified left coronary artery system was not able to be engaged due to inability to advance catheter to the left coronary cusp. Unable to engage the left coronary artery as the catheters were not advancing beyond the innominate artery and aortic knob. There is extreme tortuosity of the innominate artery and heavy calcifications not allowing the catheter be pushed freely through the artery and into the forearm or brachial aspect of the radial artery and worse around the tortuous innominate. RECOMMENDATIONS   Anticipated discharge date to be determined.   Recommend Aspirin  81mg  daily for moderate CAD.   Based on the presence of severe coronary disease at least in the one-vessel seen and heavy calcification in the left coronary system would strongly consider antiplatelet therapy with at a minimum aspirin  but even potentially Plavix based on elevated troponin levels.  No obvious invasive options for CAD management given  lack of viable access. Randene Bustard, MD      Medications:    ceFEPime  (MAXIPIME ) IV Stopped (10/05/23 2242)    aspirin  EC  81 mg Oral Daily   atorvastatin   80 mg Oral Daily   Chlorhexidine  Gluconate Cloth  6 each Topical Q0600   heparin  injection (subcutaneous)  5,000 Units Subcutaneous Q8H    insulin  aspart  0-15 Units Subcutaneous TID WC   insulin  aspart  0-5 Units Subcutaneous QHS   insulin  aspart  8 Units Subcutaneous TID WC   acetaminophen , alteplase , gabapentin , Gerhardt's butt cream, heparin , ondansetron  **OR** ondansetron  (ZOFRAN ) IV  Assessment/ Plan:  Amy Wu is a 73 y.o.  female with past medical history of hypertension, diabetes, PVD, dyslipidemia, and end-stage renal disease on dialysis. She presents to the ED via EMS for evaluation of altered mental status and fever and has been admitted under observation for Acute respiratory failure with hypoxia (HCC) [J96.01] Sepsis (HCC) [A41.9] Sepsis, due to unspecified organism, unspecified whether acute organ dysfunction present (HCC) [A41.9]  [facility]/MWF/Rt thigh Permcath  End stage renal disease on hemodialysis.  Last dialysis received for 4/15, line removed for line holiday. Appreciate vascular placing tunneled catheter on 4/18. Patient will receive dialysis later today. Next treatment scheduled for Monday.   Sepsis, unknown source at this time. Admitted with fever 103, and elevated white count, 12.2. Blood culture positive for serratia marcescens. ID request removal of tunneled catheter, removed on 4/15 with tip sent for culture. Catheter positive for same bacteria. Currently prescribed azithromycin  and cefepime .    Appreciate vascular replacing HD permcath. Outpatient clinic notified of antibiotics, Cefepime  2g/2g/2g until 4/30.   3. Acute respiratory failure requiring Bipap. Volume overload vs pneumonia.  Weaned to 2 L nasal cannula  4. Anemia of chronic kidney disease Lab Results  Component Value Date   HGB 10.6 (L) 10/06/2023  Hemoglobin at goal No acute indication for ESA   5. Secondary Hyperparathyroidism: with outpatient labs: None available   Lab Results  Component Value Date   CALCIUM  8.7 (L) 10/06/2023   PHOS 4.3 10/04/2023     Currently prescribed calcitriol  and cinacalcet   outpatient.  Calcium  and phosphorus acceptable.    LOS: 4 Sayra Frisby 4/19/202512:25 PM

## 2023-10-06 NOTE — Consult Note (Addendum)
 PHARMACY - ANTICOAGULATION CONSULT NOTE  Pharmacy Consult for heparin  infusion Indication: atrial fibrillation  Allergies  Allergen Reactions   Ancef  [Cefazolin ] Palpitations   Contrast Media [Iodinated Contrast Media] Palpitations    Patient Measurements: Weight: 97.4 kg (214 lb 11.7 oz) HDW: 65kg  Vital Signs: Temp: 97.4 F (36.3 C) (04/19 1256) Temp Source: Oral (04/19 1256) BP: 123/84 (04/19 1600) Pulse Rate: 79 (04/19 1600)  Labs: Recent Labs    10/03/23 1853 10/04/23 0441 10/04/23 0441 10/05/23 0556 10/06/23 0511  HGB  --  10.3*   < > 11.1* 10.6*  HCT  --  30.4*  --  33.4* 30.9*  PLT  --  115*  --  137* 181  HEPARINUNFRC 0.10* 0.18*  --  0.43  --   CREATININE  --  7.42*  --  8.34* 9.06*   < > = values in this interval not displayed.    Estimated Creatinine Clearance: 5.5 mL/min (A) (by C-G formula based on SCr of 9.06 mg/dL (H)).   Medical History: Past Medical History:  Diagnosis Date   Arthritis    Chronic kidney disease    Complication of anesthesia    Diabetes mellitus without complication (HCC)    Hyperlipidemia    Peripheral vascular disease (HCC)     Medications:  Patient has on heparin  infusion 4/16 to 4/18 for possible ACS  Assessment: 73 yo female being treated for [facility] bacteremia.  Patient was had previously been on heparin  for ACS this was then stopped after 48 hours.  Now patient found to be in Aflutter and heparin  to be resumed.  aPTT and PT/INR ordered  Goal of Therapy:  Heparin  level 0.3-0.7 units/ml Monitor platelets by anticoagulation protocol: Yes   Plan:  Give 3000 units bolus x 1 Start heparin  infusion at 1250 units/hr (patient was previously therapeutic on this rate) Check anti-Xa level in 8 hours and daily while on heparin  Continue to monitor H&H and platelets  Ramonita Burow, PharmD 10/06/2023,4:23 PM

## 2023-10-07 DIAGNOSIS — R7881 Bacteremia: Secondary | ICD-10-CM | POA: Diagnosis not present

## 2023-10-07 DIAGNOSIS — G9340 Encephalopathy, unspecified: Secondary | ICD-10-CM | POA: Diagnosis not present

## 2023-10-07 DIAGNOSIS — N186 End stage renal disease: Secondary | ICD-10-CM | POA: Diagnosis not present

## 2023-10-07 DIAGNOSIS — I483 Typical atrial flutter: Secondary | ICD-10-CM

## 2023-10-07 LAB — GLUCOSE, CAPILLARY
Glucose-Capillary: 237 mg/dL — ABNORMAL HIGH (ref 70–99)
Glucose-Capillary: 327 mg/dL — ABNORMAL HIGH (ref 70–99)
Glucose-Capillary: 274 mg/dL — ABNORMAL HIGH (ref 70–99)
Glucose-Capillary: 149 mg/dL — ABNORMAL HIGH (ref 70–99)

## 2023-10-07 LAB — BASIC METABOLIC PANEL WITH GFR
Anion gap: 14 (ref 5–15)
BUN: 40 mg/dL — ABNORMAL HIGH (ref 8–23)
CO2: 24 mmol/L (ref 22–32)
Calcium: 8.8 mg/dL — ABNORMAL LOW (ref 8.9–10.3)
Chloride: 96 mmol/L — ABNORMAL LOW (ref 98–111)
Creatinine, Ser: 6.13 mg/dL — ABNORMAL HIGH (ref 0.44–1.00)
GFR, Estimated: 7 mL/min — ABNORMAL LOW (ref >60)
Glucose, Bld: 288 mg/dL — ABNORMAL HIGH (ref 70–99)
Potassium: 3.5 mmol/L (ref 3.5–5.1)
Sodium: 134 mmol/L — ABNORMAL LOW (ref 135–145)

## 2023-10-07 LAB — CBC
HCT: 32.5 % — ABNORMAL LOW (ref 36.0–46.0)
Hemoglobin: 10.9 g/dL — ABNORMAL LOW (ref 12.0–15.0)
MCH: 26 pg (ref 26.0–34.0)
MCHC: 33.5 g/dL (ref 30.0–36.0)
MCV: 77.4 fL — ABNORMAL LOW (ref 80.0–100.0)
Platelets: 159 10*3/uL (ref 150–400)
RBC: 4.2 MIL/uL (ref 3.87–5.11)
RDW: 17.2 % — ABNORMAL HIGH (ref 11.5–15.5)
WBC: 11.1 10*3/uL — ABNORMAL HIGH (ref 4.0–10.5)
nRBC: 0.2 % (ref 0.0–0.2)

## 2023-10-07 LAB — HEPARIN LEVEL (UNFRACTIONATED)
Heparin Unfractionated: 0.49 [IU]/mL (ref 0.30–0.70)
Heparin Unfractionated: 0.4 [IU]/mL (ref 0.30–0.70)

## 2023-10-07 MED ORDER — INSULIN GLARGINE-YFGN 100 UNIT/ML ~~LOC~~ SOPN
5.0000 [IU] | PEN_INJECTOR | Freq: Every day | SUBCUTANEOUS | Status: DC
  Filled 2023-10-07: qty 3

## 2023-10-07 MED ORDER — INSULIN GLARGINE-YFGN 100 UNIT/ML ~~LOC~~ SOLN
5.0000 [IU] | Freq: Every day | SUBCUTANEOUS | Status: DC
  Administered 2023-10-07 – 2023-10-10 (×2): 5 [IU] via SUBCUTANEOUS
  Filled 2023-10-07 (×4): qty 0.05

## 2023-10-07 MED ORDER — INSULIN ASPART 100 UNIT/ML IJ SOLN
5.0000 [IU] | Freq: Three times a day (TID) | INTRAMUSCULAR | Status: DC
  Administered 2023-10-07 – 2023-10-09 (×5): 5 [IU] via SUBCUTANEOUS
  Filled 2023-10-07 (×5): qty 1

## 2023-10-07 NOTE — Plan of Care (Signed)
  Problem: Education: Goal: Knowledge of General Education information will improve Description: Including pain rating scale, medication(s)/side effects and non-pharmacologic comfort measures Outcome: Progressing   Problem: Health Behavior/Discharge Planning: Goal: Ability to manage health-related needs will improve Outcome: Progressing   Problem: Clinical Measurements: Goal: Ability to maintain clinical measurements within normal limits will improve Outcome: Progressing Goal: Will remain free from infection Outcome: Progressing Goal: Diagnostic test results will improve Outcome: Progressing Goal: Respiratory complications will improve Outcome: Progressing Goal: Cardiovascular complication will be avoided Outcome: Progressing   Problem: Activity: Goal: Risk for activity intolerance will decrease Outcome: Progressing   Problem: Nutrition: Goal: Adequate nutrition will be maintained Outcome: Progressing   Problem: Coping: Goal: Level of anxiety will decrease Outcome: Progressing   Problem: Elimination: Goal: Will not experience complications related to bowel motility Outcome: Progressing Goal: Will not experience complications related to urinary retention Outcome: Progressing   Problem: Pain Managment: Goal: General experience of comfort will improve and/or be controlled Outcome: Progressing   Problem: Safety: Goal: Ability to remain free from injury will improve Outcome: Progressing   Problem: Skin Integrity: Goal: Risk for impaired skin integrity will decrease Outcome: Progressing   Problem: Fluid Volume: Goal: Hemodynamic stability will improve Outcome: Progressing   Problem: Clinical Measurements: Goal: Diagnostic test results will improve Outcome: Progressing Goal: Signs and symptoms of infection will decrease Outcome: Progressing   Problem: Respiratory: Goal: Ability to maintain adequate ventilation will improve Outcome: Progressing   Problem:  Education: Goal: Ability to describe self-care measures that may prevent or decrease complications (Diabetes Survival Skills Education) will improve Outcome: Progressing Goal: Individualized Educational Video(s) Outcome: Progressing   Problem: Coping: Goal: Ability to adjust to condition or change in health will improve Outcome: Progressing   Problem: Fluid Volume: Goal: Ability to maintain a balanced intake and output will improve Outcome: Progressing   Problem: Health Behavior/Discharge Planning: Goal: Ability to identify and utilize available resources and services will improve Outcome: Progressing Goal: Ability to manage health-related needs will improve Outcome: Progressing   Problem: Metabolic: Goal: Ability to maintain appropriate glucose levels will improve Outcome: Progressing   Problem: Nutritional: Goal: Maintenance of adequate nutrition will improve Outcome: Progressing Goal: Progress toward achieving an optimal weight will improve Outcome: Progressing   Problem: Skin Integrity: Goal: Risk for impaired skin integrity will decrease Outcome: Progressing   Problem: Tissue Perfusion: Goal: Adequacy of tissue perfusion will improve Outcome: Progressing   Problem: Education: Goal: Understanding of CV disease, CV risk reduction, and recovery process will improve Outcome: Progressing Goal: Individualized Educational Video(s) Outcome: Progressing   Problem: Activity: Goal: Ability to return to baseline activity level will improve Outcome: Progressing   Problem: Cardiovascular: Goal: Ability to achieve and maintain adequate cardiovascular perfusion will improve Outcome: Progressing Goal: Vascular access site(s) Level 0-1 will be maintained Outcome: Progressing   Problem: Health Behavior/Discharge Planning: Goal: Ability to safely manage health-related needs after discharge will improve Outcome: Progressing

## 2023-10-07 NOTE — Evaluation (Signed)
 Physical Therapy Evaluation Patient Details Name: BRITTONY BILLICK MRN: [id number] DOB: 02/27/1951 Today's Date: 10/07/2023  History of Present Illness  Pt is a 73 y.o. female presenting to hospital 10/01/23 with c/o increasing somnolence; pt noted to be hypoxic.  Pt admitted with sepsis, acute respiratory failure with hypoxia, PNA, encephalopathy, ESRD on dialysis.  S/p L heart cath 10/04/23; L femoral vein tunneled HD catheter placement 10/05/23.  PMH includes ESRD on HD MWF, DM, HLD, PVD.  Clinical Impression  Prior to recent medical concerns, pt (and pt's significant other) reports pt using hoyer lift for transfers (but does perform slide-board transfers at times to/from vehicle with assist when she is feeling strong enough); lives with her significant other in 1 level home with ramp to enter; [facility] participant; uses [facility] transport to dialysis.  Pt noted with generalized confusion.  Currently pt is SBA to min assist with logrolling in bed; pt declined further activity (including sitting on EOB/transfers) d/t fatigue.  Pt would currently benefit from skilled PT to address noted impairments and functional limitations (see below for any additional details).  Upon hospital discharge, pt would benefit from ongoing therapy.     If plan is discharge home, recommend the following: Two people to help with walking and/or transfers;A lot of help with bathing/dressing/bathroom;Assistance with cooking/housework;Assist for transportation;Help with stairs or ramp for entrance   Can travel by private vehicle        Equipment Recommendations Hospital bed;Other (comment) (pt has hoyer lift and w/c's at home already)  Recommendations for Other Services       Functional Status Assessment Patient has had a recent decline in their functional status and/or demonstrates limited ability to make significant improvements in function in a reasonable and predictable amount of time     Precautions / Restrictions  Precautions Precautions: Fall Restrictions Weight Bearing Restrictions Per Provider Order: No Other Position/Activity Restrictions: R AKA; L BKA      Mobility  Bed Mobility Overal bed mobility: Needs Assistance Bed Mobility: Rolling Rolling: Min assist         General bed mobility comments: pt initially unable to logroll fully to L side but able on own on 2nd attempt with increased effort and use of momentum; min assist for hips to logroll to R side with increased effort/time    Transfers                   General transfer comment: Pt declined to sit on EOB or trial lateral scoot trial d/t fatigue    Ambulation/Gait               General Gait Details: Pt non-ambulatory  Stairs            Wheelchair Mobility     Tilt Bed    Modified Rankin (Stroke Patients Only)       Balance                                             Pertinent Vitals/Pain Pain Assessment Pain Assessment: No/denies pain HR 70 bpm and SpO2 sats 100% on 3 L O2 via nasal cannula post activity.    Home Living Family/patient expects to be discharged to:: Private residence Living Arrangements: Spouse/significant other Available Help at Discharge: Personal care attendant;Friend(s);Other (Comment) (significant other) Type of Home: House Home Access: Ramped entrance  Home Layout: One level Home Equipment: Tub bench;Grab bars - toilet;Other (comment);Wheelchair - power;Wheelchair - manual;Grab bars - tub/shower (sliding board; hoyer lift; bed pan) Additional Comments: Uses the hoyer lift or sliding board at baseline for transfers    Prior Function Prior Level of Function : Needs assist             Mobility Comments: [facility] van for HD appointments; uses manual w/c or power w/c depending on situation; will use slideboard vs hoyer lift to get in/out of car ADLs Comments: Husband assist with all ADLs/IADLs, uses birdbaths mostly, bed pans at baseline.  Aid comes to house to assist with grooming, dressings, some home management (3x/week)     Extremity/Trunk Assessment   Upper Extremity Assessment Upper Extremity Assessment: Overall WFL for tasks assessed;Defer to OT evaluation    Lower Extremity Assessment Lower Extremity Assessment: RLE deficits/detail;LLE deficits/detail RLE Deficits / Details: R AKA LLE Deficits / Details: L BKA       Communication   Communication Communication: No apparent difficulties    Cognition Arousal:  (Pt appearing sleepy during session) Behavior During Therapy: WFL for tasks assessed/performed   PT - Cognitive impairments: Orientation, Awareness, Memory, Attention, Problem solving, Safety/Judgement, Initiation   Orientation impairments: Time (Pt reported it was March 19th 2025)                     Following commands: Impaired Following commands impaired: Follows one step commands with increased time     Cueing Cueing Techniques: Verbal cues, Visual cues     General Comments  Nursing cleared pt for participation in physical therapy.  Pt agreeable to PT session.  Pt's significant other present during session.    Exercises     Assessment/Plan    PT Assessment Patient needs continued PT services  PT Problem List Decreased strength;Decreased mobility;Decreased activity tolerance       PT Treatment Interventions DME instruction;Functional mobility training;Therapeutic activities;Therapeutic exercise;Balance training;Patient/family education    PT Goals (Current goals can be found in the Care Plan section)  Acute Rehab PT Goals Patient Stated Goal: to improve strength PT Goal Formulation: With patient Time For Goal Achievement: 10/21/23 Potential to Achieve Goals: Fair    Frequency Min 1X/week     Co-evaluation PT/OT/SLP Co-Evaluation/Treatment: Yes Reason for Co-Treatment: To address functional/ADL transfers PT goals addressed during session: Mobility/safety with  mobility OT goals addressed during session: ADL's and self-care       AM-PAC PT "6 Clicks" Mobility  Outcome Measure Help needed turning from your back to your side while in a flat bed without using bedrails?: A Little Help needed moving from lying on your back to sitting on the side of a flat bed without using bedrails?: A Lot Help needed moving to and from a bed to a chair (including a wheelchair)?: Total Help needed standing up from a chair using your arms (e.g., wheelchair or bedside chair)?: Total Help needed to walk in hospital room?: Total Help needed climbing 3-5 steps with a railing? : Total 6 Click Score: 9    End of Session Equipment Utilized During Treatment: Oxygen (3 L via nasal cannula) Activity Tolerance: Patient limited by fatigue Patient left: in bed;with call bell/phone within reach;with bed alarm set;with family/visitor present Nurse Communication: Mobility status;Precautions PT Visit Diagnosis: Other abnormalities of gait and mobility (R26.89);Muscle weakness (generalized) (M62.81)    Time: 8295-6213 PT Time Calculation (min) (ACUTE ONLY): 24 min   Charges:   PT Evaluation $PT Eval Low  Complexity: 1 Low   PT General Charges $$ ACUTE PT VISIT: 1 Visit        Amador Junes, PT 10/07/23, 4:22 PM

## 2023-10-07 NOTE — Progress Notes (Signed)
 [facility]  ROUNDING NOTE   Subjective:   Amy Wu  is a 73 y.o.  female with past medical history of hypertension, diabetes, PVD, dyslipidemia, and end-stage renal disease on dialysis. She presents to the ED via EMS for evaluation of altered mental status and fever and has been admitted under observation for Acute respiratory failure with hypoxia (HCC) [J96.01] Sepsis (HCC) [A41.9] Sepsis, due to unspecified organism, unspecified whether acute organ dysfunction present [facility]) [A41.9]  Patient is known to our practice and receives outpatient dialysis treatments at [facility] on a MWF schedule, supervised by [facility] physicians.   Patient sitting up in bed No family at bedside States she feels well  Asking about d/c plan   Objective:  Vital signs in last 24 hours:  Temp:  [97 F (36.1 C)-98.6 F (37 C)] 98.6 F (37 C) (04/20 0727) Pulse Rate:  [64-130] 69 (04/20 0727) Resp:  [6-24] 18 (04/20 0727) BP: (92-166)/(60-103) 139/66 (04/20 0727) SpO2:  [100 %] 100 % (04/20 0727) FiO2 (%):  [30 %] 30 % (04/19 2313) Weight:  [96.4 kg-97.4 kg] 96.8 kg (04/20 0453)  Weight change: -0.8 kg Filed Weights   10/06/23 1256 10/06/23 1705 10/07/23 0453  Weight: 97.4 kg 96.4 kg 96.8 kg    Intake/Output: I/O last 3 completed shifts: In: 220 [P.O.:120; IV Piggyback:100] Out: 2000 [Other:2000]   Intake/Output this shift:  Total I/O In: 240 [P.O.:240] Out: -   Physical Exam: General: NAD  Head: Normocephalic, atraumatic.   Eyes: Anicteric  Lungs:  Diminished, [place] O2  Heart: Regular rate and rhythm  Abdomen:  Soft, nontender  Extremities:  Trace Rt stump peripheral edema. Bilat AKA  Neurologic: Alert, moving all four extremities  Skin: No lesions  Access: Lt thigh permcath placed 4/18    Basic Metabolic Panel: Recent Labs  Lab 10/03/23 0222 10/04/23 0441 10/05/23 0556 10/06/23 0511 10/07/23 0153  NA 133* 133* 133* 132* 134*  K 3.3* 3.3* 4.4 4.1 3.5   CL 97* 98 98 97* 96*  CO2 21* 23 21* 21* 24  GLUCOSE 148* 155* 318* 414* 288*  BUN 32* 40* 52* 60* 40*  CREATININE 6.23* 7.42* 8.34* 9.06* 6.13*  CALCIUM  8.3* 8.2* 8.6* 8.7* 8.8*  MG 1.9 2.1  --   --   --   PHOS 4.0 4.3  --   --   --     Liver Function Tests: Recent Labs  Lab 10/01/23 0912 10/02/23 0600  AST 25 81*  ALT 7 16  ALKPHOS 70 68  BILITOT 0.9 1.0  PROT 6.7 6.3*  ALBUMIN 3.1* 2.9*   No results for input(s): "LIPASE", "AMYLASE" in the last 168 hours. Recent Labs  Lab 10/01/23 2221  AMMONIA 12    CBC: Recent Labs  Lab 10/01/23 0912 10/02/23 0600 10/03/23 0222 10/04/23 0441 10/05/23 0556 10/06/23 0511 10/07/23 0153  WBC 12.3*   < > 5.2 7.1 7.4 10.7* 11.1*  NEUTROABS 10.7*  --   --   --   --   --   --   HGB 11.6*   < > 10.9* 10.3* 11.1* 10.6* 10.9*  HCT 35.9*   < > 32.4* 30.4* 33.4* 30.9* 32.5*  MCV 81.2   < > 78.8* 76.4* 76.1* 74.8* 77.4*  PLT 105*   < > 107* 115* 137* 181 159   < > = values in this interval not displayed.    Cardiac Enzymes: No results for input(s): "CKTOTAL", "CKMB", "CKMBINDEX", "TROPONINI" in the last  168 hours.  BNP: Invalid input(s): "POCBNP"  CBG: Recent Labs  Lab 10/06/23 0851 10/06/23 1125 10/06/23 1751 10/06/23 2024 10/07/23 0728  GLUCAP 430* 452* 150* 202* 237*    Microbiology: Results for orders placed or performed during the hospital encounter of 10/01/23  Resp panel by RT-PCR (RSV, Flu A&B, Covid) Anterior Nasal Swab     Status: Amy Wu   Collection Time: 10/01/23  9:12 AM   Specimen: Anterior Nasal Swab  Result Value Ref Range Status   SARS Coronavirus 2 by RT PCR NEGATIVE NEGATIVE Final    Comment: (NOTE) SARS-CoV-2 target nucleic acids are NOT DETECTED.  The SARS-CoV-2 RNA is generally detectable in upper respiratory specimens during the acute phase of infection. The lowest concentration of SARS-CoV-2 viral copies this assay can detect is 138 copies/mL. A negative result does not preclude  SARS-Cov-2 infection and should not be used as the sole basis for treatment or other patient management decisions. A negative result may occur with  improper specimen collection/handling, submission of specimen other than nasopharyngeal swab, presence of viral mutation(s) within the areas targeted by this assay, and inadequate number of viral copies(<138 copies/mL). A negative result must be combined with clinical observations, patient history, and epidemiological information. The expected result is Negative.  Fact Sheet for Patients:  BloggerCourse.com  Fact Sheet for Healthcare Providers:  SeriousBroker.it  This test is no t yet approved or cleared by the [place]  FDA and  has been authorized for detection and/or diagnosis of SARS-CoV-2 by FDA under an Emergency Use Authorization (EUA). This EUA will remain  in effect (meaning this test can be used) for the duration of the COVID-19 declaration under Section 564(b)(1) of the Act, 21 U.S.C.section 360bbb-3(b)(1), unless the authorization is terminated  or revoked sooner.       Influenza A by PCR NEGATIVE NEGATIVE Final   Influenza B by PCR NEGATIVE NEGATIVE Final    Comment: (NOTE) The Xpert Xpress SARS-CoV-2/FLU/RSV plus assay is intended as an aid in the diagnosis of influenza from Nasopharyngeal swab specimens and should not be used as a sole basis for treatment. Nasal washings and aspirates are unacceptable for Xpert Xpress SARS-CoV-2/FLU/RSV testing.  Fact Sheet for Patients: BloggerCourse.com  Fact Sheet for Healthcare Providers: SeriousBroker.it  This test is not yet approved or cleared by the [place]  FDA and has been authorized for detection and/or diagnosis of SARS-CoV-2 by FDA under an Emergency Use Authorization (EUA). This EUA will remain in effect (meaning this test can be used) for the duration of  the COVID-19 declaration under Section 564(b)(1) of the Act, 21 U.S.C. section 360bbb-3(b)(1), unless the authorization is terminated or revoked.     Resp Syncytial Virus by PCR NEGATIVE NEGATIVE Final    Comment: (NOTE) Fact Sheet for Patients: BloggerCourse.com  Fact Sheet for Healthcare Providers: SeriousBroker.it  This test is not yet approved or cleared by the [place]  FDA and has been authorized for detection and/or diagnosis of SARS-CoV-2 by FDA under an Emergency Use Authorization (EUA). This EUA will remain in effect (meaning this test can be used) for the duration of the COVID-19 declaration under Section 564(b)(1) of the Act, 21 U.S.C. section 360bbb-3(b)(1), unless the authorization is terminated or revoked.  Performed at [facility], [address] [place], [place] [address]   Blood Culture (routine x 2)     Status: Abnormal   Collection Time: 10/01/23  9:12 AM   Specimen: BLOOD  Result Value Ref Range Status   Specimen Description  Final    BLOOD BLOOD LEFT FOREARM Performed at [facility], [address] [place], [place] [address]    Special Requests   Final    BOTTLES DRAWN AEROBIC AND ANAEROBIC Blood Culture results may not be optimal due to an inadequate volume of blood received in culture bottles Performed at [facility], [address] [place], [place] [address]    Culture  Setup Time   Final    GRAM NEGATIVE RODS IN BOTH AEROBIC AND ANAEROBIC BOTTLES CRITICAL VALUE NOTED.  VALUE IS CONSISTENT WITH PREVIOUSLY REPORTED AND CALLED VALUE. Performed at [facility], [address] [place], [place] [address]    Culture (A)  Final    SERRATIA MARCESCENS SUSCEPTIBILITIES PERFORMED ON PREVIOUS CULTURE WITHIN THE LAST 5 DAYS. Performed at [facility], [address], [place], [place] [address]    Report Status 10/04/2023 FINAL  Final  Respiratory  (~20 pathogens) panel by PCR     Status: Amy Wu   Collection Time: 10/01/23  9:12 AM   Specimen: Nasopharyngeal Swab; Respiratory  Result Value Ref Range Status   Adenovirus NOT DETECTED NOT DETECTED Final   Coronavirus 229E NOT DETECTED NOT DETECTED Final    Comment: (NOTE) The Coronavirus on the Respiratory Panel, DOES NOT test for the novel  Coronavirus (2019 nCoV)    Coronavirus HKU1 NOT DETECTED NOT DETECTED Final   Coronavirus NL63 NOT DETECTED NOT DETECTED Final   Coronavirus OC43 NOT DETECTED NOT DETECTED Final   Metapneumovirus NOT DETECTED NOT DETECTED Final   Rhinovirus / Enterovirus NOT DETECTED NOT DETECTED Final   Influenza A NOT DETECTED NOT DETECTED Final   Influenza B NOT DETECTED NOT DETECTED Final   Parainfluenza Virus 1 NOT DETECTED NOT DETECTED Final   Parainfluenza Virus 2 NOT DETECTED NOT DETECTED Final   Parainfluenza Virus 3 NOT DETECTED NOT DETECTED Final   Parainfluenza Virus 4 NOT DETECTED NOT DETECTED Final   Respiratory Syncytial Virus NOT DETECTED NOT DETECTED Final   Bordetella pertussis NOT DETECTED NOT DETECTED Final   Bordetella Parapertussis NOT DETECTED NOT DETECTED Final   Chlamydophila pneumoniae NOT DETECTED NOT DETECTED Final   Mycoplasma pneumoniae NOT DETECTED NOT DETECTED Final    Comment: Performed at [facility], [address], [place], [place] [address]  Blood Culture (routine x 2)     Status: Abnormal   Collection Time: 10/01/23  9:33 AM   Specimen: BLOOD  Result Value Ref Range Status   Specimen Description   Final    BLOOD RIGHT WRIST Performed at [facility], [address], [place], [place] [address]    Special Requests   Final    BOTTLES DRAWN AEROBIC AND ANAEROBIC Blood Culture adequate volume Performed at [facility], [address], [place], [place] [address]    Culture  Setup Time   Final    GRAM NEGATIVE RODS IN BOTH AEROBIC AND ANAEROBIC BOTTLES CRITICAL RESULT CALLED TO, READ BACK BY AND  VERIFIED WITH:  NATHAN BELUE AT 0457 10/02/23 JG Performed at [facility], [address] [place], [place] [address]    Culture SERRATIA MARCESCENS (A)  Final   Report Status 10/04/2023 FINAL  Final   Organism ID, Bacteria SERRATIA MARCESCENS  Final      Susceptibility   Serratia marcescens - MIC*    CEFEPIME  <=0.12 SENSITIVE Sensitive     CEFTAZIDIME <=1 SENSITIVE Sensitive     CEFTRIAXONE  <=0.25 SENSITIVE Sensitive  CIPROFLOXACIN  <=0.25 SENSITIVE Sensitive     GENTAMICIN <=1 SENSITIVE Sensitive     TRIMETH /SULFA  <=20 SENSITIVE Sensitive     * SERRATIA MARCESCENS  Blood Culture ID Panel (Reflexed)     Status: Abnormal   Collection Time: 10/01/23  9:33 AM  Result Value Ref Range Status   Enterococcus faecalis NOT DETECTED NOT DETECTED Final   Enterococcus Faecium NOT DETECTED NOT DETECTED Final   Listeria monocytogenes NOT DETECTED NOT DETECTED Final   Staphylococcus species NOT DETECTED NOT DETECTED Final   Staphylococcus aureus (BCID) NOT DETECTED NOT DETECTED Final   Staphylococcus epidermidis NOT DETECTED NOT DETECTED Final   Staphylococcus lugdunensis NOT DETECTED NOT DETECTED Final   Streptococcus species NOT DETECTED NOT DETECTED Final   Streptococcus agalactiae NOT DETECTED NOT DETECTED Final   Streptococcus pneumoniae NOT DETECTED NOT DETECTED Final   Streptococcus pyogenes NOT DETECTED NOT DETECTED Final   A.calcoaceticus-baumannii NOT DETECTED NOT DETECTED Final   Bacteroides fragilis NOT DETECTED NOT DETECTED Final   Enterobacterales DETECTED (A) NOT DETECTED Final    Comment: Enterobacterales represent a large order of gram negative bacteria, not a single organism. CRITICAL RESULT CALLED TO, READ BACK BY AND VERIFIED WITH:  NATHAN BELUE AT 0457 10/02/23 JG    Enterobacter cloacae complex NOT DETECTED NOT DETECTED Final   Escherichia coli NOT DETECTED NOT DETECTED Final   Klebsiella aerogenes NOT DETECTED NOT DETECTED Final   Klebsiella oxytoca NOT  DETECTED NOT DETECTED Final   Klebsiella pneumoniae NOT DETECTED NOT DETECTED Final   Proteus species NOT DETECTED NOT DETECTED Final   Salmonella species NOT DETECTED NOT DETECTED Final   Serratia marcescens DETECTED (A) NOT DETECTED Final    Comment: CRITICAL RESULT CALLED TO, READ BACK BY AND VERIFIED WITH:  NATHAN BELUE AT 0457 10/02/23 JG    Haemophilus influenzae NOT DETECTED NOT DETECTED Final   Neisseria meningitidis NOT DETECTED NOT DETECTED Final   Pseudomonas aeruginosa NOT DETECTED NOT DETECTED Final   Stenotrophomonas maltophilia NOT DETECTED NOT DETECTED Final   Candida albicans NOT DETECTED NOT DETECTED Final   Candida auris NOT DETECTED NOT DETECTED Final   Candida glabrata NOT DETECTED NOT DETECTED Final   Candida krusei NOT DETECTED NOT DETECTED Final   Candida parapsilosis NOT DETECTED NOT DETECTED Final   Candida tropicalis NOT DETECTED NOT DETECTED Final   Cryptococcus neoformans/gattii NOT DETECTED NOT DETECTED Final   CTX-M ESBL NOT DETECTED NOT DETECTED Final   Carbapenem resistance IMP NOT DETECTED NOT DETECTED Final   Carbapenem resistance KPC NOT DETECTED NOT DETECTED Final   Carbapenem resistance NDM NOT DETECTED NOT DETECTED Final   Carbapenem resist OXA 48 LIKE NOT DETECTED NOT DETECTED Final   Carbapenem resistance VIM NOT DETECTED NOT DETECTED Final    Comment: Performed at [facility], [address] [place], [place] [address]  MRSA Next Gen by PCR, Nasal     Status: Amy Wu   Collection Time: 10/02/23  5:26 PM   Specimen: Nasal Mucosa; Nasal Swab  Result Value Ref Range Status   MRSA by PCR Next Gen NOT DETECTED NOT DETECTED Final    Comment: (NOTE) The GeneXpert MRSA Assay (FDA approved for NASAL specimens only), is one component of a comprehensive MRSA colonization surveillance program. It is not intended to diagnose MRSA infection nor to guide or monitor treatment for MRSA infections. Test performance is not FDA approved in patients  less than 19 years old. Performed at [facility], [address], [place], [place] [address]  Cath Tip Culture     Status: Abnormal (Preliminary result)   Collection Time: 10/02/23  9:28 PM   Specimen: Catheter Tip; Other  Result Value Ref Range Status   Specimen Description   Final    CATH TIP Performed at [facility], [address] [place], [place] [address]    Special Requests   Final    Amy Wu Performed at [facility], [address] [place], [place] [address]    Culture (A)  Final    SERRATIA MARCESCENS SUSCEPTIBILITIES TO FOLLOW Performed at [facility], [address] [place], [place] [address]    Report Status PENDING  Incomplete  Culture, blood (Routine X 2) w Reflex to ID Panel     Status: Amy Wu (Preliminary result)   Collection Time: 10/03/23  1:00 PM   Specimen: BLOOD  Result Value Ref Range Status   Specimen Description BLOOD BLOOD LEFT WRIST  Final   Special Requests   Final    BOTTLES DRAWN AEROBIC ONLY Blood Culture adequate volume   Culture   Final    NO GROWTH 4 DAYS Performed at [facility], [address], [place], [place] [address]    Report Status PENDING  Incomplete  Culture, blood (Routine X 2) w Reflex to ID Panel     Status: Amy Wu (Preliminary result)   Collection Time: 10/03/23  1:04 PM   Specimen: BLOOD  Result Value Ref Range Status   Specimen Description BLOOD BLOOD LEFT FOREARM  Final   Special Requests   Final    BOTTLES DRAWN AEROBIC ONLY Blood Culture adequate volume   Culture   Final    NO GROWTH 4 DAYS Performed at [facility], [address], [place], [place] [address]    Report Status PENDING  Incomplete    Coagulation Studies: Recent Labs    10/06/23 Oct 26, 2101  LABPROT 15.7*  INR 1.2     Urinalysis: No results for input(s): "COLORURINE", "LABSPEC", "PHURINE", "GLUCOSEU", "HGBUR", "BILIRUBINUR", "KETONESUR", "PROTEINUR", "UROBILINOGEN", "NITRITE", "LEUKOCYTESUR" in  the last 72 hours.  Invalid input(s): "APPERANCEUR"    Imaging: PERIPHERAL VASCULAR CATHETERIZATION Result Date: 10/05/2023 See surgical note for result.     Medications:    ceFEPime  (MAXIPIME ) IV 1 g (10/06/23 10/26/37)   heparin  1,250 Units/hr (10/06/23 1808)    amiodarone   200 mg Oral BID   aspirin  EC  81 mg Oral Daily   atorvastatin   80 mg Oral Daily   Chlorhexidine  Gluconate Cloth  6 each Topical Q0600   insulin  aspart  0-15 Units Subcutaneous TID WC   insulin  aspart  0-5 Units Subcutaneous QHS   insulin  aspart  5 Units Subcutaneous TID WC   acetaminophen , gabapentin , Gerhardt's butt cream, ondansetron  **OR** ondansetron  (ZOFRAN ) IV  Assessment/ Plan:  Amy Wu is a 73 y.o.  female with past medical history of hypertension, diabetes, PVD, dyslipidemia, and end-stage renal disease on dialysis. She presents to the ED via EMS for evaluation of altered mental status and fever and has been admitted under observation for Acute respiratory failure with hypoxia (HCC) [J96.01] Sepsis (HCC) [A41.9] Sepsis, due to unspecified organism, unspecified whether acute organ dysfunction present (HCC) [A41.9]  [facility]/MWF/Rt thigh Permcath  End stage renal disease on hemodialysis.  Next treatment scheduled for Monday.   Sepsis, unknown source at this time. Admitted with fever 103, and elevated white count, 12.2. Blood culture positive for serratia marcescens. ID request removal of tunneled catheter, removed on 4/15 with  tip sent for culture. Catheter positive for same bacteria. Currently prescribed cefepime .    Appreciate vascular replacing HD permcath. Outpatient clinic notified of antibiotics, Cefepime  2g/2g/2g until 4/30.   3. Acute respiratory failure requiring Bipap. Volume overload.  2 L nasal cannula  4. Anemia of chronic kidney disease Lab Results  Component Value Date   HGB 10.9 (L) 10/07/2023  Hemoglobin at goal No acute indication for ESA   5.  Secondary Hyperparathyroidism: with outpatient labs: Amy Wu available   Lab Results  Component Value Date   CALCIUM  8.8 (L) 10/07/2023   PHOS 4.3 10/04/2023     Currently prescribed calcitriol  and cinacalcet  outpatient.  Will continue to monitor bone minerals.    LOS: 5 Yeslin Delio 4/20/202511:42 AM

## 2023-10-07 NOTE — Consult Note (Signed)
 PHARMACY - ANTICOAGULATION CONSULT NOTE  Pharmacy Consult for heparin  infusion Indication: atrial fibrillation  Allergies  Allergen Reactions   Ancef  [Cefazolin ] Palpitations   Contrast Media [Iodinated Contrast Media] Palpitations    Patient Measurements: Height: 4\' 9"  (144.8 cm) Weight: 96.8 kg (213 lb 6.5 oz) IBW/kg (Calculated) : 38.6 HDW: 65kg  Vital Signs: Temp: 98.6 F (37 C) (04/20 0727) Temp Source: Oral (04/20 0727) BP: 139/66 (04/20 0727) Pulse Rate: 69 (04/20 0727)  Labs: Recent Labs    10/05/23 0556 10/06/23 0511 10/06/23 2103 10/07/23 0153 10/07/23 1014  HGB 11.1* 10.6*  --  10.9*  --   HCT 33.4* 30.9*  --  32.5*  --   PLT 137* 181  --  159  --   APTT  --   --  158*  --   --   LABPROT  --   --  15.7*  --   --   INR  --   --  1.2  --   --   HEPARINUNFRC 0.43  --   --  0.49 0.40  CREATININE 8.34* 9.06*  --  6.13*  --     Estimated Creatinine Clearance: 8.1 mL/min (A) (by C-G formula based on SCr of 6.13 mg/dL (H)).   Medical History: Past Medical History:  Diagnosis Date   Arthritis    Chronic kidney disease    Complication of anesthesia    Diabetes mellitus without complication (HCC)    Hyperlipidemia    Peripheral vascular disease (HCC)     Medications:  Patient has on heparin  infusion 4/16 to 4/18 for possible ACS  Assessment: 73 yo female being treated for [facility] bacteremia.  Patient was had previously been on heparin  for ACS this was then stopped after 48 hours.  Now patient found to be in Aflutter and heparin  to be resumed.  aPTT and PT/INR ordered  Goal of Therapy:  Heparin  level 0.3-0.7 units/ml Monitor platelets by anticoagulation protocol: Yes  04/20 0153 HL 0.49, therapeutic x 1 04/20 1014 HL 0.40, therapeutic x 2   Plan:  Continue heparin  infusion at 1250 units/hr Check anti-Xa level tomorrow with morning labs Continue to monitor H&H and platelets  Cristal Don, PharmD Clinical Pharmacist 10/07/2023 11:04  AM

## 2023-10-07 NOTE — Inpatient Diabetes Management (Addendum)
 Inpatient Diabetes Program Recommendations  AACE/ADA: New Consensus Statement on Inpatient Glycemic Control   Target Ranges:  Prepandial:   less than 140 mg/dL      Peak postprandial:   less than 180 mg/dL (1-2 hours)      Critically ill patients:  140 - 180 mg/dL    Latest Reference Range & Units 10/07/23 07:28 10/07/23 12:16  Glucose-Capillary 70 - 99 mg/dL 161 (H) 096 (H)    Latest Reference Range & Units 10/06/23 08:51 10/06/23 11:25 10/06/23 17:51 10/06/23 20:24  Glucose-Capillary 70 - 99 mg/dL 045 (H) 409 (H) 811 (H) 202 (H)    Latest Reference Range & Units 10/05/23 07:29 10/05/23 10:50 10/05/23 12:41 10/05/23 16:26 10/05/23 18:06 10/05/23 21:13  Glucose-Capillary 70 - 99 mg/dL 914 (H) 782 (H) 956 (H) 343 (H) 372 (H) 360 (H)    Latest Reference Range & Units 10/04/23 07:32 10/04/23 11:27 10/04/23 15:58 10/04/23 21:22  Glucose-Capillary 70 - 99 mg/dL 213 (H) 086 (H) 578 (H) 301 (H)    Latest Reference Range & Units 10/01/23 22:21  Hemoglobin A1C 4.8 - 5.6 % 6.5 (H)   Review of Glycemic Control  Diabetes history: DM2 Outpatient Diabetes medications: Januvia 25 mg daily Current orders for Inpatient glycemic control: Novolog  0-15 units TID with meals, Novolog  0-5 units at bedtime, Novolog  5 units TID with meals  Inpatient Diabetes Program Recommendations:    Insulin : Noted Solumedrol 125 mg given at 13:27 on 09/1723. Anticipate Solumedrol contributing to hyperglycemia.  May want to order one time Semglee  5 units x 1 now.  NOTE: Noted consult for Diabetes Coordinator. Diabetes Coordinator is not on campus over the weekend but available by pager from 8am to 5pm for questions or concerns. Inpatient diabetes coordinator to follow up with patient on Monday.  Addendum 10/08/23@11 :25-Spoke with patient on dialysis unit. Patient initially had head covered up but answered to name and took blanket off her face to have discussion. Patient was drowsy through out the conversation and she was  not able to provide much information. Patient states that she takes a pill for DM at home and her glucose is usually in the 100's mg/dl. Patient does finger sticks at home. Inquired about any prior use of CGM and patient states she has not used a CGM. Patient states she would prefer to do finger sticks at home as she has been doing.  Dicussed hyperglycemia noted over the past few days and that she received a steroid which may be the cause of hyperglycemia. Patient reports she has no questions or concerns at this time regarding DM.  Thanks,  Beacher Limerick, RN, MSN, CDCES Diabetes Coordinator Inpatient Diabetes Program [phone] (Team Pager from 8am to 5pm)

## 2023-10-07 NOTE — Progress Notes (Signed)
 PROGRESS NOTE    Amy Wu   AOZ:[id number] DOB: [birth year]  DOA: 10/01/2023 Date of Service: 10/07/23 which is hospital day 5  PCP: Izella Marshal, Jill E, [facility] course / significant events:   HPI:  Amy Wu is a 73 y.o. female with medical history significant of end-stage renal disease on hemodialysis MWF, type 2 diabetes, hyperlipidemia, peripheral vascular disease presenting to ED 10/01/23 from home with generalized malaise and fevers, confusion per family, mild cough/SOB. No abdominal pain but some diarrhea. Missed hemodialysis 04/13 secondary to symptoms.   04/14: admitted to hospitalist service for sepsis as well as acute resp fail w/ hypoxia and concern for volume overload. Requiring BiPAP  04/15: (+)bacteremia w/ serratia, abx to cefepime . Pt transferred to the stepdown unit for pressors. ID consult, HD catheter removed due to likely source of sepsis. Elevated troponins, started heparin   04/16: Off levo later today and onto scheduled midodrine . Echo ordered. Cardiology consulted, continue heparin  infusion, not on beta-blocker secondary to bradycardia, scheduled for cardiac cath on 04/17. Transthoracic echo ok. Repeating BCx today. Plan reinsertion HD cath 04/18.  04/17: LHC today. Significant CAD, no PCI 04/18: L femoral dialysis cath placed, planning dialysis tomorrow. Glc substantial increase, adjusting sliding scale  04/19: New onset atrial flutter, cardiology following, started heparin  and amiodarone . Dialysis today. Still quite hyperglycemic.  04/20: converted to sinus. Son unable to get meds from pharmacy, pt will need Eliquis  - will plan dialysis tomorrow, transition to eliquis  and confirm home meds access and DME, hopefully will be able to dc tomorrow    Consultants:  Infectious disease Vascular surgery  [facility] Cardiology  Procedures/Surgeries: 10/04/23 left heart cath   10/05/23 placement permcath - tunneled hemodialysis catheter via the left femoral vein        ASSESSMENT & PLAN:   Severe Sepsis w/ Serratia bacteremia likely source HD catheter which has been removed Complicated by septic shock, see below  s/p HD catheter placement 10/05/23 following neg repeat BCx Cefepime  to continue at dialysis outpatient, see med rec, confirmed w/ nephrology this has been ordered   Septic shock requiring vasopressor support, shock resolved and now off IV pressors Treat underlying cause(s) as noted midodrine   New atrial flutter New onset this morning 04/19 Documented on EKG today Rate controlled in the 70s Asymptomatic start amiodarone  200 twice daily x 2 weeks then down to 200 daily   heparin  infusion, plan eliquis  on discharge   Elevated troponin secondary NSTEMI vs. demand ischemia  Coronary Artery Disease w/ extensive calcification / atherosclerotic disease  Hx: HTN, HLD and PVD Echo 10/02/23: EF >55%; mild tricuspid valve regurgitation; trivial pulmonic valve regurgitation  Cardiac catheterization was attempted 10/04/23 but was very challenging due to difficult vascular anatomy. Only the right coronary artery could be engaged and was found to have sequential 80 to 90% lesions.  aspirin  81 mg daily  consider adding clopidogrel if hemoglobin remains stable but now will likely need anticoagulation w/ Eliquis  d/t aflutter see above  atorvastatin   Beta blocker has caused bradycardia/hypotension, defer this medication    Acute hypoxic respiratory failure - improved  Likely d/t pulmonary vascular congestion d/t missed dialysis Supplemental O2 for dyspnea and/or hypoxia  Maintain O2 sats 92% or higher  Fluid removal with HD   Acute metabolic encephalopathy d/t severe sepsis - resolved  Treat underlying cause(s) as noted Correct metabolic derangements  Avoid sedating medications as able  Maintain sleep/wake cycle     ESRD on HD (M-W-F) Hyponatremia  s/p HD catheter placement 10/05/23 Trend BMP per nephrology / as needed  Replace  electrolytes as indicated  Nephrology consulted appreciate input   Elevated AST  Trend hepatic function panel  Avoid hepatotoxic agents as able    Type II diabetes mellitus  PT had previously been d/c insulin  outpatient d/t severe hypoglycemia, was normoglycemic initially and A1C <7 however Glc past few days has been quite high, diet has been non-carb-controlled,  CBG's ac/hs/prn Updated diet to renal/carb   SSI increased  Target CBG's 140 to 180     Class 3 obesity based on BMI: Body mass index is 47.66 kg/m.  Underweight - under 18  overweight - 25 to 29 obese - 30 or more Class 1 obesity: BMI of 30.0 to 34 Class 2 obesity: BMI of 35.0 to 39 Class 3 obesity: BMI of 40.0 to 49 Super Morbid Obesity: BMI 50-59 Super-super Morbid Obesity: BMI 60+ Significantly low or high BMI is associated with higher medical risk.  Weight management advised as adjunct to other disease management and risk reduction treatments    DVT prophylaxis: on heparin  gtt  IV fluids: no continuous IV fluids  Nutrition: NPO pending cardiac cath Central lines / other devices: none  Code Status: DNR ACP documentation reviewed:  none on file in [facility]  TOC needs: TBD Medical barriers to dispo: new aflutter, hyperglycemia. Expected medical readiness for discharge no sooner than 04/20              Subjective / Brief ROS:  Patient reports feeling good today Denies CP/SOB.  Pain controlled.  Denies new weakness.    Family Communication: family at bedside on rounds (pt's brother) and pt's son on speaker phone on rounds     Objective Findings:  Vitals:   10/07/23 0342 10/07/23 0344 10/07/23 0453 10/07/23 0727  BP:    139/66  Pulse:    69  Resp: 14   18  Temp:    98.6 F (37 C)  TempSrc:    Oral  SpO2:  100%  100%  Weight:   96.8 kg   Height:        Intake/Output Summary (Last 24 hours) at 10/07/2023 1454 Last data filed at 10/07/2023 0900 Gross per 24 hour  Intake 360 ml   Output 2000 ml  Net -1640 ml   Filed Weights   10/06/23 1256 10/06/23 1705 10/07/23 0453  Weight: 97.4 kg 96.4 kg 96.8 kg    Examination:  Physical Exam Constitutional:      General: She is not in acute distress. Cardiovascular:     Rate and Rhythm: Normal rate and regular rhythm.  Pulmonary:     Effort: Pulmonary effort is normal.     Breath sounds: Normal breath sounds.  Abdominal:     Palpations: Abdomen is soft.  Skin:    General: Skin is warm and dry.  Neurological:     General: No focal deficit present.     Mental Status: She is alert and oriented to person, place, and time. Mental status is at baseline.     Comments: More alert and less tangential today   Psychiatric:        Mood and Affect: Mood normal.        Behavior: Behavior normal.          Scheduled Medications:   amiodarone   200 mg Oral BID   aspirin  EC  81 mg Oral Daily   atorvastatin   80 mg Oral Daily   Chlorhexidine   Gluconate Cloth  6 each Topical Q0600   insulin  aspart  0-15 Units Subcutaneous TID WC   insulin  aspart  0-5 Units Subcutaneous QHS   insulin  aspart  5 Units Subcutaneous TID WC   insulin  glargine-yfgn  5 Units Subcutaneous Q2200    Continuous Infusions:  ceFEPime  (MAXIPIME ) IV 1 g (10/06/23 2239)   heparin  1,250 Units/hr (10/06/23 1808)    PRN Medications:  acetaminophen , gabapentin , Gerhardt's butt cream, ondansetron  **OR** ondansetron  (ZOFRAN ) IV  Antimicrobials from admission:  Anti-infectives (From admission, onward)    Start     Dose/Rate Route Frequency Ordered Stop   10/05/23 1021  vancomycin  (VANCOCIN ) IVPB 1000 mg/200 mL premix        1,000 mg 200 mL/hr over 60 Minutes Intravenous 60 min pre-op 10/05/23 1021 10/05/23 1214   10/03/23 1000  levofloxacin  (LEVAQUIN ) IVPB 500 mg  Status:  Discontinued        500 mg 100 mL/hr over 60 Minutes Intravenous Every 48 hours 10/01/23 1445 10/01/23 1458   10/03/23 1000  cefTRIAXone  (ROCEPHIN ) 2 g in sodium chloride  0.9 % 100  mL IVPB  Status:  Discontinued        2 g 200 mL/hr over 30 Minutes Intravenous Every 24 hours 10/01/23 1459 10/02/23 0550   10/02/23 2200  ceFEPIme  (MAXIPIME ) 1 g in sodium chloride  0.9 % 100 mL IVPB       Placed in "Followed by" Linked Group   1 g 200 mL/hr over 30 Minutes Intravenous Every 24 hours 10/02/23 0550     10/02/23 1000  azithromycin  (ZITHROMAX ) 500 mg in sodium chloride  0.9 % 250 mL IVPB  Status:  Discontinued        500 mg 250 mL/hr over 60 Minutes Intravenous Daily 10/01/23 1459 10/02/23 1553   10/02/23 0645  ceFEPIme  (MAXIPIME ) 1 g in sodium chloride  0.9 % 100 mL IVPB       Placed in "Followed by" Linked Group   1 g 200 mL/hr over 30 Minutes Intravenous  Once 10/02/23 0550 10/02/23 0717   10/01/23 0915  vancomycin  (VANCOCIN ) IVPB 1000 mg/200 mL premix        1,000 mg 200 mL/hr over 60 Minutes Intravenous  Once 10/01/23 0913 10/01/23 1058   10/01/23 0915  levofloxacin  (LEVAQUIN ) IVPB 750 mg        750 mg 100 mL/hr over 90 Minutes Intravenous  Once 10/01/23 0913 10/01/23 1807           Data Reviewed:  I have personally reviewed the following...  CBC: Recent Labs  Lab 10/01/23 0912 10/02/23 0600 10/03/23 0222 10/04/23 0441 10/05/23 0556 10/06/23 0511 10/07/23 0153  WBC 12.3*   < > 5.2 7.1 7.4 10.7* 11.1*  NEUTROABS 10.7*  --   --   --   --   --   --   HGB 11.6*   < > 10.9* 10.3* 11.1* 10.6* 10.9*  HCT 35.9*   < > 32.4* 30.4* 33.4* 30.9* 32.5*  MCV 81.2   < > 78.8* 76.4* 76.1* 74.8* 77.4*  PLT 105*   < > 107* 115* 137* 181 159   < > = values in this interval not displayed.   Basic Metabolic Panel: Recent Labs  Lab 10/03/23 0222 10/04/23 0441 10/05/23 0556 10/06/23 0511 10/07/23 0153  NA 133* 133* 133* 132* 134*  K 3.3* 3.3* 4.4 4.1 3.5  CL 97* 98 98 97* 96*  CO2 21* 23 21* 21* 24  GLUCOSE 148* 155* 318* 414* 288*  BUN  32* 40* 52* 60* 40*  CREATININE 6.23* 7.42* 8.34* 9.06* 6.13*  CALCIUM  8.3* 8.2* 8.6* 8.7* 8.8*  MG 1.9 2.1  --   --   --    PHOS 4.0 4.3  --   --   --    GFR: Estimated Creatinine Clearance: 8.1 mL/min (A) (by C-G formula based on SCr of 6.13 mg/dL (H)). Liver Function Tests: Recent Labs  Lab 10/01/23 0912 10/02/23 0600  AST 25 81*  ALT 7 16  ALKPHOS 70 68  BILITOT 0.9 1.0  PROT 6.7 6.3*  ALBUMIN 3.1* 2.9*   No results for input(s): "LIPASE", "AMYLASE" in the last 168 hours. Recent Labs  Lab 10/01/23 2221  AMMONIA 12   Coagulation Profile: Recent Labs  Lab 10/01/23 0912 10/03/23 1018 10/06/23 2103  INR 1.4* 1.2 1.2   Cardiac Enzymes: No results for input(s): "CKTOTAL", "CKMB", "CKMBINDEX", "TROPONINI" in the last 168 hours. BNP (last 3 results) No results for input(s): "PROBNP" in the last 8760 hours. HbA1C: No results for input(s): "HGBA1C" in the last 72 hours.  CBG: Recent Labs  Lab 10/06/23 1125 10/06/23 1751 10/06/23 2024 10/07/23 0728 10/07/23 1216  GLUCAP 452* 150* 202* 237* 327*   Lipid Profile: No results for input(s): "CHOL", "HDL", "LDLCALC", "TRIG", "CHOLHDL", "LDLDIRECT" in the last 72 hours.  Thyroid Function Tests: No results for input(s): "TSH", "T4TOTAL", "FREET4", "T3FREE", "THYROIDAB" in the last 72 hours. Anemia Panel: No results for input(s): "VITAMINB12", "FOLATE", "FERRITIN", "TIBC", "IRON", "RETICCTPCT" in the last 72 hours. Most Recent Urinalysis On File:     Component Value Date/Time   COLORURINE Yellow 12/28/2011 1746   APPEARANCEUR Turbid 12/28/2011 1746   LABSPEC 1.010 12/28/2011 1746   PHURINE 8.0 12/28/2011 1746   GLUCOSEU >=500 12/28/2011 1746   HGBUR 2+ 12/28/2011 1746   BILIRUBINUR Negative 12/28/2011 1746   KETONESUR Negative 12/28/2011 1746   PROTEINUR 100 mg/dL 16/03/9603 5409   NITRITE Negative 12/28/2011 1746   LEUKOCYTESUR 3+ 12/28/2011 1746   Sepsis Labs: @LABRCNTIP (procalcitonin:4,lacticidven:4) Microbiology: Recent Results (from the past 240 hours)  Resp panel by RT-PCR (RSV, Flu A&B, Covid) Anterior Nasal Swab      Status: None   Collection Time: 10/01/23  9:12 AM   Specimen: Anterior Nasal Swab  Result Value Ref Range Status   SARS Coronavirus 2 by RT PCR NEGATIVE NEGATIVE Final    Comment: (NOTE) SARS-CoV-2 target nucleic acids are NOT DETECTED.  The SARS-CoV-2 RNA is generally detectable in upper respiratory specimens during the acute phase of infection. The lowest concentration of SARS-CoV-2 viral copies this assay can detect is 138 copies/mL. A negative result does not preclude SARS-Cov-2 infection and should not be used as the sole basis for treatment or other patient management decisions. A negative result may occur with  improper specimen collection/handling, submission of specimen other than nasopharyngeal swab, presence of viral mutation(s) within the areas targeted by this assay, and inadequate number of viral copies(<138 copies/mL). A negative result must be combined with clinical observations, patient history, and epidemiological information. The expected result is Negative.  Fact Sheet for Patients:  BloggerCourse.com  Fact Sheet for Healthcare Providers:  SeriousBroker.it  This test is no t yet approved or cleared by the [place]  FDA and  has been authorized for detection and/or diagnosis of SARS-CoV-2 by FDA under an Emergency Use Authorization (EUA). This EUA will remain  in effect (meaning this test can be used) for the duration of the COVID-19 declaration under Section 564(b)(1) of the Act, 21 U.S.C.section  360bbb-3(b)(1), unless the authorization is terminated  or revoked sooner.       Influenza A by PCR NEGATIVE NEGATIVE Final   Influenza B by PCR NEGATIVE NEGATIVE Final    Comment: (NOTE) The Xpert Xpress SARS-CoV-2/FLU/RSV plus assay is intended as an aid in the diagnosis of influenza from Nasopharyngeal swab specimens and should not be used as a sole basis for treatment. Nasal washings and aspirates are  unacceptable for Xpert Xpress SARS-CoV-2/FLU/RSV testing.  Fact Sheet for Patients: BloggerCourse.com  Fact Sheet for Healthcare Providers: SeriousBroker.it  This test is not yet approved or cleared by the [place]  FDA and has been authorized for detection and/or diagnosis of SARS-CoV-2 by FDA under an Emergency Use Authorization (EUA). This EUA will remain in effect (meaning this test can be used) for the duration of the COVID-19 declaration under Section 564(b)(1) of the Act, 21 U.S.C. section 360bbb-3(b)(1), unless the authorization is terminated or revoked.     Resp Syncytial Virus by PCR NEGATIVE NEGATIVE Final    Comment: (NOTE) Fact Sheet for Patients: BloggerCourse.com  Fact Sheet for Healthcare Providers: SeriousBroker.it  This test is not yet approved or cleared by the [place]  FDA and has been authorized for detection and/or diagnosis of SARS-CoV-2 by FDA under an Emergency Use Authorization (EUA). This EUA will remain in effect (meaning this test can be used) for the duration of the COVID-19 declaration under Section 564(b)(1) of the Act, 21 U.S.C. section 360bbb-3(b)(1), unless the authorization is terminated or revoked.  Performed at [facility], [address] [place], [place] [address]   Blood Culture (routine x 2)     Status: Abnormal   Collection Time: 10/01/23  9:12 AM   Specimen: BLOOD  Result Value Ref Range Status   Specimen Description   Final    BLOOD BLOOD LEFT FOREARM Performed at [facility], [address] [place], [place] [address]    Special Requests   Final    BOTTLES DRAWN AEROBIC AND ANAEROBIC Blood Culture results may not be optimal due to an inadequate volume of blood received in culture bottles Performed at [facility], [address], [place], [place] [address]    Culture  Setup Time   Final     GRAM NEGATIVE RODS IN BOTH AEROBIC AND ANAEROBIC BOTTLES CRITICAL VALUE NOTED.  VALUE IS CONSISTENT WITH PREVIOUSLY REPORTED AND CALLED VALUE. Performed at [facility], [address] [place], [place] [address]    Culture (A)  Final    SERRATIA MARCESCENS SUSCEPTIBILITIES PERFORMED ON PREVIOUS CULTURE WITHIN THE LAST 5 DAYS. Performed at [facility], [address], [place], [place] [address]    Report Status 10/04/2023 FINAL  Final  Respiratory (~20 pathogens) panel by PCR     Status: None   Collection Time: 10/01/23  9:12 AM   Specimen: Nasopharyngeal Swab; Respiratory  Result Value Ref Range Status   Adenovirus NOT DETECTED NOT DETECTED Final   Coronavirus 229E NOT DETECTED NOT DETECTED Final    Comment: (NOTE) The Coronavirus on the Respiratory Panel, DOES NOT test for the novel  Coronavirus (2019 nCoV)    Coronavirus HKU1 NOT DETECTED NOT DETECTED Final   Coronavirus NL63 NOT DETECTED NOT DETECTED Final   Coronavirus OC43 NOT DETECTED NOT DETECTED Final   Metapneumovirus NOT DETECTED NOT DETECTED Final   Rhinovirus / Enterovirus NOT DETECTED NOT DETECTED Final   Influenza A NOT DETECTED NOT DETECTED Final   Influenza B NOT DETECTED NOT DETECTED  Final   Parainfluenza Virus 1 NOT DETECTED NOT DETECTED Final   Parainfluenza Virus 2 NOT DETECTED NOT DETECTED Final   Parainfluenza Virus 3 NOT DETECTED NOT DETECTED Final   Parainfluenza Virus 4 NOT DETECTED NOT DETECTED Final   Respiratory Syncytial Virus NOT DETECTED NOT DETECTED Final   Bordetella pertussis NOT DETECTED NOT DETECTED Final   Bordetella Parapertussis NOT DETECTED NOT DETECTED Final   Chlamydophila pneumoniae NOT DETECTED NOT DETECTED Final   Mycoplasma pneumoniae NOT DETECTED NOT DETECTED Final    Comment: Performed at [facility], [address], [place], [place] [address]  Blood Culture (routine x 2)     Status: Abnormal   Collection Time: 10/01/23  9:33 AM   Specimen: BLOOD   Result Value Ref Range Status   Specimen Description   Final    BLOOD RIGHT WRIST Performed at [facility], [address], [place], [place] [address]    Special Requests   Final    BOTTLES DRAWN AEROBIC AND ANAEROBIC Blood Culture adequate volume Performed at [facility], [address] [place], [place] [address]    Culture  Setup Time   Final    GRAM NEGATIVE RODS IN BOTH AEROBIC AND ANAEROBIC BOTTLES CRITICAL RESULT CALLED TO, READ BACK BY AND VERIFIED WITH:  NATHAN BELUE AT 0457 10/02/23 JG Performed at [facility], [address] [place], [place] [address]    Culture SERRATIA MARCESCENS (A)  Final   Report Status 10/04/2023 FINAL  Final   Organism ID, Bacteria SERRATIA MARCESCENS  Final      Susceptibility   Serratia marcescens - MIC*    CEFEPIME  <=0.12 SENSITIVE Sensitive     CEFTAZIDIME <=1 SENSITIVE Sensitive     CEFTRIAXONE  <=0.25 SENSITIVE Sensitive     CIPROFLOXACIN  <=0.25 SENSITIVE Sensitive     GENTAMICIN <=1 SENSITIVE Sensitive     TRIMETH /SULFA  <=20 SENSITIVE Sensitive     * SERRATIA MARCESCENS  Blood Culture ID Panel (Reflexed)     Status: Abnormal   Collection Time: 10/01/23  9:33 AM  Result Value Ref Range Status   Enterococcus faecalis NOT DETECTED NOT DETECTED Final   Enterococcus Faecium NOT DETECTED NOT DETECTED Final   Listeria monocytogenes NOT DETECTED NOT DETECTED Final   Staphylococcus species NOT DETECTED NOT DETECTED Final   Staphylococcus aureus (BCID) NOT DETECTED NOT DETECTED Final   Staphylococcus epidermidis NOT DETECTED NOT DETECTED Final   Staphylococcus lugdunensis NOT DETECTED NOT DETECTED Final   Streptococcus species NOT DETECTED NOT DETECTED Final   Streptococcus agalactiae NOT DETECTED NOT DETECTED Final   Streptococcus pneumoniae NOT DETECTED NOT DETECTED Final   Streptococcus pyogenes NOT DETECTED NOT DETECTED Final   A.calcoaceticus-baumannii NOT DETECTED NOT DETECTED Final   Bacteroides fragilis  NOT DETECTED NOT DETECTED Final   Enterobacterales DETECTED (A) NOT DETECTED Final    Comment: Enterobacterales represent a large order of gram negative bacteria, not a single organism. CRITICAL RESULT CALLED TO, READ BACK BY AND VERIFIED WITH:  NATHAN BELUE AT 0457 10/02/23 JG    Enterobacter cloacae complex NOT DETECTED NOT DETECTED Final   Escherichia coli NOT DETECTED NOT DETECTED Final   Klebsiella aerogenes NOT DETECTED NOT DETECTED Final   Klebsiella oxytoca NOT DETECTED NOT DETECTED Final   Klebsiella pneumoniae NOT DETECTED NOT DETECTED Final   Proteus species NOT DETECTED NOT DETECTED Final   Salmonella species NOT DETECTED NOT DETECTED Final   Serratia marcescens DETECTED (A) NOT DETECTED Final    Comment:  CRITICAL RESULT CALLED TO, READ BACK BY AND VERIFIED WITH:  NATHAN BELUE AT 1610 10/02/23 JG    Haemophilus influenzae NOT DETECTED NOT DETECTED Final   Neisseria meningitidis NOT DETECTED NOT DETECTED Final   Pseudomonas aeruginosa NOT DETECTED NOT DETECTED Final   Stenotrophomonas maltophilia NOT DETECTED NOT DETECTED Final   Candida albicans NOT DETECTED NOT DETECTED Final   Candida auris NOT DETECTED NOT DETECTED Final   Candida glabrata NOT DETECTED NOT DETECTED Final   Candida krusei NOT DETECTED NOT DETECTED Final   Candida parapsilosis NOT DETECTED NOT DETECTED Final   Candida tropicalis NOT DETECTED NOT DETECTED Final   Cryptococcus neoformans/gattii NOT DETECTED NOT DETECTED Final   CTX-M ESBL NOT DETECTED NOT DETECTED Final   Carbapenem resistance IMP NOT DETECTED NOT DETECTED Final   Carbapenem resistance KPC NOT DETECTED NOT DETECTED Final   Carbapenem resistance NDM NOT DETECTED NOT DETECTED Final   Carbapenem resist OXA 48 LIKE NOT DETECTED NOT DETECTED Final   Carbapenem resistance VIM NOT DETECTED NOT DETECTED Final    Comment: Performed at [facility], [address] [place], [place] [address]  MRSA Next Gen by PCR, Nasal     Status: None    Collection Time: 10/02/23  5:26 PM   Specimen: Nasal Mucosa; Nasal Swab  Result Value Ref Range Status   MRSA by PCR Next Gen NOT DETECTED NOT DETECTED Final    Comment: (NOTE) The GeneXpert MRSA Assay (FDA approved for NASAL specimens only), is one component of a comprehensive MRSA colonization surveillance program. It is not intended to diagnose MRSA infection nor to guide or monitor treatment for MRSA infections. Test performance is not FDA approved in patients less than 43 years old. Performed at [facility], [address] [place], [place] [address]   Cath Tip Culture     Status: Abnormal (Preliminary result)   Collection Time: 10/02/23  9:28 PM   Specimen: Catheter Tip; Other  Result Value Ref Range Status   Specimen Description   Final    CATH TIP Performed at [facility], [address], [place], [place] [address]    Special Requests   Final    NONE Performed at [facility], [address], [place], [place] [address]    Culture (A)  Final    SERRATIA MARCESCENS SUSCEPTIBILITIES TO FOLLOW Performed at [facility], [address] [place], [place] [address]    Report Status PENDING  Incomplete  Culture, blood (Routine X 2) w Reflex to ID Panel     Status: None (Preliminary result)   Collection Time: 10/03/23  1:00 PM   Specimen: BLOOD  Result Value Ref Range Status   Specimen Description BLOOD BLOOD LEFT WRIST  Final   Special Requests   Final    BOTTLES DRAWN AEROBIC ONLY Blood Culture adequate volume   Culture   Final    NO GROWTH 4 DAYS Performed at [facility], [address], [place], [place] [address]    Report Status PENDING  Incomplete  Culture, blood (Routine X 2) w Reflex to ID Panel     Status: None (Preliminary result)   Collection Time: 10/03/23  1:04 PM   Specimen: BLOOD  Result Value Ref Range Status   Specimen Description BLOOD BLOOD LEFT FOREARM  Final   Special Requests   Final    BOTTLES  DRAWN AEROBIC ONLY Blood Culture adequate volume   Culture   Final    NO GROWTH  4 DAYS Performed at [facility], [address] [place], [place] [address]    Report Status PENDING  Incomplete      Radiology Studies last 3 days: PERIPHERAL VASCULAR CATHETERIZATION Result Date: 10/05/2023 See surgical note for result.  CARDIAC CATHETERIZATION Result Date: 10/04/2023 Images from the original result were not included.   Prox RCA to Mid RCA lesion is 90% stenosed.  Mid RCA lesion is 80% stenosed.Mid RCA lesion is 80% stenosed.   Non-stenotic Ost LM to Dist LM lesion.   Heavily calcified left coronary artery system was not able to be engaged due to inability to advance catheter to the left coronary cusp.   Not able to cross the aortic valve for measurement of the hemodynamics. Dominance: Right Heavily calcified somewhat tortuous RCA was difficult to engage: Prox RCA to Mid RCA lesion is 90% stenosed. Mid RCA lesion is 80% stenosed.Mid RCA lesion is 80% stenosed. Heavily calcified left coronary artery system was not able to be engaged due to inability to advance catheter to the left coronary cusp. Unable to engage the left coronary artery as the catheters were not advancing beyond the innominate artery and aortic knob. There is extreme tortuosity of the innominate artery and heavy calcifications not allowing the catheter be pushed freely through the artery and into the forearm or brachial aspect of the radial artery and worse around the tortuous innominate. RECOMMENDATIONS   Anticipated discharge date to be determined.   Recommend Aspirin  81mg  daily for moderate CAD.   Based on the presence of severe coronary disease at least in the one-vessel seen and heavy calcification in the left coronary system would strongly consider antiplatelet therapy with at a minimum aspirin  but even potentially Plavix based on elevated troponin levels.  No obvious invasive options for CAD management given lack of  viable access. Randene Bustard, MD       Time spent: 50 min     Melodi Sprung, DO [facility] 10/07/2023, 2:54 PM    Dictation software may have been used to generate the above note. Typos may occur and escape review in typed/dictated notes. Please contact Dr Authur Leghorn directly for clarity if needed.  Staff may message me via secure chat in Epic  but this may not receive an immediate response,  please page me for urgent matters!  If 7PM-7AM, please contact night coverage [email or web address]

## 2023-10-07 NOTE — Progress Notes (Signed)
 Occupational Therapy Evaluation Patient Details Name: Amy Wu MRN: [id number] DOB: 1951-03-14 Today's Date: 10/07/2023   History of Present Illness   Pt is a 73 y.o. female presenting to hospital 10/01/23 with c/o increasing somnolence; pt noted to be hypoxic.  Pt admitted with sepsis, acute respiratory failure with hypoxia, PNA, encephalopathy, ESRD on dialysis.  S/p L heart cath 10/04/23; L femoral vein tunneled HD catheter placement 10/05/23.  PMH includes ESRD on HD MWF, DM, HLD, PVD.     Clinical Impressions Pt was seen for OT/PT evaluation this date. Prior to hospital admission, pt was dependent on her husband and home health aid to assist her in all BADL/IADLs. Pt primarily uses the hoyer lift for transfers but also uses a sliding board when feeling strong/depending on the context of the transfer. Pt currently requires CGA - MINA to roll in bed; pt declined further activity including sitting on EOB/ and lateral scoot transfers due to reported level of fatigue. Pt oriented to name and place only, Husband at bed side. Pt set up with tooth brush and tooth paste to use when she has more energy to complete. Pt would benefit from skilled OT services to address noted impairments and functional limitations (see below for any additional details) in order to maximize safety and independence while minimizing falls risk and caregiver burden. OT will follow acutely.     If plan is discharge home, recommend the following:   Two people to help with walking and/or transfers;A lot of help with bathing/dressing/bathroom;Assistance with cooking/housework;Assist for transportation;Direct supervision/assist for medications management;Direct supervision/assist for financial management;Supervision due to cognitive status     Functional Status Assessment   Patient has had a recent decline in their functional status and demonstrates the ability to make significant improvements in function in a reasonable  and predictable amount of time.     Equipment Recommendations   Hospital bed     Recommendations for Other Services         Precautions/Restrictions   Precautions Precautions: Fall Recall of Precautions/Restrictions: Intact Restrictions Weight Bearing Restrictions Per Provider Order: No Other Position/Activity Restrictions: R AKA; L BKA     Mobility Bed Mobility Overal bed mobility: Needs Assistance Bed Mobility: Rolling Rolling: Min assist         General bed mobility comments: 2x rolling attempts; initially unable to logroll fully to L side but able on own on 2nd attempt with increased effort and use of momentum; min assist for hips to logroll to R side with increased effort/time    Transfers                   General transfer comment: Pt refused to sit on EOB or attempt lateral scoot transfers on this date due to levels of fatigue      Balance                                           ADL either performed or assessed with clinical judgement   ADL Overall ADL's : Needs assistance/impaired                                       General ADL Comments: Limited ADL participation on this date due to pt's stated level of weakness and lethargy  Pertinent Vitals/Pain Pain Assessment Pain Assessment: No/denies pain     Extremity/Trunk Assessment Upper Extremity Assessment Upper Extremity Assessment: Generalized weakness   Lower Extremity Assessment Lower Extremity Assessment: RLE deficits/detail;LLE deficits/detail;Defer to PT evaluation RLE Deficits / Details: R AKA LLE Deficits / Details: L BKA       Communication Communication Communication: No apparent difficulties Factors Affecting Communication: Difficulty expressing self   Cognition Arousal: Lethargic Behavior During Therapy: WFL for tasks assessed/performed Cognition: Cognition impaired   Orientation impairments: Situation, Time   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   Executive functioning impairment (select all impairments): Problem solving, Reasoning OT - Cognition Comments: Very tried, initally effortful to open eyes but as we continued talking she became more awake                 Following commands: Impaired Following commands impaired: Follows one step commands with increased time     Cueing  General Comments   Cueing Techniques: Verbal cues;Visual cues  Husband at beside   Exercises Exercises: Other exercises Other Exercises Other Exercises: Edu: Role of OT, benefits of rolling for pressure relief, transfer techniques safe ADL completions   Shoulder Instructions      Home Living Family/patient expects to be discharged to:: Private residence Living Arrangements: Spouse/significant other Available Help at Discharge: Personal care attendant;Friend(s);Other (Comment) Type of Home: House Home Access: Ramped entrance     Home Layout: One level     Bathroom Shower/Tub: Producer, television/film/video: Standard     Home Equipment: Tub bench;Grab bars - toilet;Other (comment);Wheelchair - power;Wheelchair - manual;Grab bars - tub/shower   Additional Comments: Uses the hoyer lift or sliding board at baseline for transfers      Prior Functioning/Environment Prior Level of Function : Needs assist             Mobility Comments: [facility] van for HD appointments; uses manual w/c or power w/c depending on situation; will use slideboard vs hoyer lift to get in/out of car ADLs Comments: Husband assist with all ADLs/IADLs, uses birdbaths mostly, bed pans at baseline. Aid comes to house to assist with grooming, dressings, some home management (3x/week)    OT Problem List: Decreased strength;Decreased activity tolerance;Impaired balance (sitting and/or standing);Decreased coordination;Decreased cognition;Decreased safety awareness;Decreased knowledge of use of DME or  AE   OT Treatment/Interventions: Patient/family education;Therapeutic activities;DME and/or AE instruction;Energy conservation;Therapeutic exercise;Self-care/ADL training      OT Goals(Current goals can be found in the care plan section)   Acute Rehab OT Goals Patient Stated Goal: Get stronger OT Goal Formulation: With patient Time For Goal Achievement: 10/21/23 Potential to Achieve Goals: Fair ADL Goals Pt Will Perform Grooming: with mod assist;sitting Pt Will Perform Lower Body Bathing: with mod assist Pt Will Transfer to Toilet: with max assist (Via Sliding board)   OT Frequency:  Min 1X/week    Co-evaluation PT/OT/SLP Co-Evaluation/Treatment: Yes Reason for Co-Treatment: To address functional/ADL transfers PT goals addressed during session: Mobility/safety with mobility OT goals addressed during session: ADL's and self-care      AM-PAC OT "6 Clicks" Daily Activity     Outcome Measure Help from another person eating meals?: A Little Help from another person taking care of personal grooming?: A Lot Help from another person toileting, which includes using toliet, bedpan, or urinal?: Total Help from another person bathing (including washing, rinsing, drying)?: A Lot Help from another person to put on and taking off regular upper body clothing?: A Lot Help  from another person to put on and taking off regular lower body clothing?: Total 6 Click Score: 11   End of Session Equipment Utilized During Treatment: Oxygen Nurse Communication: Mobility status  Activity Tolerance: Patient limited by lethargy Patient left: in bed;with bed alarm set;with call bell/phone within reach;with family/visitor present  OT Visit Diagnosis: Other abnormalities of gait and mobility (R26.89);Muscle weakness (generalized) (M62.81)                Time: 4098-1191 OT Time Calculation (min): 24 min Charges:  OT General Charges $OT Visit: 1 Visit OT Evaluation $OT Eval Moderate Complexity: 1  Mod  Elize Pinon M.S. OTR/L  10/07/23, 5:03 PM

## 2023-10-07 NOTE — Progress Notes (Signed)
 Cardiology Progress Note   Patient Name: Amy Wu Date of Encounter: 10/07/2023  Primary Cardiologist: Timothy Gollan, MD  Subjective   Tolerated HD well yesterday.  Noted to be atrial flutter in the AM (ECG on hard copy of chart).  Converted to sinus while in HD, later in the afternoon.  In sinus this AM.  Asymptomatic when in flutter.  Feels well this AM.  No c/p, dyspnea. Intermittent confusion. Objective   Inpatient Medications    Scheduled Meds:  amiodarone   200 mg Oral BID   aspirin  EC  81 mg Oral Daily   atorvastatin   80 mg Oral Daily   Chlorhexidine  Gluconate Cloth  6 each Topical Q0600   insulin  aspart  0-15 Units Subcutaneous TID WC   insulin  aspart  0-5 Units Subcutaneous QHS   insulin  aspart  5 Units Subcutaneous TID WC   Continuous Infusions:  ceFEPime  (MAXIPIME ) IV 1 g (10/06/23 2239)   heparin  1,250 Units/hr (10/06/23 1808)   PRN Meds: acetaminophen , gabapentin , Gerhardt's butt cream, ondansetron  **OR** ondansetron  (ZOFRAN ) IV   Vital Signs    Vitals:   10/07/23 0342 10/07/23 0344 10/07/23 0453 10/07/23 0727  BP:    139/66  Pulse:    69  Resp: 14   18  Temp:    98.6 F (37 C)  TempSrc:    Oral  SpO2:  100%  100%  Weight:   96.8 kg   Height:        Intake/Output Summary (Last 24 hours) at 10/07/2023 1126 Last data filed at 10/07/2023 0900 Gross per 24 hour  Intake 360 ml  Output 2000 ml  Net -1640 ml   Filed Weights   10/06/23 1256 10/06/23 1705 10/07/23 0453  Weight: 97.4 kg 96.4 kg 96.8 kg    Physical Exam   GEN: Obese, well developed, in no acute distress.  HEENT: Grossly normal.  Neck: Supple, obese, difficult to gauge JVP, no carotid bruits, or masses. Cardiac: RRR, no murmurs, rubs, or gallops. No clubbing, cyanosis, mild thigh edema.  Bilateral AKAs  Respiratory:  Respirations regular and unlabored, clear to auscultation bilaterally. GI: Soft, nontender, nondistended, BS + x 4. MS: no deformity or atrophy. Skin: warm and dry,  no rash. Neuro:  Strength and sensation are intact. Psych: AAOx3.  Normal affect.  Intermittently confused.  Labs    Chemistry Recent Labs  Lab 10/01/23 0912 10/02/23 0600 10/03/23 0222 10/05/23 0556 10/06/23 0511 10/07/23 0153  NA 138 133*   < > 133* 132* 134*  K 3.0* 3.5   < > 4.4 4.1 3.5  CL 101 98   < > 98 97* 96*  CO2 23 24   < > 21* 21* 24  GLUCOSE 180* 147*   < > 318* 414* 288*  BUN 31* 23   < > 52* 60* 40*  CREATININE 7.42* 5.13*   < > 8.34* 9.06* 6.13*  CALCIUM  8.6* 8.6*   < > 8.6* 8.7* 8.8*  PROT 6.7 6.3*  --   --   --   --   ALBUMIN 3.1* 2.9*  --   --   --   --   AST 25 81*  --   --   --   --   ALT 7 16  --   --   --   --   ALKPHOS 70 68  --   --   --   --   BILITOT 0.9 1.0  --   --   --   --  GFRNONAA 5* 8*   < > 5* 4* 7*  ANIONGAP 14 11   < > 14 14 14    < > = values in this interval not displayed.     Hematology Recent Labs  Lab 10/05/23 0556 10/06/23 0511 10/07/23 0153  WBC 7.4 10.7* 11.1*  RBC 4.39 4.13 4.20  HGB 11.1* 10.6* 10.9*  HCT 33.4* 30.9* 32.5*  MCV 76.1* 74.8* 77.4*  MCH 25.3* 25.7* 26.0  MCHC 33.2 34.3 33.5  RDW 17.5* 17.0* 17.2*  PLT 137* 181 159    Cardiac Enzymes  Recent Labs  Lab 10/03/23 0222 10/03/23 0421 10/03/23 0935  TROPONINIHS 6,721* 6,948* 5,122*      BNP    Component Value Date/Time   BNP 1,436.1 (H) 10/01/2023 0912   Lipids  Lab Results  Component Value Date   CHOL 90 10/03/2023   HDL 16 (L) 10/03/2023   LDLCALC 44 10/03/2023   TRIG 148 10/03/2023   CHOLHDL 5.6 10/03/2023    HbA1c  Lab Results  Component Value Date   HGBA1C 6.5 (H) 10/01/2023    Radiology    ------------    Telemetry    Converted to sinus rhythm on afternoon of 4/19 while in HD.  Maintaining sinus rhythm this AM, occas PACs - Personally Reviewed  Cardiac Studies   2D Echocardiogram 4.14.2025   1. Left ventricular ejection fraction, by estimation, is >55%. The left  ventricle has normal function. Left ventricular  endocardial border not  optimally defined to evaluate regional wall motion. There is mild left  ventricular hypertrophy. Left  ventricular diastolic parameters are indeterminate.   2. Right ventricular systolic function is low normal. The right  ventricular size is normal.   3. The mitral valve is normal in structure. No evidence of mitral valve  regurgitation. No evidence of mitral stenosis.   4. The aortic valve is tricuspid. There is moderate thickening of the  aortic valve. Aortic valve regurgitation is not visualized. Aortic valve  sclerosis is present, with no evidence of aortic valve stenosis.  _____________    Cardiac Catheterization  4.17.2025   Dominance: Right    Heavily calcified somewhat tortuous RCA was difficult to engage: Prox RCA to Mid RCA lesion is 90% stenosed. Mid RCA lesion is 80% stenosed.Mid RCA lesion is 80% stenosed.  Heavily calcified left coronary artery system was not able to be engaged due to inability to advance catheter to the left coronary cusp. Unable to engage the left coronary artery as the catheters were not advancing beyond the innominate artery and aortic knob. There is extreme tortuosity of the innominate artery and heavy calcifications not allowing the catheter be pushed freely through the artery and into the forearm or brachial aspect of the radial artery and worse around the tortuous innominate.    RECOMMENDATIONS   Anticipated discharge date to be determined.   Recommend Aspirin  81mg  daily for moderate CAD.   Based on the presence of severe coronary disease at least in the one-vessel seen and heavy calcification in the left coronary system would strongly consider antiplatelet therapy with at a minimum aspirin  but even potentially Plavix based on elevated troponin levels.   No obvious invasive options for CAD management given lack of viable access.   _____________   Patient Profile      73 y.o. female with history of end-stage renal disease on  hemodialysis, diabetes, hyperlipidemia, PAD status post bilateral AKA, and obesity, who was admitted April 15 with sepsis, bacteremia, hypotension, and non-STEMI  with peak troponin of 6948.   Assessment & Plan    1.  Non-STEMI/CAD:  Patient admitted April 15 with bacteremia, sepsis, fever, hypoxia, tachycardia, and hypotension initially requiring vasopressor therapy. At time she had bradycardia in the 30s to 50s following a dose of Zanaflex . Troponin peaked at 6948. Echo shows normal LV function.  Diagnostic catheterization on April 17 complicated by difficult vascular anatomy and inability to engage the left coronary tree with catheters.  The right coronary artery has severe mid and distal disease.  On fluoroscopy, the left coronary tree was significantly calcified, though the interventional team was unable to engage and therefore unable to obtain imaging of flow.  Conservative medical therapy has been recommended with the presumption that she has significant multivessel disease.  Cont to deny chest pain/dyspnea.  Will continue aspirin  and statin therapy.  Beta-blocker previously avoided in the setting of hypotension requiring midodrine  earlier during hospitalization.  Pressures have since been relatively stable - would likely tolerate at this time.  See below re: atrial flutter.  Can d/c ASA once she is on OAC.   2.  Atrial flutter: Noted on telemetry on 4/19.  Rate-controlled aflutter from just after 9 AM until later afternoon on 4/19 - converted in HD.  Has since maintained sinus rhythm.  She was asymptomatic.  Amio started 4/19.  We will plan to continue amiodarone  200 BID for the next two wks and then look to reduce to 200 daily, as she would likely do poorly in persistent flutter.  She is currently on heparin .  Once it is clear that no additional invasive procedures are pending, rec transitioning to eliquis  5 BID - CHA2DS2-VASc equals at least 4.      3.  Septic shock/Serratia bacteremia: Felt to be  secondary to HD catheter which has since been removed with temporary catheter replaced.  Hemodynamically stable and no longer requiring midodrine .  Antibiotics per infectious disease.   4.  End-stage renal disease: Hemodialysis managed by nephrology.  Tolerated well yesterday.   Signed, [name], NP  10/07/2023, 11:26 AM    For questions or updates, please contact   Please consult [email or web address] for contact info under Cardiology/STEMI.

## 2023-10-07 NOTE — Consult Note (Signed)
 PHARMACY - ANTICOAGULATION CONSULT NOTE  Pharmacy Consult for heparin  infusion Indication: atrial fibrillation  Allergies  Allergen Reactions   Ancef  [Cefazolin ] Palpitations   Contrast Media [Iodinated Contrast Media] Palpitations    Patient Measurements: Height: 4\' 9"  (144.8 cm) Weight: 96.4 kg (212 lb 8.4 oz) IBW/kg (Calculated) : 38.6 HDW: 65kg  Vital Signs: Temp: 97 F (36.1 C) (04/20 0217) Temp Source: Oral (04/19 1748) BP: 136/60 (04/20 0240) Pulse Rate: 84 (04/20 0240)  Labs: Recent Labs    10/04/23 0441 10/05/23 0556 10/06/23 0511 10/06/23 2103 10/07/23 0153  HGB 10.3* 11.1* 10.6*  --  10.9*  HCT 30.4* 33.4* 30.9*  --  32.5*  PLT 115* 137* 181  --  159  APTT  --   --   --  158*  --   LABPROT  --   --   --  15.7*  --   INR  --   --   --  1.2  --   HEPARINUNFRC 0.18* 0.43  --   --  0.49  CREATININE 7.42* 8.34* 9.06*  --  6.13*    Estimated Creatinine Clearance: 8.1 mL/min (A) (by C-G formula based on SCr of 6.13 mg/dL (H)).   Medical History: Past Medical History:  Diagnosis Date   Arthritis    Chronic kidney disease    Complication of anesthesia    Diabetes mellitus without complication (HCC)    Hyperlipidemia    Peripheral vascular disease (HCC)     Medications:  Patient has on heparin  infusion 4/16 to 4/18 for possible ACS  Assessment: 73 yo female being treated for [facility] bacteremia.  Patient was had previously been on heparin  for ACS this was then stopped after 48 hours.  Now patient found to be in Aflutter and heparin  to be resumed.  aPTT and PT/INR ordered  Goal of Therapy:  Heparin  level 0.3-0.7 units/ml Monitor platelets by anticoagulation protocol: Yes  04/20 0153 HL 0.49, therapeutic x 1   Plan:  Continue heparin  infusion at 1250 units/hr Check anti-Xa level in 8 hours to confirm Continue to monitor H&H and platelets  Coretta Dexter, PharmD, [facility] 10/07/2023 2:51 AM

## 2023-10-08 ENCOUNTER — Other Ambulatory Visit (HOSPITAL_COMMUNITY): Payer: Self-pay

## 2023-10-08 DIAGNOSIS — N186 End stage renal disease: Secondary | ICD-10-CM | POA: Diagnosis not present

## 2023-10-08 DIAGNOSIS — R7881 Bacteremia: Secondary | ICD-10-CM | POA: Diagnosis not present

## 2023-10-08 DIAGNOSIS — A419 Sepsis, unspecified organism: Secondary | ICD-10-CM | POA: Diagnosis not present

## 2023-10-08 DIAGNOSIS — Z992 Dependence on renal dialysis: Secondary | ICD-10-CM | POA: Diagnosis not present

## 2023-10-08 DIAGNOSIS — G9340 Encephalopathy, unspecified: Secondary | ICD-10-CM | POA: Diagnosis not present

## 2023-10-08 DIAGNOSIS — J9601 Acute respiratory failure with hypoxia: Secondary | ICD-10-CM | POA: Diagnosis not present

## 2023-10-08 LAB — RENAL FUNCTION PANEL
Albumin: 2.5 g/dL — ABNORMAL LOW (ref 3.5–5.0)
Anion gap: 12 (ref 5–15)
BUN: 51 mg/dL — ABNORMAL HIGH (ref 8–23)
CO2: 23 mmol/L (ref 22–32)
Calcium: 8.6 mg/dL — ABNORMAL LOW (ref 8.9–10.3)
Chloride: 98 mmol/L (ref 98–111)
Creatinine, Ser: 7.32 mg/dL — ABNORMAL HIGH (ref 0.44–1.00)
GFR, Estimated: 5 mL/min — ABNORMAL LOW (ref >60)
Glucose, Bld: 155 mg/dL — ABNORMAL HIGH (ref 70–99)
Phosphorus: 3.8 mg/dL (ref 2.5–4.6)
Potassium: 3.2 mmol/L — ABNORMAL LOW (ref 3.5–5.1)
Sodium: 133 mmol/L — ABNORMAL LOW (ref 135–145)

## 2023-10-08 LAB — CBC
HCT: 30.6 % — ABNORMAL LOW (ref 36.0–46.0)
HCT: 30.1 % — ABNORMAL LOW (ref 36.0–46.0)
Hemoglobin: 10.3 g/dL — ABNORMAL LOW (ref 12.0–15.0)
Hemoglobin: 10.1 g/dL — ABNORMAL LOW (ref 12.0–15.0)
MCH: 25.8 pg — ABNORMAL LOW (ref 26.0–34.0)
MCH: 25.8 pg — ABNORMAL LOW (ref 26.0–34.0)
MCHC: 33.7 g/dL (ref 30.0–36.0)
MCHC: 33.6 g/dL (ref 30.0–36.0)
MCV: 76.7 fL — ABNORMAL LOW (ref 80.0–100.0)
MCV: 77 fL — ABNORMAL LOW (ref 80.0–100.0)
Platelets: 164 10*3/uL (ref 150–400)
Platelets: 165 10*3/uL (ref 150–400)
RBC: 3.99 MIL/uL (ref 3.87–5.11)
RBC: 3.91 MIL/uL (ref 3.87–5.11)
RDW: 16.9 % — ABNORMAL HIGH (ref 11.5–15.5)
RDW: 17.2 % — ABNORMAL HIGH (ref 11.5–15.5)
WBC: 12.4 10*3/uL — ABNORMAL HIGH (ref 4.0–10.5)
WBC: 11.8 10*3/uL — ABNORMAL HIGH (ref 4.0–10.5)
nRBC: 0.2 % (ref 0.0–0.2)
nRBC: 0 % (ref 0.0–0.2)

## 2023-10-08 LAB — CATH TIP CULTURE: Culture: >=100000 — AB

## 2023-10-08 LAB — LIPOPROTEIN A (LPA): Lipoprotein (a): 202.8 nmol/L — ABNORMAL HIGH (ref <75.0)

## 2023-10-08 LAB — CULTURE, BLOOD (ROUTINE X 2)
Culture: NO GROWTH
Culture: NO GROWTH
Special Requests: ADEQUATE
Special Requests: ADEQUATE

## 2023-10-08 LAB — HEPARIN LEVEL (UNFRACTIONATED): Heparin Unfractionated: 0.51 [IU]/mL (ref 0.30–0.70)

## 2023-10-08 LAB — GLUCOSE, CAPILLARY
Glucose-Capillary: 205 mg/dL — ABNORMAL HIGH (ref 70–99)
Glucose-Capillary: 168 mg/dL — ABNORMAL HIGH (ref 70–99)

## 2023-10-08 MED ORDER — APIXABAN 5 MG PO TABS
5.0000 mg | ORAL_TABLET | Freq: Two times a day (BID) | ORAL | 0 refills | Status: DC
Start: 2023-10-08 — End: 2023-12-25

## 2023-10-08 MED ORDER — CEFEPIME IV (FOR PTA / DISCHARGE USE ONLY): 2.0000 g | INTRAVENOUS | Status: DC

## 2023-10-08 MED ORDER — SODIUM CHLORIDE 0.9 % IV SOLN
2.0000 g | Freq: Once | INTRAVENOUS | Status: AC
  Administered 2023-10-08: 2 g via INTRAVENOUS
  Filled 2023-10-08: qty 12.5

## 2023-10-08 MED ORDER — HEPARIN SODIUM (PORCINE) 1000 UNIT/ML DIALYSIS: 1000.0000 [IU] | INTRAMUSCULAR | Status: DC | PRN

## 2023-10-08 MED ORDER — SODIUM CHLORIDE 0.9 % IV SOLN: 2.0000 g | INTRAVENOUS | Status: DC

## 2023-10-08 MED ORDER — APIXABAN 5 MG PO TABS
5.0000 mg | ORAL_TABLET | Freq: Two times a day (BID) | ORAL | Status: DC
  Administered 2023-10-08 – 2023-10-12 (×8): 5 mg via ORAL
  Filled 2023-10-08 (×8): qty 1

## 2023-10-08 MED ORDER — ALTEPLASE 2 MG IJ SOLR: 2.0000 mg | Freq: Once | INTRAMUSCULAR | Status: DC | PRN

## 2023-10-08 MED ORDER — HEPARIN SODIUM (PORCINE) 1000 UNIT/ML IJ SOLN
INTRAMUSCULAR | Status: AC
  Filled 2023-10-08: qty 10

## 2023-10-08 MED ORDER — AMIODARONE HCL 200 MG PO TABS: ORAL_TABLET | ORAL | 0 refills | Status: DC

## 2023-10-08 NOTE — Progress Notes (Signed)
 Rounding Note    Patient Name: Amy Wu Date of Encounter: 10/08/2023  [facility] Cardiologist: Belva Boyden, MD   Subjective   Patient is seen in HD. She is drowsy on my exam but arouses to nod yes and no to questions. Denies chest pain and shortness of breath. She remains in sinus rhythm on my exam.   Inpatient Medications    Scheduled Meds:  amiodarone   200 mg Oral BID   aspirin  EC  81 mg Oral Daily   atorvastatin   80 mg Oral Daily   Chlorhexidine  Gluconate Cloth  6 each Topical Q0600   insulin  aspart  0-15 Units Subcutaneous TID WC   insulin  aspart  0-5 Units Subcutaneous QHS   insulin  aspart  5 Units Subcutaneous TID WC   insulin  glargine-yfgn  5 Units Subcutaneous Q2200   Continuous Infusions:  ceFEPime  (MAXIPIME ) IV 1 g (10/07/23 2247)   heparin  1,250 Units/hr (10/06/23 1808)   PRN Meds: acetaminophen , gabapentin , Gerhardt's butt cream, ondansetron  **OR** ondansetron  (ZOFRAN ) IV   Vital Signs    Vitals:   10/08/23 0900 10/08/23 0930 10/08/23 1000 10/08/23 1030  BP: (!) 107/56 (!) 123/56 (!) 97/46 137/64  Pulse: 73 73 76 76  Resp: 16 14 12  (!) 23  Temp:      TempSrc:      SpO2: 98% 95% 94% 98%  Weight:      Height:        Intake/Output Summary (Last 24 hours) at 10/08/2023 1050 Last data filed at 10/07/2023 1902 Gross per 24 hour  Intake 390.04 ml  Output 0 ml  Net 390.04 ml      10/08/2023    8:10 AM 10/08/2023    5:00 AM 10/07/2023    4:53 AM  Last 3 Weights  Weight (lbs) 212 lb 15.4 oz 221 lb 12.5 oz 213 lb 6.5 oz  Weight (kg) 96.6 kg 100.6 kg 96.8 kg      Telemetry    Sinus rhythm - Personally Reviewed  Physical Exam   GEN: No acute distress.   Neck: Difficult to assess JVD Cardiac: RRR, no murmurs, rubs, or gallops.  Respiratory: Clear to auscultation bilaterally. GI: Soft, nontender, non-distended  MS: Bilateral AKA; No edema; No deformity. Neuro:  Nonfocal  Psych: Normal affect   Labs    High Sensitivity  Troponin:   Recent Labs  Lab 10/03/23 0222 10/03/23 0421 10/03/23 0935  TROPONINIHS 6,721* 6,948* 5,122*     Chemistry Recent Labs  Lab 10/02/23 0600 10/03/23 0222 10/04/23 0441 10/05/23 0556 10/06/23 0511 10/07/23 0153 10/08/23 0827  NA 133* 133* 133*   < > 132* 134* 133*  K 3.5 3.3* 3.3*   < > 4.1 3.5 3.2*  CL 98 97* 98   < > 97* 96* 98  CO2 24 21* 23   < > 21* 24 23  GLUCOSE 147* 148* 155*   < > 414* 288* 155*  BUN 23 32* 40*   < > 60* 40* 51*  CREATININE 5.13* 6.23* 7.42*   < > 9.06* 6.13* 7.32*  CALCIUM  8.6* 8.3* 8.2*   < > 8.7* 8.8* 8.6*  MG  --  1.9 2.1  --   --   --   --   PROT 6.3*  --   --   --   --   --   --   ALBUMIN 2.9*  --   --   --   --   --  2.5*  AST  81*  --   --   --   --   --   --   ALT 16  --   --   --   --   --   --   ALKPHOS 68  --   --   --   --   --   --   BILITOT 1.0  --   --   --   --   --   --   GFRNONAA 8* 7* 5*   < > 4* 7* 5*  ANIONGAP 11 15 12    < > 14 14 12    < > = values in this interval not displayed.    Lipids  Recent Labs  Lab 10/03/23 1303  CHOL 90  TRIG 148  HDL 16*  LDLCALC 44  CHOLHDL 5.6    Hematology Recent Labs  Lab 10/07/23 0153 10/08/23 0551 10/08/23 0827  WBC 11.1* 12.4* 11.8*  RBC 4.20 3.99 3.91  HGB 10.9* 10.3* 10.1*  HCT 32.5* 30.6* 30.1*  MCV 77.4* 76.7* 77.0*  MCH 26.0 25.8* 25.8*  MCHC 33.5 33.7 33.6  RDW 17.2* 16.9* 17.2*  PLT 159 164 165   Thyroid No results for input(s): "TSH", "FREET4" in the last 168 hours.  BNPNo results for input(s): "BNP", "PROBNP" in the last 168 hours.  DDimer No results for input(s): "DDIMER" in the last 168 hours.   Radiology    No results found.  Cardiac Studies   2D Echocardiogram 4.14.2025   1. Left ventricular ejection fraction, by estimation, is >55%. The left  ventricle has normal function. Left ventricular endocardial border not  optimally defined to evaluate regional wall motion. There is mild left  ventricular hypertrophy. Left  ventricular diastolic  parameters are indeterminate.   2. Right ventricular systolic function is low normal. The right  ventricular size is normal.   3. The mitral valve is normal in structure. No evidence of mitral valve  regurgitation. No evidence of mitral stenosis.   4. The aortic valve is tricuspid. There is moderate thickening of the  aortic valve. Aortic valve regurgitation is not visualized. Aortic valve  sclerosis is present, with no evidence of aortic valve stenosis.  _____________    Cardiac Catheterization  4.17.2025   Dominance: Right    Heavily calcified somewhat tortuous RCA was difficult to engage: Prox RCA to Mid RCA lesion is 90% stenosed. Mid RCA lesion is 80% stenosed.Mid RCA lesion is 80% stenosed.  Heavily calcified left coronary artery system was not able to be engaged due to inability to advance catheter to the left coronary cusp. Unable to engage the left coronary artery as the catheters were not advancing beyond the innominate artery and aortic knob. There is extreme tortuosity of the innominate artery and heavy calcifications not allowing the catheter be pushed freely through the artery and into the forearm or brachial aspect of the radial artery and worse around the tortuous innominate.    RECOMMENDATIONS   Anticipated discharge date to be determined.   Recommend Aspirin  81mg  daily for moderate CAD.   Based on the presence of severe coronary disease at least in the one-vessel seen and heavy calcification in the left coronary system would strongly consider antiplatelet therapy with at a minimum aspirin  but even potentially Plavix based on elevated troponin levels.   No obvious invasive options for CAD management given lack of viable access.   _____________  Patient Profile      73 y.o. female with history of  end-stage renal disease on hemodialysis, diabetes, hyperlipidemia, PAD status post bilateral AKA, and obesity, who was admitted April 15 with sepsis, bacteremia, hypotension, and  non-STEMI with peak troponin of 6948.   Assessment & Plan    NSTEMI CAD - Patient admitted 4/15 with bacteremia, sepsis, fever, hypoxia, tachycardia, and hypotension initially requiring vasopressor therapy - Troponin peaked at 6948  - Echo with normal LV function - LHC 4/17 complicated by difficult vascular anatomy and inability to engage the left coronary tree with catheters.  The right coronary artery has severe mid and distal disease.  On fluoroscopy, the left coronary tree was significantly calcified, though the interventional team was unable to engage and therefore unable to obtain imaging of flow. - Recommend conservative medical therapy with the presumption that patient has significant multivessel disease - Denies chest pain and shortness of breath - Continue aspirin  and statin (d/c ASA once she starts DOAC) - No beta-blocker in the setting of hypotension requiring vasopressors and midodrine  earlier in hospitalization. BP labile but low at times, will continue to defer at this time. Can consider as OP.   Atrial flutter - Noted on telemetry 4/19, rate controlled A-flutter from ~9 AM until later afternoon 4/19, converted in hemodialysis. Asymptomatic during this episode. - Has maintained sinus rhythm since - Continue amiodarone  200 mg twice daily for 2 weeks followed by 200 mg daily thereafter - CHA2DS2-VASc at least 4 - Remains on IV heparin , plan to transition to Eliquis  5 mg twice daily prior to discharge once it is clear no additional invasive procedures are pending, per IM  Septic shock Serratia bacteremia - Felt to be secondary to HD catheter which has since been removed with temporary catheter replaced - Hemodynamically stable, no longer requiring midodrine  - Antibiotics per ID  ESRD - Hemodialysis per nephrology  For questions or updates, please contact [facility] Please consult [email or web address] for contact info under        Signed, Brodie Cannon, PA-C   10/08/2023, 10:50 AM

## 2023-10-08 NOTE — Consult Note (Signed)
 PHARMACY - ANTICOAGULATION CONSULT NOTE  Pharmacy Consult for heparin  infusion Indication: atrial fibrillation  Allergies  Allergen Reactions   Ancef  [Cefazolin ] Palpitations   Contrast Media [Iodinated Contrast Media] Palpitations    Patient Measurements: Height: 4\' 9"  (144.8 cm) Weight: 100.6 kg (221 lb 12.5 oz) IBW/kg (Calculated) : 38.6 HDW: 65kg  Vital Signs: Temp: 98.5 F (36.9 C) (04/21 0450) Temp Source: Oral (04/20 2016) BP: 94/85 (04/21 0450) Pulse Rate: 65 (04/21 0450)  Labs: Recent Labs    10/06/23 0511 10/06/23 2103 10/07/23 0153 10/07/23 1014 10/08/23 0551  HGB 10.6*  --  10.9*  --  10.3*  HCT 30.9*  --  32.5*  --  30.6*  PLT 181  --  159  --  164  APTT  --  158*  --   --   --   LABPROT  --  15.7*  --   --   --   INR  --  1.2  --   --   --   HEPARINUNFRC  --   --  0.49 0.40 0.51  CREATININE 9.06*  --  6.13*  --   --     Estimated Creatinine Clearance: 8.3 mL/min (A) (by C-G formula based on SCr of 6.13 mg/dL (H)).   Medical History: Past Medical History:  Diagnosis Date   Arthritis    Chronic kidney disease    Complication of anesthesia    Diabetes mellitus without complication (HCC)    Hyperlipidemia    Peripheral vascular disease (HCC)     Medications:  Patient has on heparin  infusion 4/16 to 4/18 for possible ACS  Assessment: 73 yo female being treated for [facility] bacteremia.  Patient was had previously been on heparin  for ACS this was then stopped after 48 hours.  Now patient found to be in Aflutter and heparin  to be resumed.  aPTT and PT/INR ordered  Goal of Therapy:  Heparin  level 0.3-0.7 units/ml Monitor platelets by anticoagulation protocol: Yes  04/20 0153 HL 0.49, therapeutic x 1 04/20 1014 HL 0.40, therapeutic x 2 04/21 0551 HL 0.51, therapeutic x 3   Plan:  Continue heparin  infusion at 1250 units/hr Check anti-Xa level tomorrow with morning labs Continue to monitor H&H and platelets  Coretta Dexter, PharmD,  [facility] 10/08/2023 6:40 AM

## 2023-10-08 NOTE — Progress Notes (Incomplete)
 Hemodialysis Note:  Received patient in bed to unit. Alert and oriented. Informed consent singed and in chart.  Treatment initiated: 0844 Treatment completed: ***  Access used: Left Femoral catheter Access issues: ***  Patient tolerated well. Transported back to room, alert without acute distress. Report given to patient's RN.  Total UF removed: *** Medications given: None  Post HD weight: ***  [facility]

## 2023-10-08 NOTE — Progress Notes (Signed)
 Hemodialysis Note:  Received patient in bed to unit. Alert and oriented. Informed consent singed and in chart.  Treatment initiated: 0844 Treatment completed: 1327  Access used: Left femoral catheter Access issues: Low arterial pressure.Rinse back 58 minutes early due to system clot. Nephrologist made aware.  Transported back to room, alert without acute distress. Report given to patient's RN.  Total UF removed: 300 ml Medications given: None  Post HD weight: 96.3 Kg  [facility]

## 2023-10-08 NOTE — Progress Notes (Signed)
 Progress Note    10/08/2023 12:03 PM 3 Days Post-Op  Subjective:  Amy Wu is a 73 yo female now POD #3 from Dialysis Perma catheter placement to her left femoral vein.  No complications to note.  Patient is resting comfortably.  She endorses some soreness around the insertion site but otherwise very tolerable.  Patient endorses catheter is working well for hemodialysis at this time.  No complaints overnight.  Vitals are remained stable.  Review of Systems  Constitutional:  Constitutional negative. Respiratory: Respiratory negative.  Cardiovascular: Cardiovascular negative.  GI: Gastrointestinal negative.  GU: Genitourinary negative. Musculoskeletal: Musculoskeletal negative.  Neurological: Neurological negative. Psychiatric: Psychiatric negative.  All other systems reviewed and are negative  Vitals:   10/08/23 1115 10/08/23 1130  BP: (!) 103/54 (!) 140/58  Pulse: 80 77  Resp: 16 15  Temp:    SpO2: 98% 99%   Physical Exam: Cardiac:  RRR, normal S1 and S2.  No rubs clicks gallops or murmurs. Lungs: Nonlabored breathing, clear throughout but diminished in the bases on auscultation.  Without rales rhonchi or wheezing. Incisions: Left femoral vein insertion site.  Dressings clean dry and intact no hematoma seroma to note. Extremities: Bilateral AKA's.  All extremities warm to touch. Abdomen: Bowel sounds throughout, soft, nontender nondistended.  Overly obese. Neurologic: Oriented x 3, neurologically intact.  CBC    Component Value Date/Time   WBC 11.8 (H) 10/08/2023 0827   RBC 3.91 10/08/2023 0827   HGB 10.1 (L) 10/08/2023 0827   HGB 12.4 12/03/2012 1414   HCT 30.1 (L) 10/08/2023 0827   HCT 38.0 12/03/2012 1414   PLT 165 10/08/2023 0827   PLT 193 12/03/2012 1414   MCV 77.0 (L) 10/08/2023 0827   MCV 81 12/03/2012 1414   MCH 25.8 (L) 10/08/2023 0827   MCHC 33.6 10/08/2023 0827   RDW 17.2 (H) 10/08/2023 0827   RDW 17.7 (H) 12/03/2012 1414   LYMPHSABS 0.7 10/01/2023  0912   MONOABS 0.8 10/01/2023 0912   EOSABS 0.0 10/01/2023 0912   BASOSABS 0.0 10/01/2023 0912    BMET    Component Value Date/Time   NA 133 (L) 10/08/2023 0827   NA 138 12/03/2012 1414   K 3.2 (L) 10/08/2023 0827   K 4.5 12/03/2012 1414   CL 98 10/08/2023 0827   CL 101 12/03/2012 1414   CO2 23 10/08/2023 0827   CO2 27 12/03/2012 1414   GLUCOSE 155 (H) 10/08/2023 0827   GLUCOSE 326 (H) 12/03/2012 1414   BUN 51 (H) 10/08/2023 0827   BUN 37 (H) 12/03/2012 1414   CREATININE 7.32 (H) 10/08/2023 0827   CREATININE 7.04 (H) 12/03/2012 1414   CALCIUM  8.6 (L) 10/08/2023 0827   CALCIUM  8.4 (L) 12/03/2012 1414   GFRNONAA 5 (L) 10/08/2023 0827   GFRNONAA 6 (L) 12/03/2012 1414   GFRAA 9 (L) 11/19/2019 1002   GFRAA 7 (L) 12/03/2012 1414    INR    Component Value Date/Time   INR 1.2 10/06/2023 2103   INR 1.0 07/26/2011 1643     Intake/Output Summary (Last 24 hours) at 10/08/2023 1203 Last data filed at 10/07/2023 1902 Gross per 24 hour  Intake 390.04 ml  Output 0 ml  Net 390.04 ml     Assessment/Plan:  73 y.o. female is s/p placement dialysis permacatheter to left femoral vein.  3 Days Post-Op   Plan Per vascular surgery okay to use catheter for hemodialysis. Pain medication as needed Okay to discharge with dialysis primary catheter in place working  well as soon as patient's medically stable.  DVT prophylaxis: Heparin  with dialysis.   [name] 10/08/2023 12:03 PM

## 2023-10-08 NOTE — TOC Progression Note (Signed)
 Transition of Care [facility]) - Progression Note    Patient Details  Name: MEHEK GREGA MRN: [id number] Date of Birth: 03/03/1951  Transition of Care [facility] CM/SW Contact  Arminda Landmark, RN Phone Number: 10/08/2023, 4:25 PM  Clinical Narrative:    Spoke with Corbin Dess at [facility] [phone]) and they will transport pt home when Dc'd. They prefer to transport prior to 1300. Per chart review pt should be discharged tomorrow. TOC to continue to follow.         Expected Discharge Plan and Services                                               Social Determinants of Health (SDOH) Interventions SDOH Screenings   Food Insecurity: No Food Insecurity (10/01/2023)  Housing: Low Risk  (10/01/2023)  Transportation Needs: No Transportation Needs (10/01/2023)  Utilities: Not At Risk (10/01/2023)  Social Connections: Moderately Isolated (10/01/2023)  Tobacco Use: Low Risk  (10/01/2023)    Readmission Risk Interventions    05/26/2021    1:38 PM 05/24/2021    3:35 PM  Readmission Risk Prevention Plan  Transportation Screening Complete Complete  PCP or Specialist Appt within 3-5 Days Complete Complete  HRI or Home Care Consult  Complete  Social Work Consult for Recovery Care Planning/Counseling Complete   Palliative Care Screening Not Applicable Not Applicable  Medication Review Oceanographer) Complete Complete

## 2023-10-08 NOTE — Progress Notes (Signed)
 PHARMACY CONSULT NOTE FOR:  OUTPATIENT  PARENTERAL ANTIBIOTIC THERAPY (OPAT)  Indication: Serratia bacteremia and HD catheter infection Regimen: Cefepime  2gm IV every HD on MWF (to be given at hemodialysis center) End date: 10/17/2023   Fax weekly lab results  promptly to [phone] Call [phone] with any questions  IV antibiotic discharge orders are pended. To discharging provider:  please sign these orders via discharge navigator,  Select New Orders & click on the button choice - Manage This Unsigned Work.     Thank you for allowing pharmacy to be a part of this patient's care.  Ahliyah Nienow, PharmD, BCPS, BCIDP Work Cell: [phone] 10/08/2023 12:22 PM

## 2023-10-08 NOTE — Progress Notes (Signed)
 [facility]  ROUNDING NOTE   Subjective:   Amy Wu  is a 73 y.o.  female with past medical history of hypertension, diabetes, PVD, dyslipidemia, and end-stage renal disease on dialysis. She presents to the ED via EMS for evaluation of altered mental status and fever and has been admitted under observation for Acute respiratory failure with hypoxia (HCC) [J96.01] Sepsis (HCC) [A41.9] Sepsis, due to unspecified organism, unspecified whether acute organ dysfunction present [facility]) [A41.9]  Patient is known to our practice and receives outpatient dialysis treatments at [facility] on a MWF schedule, supervised by [facility] physicians.   Update: Patient due for hemodialysis treatment today. Resting comfortably in bed this a.m.   Objective:  Vital signs in last 24 hours:  Temp:  [97.6 F (36.4 C)-98.6 F (37 C)] 97.6 F (36.4 C) (04/21 0810) Pulse Rate:  [65-82] 77 (04/21 1200) Resp:  [11-29] 14 (04/21 1200) BP: (94-140)/(46-85) 105/66 (04/21 1200) SpO2:  [93 %-100 %] 95 % (04/21 1200) Weight:  [96.6 kg-100.6 kg] 96.6 kg (04/21 0810)  Weight change: 3.2 kg Filed Weights   10/07/23 0453 10/08/23 0500 10/08/23 0810  Weight: 96.8 kg 100.6 kg 96.6 kg    Intake/Output: I/O last 3 completed shifts: In: 630 [P.O.:240; I.V.:290; IV Piggyback:100] Out: 0    Intake/Output this shift:  No intake/output data recorded.  Physical Exam: General: NAD  Head: Normocephalic, atraumatic.   Eyes: Anicteric  Lungs:  Diminished, [place] O2  Heart: Regular rate and rhythm  Abdomen:  Soft, nontender  Extremities: Trace Rt stump peripheral edema. Bilat AKA  Neurologic: Alert, moving all four extremities  Skin: No lesions  Access: Lt thigh permcath placed 4/18    Basic Metabolic Panel: Recent Labs  Lab 10/03/23 0222 10/04/23 0441 10/05/23 0556 10/06/23 0511 10/07/23 0153 10/08/23 0827  NA 133* 133* 133* 132* 134* 133*  K 3.3* 3.3* 4.4 4.1 3.5 3.2*  CL 97* 98 98 97* 96*  98  CO2 21* 23 21* 21* 24 23  GLUCOSE 148* 155* 318* 414* 288* 155*  BUN 32* 40* 52* 60* 40* 51*  CREATININE 6.23* 7.42* 8.34* 9.06* 6.13* 7.32*  CALCIUM  8.3* 8.2* 8.6* 8.7* 8.8* 8.6*  MG 1.9 2.1  --   --   --   --   PHOS 4.0 4.3  --   --   --  3.8    Liver Function Tests: Recent Labs  Lab 10/02/23 0600 10/08/23 0827  AST 81*  --   ALT 16  --   ALKPHOS 68  --   BILITOT 1.0  --   PROT 6.3*  --   ALBUMIN 2.9* 2.5*   No results for input(s): "LIPASE", "AMYLASE" in the last 168 hours. Recent Labs  Lab 10/01/23 2221  AMMONIA 12    CBC: Recent Labs  Lab 10/05/23 0556 10/06/23 0511 10/07/23 0153 10/08/23 0551 10/08/23 0827  WBC 7.4 10.7* 11.1* 12.4* 11.8*  HGB 11.1* 10.6* 10.9* 10.3* 10.1*  HCT 33.4* 30.9* 32.5* 30.6* 30.1*  MCV 76.1* 74.8* 77.4* 76.7* 77.0*  PLT 137* 181 159 164 165    Cardiac Enzymes: No results for input(s): "CKTOTAL", "CKMB", "CKMBINDEX", "TROPONINI" in the last 168 hours.  BNP: Invalid input(s): "POCBNP"  CBG: Recent Labs  Lab 10/06/23 2024 10/07/23 0728 10/07/23 1216 10/07/23 1558 10/07/23 2233  GLUCAP 202* 237* 327* 274* 149*    Microbiology: Results for orders placed or performed during the hospital encounter of 10/01/23  Resp panel by RT-PCR (RSV, Flu A&B,  Covid) Anterior Nasal Swab     Status: None   Collection Time: 10/01/23  9:12 AM   Specimen: Anterior Nasal Swab  Result Value Ref Range Status   SARS Coronavirus 2 by RT PCR NEGATIVE NEGATIVE Final    Comment: (NOTE) SARS-CoV-2 target nucleic acids are NOT DETECTED.  The SARS-CoV-2 RNA is generally detectable in upper respiratory specimens during the acute phase of infection. The lowest concentration of SARS-CoV-2 viral copies this assay can detect is 138 copies/mL. A negative result does not preclude SARS-Cov-2 infection and should not be used as the sole basis for treatment or other patient management decisions. A negative result may occur with  improper specimen  collection/handling, submission of specimen other than nasopharyngeal swab, presence of viral mutation(s) within the areas targeted by this assay, and inadequate number of viral copies(<138 copies/mL). A negative result must be combined with clinical observations, patient history, and epidemiological information. The expected result is Negative.  Fact Sheet for Patients:  BloggerCourse.com  Fact Sheet for Healthcare Providers:  SeriousBroker.it  This test is no t yet approved or cleared by the [place]  FDA and  has been authorized for detection and/or diagnosis of SARS-CoV-2 by FDA under an Emergency Use Authorization (EUA). This EUA will remain  in effect (meaning this test can be used) for the duration of the COVID-19 declaration under Section 564(b)(1) of the Act, 21 U.S.C.section 360bbb-3(b)(1), unless the authorization is terminated  or revoked sooner.       Influenza A by PCR NEGATIVE NEGATIVE Final   Influenza B by PCR NEGATIVE NEGATIVE Final    Comment: (NOTE) The Xpert Xpress SARS-CoV-2/FLU/RSV plus assay is intended as an aid in the diagnosis of influenza from Nasopharyngeal swab specimens and should not be used as a sole basis for treatment. Nasal washings and aspirates are unacceptable for Xpert Xpress SARS-CoV-2/FLU/RSV testing.  Fact Sheet for Patients: BloggerCourse.com  Fact Sheet for Healthcare Providers: SeriousBroker.it  This test is not yet approved or cleared by the [place]  FDA and has been authorized for detection and/or diagnosis of SARS-CoV-2 by FDA under an Emergency Use Authorization (EUA). This EUA will remain in effect (meaning this test can be used) for the duration of the COVID-19 declaration under Section 564(b)(1) of the Act, 21 U.S.C. section 360bbb-3(b)(1), unless the authorization is terminated or revoked.     Resp Syncytial  Virus by PCR NEGATIVE NEGATIVE Final    Comment: (NOTE) Fact Sheet for Patients: BloggerCourse.com  Fact Sheet for Healthcare Providers: SeriousBroker.it  This test is not yet approved or cleared by the [place]  FDA and has been authorized for detection and/or diagnosis of SARS-CoV-2 by FDA under an Emergency Use Authorization (EUA). This EUA will remain in effect (meaning this test can be used) for the duration of the COVID-19 declaration under Section 564(b)(1) of the Act, 21 U.S.C. section 360bbb-3(b)(1), unless the authorization is terminated or revoked.  Performed at [facility], [address] [place], [place] [address]   Blood Culture (routine x 2)     Status: Abnormal   Collection Time: 10/01/23  9:12 AM   Specimen: BLOOD  Result Value Ref Range Status   Specimen Description   Final    BLOOD BLOOD LEFT FOREARM Performed at [facility], [address], [place], [place] [address]    Special Requests   Final    BOTTLES DRAWN AEROBIC AND ANAEROBIC Blood Culture results may not be optimal due to an inadequate volume of blood received in  culture bottles Performed at [facility], [address] [place], [place] [address]    Culture  Setup Time   Final    GRAM NEGATIVE RODS IN BOTH AEROBIC AND ANAEROBIC BOTTLES CRITICAL VALUE NOTED.  VALUE IS CONSISTENT WITH PREVIOUSLY REPORTED AND CALLED VALUE. Performed at [facility], [address] [place], [place] [address]    Culture (A)  Final    SERRATIA MARCESCENS SUSCEPTIBILITIES PERFORMED ON PREVIOUS CULTURE WITHIN THE LAST 5 DAYS. Performed at [facility], [address], [place], [place] [address]    Report Status 10/04/2023 FINAL  Final  Respiratory (~20 pathogens) panel by PCR     Status: None   Collection Time: 10/01/23  9:12 AM   Specimen: Nasopharyngeal Swab; Respiratory  Result Value Ref Range Status    Adenovirus NOT DETECTED NOT DETECTED Final   Coronavirus 229E NOT DETECTED NOT DETECTED Final    Comment: (NOTE) The Coronavirus on the Respiratory Panel, DOES NOT test for the novel  Coronavirus (2019 nCoV)    Coronavirus HKU1 NOT DETECTED NOT DETECTED Final   Coronavirus NL63 NOT DETECTED NOT DETECTED Final   Coronavirus OC43 NOT DETECTED NOT DETECTED Final   Metapneumovirus NOT DETECTED NOT DETECTED Final   Rhinovirus / Enterovirus NOT DETECTED NOT DETECTED Final   Influenza A NOT DETECTED NOT DETECTED Final   Influenza B NOT DETECTED NOT DETECTED Final   Parainfluenza Virus 1 NOT DETECTED NOT DETECTED Final   Parainfluenza Virus 2 NOT DETECTED NOT DETECTED Final   Parainfluenza Virus 3 NOT DETECTED NOT DETECTED Final   Parainfluenza Virus 4 NOT DETECTED NOT DETECTED Final   Respiratory Syncytial Virus NOT DETECTED NOT DETECTED Final   Bordetella pertussis NOT DETECTED NOT DETECTED Final   Bordetella Parapertussis NOT DETECTED NOT DETECTED Final   Chlamydophila pneumoniae NOT DETECTED NOT DETECTED Final   Mycoplasma pneumoniae NOT DETECTED NOT DETECTED Final    Comment: Performed at [facility], [address], [place], [place] [address]  Blood Culture (routine x 2)     Status: Abnormal   Collection Time: 10/01/23  9:33 AM   Specimen: BLOOD  Result Value Ref Range Status   Specimen Description   Final    BLOOD RIGHT WRIST Performed at [facility], [address] [place], [place] [address]    Special Requests   Final    BOTTLES DRAWN AEROBIC AND ANAEROBIC Blood Culture adequate volume Performed at [facility], [address] [place], [place] [address]    Culture  Setup Time   Final    GRAM NEGATIVE RODS IN BOTH AEROBIC AND ANAEROBIC BOTTLES CRITICAL RESULT CALLED TO, READ BACK BY AND VERIFIED WITH:  NATHAN BELUE AT 0457 10/02/23 JG Performed at [facility], [address] [place], [place] [address]    Culture SERRATIA MARCESCENS (A)   Final   Report Status 10/04/2023 FINAL  Final   Organism ID, Bacteria SERRATIA MARCESCENS  Final      Susceptibility   Serratia marcescens - MIC*    CEFEPIME  <=0.12 SENSITIVE Sensitive     CEFTAZIDIME <=1 SENSITIVE Sensitive     CEFTRIAXONE  <=0.25 SENSITIVE Sensitive     CIPROFLOXACIN  <=0.25 SENSITIVE Sensitive     GENTAMICIN <=1 SENSITIVE Sensitive     TRIMETH /SULFA  <=20 SENSITIVE Sensitive     * SERRATIA MARCESCENS  Blood Culture ID Panel (Reflexed)     Status: Abnormal   Collection Time: 10/01/23  9:33 AM  Result Value Ref  Range Status   Enterococcus faecalis NOT DETECTED NOT DETECTED Final   Enterococcus Faecium NOT DETECTED NOT DETECTED Final   Listeria monocytogenes NOT DETECTED NOT DETECTED Final   Staphylococcus species NOT DETECTED NOT DETECTED Final   Staphylococcus aureus (BCID) NOT DETECTED NOT DETECTED Final   Staphylococcus epidermidis NOT DETECTED NOT DETECTED Final   Staphylococcus lugdunensis NOT DETECTED NOT DETECTED Final   Streptococcus species NOT DETECTED NOT DETECTED Final   Streptococcus agalactiae NOT DETECTED NOT DETECTED Final   Streptococcus pneumoniae NOT DETECTED NOT DETECTED Final   Streptococcus pyogenes NOT DETECTED NOT DETECTED Final   A.calcoaceticus-baumannii NOT DETECTED NOT DETECTED Final   Bacteroides fragilis NOT DETECTED NOT DETECTED Final   Enterobacterales DETECTED (A) NOT DETECTED Final    Comment: Enterobacterales represent a large order of gram negative bacteria, not a single organism. CRITICAL RESULT CALLED TO, READ BACK BY AND VERIFIED WITH:  NATHAN BELUE AT 0457 10/02/23 JG    Enterobacter cloacae complex NOT DETECTED NOT DETECTED Final   Escherichia coli NOT DETECTED NOT DETECTED Final   Klebsiella aerogenes NOT DETECTED NOT DETECTED Final   Klebsiella oxytoca NOT DETECTED NOT DETECTED Final   Klebsiella pneumoniae NOT DETECTED NOT DETECTED Final   Proteus species NOT DETECTED NOT DETECTED Final   Salmonella species NOT DETECTED  NOT DETECTED Final   Serratia marcescens DETECTED (A) NOT DETECTED Final    Comment: CRITICAL RESULT CALLED TO, READ BACK BY AND VERIFIED WITH:  NATHAN BELUE AT 0457 10/02/23 JG    Haemophilus influenzae NOT DETECTED NOT DETECTED Final   Neisseria meningitidis NOT DETECTED NOT DETECTED Final   Pseudomonas aeruginosa NOT DETECTED NOT DETECTED Final   Stenotrophomonas maltophilia NOT DETECTED NOT DETECTED Final   Candida albicans NOT DETECTED NOT DETECTED Final   Candida auris NOT DETECTED NOT DETECTED Final   Candida glabrata NOT DETECTED NOT DETECTED Final   Candida krusei NOT DETECTED NOT DETECTED Final   Candida parapsilosis NOT DETECTED NOT DETECTED Final   Candida tropicalis NOT DETECTED NOT DETECTED Final   Cryptococcus neoformans/gattii NOT DETECTED NOT DETECTED Final   CTX-M ESBL NOT DETECTED NOT DETECTED Final   Carbapenem resistance IMP NOT DETECTED NOT DETECTED Final   Carbapenem resistance KPC NOT DETECTED NOT DETECTED Final   Carbapenem resistance NDM NOT DETECTED NOT DETECTED Final   Carbapenem resist OXA 48 LIKE NOT DETECTED NOT DETECTED Final   Carbapenem resistance VIM NOT DETECTED NOT DETECTED Final    Comment: Performed at [facility], [address] [place], [place] [address]  MRSA Next Gen by PCR, Nasal     Status: None   Collection Time: 10/02/23  5:26 PM   Specimen: Nasal Mucosa; Nasal Swab  Result Value Ref Range Status   MRSA by PCR Next Gen NOT DETECTED NOT DETECTED Final    Comment: (NOTE) The GeneXpert MRSA Assay (FDA approved for NASAL specimens only), is one component of a comprehensive MRSA colonization surveillance program. It is not intended to diagnose MRSA infection nor to guide or monitor treatment for MRSA infections. Test performance is not FDA approved in patients less than 79 years old. Performed at [facility], [address], [place], [place] [address]   Cath Tip Culture     Status: Abnormal   Collection Time:  10/02/23  9:28 PM   Specimen: Catheter Tip; Other  Result Value Ref Range Status   Specimen Description   Final    CATH TIP Performed at [facility], [address]  [place], [place] [address]    Special Requests   Final    NONE Performed at [facility], [address] [place], [place] [address]    Culture >=100,000 COLONIES/mL SERRATIA MARCESCENS (A)  Final   Report Status 10/08/2023 FINAL  Final   Organism ID, Bacteria SERRATIA MARCESCENS (A)  Final      Susceptibility   Serratia marcescens - MIC*    CEFEPIME  <=0.12 SENSITIVE Sensitive     CEFTAZIDIME <=1 SENSITIVE Sensitive     CEFTRIAXONE  <=0.25 SENSITIVE Sensitive     CIPROFLOXACIN  <=0.25 SENSITIVE Sensitive     GENTAMICIN <=1 SENSITIVE Sensitive     TRIMETH /SULFA  <=20 SENSITIVE Sensitive     * >=100,000 COLONIES/mL SERRATIA MARCESCENS  Culture, blood (Routine X 2) w Reflex to ID Panel     Status: None   Collection Time: 10/03/23  1:00 PM   Specimen: BLOOD  Result Value Ref Range Status   Specimen Description BLOOD BLOOD LEFT WRIST  Final   Special Requests   Final    BOTTLES DRAWN AEROBIC ONLY Blood Culture adequate volume   Culture   Final    NO GROWTH 5 DAYS Performed at [facility], [address] [place], [place] [address]    Report Status 10/08/2023 FINAL  Final  Culture, blood (Routine X 2) w Reflex to ID Panel     Status: None   Collection Time: 10/03/23  1:04 PM   Specimen: BLOOD  Result Value Ref Range Status   Specimen Description BLOOD BLOOD LEFT FOREARM  Final   Special Requests   Final    BOTTLES DRAWN AEROBIC ONLY Blood Culture adequate volume   Culture   Final    NO GROWTH 5 DAYS Performed at [facility], [address] [place], [place] [address]    Report Status 10/08/2023 FINAL  Final    Coagulation Studies: Recent Labs    10/06/23 17-Oct-2101  LABPROT 15.7*  INR 1.2     Urinalysis: No results for input(s): "COLORURINE", "LABSPEC", "PHURINE",  "GLUCOSEU", "HGBUR", "BILIRUBINUR", "KETONESUR", "PROTEINUR", "UROBILINOGEN", "NITRITE", "LEUKOCYTESUR" in the last 72 hours.  Invalid input(s): "APPERANCEUR"    Imaging: No results found.     Medications:    ceFEPime  (MAXIPIME ) IV 1 g (10/07/23 2247)   heparin  1,250 Units/hr (10/06/23 1808)    amiodarone   200 mg Oral BID   aspirin  EC  81 mg Oral Daily   atorvastatin   80 mg Oral Daily   Chlorhexidine  Gluconate Cloth  6 each Topical Q0600   insulin  aspart  0-15 Units Subcutaneous TID WC   insulin  aspart  0-5 Units Subcutaneous QHS   insulin  aspart  5 Units Subcutaneous TID WC   insulin  glargine-yfgn  5 Units Subcutaneous Q2200   acetaminophen , alteplase , gabapentin , Gerhardt's butt cream, heparin , ondansetron  **OR** ondansetron  (ZOFRAN ) IV  Assessment/ Plan:  Ms. Malachi G Buckbee is a 73 y.o.  female with past medical history of hypertension, diabetes, PVD, dyslipidemia, and end-stage renal disease on dialysis. She presents to the ED via EMS for evaluation of altered mental status and fever and has been admitted under observation for Acute respiratory failure with hypoxia (HCC) [J96.01] Sepsis (HCC) [A41.9] Sepsis, due to unspecified organism, unspecified whether acute organ dysfunction present (HCC) [A41.9]  [facility]/MWF/Rt thigh Permcath  End stage renal disease on hemodialysis.  Patient due for hemodialysis treatment today and orders have been prepared.  Sepsis, secondary to line infection. Admitted with fever 103, and elevated white count, 12.2. Blood culture  positive for serratia marcescens. ID request removal of tunneled catheter, removed on 4/15 with tip sent for culture. Catheter positive for same bacteria. Currently prescribed cefepime .    Appreciate vascular replacing HD permcath. Outpatient clinic notified of antibiotics, Cefepime  2g/2g/2g until 4/30.   3. Acute respiratory failure requiring Bipap. Volume overload.  Doing much better today.  4. Anemia  of chronic kidney disease Lab Results  Component Value Date   HGB 10.1 (L) 10/08/2023  Hemoglobin currently at target at 10.1.  She will receive Mircera as outpatient.  5. Secondary Hyperparathyroidism: with outpatient labs: None available   Lab Results  Component Value Date   CALCIUM  8.6 (L) 10/08/2023   PHOS 3.8 10/08/2023    Continue periodically monitor bone metabolism parameters as outpatient.  Phosphorus at target at 3.8.   LOS: 6 Caeleb Batalla 4/21/20251:15 PM

## 2023-10-08 NOTE — Progress Notes (Signed)
 PROGRESS NOTE    Amy Wu   WJX:[id number] DOB: Dec 25, 1950  DOA: 10/01/2023 Date of Service: 10/08/23 which is hospital day 6  PCP: Izella Marshal, Alissa April, [facility] course / significant events:   HPI:  Amy Wu is a 73 y.o. female with medical history significant of end-stage renal disease on hemodialysis MWF, type 2 diabetes, hyperlipidemia, peripheral vascular disease presenting to ED 10/01/23 from home with generalized malaise and fevers, confusion per family, mild cough/SOB. No abdominal pain but some diarrhea. Missed hemodialysis 04/13 secondary to symptoms.   04/14: admitted to hospitalist service for sepsis as well as acute resp fail w/ hypoxia and concern for volume overload. Requiring BiPAP  04/15: (+)bacteremia w/ serratia, abx to cefepime . Pt transferred to the stepdown unit for pressors. ID consult, HD catheter removed due to likely source of sepsis. Elevated troponins, started heparin   04/16: Off levo later today and onto scheduled midodrine . Echo ordered. Cardiology consulted, continue heparin  infusion, not on beta-blocker secondary to bradycardia, scheduled for cardiac cath on 04/17. Transthoracic echo ok. Repeating BCx today. Plan reinsertion HD cath 04/18.  04/17: LHC today. Significant CAD, no PCI 04/18: L femoral dialysis cath placed, planning dialysis tomorrow. Glc substantial increase, adjusting sliding scale  04/19: New onset atrial flutter, cardiology following, started heparin  and amiodarone . Dialysis today. Still quite hyperglycemic.  04/20: converted to sinus. Son unable to get meds from pharmacy, pt will need Eliquis  - will plan dialysis tomorrow, transition to eliquis  and confirm home meds access and DME, hopefully will be able to dc tomorrow  04/21: hospital bed will be delivered tomorrow and [facility] can transport in AM, anticipate home tomorrow.    Consultants:  Infectious disease Vascular surgery  [facility] Cardiology  Procedures/Surgeries: 10/04/23  left heart cath   10/05/23 placement permcath - tunneled hemodialysis catheter via the left femoral vein       ASSESSMENT & PLAN:   Severe Sepsis w/ Serratia bacteremia likely source HD catheter which has been removed Complicated by septic shock, see below  s/p HD catheter placement 10/05/23 following neg repeat BCx Cefepime  to continue at dialysis outpatient, see med rec, confirmed w/ nephrology this has been ordered   Septic shock requiring vasopressor support, shock resolved and now off IV pressors Treat underlying cause(s) as noted midodrine   New atrial flutter New onset this morning 04/19 Documented on EKG today Rate controlled in the 70s Asymptomatic start amiodarone  200 twice daily x 2 weeks then down to 200 daily   Dc heparin  start eliquis  today  Elevated troponin secondary NSTEMI vs. demand ischemia  Coronary Artery Disease w/ extensive calcification / atherosclerotic disease  Hx: HTN, HLD and PVD Echo 10/02/23: EF >55%; mild tricuspid valve regurgitation; trivial pulmonic valve regurgitation  Cardiac catheterization was attempted 10/04/23 but was very challenging due to difficult vascular anatomy. Only the right coronary artery could be engaged and was found to have sequential 80 to 90% lesions.  aspirin  81 mg daily d/c on eliquis   atorvastatin   Beta blocker has caused bradycardia/hypotension, defer this medication    Acute hypoxic respiratory failure - improved  Likely d/t pulmonary vascular congestion d/t missed dialysis Supplemental O2 for dyspnea and/or hypoxia  Maintain O2 sats 92% or higher  Fluid removal with HD   Acute metabolic encephalopathy d/t severe sepsis - resolved  Treat underlying cause(s) as noted Correct metabolic derangements  Avoid sedating medications as able  Maintain sleep/wake cycle     ESRD on HD (M-W-F) Hyponatremia s/p HD  catheter placement 10/05/23 Trend BMP per nephrology / as needed  Replace electrolytes as indicated   Nephrology consulted appreciate input   Elevated AST  Trend hepatic function panel  Avoid hepatotoxic agents as able    Type II diabetes mellitus  PT had previously been d/c insulin  outpatient d/t severe hypoglycemia, was normoglycemic initially and A1C <7 however Glc past few days has been quite high, diet has been non-carb-controlled, question d/t steroid dose   CBG's ac/hs/prn diet to renal/carb   SSI decreased  Target CBG's 140 to 180     Class 3 obesity based on BMI: Body mass index is 47.66 kg/m.  Underweight - under 18  overweight - 25 to 29 obese - 30 or more Class 1 obesity: BMI of 30.0 to 34 Class 2 obesity: BMI of 35.0 to 39 Class 3 obesity: BMI of 40.0 to 49 Super Morbid Obesity: BMI 50-59 Super-super Morbid Obesity: BMI 60+ Significantly low or high BMI is associated with higher medical risk.  Weight management advised as adjunct to other disease management and risk reduction treatments    DVT prophylaxis: on heparin  gtt  IV fluids: no continuous IV fluids  Nutrition: NPO pending cardiac cath Central lines / other devices: none  Code Status: DNR ACP documentation reviewed:  none on file in [facility]  TOC needs: [facility] transport, DME  Medical barriers to dispo: none, expect dc tomorrow once DME in place and [facility] can transort home             Subjective / Brief ROS:  Patient reports feeling tired today Denies CP/SOB.  Pain controlled.  Denies new weakness.    Family Communication: will call family later today    Objective Findings:  Vitals:   10/08/23 1130 10/08/23 1200 10/08/23 1327 10/08/23 1330  BP: (!) 140/58 105/66 (!) 132/55 (!) 148/60  Pulse: 77 77 84 84  Resp: 15 14 (!) 24 (!) 21  Temp:   97.8 F (36.6 C)   TempSrc:   Oral   SpO2: 99% 95% 98% 98%  Weight:    96.3 kg  Height:        Intake/Output Summary (Last 24 hours) at 10/08/2023 1419 Last data filed at 10/08/2023 1327 Gross per 24 hour  Intake 390.04 ml  Output 300 ml   Net 90.04 ml   Filed Weights   10/08/23 0500 10/08/23 0810 10/08/23 1330  Weight: 100.6 kg 96.6 kg 96.3 kg    Examination:  Physical Exam Constitutional:      General: She is not in acute distress. Cardiovascular:     Rate and Rhythm: Normal rate and regular rhythm.  Pulmonary:     Effort: Pulmonary effort is normal.     Breath sounds: Normal breath sounds.  Abdominal:     Palpations: Abdomen is soft.  Skin:    General: Skin is warm and dry.  Neurological:     General: No focal deficit present.     Mental Status: She is alert and oriented to person, place, and time. Mental status is at baseline.     Comments: More alert and less tangential today   Psychiatric:        Mood and Affect: Mood normal.        Behavior: Behavior normal.          Scheduled Medications:   amiodarone   200 mg Oral BID   aspirin  EC  81 mg Oral Daily   atorvastatin   80 mg Oral Daily   Chlorhexidine  Gluconate  Cloth  6 each Topical Q0600   insulin  aspart  0-15 Units Subcutaneous TID WC   insulin  aspart  0-5 Units Subcutaneous QHS   insulin  aspart  5 Units Subcutaneous TID WC   insulin  glargine-yfgn  5 Units Subcutaneous Q2200    Continuous Infusions:  ceFEPime  (MAXIPIME ) IV     [START ON 10/10/2023] ceFEPime  (MAXIPIME ) IV     heparin  1,250 Units/hr (10/06/23 1808)    PRN Medications:  acetaminophen , gabapentin , Gerhardt's butt cream, ondansetron  **OR** ondansetron  (ZOFRAN ) IV  Antimicrobials from admission:  Anti-infectives (From admission, onward)    Start     Dose/Rate Route Frequency Ordered Stop   10/10/23 1200  ceFEPIme  (MAXIPIME ) 2 g in sodium chloride  0.9 % 100 mL IVPB        2 g 200 mL/hr over 30 Minutes Intravenous Every M-W-F (Hemodialysis) 10/08/23 1417 10/19/23 1159   10/10/23 0000  ceFEPime  (MAXIPIME ) IVPB        2 g Intravenous Every M-W-F (Hemodialysis) 10/08/23 1416 10/17/23 2359   10/08/23 1600  ceFEPIme  (MAXIPIME ) 2 g in sodium chloride  0.9 % 100 mL IVPB        Placed in "Followed by" Linked Group   2 g 200 mL/hr over 30 Minutes Intravenous  Once 10/08/23 1417     10/05/23 1021  vancomycin  (VANCOCIN ) IVPB 1000 mg/200 mL premix        1,000 mg 200 mL/hr over 60 Minutes Intravenous 60 min pre-op 10/05/23 1021 10/05/23 1214   10/03/23 1000  levofloxacin  (LEVAQUIN ) IVPB 500 mg  Status:  Discontinued        500 mg 100 mL/hr over 60 Minutes Intravenous Every 48 hours 10/01/23 1445 10/01/23 1458   10/03/23 1000  cefTRIAXone  (ROCEPHIN ) 2 g in sodium chloride  0.9 % 100 mL IVPB  Status:  Discontinued        2 g 200 mL/hr over 30 Minutes Intravenous Every 24 hours 10/01/23 1459 10/02/23 0550   10/02/23 2200  ceFEPIme  (MAXIPIME ) 1 g in sodium chloride  0.9 % 100 mL IVPB  Status:  Discontinued       Placed in "Followed by" Linked Group   1 g 200 mL/hr over 30 Minutes Intravenous Every 24 hours 10/02/23 0550 10/08/23 1417   10/02/23 1000  azithromycin  (ZITHROMAX ) 500 mg in sodium chloride  0.9 % 250 mL IVPB  Status:  Discontinued        500 mg 250 mL/hr over 60 Minutes Intravenous Daily 10/01/23 1459 10/02/23 1553   10/02/23 0645  ceFEPIme  (MAXIPIME ) 1 g in sodium chloride  0.9 % 100 mL IVPB       Placed in "Followed by" Linked Group   1 g 200 mL/hr over 30 Minutes Intravenous  Once 10/02/23 0550 10/02/23 0717   10/01/23 0915  vancomycin  (VANCOCIN ) IVPB 1000 mg/200 mL premix        1,000 mg 200 mL/hr over 60 Minutes Intravenous  Once 10/01/23 0913 10/01/23 1058   10/01/23 0915  levofloxacin  (LEVAQUIN ) IVPB 750 mg        750 mg 100 mL/hr over 90 Minutes Intravenous  Once 10/01/23 0913 10/01/23 1807           Data Reviewed:  I have personally reviewed the following...  CBC: Recent Labs  Lab 10/05/23 0556 10/06/23 0511 10/07/23 0153 10/08/23 0551 10/08/23 0827  WBC 7.4 10.7* 11.1* 12.4* 11.8*  HGB 11.1* 10.6* 10.9* 10.3* 10.1*  HCT 33.4* 30.9* 32.5* 30.6* 30.1*  MCV 76.1* 74.8* 77.4* 76.7* 77.0*  PLT 137* 181  159 164 165   Basic  Metabolic Panel: Recent Labs  Lab 10/03/23 0222 10/04/23 0441 10/05/23 0556 10/06/23 0511 10/07/23 0153 10/08/23 0827  NA 133* 133* 133* 132* 134* 133*  K 3.3* 3.3* 4.4 4.1 3.5 3.2*  CL 97* 98 98 97* 96* 98  CO2 21* 23 21* 21* 24 23  GLUCOSE 148* 155* 318* 414* 288* 155*  BUN 32* 40* 52* 60* 40* 51*  CREATININE 6.23* 7.42* 8.34* 9.06* 6.13* 7.32*  CALCIUM  8.3* 8.2* 8.6* 8.7* 8.8* 8.6*  MG 1.9 2.1  --   --   --   --   PHOS 4.0 4.3  --   --   --  3.8   GFR: Estimated Creatinine Clearance: 6.8 mL/min (A) (by C-G formula based on SCr of 7.32 mg/dL (H)). Liver Function Tests: Recent Labs  Lab 10/02/23 0600 10/08/23 0827  AST 81*  --   ALT 16  --   ALKPHOS 68  --   BILITOT 1.0  --   PROT 6.3*  --   ALBUMIN 2.9* 2.5*   No results for input(s): "LIPASE", "AMYLASE" in the last 168 hours. Recent Labs  Lab 10/01/23 2221  AMMONIA 12   Coagulation Profile: Recent Labs  Lab 10/03/23 1018 10/06/23 2103  INR 1.2 1.2   Cardiac Enzymes: No results for input(s): "CKTOTAL", "CKMB", "CKMBINDEX", "TROPONINI" in the last 168 hours. BNP (last 3 results) No results for input(s): "PROBNP" in the last 8760 hours. HbA1C: No results for input(s): "HGBA1C" in the last 72 hours.  CBG: Recent Labs  Lab 10/06/23 2024 10/07/23 0728 10/07/23 1216 10/07/23 1558 10/07/23 2233  GLUCAP 202* 237* 327* 274* 149*   Lipid Profile: No results for input(s): "CHOL", "HDL", "LDLCALC", "TRIG", "CHOLHDL", "LDLDIRECT" in the last 72 hours.  Thyroid Function Tests: No results for input(s): "TSH", "T4TOTAL", "FREET4", "T3FREE", "THYROIDAB" in the last 72 hours. Anemia Panel: No results for input(s): "VITAMINB12", "FOLATE", "FERRITIN", "TIBC", "IRON", "RETICCTPCT" in the last 72 hours. Most Recent Urinalysis On File:     Component Value Date/Time   COLORURINE Yellow 12/28/2011 1746   APPEARANCEUR Turbid 12/28/2011 1746   LABSPEC 1.010 12/28/2011 1746   PHURINE 8.0 12/28/2011 1746   GLUCOSEU  >=500 12/28/2011 1746   HGBUR 2+ 12/28/2011 1746   BILIRUBINUR Negative 12/28/2011 1746   KETONESUR Negative 12/28/2011 1746   PROTEINUR 100 mg/dL 16/03/9603 5409   NITRITE Negative 12/28/2011 1746   LEUKOCYTESUR 3+ 12/28/2011 1746   Sepsis Labs: @LABRCNTIP (procalcitonin:4,lacticidven:4) Microbiology: Recent Results (from the past 240 hours)  Resp panel by RT-PCR (RSV, Flu A&B, Covid) Anterior Nasal Swab     Status: None   Collection Time: 10/01/23  9:12 AM   Specimen: Anterior Nasal Swab  Result Value Ref Range Status   SARS Coronavirus 2 by RT PCR NEGATIVE NEGATIVE Final    Comment: (NOTE) SARS-CoV-2 target nucleic acids are NOT DETECTED.  The SARS-CoV-2 RNA is generally detectable in upper respiratory specimens during the acute phase of infection. The lowest concentration of SARS-CoV-2 viral copies this assay can detect is 138 copies/mL. A negative result does not preclude SARS-Cov-2 infection and should not be used as the sole basis for treatment or other patient management decisions. A negative result may occur with  improper specimen collection/handling, submission of specimen other than nasopharyngeal swab, presence of viral mutation(s) within the areas targeted by this assay, and inadequate number of viral copies(<138 copies/mL). A negative result must be combined with clinical observations, patient history, and epidemiological information. The expected  result is Negative.  Fact Sheet for Patients:  BloggerCourse.com  Fact Sheet for Healthcare Providers:  SeriousBroker.it  This test is no t yet approved or cleared by the [place]  FDA and  has been authorized for detection and/or diagnosis of SARS-CoV-2 by FDA under an Emergency Use Authorization (EUA). This EUA will remain  in effect (meaning this test can be used) for the duration of the COVID-19 declaration under Section 564(b)(1) of the Act, 21 U.S.C.section  360bbb-3(b)(1), unless the authorization is terminated  or revoked sooner.       Influenza A by PCR NEGATIVE NEGATIVE Final   Influenza B by PCR NEGATIVE NEGATIVE Final    Comment: (NOTE) The Xpert Xpress SARS-CoV-2/FLU/RSV plus assay is intended as an aid in the diagnosis of influenza from Nasopharyngeal swab specimens and should not be used as a sole basis for treatment. Nasal washings and aspirates are unacceptable for Xpert Xpress SARS-CoV-2/FLU/RSV testing.  Fact Sheet for Patients: BloggerCourse.com  Fact Sheet for Healthcare Providers: SeriousBroker.it  This test is not yet approved or cleared by the [place]  FDA and has been authorized for detection and/or diagnosis of SARS-CoV-2 by FDA under an Emergency Use Authorization (EUA). This EUA will remain in effect (meaning this test can be used) for the duration of the COVID-19 declaration under Section 564(b)(1) of the Act, 21 U.S.C. section 360bbb-3(b)(1), unless the authorization is terminated or revoked.     Resp Syncytial Virus by PCR NEGATIVE NEGATIVE Final    Comment: (NOTE) Fact Sheet for Patients: BloggerCourse.com  Fact Sheet for Healthcare Providers: SeriousBroker.it  This test is not yet approved or cleared by the [place]  FDA and has been authorized for detection and/or diagnosis of SARS-CoV-2 by FDA under an Emergency Use Authorization (EUA). This EUA will remain in effect (meaning this test can be used) for the duration of the COVID-19 declaration under Section 564(b)(1) of the Act, 21 U.S.C. section 360bbb-3(b)(1), unless the authorization is terminated or revoked.  Performed at [facility], [address], [place], [place] [address]   Blood Culture (routine x 2)     Status: Abnormal   Collection Time: 10/01/23  9:12 AM   Specimen: BLOOD  Result Value Ref Range Status   Specimen  Description   Final    BLOOD BLOOD LEFT FOREARM Performed at [facility], [address], [place], [place] [address]    Special Requests   Final    BOTTLES DRAWN AEROBIC AND ANAEROBIC Blood Culture results may not be optimal due to an inadequate volume of blood received in culture bottles Performed at [facility] [address], [place], [place] [address]    Culture  Setup Time   Final    GRAM NEGATIVE RODS IN BOTH AEROBIC AND ANAEROBIC BOTTLES CRITICAL VALUE NOTED.  VALUE IS CONSISTENT WITH PREVIOUSLY REPORTED AND CALLED VALUE. Performed at [facility], [address] [place], [place] [address]    Culture (A)  Final    SERRATIA MARCESCENS SUSCEPTIBILITIES PERFORMED ON PREVIOUS CULTURE WITHIN THE LAST 5 DAYS. Performed at [facility], [address], [place], [place] [address]    Report Status 10/04/2023 FINAL  Final  Respiratory (~20 pathogens) panel by PCR     Status: None   Collection Time: 10/01/23  9:12 AM   Specimen: Nasopharyngeal Swab; Respiratory  Result Value Ref Range Status   Adenovirus NOT DETECTED NOT DETECTED Final   Coronavirus 229E NOT DETECTED NOT DETECTED Final    Comment: (  NOTE) The Coronavirus on the Respiratory Panel, DOES NOT test for the novel  Coronavirus (2019 nCoV)    Coronavirus HKU1 NOT DETECTED NOT DETECTED Final   Coronavirus NL63 NOT DETECTED NOT DETECTED Final   Coronavirus OC43 NOT DETECTED NOT DETECTED Final   Metapneumovirus NOT DETECTED NOT DETECTED Final   Rhinovirus / Enterovirus NOT DETECTED NOT DETECTED Final   Influenza A NOT DETECTED NOT DETECTED Final   Influenza B NOT DETECTED NOT DETECTED Final   Parainfluenza Virus 1 NOT DETECTED NOT DETECTED Final   Parainfluenza Virus 2 NOT DETECTED NOT DETECTED Final   Parainfluenza Virus 3 NOT DETECTED NOT DETECTED Final   Parainfluenza Virus 4 NOT DETECTED NOT DETECTED Final   Respiratory Syncytial Virus NOT DETECTED NOT DETECTED Final   Bordetella  pertussis NOT DETECTED NOT DETECTED Final   Bordetella Parapertussis NOT DETECTED NOT DETECTED Final   Chlamydophila pneumoniae NOT DETECTED NOT DETECTED Final   Mycoplasma pneumoniae NOT DETECTED NOT DETECTED Final    Comment: Performed at [facility], [address] [place], [place] [address]  Blood Culture (routine x 2)     Status: Abnormal   Collection Time: 10/01/23  9:33 AM   Specimen: BLOOD  Result Value Ref Range Status   Specimen Description   Final    BLOOD RIGHT WRIST Performed at [facility], [address], [place], [place] [address]    Special Requests   Final    BOTTLES DRAWN AEROBIC AND ANAEROBIC Blood Culture adequate volume Performed at [facility], [address] [place], [place] [address]    Culture  Setup Time   Final    GRAM NEGATIVE RODS IN BOTH AEROBIC AND ANAEROBIC BOTTLES CRITICAL RESULT CALLED TO, READ BACK BY AND VERIFIED WITH:  NATHAN BELUE AT 0457 10/02/23 JG Performed at [facility], [address], [place], [place] [address]    Culture SERRATIA MARCESCENS (A)  Final   Report Status 10/04/2023 FINAL  Final   Organism ID, Bacteria SERRATIA MARCESCENS  Final      Susceptibility   Serratia marcescens - MIC*    CEFEPIME  <=0.12 SENSITIVE Sensitive     CEFTAZIDIME <=1 SENSITIVE Sensitive     CEFTRIAXONE  <=0.25 SENSITIVE Sensitive     CIPROFLOXACIN  <=0.25 SENSITIVE Sensitive     GENTAMICIN <=1 SENSITIVE Sensitive     TRIMETH /SULFA  <=20 SENSITIVE Sensitive     * SERRATIA MARCESCENS  Blood Culture ID Panel (Reflexed)     Status: Abnormal   Collection Time: 10/01/23  9:33 AM  Result Value Ref Range Status   Enterococcus faecalis NOT DETECTED NOT DETECTED Final   Enterococcus Faecium NOT DETECTED NOT DETECTED Final   Listeria monocytogenes NOT DETECTED NOT DETECTED Final   Staphylococcus species NOT DETECTED NOT DETECTED Final   Staphylococcus aureus (BCID) NOT DETECTED NOT DETECTED Final   Staphylococcus epidermidis  NOT DETECTED NOT DETECTED Final   Staphylococcus lugdunensis NOT DETECTED NOT DETECTED Final   Streptococcus species NOT DETECTED NOT DETECTED Final   Streptococcus agalactiae NOT DETECTED NOT DETECTED Final   Streptococcus pneumoniae NOT DETECTED NOT DETECTED Final   Streptococcus pyogenes NOT DETECTED NOT DETECTED Final   A.calcoaceticus-baumannii NOT DETECTED NOT DETECTED Final   Bacteroides fragilis NOT DETECTED NOT DETECTED Final   Enterobacterales DETECTED (A) NOT DETECTED Final    Comment: Enterobacterales represent a large order of gram negative bacteria, not a single organism. CRITICAL RESULT CALLED TO, READ BACK BY AND VERIFIED WITH:  NATHAN BELUE AT 0457  10/02/23 JG    Enterobacter cloacae complex NOT DETECTED NOT DETECTED Final   Escherichia coli NOT DETECTED NOT DETECTED Final   Klebsiella aerogenes NOT DETECTED NOT DETECTED Final   Klebsiella oxytoca NOT DETECTED NOT DETECTED Final   Klebsiella pneumoniae NOT DETECTED NOT DETECTED Final   Proteus species NOT DETECTED NOT DETECTED Final   Salmonella species NOT DETECTED NOT DETECTED Final   Serratia marcescens DETECTED (A) NOT DETECTED Final    Comment: CRITICAL RESULT CALLED TO, READ BACK BY AND VERIFIED WITH:  NATHAN BELUE AT 0457 10/02/23 JG    Haemophilus influenzae NOT DETECTED NOT DETECTED Final   Neisseria meningitidis NOT DETECTED NOT DETECTED Final   Pseudomonas aeruginosa NOT DETECTED NOT DETECTED Final   Stenotrophomonas maltophilia NOT DETECTED NOT DETECTED Final   Candida albicans NOT DETECTED NOT DETECTED Final   Candida auris NOT DETECTED NOT DETECTED Final   Candida glabrata NOT DETECTED NOT DETECTED Final   Candida krusei NOT DETECTED NOT DETECTED Final   Candida parapsilosis NOT DETECTED NOT DETECTED Final   Candida tropicalis NOT DETECTED NOT DETECTED Final   Cryptococcus neoformans/gattii NOT DETECTED NOT DETECTED Final   CTX-M ESBL NOT DETECTED NOT DETECTED Final   Carbapenem resistance IMP NOT  DETECTED NOT DETECTED Final   Carbapenem resistance KPC NOT DETECTED NOT DETECTED Final   Carbapenem resistance NDM NOT DETECTED NOT DETECTED Final   Carbapenem resist OXA 48 LIKE NOT DETECTED NOT DETECTED Final   Carbapenem resistance VIM NOT DETECTED NOT DETECTED Final    Comment: Performed at [facility], [address] [place], [place] [address]  MRSA Next Gen by PCR, Nasal     Status: None   Collection Time: 10/02/23  5:26 PM   Specimen: Nasal Mucosa; Nasal Swab  Result Value Ref Range Status   MRSA by PCR Next Gen NOT DETECTED NOT DETECTED Final    Comment: (NOTE) The GeneXpert MRSA Assay (FDA approved for NASAL specimens only), is one component of a comprehensive MRSA colonization surveillance program. It is not intended to diagnose MRSA infection nor to guide or monitor treatment for MRSA infections. Test performance is not FDA approved in patients less than 89 years old. Performed at [facility], [address], [place], [place] [address]   Cath Tip Culture     Status: Abnormal   Collection Time: 10/02/23  9:28 PM   Specimen: Catheter Tip; Other  Result Value Ref Range Status   Specimen Description   Final    CATH TIP Performed at [facility], [address] [place], [place] [address]    Special Requests   Final    NONE Performed at [facility], [address] [place], [place] [address]    Culture >=100,000 COLONIES/mL SERRATIA MARCESCENS (A)  Final   Report Status 10/08/2023 FINAL  Final   Organism ID, Bacteria SERRATIA MARCESCENS (A)  Final      Susceptibility   Serratia marcescens - MIC*    CEFEPIME  <=0.12 SENSITIVE Sensitive     CEFTAZIDIME <=1 SENSITIVE Sensitive     CEFTRIAXONE  <=0.25 SENSITIVE Sensitive     CIPROFLOXACIN  <=0.25 SENSITIVE Sensitive     GENTAMICIN <=1 SENSITIVE Sensitive     TRIMETH /SULFA  <=20 SENSITIVE Sensitive     * >=100,000 COLONIES/mL SERRATIA MARCESCENS  Culture, blood (Routine X 2) w  Reflex to ID Panel     Status: None   Collection Time: 10/03/23  1:00 PM   Specimen: BLOOD  Result Value Ref Range Status  Specimen Description BLOOD BLOOD LEFT WRIST  Final   Special Requests   Final    BOTTLES DRAWN AEROBIC ONLY Blood Culture adequate volume   Culture   Final    NO GROWTH 5 DAYS Performed at [facility], [address] [place], [place] [address]    Report Status 10/08/2023 FINAL  Final  Culture, blood (Routine X 2) w Reflex to ID Panel     Status: None   Collection Time: 10/03/23  1:04 PM   Specimen: BLOOD  Result Value Ref Range Status   Specimen Description BLOOD BLOOD LEFT FOREARM  Final   Special Requests   Final    BOTTLES DRAWN AEROBIC ONLY Blood Culture adequate volume   Culture   Final    NO GROWTH 5 DAYS Performed at [facility], [address] [place], [place] [address]    Report Status 10/08/2023 FINAL  Final      Radiology Studies last 3 days: PERIPHERAL VASCULAR CATHETERIZATION Result Date: 10/05/2023 See surgical note for result.      Time spent: 50 min     Kaisley Stiverson, DO [facility] 10/08/2023, 2:19 PM    Dictation software may have been used to generate the above note. Typos may occur and escape review in typed/dictated notes. Please contact Dr Authur Leghorn directly for clarity if needed.  Staff may message me via secure chat in Epic  but this may not receive an immediate response,  please page me for urgent matters!  If 7PM-7AM, please contact night coverage [email or web address]

## 2023-10-09 ENCOUNTER — Inpatient Hospital Stay: Payer: Medicare (Managed Care)

## 2023-10-09 DIAGNOSIS — R7881 Bacteremia: Secondary | ICD-10-CM | POA: Diagnosis not present

## 2023-10-09 DIAGNOSIS — J9601 Acute respiratory failure with hypoxia: Secondary | ICD-10-CM | POA: Diagnosis not present

## 2023-10-09 DIAGNOSIS — I483 Typical atrial flutter: Secondary | ICD-10-CM | POA: Diagnosis not present

## 2023-10-09 DIAGNOSIS — T827XXA Infection and inflammatory reaction due to other cardiac and vascular devices, implants and grafts, initial encounter: Secondary | ICD-10-CM

## 2023-10-09 DIAGNOSIS — N186 End stage renal disease: Secondary | ICD-10-CM | POA: Diagnosis not present

## 2023-10-09 DIAGNOSIS — A488 Other specified bacterial diseases: Secondary | ICD-10-CM

## 2023-10-09 DIAGNOSIS — E1122 Type 2 diabetes mellitus with diabetic chronic kidney disease: Secondary | ICD-10-CM | POA: Diagnosis not present

## 2023-10-09 DIAGNOSIS — A419 Sepsis, unspecified organism: Secondary | ICD-10-CM | POA: Diagnosis not present

## 2023-10-09 DIAGNOSIS — Z992 Dependence on renal dialysis: Secondary | ICD-10-CM | POA: Diagnosis not present

## 2023-10-09 DIAGNOSIS — B9689 Other specified bacterial agents as the cause of diseases classified elsewhere: Secondary | ICD-10-CM | POA: Diagnosis not present

## 2023-10-09 LAB — GLUCOSE, CAPILLARY
Glucose-Capillary: 174 mg/dL — ABNORMAL HIGH (ref 70–99)
Glucose-Capillary: 137 mg/dL — ABNORMAL HIGH (ref 70–99)
Glucose-Capillary: 103 mg/dL — ABNORMAL HIGH (ref 70–99)
Glucose-Capillary: 131 mg/dL — ABNORMAL HIGH (ref 70–99)

## 2023-10-09 LAB — CBC
HCT: 28 % — ABNORMAL LOW (ref 36.0–46.0)
Hemoglobin: 9.3 g/dL — ABNORMAL LOW (ref 12.0–15.0)
MCH: 25.8 pg — ABNORMAL LOW (ref 26.0–34.0)
MCHC: 33.2 g/dL (ref 30.0–36.0)
MCV: 77.8 fL — ABNORMAL LOW (ref 80.0–100.0)
Platelets: 157 10*3/uL (ref 150–400)
RBC: 3.6 MIL/uL — ABNORMAL LOW (ref 3.87–5.11)
RDW: 17 % — ABNORMAL HIGH (ref 11.5–15.5)
WBC: 11.1 10*3/uL — ABNORMAL HIGH (ref 4.0–10.5)
nRBC: 0.2 % (ref 0.0–0.2)

## 2023-10-09 LAB — BLOOD GAS, ARTERIAL
Acid-Base Excess: 0.5 mmol/L (ref 0.0–2.0)
Bicarbonate: 24.6 mmol/L (ref 20.0–28.0)
FIO2: 0.21 %
O2 Saturation: 92.8 %
Patient temperature: 37
pCO2 arterial: 37 mmHg (ref 32–48)
pH, Arterial: 7.43 (ref 7.35–7.45)
pO2, Arterial: 59 mmHg — ABNORMAL LOW (ref 83–108)

## 2023-10-09 LAB — BASIC METABOLIC PANEL WITH GFR
Anion gap: 13 (ref 5–15)
BUN: 37 mg/dL — ABNORMAL HIGH (ref 8–23)
CO2: 23 mmol/L (ref 22–32)
Calcium: 9.1 mg/dL (ref 8.9–10.3)
Chloride: 98 mmol/L (ref 98–111)
Creatinine, Ser: 5.83 mg/dL — ABNORMAL HIGH (ref 0.44–1.00)
GFR, Estimated: 7 mL/min — ABNORMAL LOW (ref >60)
Glucose, Bld: 141 mg/dL — ABNORMAL HIGH (ref 70–99)
Potassium: 3.2 mmol/L — ABNORMAL LOW (ref 3.5–5.1)
Sodium: 134 mmol/L — ABNORMAL LOW (ref 135–145)

## 2023-10-09 MED ORDER — LEVOFLOXACIN 250 MG PO TABS
250.0000 mg | ORAL_TABLET | Freq: Every day | ORAL | Status: DC
  Filled 2023-10-09: qty 1

## 2023-10-09 MED ORDER — INSULIN ASPART 100 UNIT/ML IJ SOLN
3.0000 [IU] | Freq: Three times a day (TID) | INTRAMUSCULAR | Status: DC
  Administered 2023-10-12 (×2): 3 [IU] via SUBCUTANEOUS
  Filled 2023-10-09 (×3): qty 1

## 2023-10-09 NOTE — Progress Notes (Signed)
 Progress Note    10/09/2023 11:14 AM 4 Days Post-Op  Subjective:  Amy Wu is a 73 yo female now POD #3 from Dialysis Perma catheter placement to her left femoral vein.  No complications to note.  Patient is resting comfortably.  She endorses some soreness around the insertion site but otherwise very tolerable.  Patient endorses catheter is working well for hemodialysis at this time.  No complaints overnight.  Vitals are remained stable.   Review of Systems  Constitutional:  Constitutional negative. Respiratory: Respiratory negative.  Cardiovascular: Cardiovascular negative.  GI: Gastrointestinal negative.  GU: Genitourinary negative. Musculoskeletal: Musculoskeletal negative.  Neurological: Neurological negative. Psychiatric: Psychiatric negative.  All other systems reviewed and are negative     Vitals:   10/09/23 0234 10/09/23 0243  BP: (!) 138/54 128/78  Pulse: 79 70  Resp: 16 16  Temp: 98.3 F (36.8 C) (!) 97.5 F (36.4 C)  SpO2: 98% 95%   Physical Exam: Cardiac:  RRR, normal S1 and S2.  No rubs clicks gallops or murmurs. Lungs: Nonlabored breathing, clear throughout but diminished in the bases on auscultation.  Without rales rhonchi or wheezing. Incisions: Left femoral vein insertion site.  Dressings clean dry and intact no hematoma seroma to note. Extremities: Bilateral AKA's.  All extremities warm to touch. Abdomen: Bowel sounds throughout, soft, nontender nondistended.  Overly obese. Neurologic: Oriented x 3, neurologically intact.  CBC    Component Value Date/Time   WBC 11.1 (H) 10/09/2023 0620   RBC 3.60 (L) 10/09/2023 0620   HGB 9.3 (L) 10/09/2023 0620   HGB 12.4 12/03/2012 1414   HCT 28.0 (L) 10/09/2023 0620   HCT 38.0 12/03/2012 1414   PLT 157 10/09/2023 0620   PLT 193 12/03/2012 1414   MCV 77.8 (L) 10/09/2023 0620   MCV 81 12/03/2012 1414   MCH 25.8 (L) 10/09/2023 0620   MCHC 33.2 10/09/2023 0620   RDW 17.0 (H) 10/09/2023 0620   RDW 17.7 (H)  12/03/2012 1414   LYMPHSABS 0.7 10/01/2023 0912   MONOABS 0.8 10/01/2023 0912   EOSABS 0.0 10/01/2023 0912   BASOSABS 0.0 10/01/2023 0912    BMET    Component Value Date/Time   NA 133 (L) 10/08/2023 0827   NA 138 12/03/2012 1414   K 3.2 (L) 10/08/2023 0827   K 4.5 12/03/2012 1414   CL 98 10/08/2023 0827   CL 101 12/03/2012 1414   CO2 23 10/08/2023 0827   CO2 27 12/03/2012 1414   GLUCOSE 155 (H) 10/08/2023 0827   GLUCOSE 326 (H) 12/03/2012 1414   BUN 51 (H) 10/08/2023 0827   BUN 37 (H) 12/03/2012 1414   CREATININE 7.32 (H) 10/08/2023 0827   CREATININE 7.04 (H) 12/03/2012 1414   CALCIUM  8.6 (L) 10/08/2023 0827   CALCIUM  8.4 (L) 12/03/2012 1414   GFRNONAA 5 (L) 10/08/2023 0827   GFRNONAA 6 (L) 12/03/2012 1414   GFRAA 9 (L) 11/19/2019 1002   GFRAA 7 (L) 12/03/2012 1414    INR    Component Value Date/Time   INR 1.2 10/06/2023 2103   INR 1.0 07/26/2011 1643     Intake/Output Summary (Last 24 hours) at 10/09/2023 1114 Last data filed at 10/09/2023 0920 Gross per 24 hour  Intake 360 ml  Output 300 ml  Net 60 ml     Assessment/Plan:  73 y.o. female is s/p placement dialysis permacatheter to left femoral vein.  4 Days Post-Op   Plan Per vascular surgery okay to use catheter for hemodialysis. Pain medication as  needed Okay to discharge with dialysis primary catheter in place working well as soon as patient's medically stable.   DVT prophylaxis: Heparin  with dialysis.   [name] [facility] 10/09/2023 11:14 AM

## 2023-10-09 NOTE — Progress Notes (Signed)
 PROGRESS NOTE    Amy Wu   NFA:[id number] DOB: 1950-09-22  DOA: 10/01/2023 Date of Service: 10/09/23 which is hospital day 7  PCP: Izella Marshal, Alissa April, [facility] course / significant events:   HPI:  Amy Wu is a 73 y.o. female with medical history significant of end-stage renal disease on hemodialysis MWF, type 2 diabetes, hyperlipidemia, peripheral vascular disease presenting to ED 10/01/23 from home with generalized malaise and fevers, confusion per family, mild cough/SOB. No abdominal pain but some diarrhea. Missed hemodialysis 04/13 secondary to symptoms.   04/14: admitted to hospitalist service for sepsis as well as acute resp fail w/ hypoxia and concern for volume overload. Requiring BiPAP  04/15: (+)bacteremia w/ serratia, abx to cefepime . Pt transferred to the stepdown unit for pressors. ID consult, HD catheter removed due to likely source of sepsis. Elevated troponins, started heparin   04/16: Off levo later today and onto scheduled midodrine . Echo ordered. Cardiology consulted, continue heparin  infusion, not on beta-blocker secondary to bradycardia, scheduled for cardiac cath on 04/17. Transthoracic echo ok. Repeating BCx today. Plan reinsertion HD cath 04/18.  04/17: LHC today. Significant CAD, no PCI 04/18: L femoral dialysis cath placed, planning dialysis tomorrow. Glc substantial increase, adjusting sliding scale  04/19: New onset atrial flutter, cardiology following, started heparin  and amiodarone . Dialysis today. Still quite hyperglycemic.  04/20: converted to sinus. Son unable to get meds from pharmacy, pt will need Eliquis  - will plan dialysis tomorrow, transition to eliquis  and confirm home meds access and DME, hopefully will be able to dc tomorrow  04/21: hospital bed will be delivered tomorrow and [facility] can transport in AM, anticipate home tomorrow.  04/22: significant confusion this morning, oriented but "not right." Workup negative, exam not concerning for  CVA, CT nonacute, changing cefepime  to levaquin  and hopefully this will help.    Consultants:  Infectious disease Vascular surgery  [facility] Cardiology  Procedures/Surgeries: 10/04/23 left heart cath   10/05/23 placement permcath - tunneled hemodialysis catheter via the left femoral vein       ASSESSMENT & PLAN:   Metabolic encephalopathy likely effect of cefepime  Likely component of vascular dementia  Changing to levaquin  per ID recs, start tomorrow   Severe Sepsis w/ Serratia bacteremia likely source HD catheter which has been removed Complicated by septic shock, see below  s/p HD catheter placement 10/05/23 following neg repeat BCx Cefepime  changed to levaquin  per ID  Septic shock requiring vasopressor support, shock resolved and now off IV pressors Treat underlying cause(s) as noted midodrine   New atrial flutter New onset this morning 04/19 Documented on EKG today Rate controlled in the 70s Asymptomatic amiodarone  200 twice daily x 2 weeks then down to 200 daily   Eliquis    Elevated troponin secondary NSTEMI vs. demand ischemia  Coronary Artery Disease w/ extensive calcification / atherosclerotic disease  Hx: HTN, HLD and PVD Echo 10/02/23: EF >55%; mild tricuspid valve regurgitation; trivial pulmonic valve regurgitation  Cardiac catheterization was attempted 10/04/23 but was very challenging due to difficult vascular anatomy. Only the right coronary artery could be engaged and was found to have sequential 80 to 90% lesions.  No ASA on Eliquis  atorvastatin   Beta blocker has caused bradycardia/hypotension, defer this medication    Acute hypoxic respiratory failure - improved  Likely d/t pulmonary vascular congestion d/t missed dialysis Supplemental O2 for dyspnea and/or hypoxia  Maintain O2 sats 92% or higher  Fluid removal with HD     ESRD on HD (M-W-F) Hyponatremia  s/p HD catheter placement 10/05/23 Trend BMP per nephrology / as needed  Replace electrolytes  as indicated  Nephrology consulted appreciate input   Elevated AST  Trend hepatic function panel  Avoid hepatotoxic agents as able    Type II diabetes mellitus  PT had previously been d/c insulin  outpatient d/t severe hypoglycemia, was normoglycemic initially and A1C <7 however Glc past few days has been quite high, diet has been non-carb-controlled, question d/t steroid dose, has been coming down  CBG's ac/hs/prn diet to renal/carb   SSI decreased  Target CBG's 140 to 180     Class 3 obesity based on BMI: Body mass index is 47.66 kg/m.  Underweight - under 18  overweight - 25 to 29 obese - 30 or more Class 1 obesity: BMI of 30.0 to 34 Class 2 obesity: BMI of 35.0 to 39 Class 3 obesity: BMI of 40.0 to 49 Super Morbid Obesity: BMI 50-59 Super-super Morbid Obesity: BMI 60+ Significantly low or high BMI is associated with higher medical risk.  Weight management advised as adjunct to other disease management and risk reduction treatments    DVT prophylaxis: on heparin  gtt  IV fluids: no continuous IV fluids  Nutrition: NPO pending cardiac cath Central lines / other devices: none  Code Status: DNR ACP documentation reviewed:  none on file in [facility]  TOC needs: [facility] transport, DME  Medical barriers to dispo: encephalopathic this morning, expect dc tomorrow if mentally a bit more clear / after HD and once DME in place and [facility] can transort home             Subjective / Brief ROS:  Seen this morning  Family concerned about "she's not right" her speech is clear but she seems confused and speech is slow No complaint of weakness Denies CP/SOB.  Pain controlled.   Family Communication: son and other fmaily members at bedside     Objective Findings:  Vitals:   10/09/23 0234 10/09/23 0235 10/09/23 0243 10/09/23 1700  BP: (!) 138/54  128/78 (!) 146/72  Pulse: 79  70 81  Resp: 16  16 16   Temp: 98.3 F (36.8 C)  (!) 97.5 F (36.4 C) 98.7 F (37.1 C)  TempSrc:     Oral  SpO2: 98%  95% 98%  Weight:  97.8 kg    Height:        Intake/Output Summary (Last 24 hours) at 10/09/2023 1705 Last data filed at 10/09/2023 1500 Gross per 24 hour  Intake 580 ml  Output --  Net 580 ml   Filed Weights   10/08/23 0810 10/08/23 1330 10/09/23 0235  Weight: 96.6 kg 96.3 kg 97.8 kg    Examination:  Physical Exam Constitutional:      General: She is not in acute distress. Cardiovascular:     Rate and Rhythm: Normal rate and regular rhythm.  Pulmonary:     Effort: Pulmonary effort is normal.     Breath sounds: Normal breath sounds.  Abdominal:     Palpations: Abdomen is soft.  Skin:    General: Skin is warm and dry.  Neurological:     General: No focal deficit present.     Mental Status: She is alert. She is confused.     Cranial Nerves: No cranial nerve deficit.     Sensory: Sensation is intact.     Motor: Motor function is intact. No weakness.     Coordination: Coordination is intact.     Comments: Alert, speaking clearly but  slowly/hesitation between words, tangential but coherent. Oriented to place, not sure of the year (baseline)  Psychiatric:        Mood and Affect: Mood normal.        Behavior: Behavior normal.          Scheduled Medications:   amiodarone   200 mg Oral BID   apixaban   5 mg Oral BID   atorvastatin   80 mg Oral Daily   Chlorhexidine  Gluconate Cloth  6 each Topical Q0600   insulin  aspart  0-15 Units Subcutaneous TID WC   insulin  aspart  0-5 Units Subcutaneous QHS   [START ON 10/10/2023] insulin  aspart  3 Units Subcutaneous TID WC   insulin  glargine-yfgn  5 Units Subcutaneous Q2200   [START ON 10/10/2023] levofloxacin   250 mg Oral QHS    Continuous Infusions:    PRN Medications:  acetaminophen , gabapentin , Gerhardt's butt cream, ondansetron  **OR** ondansetron  (ZOFRAN ) IV  Antimicrobials from admission:  Anti-infectives (From admission, onward)    Start     Dose/Rate Route Frequency Ordered Stop   10/10/23 2200   levofloxacin  (LEVAQUIN ) tablet 250 mg        250 mg Oral Daily at bedtime 10/09/23 1559 10/18/23 2159   10/10/23 1200  ceFEPIme  (MAXIPIME ) 2 g in sodium chloride  0.9 % 100 mL IVPB  Status:  Discontinued        2 g 200 mL/hr over 30 Minutes Intravenous Every M-W-F (Hemodialysis) 10/08/23 1417 10/09/23 1559   10/10/23 0000  ceFEPime  (MAXIPIME ) IVPB  Status:  Discontinued        2 g Intravenous Every M-W-F (Hemodialysis) 10/08/23 1416 10/09/23    10/08/23 1600  ceFEPIme  (MAXIPIME ) 2 g in sodium chloride  0.9 % 100 mL IVPB       Placed in "Followed by" Linked Group   2 g 200 mL/hr over 30 Minutes Intravenous  Once 10/08/23 1417 10/08/23 1845   10/05/23 1021  vancomycin  (VANCOCIN ) IVPB 1000 mg/200 mL premix        1,000 mg 200 mL/hr over 60 Minutes Intravenous 60 min pre-op 10/05/23 1021 10/05/23 1214   10/03/23 1000  levofloxacin  (LEVAQUIN ) IVPB 500 mg  Status:  Discontinued        500 mg 100 mL/hr over 60 Minutes Intravenous Every 48 hours 10/01/23 1445 10/01/23 1458   10/03/23 1000  cefTRIAXone  (ROCEPHIN ) 2 g in sodium chloride  0.9 % 100 mL IVPB  Status:  Discontinued        2 g 200 mL/hr over 30 Minutes Intravenous Every 24 hours 10/01/23 1459 10/02/23 0550   10/02/23 2200  ceFEPIme  (MAXIPIME ) 1 g in sodium chloride  0.9 % 100 mL IVPB  Status:  Discontinued       Placed in "Followed by" Linked Group   1 g 200 mL/hr over 30 Minutes Intravenous Every 24 hours 10/02/23 0550 10/08/23 1417   10/02/23 1000  azithromycin  (ZITHROMAX ) 500 mg in sodium chloride  0.9 % 250 mL IVPB  Status:  Discontinued        500 mg 250 mL/hr over 60 Minutes Intravenous Daily 10/01/23 1459 10/02/23 1553   10/02/23 0645  ceFEPIme  (MAXIPIME ) 1 g in sodium chloride  0.9 % 100 mL IVPB       Placed in "Followed by" Linked Group   1 g 200 mL/hr over 30 Minutes Intravenous  Once 10/02/23 0550 10/02/23 0717   10/01/23 0915  vancomycin  (VANCOCIN ) IVPB 1000 mg/200 mL premix        1,000 mg 200 mL/hr over 60 Minutes  Intravenous  Once 10/01/23 0913 10/01/23 1058   10/01/23 0915  levofloxacin  (LEVAQUIN ) IVPB 750 mg        750 mg 100 mL/hr over 90 Minutes Intravenous  Once 10/01/23 0913 10/01/23 1807           Data Reviewed:  I have personally reviewed the following...  CBC: Recent Labs  Lab 10/06/23 0511 10/07/23 0153 10/08/23 0551 10/08/23 0827 10/09/23 0620  WBC 10.7* 11.1* 12.4* 11.8* 11.1*  HGB 10.6* 10.9* 10.3* 10.1* 9.3*  HCT 30.9* 32.5* 30.6* 30.1* 28.0*  MCV 74.8* 77.4* 76.7* 77.0* 77.8*  PLT 181 159 164 165 157   Basic Metabolic Panel: Recent Labs  Lab 10/03/23 0222 10/04/23 0441 10/05/23 0556 10/06/23 0511 10/07/23 0153 10/08/23 0827 10/09/23 1057  NA 133* 133* 133* 132* 134* 133* 134*  K 3.3* 3.3* 4.4 4.1 3.5 3.2* 3.2*  CL 97* 98 98 97* 96* 98 98  CO2 21* 23 21* 21* 24 23 23   GLUCOSE 148* 155* 318* 414* 288* 155* 141*  BUN 32* 40* 52* 60* 40* 51* 37*  CREATININE 6.23* 7.42* 8.34* 9.06* 6.13* 7.32* 5.83*  CALCIUM  8.3* 8.2* 8.6* 8.7* 8.8* 8.6* 9.1  MG 1.9 2.1  --   --   --   --   --   PHOS 4.0 4.3  --   --   --  3.8  --    GFR: Estimated Creatinine Clearance: 8.6 mL/min (A) (by C-G formula based on SCr of 5.83 mg/dL (H)). Liver Function Tests: Recent Labs  Lab 10/08/23 0827  ALBUMIN 2.5*   No results for input(s): "LIPASE", "AMYLASE" in the last 168 hours. No results for input(s): "AMMONIA" in the last 168 hours.  Coagulation Profile: Recent Labs  Lab 10/03/23 1018 10/06/23 2103  INR 1.2 1.2   Cardiac Enzymes: No results for input(s): "CKTOTAL", "CKMB", "CKMBINDEX", "TROPONINI" in the last 168 hours. BNP (last 3 results) No results for input(s): "PROBNP" in the last 8760 hours. HbA1C: No results for input(s): "HGBA1C" in the last 72 hours.  CBG: Recent Labs  Lab 10/07/23 2233 10/08/23 1807 10/08/23 1954 10/09/23 0920 10/09/23 1229  GLUCAP 149* 205* 168* 174* 137*   Lipid Profile: No results for input(s): "CHOL", "HDL", "LDLCALC",  "TRIG", "CHOLHDL", "LDLDIRECT" in the last 72 hours.  Thyroid Function Tests: No results for input(s): "TSH", "T4TOTAL", "FREET4", "T3FREE", "THYROIDAB" in the last 72 hours. Anemia Panel: No results for input(s): "VITAMINB12", "FOLATE", "FERRITIN", "TIBC", "IRON", "RETICCTPCT" in the last 72 hours. Most Recent Urinalysis On File:     Component Value Date/Time   COLORURINE Yellow 12/28/2011 1746   APPEARANCEUR Turbid 12/28/2011 1746   LABSPEC 1.010 12/28/2011 1746   PHURINE 8.0 12/28/2011 1746   GLUCOSEU >=500 12/28/2011 1746   HGBUR 2+ 12/28/2011 1746   BILIRUBINUR Negative 12/28/2011 1746   KETONESUR Negative 12/28/2011 1746   PROTEINUR 100 mg/dL 42/59/5638 7564   NITRITE Negative 12/28/2011 1746   LEUKOCYTESUR 3+ 12/28/2011 1746   Sepsis Labs: @LABRCNTIP (procalcitonin:4,lacticidven:4) Microbiology: Recent Results (from the past 240 hours)  Resp panel by RT-PCR (RSV, Flu A&B, Covid) Anterior Nasal Swab     Status: None   Collection Time: 10/01/23  9:12 AM   Specimen: Anterior Nasal Swab  Result Value Ref Range Status   SARS Coronavirus 2 by RT PCR NEGATIVE NEGATIVE Final    Comment: (NOTE) SARS-CoV-2 target nucleic acids are NOT DETECTED.  The SARS-CoV-2 RNA is generally detectable in upper respiratory specimens during the acute phase of infection. The lowest  concentration of SARS-CoV-2 viral copies this assay can detect is 138 copies/mL. A negative result does not preclude SARS-Cov-2 infection and should not be used as the sole basis for treatment or other patient management decisions. A negative result may occur with  improper specimen collection/handling, submission of specimen other than nasopharyngeal swab, presence of viral mutation(s) within the areas targeted by this assay, and inadequate number of viral copies(<138 copies/mL). A negative result must be combined with clinical observations, patient history, and epidemiological information. The expected result is  Negative.  Fact Sheet for Patients:  BloggerCourse.com  Fact Sheet for Healthcare Providers:  SeriousBroker.it  This test is no t yet approved or cleared by the [place]  FDA and  has been authorized for detection and/or diagnosis of SARS-CoV-2 by FDA under an Emergency Use Authorization (EUA). This EUA will remain  in effect (meaning this test can be used) for the duration of the COVID-19 declaration under Section 564(b)(1) of the Act, 21 U.S.C.section 360bbb-3(b)(1), unless the authorization is terminated  or revoked sooner.       Influenza A by PCR NEGATIVE NEGATIVE Final   Influenza B by PCR NEGATIVE NEGATIVE Final    Comment: (NOTE) The Xpert Xpress SARS-CoV-2/FLU/RSV plus assay is intended as an aid in the diagnosis of influenza from Nasopharyngeal swab specimens and should not be used as a sole basis for treatment. Nasal washings and aspirates are unacceptable for Xpert Xpress SARS-CoV-2/FLU/RSV testing.  Fact Sheet for Patients: BloggerCourse.com  Fact Sheet for Healthcare Providers: SeriousBroker.it  This test is not yet approved or cleared by the [place]  FDA and has been authorized for detection and/or diagnosis of SARS-CoV-2 by FDA under an Emergency Use Authorization (EUA). This EUA will remain in effect (meaning this test can be used) for the duration of the COVID-19 declaration under Section 564(b)(1) of the Act, 21 U.S.C. section 360bbb-3(b)(1), unless the authorization is terminated or revoked.     Resp Syncytial Virus by PCR NEGATIVE NEGATIVE Final    Comment: (NOTE) Fact Sheet for Patients: BloggerCourse.com  Fact Sheet for Healthcare Providers: SeriousBroker.it  This test is not yet approved or cleared by the [place]  FDA and has been authorized for detection and/or diagnosis of  SARS-CoV-2 by FDA under an Emergency Use Authorization (EUA). This EUA will remain in effect (meaning this test can be used) for the duration of the COVID-19 declaration under Section 564(b)(1) of the Act, 21 U.S.C. section 360bbb-3(b)(1), unless the authorization is terminated or revoked.  Performed at [facility], [address], [place], [place] [address]   Blood Culture (routine x 2)     Status: Abnormal   Collection Time: 10/01/23  9:12 AM   Specimen: BLOOD  Result Value Ref Range Status   Specimen Description   Final    BLOOD BLOOD LEFT FOREARM Performed at [facility], [address], [place], [place] [address]    Special Requests   Final    BOTTLES DRAWN AEROBIC AND ANAEROBIC Blood Culture results may not be optimal due to an inadequate volume of blood received in culture bottles Performed at [facility], [address], [place], [place] [address]    Culture  Setup Time   Final    GRAM NEGATIVE RODS IN BOTH AEROBIC AND ANAEROBIC BOTTLES CRITICAL VALUE NOTED.  VALUE IS CONSISTENT WITH PREVIOUSLY REPORTED AND CALLED VALUE. Performed at [facility], [address], [place], [place] [address]    Culture (A)  Final    SERRATIA  MARCESCENS SUSCEPTIBILITIES PERFORMED ON PREVIOUS CULTURE WITHIN THE LAST 5 DAYS. Performed at [facility], [address], [place], [place] [address]    Report Status 10/04/2023 FINAL  Final  Respiratory (~20 pathogens) panel by PCR     Status: None   Collection Time: 10/01/23  9:12 AM   Specimen: Nasopharyngeal Swab; Respiratory  Result Value Ref Range Status   Adenovirus NOT DETECTED NOT DETECTED Final   Coronavirus 229E NOT DETECTED NOT DETECTED Final    Comment: (NOTE) The Coronavirus on the Respiratory Panel, DOES NOT test for the novel  Coronavirus (2019 nCoV)    Coronavirus HKU1 NOT DETECTED NOT DETECTED Final   Coronavirus NL63 NOT DETECTED NOT DETECTED Final   Coronavirus OC43 NOT  DETECTED NOT DETECTED Final   Metapneumovirus NOT DETECTED NOT DETECTED Final   Rhinovirus / Enterovirus NOT DETECTED NOT DETECTED Final   Influenza A NOT DETECTED NOT DETECTED Final   Influenza B NOT DETECTED NOT DETECTED Final   Parainfluenza Virus 1 NOT DETECTED NOT DETECTED Final   Parainfluenza Virus 2 NOT DETECTED NOT DETECTED Final   Parainfluenza Virus 3 NOT DETECTED NOT DETECTED Final   Parainfluenza Virus 4 NOT DETECTED NOT DETECTED Final   Respiratory Syncytial Virus NOT DETECTED NOT DETECTED Final   Bordetella pertussis NOT DETECTED NOT DETECTED Final   Bordetella Parapertussis NOT DETECTED NOT DETECTED Final   Chlamydophila pneumoniae NOT DETECTED NOT DETECTED Final   Mycoplasma pneumoniae NOT DETECTED NOT DETECTED Final    Comment: Performed at [facility], [address], [place], [place] [address]  Blood Culture (routine x 2)     Status: Abnormal   Collection Time: 10/01/23  9:33 AM   Specimen: BLOOD  Result Value Ref Range Status   Specimen Description   Final    BLOOD RIGHT WRIST Performed at [facility], [address], [place], [place] [address]    Special Requests   Final    BOTTLES DRAWN AEROBIC AND ANAEROBIC Blood Culture adequate volume Performed at [facility], [address] [place], [place] [address]    Culture  Setup Time   Final    GRAM NEGATIVE RODS IN BOTH AEROBIC AND ANAEROBIC BOTTLES CRITICAL RESULT CALLED TO, READ BACK BY AND VERIFIED WITH:  NATHAN BELUE AT 0457 10/02/23 JG Performed at [facility], [address] [place], [place] [address]    Culture SERRATIA MARCESCENS (A)  Final   Report Status 10/04/2023 FINAL  Final   Organism ID, Bacteria SERRATIA MARCESCENS  Final      Susceptibility   Serratia marcescens - MIC*    CEFEPIME  <=0.12 SENSITIVE Sensitive     CEFTAZIDIME <=1 SENSITIVE Sensitive     CEFTRIAXONE  <=0.25 SENSITIVE Sensitive     CIPROFLOXACIN  <=0.25 SENSITIVE Sensitive     GENTAMICIN <=1  SENSITIVE Sensitive     TRIMETH /SULFA  <=20 SENSITIVE Sensitive     * SERRATIA MARCESCENS  Blood Culture ID Panel (Reflexed)     Status: Abnormal   Collection Time: 10/01/23  9:33 AM  Result Value Ref Range Status   Enterococcus faecalis NOT DETECTED NOT DETECTED Final   Enterococcus Faecium NOT DETECTED NOT DETECTED Final   Listeria monocytogenes NOT DETECTED NOT DETECTED Final   Staphylococcus species NOT DETECTED NOT DETECTED Final   Staphylococcus aureus (BCID) NOT DETECTED NOT DETECTED Final   Staphylococcus epidermidis NOT DETECTED NOT DETECTED Final   Staphylococcus lugdunensis NOT DETECTED NOT DETECTED Final   Streptococcus species NOT DETECTED  NOT DETECTED Final   Streptococcus agalactiae NOT DETECTED NOT DETECTED Final   Streptococcus pneumoniae NOT DETECTED NOT DETECTED Final   Streptococcus pyogenes NOT DETECTED NOT DETECTED Final   A.calcoaceticus-baumannii NOT DETECTED NOT DETECTED Final   Bacteroides fragilis NOT DETECTED NOT DETECTED Final   Enterobacterales DETECTED (A) NOT DETECTED Final    Comment: Enterobacterales represent a large order of gram negative bacteria, not a single organism. CRITICAL RESULT CALLED TO, READ BACK BY AND VERIFIED WITH:  NATHAN BELUE AT 0457 10/02/23 JG    Enterobacter cloacae complex NOT DETECTED NOT DETECTED Final   Escherichia coli NOT DETECTED NOT DETECTED Final   Klebsiella aerogenes NOT DETECTED NOT DETECTED Final   Klebsiella oxytoca NOT DETECTED NOT DETECTED Final   Klebsiella pneumoniae NOT DETECTED NOT DETECTED Final   Proteus species NOT DETECTED NOT DETECTED Final   Salmonella species NOT DETECTED NOT DETECTED Final   Serratia marcescens DETECTED (A) NOT DETECTED Final    Comment: CRITICAL RESULT CALLED TO, READ BACK BY AND VERIFIED WITH:  NATHAN BELUE AT 0457 10/02/23 JG    Haemophilus influenzae NOT DETECTED NOT DETECTED Final   Neisseria meningitidis NOT DETECTED NOT DETECTED Final   Pseudomonas aeruginosa NOT DETECTED NOT  DETECTED Final   Stenotrophomonas maltophilia NOT DETECTED NOT DETECTED Final   Candida albicans NOT DETECTED NOT DETECTED Final   Candida auris NOT DETECTED NOT DETECTED Final   Candida glabrata NOT DETECTED NOT DETECTED Final   Candida krusei NOT DETECTED NOT DETECTED Final   Candida parapsilosis NOT DETECTED NOT DETECTED Final   Candida tropicalis NOT DETECTED NOT DETECTED Final   Cryptococcus neoformans/gattii NOT DETECTED NOT DETECTED Final   CTX-M ESBL NOT DETECTED NOT DETECTED Final   Carbapenem resistance IMP NOT DETECTED NOT DETECTED Final   Carbapenem resistance KPC NOT DETECTED NOT DETECTED Final   Carbapenem resistance NDM NOT DETECTED NOT DETECTED Final   Carbapenem resist OXA 48 LIKE NOT DETECTED NOT DETECTED Final   Carbapenem resistance VIM NOT DETECTED NOT DETECTED Final    Comment: Performed at [facility], [address] [place], [place] [address]  MRSA Next Gen by PCR, Nasal     Status: None   Collection Time: 10/02/23  5:26 PM   Specimen: Nasal Mucosa; Nasal Swab  Result Value Ref Range Status   MRSA by PCR Next Gen NOT DETECTED NOT DETECTED Final    Comment: (NOTE) The GeneXpert MRSA Assay (FDA approved for NASAL specimens only), is one component of a comprehensive MRSA colonization surveillance program. It is not intended to diagnose MRSA infection nor to guide or monitor treatment for MRSA infections. Test performance is not FDA approved in patients less than 88 years old. Performed at [facility], [address] [place], [place] [address]   Cath Tip Culture     Status: Abnormal   Collection Time: 10/02/23  9:28 PM   Specimen: Catheter Tip; Other  Result Value Ref Range Status   Specimen Description   Final    CATH TIP Performed at [facility], [address] [place], [place] [address]    Special Requests   Final    NONE Performed at [facility], [address] [place], [place] [address]    Culture  >=100,000 COLONIES/mL SERRATIA MARCESCENS (A)  Final   Report Status 10/08/2023 FINAL  Final   Organism ID, Bacteria SERRATIA MARCESCENS (A)  Final      Susceptibility   Serratia marcescens - MIC*    CEFEPIME  <=  0.12 SENSITIVE Sensitive     CEFTAZIDIME <=1 SENSITIVE Sensitive     CEFTRIAXONE  <=0.25 SENSITIVE Sensitive     CIPROFLOXACIN  <=0.25 SENSITIVE Sensitive     GENTAMICIN <=1 SENSITIVE Sensitive     TRIMETH /SULFA  <=20 SENSITIVE Sensitive     * >=100,000 COLONIES/mL SERRATIA MARCESCENS  Culture, blood (Routine X 2) w Reflex to ID Panel     Status: None   Collection Time: 10/03/23  1:00 PM   Specimen: BLOOD  Result Value Ref Range Status   Specimen Description BLOOD BLOOD LEFT WRIST  Final   Special Requests   Final    BOTTLES DRAWN AEROBIC ONLY Blood Culture adequate volume   Culture   Final    NO GROWTH 5 DAYS Performed at [facility], [address], [place], [place] [address]    Report Status 10/08/2023 FINAL  Final  Culture, blood (Routine X 2) w Reflex to ID Panel     Status: None   Collection Time: 10/03/23  1:04 PM   Specimen: BLOOD  Result Value Ref Range Status   Specimen Description BLOOD BLOOD LEFT FOREARM  Final   Special Requests   Final    BOTTLES DRAWN AEROBIC ONLY Blood Culture adequate volume   Culture   Final    NO GROWTH 5 DAYS Performed at [facility], [address] [place], [place] [address]    Report Status 10/08/2023 FINAL  Final      Radiology Studies last 3 days: CT HEAD WO CONTRAST ( ) Result Date: 10/09/2023 CLINICAL DATA:  73 year old female with altered mental status. EXAM: CT HEAD WITHOUT CONTRAST TECHNIQUE: Contiguous axial images were obtained from the base of the skull through the vertex without intravenous contrast. RADIATION DOSE REDUCTION: This exam was performed according to the departmental dose-optimization program which includes automated exposure control, adjustment of the mA and/or kV according to patient  size and/or use of iterative reconstruction technique. COMPARISON:  Brain MRI 05/24/2021.  Head CT 05/23/2021. FINDINGS: Brain: No midline shift, ventriculomegaly, mass effect, evidence of mass lesion, intracranial hemorrhage or evidence of cortically based acute infarction. Basal ganglia vascular calcifications. Small chronic bilateral cerebellar infarcts are stable from the 2022 MRI. Patchy additional bilateral cerebral white matter hypodensity. Vascular: Severe Calcified atherosclerosis at the skull base. No suspicious intracranial vascular hyperdensity. Skull: Intact.  No acute osseous abnormality identified. Sinuses/Orbits: Visualized paranasal sinuses and mastoids are stable and well aerated. Other: Calcified scalp vessel atherosclerosis. Postoperative changes to both globes. No acute orbit or scalp soft tissue finding. IMPRESSION: 1. No acute intracranial abnormality. 2. Small chronic cerebellar infarcts, chronic cerebral white matter changes, and Severe calcified atherosclerosis. Electronically Signed   By: Marlise Simpers M.D.   On: 10/09/2023 16:45       Time spent: 50 min     Melodi Sprung, DO [facility] 10/09/2023, 5:05 PM    Dictation software may have been used to generate the above note. Typos may occur and escape review in typed/dictated notes. Please contact Dr Authur Leghorn directly for clarity if needed.  Staff may message me via secure chat in Epic  but this may not receive an immediate response,  please page me for urgent matters!  If 7PM-7AM, please contact night coverage [email or web address]

## 2023-10-09 NOTE — Progress Notes (Signed)
 [facility]  ROUNDING NOTE   Subjective:   Amy Wu  is a 73 y.o.  female with past medical history of hypertension, diabetes, PVD, dyslipidemia, and end-stage renal disease on dialysis. She presents to the ED via EMS for evaluation of altered mental status and fever and has been admitted under observation for Acute respiratory failure with hypoxia (HCC) [J96.01] Sepsis (HCC) [A41.9] Sepsis, due to unspecified organism, unspecified whether acute organ dysfunction present [facility]) [A41.9]  Patient is known to our practice and receives outpatient dialysis treatments at [facility] on a MWF schedule, supervised by [facility] physicians.   Update: Patient appears in good spirits today Awaiting breakfast Denies shortness of breath   Objective:  Vital signs in last 24 hours:  Temp:  [97.5 F (36.4 C)-98.3 F (36.8 C)] 97.5 F (36.4 C) (04/22 0243) Pulse Rate:  [70-84] 70 (04/22 0243) Resp:  [14-24] 16 (04/22 0243) BP: (105-148)/(54-82) 128/78 (04/22 0243) SpO2:  [95 %-100 %] 95 % (04/22 0243) Weight:  [96.3 kg-97.8 kg] 97.8 kg (04/22 0235)  Weight change: -4 kg Filed Weights   10/08/23 0810 10/08/23 1330 10/09/23 0235  Weight: 96.6 kg 96.3 kg 97.8 kg    Intake/Output: I/O last 3 completed shifts: In: 0  Out: 300 [Other:300]   Intake/Output this shift:  Total I/O In: 360 [P.O.:360] Out: -   Physical Exam: General: NAD  Head: Normocephalic, atraumatic.   Eyes: Anicteric  Lungs:  Diminished, Leonard O2  Heart: Regular rate and rhythm  Abdomen:  Soft, nontender  Extremities: Trace Rt stump peripheral edema. Bilat AKA  Neurologic: Alert, moving all four extremities  Skin: No lesions  Access: Lt thigh permcath placed 4/18    Basic Metabolic Panel: Recent Labs  Lab 10/03/23 0222 10/04/23 0441 10/05/23 0556 10/06/23 0511 10/07/23 0153 10/08/23 0827 10/09/23 1057  NA 133* 133* 133* 132* 134* 133* 134*  K 3.3* 3.3* 4.4 4.1 3.5 3.2* 3.2*  CL 97* 98 98  97* 96* 98 98  CO2 21* 23 21* 21* 24 23 23   GLUCOSE 148* 155* 318* 414* 288* 155* 141*  BUN 32* 40* 52* 60* 40* 51* 37*  CREATININE 6.23* 7.42* 8.34* 9.06* 6.13* 7.32* 5.83*  CALCIUM  8.3* 8.2* 8.6* 8.7* 8.8* 8.6* 9.1  MG 1.9 2.1  --   --   --   --   --   PHOS 4.0 4.3  --   --   --  3.8  --     Liver Function Tests: Recent Labs  Lab 10/08/23 0827  ALBUMIN 2.5*   No results for input(s): "LIPASE", "AMYLASE" in the last 168 hours. No results for input(s): "AMMONIA" in the last 168 hours.   CBC: Recent Labs  Lab 10/06/23 0511 10/07/23 0153 10/08/23 0551 10/08/23 0827 10/09/23 0620  WBC 10.7* 11.1* 12.4* 11.8* 11.1*  HGB 10.6* 10.9* 10.3* 10.1* 9.3*  HCT 30.9* 32.5* 30.6* 30.1* 28.0*  MCV 74.8* 77.4* 76.7* 77.0* 77.8*  PLT 181 159 164 165 157    Cardiac Enzymes: No results for input(s): "CKTOTAL", "CKMB", "CKMBINDEX", "TROPONINI" in the last 168 hours.  BNP: Invalid input(s): "POCBNP"  CBG: Recent Labs  Lab 10/07/23 1558 10/07/23 2233 10/08/23 1807 10/08/23 1954 10/09/23 0920  GLUCAP 274* 149* 205* 168* 174*    Microbiology: Results for orders placed or performed during the hospital encounter of 10/01/23  Resp panel by RT-PCR (RSV, Flu A&B, Covid) Anterior Nasal Swab     Status: None   Collection Time: 10/01/23  9:12 AM   Specimen: Anterior Nasal Swab  Result Value Ref Range Status   SARS Coronavirus 2 by RT PCR NEGATIVE NEGATIVE Final    Comment: (NOTE) SARS-CoV-2 target nucleic acids are NOT DETECTED.  The SARS-CoV-2 RNA is generally detectable in upper respiratory specimens during the acute phase of infection. The lowest concentration of SARS-CoV-2 viral copies this assay can detect is 138 copies/mL. A negative result does not preclude SARS-Cov-2 infection and should not be used as the sole basis for treatment or other patient management decisions. A negative result may occur with  improper specimen collection/handling, submission of specimen  other than nasopharyngeal swab, presence of viral mutation(s) within the areas targeted by this assay, and inadequate number of viral copies(<138 copies/mL). A negative result must be combined with clinical observations, patient history, and epidemiological information. The expected result is Negative.  Fact Sheet for Patients:  BloggerCourse.com  Fact Sheet for Healthcare Providers:  SeriousBroker.it  This test is no t yet approved or cleared by the [place]  FDA and  has been authorized for detection and/or diagnosis of SARS-CoV-2 by FDA under an Emergency Use Authorization (EUA). This EUA will remain  in effect (meaning this test can be used) for the duration of the COVID-19 declaration under Section 564(b)(1) of the Act, 21 U.S.C.section 360bbb-3(b)(1), unless the authorization is terminated  or revoked sooner.       Influenza A by PCR NEGATIVE NEGATIVE Final   Influenza B by PCR NEGATIVE NEGATIVE Final    Comment: (NOTE) The Xpert Xpress SARS-CoV-2/FLU/RSV plus assay is intended as an aid in the diagnosis of influenza from Nasopharyngeal swab specimens and should not be used as a sole basis for treatment. Nasal washings and aspirates are unacceptable for Xpert Xpress SARS-CoV-2/FLU/RSV testing.  Fact Sheet for Patients: BloggerCourse.com  Fact Sheet for Healthcare Providers: SeriousBroker.it  This test is not yet approved or cleared by the [place]  FDA and has been authorized for detection and/or diagnosis of SARS-CoV-2 by FDA under an Emergency Use Authorization (EUA). This EUA will remain in effect (meaning this test can be used) for the duration of the COVID-19 declaration under Section 564(b)(1) of the Act, 21 U.S.C. section 360bbb-3(b)(1), unless the authorization is terminated or revoked.     Resp Syncytial Virus by PCR NEGATIVE NEGATIVE Final     Comment: (NOTE) Fact Sheet for Patients: BloggerCourse.com  Fact Sheet for Healthcare Providers: SeriousBroker.it  This test is not yet approved or cleared by the [place]  FDA and has been authorized for detection and/or diagnosis of SARS-CoV-2 by FDA under an Emergency Use Authorization (EUA). This EUA will remain in effect (meaning this test can be used) for the duration of the COVID-19 declaration under Section 564(b)(1) of the Act, 21 U.S.C. section 360bbb-3(b)(1), unless the authorization is terminated or revoked.  Performed at [facility], [address] [place], [place] [address]   Blood Culture (routine x 2)     Status: Abnormal   Collection Time: 10/01/23  9:12 AM   Specimen: BLOOD  Result Value Ref Range Status   Specimen Description   Final    BLOOD BLOOD LEFT FOREARM Performed at [facility], [address], [place], [place] [address]    Special Requests   Final    BOTTLES DRAWN AEROBIC AND ANAEROBIC Blood Culture results may not be optimal due to an inadequate volume of blood received in culture bottles Performed at [facility], [address], [place], [place] [address]  Culture  Setup Time   Final    GRAM NEGATIVE RODS IN BOTH AEROBIC AND ANAEROBIC BOTTLES CRITICAL VALUE NOTED.  VALUE IS CONSISTENT WITH PREVIOUSLY REPORTED AND CALLED VALUE. Performed at [facility], [address] [place], [place] [address]    Culture (A)  Final    SERRATIA MARCESCENS SUSCEPTIBILITIES PERFORMED ON PREVIOUS CULTURE WITHIN THE LAST 5 DAYS. Performed at [facility], [address] [place], [place] [address]    Report Status 10/04/2023 FINAL  Final  Respiratory (~20 pathogens) panel by PCR     Status: None   Collection Time: 10/01/23  9:12 AM   Specimen: Nasopharyngeal Swab; Respiratory  Result Value Ref Range Status   Adenovirus NOT DETECTED NOT DETECTED Final    Coronavirus 229E NOT DETECTED NOT DETECTED Final    Comment: (NOTE) The Coronavirus on the Respiratory Panel, DOES NOT test for the novel  Coronavirus (2019 nCoV)    Coronavirus HKU1 NOT DETECTED NOT DETECTED Final   Coronavirus NL63 NOT DETECTED NOT DETECTED Final   Coronavirus OC43 NOT DETECTED NOT DETECTED Final   Metapneumovirus NOT DETECTED NOT DETECTED Final   Rhinovirus / Enterovirus NOT DETECTED NOT DETECTED Final   Influenza A NOT DETECTED NOT DETECTED Final   Influenza B NOT DETECTED NOT DETECTED Final   Parainfluenza Virus 1 NOT DETECTED NOT DETECTED Final   Parainfluenza Virus 2 NOT DETECTED NOT DETECTED Final   Parainfluenza Virus 3 NOT DETECTED NOT DETECTED Final   Parainfluenza Virus 4 NOT DETECTED NOT DETECTED Final   Respiratory Syncytial Virus NOT DETECTED NOT DETECTED Final   Bordetella pertussis NOT DETECTED NOT DETECTED Final   Bordetella Parapertussis NOT DETECTED NOT DETECTED Final   Chlamydophila pneumoniae NOT DETECTED NOT DETECTED Final   Mycoplasma pneumoniae NOT DETECTED NOT DETECTED Final    Comment: Performed at [facility], [address], [place], [place] [address]  Blood Culture (routine x 2)     Status: Abnormal   Collection Time: 10/01/23  9:33 AM   Specimen: BLOOD  Result Value Ref Range Status   Specimen Description   Final    BLOOD RIGHT WRIST Performed at [facility], [address] [place], [place] [address]    Special Requests   Final    BOTTLES DRAWN AEROBIC AND ANAEROBIC Blood Culture adequate volume Performed at [facility], [address] [place], [place] [address]    Culture  Setup Time   Final    GRAM NEGATIVE RODS IN BOTH AEROBIC AND ANAEROBIC BOTTLES CRITICAL RESULT CALLED TO, READ BACK BY AND VERIFIED WITH:  NATHAN BELUE AT 0457 10/02/23 JG Performed at [facility], [address] [place], [place] [address]    Culture SERRATIA MARCESCENS (A)  Final   Report Status 10/04/2023 FINAL  Final    Organism ID, Bacteria SERRATIA MARCESCENS  Final      Susceptibility   Serratia marcescens - MIC*    CEFEPIME  <=0.12 SENSITIVE Sensitive     CEFTAZIDIME <=1 SENSITIVE Sensitive     CEFTRIAXONE  <=0.25 SENSITIVE Sensitive     CIPROFLOXACIN  <=0.25 SENSITIVE Sensitive     GENTAMICIN <=1 SENSITIVE Sensitive     TRIMETH /SULFA  <=20 SENSITIVE Sensitive     * SERRATIA MARCESCENS  Blood Culture ID Panel (Reflexed)     Status: Abnormal   Collection Time: 10/01/23  9:33 AM  Result Value Ref Range Status   Enterococcus faecalis NOT DETECTED NOT DETECTED Final   Enterococcus Faecium NOT DETECTED  NOT DETECTED Final   Listeria monocytogenes NOT DETECTED NOT DETECTED Final   Staphylococcus species NOT DETECTED NOT DETECTED Final   Staphylococcus aureus (BCID) NOT DETECTED NOT DETECTED Final   Staphylococcus epidermidis NOT DETECTED NOT DETECTED Final   Staphylococcus lugdunensis NOT DETECTED NOT DETECTED Final   Streptococcus species NOT DETECTED NOT DETECTED Final   Streptococcus agalactiae NOT DETECTED NOT DETECTED Final   Streptococcus pneumoniae NOT DETECTED NOT DETECTED Final   Streptococcus pyogenes NOT DETECTED NOT DETECTED Final   A.calcoaceticus-baumannii NOT DETECTED NOT DETECTED Final   Bacteroides fragilis NOT DETECTED NOT DETECTED Final   Enterobacterales DETECTED (A) NOT DETECTED Final    Comment: Enterobacterales represent a large order of gram negative bacteria, not a single organism. CRITICAL RESULT CALLED TO, READ BACK BY AND VERIFIED WITH:  NATHAN BELUE AT 0457 10/02/23 JG    Enterobacter cloacae complex NOT DETECTED NOT DETECTED Final   Escherichia coli NOT DETECTED NOT DETECTED Final   Klebsiella aerogenes NOT DETECTED NOT DETECTED Final   Klebsiella oxytoca NOT DETECTED NOT DETECTED Final   Klebsiella pneumoniae NOT DETECTED NOT DETECTED Final   Proteus species NOT DETECTED NOT DETECTED Final   Salmonella species NOT DETECTED NOT DETECTED Final   Serratia marcescens  DETECTED (A) NOT DETECTED Final    Comment: CRITICAL RESULT CALLED TO, READ BACK BY AND VERIFIED WITH:  NATHAN BELUE AT 0457 10/02/23 JG    Haemophilus influenzae NOT DETECTED NOT DETECTED Final   Neisseria meningitidis NOT DETECTED NOT DETECTED Final   Pseudomonas aeruginosa NOT DETECTED NOT DETECTED Final   Stenotrophomonas maltophilia NOT DETECTED NOT DETECTED Final   Candida albicans NOT DETECTED NOT DETECTED Final   Candida auris NOT DETECTED NOT DETECTED Final   Candida glabrata NOT DETECTED NOT DETECTED Final   Candida krusei NOT DETECTED NOT DETECTED Final   Candida parapsilosis NOT DETECTED NOT DETECTED Final   Candida tropicalis NOT DETECTED NOT DETECTED Final   Cryptococcus neoformans/gattii NOT DETECTED NOT DETECTED Final   CTX-M ESBL NOT DETECTED NOT DETECTED Final   Carbapenem resistance IMP NOT DETECTED NOT DETECTED Final   Carbapenem resistance KPC NOT DETECTED NOT DETECTED Final   Carbapenem resistance NDM NOT DETECTED NOT DETECTED Final   Carbapenem resist OXA 48 LIKE NOT DETECTED NOT DETECTED Final   Carbapenem resistance VIM NOT DETECTED NOT DETECTED Final    Comment: Performed at [facility] [address] [place], [place] [address]  MRSA Next Gen by PCR, Nasal     Status: None   Collection Time: 10/02/23  5:26 PM   Specimen: Nasal Mucosa; Nasal Swab  Result Value Ref Range Status   MRSA by PCR Next Gen NOT DETECTED NOT DETECTED Final    Comment: (NOTE) The GeneXpert MRSA Assay (FDA approved for NASAL specimens only), is one component of a comprehensive MRSA colonization surveillance program. It is not intended to diagnose MRSA infection nor to guide or monitor treatment for MRSA infections. Test performance is not FDA approved in patients less than 71 years old. Performed at [facility], [address] [place], [place] [address]   Cath Tip Culture     Status: Abnormal   Collection Time: 10/02/23  9:28 PM   Specimen: Catheter Tip;  Other  Result Value Ref Range Status   Specimen Description   Final    CATH TIP Performed at [facility], [address], [place], [place] [address]    Special Requests   Final    NONE Performed at  [facility], [address] [place], [place] [address]    Culture >=100,000 COLONIES/mL SERRATIA MARCESCENS (A)  Final   Report Status 10/08/2023 FINAL  Final   Organism ID, Bacteria SERRATIA MARCESCENS (A)  Final      Susceptibility   Serratia marcescens - MIC*    CEFEPIME  <=0.12 SENSITIVE Sensitive     CEFTAZIDIME <=1 SENSITIVE Sensitive     CEFTRIAXONE  <=0.25 SENSITIVE Sensitive     CIPROFLOXACIN  <=0.25 SENSITIVE Sensitive     GENTAMICIN <=1 SENSITIVE Sensitive     TRIMETH /SULFA  <=20 SENSITIVE Sensitive     * >=100,000 COLONIES/mL SERRATIA MARCESCENS  Culture, blood (Routine X 2) w Reflex to ID Panel     Status: None   Collection Time: 10/03/23  1:00 PM   Specimen: BLOOD  Result Value Ref Range Status   Specimen Description BLOOD BLOOD LEFT WRIST  Final   Special Requests   Final    BOTTLES DRAWN AEROBIC ONLY Blood Culture adequate volume   Culture   Final    NO GROWTH 5 DAYS Performed at [facility], [address], [place], [place] [address]    Report Status 10/08/2023 FINAL  Final  Culture, blood (Routine X 2) w Reflex to ID Panel     Status: None   Collection Time: 10/03/23  1:04 PM   Specimen: BLOOD  Result Value Ref Range Status   Specimen Description BLOOD BLOOD LEFT FOREARM  Final   Special Requests   Final    BOTTLES DRAWN AEROBIC ONLY Blood Culture adequate volume   Culture   Final    NO GROWTH 5 DAYS Performed at [facility], [address], [place], [place] [address]    Report Status 10/08/2023 FINAL  Final    Coagulation Studies: Recent Labs    10/06/23 10/24/2101  LABPROT 15.7*  INR 1.2     Urinalysis: No results for input(s): "COLORURINE", "LABSPEC", "PHURINE", "GLUCOSEU", "HGBUR", "BILIRUBINUR", "KETONESUR",  "PROTEINUR", "UROBILINOGEN", "NITRITE", "LEUKOCYTESUR" in the last 72 hours.  Invalid input(s): "APPERANCEUR"    Imaging: No results found.     Medications:    [START ON 10/10/2023] ceFEPime  (MAXIPIME ) IV      amiodarone   200 mg Oral BID   apixaban   5 mg Oral BID   atorvastatin   80 mg Oral Daily   Chlorhexidine  Gluconate Cloth  6 each Topical Q0600   insulin  aspart  0-15 Units Subcutaneous TID WC   insulin  aspart  0-5 Units Subcutaneous QHS   insulin  aspart  5 Units Subcutaneous TID WC   insulin  glargine-yfgn  5 Units Subcutaneous Q2200   acetaminophen , gabapentin , Gerhardt's butt cream, ondansetron  **OR** ondansetron  (ZOFRAN ) IV  Assessment/ Plan:  Amy Wu is a 73 y.o.  female with past medical history of hypertension, diabetes, PVD, dyslipidemia, and end-stage renal disease on dialysis. She presents to the ED via EMS for evaluation of altered mental status and fever and has been admitted under observation for Acute respiratory failure with hypoxia (HCC) [J96.01] Sepsis (HCC) [A41.9] Sepsis, due to unspecified organism, unspecified whether acute organ dysfunction present (HCC) [A41.9]  [facility]/MWF/Rt thigh Permcath  End stage renal disease on hemodialysis.  Dialysis received yesterday with UF achieved. Next treatment scheduled for Wednesday.   Sepsis, secondary to line infection. Admitted with fever 103, and elevated white count, 12.2. Blood culture positive for serratia marcescens. ID request removal of tunneled catheter, removed on 4/15 with tip sent for culture. Catheter positive for same bacteria.  Appreciate vascular replacing HD permcath. Outpatient clinic notified of antibiotics, Cefepime  2g/2g/2g until 4/30. Will continue while inpatient.   3. Acute respiratory failure requiring Bipap on admission d/t volume overload.  Appears comfortable. on 2L.  4. Anemia of chronic kidney disease Lab Results  Component Value Date   HGB 9.3 (L)  10/09/2023  Hemoglobin 9.3.  She will receive Mircera as outpatient. Will consider low dose EPO with inpatient dialysis.   5. Secondary Hyperparathyroidism: with outpatient labs: None available   Lab Results  Component Value Date   CALCIUM  9.1 10/09/2023   PHOS 3.8 10/08/2023    Continue periodically monitor bone metabolism parameters as outpatient.  Will continue to monitor bone minerals.    LOS: 7 Reannah Totten 4/22/202511:32 AM

## 2023-10-09 NOTE — Progress Notes (Signed)
 PT Cancellation Note  Patient Details Name: AVIS TIRONE MRN: [id number] DOB: [birth year]   Cancelled Treatment:    Reason Eval/Treat Not Completed: Patient at procedure or test/unavailable. Patient OTF, pending CT. Will await results. Will re-attempt at later date/time.    Darrin Koman M Fairly, PT, DPT 10/09/23 12:08 PM

## 2023-10-09 NOTE — TOC Progression Note (Signed)
 Transition of Care [facility]) - Progression Note    Patient Details  Name: Amy Wu MRN: [id number] Date of Birth: 11-01-1950  [facility] of [facility]) CM/SW Contact  Odilia Bennett, LCSW Phone Number: 10/09/2023, 10:59 AM  Clinical Narrative:   CSW updated Robin with [facility]. They are coordinating DME delivery with husband.  Expected Discharge Plan and Services                                               Social Determinants of Health (SDOH) Interventions SDOH Screenings   Food Insecurity: No Food Insecurity (10/01/2023)  Housing: Low Risk  (10/01/2023)  Transportation Needs: No Transportation Needs (10/01/2023)  Utilities: Not At Risk (10/01/2023)  Social Connections: Moderately Isolated (10/01/2023)  Tobacco Use: Low Risk  (10/01/2023)    Readmission Risk Interventions    05/26/2021    1:38 PM 05/24/2021    3:35 PM  Readmission Risk Prevention Plan  Transportation Screening Complete Complete  PCP or Specialist Appt within 3-5 Days Complete Complete  HRI or Home Care Consult  Complete  Social Work Consult for Recovery Care Planning/Counseling Complete   Palliative Care Screening Not Applicable Not Applicable  Medication Review Oceanographer) Complete Complete

## 2023-10-09 NOTE — Progress Notes (Signed)
 Date of Admission:  10/01/2023      ID: Amy Wu is a 73 y.o. female Principal Problem:   Sepsis (HCC) Active Problems:   Type 2 diabetes mellitus (HCC)   Hyperlipidemia, unspecified   ESRD (end stage renal disease) (HCC)   Non-ST elevation (NSTEMI) myocardial infarction (HCC)   ESRD on dialysis (HCC)   Acute respiratory failure with hypoxia (HCC)   Encephalopathy   Elevated troponin I level   Bacteremia   Typical atrial flutter (HCC)  Had signed off  last week, back as she is confused  Subjective: Pt is pleasantly confused today She is not aware why she is No headache No fever No pain  Medications:   amiodarone   200 mg Oral BID   apixaban   5 mg Oral BID   atorvastatin   80 mg Oral Daily   Chlorhexidine  Gluconate Cloth  6 each Topical Q0600   insulin  aspart  0-15 Units Subcutaneous TID WC   insulin  aspart  0-5 Units Subcutaneous QHS   insulin  aspart  5 Units Subcutaneous TID WC   insulin  glargine-yfgn  5 Units Subcutaneous Q2200   [START ON 10/10/2023] levofloxacin   250 mg Oral QHS    Objective: Vital signs in last 24 hours: Patient Vitals for the past 24 hrs:  BP Temp Pulse Resp SpO2 Weight  10/09/23 0243 128/78 (!) 97.5 F (36.4 C) 70 16 95 % --  10/09/23 0235 -- -- -- -- -- 97.8 kg  10/09/23 0234 (!) 138/54 98.3 F (36.8 C) 79 16 98 % --  10/08/23 2037 -- -- -- 19 -- --      PHYSICAL EXAM:  General: awake, oriented in person Follows commands   Lungs: b/l air entry Heart: bradycardia. Abdomen: Soft, non-tender,not distended. Bowel sounds normal. No masses Extremities: rt AKA, left BKA Left groin cath Skin: No rashes or lesions. Or bruising Neurologic: Grossly non-focal  Lab Results    Latest Ref Rng & Units 10/09/2023    6:20 AM 10/08/2023    8:27 AM 10/08/2023    5:51 AM  CBC  WBC 4.0 - 10.5 K/uL 11.1  11.8  12.4   Hemoglobin 12.0 - 15.0 g/dL 9.3  91.4  78.2   Hematocrit 36.0 - 46.0 % 28.0  30.1  30.6   Platelets 150 - 400 K/uL 157  165   164        Latest Ref Rng & Units 10/09/2023   10:57 AM 10/08/2023    8:27 AM 10/07/2023    1:53 AM  CMP  Glucose 70 - 99 mg/dL 956  213  086   BUN 8 - 23 mg/dL 37  51  40   Creatinine 0.44 - 1.00 mg/dL 5.78  4.69  6.29   Sodium 135 - 145 mmol/L 134  133  134   Potassium 3.5 - 5.1 mmol/L 3.2  3.2  3.5   Chloride 98 - 111 mmol/L 98  98  96   CO2 22 - 32 mmol/L 23  23  24    Calcium  8.9 - 10.3 mg/dL 9.1  8.6  8.8       Microbiology: 10/01/23 BC Serratia bacteremia 4/15 cath tip serratia 4/16 BC- NG  Studies/Results: CT HEAD WO CONTRAST ( ) Result Date: 10/09/2023 CLINICAL DATA:  73 year old female with altered mental status. EXAM: CT HEAD WITHOUT CONTRAST TECHNIQUE: Contiguous axial images were obtained from the base of the skull through the vertex without intravenous contrast. RADIATION DOSE REDUCTION: This exam was performed according to the  departmental dose-optimization program which includes automated exposure control, adjustment of the mA and/or kV according to patient size and/or use of iterative reconstruction technique. COMPARISON:  Brain MRI 05/24/2021.  Head CT 05/23/2021. FINDINGS: Brain: No midline shift, ventriculomegaly, mass effect, evidence of mass lesion, intracranial hemorrhage or evidence of cortically based acute infarction. Basal ganglia vascular calcifications. Small chronic bilateral cerebellar infarcts are stable from the 2022 MRI. Patchy additional bilateral cerebral white matter hypodensity. Vascular: Severe Calcified atherosclerosis at the skull base. No suspicious intracranial vascular hyperdensity. Skull: Intact.  No acute osseous abnormality identified. Sinuses/Orbits: Visualized paranasal sinuses and mastoids are stable and well aerated. Other: Calcified scalp vessel atherosclerosis. Postoperative changes to both globes. No acute orbit or scalp soft tissue finding. IMPRESSION: 1. No acute intracranial abnormality. 2. Small chronic cerebellar infarcts, chronic  cerebral white matter changes, and Severe calcified atherosclerosis. Electronically Signed   By: Marlise Simpers M.D.   On: 10/09/2023 16:45     Assessment/Plan: Encephalopathy- l resolved   Acute hypoxic resp failure -resolved   Serratia bacteremia  related to HD catheter -catheter was removed and tip is serratia 2 d echo valves look okay Repeat blood culture neg so far  Is On cefepime  -  Plan was to treat for 2 weeks from the neg blood culture 10/17/23 But she has encephalopathy today ( no co2 narcosis, CT no acute changes) Pt on cefepime  which can cause encephalopathy So will Dc it Other options are PO levaquin  ( which can also csue encephalopathy- need to watch closely) or Iv ceftriaxone  which will have to be given everyday and she will need a central line for it) She needs another 9 days of antibiotic    ESRD on HD PAD DM Rt AKA and Left BKA   Anemia Thrombocytopenia improved   Discussed the management with the care team and pharmacist

## 2023-10-09 NOTE — Plan of Care (Signed)

## 2023-10-10 DIAGNOSIS — R7881 Bacteremia: Secondary | ICD-10-CM | POA: Diagnosis not present

## 2023-10-10 DIAGNOSIS — A488 Other specified bacterial diseases: Secondary | ICD-10-CM

## 2023-10-10 DIAGNOSIS — T827XXA Infection and inflammatory reaction due to other cardiac and vascular devices, implants and grafts, initial encounter: Secondary | ICD-10-CM | POA: Diagnosis not present

## 2023-10-10 DIAGNOSIS — Z992 Dependence on renal dialysis: Secondary | ICD-10-CM | POA: Diagnosis not present

## 2023-10-10 DIAGNOSIS — B9689 Other specified bacterial agents as the cause of diseases classified elsewhere: Secondary | ICD-10-CM | POA: Diagnosis not present

## 2023-10-10 DIAGNOSIS — A419 Sepsis, unspecified organism: Secondary | ICD-10-CM | POA: Diagnosis not present

## 2023-10-10 DIAGNOSIS — N186 End stage renal disease: Secondary | ICD-10-CM | POA: Diagnosis not present

## 2023-10-10 LAB — RENAL FUNCTION PANEL
Albumin: 2.7 g/dL — ABNORMAL LOW (ref 3.5–5.0)
Anion gap: 13 (ref 5–15)
BUN: 41 mg/dL — ABNORMAL HIGH (ref 8–23)
CO2: 21 mmol/L — ABNORMAL LOW (ref 22–32)
Calcium: 8.8 mg/dL — ABNORMAL LOW (ref 8.9–10.3)
Chloride: 97 mmol/L — ABNORMAL LOW (ref 98–111)
Creatinine, Ser: 7.06 mg/dL — ABNORMAL HIGH (ref 0.44–1.00)
GFR, Estimated: 6 mL/min — ABNORMAL LOW (ref >60)
Glucose, Bld: 148 mg/dL — ABNORMAL HIGH (ref 70–99)
Phosphorus: 3.9 mg/dL (ref 2.5–4.6)
Potassium: 3.2 mmol/L — ABNORMAL LOW (ref 3.5–5.1)
Sodium: 131 mmol/L — ABNORMAL LOW (ref 135–145)

## 2023-10-10 LAB — CBC
HCT: 26.4 % — ABNORMAL LOW (ref 36.0–46.0)
Hemoglobin: 8.9 g/dL — ABNORMAL LOW (ref 12.0–15.0)
MCH: 25.1 pg — ABNORMAL LOW (ref 26.0–34.0)
MCHC: 33.7 g/dL (ref 30.0–36.0)
MCV: 74.4 fL — ABNORMAL LOW (ref 80.0–100.0)
Platelets: 192 10*3/uL (ref 150–400)
RBC: 3.55 MIL/uL — ABNORMAL LOW (ref 3.87–5.11)
RDW: 17.2 % — ABNORMAL HIGH (ref 11.5–15.5)
WBC: 13.5 10*3/uL — ABNORMAL HIGH (ref 4.0–10.5)
nRBC: 0.1 % (ref 0.0–0.2)

## 2023-10-10 LAB — TSH: TSH: 3.956 u[IU]/mL (ref 0.350–4.500)

## 2023-10-10 LAB — GLUCOSE, CAPILLARY
Glucose-Capillary: 118 mg/dL — ABNORMAL HIGH (ref 70–99)
Glucose-Capillary: 125 mg/dL — ABNORMAL HIGH (ref 70–99)
Glucose-Capillary: 92 mg/dL (ref 70–99)

## 2023-10-10 MED ORDER — ALTEPLASE 2 MG IJ SOLR
INTRAMUSCULAR | Status: AC
Start: 2023-10-10 — End: ?
  Filled 2023-10-10: qty 4

## 2023-10-10 MED ORDER — EPOETIN ALFA-EPBX 4000 UNIT/ML IJ SOLN
INTRAMUSCULAR | Status: AC
  Filled 2023-10-10: qty 1

## 2023-10-10 MED ORDER — ALTEPLASE 2 MG IJ SOLR
2.0000 mg | Freq: Once | INTRAMUSCULAR | Status: AC | PRN
Start: 2023-10-10 — End: 2023-10-10
  Administered 2023-10-10: 2 mg

## 2023-10-10 MED ORDER — ALTEPLASE 2 MG IJ SOLR
2.0000 mg | Freq: Once | INTRAMUSCULAR | Status: AC | PRN
  Administered 2023-10-10: 2 mg

## 2023-10-10 MED ORDER — HEPARIN SODIUM (PORCINE) 1000 UNIT/ML DIALYSIS: 1000.0000 [IU] | INTRAMUSCULAR | Status: DC | PRN

## 2023-10-10 MED ORDER — EPOETIN ALFA-EPBX 4000 UNIT/ML IJ SOLN
4000.0000 [IU] | INTRAMUSCULAR | Status: DC
  Administered 2023-10-10 – 2023-10-12 (×2): 4000 [IU] via INTRAVENOUS
  Filled 2023-10-10 (×2): qty 1

## 2023-10-10 MED ORDER — SODIUM CHLORIDE 0.9 % IV SOLN
2.0000 g | INTRAVENOUS | Status: AC
  Administered 2023-10-10 – 2023-10-11 (×2): 2 g via INTRAVENOUS
  Filled 2023-10-10 (×2): qty 20

## 2023-10-10 NOTE — Plan of Care (Addendum)
 Pt gone most of the shift in HD. After return to floor, pt appeared confused and required frequent redirection and prompting to answer questions. Pt also appeared drowsy. VS obtained, MD Alva Jewels notified. This RN to continue to monitor.  Problem: Activity: Goal: Risk for activity intolerance will decrease Outcome: Progressing   Problem: Nutrition: Goal: Adequate nutrition will be maintained Outcome: Progressing   Problem: Coping: Goal: Level of anxiety will decrease Outcome: Progressing   Problem: Elimination: Goal: Will not experience complications related to bowel motility Outcome: Progressing

## 2023-10-10 NOTE — Progress Notes (Signed)
 Progress Note    10/10/2023 9:14 AM 5 Days Post-Op  Subjective:  Amy Wu is a 73 yo female now POD #5 from Dialysis Perma catheter placement to her left femoral vein.  No complications to note.  Patient is resting comfortably.  She endorses some soreness around the insertion site but otherwise very tolerable.  Patient and nursing staff endorse catheter is not working well for hemodialysis at this time.  No complaints overnight.  Vitals are remained stable.    Review of Systems  Constitutional:  Constitutional negative. Respiratory: Respiratory negative.  Cardiovascular: Cardiovascular negative.  GI: Gastrointestinal negative.  GU: Genitourinary negative. Musculoskeletal: Musculoskeletal negative.  Neurological: Neurological negative. Psychiatric: Psychiatric negative.  All other systems reviewed and are negative   Vitals:   10/10/23 0830 10/10/23 0845  BP: (!) 172/75 (!) 120/56  Pulse: 74 78  Resp: 14 17  Temp:    SpO2: 96% 94%   Physical Exam: Cardiac:  RRR, normal S1 and S2.  No rubs clicks gallops or murmurs. Lungs: Nonlabored breathing, clear throughout but diminished in the bases on auscultation.  Without rales rhonchi or wheezing. Incisions: Left femoral vein insertion site.  Dressings clean dry and intact no hematoma seroma to note. Extremities: Bilateral AKA's.  All extremities warm to touch. Abdomen: Bowel sounds throughout, soft, nontender nondistended.  Overly obese. Neurologic: Oriented x 3, neurologically intact.  CBC    Component Value Date/Time   WBC 13.5 (H) 10/10/2023 0759   RBC 3.55 (L) 10/10/2023 0759   HGB 8.9 (L) 10/10/2023 0759   HGB 12.4 12/03/2012 1414   HCT 26.4 (L) 10/10/2023 0759   HCT 38.0 12/03/2012 1414   PLT 192 10/10/2023 0759   PLT 193 12/03/2012 1414   MCV 74.4 (L) 10/10/2023 0759   MCV 81 12/03/2012 1414   MCH 25.1 (L) 10/10/2023 0759   MCHC 33.7 10/10/2023 0759   RDW 17.2 (H) 10/10/2023 0759   RDW 17.7 (H) 12/03/2012 1414    LYMPHSABS 0.7 10/01/2023 0912   MONOABS 0.8 10/01/2023 0912   EOSABS 0.0 10/01/2023 0912   BASOSABS 0.0 10/01/2023 0912    BMET    Component Value Date/Time   NA 131 (L) 10/10/2023 0759   NA 138 12/03/2012 1414   K 3.2 (L) 10/10/2023 0759   K 4.5 12/03/2012 1414   CL 97 (L) 10/10/2023 0759   CL 101 12/03/2012 1414   CO2 21 (L) 10/10/2023 0759   CO2 27 12/03/2012 1414   GLUCOSE 148 (H) 10/10/2023 0759   GLUCOSE 326 (H) 12/03/2012 1414   BUN 41 (H) 10/10/2023 0759   BUN 37 (H) 12/03/2012 1414   CREATININE 7.06 (H) 10/10/2023 0759   CREATININE 7.04 (H) 12/03/2012 1414   CALCIUM  8.8 (L) 10/10/2023 0759   CALCIUM  8.4 (L) 12/03/2012 1414   GFRNONAA 6 (L) 10/10/2023 0759   GFRNONAA 6 (L) 12/03/2012 1414   GFRAA 9 (L) 11/19/2019 1002   GFRAA 7 (L) 12/03/2012 1414    INR    Component Value Date/Time   INR 1.2 10/06/2023 2103   INR 1.0 07/26/2011 1643     Intake/Output Summary (Last 24 hours) at 10/10/2023 0914 Last data filed at 10/10/2023 0653 Gross per 24 hour  Intake 460 ml  Output --  Net 460 ml     Assessment/Plan:  73 y.o. female is s/p placement dialysis permacatheter to left femoral vein.   5 Days Post-Op   Plan Per vascular surgery okay to use catheter for hemodialysis. Continue to monitor for  dialysis difficulties.  Pain medication as needed Okay to discharge with dialysis primary catheter in place working well as soon as patient's medically stable.   DVT prophylaxis: Heparin  with dialysis   [name] 10/10/2023 9:14 AM

## 2023-10-10 NOTE — Progress Notes (Addendum)
 Approximately 0730--Pt left floor for HD prior to AM meds or VS being obtained. Meds, full assessment, and VS to be completed by this RN upon pt return to floor.  Approximately 1430--Pt returned to floor from HD. VS obtained and full assessment completed by this RN.

## 2023-10-10 NOTE — Progress Notes (Signed)
 Date of Admission:  10/01/2023      ID: Amy Wu is a 73 y.o. female Principal Problem:   Sepsis (HCC) Active Problems:   Type 2 diabetes mellitus (HCC)   Hyperlipidemia, unspecified   ESRD (end stage renal disease) (HCC)   Non-ST elevation (NSTEMI) myocardial infarction (HCC)   ESRD on dialysis (HCC)   Acute respiratory failure with hypoxia (HCC)   Encephalopathy   Elevated troponin I level   Bacteremia   Typical atrial flutter (HCC)   Serratia marcescens infection   Bacteremia associated with intravascular line (HCC)  Had signed off  last week, back as she is confused  Subjective: Pt is pleasantly confused today She is not aware why she is No headache No fever No pain  Medications:   amiodarone   200 mg Oral BID   apixaban   5 mg Oral BID   atorvastatin   80 mg Oral Daily   Chlorhexidine  Gluconate Cloth  6 each Topical Q0600   epoetin  alfa-epbx (RETACRIT ) injection  4,000 Units Intravenous Q M,W,F-1800   insulin  aspart  0-15 Units Subcutaneous TID WC   insulin  aspart  3 Units Subcutaneous TID WC   levofloxacin   250 mg Oral QHS    Objective: Vital signs in last 24 hours: Patient Vitals for the past 24 hrs:  BP Temp Temp src Pulse Resp SpO2 Weight  10/10/23 1425 (!) 143/64 97.6 F (36.4 C) -- 76 18 100 % --  10/10/23 1420 -- -- -- 78 15 100 % --  10/10/23 1400 (!) 131/109 -- -- 78 14 100 % --  10/10/23 1330 (!) 148/67 -- -- 81 20 100 % --  10/10/23 1300 134/78 -- -- (!) 47 17 100 % --  10/10/23 1230 (!) 175/80 -- -- 78 12 100 % --  10/10/23 1200 (!) 153/124 -- -- 83 19 100 % --  10/10/23 1130 (!) 153/68 -- -- 81 20 100 % --  10/10/23 1115 (!) 188/99 -- -- 79 10 100 % --  10/10/23 1100 (!) 182/114 -- -- 79 16 95 % --  10/10/23 1045 (!) 176/70 -- -- 78 19 100 % --  10/10/23 1030 (!) 156/50 -- -- 77 (!) 8 100 % --  10/10/23 1000 (!) 180/77 -- -- 78 19 97 % --  10/10/23 0930 (!) 173/75 -- -- 69 20 100 % --  10/10/23 0915 (!) 166/71 -- -- 86 14 93 % --   10/10/23 0900 (!) 144/68 -- -- (!) 25 19 97 % --  10/10/23 0845 (!) 120/56 -- -- 78 17 94 % --  10/10/23 0830 (!) 172/75 -- -- 74 14 96 % --  10/10/23 0821 (!) 174/75 98.4 F (36.9 C) Oral 78 17 94 % 95.2 kg  10/10/23 0534 (!) 181/61 97.8 F (36.6 C) Oral 75 20 98 % --  10/09/23 2140 (!) 187/86 98.8 F (37.1 C) Oral 86 20 96 % --  10/09/23 2127 -- -- -- -- 20 -- --      PHYSICAL EXAM:  General: awake, oriented in person Follows commands   Lungs: b/l air entry Heart: bradycardia. Abdomen: Soft, non-tender,not distended. Bowel sounds normal. No masses Extremities: rt AKA, left BKA Left groin cath Skin: No rashes or lesions. Or bruising Neurologic: Grossly non-focal  Lab Results    Latest Ref Rng & Units 10/10/2023    7:59 AM 10/09/2023    6:20 AM 10/08/2023    8:27 AM  CBC  WBC 4.0 - 10.5 K/uL 13.5  11.1  11.8   Hemoglobin 12.0 - 15.0 g/dL 8.9  9.3  16.1   Hematocrit 36.0 - 46.0 % 26.4  28.0  30.1   Platelets 150 - 400 K/uL 192  157  165        Latest Ref Rng & Units 10/10/2023    7:59 AM 10/09/2023   10:57 AM 10/08/2023    8:27 AM  CMP  Glucose 70 - 99 mg/dL 096  045  409   BUN 8 - 23 mg/dL 41  37  51   Creatinine 0.44 - 1.00 mg/dL 8.11  9.14  7.82   Sodium 135 - 145 mmol/L 131  134  133   Potassium 3.5 - 5.1 mmol/L 3.2  3.2  3.2   Chloride 98 - 111 mmol/L 97  98  98   CO2 22 - 32 mmol/L 21  23  23    Calcium  8.9 - 10.3 mg/dL 8.8  9.1  8.6       Microbiology: 10/01/23 BC Serratia bacteremia 4/15 cath tip serratia 4/16 BC- NG  Studies/Results: CT HEAD WO CONTRAST ( ) Result Date: 10/09/2023 CLINICAL DATA:  73 year old female with altered mental status. EXAM: CT HEAD WITHOUT CONTRAST TECHNIQUE: Contiguous axial images were obtained from the base of the skull through the vertex without intravenous contrast. RADIATION DOSE REDUCTION: This exam was performed according to the departmental dose-optimization program which includes automated exposure control, adjustment  of the mA and/or kV according to patient size and/or use of iterative reconstruction technique. COMPARISON:  Brain MRI 05/24/2021.  Head CT 05/23/2021. FINDINGS: Brain: No midline shift, ventriculomegaly, mass effect, evidence of mass lesion, intracranial hemorrhage or evidence of cortically based acute infarction. Basal ganglia vascular calcifications. Small chronic bilateral cerebellar infarcts are stable from the 2022 MRI. Patchy additional bilateral cerebral white matter hypodensity. Vascular: Severe Calcified atherosclerosis at the skull base. No suspicious intracranial vascular hyperdensity. Skull: Intact.  No acute osseous abnormality identified. Sinuses/Orbits: Visualized paranasal sinuses and mastoids are stable and well aerated. Other: Calcified scalp vessel atherosclerosis. Postoperative changes to both globes. No acute orbit or scalp soft tissue finding. IMPRESSION: 1. No acute intracranial abnormality. 2. Small chronic cerebellar infarcts, chronic cerebral white matter changes, and Severe calcified atherosclerosis. Electronically Signed   By: Marlise Simpers M.D.   On: 10/09/2023 16:45     Assessment/Plan:   Acute hypoxic resp failure -resolved   Serratia bacteremia  related to HD catheter -catheter was removed and tip is serratia 2 d echo valves look okay Repeat blood culture neg so far  Is On cefepime  -  Plan was to treat for 2 weeks from the neg blood culture 10/17/23 New encephalopathy since 10/09/23 Continues to have encephalopathy inspite of stopping cefepime - would avoid levaquin - will do ceftriaxone  for serratia bacteremia     ESRD on HD PAD DM Rt AKA and Left BKA   Anemia Thrombocytopenia improved   Discussed the management with the care team

## 2023-10-10 NOTE — Progress Notes (Signed)
   10/10/23 1425  Vitals  Temp 97.6 F (36.4 C)  Pulse Rate 76  Resp 18  BP (!) 143/64  SpO2 100 %  O2 Device Room Air  Post Treatment  Dialyzer Clearance Heavily streaked  Hemodialysis Intake (mL) 0 mL  Liters Processed 40.4  Fluid Removed (mL) 1600 mL  Tolerated HD Treatment Yes  Post-Hemodialysis Comments TERMINATED EARLY DUE TO CLOTTING   Received patient in bed to unit.  Alert and oriented.  Informed consent signed and in chart.   TX duration:3HRS 17 MINS  Patient tolerated well.  Transported back to the room  Alert, without acute distress.  Hand-off given to patient's nurse.   Access used: [email or web address] Access issues: TPA  Total UF removed: 1.6L Medication(s) given: EPO    [facility]

## 2023-10-10 NOTE — Progress Notes (Signed)
 [facility]  ROUNDING NOTE   Subjective:   Amy Wu  is a 73 y.o.  female with past medical history of hypertension, diabetes, PVD, dyslipidemia, and end-stage renal disease on dialysis. She presents to the ED via EMS for evaluation of altered mental status and fever and has been admitted under observation for Acute respiratory failure with hypoxia (HCC) [J96.01] Sepsis (HCC) [A41.9] Sepsis, due to unspecified organism, unspecified whether acute organ dysfunction present [facility]) [A41.9]  Patient is known to our practice and receives outpatient dialysis treatments at [facility] on a MWF schedule, supervised by [facility] physicians.   Update:  Patient seen and evaluated during dialysis   HEMODIALYSIS FLOWSHEET:  Blood Flow Rate (mL/min): 300 mL/min Arterial Pressure (mmHg): -48.68 mmHg Venous Pressure (mmHg): 1.21 mmHg TMP (mmHg): 26.46 mmHg Ultrafiltration Rate (mL/min): 857 mL/min Dialysate Flow Rate (mL/min): 0 ml/min Dialysis Fluid Bolus: Normal Saline Bolus Amount (mL): 300 mL  Hopeful for discharge after dialysis Permcath not functioning well with HD, HD RN to instill activase  for 1 hr and reattempt.    Objective:  Vital signs in last 24 hours:  Temp:  [97.8 F (36.6 C)-98.8 F (37.1 C)] 98.4 F (36.9 C) (04/23 0821) Pulse Rate:  [25-86] 69 (04/23 0930) Resp:  [14-20] 20 (04/23 0930) BP: (120-187)/(56-86) 173/75 (04/23 0930) SpO2:  [93 %-100 %] 100 % (04/23 0930) Weight:  [95.2 kg] 95.2 kg (04/23 0821)  Weight change:  Filed Weights   10/08/23 1330 10/09/23 0235 10/10/23 0821  Weight: 96.3 kg 97.8 kg 95.2 kg    Intake/Output: I/O last 3 completed shifts: In: 580 [P.O.:580] Out: -    Intake/Output this shift:  No intake/output data recorded.  Physical Exam: General: NAD  Head: Normocephalic, atraumatic.   Eyes: Anicteric  Lungs:  Diminished, [place] O2  Heart: Regular rate and rhythm  Abdomen:  Soft, nontender  Extremities: Trace Rt  stump peripheral edema. Bilat AKA  Neurologic: Alert, moving all four extremities  Skin: No lesions  Access: Lt thigh permcath placed 4/18    Basic Metabolic Panel: Recent Labs  Lab 10/04/23 0441 10/05/23 0556 10/06/23 0511 10/07/23 0153 10/08/23 0827 10/09/23 1057 10/10/23 0759  NA 133*   < > 132* 134* 133* 134* 131*  K 3.3*   < > 4.1 3.5 3.2* 3.2* 3.2*  CL 98   < > 97* 96* 98 98 97*  CO2 23   < > 21* 24 23 23  21*  GLUCOSE 155*   < > 414* 288* 155* 141* 148*  BUN 40*   < > 60* 40* 51* 37* 41*  CREATININE 7.42*   < > 9.06* 6.13* 7.32* 5.83* 7.06*  CALCIUM  8.2*   < > 8.7* 8.8* 8.6* 9.1 8.8*  MG 2.1  --   --   --   --   --   --   PHOS 4.3  --   --   --  3.8  --  3.9   < > = values in this interval not displayed.    Liver Function Tests: Recent Labs  Lab 10/08/23 0827 10/10/23 0759  ALBUMIN 2.5* 2.7*   No results for input(s): "LIPASE", "AMYLASE" in the last 168 hours. No results for input(s): "AMMONIA" in the last 168 hours.   CBC: Recent Labs  Lab 10/07/23 0153 10/08/23 0551 10/08/23 0827 10/09/23 0620 10/10/23 0759  WBC 11.1* 12.4* 11.8* 11.1* 13.5*  HGB 10.9* 10.3* 10.1* 9.3* 8.9*  HCT 32.5* 30.6* 30.1* 28.0* 26.4*  MCV  77.4* 76.7* 77.0* 77.8* 74.4*  PLT 159 164 165 157 192    Cardiac Enzymes: No results for input(s): "CKTOTAL", "CKMB", "CKMBINDEX", "TROPONINI" in the last 168 hours.  BNP: Invalid input(s): "POCBNP"  CBG: Recent Labs  Lab 10/08/23 1954 10/09/23 0920 10/09/23 1229 10/09/23 1715 10/09/23 2207  GLUCAP 168* 174* 137* 103* 131*    Microbiology: Results for orders placed or performed during the hospital encounter of 10/01/23  Resp panel by RT-PCR (RSV, Flu A&B, Covid) Anterior Nasal Swab     Status: None   Collection Time: 10/01/23  9:12 AM   Specimen: Anterior Nasal Swab  Result Value Ref Range Status   SARS Coronavirus 2 by RT PCR NEGATIVE NEGATIVE Final    Comment: (NOTE) SARS-CoV-2 target nucleic acids are NOT  DETECTED.  The SARS-CoV-2 RNA is generally detectable in upper respiratory specimens during the acute phase of infection. The lowest concentration of SARS-CoV-2 viral copies this assay can detect is 138 copies/mL. A negative result does not preclude SARS-Cov-2 infection and should not be used as the sole basis for treatment or other patient management decisions. A negative result may occur with  improper specimen collection/handling, submission of specimen other than nasopharyngeal swab, presence of viral mutation(s) within the areas targeted by this assay, and inadequate number of viral copies(<138 copies/mL). A negative result must be combined with clinical observations, patient history, and epidemiological information. The expected result is Negative.  Fact Sheet for Patients:  BloggerCourse.com  Fact Sheet for Healthcare Providers:  SeriousBroker.it  This test is no t yet approved or cleared by the [place]  FDA and  has been authorized for detection and/or diagnosis of SARS-CoV-2 by FDA under an Emergency Use Authorization (EUA). This EUA will remain  in effect (meaning this test can be used) for the duration of the COVID-19 declaration under Section 564(b)(1) of the Act, 21 U.S.C.section 360bbb-3(b)(1), unless the authorization is terminated  or revoked sooner.       Influenza A by PCR NEGATIVE NEGATIVE Final   Influenza B by PCR NEGATIVE NEGATIVE Final    Comment: (NOTE) The Xpert Xpress SARS-CoV-2/FLU/RSV plus assay is intended as an aid in the diagnosis of influenza from Nasopharyngeal swab specimens and should not be used as a sole basis for treatment. Nasal washings and aspirates are unacceptable for Xpert Xpress SARS-CoV-2/FLU/RSV testing.  Fact Sheet for Patients: BloggerCourse.com  Fact Sheet for Healthcare Providers: SeriousBroker.it  This test is not yet  approved or cleared by the [place]  FDA and has been authorized for detection and/or diagnosis of SARS-CoV-2 by FDA under an Emergency Use Authorization (EUA). This EUA will remain in effect (meaning this test can be used) for the duration of the COVID-19 declaration under Section 564(b)(1) of the Act, 21 U.S.C. section 360bbb-3(b)(1), unless the authorization is terminated or revoked.     Resp Syncytial Virus by PCR NEGATIVE NEGATIVE Final    Comment: (NOTE) Fact Sheet for Patients: BloggerCourse.com  Fact Sheet for Healthcare Providers: SeriousBroker.it  This test is not yet approved or cleared by the [place]  FDA and has been authorized for detection and/or diagnosis of SARS-CoV-2 by FDA under an Emergency Use Authorization (EUA). This EUA will remain in effect (meaning this test can be used) for the duration of the COVID-19 declaration under Section 564(b)(1) of the Act, 21 U.S.C. section 360bbb-3(b)(1), unless the authorization is terminated or revoked.  Performed at [facility], [address], [place], [place] [address]   Blood Culture (routine x 2)  Status: Abnormal   Collection Time: 10/01/23  9:12 AM   Specimen: BLOOD  Result Value Ref Range Status   Specimen Description   Final    BLOOD BLOOD LEFT FOREARM Performed at [facility], [address] [place], [place] [address]    Special Requests   Final    BOTTLES DRAWN AEROBIC AND ANAEROBIC Blood Culture results may not be optimal due to an inadequate volume of blood received in culture bottles Performed at [facility], [address] [place], [place] [address]    Culture  Setup Time   Final    GRAM NEGATIVE RODS IN BOTH AEROBIC AND ANAEROBIC BOTTLES CRITICAL VALUE NOTED.  VALUE IS CONSISTENT WITH PREVIOUSLY REPORTED AND CALLED VALUE. Performed at [facility], [address] [place], [place] [address]     Culture (A)  Final    SERRATIA MARCESCENS SUSCEPTIBILITIES PERFORMED ON PREVIOUS CULTURE WITHIN THE LAST 5 DAYS. Performed at [facility], [address], [place], [place] [address]    Report Status 10/04/2023 FINAL  Final  Respiratory (~20 pathogens) panel by PCR     Status: None   Collection Time: 10/01/23  9:12 AM   Specimen: Nasopharyngeal Swab; Respiratory  Result Value Ref Range Status   Adenovirus NOT DETECTED NOT DETECTED Final   Coronavirus 229E NOT DETECTED NOT DETECTED Final    Comment: (NOTE) The Coronavirus on the Respiratory Panel, DOES NOT test for the novel  Coronavirus (2019 nCoV)    Coronavirus HKU1 NOT DETECTED NOT DETECTED Final   Coronavirus NL63 NOT DETECTED NOT DETECTED Final   Coronavirus OC43 NOT DETECTED NOT DETECTED Final   Metapneumovirus NOT DETECTED NOT DETECTED Final   Rhinovirus / Enterovirus NOT DETECTED NOT DETECTED Final   Influenza A NOT DETECTED NOT DETECTED Final   Influenza B NOT DETECTED NOT DETECTED Final   Parainfluenza Virus 1 NOT DETECTED NOT DETECTED Final   Parainfluenza Virus 2 NOT DETECTED NOT DETECTED Final   Parainfluenza Virus 3 NOT DETECTED NOT DETECTED Final   Parainfluenza Virus 4 NOT DETECTED NOT DETECTED Final   Respiratory Syncytial Virus NOT DETECTED NOT DETECTED Final   Bordetella pertussis NOT DETECTED NOT DETECTED Final   Bordetella Parapertussis NOT DETECTED NOT DETECTED Final   Chlamydophila pneumoniae NOT DETECTED NOT DETECTED Final   Mycoplasma pneumoniae NOT DETECTED NOT DETECTED Final    Comment: Performed at [facility], [address] [place], [place] [address]  Blood Culture (routine x 2)     Status: Abnormal   Collection Time: 10/01/23  9:33 AM   Specimen: BLOOD  Result Value Ref Range Status   Specimen Description   Final    BLOOD RIGHT WRIST Performed at [facility], [address], [place], [place] [address]    Special Requests   Final    BOTTLES DRAWN AEROBIC AND ANAEROBIC  Blood Culture adequate volume Performed at [facility], [address], [place], [place] [address]    Culture  Setup Time   Final    GRAM NEGATIVE RODS IN BOTH AEROBIC AND ANAEROBIC BOTTLES CRITICAL RESULT CALLED TO, READ BACK BY AND VERIFIED WITH:  NATHAN BELUE AT 0457 10/02/23 JG Performed at [facility], [address] [place], [place] [address]    Culture SERRATIA MARCESCENS (A)  Final   Report Status 10/04/2023 FINAL  Final   Organism ID, Bacteria SERRATIA MARCESCENS  Final      Susceptibility   Serratia marcescens - MIC*  CEFEPIME  <=0.12 SENSITIVE Sensitive     CEFTAZIDIME <=1 SENSITIVE Sensitive     CEFTRIAXONE  <=0.25 SENSITIVE Sensitive     CIPROFLOXACIN  <=0.25 SENSITIVE Sensitive     GENTAMICIN <=1 SENSITIVE Sensitive     TRIMETH /SULFA  <=20 SENSITIVE Sensitive     * SERRATIA MARCESCENS  Blood Culture ID Panel (Reflexed)     Status: Abnormal   Collection Time: 10/01/23  9:33 AM  Result Value Ref Range Status   Enterococcus faecalis NOT DETECTED NOT DETECTED Final   Enterococcus Faecium NOT DETECTED NOT DETECTED Final   Listeria monocytogenes NOT DETECTED NOT DETECTED Final   Staphylococcus species NOT DETECTED NOT DETECTED Final   Staphylococcus aureus (BCID) NOT DETECTED NOT DETECTED Final   Staphylococcus epidermidis NOT DETECTED NOT DETECTED Final   Staphylococcus lugdunensis NOT DETECTED NOT DETECTED Final   Streptococcus species NOT DETECTED NOT DETECTED Final   Streptococcus agalactiae NOT DETECTED NOT DETECTED Final   Streptococcus pneumoniae NOT DETECTED NOT DETECTED Final   Streptococcus pyogenes NOT DETECTED NOT DETECTED Final   A.calcoaceticus-baumannii NOT DETECTED NOT DETECTED Final   Bacteroides fragilis NOT DETECTED NOT DETECTED Final   Enterobacterales DETECTED (A) NOT DETECTED Final    Comment: Enterobacterales represent a large order of gram negative bacteria, not a single organism. CRITICAL RESULT CALLED TO, READ BACK BY AND  VERIFIED WITH:  NATHAN BELUE AT 0457 10/02/23 JG    Enterobacter cloacae complex NOT DETECTED NOT DETECTED Final   Escherichia coli NOT DETECTED NOT DETECTED Final   Klebsiella aerogenes NOT DETECTED NOT DETECTED Final   Klebsiella oxytoca NOT DETECTED NOT DETECTED Final   Klebsiella pneumoniae NOT DETECTED NOT DETECTED Final   Proteus species NOT DETECTED NOT DETECTED Final   Salmonella species NOT DETECTED NOT DETECTED Final   Serratia marcescens DETECTED (A) NOT DETECTED Final    Comment: CRITICAL RESULT CALLED TO, READ BACK BY AND VERIFIED WITH:  NATHAN BELUE AT 0457 10/02/23 JG    Haemophilus influenzae NOT DETECTED NOT DETECTED Final   Neisseria meningitidis NOT DETECTED NOT DETECTED Final   Pseudomonas aeruginosa NOT DETECTED NOT DETECTED Final   Stenotrophomonas maltophilia NOT DETECTED NOT DETECTED Final   Candida albicans NOT DETECTED NOT DETECTED Final   Candida auris NOT DETECTED NOT DETECTED Final   Candida glabrata NOT DETECTED NOT DETECTED Final   Candida krusei NOT DETECTED NOT DETECTED Final   Candida parapsilosis NOT DETECTED NOT DETECTED Final   Candida tropicalis NOT DETECTED NOT DETECTED Final   Cryptococcus neoformans/gattii NOT DETECTED NOT DETECTED Final   CTX-M ESBL NOT DETECTED NOT DETECTED Final   Carbapenem resistance IMP NOT DETECTED NOT DETECTED Final   Carbapenem resistance KPC NOT DETECTED NOT DETECTED Final   Carbapenem resistance NDM NOT DETECTED NOT DETECTED Final   Carbapenem resist OXA 48 LIKE NOT DETECTED NOT DETECTED Final   Carbapenem resistance VIM NOT DETECTED NOT DETECTED Final    Comment: Performed at [facility], [address] [place], [place] [address]  MRSA Next Gen by PCR, Nasal     Status: None   Collection Time: 10/02/23  5:26 PM   Specimen: Nasal Mucosa; Nasal Swab  Result Value Ref Range Status   MRSA by PCR Next Gen NOT DETECTED NOT DETECTED Final    Comment: (NOTE) The GeneXpert MRSA Assay (FDA approved for NASAL  specimens only), is one component of a comprehensive MRSA colonization surveillance program. It is not intended to diagnose MRSA infection nor to guide or monitor treatment for MRSA infections. Test performance  is not FDA approved in patients less than 72 years old. Performed at [facility], [address] [place], [place] [address]   Cath Tip Culture     Status: Abnormal   Collection Time: 10/02/23  9:28 PM   Specimen: Catheter Tip; Other  Result Value Ref Range Status   Specimen Description   Final    CATH TIP Performed at [facility], [address] [place], [place] [address]    Special Requests   Final    NONE Performed at [facility], [address] [place], [place] [address]    Culture >=100,000 COLONIES/mL SERRATIA MARCESCENS (A)  Final   Report Status 10/08/2023 FINAL  Final   Organism ID, Bacteria SERRATIA MARCESCENS (A)  Final      Susceptibility   Serratia marcescens - MIC*    CEFEPIME  <=0.12 SENSITIVE Sensitive     CEFTAZIDIME <=1 SENSITIVE Sensitive     CEFTRIAXONE  <=0.25 SENSITIVE Sensitive     CIPROFLOXACIN  <=0.25 SENSITIVE Sensitive     GENTAMICIN <=1 SENSITIVE Sensitive     TRIMETH /SULFA  <=20 SENSITIVE Sensitive     * >=100,000 COLONIES/mL SERRATIA MARCESCENS  Culture, blood (Routine X 2) w Reflex to ID Panel     Status: None   Collection Time: 10/03/23  1:00 PM   Specimen: BLOOD  Result Value Ref Range Status   Specimen Description BLOOD BLOOD LEFT WRIST  Final   Special Requests   Final    BOTTLES DRAWN AEROBIC ONLY Blood Culture adequate volume   Culture   Final    NO GROWTH 5 DAYS Performed at [facility], [address], [place], [place] [address]    Report Status 10/08/2023 FINAL  Final  Culture, blood (Routine X 2) w Reflex to ID Panel     Status: None   Collection Time: 10/03/23  1:04 PM   Specimen: BLOOD  Result Value Ref Range Status   Specimen Description BLOOD BLOOD LEFT FOREARM  Final    Special Requests   Final    BOTTLES DRAWN AEROBIC ONLY Blood Culture adequate volume   Culture   Final    NO GROWTH 5 DAYS Performed at [facility], [address] [place], [place] [address]    Report Status 10/08/2023 FINAL  Final    Coagulation Studies: No results for input(s): "LABPROT", "INR" in the last 72 hours.    Urinalysis: No results for input(s): "COLORURINE", "LABSPEC", "PHURINE", "GLUCOSEU", "HGBUR", "BILIRUBINUR", "KETONESUR", "PROTEINUR", "UROBILINOGEN", "NITRITE", "LEUKOCYTESUR" in the last 72 hours.  Invalid input(s): "APPERANCEUR"    Imaging: CT HEAD WO CONTRAST ( ) Result Date: 10/09/2023 CLINICAL DATA:  73 year old female with altered mental status. EXAM: CT HEAD WITHOUT CONTRAST TECHNIQUE: Contiguous axial images were obtained from the base of the skull through the vertex without intravenous contrast. RADIATION DOSE REDUCTION: This exam was performed according to the departmental dose-optimization program which includes automated exposure control, adjustment of the mA and/or kV according to patient size and/or use of iterative reconstruction technique. COMPARISON:  Brain MRI 05/24/2021.  Head CT 05/23/2021. FINDINGS: Brain: No midline shift, ventriculomegaly, mass effect, evidence of mass lesion, intracranial hemorrhage or evidence of cortically based acute infarction. Basal ganglia vascular calcifications. Small chronic bilateral cerebellar infarcts are stable from the 2022 MRI. Patchy additional bilateral cerebral white matter hypodensity. Vascular: Severe Calcified atherosclerosis at the skull base. No suspicious intracranial vascular hyperdensity. Skull: Intact.  No acute osseous abnormality identified. Sinuses/Orbits: Visualized paranasal sinuses and mastoids are stable and well aerated.  Other: Calcified scalp vessel atherosclerosis. Postoperative changes to both globes. No acute orbit or scalp soft tissue finding. IMPRESSION: 1. No acute intracranial  abnormality. 2. Small chronic cerebellar infarcts, chronic cerebral white matter changes, and Severe calcified atherosclerosis. Electronically Signed   By: Marlise Simpers M.D.   On: 10/09/2023 16:45       Medications:      amiodarone   200 mg Oral BID   apixaban   5 mg Oral BID   atorvastatin   80 mg Oral Daily   Chlorhexidine  Gluconate Cloth  6 each Topical Q0600   insulin  aspart  0-15 Units Subcutaneous TID WC   insulin  aspart  0-5 Units Subcutaneous QHS   insulin  aspart  3 Units Subcutaneous TID WC   insulin  glargine-yfgn  5 Units Subcutaneous Q2200   levofloxacin   250 mg Oral QHS   acetaminophen , gabapentin , Gerhardt's butt cream, heparin , ondansetron  **OR** ondansetron  (ZOFRAN ) IV  Assessment/ Plan:  Amy Wu is a 73 y.o.  female with past medical history of hypertension, diabetes, PVD, dyslipidemia, and end-stage renal disease on dialysis. She presents to the ED via EMS for evaluation of altered mental status and fever and has been admitted under observation for Acute respiratory failure with hypoxia (HCC) [J96.01] Sepsis (HCC) [A41.9] Sepsis, due to unspecified organism, unspecified whether acute organ dysfunction present (HCC) [A41.9]  [facility] /MWF/Rt thigh Permcath  End stage renal disease on hemodialysis.  Receiving dialysis today, UF 2L as tolerated. Next treatment scheduled for Friday.   Sepsis, secondary to line infection. Admitted with fever 103, and elevated white count, 12.2. Blood culture positive for serratia marcescens. ID request removal of tunneled catheter, removed on 4/15 with tip sent for culture. Catheter positive for same bacteria.    Appreciate vascular replacing HD permcath. IV antibiotics transitioned to oral Levaquin . Will require close monitoring for encephalopathy.   3. Acute respiratory failure requiring Bipap on admission d/t volume overload.  Weaned to room air.  4. Anemia of chronic kidney disease Lab Results  Component Value  Date   HGB 8.9 (L) 10/10/2023  Hemoglobin 8.9.  She will receive Mircera as outpatient. Will order low dose EPO with treatment.   5. Secondary Hyperparathyroidism: with outpatient labs: None available   Lab Results  Component Value Date   CALCIUM  8.8 (L) 10/10/2023   PHOS 3.9 10/10/2023    Continue periodically monitor bone metabolism parameters as outpatient.  Will continue to monitor bone minerals.    LOS: 8 [place] Huegel 4/23/20259:54 AM

## 2023-10-10 NOTE — Progress Notes (Signed)
 OT Cancellation Note  Patient Details Name: Amy Wu MRN: [id number] DOB: 06-15-1951   Cancelled Treatment:    Reason Eval/Treat Not Completed: Patient at procedure or test/ unavailable. Pt currently off floor for HD. Will re-attempt OT tx when pt is available and medically appropriate.  Basil Boston 10/10/2023, 10:01 AM

## 2023-10-10 NOTE — Progress Notes (Signed)
 PROGRESS NOTE  Amy Wu    DOB: 09-03-1950, 73 y.o.  JXB:[id number]    Code Status: Limited: Do not attempt resuscitation (DNR) -DNR-LIMITED -Do Not Intubate/DNI    DOA: 10/01/2023   LOS: 8   Brief hospital course  Amy Wu is a 73 y.o. female with medical history significant of end-stage renal disease on hemodialysis MWF, type 2 diabetes, hyperlipidemia, peripheral vascular disease presenting to ED 10/01/23 from home with generalized malaise and fevers, confusion per family, mild cough/SOB. No abdominal pain but some diarrhea. Missed hemodialysis 04/13 secondary to symptoms.    04/14: admitted to hospitalist service for sepsis as well as acute resp fail w/ hypoxia and concern for volume overload. Requiring BiPAP  04/15: (+)bacteremia w/ serratia, abx to cefepime . Pt transferred to the stepdown unit for pressors. ID consult, HD catheter removed due to likely source of sepsis. Elevated troponins, started heparin   04/16: Off levo later today and onto scheduled midodrine . Echo ordered. Cardiology consulted, continue heparin  infusion, not on beta-blocker secondary to bradycardia, scheduled for cardiac cath on 04/17. Transthoracic echo ok. Repeating BCx today. Plan reinsertion HD cath 04/18.  04/17: LHC today. Significant CAD, no PCI 04/18: L femoral dialysis cath placed, planning dialysis tomorrow. Glc substantial increase, adjusting sliding scale  04/19: New onset atrial flutter, cardiology following, started heparin  and amiodarone . Dialysis today. Still quite hyperglycemic.  04/20: converted to sinus. Son unable to get meds from pharmacy, pt will need Eliquis  - will plan dialysis tomorrow, transition to eliquis  and confirm home meds access and DME, hopefully will be able to dc tomorrow  04/21: hospital bed will be delivered tomorrow and [facility] can transport in AM, anticipate home tomorrow.  04/22: significant confusion this morning, oriented but "not right." Workup negative, exam not  concerning for CVA, CT nonacute, changing cefepime  to levaquin  and hopefully this will help.   10/10/23 -remains hypertensive. Continues to be disoriented/lethargic intermittently   Assessment & Plan  Principal Problem:   Sepsis (HCC) Active Problems:   Acute respiratory failure with hypoxia (HCC)   ESRD on dialysis (HCC)   Encephalopathy   Type 2 diabetes mellitus (HCC)   Hyperlipidemia, unspecified   ESRD (end stage renal disease) (HCC)   Non-ST elevation (NSTEMI) myocardial infarction (HCC)   Elevated troponin I level   Bacteremia   Typical atrial flutter (HCC)   Serratia marcescens infection   Bacteremia associated with intravascular line (HCC)  Metabolic encephalopathy possible effect of cefepime > changed to levaquin  but still confused today.  Likely component of vascular dementia  Changed to levaquin  per ID recs Delirium precautions    Severe Sepsis w/ Serratia bacteremia likely source HD catheter which has been removed Complicated by septic shock, see below  s/p HD catheter placement 10/05/23 following neg repeat BCx Cefepime  changed to levaquin  per ID until 5/1   Septic shock requiring vasopressor support, shock resolved and now off IV pressors- resolved.  Midodrine  dc'd   New atrial flutter- New onset this morning 04/19 Rate controlled in the 70s Asymptomatic amiodarone  200 twice daily x 2 weeks then down to 200 daily   Eliquis     Elevated troponin secondary NSTEMI vs. demand ischemia  Coronary Artery Disease w/ extensive calcification / atherosclerotic disease  Hx: HTN, HLD and PVD Echo 10/02/23: EF >55%; mild tricuspid valve regurgitation; trivial pulmonic valve regurgitation  Cardiac catheterization was attempted 10/04/23 but was very challenging due to difficult vascular anatomy. Only the right coronary artery could be engaged and was found to have sequential  80 to 90% lesions.  No ASA, on Eliquis  atorvastatin   Beta blocker has caused  bradycardia/hypotension, defer this medication    Acute hypoxic respiratory failure - improved  Likely d/t pulmonary vascular congestion d/t missed dialysis Supplemental O2 for dyspnea and/or hypoxia  Maintain O2 sats 92% or higher  Fluid removal with HD     ESRD on HD (M-W-F) Hyponatremia s/p HD catheter placement 10/05/23 Trend BMP per nephrology / as needed  Replace electrolytes as indicated  Nephrology consulted appreciate input   Elevated AST  Trend hepatic function panel  Avoid hepatotoxic agents as able    Type II diabetes mellitus  PT had previously been d/c insulin  outpatient d/t severe hypoglycemia, was normoglycemic initially and A1C <7 however Glc past few days has been quite high, diet has been non-carb-controlled, question d/t steroid dose, has been coming down  CBG's ac/hs/prn diet to renal/carb   SSI decreased  Target CBG's 140 to 180   Body mass index is 46.66 kg/m.  VTE ppx:  apixaban  (ELIQUIS ) tablet 5 mg   Diet:     Diet   Diet renal/carb modified with fluid restriction Diet-HS Snack? Nothing; Fluid restriction: 1500 mL Fluid; Room service appropriate? Yes; Fluid consistency: Thin   Consultants: Nephrology Vascular surgery ID CCM Cardiology   Subjective 10/10/23    Pt reports no complaints. She is looking forward to going home. Denies pain, SOB. She is very lethargic.    Objective   Vitals:   10/09/23 1700 10/09/23 2127 10/09/23 2140 10/10/23 0534  BP: (!) 146/72  (!) 187/86 (!) 181/61  Pulse: 81  86 75  Resp: 16 20 20 20   Temp: 98.7 F (37.1 C)  98.8 F (37.1 C) 97.8 F (36.6 C)  TempSrc: Oral  Oral Oral  SpO2: 98%  96% 98%  Weight:      Height:        Intake/Output Summary (Last 24 hours) at 10/10/2023 0748 Last data filed at 10/10/2023 0653 Gross per 24 hour  Intake 580 ml  Output --  Net 580 ml   Filed Weights   10/08/23 0810 10/08/23 1330 10/09/23 0235  Weight: 96.6 kg 96.3 kg 97.8 kg     Physical Exam:  General:  awake, alert, NAD. lethargic HEENT: atraumatic, clear conjunctiva, anicteric sclera, MMM, hearing grossly normal Respiratory: normal respiratory effort. Cardiovascular: quick capillary refill, normal S1/S2, RRR, no JVD, murmurs Gastrointestinal: soft, NT, ND Nervous: A&O x3. no gross focal neurologic deficits, normal speech Extremities: moves all equally, no edema, normal tone Skin: dry, intact, normal temperature, normal color. No rashes, lesions or ulcers on exposed skin  Labs   I have personally reviewed the following labs and imaging studies CBC    Component Value Date/Time   WBC 11.1 (H) 10/09/2023 0620   RBC 3.60 (L) 10/09/2023 0620   HGB 9.3 (L) 10/09/2023 0620   HGB 12.4 12/03/2012 1414   HCT 28.0 (L) 10/09/2023 0620   HCT 38.0 12/03/2012 1414   PLT 157 10/09/2023 0620   PLT 193 12/03/2012 1414   MCV 77.8 (L) 10/09/2023 0620   MCV 81 12/03/2012 1414   MCH 25.8 (L) 10/09/2023 0620   MCHC 33.2 10/09/2023 0620   RDW 17.0 (H) 10/09/2023 0620   RDW 17.7 (H) 12/03/2012 1414   LYMPHSABS 0.7 10/01/2023 0912   MONOABS 0.8 10/01/2023 0912   EOSABS 0.0 10/01/2023 0912   BASOSABS 0.0 10/01/2023 0912      Latest Ref Rng & Units 10/09/2023   10:57 AM  10/08/2023    8:27 AM 10/07/2023    1:53 AM  BMP  Glucose 70 - 99 mg/dL 409  811  914   BUN 8 - 23 mg/dL 37  51  40   Creatinine 0.44 - 1.00 mg/dL 7.82  9.56  2.13   Sodium 135 - 145 mmol/L 134  133  134   Potassium 3.5 - 5.1 mmol/L 3.2  3.2  3.5   Chloride 98 - 111 mmol/L 98  98  96   CO2 22 - 32 mmol/L 23  23  24    Calcium  8.9 - 10.3 mg/dL 9.1  8.6  8.8     CT HEAD WO CONTRAST ( ) Result Date: 10/09/2023 CLINICAL DATA:  73 year old female with altered mental status. EXAM: CT HEAD WITHOUT CONTRAST TECHNIQUE: Contiguous axial images were obtained from the base of the skull through the vertex without intravenous contrast. RADIATION DOSE REDUCTION: This exam was performed according to the departmental dose-optimization program  which includes automated exposure control, adjustment of the mA and/or kV according to patient size and/or use of iterative reconstruction technique. COMPARISON:  Brain MRI 05/24/2021.  Head CT 05/23/2021. FINDINGS: Brain: No midline shift, ventriculomegaly, mass effect, evidence of mass lesion, intracranial hemorrhage or evidence of cortically based acute infarction. Basal ganglia vascular calcifications. Small chronic bilateral cerebellar infarcts are stable from the 2022 MRI. Patchy additional bilateral cerebral white matter hypodensity. Vascular: Severe Calcified atherosclerosis at the skull base. No suspicious intracranial vascular hyperdensity. Skull: Intact.  No acute osseous abnormality identified. Sinuses/Orbits: Visualized paranasal sinuses and mastoids are stable and well aerated. Other: Calcified scalp vessel atherosclerosis. Postoperative changes to both globes. No acute orbit or scalp soft tissue finding. IMPRESSION: 1. No acute intracranial abnormality. 2. Small chronic cerebellar infarcts, chronic cerebral white matter changes, and Severe calcified atherosclerosis. Electronically Signed   By: Marlise Simpers M.D.   On: 10/09/2023 16:45    Disposition Plan & Communication  Patient status: Inpatient  Admitted From: Home Planned disposition location: Home Anticipated discharge date: TBD pending clinical stability   Family Communication: none at bedside     Author: Ree Candy, DO [facility] 10/10/2023, 7:48 AM   Available by Epic secure chat 7AM-7PM. If 7PM-7AM, please contact night-coverage.  [facility] contact information found on [email or web address].

## 2023-10-10 NOTE — H&P (View-Only) (Signed)
 Progress Note    10/10/2023 9:14 AM 5 Days Post-Op  Subjective:  Amy Wu is a 73 yo female now POD #5 from Dialysis Perma catheter placement to her left femoral vein.  No complications to note.  Patient is resting comfortably.  She endorses some soreness around the insertion site but otherwise very tolerable.  Patient and nursing staff endorse catheter is not working well for hemodialysis at this time.  No complaints overnight.  Vitals are remained stable.    Review of Systems  Constitutional:  Constitutional negative. Respiratory: Respiratory negative.  Cardiovascular: Cardiovascular negative.  GI: Gastrointestinal negative.  GU: Genitourinary negative. Musculoskeletal: Musculoskeletal negative.  Neurological: Neurological negative. Psychiatric: Psychiatric negative.  All other systems reviewed and are negative   Vitals:   10/10/23 0830 10/10/23 0845  BP: (!) 172/75 (!) 120/56  Pulse: 74 78  Resp: 14 17  Temp:    SpO2: 96% 94%   Physical Exam: Cardiac:  RRR, normal S1 and S2.  No rubs clicks gallops or murmurs. Lungs: Nonlabored breathing, clear throughout but diminished in the bases on auscultation.  Without rales rhonchi or wheezing. Incisions: Left femoral vein insertion site.  Dressings clean dry and intact no hematoma seroma to note. Extremities: Bilateral AKA's.  All extremities warm to touch. Abdomen: Bowel sounds throughout, soft, nontender nondistended.  Overly obese. Neurologic: Oriented x 3, neurologically intact.  CBC    Component Value Date/Time   WBC 13.5 (H) 10/10/2023 0759   RBC 3.55 (L) 10/10/2023 0759   HGB 8.9 (L) 10/10/2023 0759   HGB 12.4 12/03/2012 1414   HCT 26.4 (L) 10/10/2023 0759   HCT 38.0 12/03/2012 1414   PLT 192 10/10/2023 0759   PLT 193 12/03/2012 1414   MCV 74.4 (L) 10/10/2023 0759   MCV 81 12/03/2012 1414   MCH 25.1 (L) 10/10/2023 0759   MCHC 33.7 10/10/2023 0759   RDW 17.2 (H) 10/10/2023 0759   RDW 17.7 (H) 12/03/2012 1414    LYMPHSABS 0.7 10/01/2023 0912   MONOABS 0.8 10/01/2023 0912   EOSABS 0.0 10/01/2023 0912   BASOSABS 0.0 10/01/2023 0912    BMET    Component Value Date/Time   NA 131 (L) 10/10/2023 0759   NA 138 12/03/2012 1414   K 3.2 (L) 10/10/2023 0759   K 4.5 12/03/2012 1414   CL 97 (L) 10/10/2023 0759   CL 101 12/03/2012 1414   CO2 21 (L) 10/10/2023 0759   CO2 27 12/03/2012 1414   GLUCOSE 148 (H) 10/10/2023 0759   GLUCOSE 326 (H) 12/03/2012 1414   BUN 41 (H) 10/10/2023 0759   BUN 37 (H) 12/03/2012 1414   CREATININE 7.06 (H) 10/10/2023 0759   CREATININE 7.04 (H) 12/03/2012 1414   CALCIUM  8.8 (L) 10/10/2023 0759   CALCIUM  8.4 (L) 12/03/2012 1414   GFRNONAA 6 (L) 10/10/2023 0759   GFRNONAA 6 (L) 12/03/2012 1414   GFRAA 9 (L) 11/19/2019 1002   GFRAA 7 (L) 12/03/2012 1414    INR    Component Value Date/Time   INR 1.2 10/06/2023 2103   INR 1.0 07/26/2011 1643     Intake/Output Summary (Last 24 hours) at 10/10/2023 0914 Last data filed at 10/10/2023 0653 Gross per 24 hour  Intake 460 ml  Output --  Net 460 ml     Assessment/Plan:  73 y.o. female is s/p placement dialysis permacatheter to left femoral vein.   5 Days Post-Op   Plan Per vascular surgery okay to use catheter for hemodialysis. Continue to monitor for  dialysis difficulties.  Pain medication as needed Okay to discharge with dialysis primary catheter in place working well as soon as patient's medically stable.   DVT prophylaxis: Heparin  with dialysis   [name] [facility] 10/10/2023 9:14 AM

## 2023-10-10 NOTE — TOC Progression Note (Signed)
 Transition of Care [facility]) - Progression Note    Patient Details  Name: Amy Wu MRN: [id number] Date of Birth: [birth year]  [facility] CM/SW Contact  Zoe Hinds, RN Phone Number: 10/10/2023, 2:33 PM  Clinical Narrative:    This CM updated by covering MD pt not discharging  today after being assessed being more lethargic today during HD session. This CM updated [facility] RN Corbin Dess @[phone] of this updated info. Robin @ [facility] informed pt's dc transportation can be arranged through [facility] if pt dc before 4pm when medically cleared.   Also provided pt's son Laurell Pond info and informed he wants to be contacted when pt is actively discharging @ [phone]. TOC will cont to follow pt during hospital stay and update as applicable.         Expected Discharge Plan and Services                                               Social Determinants of Health (SDOH) Interventions SDOH Screenings   Food Insecurity: No Food Insecurity (10/01/2023)  Housing: Low Risk  (10/01/2023)  Transportation Needs: No Transportation Needs (10/01/2023)  Utilities: Not At Risk (10/01/2023)  Social Connections: Moderately Isolated (10/01/2023)  Tobacco Use: Low Risk  (10/01/2023)    Readmission Risk Interventions    05/26/2021    1:38 PM 05/24/2021    3:35 PM  Readmission Risk Prevention Plan  Transportation Screening Complete Complete  PCP or Specialist Appt within 3-5 Days Complete Complete  HRI or Home Care Consult  Complete  Social Work Consult for Recovery Care Planning/Counseling Complete   Palliative Care Screening Not Applicable Not Applicable  Medication Review Oceanographer) Complete Complete

## 2023-10-11 ENCOUNTER — Inpatient Hospital Stay: Payer: Medicare (Managed Care)

## 2023-10-11 DIAGNOSIS — B9689 Other specified bacterial agents as the cause of diseases classified elsewhere: Secondary | ICD-10-CM | POA: Diagnosis not present

## 2023-10-11 DIAGNOSIS — A488 Other specified bacterial diseases: Secondary | ICD-10-CM | POA: Diagnosis not present

## 2023-10-11 DIAGNOSIS — N186 End stage renal disease: Secondary | ICD-10-CM | POA: Diagnosis not present

## 2023-10-11 DIAGNOSIS — R7881 Bacteremia: Secondary | ICD-10-CM | POA: Diagnosis not present

## 2023-10-11 DIAGNOSIS — T827XXA Infection and inflammatory reaction due to other cardiac and vascular devices, implants and grafts, initial encounter: Secondary | ICD-10-CM | POA: Diagnosis not present

## 2023-10-11 DIAGNOSIS — Z992 Dependence on renal dialysis: Secondary | ICD-10-CM | POA: Diagnosis not present

## 2023-10-11 LAB — GLUCOSE, CAPILLARY
Glucose-Capillary: 107 mg/dL — ABNORMAL HIGH (ref 70–99)
Glucose-Capillary: 146 mg/dL — ABNORMAL HIGH (ref 70–99)
Glucose-Capillary: 167 mg/dL — ABNORMAL HIGH (ref 70–99)
Glucose-Capillary: 193 mg/dL — ABNORMAL HIGH (ref 70–99)
Glucose-Capillary: 162 mg/dL — ABNORMAL HIGH (ref 70–99)

## 2023-10-11 LAB — RENAL FUNCTION PANEL
Albumin: 2.6 g/dL — ABNORMAL LOW (ref 3.5–5.0)
Anion gap: 12 (ref 5–15)
BUN: 26 mg/dL — ABNORMAL HIGH (ref 8–23)
CO2: 23 mmol/L (ref 22–32)
Calcium: 8.6 mg/dL — ABNORMAL LOW (ref 8.9–10.3)
Chloride: 97 mmol/L — ABNORMAL LOW (ref 98–111)
Creatinine, Ser: 5.26 mg/dL — ABNORMAL HIGH (ref 0.44–1.00)
GFR, Estimated: 8 mL/min — ABNORMAL LOW (ref >60)
Glucose, Bld: 98 mg/dL (ref 70–99)
Phosphorus: 3.7 mg/dL (ref 2.5–4.6)
Potassium: 3.4 mmol/L — ABNORMAL LOW (ref 3.5–5.1)
Sodium: 132 mmol/L — ABNORMAL LOW (ref 135–145)

## 2023-10-11 MED ORDER — CAMPHOR-MENTHOL 0.5-0.5 % EX LOTN
TOPICAL_LOTION | CUTANEOUS | Status: DC | PRN
  Filled 2023-10-11: qty 222

## 2023-10-11 MED ORDER — GADOBUTROL 1 MMOL/ML IV SOLN
10.0000 mL | Freq: Once | INTRAVENOUS | Status: AC | PRN
  Administered 2023-10-11: 10 mL via INTRAVENOUS

## 2023-10-11 MED ORDER — LORAZEPAM 0.5 MG PO TABS
0.5000 mg | ORAL_TABLET | Freq: Once | ORAL | Status: AC | PRN
  Administered 2023-10-11: 0.5 mg via ORAL
  Filled 2023-10-11: qty 1

## 2023-10-11 MED ORDER — SULFAMETHOXAZOLE-TRIMETHOPRIM 400-80 MG PO TABS
1.0000 | ORAL_TABLET | Freq: Two times a day (BID) | ORAL | Status: DC
  Administered 2023-10-12 (×2): 1 via ORAL
  Filled 2023-10-11 (×2): qty 1

## 2023-10-11 NOTE — Progress Notes (Signed)
 [facility]  ROUNDING NOTE   Subjective:   Amy Wu  is a 73 y.o.  female with past medical history of hypertension, diabetes, PVD, dyslipidemia, and end-stage renal disease on dialysis. She presents to the ED via EMS for evaluation of altered mental status and fever and has been admitted under observation for Acute respiratory failure with hypoxia (HCC) [J96.01] Sepsis (HCC) [A41.9] Sepsis, due to unspecified organism, unspecified whether acute organ dysfunction present [facility]) [A41.9]  Patient is known to our practice and receives outpatient dialysis treatments at [facility] on a MWF schedule, supervised by [facility] physicians.   Update:  Patient sitting up in bed Breakfast tray at bedside, barely touched Drowsy but arousable.    Objective:  Vital signs in last 24 hours:  Temp:  [97.6 F (36.4 C)-98.5 F (36.9 C)] 98 F (36.7 C) (04/24 0606) Pulse Rate:  [47-81] 79 (04/24 1106) Resp:  [13-20] 18 (04/24 1106) BP: (117-148)/(46-109) 117/46 (04/24 1106) SpO2:  [97 %-100 %] 100 % (04/24 1106) Weight:  [97.2 kg] 97.2 kg (04/24 0606)  Weight change:  Filed Weights   10/09/23 0235 10/10/23 0821 10/11/23 0606  Weight: 97.8 kg 95.2 kg 97.2 kg    Intake/Output: I/O last 3 completed shifts: In: 300 [P.O.:300] Out: 1600 [Other:1600]   Intake/Output this shift:  Total I/O In: 204.2 [P.O.:100; IV Piggyback:104.2] Out: -   Physical Exam: General: NAD  Head: Normocephalic, atraumatic.   Eyes: Anicteric  Lungs:  Diminished, [place] O2  Heart: Regular rate and rhythm  Abdomen:  Soft, nontender  Extremities: Trace Rt stump peripheral edema. Bilat AKA  Neurologic: Alert, moving all four extremities  Skin: No lesions  Access: Lt thigh permcath placed 4/18    Basic Metabolic Panel: Recent Labs  Lab 10/07/23 0153 10/08/23 0827 10/09/23 1057 10/10/23 0759 10/11/23 0435  NA 134* 133* 134* 131* 132*  K 3.5 3.2* 3.2* 3.2* 3.4*  CL 96* 98 98 97* 97*  CO2 24  23 23  21* 23  GLUCOSE 288* 155* 141* 148* 98  BUN 40* 51* 37* 41* 26*  CREATININE 6.13* 7.32* 5.83* 7.06* 5.26*  CALCIUM  8.8* 8.6* 9.1 8.8* 8.6*  PHOS  --  3.8  --  3.9 3.7    Liver Function Tests: Recent Labs  Lab 10/08/23 0827 10/10/23 0759 10/11/23 0435  ALBUMIN 2.5* 2.7* 2.6*   No results for input(s): "LIPASE", "AMYLASE" in the last 168 hours. No results for input(s): "AMMONIA" in the last 168 hours.   CBC: Recent Labs  Lab 10/07/23 0153 10/08/23 0551 10/08/23 0827 10/09/23 0620 10/10/23 0759  WBC 11.1* 12.4* 11.8* 11.1* 13.5*  HGB 10.9* 10.3* 10.1* 9.3* 8.9*  HCT 32.5* 30.6* 30.1* 28.0* 26.4*  MCV 77.4* 76.7* 77.0* 77.8* 74.4*  PLT 159 164 165 157 192    Cardiac Enzymes: No results for input(s): "CKTOTAL", "CKMB", "CKMBINDEX", "TROPONINI" in the last 168 hours.  BNP: Invalid input(s): "POCBNP"  CBG: Recent Labs  Lab 10/10/23 1510 10/10/23 1637 10/10/23 2137 10/11/23 0826 10/11/23 1139  GLUCAP 118* 125* 92 107* 146*    Microbiology: Results for orders placed or performed during the hospital encounter of 10/01/23  Resp panel by RT-PCR (RSV, Flu A&B, Covid) Anterior Nasal Swab     Status: None   Collection Time: 10/01/23  9:12 AM   Specimen: Anterior Nasal Swab  Result Value Ref Range Status   SARS Coronavirus 2 by RT PCR NEGATIVE NEGATIVE Final    Comment: (NOTE) SARS-CoV-2 target nucleic acids are  NOT DETECTED.  The SARS-CoV-2 RNA is generally detectable in upper respiratory specimens during the acute phase of infection. The lowest concentration of SARS-CoV-2 viral copies this assay can detect is 138 copies/mL. A negative result does not preclude SARS-Cov-2 infection and should not be used as the sole basis for treatment or other patient management decisions. A negative result may occur with  improper specimen collection/handling, submission of specimen other than nasopharyngeal swab, presence of viral mutation(s) within the areas targeted by  this assay, and inadequate number of viral copies(<138 copies/mL). A negative result must be combined with clinical observations, patient history, and epidemiological information. The expected result is Negative.  Fact Sheet for Patients:  BloggerCourse.com  Fact Sheet for Healthcare Providers:  SeriousBroker.it  This test is no t yet approved or cleared by the [place]  FDA and  has been authorized for detection and/or diagnosis of SARS-CoV-2 by FDA under an Emergency Use Authorization (EUA). This EUA will remain  in effect (meaning this test can be used) for the duration of the COVID-19 declaration under Section 564(b)(1) of the Act, 21 U.S.C.section 360bbb-3(b)(1), unless the authorization is terminated  or revoked sooner.       Influenza A by PCR NEGATIVE NEGATIVE Final   Influenza B by PCR NEGATIVE NEGATIVE Final    Comment: (NOTE) The Xpert Xpress SARS-CoV-2/FLU/RSV plus assay is intended as an aid in the diagnosis of influenza from Nasopharyngeal swab specimens and should not be used as a sole basis for treatment. Nasal washings and aspirates are unacceptable for Xpert Xpress SARS-CoV-2/FLU/RSV testing.  Fact Sheet for Patients: BloggerCourse.com  Fact Sheet for Healthcare Providers: SeriousBroker.it  This test is not yet approved or cleared by the [place]  FDA and has been authorized for detection and/or diagnosis of SARS-CoV-2 by FDA under an Emergency Use Authorization (EUA). This EUA will remain in effect (meaning this test can be used) for the duration of the COVID-19 declaration under Section 564(b)(1) of the Act, 21 U.S.C. section 360bbb-3(b)(1), unless the authorization is terminated or revoked.     Resp Syncytial Virus by PCR NEGATIVE NEGATIVE Final    Comment: (NOTE) Fact Sheet for Patients: BloggerCourse.com  Fact Sheet  for Healthcare Providers: SeriousBroker.it  This test is not yet approved or cleared by the [place]  FDA and has been authorized for detection and/or diagnosis of SARS-CoV-2 by FDA under an Emergency Use Authorization (EUA). This EUA will remain in effect (meaning this test can be used) for the duration of the COVID-19 declaration under Section 564(b)(1) of the Act, 21 U.S.C. section 360bbb-3(b)(1), unless the authorization is terminated or revoked.  Performed at [facility], [address], [place], [place] [address]   Blood Culture (routine x 2)     Status: Abnormal   Collection Time: 10/01/23  9:12 AM   Specimen: BLOOD  Result Value Ref Range Status   Specimen Description   Final    BLOOD BLOOD LEFT FOREARM Performed at [facility], [address], [place], [place] [address]    Special Requests   Final    BOTTLES DRAWN AEROBIC AND ANAEROBIC Blood Culture results may not be optimal due to an inadequate volume of blood received in culture bottles Performed at [facility], [address] [place], [place] [address]    Culture  Setup Time   Final    GRAM NEGATIVE RODS IN BOTH AEROBIC AND ANAEROBIC BOTTLES CRITICAL VALUE NOTED.  VALUE IS CONSISTENT WITH PREVIOUSLY REPORTED AND CALLED VALUE. Performed at  [facility], [address], [place], [place] [address]    Culture (A)  Final    SERRATIA MARCESCENS SUSCEPTIBILITIES PERFORMED ON PREVIOUS CULTURE WITHIN THE LAST 5 DAYS. Performed at [facility], [address], [place], [place] [address]    Report Status 10/04/2023 FINAL  Final  Respiratory (~20 pathogens) panel by PCR     Status: None   Collection Time: 10/01/23  9:12 AM   Specimen: Nasopharyngeal Swab; Respiratory  Result Value Ref Range Status   Adenovirus NOT DETECTED NOT DETECTED Final   Coronavirus 229E NOT DETECTED NOT DETECTED Final    Comment: (NOTE) The Coronavirus on the  Respiratory Panel, DOES NOT test for the novel  Coronavirus (2019 nCoV)    Coronavirus HKU1 NOT DETECTED NOT DETECTED Final   Coronavirus NL63 NOT DETECTED NOT DETECTED Final   Coronavirus OC43 NOT DETECTED NOT DETECTED Final   Metapneumovirus NOT DETECTED NOT DETECTED Final   Rhinovirus / Enterovirus NOT DETECTED NOT DETECTED Final   Influenza A NOT DETECTED NOT DETECTED Final   Influenza B NOT DETECTED NOT DETECTED Final   Parainfluenza Virus 1 NOT DETECTED NOT DETECTED Final   Parainfluenza Virus 2 NOT DETECTED NOT DETECTED Final   Parainfluenza Virus 3 NOT DETECTED NOT DETECTED Final   Parainfluenza Virus 4 NOT DETECTED NOT DETECTED Final   Respiratory Syncytial Virus NOT DETECTED NOT DETECTED Final   Bordetella pertussis NOT DETECTED NOT DETECTED Final   Bordetella Parapertussis NOT DETECTED NOT DETECTED Final   Chlamydophila pneumoniae NOT DETECTED NOT DETECTED Final   Mycoplasma pneumoniae NOT DETECTED NOT DETECTED Final    Comment: Performed at [facility], [address] [place], [place] [address]  Blood Culture (routine x 2)     Status: Abnormal   Collection Time: 10/01/23  9:33 AM   Specimen: BLOOD  Result Value Ref Range Status   Specimen Description   Final    BLOOD RIGHT WRIST Performed at [facility], [address], [place], [place] [address]    Special Requests   Final    BOTTLES DRAWN AEROBIC AND ANAEROBIC Blood Culture adequate volume Performed at [facility], [address], [place], [place] [address]    Culture  Setup Time   Final    GRAM NEGATIVE RODS IN BOTH AEROBIC AND ANAEROBIC BOTTLES CRITICAL RESULT CALLED TO, READ BACK BY AND VERIFIED WITH:  NATHAN BELUE AT 0457 10/02/23 JG Performed at [facility], [address], [place], [place] [address]    Culture SERRATIA MARCESCENS (A)  Final   Report Status 10/04/2023 FINAL  Final   Organism ID, Bacteria SERRATIA MARCESCENS  Final      Susceptibility   Serratia  marcescens - MIC*    CEFEPIME  <=0.12 SENSITIVE Sensitive     CEFTAZIDIME <=1 SENSITIVE Sensitive     CEFTRIAXONE  <=0.25 SENSITIVE Sensitive     CIPROFLOXACIN  <=0.25 SENSITIVE Sensitive     GENTAMICIN <=1 SENSITIVE Sensitive     TRIMETH /SULFA  <=20 SENSITIVE Sensitive     * SERRATIA MARCESCENS  Blood Culture ID Panel (Reflexed)     Status: Abnormal   Collection Time: 10/01/23  9:33 AM  Result Value Ref Range Status   Enterococcus faecalis NOT DETECTED NOT DETECTED Final   Enterococcus Faecium NOT DETECTED NOT DETECTED Final   Listeria monocytogenes NOT DETECTED NOT DETECTED Final   Staphylococcus species NOT DETECTED NOT DETECTED Final   Staphylococcus aureus (BCID) NOT DETECTED NOT DETECTED Final   Staphylococcus  epidermidis NOT DETECTED NOT DETECTED Final   Staphylococcus lugdunensis NOT DETECTED NOT DETECTED Final   Streptococcus species NOT DETECTED NOT DETECTED Final   Streptococcus agalactiae NOT DETECTED NOT DETECTED Final   Streptococcus pneumoniae NOT DETECTED NOT DETECTED Final   Streptococcus pyogenes NOT DETECTED NOT DETECTED Final   A.calcoaceticus-baumannii NOT DETECTED NOT DETECTED Final   Bacteroides fragilis NOT DETECTED NOT DETECTED Final   Enterobacterales DETECTED (A) NOT DETECTED Final    Comment: Enterobacterales represent a large order of gram negative bacteria, not a single organism. CRITICAL RESULT CALLED TO, READ BACK BY AND VERIFIED WITH:  NATHAN BELUE AT 0457 10/02/23 JG    Enterobacter cloacae complex NOT DETECTED NOT DETECTED Final   Escherichia coli NOT DETECTED NOT DETECTED Final   Klebsiella aerogenes NOT DETECTED NOT DETECTED Final   Klebsiella oxytoca NOT DETECTED NOT DETECTED Final   Klebsiella pneumoniae NOT DETECTED NOT DETECTED Final   Proteus species NOT DETECTED NOT DETECTED Final   Salmonella species NOT DETECTED NOT DETECTED Final   Serratia marcescens DETECTED (A) NOT DETECTED Final    Comment: CRITICAL RESULT CALLED TO, READ BACK BY AND  VERIFIED WITH:  NATHAN BELUE AT 0457 10/02/23 JG    Haemophilus influenzae NOT DETECTED NOT DETECTED Final   Neisseria meningitidis NOT DETECTED NOT DETECTED Final   Pseudomonas aeruginosa NOT DETECTED NOT DETECTED Final   Stenotrophomonas maltophilia NOT DETECTED NOT DETECTED Final   Candida albicans NOT DETECTED NOT DETECTED Final   Candida auris NOT DETECTED NOT DETECTED Final   Candida glabrata NOT DETECTED NOT DETECTED Final   Candida krusei NOT DETECTED NOT DETECTED Final   Candida parapsilosis NOT DETECTED NOT DETECTED Final   Candida tropicalis NOT DETECTED NOT DETECTED Final   Cryptococcus neoformans/gattii NOT DETECTED NOT DETECTED Final   CTX-M ESBL NOT DETECTED NOT DETECTED Final   Carbapenem resistance IMP NOT DETECTED NOT DETECTED Final   Carbapenem resistance KPC NOT DETECTED NOT DETECTED Final   Carbapenem resistance NDM NOT DETECTED NOT DETECTED Final   Carbapenem resist OXA 48 LIKE NOT DETECTED NOT DETECTED Final   Carbapenem resistance VIM NOT DETECTED NOT DETECTED Final    Comment: Performed at [facility], [address] [place], [place] [address]  MRSA Next Gen by PCR, Nasal     Status: None   Collection Time: 10/02/23  5:26 PM   Specimen: Nasal Mucosa; Nasal Swab  Result Value Ref Range Status   MRSA by PCR Next Gen NOT DETECTED NOT DETECTED Final    Comment: (NOTE) The GeneXpert MRSA Assay (FDA approved for NASAL specimens only), is one component of a comprehensive MRSA colonization surveillance program. It is not intended to diagnose MRSA infection nor to guide or monitor treatment for MRSA infections. Test performance is not FDA approved in patients less than 54 years old. Performed at [facility], [address] [place], [place] [address]   Cath Tip Culture     Status: Abnormal   Collection Time: 10/02/23  9:28 PM   Specimen: Catheter Tip; Other  Result Value Ref Range Status   Specimen Description   Final    CATH  TIP Performed at [facility], [address], [place], [place] [address]    Special Requests   Final    NONE Performed at [facility], [address] [place], [place] [address]    Culture >=100,000 COLONIES/mL SERRATIA MARCESCENS (A)  Final   Report Status 10/08/2023 FINAL  Final   Organism ID, Bacteria  SERRATIA MARCESCENS (A)  Final      Susceptibility   Serratia marcescens - MIC*    CEFEPIME  <=0.12 SENSITIVE Sensitive     CEFTAZIDIME <=1 SENSITIVE Sensitive     CEFTRIAXONE  <=0.25 SENSITIVE Sensitive     CIPROFLOXACIN  <=0.25 SENSITIVE Sensitive     GENTAMICIN <=1 SENSITIVE Sensitive     TRIMETH /SULFA  <=20 SENSITIVE Sensitive     * >=100,000 COLONIES/mL SERRATIA MARCESCENS  Culture, blood (Routine X 2) w Reflex to ID Panel     Status: None   Collection Time: 10/03/23  1:00 PM   Specimen: BLOOD  Result Value Ref Range Status   Specimen Description BLOOD BLOOD LEFT WRIST  Final   Special Requests   Final    BOTTLES DRAWN AEROBIC ONLY Blood Culture adequate volume   Culture   Final    NO GROWTH 5 DAYS Performed at [facility], [address] [place], [place] [address]    Report Status 10/08/2023 FINAL  Final  Culture, blood (Routine X 2) w Reflex to ID Panel     Status: None   Collection Time: 10/03/23  1:04 PM   Specimen: BLOOD  Result Value Ref Range Status   Specimen Description BLOOD BLOOD LEFT FOREARM  Final   Special Requests   Final    BOTTLES DRAWN AEROBIC ONLY Blood Culture adequate volume   Culture   Final    NO GROWTH 5 DAYS Performed at [facility], [address] [place], [place] [address]    Report Status 10/08/2023 FINAL  Final    Coagulation Studies: No results for input(s): "LABPROT", "INR" in the last 72 hours.    Urinalysis: No results for input(s): "COLORURINE", "LABSPEC", "PHURINE", "GLUCOSEU", "HGBUR", "BILIRUBINUR", "KETONESUR", "PROTEINUR", "UROBILINOGEN", "NITRITE", "LEUKOCYTESUR" in the last 72  hours.  Invalid input(s): "APPERANCEUR"    Imaging: No results found.      Medications:    cefTRIAXone  (ROCEPHIN )  IV Stopped (10/11/23 0034)     amiodarone   200 mg Oral BID   apixaban   5 mg Oral BID   atorvastatin   80 mg Oral Daily   Chlorhexidine  Gluconate Cloth  6 each Topical Q0600   epoetin  alfa-epbx (RETACRIT ) injection  4,000 Units Intravenous Q M,W,F-1800   insulin  aspart  0-15 Units Subcutaneous TID WC   insulin  aspart  3 Units Subcutaneous TID WC   acetaminophen , gabapentin , Gerhardt's butt cream, ondansetron  **OR** ondansetron  (ZOFRAN ) IV  Assessment/ Plan:  Amy Wu is a 73 y.o.  female with past medical history of hypertension, diabetes, PVD, dyslipidemia, and end-stage renal disease on dialysis. She presents to the ED via EMS for evaluation of altered mental status and fever and has been admitted under observation for Acute respiratory failure with hypoxia (HCC) [J96.01] Sepsis (HCC) [A41.9] Sepsis, due to unspecified organism, unspecified whether acute organ dysfunction present (HCC) [A41.9]  [facility]/MWF/Rt thigh Permcath  End stage renal disease on hemodialysis.  Received dialysis treatment yesterday, UF 1.6L achieved. Lt thigh permcath required cathflo yesterday and continue to have multiple alarms and decreased blood flow during treatment. Vascular notified, will follow. Next treatment scheduled for Friday.   Sepsis, secondary to line infection. Admitted with fever 103, and elevated white count, 12.2. Blood culture positive for serratia marcescens. ID request removal of tunneled catheter, removed on 4/15 with tip sent for culture. Catheter positive for same bacteria.    Appreciate vascular replacing HD permcath. Due to confusion, antibiotics are now transitioned back to Ceftriaxone  with dialysis. Awaiting  dosing.   3. Acute respiratory failure requiring Bipap on admission d/t volume overload.  Weaned to room air.  4. Anemia of  chronic kidney disease Lab Results  Component Value Date   HGB 8.9 (L) 10/10/2023  Hemoglobin 8.9. Mircera as outpatient. Continue low dose EPO with treatment.   5. Secondary Hyperparathyroidism: with outpatient labs: None available   Lab Results  Component Value Date   CALCIUM  8.6 (L) 10/11/2023   PHOS 3.7 10/11/2023    Continue periodically monitor bone metabolism parameters as outpatient.  Calcium  and phosphorus acceptable.    LOS: 9 Climmie Cronce 4/24/202512:41 PM

## 2023-10-11 NOTE — Plan of Care (Signed)

## 2023-10-11 NOTE — Progress Notes (Signed)
 PT Cancellation Note  Patient Details Name: Amy Wu MRN: [id number] DOB: December 13, 1950  Late entry for treatment attempt PM of 10/10/23  Cancelled Treatment:     Attempted to see pt this afternoon after dialysis.  She was trying to eat dinner, significant other in the room.  Pt was unable to verbalize throughout our encounter and had difficulty organizing appropriate management of her meal putting food in her mouth, chewing it partially and then spitting out repeatedly.  Pt unable to make/maintain eye contact, answer questions or show much insight into situation at all.  Significant other reports she has had altered mental status for a few days, tried to encourage her to do some exercises with PT but met only with blank stare.  Pt not appropriate for PT this date, he was in agreement.  Will maintain on caseload and continue to attempt to see as appropriate.     Darice Edelman 10/11/2023, 7:55 AM

## 2023-10-11 NOTE — Progress Notes (Signed)
 PROGRESS NOTE  Amy Wu    DOB: 07/03/1950, 73 y.o.  XBM:[id number]    Code Status: Limited: Do not attempt resuscitation (DNR) -DNR-LIMITED -Do Not Intubate/DNI    DOA: 10/01/2023   LOS: 9   Brief hospital course  Amy Wu is a 73 y.o. female with medical history significant of end-stage renal disease on hemodialysis MWF, type 2 diabetes, hyperlipidemia, peripheral vascular disease presenting to ED 10/01/23 from home with generalized malaise and fevers, confusion per family, mild cough/SOB. No abdominal pain but some diarrhea. Missed hemodialysis 04/13 secondary to symptoms.    04/14: admitted to hospitalist service for sepsis as well as acute resp fail w/ hypoxia and concern for volume overload. Requiring BiPAP  04/15: (+)bacteremia w/ serratia, abx to cefepime . Pt transferred to the stepdown unit for pressors. ID consult, HD catheter removed due to likely source of sepsis. Elevated troponins, started heparin   04/16: Off levo later today and onto scheduled midodrine . Echo ordered. Cardiology consulted, continue heparin  infusion, not on beta-blocker secondary to bradycardia, scheduled for cardiac cath on 04/17. Transthoracic echo ok. Repeating BCx today. Plan reinsertion HD cath 04/18.  04/17: LHC today. Significant CAD, no PCI 04/18: L femoral dialysis cath placed, planning dialysis tomorrow. Glc substantial increase, adjusting sliding scale  04/19: New onset atrial flutter, cardiology following, started heparin  and amiodarone . Dialysis today. Still quite hyperglycemic.  04/20: converted to sinus. Son unable to get meds from pharmacy, pt will need Eliquis  - will plan dialysis tomorrow, transition to eliquis  and confirm home meds access and DME, hopefully will be able to dc tomorrow  04/21: hospital bed will be delivered tomorrow and [facility] can transport in AM, anticipate home tomorrow.   Presently- concerns for several days that patient is intermittently disoriented. Has had negative  stroke workup and change of antibiotics due to possible contribution to encephalopathy. Other reversible causes reasonably ruled out. At mental baseline according to her son. She is otherwise medically clear for dc.  Will plan to dc home tomorrow.   Assessment & Plan  Principal Problem:   Sepsis (HCC) Active Problems:   Acute respiratory failure with hypoxia (HCC)   ESRD on dialysis (HCC)   Encephalopathy   Type 2 diabetes mellitus (HCC)   Hyperlipidemia, unspecified   ESRD (end stage renal disease) (HCC)   Non-ST elevation (NSTEMI) myocardial infarction (HCC)   Elevated troponin I level   Bacteremia   Typical atrial flutter (HCC)   Serratia marcescens infection   Bacteremia associated with intravascular line (HCC)  Metabolic encephalopathy- possible effect of cefepime > changed to levaquin  but still confused. Now on bactrim . Appears to be at baseline. Per ID, no concerns for meningitis in setting of the MRI findings.  Likely component of vascular dementia- no acute changes on MRI but remote micro hemorrhages. Changed to bactrim  per ID recs Delirium precautions    Severe Sepsis w/ Serratia bacteremia likely source HD catheter which has been removed Complicated by septic shock, see below  s/p HD catheter placement 10/05/23 following neg repeat BCx Cefepime  changed to levaquin  > bactrim  per ID until 5/1   Septic shock requiring vasopressor support, shock resolved and now off IV pressors- resolved.  Midodrine  dc'd   New atrial flutter- New onset 04/19. Most recent ECG was sinus rhythm. Rate controlled in the 70s Asymptomatic amiodarone  200 twice daily x 2 weeks then down to 200 daily per cardiology consult Eliquis  prescribed. Might be able to discontinue in future if arrhythmia was provoked   Elevated troponin secondary  NSTEMI vs. demand ischemia  Coronary Artery Disease w/ extensive calcification / atherosclerotic disease  Hx: HTN, HLD and PVD Echo 10/02/23: EF >55%; mild  tricuspid valve regurgitation; trivial pulmonic valve regurgitation  Cardiac catheterization was attempted 10/04/23 but was very challenging due to difficult vascular anatomy. Only the right coronary artery could be engaged and was found to have sequential 80 to 90% lesions.  No ASA, on Eliquis  atorvastatin   Beta blocker has caused bradycardia/hypotension, defer this medication    Acute hypoxic respiratory failure - improved  Likely d/t pulmonary vascular congestion d/t missed dialysis Supplemental O2 for dyspnea and/or hypoxia  Maintain O2 sats 92% or higher  Fluid removal with HD     ESRD on HD (M-W-F) Hyponatremia s/p HD catheter placement 10/05/23 Trend BMP per nephrology / as needed  Replace electrolytes as indicated  Nephrology consulted appreciate input   Elevated AST  Trend hepatic function panel  Avoid hepatotoxic agents as able    Type II diabetes mellitus  PT had previously been d/c insulin  outpatient d/t severe hypoglycemia, was normoglycemic initially and A1C <7 however Glc past few days has been quite high, diet has been non-carb-controlled, question d/t steroid dose, has been coming down  CBG's ac/hs/prn diet to renal/carb   SSI decreased  Target CBG's 140 to 180   Body mass index is 46.37 kg/m.  VTE ppx:  apixaban  (ELIQUIS ) tablet 5 mg   Diet:     Diet   Diet renal/carb modified with fluid restriction Diet-HS Snack? Nothing; Fluid restriction: 1500 mL Fluid; Room service appropriate? Yes; Fluid consistency: Thin   Consultants: Nephrology Vascular surgery ID CCM Cardiology   Subjective 10/11/23    Pt reports no complaints. She denies pain, SOB. Asks several times about her head scan.    Objective   Vitals:   10/10/23 2002 10/10/23 2352 10/11/23 0606 10/11/23 1106  BP: (!) 143/67  136/64 (!) 117/46  Pulse: 81  72 79  Resp: 18 13 20 18   Temp: 98.5 F (36.9 C)  98 F (36.7 C)   TempSrc: Oral     SpO2: 97%  99% 100%  Weight:   97.2 kg    Height:        Intake/Output Summary (Last 24 hours) at 10/11/2023 1716 Last data filed at 10/11/2023 1300 Gross per 24 hour  Intake 304.22 ml  Output --  Net 304.22 ml   Filed Weights   10/09/23 0235 10/10/23 0821 10/11/23 0606  Weight: 97.8 kg 95.2 kg 97.2 kg     Physical Exam:  General: awake, alert, NAD. HEENT: atraumatic, clear conjunctiva, anicteric sclera, MMM, hearing grossly normal Respiratory: normal respiratory effort. Cardiovascular: quick capillary refill, normal S1/S2, RRR, no JVD, murmurs Gastrointestinal: soft, NT, ND Nervous: A&O x3. no gross focal neurologic deficits, intermittently has word finding difficulty and pauses in speech  Extremities: moves all equally, no edema, normal tone Skin: dry, intact, normal temperature, normal color. No rashes, lesions or ulcers on exposed skin  Labs   I have personally reviewed the following labs and imaging studies CBC    Component Value Date/Time   WBC 13.5 (H) 10/10/2023 0759   RBC 3.55 (L) 10/10/2023 0759   HGB 8.9 (L) 10/10/2023 0759   HGB 12.4 12/03/2012 1414   HCT 26.4 (L) 10/10/2023 0759   HCT 38.0 12/03/2012 1414   PLT 192 10/10/2023 0759   PLT 193 12/03/2012 1414   MCV 74.4 (L) 10/10/2023 0759   MCV 81 12/03/2012 1414   MCH 25.1 (  L) 10/10/2023 0759   MCHC 33.7 10/10/2023 0759   RDW 17.2 (H) 10/10/2023 0759   RDW 17.7 (H) 12/03/2012 1414   LYMPHSABS 0.7 10/01/2023 0912   MONOABS 0.8 10/01/2023 0912   EOSABS 0.0 10/01/2023 0912   BASOSABS 0.0 10/01/2023 0912      Latest Ref Rng & Units 10/11/2023    4:35 AM 10/10/2023    7:59 AM 10/09/2023   10:57 AM  BMP  Glucose 70 - 99 mg/dL 98  578  469   BUN 8 - 23 mg/dL 26  41  37   Creatinine 0.44 - 1.00 mg/dL 6.29  5.28  4.13   Sodium 135 - 145 mmol/L 132  131  134   Potassium 3.5 - 5.1 mmol/L 3.4  3.2  3.2   Chloride 98 - 111 mmol/L 97  97  98   CO2 22 - 32 mmol/L 23  21  23    Calcium  8.9 - 10.3 mg/dL 8.6  8.8  9.1     MR BRAIN W WO CONTRAST Result  Date: 10/11/2023 CLINICAL DATA:  Provided history: Mental status change, persistent or worsening. EXAM: MRI HEAD WITHOUT AND WITH CONTRAST TECHNIQUE: Multiplanar, multiecho pulse sequences of the brain and surrounding structures were obtained without and with intravenous contrast. CONTRAST:  10mL GADAVIST  GADOBUTROL  1 MMOL/ML IV SOLN COMPARISON:  Head CT 10/09/2023.  Brain MRI 05/24/2021. FINDINGS: FINDINGS : Brain: Mild generalized cerebral atrophy. 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 Multifocal T2 FLAIR hyperintense signal abnormality within the cerebral white matter, nonspecific but compatible with mild chronic small vessel ischemic disease. 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 Chronic microhemorrhages scattered within the brainstem and supratentorial brain. There is no acute infarct. No evidence of an intracranial mass. No extra-axial fluid collection. No midline shift. Mild but diffuse smooth pachymeningeal dural thickening and enhancement along the bilateral cerebral convexities. No pathologic parenchymal enhancement. Vascular: Maintained flow voids within the proximal large arterial vessels. Skull and upper cervical spine: Abnormal T1 hypointense marrow signal within the calvarium and within visible portions of the cervical spine. Sinuses/Orbits: No mass or acute finding within the imaged orbits. Prior ocular lens replacement. Right scleral buckle. No significant paranasal sinus disease. IMPRESSION: 1. No evidence of an acute or recent subacute infarction. 2. Mild but diffuse smooth pachymeningeal dural thickening and enhancement along the bilateral cerebral convexities, nonspecific. Consider lumbar puncture and CSF analysis if there is concern for infectious meningitis. 3. Small chronic infarcts within the left parietal cortex and bilateral cerebellar hemispheres, unchanged from the prior  brain MRI of 05/24/2021. 4. Mild chronic small ischemic changes within the cerebral white matter. 5. Chronic microhemorrhages scattered within the brainstem and supratentorial brain. Findings likely at least partly reflect sequelae of chronic hypertensive microangiopathy. However, superimposed manifestations of cerebral amyloid angiopathy cannot be excluded. 6. Mild generalized cerebral atrophy. 7. Abnormal T1 hypointense marrow signal within the calvarium and within visible portions of the cervical spine. While this finding can reflect a marrow infiltrative process, the most common causes include chronic anemia, smoking and obesity. Electronically Signed   By: Bascom Lily D.O.   On: 10/11/2023 15:58    Disposition Plan & Communication  Patient status: Inpatient  Admitted From: Home Planned disposition location: Home Anticipated discharge date: TBD pending clinical stability   Family Communication: son on phone    Author: Ree Candy, DO [facility] 10/11/2023, 5:16 PM   Available by  Epic secure chat 7AM-7PM. If 7PM-7AM, please contact night-coverage.  [facility] contact information found on [email or web address].

## 2023-10-11 NOTE — Progress Notes (Signed)
 Date of Admission:  10/01/2023      ID: Amy Wu is a 73 y.o. female Principal Problem:   Sepsis (HCC) Active Problems:   Type 2 diabetes mellitus (HCC)   Hyperlipidemia, unspecified   ESRD (end stage renal disease) (HCC)   Non-ST elevation (NSTEMI) myocardial infarction (HCC)   ESRD on dialysis (HCC)   Acute respiratory failure with hypoxia (HCC)   Encephalopathy   Elevated troponin I level   Bacteremia   Typical atrial flutter (HCC)   Serratia marcescens infection   Bacteremia associated with intravascular line (HCC)    Subjective: Pt is alert Family at bed side No headache No fever No pain  Medications:   amiodarone   200 mg Oral BID   apixaban   5 mg Oral BID   atorvastatin   80 mg Oral Daily   Chlorhexidine  Gluconate Cloth  6 each Topical Q0600   epoetin  alfa-epbx (RETACRIT ) injection  4,000 Units Intravenous Q M,W,F-1800   insulin  aspart  0-15 Units Subcutaneous TID WC   insulin  aspart  3 Units Subcutaneous TID WC   [START ON 10/12/2023] sulfamethoxazole -trimethoprim   1 tablet Oral Q12H    Objective: Vital signs in last 24 hours: Patient Vitals for the past 24 hrs:  BP Temp Temp src Pulse Resp SpO2 Weight  10/11/23 1106 (!) 117/46 -- -- 79 18 100 % --  10/11/23 0606 136/64 98 F (36.7 C) -- 72 20 99 % 97.2 kg  10/10/23 2352 -- -- -- -- 13 -- --  10/10/23 2002 (!) 143/67 98.5 F (36.9 C) Oral 81 18 97 % --      PHYSICAL EXAM:  General: awake, oriented in person Verbal and repsonds appropriately   Lungs: b/l air entry Heart: bradycardia. Abdomen: Soft, non-tender,not distended. Bowel sounds normal. No masses Extremities: rt AKA, left BKA Left groin cath Skin: No rashes or lesions. Or bruising Neurologic: Grossly non-focal  Lab Results    Latest Ref Rng & Units 10/10/2023    7:59 AM 10/09/2023    6:20 AM 10/08/2023    8:27 AM  CBC  WBC 4.0 - 10.5 K/uL 13.5  11.1  11.8   Hemoglobin 12.0 - 15.0 g/dL 8.9  9.3  08.6   Hematocrit 36.0 - 46.0 %  26.4  28.0  30.1   Platelets 150 - 400 K/uL 192  157  165        Latest Ref Rng & Units 10/11/2023    4:35 AM 10/10/2023    7:59 AM 10/09/2023   10:57 AM  CMP  Glucose 70 - 99 mg/dL 98  578  469   BUN 8 - 23 mg/dL 26  41  37   Creatinine 0.44 - 1.00 mg/dL 6.29  5.28  4.13   Sodium 135 - 145 mmol/L 132  131  134   Potassium 3.5 - 5.1 mmol/L 3.4  3.2  3.2   Chloride 98 - 111 mmol/L 97  97  98   CO2 22 - 32 mmol/L 23  21  23    Calcium  8.9 - 10.3 mg/dL 8.6  8.8  9.1       Microbiology: 10/01/23 BC Serratia bacteremia 4/15 cath tip serratia 4/16 BC- NG  Studies/Results: MR BRAIN W WO CONTRAST Result Date: 10/11/2023 CLINICAL DATA:  Provided history: Mental status change, persistent or worsening. EXAM: MRI HEAD WITHOUT AND WITH CONTRAST TECHNIQUE: Multiplanar, multiecho pulse sequences of the brain and surrounding structures were obtained without and with intravenous contrast. CONTRAST:  10mL GADAVIST  GADOBUTROL   1 MMOL/ML IV SOLN COMPARISON:  Head CT 10/09/2023.  Brain MRI 05/24/2021. FINDINGS: FINDINGS : Brain: Mild generalized cerebral atrophy. 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 Multifocal T2 FLAIR hyperintense signal abnormality within the cerebral white matter, nonspecific but compatible with mild chronic small vessel ischemic disease. 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 Chronic microhemorrhages scattered within the brainstem and supratentorial brain. There is no acute infarct. No evidence of an intracranial mass. No extra-axial fluid collection. No midline shift. Mild but diffuse smooth pachymeningeal dural thickening and enhancement along the bilateral cerebral convexities. No pathologic parenchymal enhancement. Vascular: Maintained flow voids within the proximal large arterial vessels. Skull and upper cervical spine: Abnormal T1 hypointense marrow signal within the  calvarium and within visible portions of the cervical spine. Sinuses/Orbits: No mass or acute finding within the imaged orbits. Prior ocular lens replacement. Right scleral buckle. No significant paranasal sinus disease. IMPRESSION: 1. No evidence of an acute or recent subacute infarction. 2. Mild but diffuse smooth pachymeningeal dural thickening and enhancement along the bilateral cerebral convexities, nonspecific. Consider lumbar puncture and CSF analysis if there is concern for infectious meningitis. 3. 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 4. Mild chronic small ischemic changes within the cerebral white matter. 5. Chronic microhemorrhages scattered within the brainstem and supratentorial brain. Findings likely at least partly reflect sequelae of chronic hypertensive microangiopathy. However, superimposed manifestations of cerebral amyloid angiopathy cannot be excluded. 6. Mild generalized cerebral atrophy. 7. Abnormal T1 hypointense marrow signal within the calvarium and within visible portions of the cervical spine. While this finding can reflect a marrow infiltrative process, the most common causes include chronic anemia, smoking and obesity. Electronically Signed   By: Bascom Lily D.O.   On: 10/11/2023 15:58     Assessment/Plan:   Acute hypoxic resp failure -resolved   Serratia bacteremia  related to HD catheter -catheter was removed and tip is serratia 2 d echo valves look okay Repeat blood culture neg so far  Is On cefepime  -  Plan was to treat for 2 weeks from the neg blood culture 10/16/23 New encephalopathy since 10/09/23 Likely due to cefepime  and it has resolved She is currently on ceftriaxone  On discharge can switch to Po bactrim  SS BID for 5 more days  Cefepime  side effect will be entered in allergy   MRI done today shows non specific Mild  diffuse smooth pachymeningeal dural  thickening and enhancement along the bilateral cerebral convexities. No pathologic parenchymal enhancement. Chronic micro hemorrhages Chronic infarcts  Clinically no concern for meningitis or encephalitis      ESRD on HD PAD DM Rt AKA and Left BKA   Anemia Thrombocytopenia improved   Discussed the management with patient, her son at bed side, her husband and brother and the hospitalist   ID will sign off - call if needed

## 2023-10-11 NOTE — TOC Progression Note (Signed)
 Transition of Care [facility]) - Progression Note    Patient Details  Name: Amy Wu MRN: [id number] Date of Birth: 07-05-1950  [facility]) CM/SW Contact  Odilia Bennett, LCSW Phone Number: 10/11/2023, 12:22 PM  Clinical Narrative:   Per MD, patient will need IV abx at HD. Nephrology NP is aware and will notify HD clinic.  Expected Discharge Plan and Services                                               Social Determinants of Health (SDOH) Interventions SDOH Screenings   Food Insecurity: No Food Insecurity (10/01/2023)  Housing: Low Risk  (10/01/2023)  Transportation Needs: No Transportation Needs (10/01/2023)  Utilities: Not At Risk (10/01/2023)  Social Connections: Moderately Isolated (10/01/2023)  Tobacco Use: Low Risk  (10/01/2023)    Readmission Risk Interventions    05/26/2021    1:38 PM 05/24/2021    3:35 PM  Readmission Risk Prevention Plan  Transportation Screening Complete Complete  PCP or Specialist Appt within 3-5 Days Complete Complete  HRI or Home Care Consult  Complete  Social Work Consult for Recovery Care Planning/Counseling Complete   Palliative Care Screening Not Applicable Not Applicable  Medication Review [employer]) Complete Complete

## 2023-10-12 DIAGNOSIS — J9601 Acute respiratory failure with hypoxia: Secondary | ICD-10-CM | POA: Diagnosis not present

## 2023-10-12 DIAGNOSIS — A419 Sepsis, unspecified organism: Secondary | ICD-10-CM | POA: Diagnosis not present

## 2023-10-12 DIAGNOSIS — N186 End stage renal disease: Secondary | ICD-10-CM | POA: Diagnosis not present

## 2023-10-12 LAB — RENAL FUNCTION PANEL
Albumin: 2.6 g/dL — ABNORMAL LOW (ref 3.5–5.0)
Anion gap: 14 (ref 5–15)
BUN: 33 mg/dL — ABNORMAL HIGH (ref 8–23)
CO2: 22 mmol/L (ref 22–32)
Calcium: 8.5 mg/dL — ABNORMAL LOW (ref 8.9–10.3)
Chloride: 97 mmol/L — ABNORMAL LOW (ref 98–111)
Creatinine, Ser: 6.16 mg/dL — ABNORMAL HIGH (ref 0.44–1.00)
GFR, Estimated: 7 mL/min — ABNORMAL LOW (ref >60)
Glucose, Bld: 155 mg/dL — ABNORMAL HIGH (ref 70–99)
Phosphorus: 4.2 mg/dL (ref 2.5–4.6)
Potassium: 3.2 mmol/L — ABNORMAL LOW (ref 3.5–5.1)
Sodium: 133 mmol/L — ABNORMAL LOW (ref 135–145)

## 2023-10-12 LAB — CBC
HCT: 25.3 % — ABNORMAL LOW (ref 36.0–46.0)
Hemoglobin: 8.7 g/dL — ABNORMAL LOW (ref 12.0–15.0)
MCH: 26.2 pg (ref 26.0–34.0)
MCHC: 34.4 g/dL (ref 30.0–36.0)
MCV: 76.2 fL — ABNORMAL LOW (ref 80.0–100.0)
Platelets: 228 10*3/uL (ref 150–400)
RBC: 3.32 MIL/uL — ABNORMAL LOW (ref 3.87–5.11)
RDW: 17.7 % — ABNORMAL HIGH (ref 11.5–15.5)
WBC: 11.5 10*3/uL — ABNORMAL HIGH (ref 4.0–10.5)
nRBC: 0.3 % — ABNORMAL HIGH (ref 0.0–0.2)

## 2023-10-12 LAB — GLUCOSE, CAPILLARY
Glucose-Capillary: 147 mg/dL — ABNORMAL HIGH (ref 70–99)
Glucose-Capillary: 245 mg/dL — ABNORMAL HIGH (ref 70–99)
Glucose-Capillary: 211 mg/dL — ABNORMAL HIGH (ref 70–99)

## 2023-10-12 MED ORDER — HEPARIN SODIUM (PORCINE) 1000 UNIT/ML IJ SOLN
INTRAMUSCULAR | Status: AC
  Filled 2023-10-12: qty 10

## 2023-10-12 MED ORDER — ALTEPLASE 2 MG IJ SOLR
INTRAMUSCULAR | Status: AC
  Filled 2023-10-12: qty 4

## 2023-10-12 MED ORDER — PREDNISONE 20 MG PO TABS
20.0000 mg | ORAL_TABLET | Freq: Every day | ORAL | Status: DC
  Administered 2023-10-12: 20 mg via ORAL
  Filled 2023-10-12: qty 1

## 2023-10-12 MED ORDER — STERILE WATER FOR INJECTION IJ SOLN
INTRAMUSCULAR | Status: AC
  Filled 2023-10-12: qty 10

## 2023-10-12 MED ORDER — ALTEPLASE 2 MG IJ SOLR
2.0000 mg | Freq: Once | INTRAMUSCULAR | Status: AC | PRN
  Administered 2023-10-12: 2 mg

## 2023-10-12 MED ORDER — HEPARIN SODIUM (PORCINE) 1000 UNIT/ML DIALYSIS: 1000.0000 [IU] | INTRAMUSCULAR | Status: DC | PRN

## 2023-10-12 MED ORDER — SULFAMETHOXAZOLE-TRIMETHOPRIM 400-80 MG PO TABS: 1.0000 | ORAL_TABLET | Freq: Two times a day (BID) | ORAL | 0 refills | Status: AC

## 2023-10-12 MED ORDER — EPOETIN ALFA-EPBX 4000 UNIT/ML IJ SOLN
INTRAMUSCULAR | Status: AC
  Filled 2023-10-12: qty 1

## 2023-10-12 NOTE — Progress Notes (Signed)
 Occupational Therapy Treatment Patient Details Name: Amy Wu MRN: [id number] DOB: 1950-07-24 Today's Date: 10/12/2023   History of present illness Pt is a 73 y.o. female presenting to hospital 10/01/23 with c/o increasing somnolence; pt noted to be hypoxic.  Pt admitted with sepsis, acute respiratory failure with hypoxia, PNA, encephalopathy, ESRD on dialysis.  S/p L heart cath 10/04/23; L femoral vein tunneled HD catheter placement 10/05/23.  PMH includes ESRD on HD MWF, DM, HLD, PVD.   OT comments  Upon entering the room, pt supine in bed but agreeable to co-treatment with PT. Pt reports hoyer lift transfer in home to wheelchair and then husband uses slide board to transfer her from wheelchair >car. Pt needing total A of 2 for bed mobility. Supervision to CGA for static sitting balance. Total A of 2 for slide board transfer from bed >recliner chair. Total A of 2 lateral scoot back to bed after rest. Pt repositioned in bed at end of session. Call bell and all needed items within reach upon exiting the room.       If plan is discharge home, recommend the following:  Two people to help with walking and/or transfers;A lot of help with bathing/dressing/bathroom;Assistance with cooking/housework;Assist for transportation;Direct supervision/assist for medications management;Direct supervision/assist for financial management;Supervision due to cognitive status   Equipment Recommendations  Hospital bed       Precautions / Restrictions Precautions Precautions: Fall Recall of Precautions/Restrictions: Intact Restrictions Other Position/Activity Restrictions: R AKA; L BKA       Mobility Bed Mobility   Bed Mobility: Supine to Sit, Sit to Supine     Supine to sit: Total assist, +2 for physical assistance Sit to supine: Total assist, +2 for physical assistance        Transfers Overall transfer level: Needs assistance Equipment used: 2 person hand held assist, Sliding board Transfers:  Bed to chair/wheelchair/BSC            Lateral/Scoot Transfers: With slide board, Total assist, +2 physical assistance, +2 safety/equipment       Balance Overall balance assessment: Needs assistance Sitting-balance support: Feet supported Sitting balance-Leahy Scale: Fair                                     ADL either performed or assessed with clinical judgement   ADL Overall ADL's : Needs assistance/impaired                                            Extremity/Trunk Assessment Upper Extremity Assessment Upper Extremity Assessment: Generalized weakness   Lower Extremity Assessment Lower Extremity Assessment: Generalized weakness        Vision Patient Visual Report: No change from baseline           Communication Communication Communication: Impaired Factors Affecting Communication: Difficulty expressing self   Cognition Arousal: Alert Behavior During Therapy: WFL for tasks assessed/performed Cognition: Cognition impaired                                 Following commands impaired: Follows one step commands with increased time      Cueing   Cueing Techniques: Verbal cues, Visual cues             Pertinent Vitals/  Pain       Pain Assessment Pain Assessment: No/denies pain  Home Living     Available Help at Discharge: Personal care attendant;Friend(s);Other (Comment) Type of Home: House                              Lives With: Spouse        Frequency  Min 1X/week        Progress Toward Goals  OT Goals(current goals can now be found in the care plan section)  Progress towards OT goals: Progressing toward goals         Co-evaluation    PT/OT/SLP Co-Evaluation/Treatment: Yes Reason for Co-Treatment: To address functional/ADL transfers;For patient/therapist safety;Complexity of the patient's impairments (multi-system involvement) PT goals addressed during session:  Mobility/safety with mobility OT goals addressed during session: ADL's and self-care      AM-PAC OT "6 Clicks" Daily Activity     Outcome Measure   Help from another person eating meals?: A Little Help from another person taking care of personal grooming?: A Lot Help from another person toileting, which includes using toliet, bedpan, or urinal?: Total Help from another person bathing (including washing, rinsing, drying)?: A Lot Help from another person to put on and taking off regular upper body clothing?: A Lot Help from another person to put on and taking off regular lower body clothing?: Total 6 Click Score: 11    End of Session    OT Visit Diagnosis: Other abnormalities of gait and mobility (R26.89);Muscle weakness (generalized) (M62.81)   Activity Tolerance Patient limited by lethargy   Patient Left in bed;with bed alarm set;with call bell/phone within reach;with family/visitor present   Nurse Communication Mobility status        Time: 2130-8657 OT Time Calculation (min): 26 min  Charges: OT General Charges $OT Visit: 1 Visit OT Treatments $Therapeutic Activity: 8-22 mins  George Kinder, MS, OTR/L , CBIS ascom [phone]  10/12/23, 1:04 PM

## 2023-10-12 NOTE — Evaluation (Signed)
 Speech Language Pathology Evaluation Patient Details Name: Amy Wu MRN: [id number] DOB: 30-Jun-1950 Today's Date: 10/12/2023 Time: 1050-1100 SLP Time Calculation (min) (ACUTE ONLY): 10 min  Problem List:  Patient Active Problem List   Diagnosis Date Noted   Serratia marcescens infection 10/09/2023   Bacteremia associated with intravascular line (HCC) 10/09/2023   Typical atrial flutter (HCC) 10/06/2023   Bacteremia 10/05/2023   Elevated troponin I level 10/04/2023   Sepsis (HCC) 10/01/2023   Acute respiratory failure with hypoxia (HCC) 10/01/2023   Encephalopathy 10/01/2023   Complication associated with dialysis catheter 09/24/2023   Hemorrhage due to vascular prosthetic devices, implants and grafts, initial encounter (HCC) 05/27/2021   ESRD on dialysis (HCC) 05/23/2021   Encounter for screening for COVID-19 03/11/2020   Restless leg syndrome, uncontrolled 06/20/2016   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/05/2014   Mild protein-calorie malnutrition (HCC) 09/22/2013   Hyperlipidemia, unspecified 03/30/2011   Dependence on renal dialysis (HCC) 03/30/2011   Other obesity due to excess calories 03/30/2011   Non-ST elevation (NSTEMI) myocardial infarction (HCC) 08/09/2010   Other hereditary and idiopathic neuropathies 08/09/2010   Secondary hyperparathyroidism of renal origin (HCC) 04/13/2003   Hypertensive chronic kidney disease with stage 5 chronic kidney disease or end stage renal disease (HCC) 03/08/2002   Pure hypercholesterolemia 03/08/2002   Type 2 diabetes mellitus (HCC) 03/04/2002   ESRD (end stage renal disease) (HCC) 03/04/2002   Past Medical History:  Past Medical History:  Diagnosis Date   Arthritis    Chronic kidney disease    Complication of anesthesia    Diabetes mellitus without complication (HCC)    Hyperlipidemia    Peripheral vascular disease (HCC)    Past Surgical History:  Past Surgical History:  Procedure Laterality Date   A/V  SHUNT INTERVENTION Left 11/20/2019   Procedure: A/V SHUNT INTERVENTION (Left Thigh);  Surgeon: Celso College, MD;  Location: [facility];  Service: Cardiovascular;  Laterality: Left;   A/V SHUNTOGRAM Left 02/16/2017   Procedure: A/V Shuntogram;  Surgeon: Celso College, MD;  Location: [facility];  Service: Cardiovascular;  Laterality: Left;   A/V SHUNTOGRAM Left 12/13/2017   Procedure: A/V SHUNTOGRAM;  Surgeon: Celso College, MD;  Location: [facility];  Service: Cardiovascular;  Laterality: Left;   A/V SHUNTOGRAM Left 12/05/2018   Procedure: A/V SHUNTOGRAM;  Surgeon: Celso College, MD;  Location: [facility];  Service: Cardiovascular;  Laterality: Left;   A/V SHUNTOGRAM Left 06/03/2020   Procedure: A/V SHUNTOGRAM;  Surgeon: Celso College, MD;  Location: [facility];  Service: Cardiovascular;  Laterality: Left;   A/V SHUNTOGRAM Left 05/24/2021   Procedure: A/V SHUNTOGRAM;  Surgeon: Jackquelyn Mass, MD;  Location: [facility];  Service: Cardiovascular;  Laterality: Left;  Thigh Graft   ABOVE KNEE LEG AMPUTATION     AV FISTULA PLACEMENT Bilateral    AV FISTULA PLACEMENT Left 07/01/2015   Procedure: INSERTION OF ARTERIOVENOUS (AV) GRAFT THIGH (ARTEGRAFT);  Surgeon: Celso College, MD;  Location: [facility];  Service: Vascular;  Laterality: Left;   BELOW KNEE LEG AMPUTATION Left    BREAST BIOPSY     CATARACT EXTRACTION W/PHACO Left 07/10/2022   Procedure: CATARACT EXTRACTION PHACO AND INTRAOCULAR LENS PLACEMENT (IOC) LEFT DIABETIC  12.98  01:13.7;  Surgeon: Rosa College, MD;  Location: [facility];  Service: Ophthalmology;  Laterality: Left;   DIALYSIS/PERMA CATHETER INSERTION N/A 05/26/2021   Procedure: DIALYSIS/PERMA CATHETER INSERTION;  Surgeon: Celso College, MD;  Location: [facility];  Service: Cardiovascular;  Laterality: N/A;   DIALYSIS/PERMA CATHETER INSERTION N/A 07/05/2022   Procedure: DIALYSIS/PERMA CATHETER INSERTION;   Surgeon: Celso College, MD;  Location: [facility];  Service: Cardiovascular;  Laterality: N/A;   DIALYSIS/PERMA CATHETER INSERTION N/A 07/28/2022   Procedure: DIALYSIS/PERMA CATHETER INSERTION;  Surgeon: Celso College, MD;  Location: [facility];  Service: Cardiovascular;  Laterality: N/A;   DIALYSIS/PERMA CATHETER INSERTION N/A 09/21/2022   Procedure: DIALYSIS/PERMA CATHETER INSERTION;  Surgeon: Celso College, MD;  Location: [facility];  Service: Cardiovascular;  Laterality: N/A;   DIALYSIS/PERMA CATHETER INSERTION N/A 02/15/2023   Procedure: DIALYSIS/PERMA CATHETER INSERTION;  Surgeon: Celso College, MD;  Location: [facility];  Service: Cardiovascular;  Laterality: N/A;   DIALYSIS/PERMA CATHETER INSERTION N/A 04/09/2023   Procedure: DIALYSIS/PERMA CATHETER INSERTION;  Surgeon: Jackquelyn Mass, MD;  Location: [facility];  Service: Cardiovascular;  Laterality: N/A;   DIALYSIS/PERMA CATHETER INSERTION N/A 10/05/2023   Procedure: DIALYSIS/PERMA CATHETER INSERTION;  Surgeon: Celso College, MD;  Location: [facility];  Service: Cardiovascular;  Laterality: N/A;   DIALYSIS/PERMA CATHETER REPAIR Right 09/24/2023   Procedure: DIALYSIS/PERMA CATHETER REPAIR;  Surgeon: Jackquelyn Mass, MD;  Location: [facility];  Service: Cardiovascular;  Laterality: Right;   ENDARTERECTOMY FEMORAL Left 07/01/2015   Procedure: SUPERFICIAL FEMORAL ENDARTERECTOMY ;  Surgeon: Celso College, MD;  Location: [facility];  Service: Vascular;  Laterality: Left;   EXCHANGE OF A DIALYSIS CATHETER  02/11/2015   Procedure: Exchange Of A Dialysis Catheter;  Surgeon: Celso College, MD;  Location: [facility];  Service: Cardiovascular;;   LEFT HEART CATH AND CORONARY ANGIOGRAPHY N/A 10/04/2023   Procedure: LEFT HEART CATH AND CORONARY ANGIOGRAPHY;  Surgeon: Arleen Lacer, MD;  Location: [facility];  Service: Cardiovascular;  Laterality: N/A;   LEG AMPUTATION ABOVE  KNEE Right    PERIPHERAL VASCULAR CATHETERIZATION N/A 02/11/2015   Procedure: Dialysis/Perma Catheter Insertion;  Surgeon: Celso College, MD;  Location: [facility];  Service: Cardiovascular;  Laterality: N/A;   PERIPHERAL VASCULAR CATHETERIZATION N/A 05/11/2015   Procedure: Dialysis/Perma Catheter Insertion, exchange;  Surgeon: Jackquelyn Mass, MD;  Location: [facility];  Service: Cardiovascular;  Laterality: N/A;   PERIPHERAL VASCULAR CATHETERIZATION N/A 05/18/2015   Procedure: Dialysis/Perma Catheter Insertion;  Surgeon: Jackquelyn Mass, MD;  Location: [facility];  Service: Cardiovascular;  Laterality: N/A;   PERIPHERAL VASCULAR CATHETERIZATION Left 07/19/2015   Procedure: A/V Shuntogram/Fistulagram;  Surgeon: Celso College, MD;  Location: [facility];  Service: Cardiovascular;  Laterality: Left;   PERIPHERAL VASCULAR CATHETERIZATION N/A 07/19/2015   Procedure: A/V Shunt Intervention;  Surgeon: Celso College, MD;  Location: [facility];  Service: Cardiovascular;  Laterality: N/A;   PERIPHERAL VASCULAR CATHETERIZATION N/A 08/26/2015   Procedure: Dialysis/Perma Catheter Removal;  Surgeon: Celso College, MD;  Location: [facility];  Service: Cardiovascular;  Laterality: N/A;   PERIPHERAL VASCULAR THROMBECTOMY N/A 12/29/2016   Procedure: Peripheral Vascular Thrombectomy;  Surgeon: Jackquelyn Mass, MD;  Location: [facility];  Service: Cardiovascular;  Laterality: N/A;   HPI:  Amy Wu is a 73 y.o. female with medical history significant of end-stage renal disease on hemodialysis MWF, type 2 diabetes, hyperlipidemia, peripheral vascular disease presenting to ED 10/01/23 from home with generalized malaise and fevers,  confusion per family, mild cough/SOB. No abdominal pain but some diarrhea. Missed hemodialysis 04/13 secondary to symptoms. Chart reveals that around 10/08/2023 pt was intermittently disoriented, negative stroke workup and  change of antiobiotics d/t possible contribution to encephalopathy.   Assessment / Plan / Recommendation Clinical Impression  ST services consulted on 10/12/2023 d/t dysfluent speech. When ST entered room for evaluation, pt's son Laurell Pond) was on speakerphone with pt. Pt attempting to take medications and handed this writer her phone to speak with this son. Son provided the following information: pt is fluent at baseline, developed "stuttering during the first few days in the hospital due to a reaction to the antibiotic. That was changed and her stuttering stoped. She was just fine yesterday and this more she was just fine until she started a steriod this morning and now around 10 to 11 she is stuttering again." He also reports that she "stuttered at [facility] after having her second leg amputated." He had questions about the above medication change that this writer could not answer. However, I informed pt and her son that I would convey these questions to pt's attending.       Pt presents with dysfluencies that are characterized as hesitations, blocks, mid word elongations. While dysfluencies were present when communicating with this Clinical research associate, her speech was fluent with little to no dysfluencies observed when participating in transfers with PT/OT and providing information to them (> 10 minutes without dysfluencies). Given this and negative work-up, don't suspect that her dysfluencies are organic in nature. Hopeful that fluency will continue and increase once pt is at home in her familiar environment. Should dysfluencies continue, would recommend consult to neurology and HHST. Prognosis is good    SLP Assessment  SLP Recommendation/Assessment: All further Speech Lanaguage Pathology  needs can be addressed in the next venue of care SLP Visit Diagnosis: Dysarthria and anarthria (R47.1)    Recommendations for follow up therapy are one component of a multi-disciplinary discharge planning process, led by the  attending physician.  Recommendations may be updated based on patient status, additional functional criteria and insurance authorization.    Follow Up Recommendations  Home health SLP    Assistance Recommended at Discharge  Intermittent Supervision/Assistance  Functional Status Assessment Patient has had a recent decline in their functional status and/or demonstrates limited ability to make significant improvements in function in a reasonable and predictable amount of time  Frequency and Duration           SLP Evaluation Cognition  Overall Cognitive Status: No family/caregiver present to determine baseline cognitive functioning (per chart, her son reports that her cognition is at baseline)       Comprehension    WFL   Expression Verbal Expression Overall Verbal Expression: Appears within functional limits for tasks assessed   Oral / Motor  Oral Motor/Sensory Function Overall Oral Motor/Sensory Function: Within functional limits Motor Speech Overall Motor Speech: Other (comment) (See clinical impression statement)           Ringo Sherod B. Garlin Junker, M.S., CCC-SLP, Tree surgeon Certified Brain Injury Specialist [facility]  [facility] Office [phone] Ascom [phone] Fax [phone]

## 2023-10-12 NOTE — Discharge Instructions (Signed)
 Please complete your bactrim  antibiotic treatment.  Follow up with your PCP in 1 week to monitor your recovery

## 2023-10-12 NOTE — Progress Notes (Signed)
 Physical Therapy Treatment Patient Details Name: Amy Wu MRN: [id number] DOB: [birth year] Today's Date: 10/12/2023   History of Present Illness Pt is a 74 y.o. female presenting to hospital 10/01/23 with c/o increasing somnolence; pt noted to be hypoxic.  Pt admitted with sepsis, acute respiratory failure with hypoxia, PNA, encephalopathy, ESRD on dialysis.  S/p L heart cath 10/04/23; L femoral vein tunneled HD catheter placement 10/05/23.  PMH includes ESRD on HD MWF, DM, HLD, PVD.    PT Comments  Pt received upright in bed. Per EMR and secure message pt with acute dysphagia changes. Pt is totalA+2 for bed mobility and slide board transfer from bed <> recliner and recliner <> bed. Pt with fair static sitting EOB with back unsupported using UE support on bed features and recliner arm rests. Anticipate this is pt's baseline with slide board transfers. Recommend 2 person assist for slide board transfer for safety. Pt positioned in bed with all needs in reach. D/c recs appropriate.     If plan is discharge home, recommend the following: Two people to help with walking and/or transfers;A lot of help with bathing/dressing/bathroom;Assistance with cooking/housework;Assist for transportation;Help with stairs or ramp for entrance   Can travel by private vehicle        Equipment Recommendations  Hospital bed    Recommendations for Other Services       Precautions / Restrictions Precautions Precautions: Fall Recall of Precautions/Restrictions: Intact Restrictions Weight Bearing Restrictions Per Provider Order: Yes Other Position/Activity Restrictions: R/L AKA     Mobility  Bed Mobility Overal bed mobility: Needs Assistance Bed Mobility: Supine to Sit, Sit to Supine     Supine to sit: Total assist, +2 for physical assistance Sit to supine: Total assist, +2 for physical assistance     Patient Response: Cooperative  Transfers Overall transfer level: Needs assistance Equipment used:  2 person hand held assist, Sliding board Transfers: Bed to chair/wheelchair/BSC            Lateral/Scoot Transfers: With slide board, Total assist, +2 physical assistance, +2 safety/equipment      Ambulation/Gait               General Gait Details: Pt non-ambulatory   Stairs             Wheelchair Mobility     Tilt Bed Tilt Bed Patient Response: Cooperative  Modified Rankin (Stroke Patients Only)       Balance Overall balance assessment: Needs assistance Sitting-balance support: Feet supported Sitting balance-Leahy Scale: Fair                                      Hotel manager: Impaired Factors Affecting Communication: Difficulty expressing self  Cognition Arousal: Alert Behavior During Therapy: WFL for tasks assessed/performed                                    Cueing Cueing Techniques: Verbal cues, Visual cues  Exercises      General Comments        Pertinent Vitals/Pain Pain Assessment Pain Assessment: No/denies pain    Home Living     Available Help at Discharge: Personal care attendant;Friend(s);Other (Comment) Type of Home: House                  Prior Function  PT Goals (current goals can now be found in the care plan section) Acute Rehab PT Goals Patient Stated Goal: to improve strength PT Goal Formulation: With patient Time For Goal Achievement: 10/21/23 Potential to Achieve Goals: Fair Progress towards PT goals: Progressing toward goals    Frequency    Min 1X/week      PT Plan      Co-evaluation PT/OT/SLP Co-Evaluation/Treatment: Yes Reason for Co-Treatment: To address functional/ADL transfers;For patient/therapist safety;Complexity of the patient's impairments (multi-system involvement) PT goals addressed during session: Mobility/safety with mobility OT goals addressed during session: ADL's and self-care      AM-PAC PT "6  Clicks" Mobility   Outcome Measure  Help needed turning from your back to your side while in a flat bed without using bedrails?: A Lot Help needed moving from lying on your back to sitting on the side of a flat bed without using bedrails?: A Lot Help needed moving to and from a bed to a chair (including a wheelchair)?: Total Help needed standing up from a chair using your arms (e.g., wheelchair or bedside chair)?: Total Help needed to walk in hospital room?: Total Help needed climbing 3-5 steps with a railing? : Total 6 Click Score: 8    End of Session   Activity Tolerance: Patient tolerated treatment well Patient left: in bed;with call bell/phone within reach;with bed alarm set;with family/visitor present Nurse Communication: Mobility status PT Visit Diagnosis: Other abnormalities of gait and mobility (R26.89);Muscle weakness (generalized) (M62.81)     Time: 1610-9604 PT Time Calculation (min) (ACUTE ONLY): 24 min  Charges:    $Therapeutic Activity: 8-22 mins PT General Charges $$ ACUTE PT VISIT: 1 Visit                     Marc Senior. Fairly IV, PT, DPT Physical Therapist- [place]  [facility]  10/12/2023, 1:09 PM

## 2023-10-12 NOTE — Progress Notes (Signed)
 Patient being D/C home this evening. Son Sim Dryer called at 8:00 PM at phone number [phone] agreed to have mother picked up by [facility] and transported home to [address]. [place], [id number].  Patient son Sim Dryer states home oxygen has been delivered and he will be at patients home when patient is being transported. Devon  called at 10:55 PM to inform him that patient is being picked up now and will be leaving [facility] with in 10 minuets. Son waiting at patients home.

## 2023-10-12 NOTE — TOC Progression Note (Addendum)
 Transition of Care [facility]) - Progression Note    Patient Details  Name: Amy Wu MRN: [id number] Date of Birth: 09/15/1950  [facility]) CM/SW Contact  Odilia Bennett, LCSW Phone Number: 10/12/2023, 10:13 AM  Clinical Narrative:   [facility] RN is aware of plan to discharge today after HD and that she will discharge on PO abx. Patient will go to HD around noon so she will need ambulance transport home. [facility] RN confirmed oxygen has been delivered to the home. Son declined hospital bed.   4:34 pm: Patient still has another 2 hours in HD. Per MD, will hold off on discharge for today. [facility] RN is aware.  Expected Discharge Plan and Services                                               Social Determinants of Health (SDOH) Interventions SDOH Screenings   Food Insecurity: No Food Insecurity (10/01/2023)  Housing: Low Risk  (10/01/2023)  Transportation Needs: No Transportation Needs (10/01/2023)  Utilities: Not At Risk (10/01/2023)  Social Connections: Moderately Isolated (10/01/2023)  Tobacco Use: Low Risk  (10/01/2023)    Readmission Risk Interventions    05/26/2021    1:38 PM 05/24/2021    3:35 PM  Readmission Risk Prevention Plan  Transportation Screening Complete Complete  PCP or Specialist Appt within 3-5 Days Complete Complete  HRI or Home Care Consult  Complete  Social Work Consult for Recovery Care Planning/Counseling Complete   Palliative Care Screening Not Applicable Not Applicable  Medication Review Oceanographer) Complete Complete

## 2023-10-12 NOTE — Plan of Care (Signed)
  Problem: Education: Goal: Knowledge of General Education information will improve Description: Including pain rating scale, medication(s)/side effects and non-pharmacologic comfort measures Outcome: Adequate for Discharge   Problem: Health Behavior/Discharge Planning: Goal: Ability to manage health-related needs will improve Outcome: Adequate for Discharge   Problem: Clinical Measurements: Goal: Ability to maintain clinical measurements within normal limits will improve Outcome: Adequate for Discharge   Problem: Clinical Measurements: Goal: Will remain free from infection Outcome: Adequate for Discharge   Problem: Clinical Measurements: Goal: Diagnostic test results will improve Outcome: Adequate for Discharge   Problem: Clinical Measurements: Goal: Respiratory complications will improve Outcome: Adequate for Discharge   Problem: Clinical Measurements: Goal: Cardiovascular complication will be avoided Outcome: Adequate for Discharge   Problem: Activity: Goal: Risk for activity intolerance will decrease Outcome: Adequate for Discharge   Problem: Nutrition: Goal: Adequate nutrition will be maintained Outcome: Adequate for Discharge   Problem: Coping: Goal: Level of anxiety will decrease Outcome: Adequate for Discharge   Problem: Elimination: Goal: Will not experience complications related to bowel motility Outcome: Adequate for Discharge   Problem: Elimination: Goal: Will not experience complications related to urinary retention Outcome: Adequate for Discharge

## 2023-10-12 NOTE — Discharge Summary (Signed)
 Physician Discharge Summary  Patient: Amy Wu ZOX:[id number] DOB: [birth year]   Code Status: Limited: Do not attempt resuscitation (DNR) -DNR-LIMITED -Do Not Intubate/DNI  Admit date: 10/01/2023 Discharge date: 10/12/2023 Disposition: Home health, PT, OT, SLP, nurse aid, and RN PCP: Amy Wu, Amy April, DO  Recommendations for Outpatient Follow-up:  Follow up with PCP within 1-2 weeks Regarding general hospital follow up and preventative care Consider neurology consult if not improved speech Follow up with nephrology for routine HD Follow up with ID for infection management  Discharge Diagnoses:  Principal Problem:   Sepsis (HCC) Active Problems:   Acute respiratory failure with hypoxia (HCC)   ESRD on dialysis (HCC)   Encephalopathy   Type 2 diabetes mellitus (HCC)   Hyperlipidemia, unspecified   ESRD (end stage renal disease) (HCC)   Non-ST elevation (NSTEMI) myocardial infarction (HCC)   Elevated troponin I level   Bacteremia   Typical atrial flutter (HCC)   Serratia marcescens infection   Bacteremia associated with intravascular line [facility])  Brief Hospital Course Summary: Amy Wu is a 73 y.o. female with medical history significant of end-stage renal disease on hemodialysis MWF, type 2 diabetes, hyperlipidemia, peripheral vascular disease presenting to ED 10/01/23 from home with generalized malaise and fevers, confusion per family, mild cough/SOB. No abdominal pain but some diarrhea. Missed hemodialysis 04/13 secondary to symptoms.    04/14: admitted to hospitalist service for sepsis as well as acute resp fail w/ hypoxia and concern for volume overload. Requiring BiPAP  04/15: (+)bacteremia w/ serratia, abx to cefepime . Pt transferred to the stepdown unit for pressors. ID consult, HD catheter removed due to likely source of sepsis. Elevated troponins, started heparin   04/16: Off levo later today and onto scheduled midodrine . Echo ordered. Cardiology consulted, continue  heparin  infusion, not on beta-blocker secondary to bradycardia, scheduled for cardiac cath on 04/17. Transthoracic echo ok. Repeating BCx today. Plan reinsertion HD cath 04/18.  04/17: LHC today. Significant CAD, no PCI 04/18: L femoral dialysis cath placed, planning dialysis tomorrow. Glc substantial increase, adjusting sliding scale  04/19: New onset atrial flutter, cardiology following, started heparin  and amiodarone . Started eliquis .  Presently- concerns for several days that patient is intermittently disoriented. Has had negative stroke workup and change of antibiotics due to possible contribution to encephalopathy. Other reversible causes reasonably ruled out. At mental baseline according to her son. Had difficulty with HD cath but eventually did get started with HD. Offered to keep her another night due to late dc but son and patient strongly decline and will go home today no matter what.  Evaluated by SLP who does not see pathological speech abnormality.   All other chronic conditions were treated with home medications.    Discharge Condition: Stable, improved Recommended discharge diet: Renal diet  Consultations: Nephrology  ID Vascular surgery  Cardiology    Procedures/Studies: HD  Levophed , bipap transiently  LHC HD cath placement L femoral site  Discharge Instructions     Home infusion instructions   Complete by: As directed    Pt will receive antibiotics at dialysis sessions   Instructions: Flushing of vascular access device: 0.9% NaCl pre/post medication administration and prn patency; Heparin  100 u/ml, 5ml for implanted ports and Heparin  10u/ml, 5ml for all other central venous catheters.      Allergies as of 10/12/2023       Reactions   Cefepime  Other (See Comments)   Encephalopathy   Ancef  [cefazolin ] Palpitations   Contrast Media [iodinated Contrast Media] Palpitations  Medication List     STOP taking these medications    aspirin  EC 81 MG  tablet   ketotifen  0.025 % ophthalmic solution Commonly known as: ZADITOR    ReliOn Lancet Devices 30G Misc       TAKE these medications    acetaminophen  500 MG tablet Commonly known as: TYLENOL  Take 1,000 mg by mouth every 8 (eight) hours as needed for moderate pain.   amiodarone  200 MG tablet Commonly known as: PACERONE  Take 1 tablet (200 mg total) by mouth 2 (two) times daily for 12 days, THEN 1 tablet (200 mg total) daily. Start taking on: Wu 21, 2025   apixaban  5 MG Tabs tablet Commonly known as: ELIQUIS  Take 1 tablet (5 mg total) by mouth 2 (two) times daily.   atorvastatin  40 MG tablet Commonly known as: LIPITOR Take 40 mg by mouth daily.   calcitRIOL  0.25 MCG capsule Commonly known as: ROCALTROL  Take 0.25 mcg by mouth daily. Take 3 capsules by mouth three times week at dialysis   cinacalcet  30 MG tablet Commonly known as: SENSIPAR  Take 30 mg by mouth daily with supper.   Comfort EZ Pen Needles 31G X 6 MM Misc Generic drug: Insulin  Pen Needle Inject into the skin.   ferrous sulfate  325 (65 FE) MG tablet Take 325 mg by mouth every other day.   fluticasone  50 MCG/ACT nasal spray Commonly known as: FLONASE  Place 1 spray into both nostrils daily.   gabapentin  100 MG capsule Commonly known as: NEURONTIN  Take 100 mg by mouth 3 (three) times daily as needed. M/W/F after dialysis PRN for leg/thigh cramps   glucose 4 GM chewable tablet Chew 1 tablet by mouth as needed for low blood sugar. If blood sugar less than 70. Chew 3 tablets and recheck blood sugar in 15 minutes   glucose blood test strip 1 each by Other route as needed for other. Use as instructed   Januvia 25 MG tablet Generic drug: sitaGLIPtin Take 25 mg by mouth daily.   lanthanum  1000 MG chewable tablet Commonly known as: FOSRENOL  Chew 1,000 mg by mouth 2 (two) times daily with a meal.   Lokelma 10 g Pack packet Generic drug: sodium zirconium cyclosilicate Take 1 packet by mouth as  directed. Taken on Sat and Sundays midday   loratadine  10 MG tablet Commonly known as: CLARITIN  Take 10 mg by mouth daily as needed for allergies.   PreserVision AREDS 2 Caps Take 1 capsule by mouth 2 (two) times daily.   senna-docusate 8.6-50 MG tablet Commonly known as: Senokot-S Take 1-2 tablets by mouth at bedtime as needed for mild constipation or moderate constipation. 1-2 tablets at bedtime if needed   Stool Softener 100 MG capsule Generic drug: docusate sodium  Take 100 mg by mouth 2 (two) times daily.   sulfamethoxazole -trimethoprim  400-80 MG tablet Commonly known as: BACTRIM  Take 1 tablet by mouth every 12 (twelve) hours for 5 days.   tiZANidine  4 MG tablet Commonly known as: ZANAFLEX  Take 4 mg by mouth 2 (two) times daily as needed for muscle spasms.   TRUEplus Lancets 28G Misc Apply topically.   UltiCare Insulin  Syringe 31G X 5/16" 0.3 ML Misc Generic drug: Insulin  Syringe-Needle U-100 SMARTSIG:Injection Daily               Home Infusion Instuctions  (From admission, onward)           Start     Ordered   10/08/23 0000  Home infusion instructions  Comments: Pt will receive antibiotics at dialysis sessions  Question:  Instructions  Answer:  Flushing of vascular access device: 0.9% NaCl pre/post medication administration and prn patency; Heparin  100 u/ml, 5ml for implanted ports and Heparin  10u/ml, 5ml for all other central venous catheters.   10/08/23 1416           Subjective   Pt reports feeling well. Denies pain. Wants desperately to go home. Wants to see speech therapy due to her worsening word finding ability.   All questions and concerns were addressed at time of discharge.  Objective  Blood pressure (!) 121/92, pulse 73, temperature 98.6 F (37 C), temperature source Oral, resp. rate (!) 21, height 4\' 9"  (1.448 m), weight 100.8 kg, SpO2 98%.   General: Pt is alert, awake, not in acute distress Cardiovascular: RRR, S1/S2 +, no  rubs, no gallops Respiratory: CTA bilaterally, no wheezing, no rhonchi Abdominal: Soft, NT, ND, bowel sounds + Extremities: no edema, no cyanosis  The results of significant diagnostics from this hospitalization (including imaging, microbiology, ancillary and laboratory) are listed below for reference.   Imaging studies: MR BRAIN W WO CONTRAST Result Date: 10/11/2023 CLINICAL DATA:  Provided history: Mental status change, persistent or worsening. EXAM: MRI HEAD WITHOUT AND WITH CONTRAST TECHNIQUE: Multiplanar, multiecho pulse sequences of the brain and surrounding structures were obtained without and with intravenous contrast. CONTRAST:  10mL GADAVIST  GADOBUTROL  1 MMOL/ML IV SOLN COMPARISON:  Head CT 10/09/2023.  Brain MRI 05/24/2021. FINDINGS: FINDINGS : Brain: Mild generalized cerebral atrophy. 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 Multifocal T2 FLAIR hyperintense signal abnormality within the cerebral white matter, nonspecific but compatible with mild chronic small vessel ischemic disease. 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 Chronic microhemorrhages scattered within the brainstem and supratentorial brain. There is no acute infarct. No evidence of an intracranial mass. No extra-axial fluid collection. No midline shift. Mild but diffuse smooth pachymeningeal dural thickening and enhancement along the bilateral cerebral convexities. No pathologic parenchymal enhancement. Vascular: Maintained flow voids within the proximal large arterial vessels. Skull and upper cervical spine: Abnormal T1 hypointense marrow signal within the calvarium and within visible portions of the cervical spine. Sinuses/Orbits: No mass or acute finding within the imaged orbits. Prior ocular lens replacement. Right scleral buckle. No significant paranasal sinus disease. IMPRESSION: 1. No evidence of an acute or recent  subacute infarction. 2. Mild but diffuse smooth pachymeningeal dural thickening and enhancement along the bilateral cerebral convexities, nonspecific. Consider lumbar puncture and CSF analysis if there is concern for infectious meningitis. 3. 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 4. Mild chronic small ischemic changes within the cerebral white matter. 5. Chronic microhemorrhages scattered within the brainstem and supratentorial brain. Findings likely at least partly reflect sequelae of chronic hypertensive microangiopathy. However, superimposed manifestations of cerebral amyloid angiopathy cannot be excluded. 6. Mild generalized cerebral atrophy. 7. Abnormal T1 hypointense marrow signal within the calvarium and within visible portions of the cervical spine. While this finding can reflect a marrow infiltrative process, the most common causes include chronic anemia, smoking and obesity. Electronically Signed   By: Bascom Lily D.O.   On: 10/11/2023 15:58   CT HEAD WO CONTRAST ( ) Result Date: 10/09/2023 CLINICAL DATA:  73 year old female with altered mental status. EXAM: CT HEAD WITHOUT CONTRAST TECHNIQUE: Contiguous axial images were obtained from the base of the  skull through the vertex without intravenous contrast. RADIATION DOSE REDUCTION: This exam was performed according to the departmental dose-optimization program which includes automated exposure control, adjustment of the mA and/or kV according to patient size and/or use of iterative reconstruction technique. COMPARISON:  Brain MRI 05/24/2021.  Head CT 05/23/2021. FINDINGS: Brain: No midline shift, ventriculomegaly, mass effect, evidence of mass lesion, intracranial hemorrhage or evidence of cortically based acute infarction. Basal ganglia vascular calcifications. Small chronic bilateral cerebellar infarcts are stable from the 2022 MRI. Patchy  additional bilateral cerebral white matter hypodensity. Vascular: Severe Calcified atherosclerosis at the skull base. No suspicious intracranial vascular hyperdensity. Skull: Intact.  No acute osseous abnormality identified. Sinuses/Orbits: Visualized paranasal sinuses and mastoids are stable and well aerated. Other: Calcified scalp vessel atherosclerosis. Postoperative changes to both globes. No acute orbit or scalp soft tissue finding. IMPRESSION: 1. No acute intracranial abnormality. 2. Small chronic cerebellar infarcts, chronic cerebral white matter changes, and Severe calcified atherosclerosis. Electronically Signed   By: Marlise Simpers M.D.   On: 10/09/2023 16:45   PERIPHERAL VASCULAR CATHETERIZATION Result Date: 10/05/2023 See surgical note for result.  CARDIAC CATHETERIZATION Result Date: 10/04/2023 Images from the original result were not included.   Prox RCA to Mid RCA lesion is 90% stenosed.  Mid RCA lesion is 80% stenosed.Mid RCA lesion is 80% stenosed.   Non-stenotic Ost LM to Dist LM lesion.   Heavily calcified left coronary artery system was not able to be engaged due to inability to advance catheter to the left coronary cusp.   Not able to cross the aortic valve for measurement of the hemodynamics. Dominance: Right Heavily calcified somewhat tortuous RCA was difficult to engage: Prox RCA to Mid RCA lesion is 90% stenosed. Mid RCA lesion is 80% stenosed.Mid RCA lesion is 80% stenosed. Heavily calcified left coronary artery system was not able to be engaged due to inability to advance catheter to the left coronary cusp. Unable to engage the left coronary artery as the catheters were not advancing beyond the innominate artery and aortic knob. There is extreme tortuosity of the innominate artery and heavy calcifications not allowing the catheter be pushed freely through the artery and into the forearm or brachial aspect of the radial artery and worse around the tortuous innominate. RECOMMENDATIONS    Anticipated discharge date to be determined.   Recommend Aspirin  81mg  daily for moderate CAD.   Based on the presence of severe coronary disease at least in the one-vessel seen and heavy calcification in the left coronary system would strongly consider antiplatelet therapy with at a minimum aspirin  but even potentially Plavix based on elevated troponin levels.  No obvious invasive options for CAD management given lack of viable access. Randene Bustard, MD   ECHOCARDIOGRAM COMPLETE Result Date: 10/02/2023    ECHOCARDIOGRAM REPORT   Patient Name:   Kimbly G Math Date of Exam: 10/02/2023 Medical Rec #:  [id number]     Height:       57.0 in Accession #:    [id number]    Weight:       220.2 lb Date of Birth:  [birth year]    BSA:          1.874 m Patient Age:    72 years      BP:           154/64 mmHg Patient Gender: F             HR:           89 bpm.  Exam Location:  [facility] Procedure: 2D Echo, Cardiac Doppler, Color Doppler and Intracardiac            Opacification Agent (Both Spectral and Color Flow Doppler were            utilized during procedure). Indications:     Cardiomegaly  History:         Patient has no prior history of Echocardiogram examinations.                  Cardiomegaly; Risk Factors:Hypertension, Diabetes and                  Dyslipidemia. ESRD on Dialysis.  Sonographer:     Clarke Crouch Referring Phys:  [phone] Alayne Allis NEWTON Diagnosing Phys: Sammy Crisp MD  Sonographer Comments: Technically difficult study due to poor echo windows, suboptimal apical window and patient is obese. Image acquisition challenging due to respiratory motion. IMPRESSIONS  1. Left ventricular ejection fraction, by estimation, is >55%. The left ventricle has normal function. Left ventricular endocardial border not optimally defined to evaluate regional wall motion. There is mild left ventricular hypertrophy. Left ventricular diastolic parameters are indeterminate.  2. Right ventricular systolic function is low normal. The  right ventricular size is normal.  3. The mitral valve is normal in structure. No evidence of mitral valve regurgitation. No evidence of mitral stenosis.  4. The aortic valve is tricuspid. There is moderate thickening of the aortic valve. Aortic valve regurgitation is not visualized. Aortic valve sclerosis is present, with no evidence of aortic valve stenosis. FINDINGS  Left Ventricle: Left ventricular ejection fraction, by estimation, is >55%. The left ventricle has normal function. Left ventricular endocardial border not optimally defined to evaluate regional wall motion. Definity  contrast agent was given IV to delineate the left ventricular endocardial borders. The left ventricular internal cavity size was normal in size. There is mild left ventricular hypertrophy. Left ventricular diastolic parameters are indeterminate. Right Ventricle: The right ventricular size is normal. Right vetricular wall thickness was not well visualized. Right ventricular systolic function is low normal. Left Atrium: Left atrial size was normal in size. Right Atrium: Right atrial size was not well visualized. Pericardium: The pericardium was not well visualized. Mitral Valve: The mitral valve is normal in structure. Mild mitral annular calcification. No evidence of mitral valve regurgitation. No evidence of mitral valve stenosis. MV peak gradient, 6.8 mmHg. The mean mitral valve gradient is 3.0 mmHg. Tricuspid Valve: The tricuspid valve is normal in structure. Tricuspid valve regurgitation is mild. Aortic Valve: The aortic valve is tricuspid. There is moderate thickening of the aortic valve. Aortic valve regurgitation is not visualized. Aortic valve sclerosis is present, with no evidence of aortic valve stenosis. Aortic valve mean gradient measures  5.0 mmHg. Aortic valve peak gradient measures 11.0 mmHg. Aortic valve area, by VTI measures 2.65 cm. Pulmonic Valve: The pulmonic valve was grossly normal. Pulmonic valve regurgitation is  trivial. No evidence of pulmonic stenosis. Aorta: The aortic root is normal in size and structure. Pulmonary Artery: The pulmonary artery is of normal size. Venous: The inferior vena cava was not well visualized. IAS/Shunts: The interatrial septum was not well visualized.  LEFT VENTRICLE PLAX 2D LVIDd:         3.80 cm   Diastology LVIDs:         2.50 cm   LV e' medial:    8.60 cm/s LV PW:         1.15 cm   LV  E/e' medial:  12.3 LV IVS:        1.06 cm   LV e' lateral:   7.22 cm/s LVOT diam:     2.10 cm   LV E/e' lateral: 14.7 LV SV:         79 LV SV Index:   42 LVOT Area:     3.46 cm  LEFT ATRIUM           Index LA diam:      3.60 cm 1.92 cm/m LA Vol (A4C): 60.2 ml 32.13 ml/m  AORTIC VALVE                     PULMONIC VALVE AV Area (Vmax):    2.94 cm      PV Vmax:       0.94 m/s AV Area (Vmean):   2.62 cm      PV Peak grad:  3.5 mmHg AV Area (VTI):     2.65 cm AV Vmax:           166.00 cm/s AV Vmean:          107.000 cm/s AV VTI:            0.296 m AV Peak Grad:      11.0 mmHg AV Mean Grad:      5.0 mmHg LVOT Vmax:         141.00 cm/s LVOT Vmean:        81.050 cm/s LVOT VTI:          0.227 m LVOT/AV VTI ratio: 0.77  AORTA Ao Root diam: 3.30 cm MITRAL VALVE                TRICUSPID VALVE MV Area (PHT): 2.71 cm     TR Peak grad:   34.3 mmHg MV Area VTI:   2.34 cm     TR Vmax:        293.00 cm/s MV Peak grad:  6.8 mmHg MV Mean grad:  3.0 mmHg     SHUNTS MV Vmax:       1.30 m/s     Systemic VTI:  0.23 m MV Vmean:      82.3 cm/s    Systemic Diam: 2.10 cm MV Decel Time: 280 msec MV E velocity: 106.00 cm/s MV A velocity: 114.00 cm/s MV E/A ratio:  0.93 Veryl Gottron End MD Electronically signed by Sammy Crisp MD Signature Date/Time: 10/02/2023/1:03:27 PM    Final    DG Chest Port 1 View Result Date: 10/01/2023 CLINICAL DATA:  Questionable sepsis - evaluate for abnormality Altered mental status.  Fever.  Hypoxemia. EXAM: PORTABLE CHEST 1 VIEW COMPARISON:  Radiographs 05/23/2021 and 07/20/2020. FINDINGS: 0932  hours. Lower lung volumes with probable vascular congestion and mildly increased left basilar atelectasis. No overt pulmonary edema or confluent airspace disease. There may be a small left pleural effusion. The heart size and mediastinal contours appears stable with aortic atherosclerosis. No acute osseous findings are seen. Telemetry leads overlie the chest. IMPRESSION: Lower lung volumes with probable vascular congestion and mildly increased left basilar atelectasis. No definite evidence of pneumonia. Electronically Signed   By: Elmon Hagedorn M.D.   On: 10/01/2023 10:12   PERIPHERAL VASCULAR CATHETERIZATION Result Date: 09/24/2023 See surgical note for result.  MM 3D SCREENING MAMMOGRAM BILATERAL BREAST Result Date: 09/24/2023 CLINICAL DATA:  Screening. EXAM: DIGITAL SCREENING BILATERAL MAMMOGRAM WITH TOMOSYNTHESIS AND CAD TECHNIQUE: Bilateral screening digital craniocaudal and mediolateral oblique mammograms were obtained. Bilateral screening  digital breast tomosynthesis was performed. The images were evaluated with computer-aided detection. COMPARISON:  Previous exam(s). ACR Breast Density Category b: There are scattered areas of fibroglandular density. FINDINGS: There are no findings suspicious for malignancy. IMPRESSION: No mammographic evidence of malignancy. A result letter of this screening mammogram will be mailed directly to the patient. RECOMMENDATION: Screening mammogram in one year. (Code:SM-B-01Y) BI-RADS CATEGORY  1: Negative. Electronically Signed   By: Roda Cirri M.D.   On: 09/24/2023 12:25    Labs: Basic Metabolic Panel: Recent Labs  Lab 10/08/23 0827 10/09/23 1057 10/10/23 0759 10/11/23 0435 10/12/23 0502  NA 133* 134* 131* 132* 133*  K 3.2* 3.2* 3.2* 3.4* 3.2*  CL 98 98 97* 97* 97*  CO2 23 23 21* 23 22  GLUCOSE 155* 141* 148* 98 155*  BUN 51* 37* 41* 26* 33*  CREATININE 7.32* 5.83* 7.06* 5.26* 6.16*  CALCIUM  8.6* 9.1 8.8* 8.6* 8.5*  PHOS 3.8  --  3.9 3.7 4.2    CBC: Recent Labs  Lab 10/07/23 0153 10/08/23 0551 10/08/23 0827 10/09/23 0620 10/10/23 0759  WBC 11.1* 12.4* 11.8* 11.1* 13.5*  HGB 10.9* 10.3* 10.1* 9.3* 8.9*  HCT 32.5* 30.6* 30.1* 28.0* 26.4*  MCV 77.4* 76.7* 77.0* 77.8* 74.4*  PLT 159 164 165 157 192   Microbiology: Results for orders placed or performed during the hospital encounter of 10/01/23  Resp panel by RT-PCR (RSV, Flu A&B, Covid) Anterior Nasal Swab     Status: None   Collection Time: 10/01/23  9:12 AM   Specimen: Anterior Nasal Swab  Result Value Ref Range Status   SARS Coronavirus 2 by RT PCR NEGATIVE NEGATIVE Final    Comment: (NOTE) SARS-CoV-2 target nucleic acids are NOT DETECTED.  The SARS-CoV-2 RNA is generally detectable in upper respiratory specimens during the acute phase of infection. The lowest concentration of SARS-CoV-2 viral copies this assay can detect is 138 copies/mL. A negative result does not preclude SARS-Cov-2 infection and should not be used as the sole basis for treatment or other patient management decisions. A negative result may occur with  improper specimen collection/handling, submission of specimen other than nasopharyngeal swab, presence of viral mutation(s) within the areas targeted by this assay, and inadequate number of viral copies(<138 copies/mL). A negative result must be combined with clinical observations, patient history, and epidemiological information. The expected result is Negative.  Fact Sheet for Patients:  BloggerCourse.com  Fact Sheet for Healthcare Providers:  SeriousBroker.it  This test is no t yet approved or cleared by the [place]  FDA and  has been authorized for detection and/or diagnosis of SARS-CoV-2 by FDA under an Emergency Use Authorization (EUA). This EUA will remain  in effect (meaning this test can be used) for the duration of the COVID-19 declaration under Section 564(b)(1) of the Act,  21 U.S.C.section 360bbb-3(b)(1), unless the authorization is terminated  or revoked sooner.       Influenza A by PCR NEGATIVE NEGATIVE Final   Influenza B by PCR NEGATIVE NEGATIVE Final    Comment: (NOTE) The Xpert Xpress SARS-CoV-2/FLU/RSV plus assay is intended as an aid in the diagnosis of influenza from Nasopharyngeal swab specimens and should not be used as a sole basis for treatment. Nasal washings and aspirates are unacceptable for Xpert Xpress SARS-CoV-2/FLU/RSV testing.  Fact Sheet for Patients: BloggerCourse.com  Fact Sheet for Healthcare Providers: SeriousBroker.it  This test is not yet approved or cleared by the [place]  FDA and has been authorized for detection and/or diagnosis of SARS-CoV-2  by FDA under an Emergency Use Authorization (EUA). This EUA will remain in effect (meaning this test can be used) for the duration of the COVID-19 declaration under Section 564(b)(1) of the Act, 21 U.S.C. section 360bbb-3(b)(1), unless the authorization is terminated or revoked.     Resp Syncytial Virus by PCR NEGATIVE NEGATIVE Final    Comment: (NOTE) Fact Sheet for Patients: BloggerCourse.com  Fact Sheet for Healthcare Providers: SeriousBroker.it  This test is not yet approved or cleared by the [place]  FDA and has been authorized for detection and/or diagnosis of SARS-CoV-2 by FDA under an Emergency Use Authorization (EUA). This EUA will remain in effect (meaning this test can be used) for the duration of the COVID-19 declaration under Section 564(b)(1) of the Act, 21 U.S.C. section 360bbb-3(b)(1), unless the authorization is terminated or revoked.  Performed at [facility], [address], [place], [place] [address]   Blood Culture (routine x 2)     Status: Abnormal   Collection Time: 10/01/23  9:12 AM   Specimen: BLOOD  Result Value Ref Range  Status   Specimen Description   Final    BLOOD BLOOD LEFT FOREARM Performed at [facility], [address], [place], [place] [address]    Special Requests   Final    BOTTLES DRAWN AEROBIC AND ANAEROBIC Blood Culture results may not be optimal due to an inadequate volume of blood received in culture bottles Performed at [facility], [address] [place], [place] [address]    Culture  Setup Time   Final    GRAM NEGATIVE RODS IN BOTH AEROBIC AND ANAEROBIC BOTTLES CRITICAL VALUE NOTED.  VALUE IS CONSISTENT WITH PREVIOUSLY REPORTED AND CALLED VALUE. Performed at [facility], [address] [place], [place] [address]    Culture (A)  Final    SERRATIA MARCESCENS SUSCEPTIBILITIES PERFORMED ON PREVIOUS CULTURE WITHIN THE LAST 5 DAYS. Performed at [facility], [address] [place], [place] [address]    Report Status 10/04/2023 FINAL  Final  Respiratory (~20 pathogens) panel by PCR     Status: None   Collection Time: 10/01/23  9:12 AM   Specimen: Nasopharyngeal Swab; Respiratory  Result Value Ref Range Status   Adenovirus NOT DETECTED NOT DETECTED Final   Coronavirus 229E NOT DETECTED NOT DETECTED Final    Comment: (NOTE) The Coronavirus on the Respiratory Panel, DOES NOT test for the novel  Coronavirus (2019 nCoV)    Coronavirus HKU1 NOT DETECTED NOT DETECTED Final   Coronavirus NL63 NOT DETECTED NOT DETECTED Final   Coronavirus OC43 NOT DETECTED NOT DETECTED Final   Metapneumovirus NOT DETECTED NOT DETECTED Final   Rhinovirus / Enterovirus NOT DETECTED NOT DETECTED Final   Influenza A NOT DETECTED NOT DETECTED Final   Influenza B NOT DETECTED NOT DETECTED Final   Parainfluenza Virus 1 NOT DETECTED NOT DETECTED Final   Parainfluenza Virus 2 NOT DETECTED NOT DETECTED Final   Parainfluenza Virus 3 NOT DETECTED NOT DETECTED Final   Parainfluenza Virus 4 NOT DETECTED NOT DETECTED Final   Respiratory Syncytial Virus NOT DETECTED NOT DETECTED  Final   Bordetella pertussis NOT DETECTED NOT DETECTED Final   Bordetella Parapertussis NOT DETECTED NOT DETECTED Final   Chlamydophila pneumoniae NOT DETECTED NOT DETECTED Final   Mycoplasma pneumoniae NOT DETECTED NOT DETECTED Final    Comment: Performed at [facility], [address] [place], [place] [address]  Blood Culture (routine x 2)     Status: Abnormal  Collection Time: 10/01/23  9:33 AM   Specimen: BLOOD  Result Value Ref Range Status   Specimen Description   Final    BLOOD RIGHT WRIST Performed at [facility], [address], [place], [place] [address]    Special Requests   Final    BOTTLES DRAWN AEROBIC AND ANAEROBIC Blood Culture adequate volume Performed at [facility], [address] [place], [place] [address]    Culture  Setup Time   Final    GRAM NEGATIVE RODS IN BOTH AEROBIC AND ANAEROBIC BOTTLES CRITICAL RESULT CALLED TO, READ BACK BY AND VERIFIED WITH:  NATHAN BELUE AT 0457 10/02/23 JG Performed at [facility], [address] [place], [place] [address]    Culture SERRATIA MARCESCENS (A)  Final   Report Status 10/04/2023 FINAL  Final   Organism ID, Bacteria SERRATIA MARCESCENS  Final      Susceptibility   Serratia marcescens - MIC*    CEFEPIME  <=0.12 SENSITIVE Sensitive     CEFTAZIDIME <=1 SENSITIVE Sensitive     CEFTRIAXONE  <=0.25 SENSITIVE Sensitive     CIPROFLOXACIN  <=0.25 SENSITIVE Sensitive     GENTAMICIN <=1 SENSITIVE Sensitive     TRIMETH /SULFA  <=20 SENSITIVE Sensitive     * SERRATIA MARCESCENS  Blood Culture ID Panel (Reflexed)     Status: Abnormal   Collection Time: 10/01/23  9:33 AM  Result Value Ref Range Status   Enterococcus faecalis NOT DETECTED NOT DETECTED Final   Enterococcus Faecium NOT DETECTED NOT DETECTED Final   Listeria monocytogenes NOT DETECTED NOT DETECTED Final   Staphylococcus species NOT DETECTED NOT DETECTED Final   Staphylococcus aureus (BCID) NOT DETECTED NOT DETECTED Final    Staphylococcus epidermidis NOT DETECTED NOT DETECTED Final   Staphylococcus lugdunensis NOT DETECTED NOT DETECTED Final   Streptococcus species NOT DETECTED NOT DETECTED Final   Streptococcus agalactiae NOT DETECTED NOT DETECTED Final   Streptococcus pneumoniae NOT DETECTED NOT DETECTED Final   Streptococcus pyogenes NOT DETECTED NOT DETECTED Final   A.calcoaceticus-baumannii NOT DETECTED NOT DETECTED Final   Bacteroides fragilis NOT DETECTED NOT DETECTED Final   Enterobacterales DETECTED (A) NOT DETECTED Final    Comment: Enterobacterales represent a large order of gram negative bacteria, not a single organism. CRITICAL RESULT CALLED TO, READ BACK BY AND VERIFIED WITH:  NATHAN BELUE AT 0457 10/02/23 JG    Enterobacter cloacae complex NOT DETECTED NOT DETECTED Final   Escherichia coli NOT DETECTED NOT DETECTED Final   Klebsiella aerogenes NOT DETECTED NOT DETECTED Final   Klebsiella oxytoca NOT DETECTED NOT DETECTED Final   Klebsiella pneumoniae NOT DETECTED NOT DETECTED Final   Proteus species NOT DETECTED NOT DETECTED Final   Salmonella species NOT DETECTED NOT DETECTED Final   Serratia marcescens DETECTED (A) NOT DETECTED Final    Comment: CRITICAL RESULT CALLED TO, READ BACK BY AND VERIFIED WITH:  NATHAN BELUE AT 0457 10/02/23 JG    Haemophilus influenzae NOT DETECTED NOT DETECTED Final   Neisseria meningitidis NOT DETECTED NOT DETECTED Final   Pseudomonas aeruginosa NOT DETECTED NOT DETECTED Final   Stenotrophomonas maltophilia NOT DETECTED NOT DETECTED Final   Candida albicans NOT DETECTED NOT DETECTED Final   Candida auris NOT DETECTED NOT DETECTED Final   Candida glabrata NOT DETECTED NOT DETECTED Final   Candida krusei NOT DETECTED NOT DETECTED Final   Candida parapsilosis NOT DETECTED NOT DETECTED Final   Candida tropicalis NOT DETECTED NOT DETECTED Final   Cryptococcus neoformans/gattii NOT DETECTED NOT DETECTED Final  CTX-M ESBL NOT DETECTED NOT DETECTED Final    Carbapenem resistance IMP NOT DETECTED NOT DETECTED Final   Carbapenem resistance KPC NOT DETECTED NOT DETECTED Final   Carbapenem resistance NDM NOT DETECTED NOT DETECTED Final   Carbapenem resist OXA 48 LIKE NOT DETECTED NOT DETECTED Final   Carbapenem resistance VIM NOT DETECTED NOT DETECTED Final    Comment: Performed at [facility], [address] [place], [place] [address]  MRSA Next Gen by PCR, Nasal     Status: None   Collection Time: 10/02/23  5:26 PM   Specimen: Nasal Mucosa; Nasal Swab  Result Value Ref Range Status   MRSA by PCR Next Gen NOT DETECTED NOT DETECTED Final    Comment: (NOTE) The GeneXpert MRSA Assay (FDA approved for NASAL specimens only), is one component of a comprehensive MRSA colonization surveillance program. It is not intended to diagnose MRSA infection nor to guide or monitor treatment for MRSA infections. Test performance is not FDA approved in patients less than 63 years old. Performed at [facility], [address], [place], [place] [address]   Cath Tip Culture     Status: Abnormal   Collection Time: 10/02/23  9:28 PM   Specimen: Catheter Tip; Other  Result Value Ref Range Status   Specimen Description   Final    CATH TIP Performed at [facility], [address] [place], [place] [address]    Special Requests   Final    NONE Performed at [facility], [address] [place], [place] [address]    Culture >=100,000 COLONIES/mL SERRATIA MARCESCENS (A)  Final   Report Status 10/08/2023 FINAL  Final   Organism ID, Bacteria SERRATIA MARCESCENS (A)  Final      Susceptibility   Serratia marcescens - MIC*    CEFEPIME  <=0.12 SENSITIVE Sensitive     CEFTAZIDIME <=1 SENSITIVE Sensitive     CEFTRIAXONE  <=0.25 SENSITIVE Sensitive     CIPROFLOXACIN  <=0.25 SENSITIVE Sensitive     GENTAMICIN <=1 SENSITIVE Sensitive     TRIMETH /SULFA  <=20 SENSITIVE Sensitive     * >=100,000 COLONIES/mL SERRATIA MARCESCENS   Culture, blood (Routine X 2) w Reflex to ID Panel     Status: None   Collection Time: 10/03/23  1:00 PM   Specimen: BLOOD  Result Value Ref Range Status   Specimen Description BLOOD BLOOD LEFT WRIST  Final   Special Requests   Final    BOTTLES DRAWN AEROBIC ONLY Blood Culture adequate volume   Culture   Final    NO GROWTH 5 DAYS Performed at [facility], [address] [place], [place] [address]    Report Status 10/08/2023 FINAL  Final  Culture, blood (Routine X 2) w Reflex to ID Panel     Status: None   Collection Time: 10/03/23  1:04 PM   Specimen: BLOOD  Result Value Ref Range Status   Specimen Description BLOOD BLOOD LEFT FOREARM  Final   Special Requests   Final    BOTTLES DRAWN AEROBIC ONLY Blood Culture adequate volume   Culture   Final    NO GROWTH 5 DAYS Performed at [facility], [address], [place], [place] [address]    Report Status 10/08/2023 FINAL  Final    Time coordinating discharge: Over 30 minutes  Ree Candy, MD  [facility] 10/12/2023, 2:41 PM

## 2023-10-12 NOTE — Progress Notes (Signed)
 Hemodialysis Note:  Received patient in bed to unit. Alert and oriented. Informed consent singed and in chart.  Treatment initiated: 1538 Treatment completed: 1850  Access used: Left Femoral catheter Access issues: Clotted, instilled cathflo on each port  Patient was able to finish her treatment at 250 BFR,. Tolerated well and transported back to room, alert without acute distress. Report given to patient's RN.  Total UF removed: 1.7 liters Medications given: Retacrit  4000 units IV  Post HD weight: 99.1 Kg  [facility]

## 2023-10-12 NOTE — Progress Notes (Signed)
 [facility]  ROUNDING NOTE   Subjective:   Amy Wu  is a 73 y.o.  female with past medical history of hypertension, diabetes, PVD, dyslipidemia, and end-stage renal disease on dialysis. She presents to the ED via EMS for evaluation of altered mental status and fever and has been admitted under observation for Acute respiratory failure with hypoxia (HCC) [J96.01] Sepsis (HCC) [A41.9] Sepsis, due to unspecified organism, unspecified whether acute organ dysfunction present [facility]) [A41.9]  Patient is known to our practice and receives outpatient dialysis treatments at [facility] on a MWF schedule, supervised by [facility] physicians.   Update:  Patient seen sitting up in bed Alert and oriented Dysphagia noted but improving   Objective:  Vital signs in last 24 hours:  Temp:  [97.5 F (36.4 C)-98.2 F (36.8 C)] 97.5 F (36.4 C) (04/25 0801) Pulse Rate:  [72-78] 76 (04/25 0801) Resp:  [16-20] 18 (04/25 0801) BP: (119-156)/(44-57) 156/57 (04/25 0801) SpO2:  [97 %-100 %] 100 % (04/25 0801) Weight:  [100.8 kg] 100.8 kg (04/25 0457)  Weight change: 5.6 kg Filed Weights   10/10/23 0821 10/11/23 0606 10/12/23 0457  Weight: 95.2 kg 97.2 kg 100.8 kg    Intake/Output: I/O last 3 completed shifts: In: 404.2 [P.O.:300; IV Piggyback:104.2] Out: -    Intake/Output this shift:  No intake/output data recorded.  Physical Exam: General: NAD  Head: Normocephalic, atraumatic.   Eyes: Anicteric  Lungs:  Diminished, [place] O2  Heart: Regular rate and rhythm  Abdomen:  Soft, nontender  Extremities: Trace Rt stump peripheral edema. Bilat AKA  Neurologic: Alert, moving all four extremities  Skin: No lesions  Access: Lt thigh permcath placed 4/18    Basic Metabolic Panel: Recent Labs  Lab 10/08/23 0827 10/09/23 1057 10/10/23 0759 10/11/23 0435 10/12/23 0502  NA 133* 134* 131* 132* 133*  K 3.2* 3.2* 3.2* 3.4* 3.2*  CL 98 98 97* 97* 97*  CO2 23 23 21* 23 22   GLUCOSE 155* 141* 148* 98 155*  BUN 51* 37* 41* 26* 33*  CREATININE 7.32* 5.83* 7.06* 5.26* 6.16*  CALCIUM  8.6* 9.1 8.8* 8.6* 8.5*  PHOS 3.8  --  3.9 3.7 4.2    Liver Function Tests: Recent Labs  Lab 10/08/23 0827 10/10/23 0759 10/11/23 0435 10/12/23 0502  ALBUMIN 2.5* 2.7* 2.6* 2.6*   No results for input(s): "LIPASE", "AMYLASE" in the last 168 hours. No results for input(s): "AMMONIA" in the last 168 hours.   CBC: Recent Labs  Lab 10/07/23 0153 10/08/23 0551 10/08/23 0827 10/09/23 0620 10/10/23 0759  WBC 11.1* 12.4* 11.8* 11.1* 13.5*  HGB 10.9* 10.3* 10.1* 9.3* 8.9*  HCT 32.5* 30.6* 30.1* 28.0* 26.4*  MCV 77.4* 76.7* 77.0* 77.8* 74.4*  PLT 159 164 165 157 192    Cardiac Enzymes: No results for input(s): "CKTOTAL", "CKMB", "CKMBINDEX", "TROPONINI" in the last 168 hours.  BNP: Invalid input(s): "POCBNP"  CBG: Recent Labs  Lab 10/11/23 1405 10/11/23 1657 10/11/23 2107 10/12/23 0806 10/12/23 1133  GLUCAP 167* 193* 162* 147* 245*    Microbiology: Results for orders placed or performed during the hospital encounter of 10/01/23  Resp panel by RT-PCR (RSV, Flu A&B, Covid) Anterior Nasal Swab     Status: None   Collection Time: 10/01/23  9:12 AM   Specimen: Anterior Nasal Swab  Result Value Ref Range Status   SARS Coronavirus 2 by RT PCR NEGATIVE NEGATIVE Final    Comment: (NOTE) SARS-CoV-2 target nucleic acids are NOT DETECTED.  The  SARS-CoV-2 RNA is generally detectable in upper respiratory specimens during the acute phase of infection. The lowest concentration of SARS-CoV-2 viral copies this assay can detect is 138 copies/mL. A negative result does not preclude SARS-Cov-2 infection and should not be used as the sole basis for treatment or other patient management decisions. A negative result may occur with  improper specimen collection/handling, submission of specimen other than nasopharyngeal swab, presence of viral mutation(s) within the areas  targeted by this assay, and inadequate number of viral copies(<138 copies/mL). A negative result must be combined with clinical observations, patient history, and epidemiological information. The expected result is Negative.  Fact Sheet for Patients:  BloggerCourse.com  Fact Sheet for Healthcare Providers:  SeriousBroker.it  This test is no t yet approved or cleared by the [place]  FDA and  has been authorized for detection and/or diagnosis of SARS-CoV-2 by FDA under an Emergency Use Authorization (EUA). This EUA will remain  in effect (meaning this test can be used) for the duration of the COVID-19 declaration under Section 564(b)(1) of the Act, 21 U.S.C.section 360bbb-3(b)(1), unless the authorization is terminated  or revoked sooner.       Influenza A by PCR NEGATIVE NEGATIVE Final   Influenza B by PCR NEGATIVE NEGATIVE Final    Comment: (NOTE) The Xpert Xpress SARS-CoV-2/FLU/RSV plus assay is intended as an aid in the diagnosis of influenza from Nasopharyngeal swab specimens and should not be used as a sole basis for treatment. Nasal washings and aspirates are unacceptable for Xpert Xpress SARS-CoV-2/FLU/RSV testing.  Fact Sheet for Patients: BloggerCourse.com  Fact Sheet for Healthcare Providers: SeriousBroker.it  This test is not yet approved or cleared by the [place]  FDA and has been authorized for detection and/or diagnosis of SARS-CoV-2 by FDA under an Emergency Use Authorization (EUA). This EUA will remain in effect (meaning this test can be used) for the duration of the COVID-19 declaration under Section 564(b)(1) of the Act, 21 U.S.C. section 360bbb-3(b)(1), unless the authorization is terminated or revoked.     Resp Syncytial Virus by PCR NEGATIVE NEGATIVE Final    Comment: (NOTE) Fact Sheet for  Patients: BloggerCourse.com  Fact Sheet for Healthcare Providers: SeriousBroker.it  This test is not yet approved or cleared by the [place]  FDA and has been authorized for detection and/or diagnosis of SARS-CoV-2 by FDA under an Emergency Use Authorization (EUA). This EUA will remain in effect (meaning this test can be used) for the duration of the COVID-19 declaration under Section 564(b)(1) of the Act, 21 U.S.C. section 360bbb-3(b)(1), unless the authorization is terminated or revoked.  Performed at [facility], [address] [place], [place] [address]   Blood Culture (routine x 2)     Status: Abnormal   Collection Time: 10/01/23  9:12 AM   Specimen: BLOOD  Result Value Ref Range Status   Specimen Description   Final    BLOOD BLOOD LEFT FOREARM Performed at [facility], [address] [place], [place] [address]    Special Requests   Final    BOTTLES DRAWN AEROBIC AND ANAEROBIC Blood Culture results may not be optimal due to an inadequate volume of blood received in culture bottles Performed at [facility], [address] [place], [place] [address]    Culture  Setup Time   Final    GRAM NEGATIVE RODS IN BOTH AEROBIC AND ANAEROBIC BOTTLES CRITICAL VALUE NOTED.  VALUE IS CONSISTENT WITH PREVIOUSLY REPORTED AND CALLED VALUE. Performed at [facility], [phone]  [address] [place], [place] [address]    Culture (A)  Final    SERRATIA MARCESCENS SUSCEPTIBILITIES PERFORMED ON PREVIOUS CULTURE WITHIN THE LAST 5 DAYS. Performed at [facility], [address], [place], [place] [address]    Report Status 10/04/2023 FINAL  Final  Respiratory (~20 pathogens) panel by PCR     Status: None   Collection Time: 10/01/23  9:12 AM   Specimen: Nasopharyngeal Swab; Respiratory  Result Value Ref Range Status   Adenovirus NOT DETECTED NOT DETECTED Final   Coronavirus 229E NOT DETECTED NOT  DETECTED Final    Comment: (NOTE) The Coronavirus on the Respiratory Panel, DOES NOT test for the novel  Coronavirus (2019 nCoV)    Coronavirus HKU1 NOT DETECTED NOT DETECTED Final   Coronavirus NL63 NOT DETECTED NOT DETECTED Final   Coronavirus OC43 NOT DETECTED NOT DETECTED Final   Metapneumovirus NOT DETECTED NOT DETECTED Final   Rhinovirus / Enterovirus NOT DETECTED NOT DETECTED Final   Influenza A NOT DETECTED NOT DETECTED Final   Influenza B NOT DETECTED NOT DETECTED Final   Parainfluenza Virus 1 NOT DETECTED NOT DETECTED Final   Parainfluenza Virus 2 NOT DETECTED NOT DETECTED Final   Parainfluenza Virus 3 NOT DETECTED NOT DETECTED Final   Parainfluenza Virus 4 NOT DETECTED NOT DETECTED Final   Respiratory Syncytial Virus NOT DETECTED NOT DETECTED Final   Bordetella pertussis NOT DETECTED NOT DETECTED Final   Bordetella Parapertussis NOT DETECTED NOT DETECTED Final   Chlamydophila pneumoniae NOT DETECTED NOT DETECTED Final   Mycoplasma pneumoniae NOT DETECTED NOT DETECTED Final    Comment: Performed at [facility], [address], [place], [place] [address]  Blood Culture (routine x 2)     Status: Abnormal   Collection Time: 10/01/23  9:33 AM   Specimen: BLOOD  Result Value Ref Range Status   Specimen Description   Final    BLOOD RIGHT WRIST Performed at [facility], [address] [place], [place] [address]    Special Requests   Final    BOTTLES DRAWN AEROBIC AND ANAEROBIC Blood Culture adequate volume Performed at [facility], [address], [place], [place] [address]    Culture  Setup Time   Final    GRAM NEGATIVE RODS IN BOTH AEROBIC AND ANAEROBIC BOTTLES CRITICAL RESULT CALLED TO, READ BACK BY AND VERIFIED WITH:  NATHAN BELUE AT 0457 10/02/23 JG Performed at [facility], [address], [place], [place] [address]    Culture SERRATIA MARCESCENS (A)  Final   Report Status 10/04/2023 FINAL  Final   Organism ID, Bacteria SERRATIA  MARCESCENS  Final      Susceptibility   Serratia marcescens - MIC*    CEFEPIME  <=0.12 SENSITIVE Sensitive     CEFTAZIDIME <=1 SENSITIVE Sensitive     CEFTRIAXONE  <=0.25 SENSITIVE Sensitive     CIPROFLOXACIN  <=0.25 SENSITIVE Sensitive     GENTAMICIN <=1 SENSITIVE Sensitive     TRIMETH /SULFA  <=20 SENSITIVE Sensitive     * SERRATIA MARCESCENS  Blood Culture ID Panel (Reflexed)     Status: Abnormal   Collection Time: 10/01/23  9:33 AM  Result Value Ref Range Status   Enterococcus faecalis NOT DETECTED NOT DETECTED Final   Enterococcus Faecium NOT DETECTED NOT DETECTED Final   Listeria monocytogenes NOT DETECTED NOT DETECTED Final   Staphylococcus species NOT DETECTED NOT DETECTED Final   Staphylococcus aureus (BCID) NOT DETECTED NOT DETECTED Final   Staphylococcus epidermidis NOT DETECTED NOT  DETECTED Final   Staphylococcus lugdunensis NOT DETECTED NOT DETECTED Final   Streptococcus species NOT DETECTED NOT DETECTED Final   Streptococcus agalactiae NOT DETECTED NOT DETECTED Final   Streptococcus pneumoniae NOT DETECTED NOT DETECTED Final   Streptococcus pyogenes NOT DETECTED NOT DETECTED Final   A.calcoaceticus-baumannii NOT DETECTED NOT DETECTED Final   Bacteroides fragilis NOT DETECTED NOT DETECTED Final   Enterobacterales DETECTED (A) NOT DETECTED Final    Comment: Enterobacterales represent a large order of gram negative bacteria, not a single organism. CRITICAL RESULT CALLED TO, READ BACK BY AND VERIFIED WITH:  NATHAN BELUE AT 0457 10/02/23 JG    Enterobacter cloacae complex NOT DETECTED NOT DETECTED Final   Escherichia coli NOT DETECTED NOT DETECTED Final   Klebsiella aerogenes NOT DETECTED NOT DETECTED Final   Klebsiella oxytoca NOT DETECTED NOT DETECTED Final   Klebsiella pneumoniae NOT DETECTED NOT DETECTED Final   Proteus species NOT DETECTED NOT DETECTED Final   Salmonella species NOT DETECTED NOT DETECTED Final   Serratia marcescens DETECTED (A) NOT DETECTED Final     Comment: CRITICAL RESULT CALLED TO, READ BACK BY AND VERIFIED WITH:  NATHAN BELUE AT 0457 10/02/23 JG    Haemophilus influenzae NOT DETECTED NOT DETECTED Final   Neisseria meningitidis NOT DETECTED NOT DETECTED Final   Pseudomonas aeruginosa NOT DETECTED NOT DETECTED Final   Stenotrophomonas maltophilia NOT DETECTED NOT DETECTED Final   Candida albicans NOT DETECTED NOT DETECTED Final   Candida auris NOT DETECTED NOT DETECTED Final   Candida glabrata NOT DETECTED NOT DETECTED Final   Candida krusei NOT DETECTED NOT DETECTED Final   Candida parapsilosis NOT DETECTED NOT DETECTED Final   Candida tropicalis NOT DETECTED NOT DETECTED Final   Cryptococcus neoformans/gattii NOT DETECTED NOT DETECTED Final   CTX-M ESBL NOT DETECTED NOT DETECTED Final   Carbapenem resistance IMP NOT DETECTED NOT DETECTED Final   Carbapenem resistance KPC NOT DETECTED NOT DETECTED Final   Carbapenem resistance NDM NOT DETECTED NOT DETECTED Final   Carbapenem resist OXA 48 LIKE NOT DETECTED NOT DETECTED Final   Carbapenem resistance VIM NOT DETECTED NOT DETECTED Final    Comment: Performed at [facility], [address] [place], [place] [address]  MRSA Next Gen by PCR, Nasal     Status: None   Collection Time: 10/02/23  5:26 PM   Specimen: Nasal Mucosa; Nasal Swab  Result Value Ref Range Status   MRSA by PCR Next Gen NOT DETECTED NOT DETECTED Final    Comment: (NOTE) The GeneXpert MRSA Assay (FDA approved for NASAL specimens only), is one component of a comprehensive MRSA colonization surveillance program. It is not intended to diagnose MRSA infection nor to guide or monitor treatment for MRSA infections. Test performance is not FDA approved in patients less than 73 years old. Performed at [facility], [address] [place], [place] [address]   Cath Tip Culture     Status: Abnormal   Collection Time: 10/02/23  9:28 PM   Specimen: Catheter Tip; Other  Result Value Ref Range Status    Specimen Description   Final    CATH TIP Performed at [facility], [address], [place], [place] [address]    Special Requests   Final    NONE Performed at [facility], [address] [place], [place] [address]    Culture >=100,000 COLONIES/mL SERRATIA MARCESCENS (A)  Final   Report Status 10/08/2023 FINAL  Final   Organism ID, Bacteria SERRATIA MARCESCENS (A)  Final      Susceptibility   Serratia marcescens - MIC*    CEFEPIME  <=0.12 SENSITIVE Sensitive     CEFTAZIDIME <=1 SENSITIVE Sensitive     CEFTRIAXONE  <=0.25 SENSITIVE Sensitive     CIPROFLOXACIN  <=0.25 SENSITIVE Sensitive     GENTAMICIN <=1 SENSITIVE Sensitive     TRIMETH /SULFA  <=20 SENSITIVE Sensitive     * >=100,000 COLONIES/mL SERRATIA MARCESCENS  Culture, blood (Routine X 2) w Reflex to ID Panel     Status: None   Collection Time: 10/03/23  1:00 PM   Specimen: BLOOD  Result Value Ref Range Status   Specimen Description BLOOD BLOOD LEFT WRIST  Final   Special Requests   Final    BOTTLES DRAWN AEROBIC ONLY Blood Culture adequate volume   Culture   Final    NO GROWTH 5 DAYS Performed at [facility], [address] [place], [place] [address]    Report Status 10/08/2023 FINAL  Final  Culture, blood (Routine X 2) w Reflex to ID Panel     Status: None   Collection Time: 10/03/23  1:04 PM   Specimen: BLOOD  Result Value Ref Range Status   Specimen Description BLOOD BLOOD LEFT FOREARM  Final   Special Requests   Final    BOTTLES DRAWN AEROBIC ONLY Blood Culture adequate volume   Culture   Final    NO GROWTH 5 DAYS Performed at [facility], [address] [place], [place] [address]    Report Status 10/08/2023 FINAL  Final    Coagulation Studies: No results for input(s): "LABPROT", "INR" in the last 72 hours.    Urinalysis: No results for input(s): "COLORURINE", "LABSPEC", "PHURINE", "GLUCOSEU", "HGBUR", "BILIRUBINUR", "KETONESUR", "PROTEINUR", "UROBILINOGEN",  "NITRITE", "LEUKOCYTESUR" in the last 72 hours.  Invalid input(s): "APPERANCEUR"    Imaging: MR BRAIN W WO CONTRAST Result Date: 10/11/2023 CLINICAL DATA:  Provided history: Mental status change, persistent or worsening. EXAM: MRI HEAD WITHOUT AND WITH CONTRAST TECHNIQUE: Multiplanar, multiecho pulse sequences of the brain and surrounding structures were obtained without and with intravenous contrast. CONTRAST:  10mL GADAVIST  GADOBUTROL  1 MMOL/ML IV SOLN COMPARISON:  Head CT 10/09/2023.  Brain MRI 05/24/2021. FINDINGS: FINDINGS : Brain: Mild generalized cerebral atrophy. 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 Multifocal T2 FLAIR hyperintense signal abnormality within the cerebral white matter, nonspecific but compatible with mild chronic small vessel ischemic disease. 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 Chronic microhemorrhages scattered within the brainstem and supratentorial brain. There is no acute infarct. No evidence of an intracranial mass. No extra-axial fluid collection. No midline shift. Mild but diffuse smooth pachymeningeal dural thickening and enhancement along the bilateral cerebral convexities. No pathologic parenchymal enhancement. Vascular: Maintained flow voids within the proximal large arterial vessels. Skull and upper cervical spine: Abnormal T1 hypointense marrow signal within the calvarium and within visible portions of the cervical spine. Sinuses/Orbits: No mass or acute finding within the imaged orbits. Prior ocular lens replacement. Right scleral buckle. No significant paranasal sinus disease. IMPRESSION: 1. No evidence of an acute or recent subacute infarction. 2. Mild but diffuse smooth pachymeningeal dural thickening and enhancement along the bilateral cerebral convexities, nonspecific. Consider lumbar puncture and CSF analysis if there is concern for infectious  meningitis. 3. 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 4. Mild chronic small ischemic changes within the cerebral white matter.  5. Chronic microhemorrhages scattered within the brainstem and supratentorial brain. Findings likely at least partly reflect sequelae of chronic hypertensive microangiopathy. However, superimposed manifestations of cerebral amyloid angiopathy cannot be excluded. 6. Mild generalized cerebral atrophy. 7. Abnormal T1 hypointense marrow signal within the calvarium and within visible portions of the cervical spine. While this finding can reflect a marrow infiltrative process, the most common causes include chronic anemia, smoking and obesity. Electronically Signed   By: Bascom Lily D.O.   On: 10/11/2023 15:58        Medications:       amiodarone   200 mg Oral BID   apixaban   5 mg Oral BID   atorvastatin   80 mg Oral Daily   Chlorhexidine  Gluconate Cloth  6 each Topical Q0600   epoetin  alfa-epbx (RETACRIT ) injection  4,000 Units Intravenous Q M,W,F-1800   insulin  aspart  0-15 Units Subcutaneous TID WC   insulin  aspart  3 Units Subcutaneous TID WC   predniSONE   20 mg Oral Q breakfast   sulfamethoxazole -trimethoprim   1 tablet Oral Q12H   acetaminophen , camphor-menthol , gabapentin , Gerhardt's butt cream, ondansetron  **OR** ondansetron  (ZOFRAN ) IV  Assessment/ Plan:  Ms. Amy Wu is a 73 y.o.  female with past medical history of hypertension, diabetes, PVD, dyslipidemia, and end-stage renal disease on dialysis. She presents to the ED via EMS for evaluation of altered mental status and fever and has been admitted under observation for Acute respiratory failure with hypoxia (HCC) [J96.01] Sepsis (HCC) [A41.9] Sepsis, due to unspecified organism, unspecified whether acute organ dysfunction present (HCC) [A41.9]  [facility]/MWF/Rt thigh Permcath  End stage  renal disease on hemodialysis.  Patient scheduled to receive dialysis later today.  Sepsis, secondary to line infection. Admitted with fever 103, and elevated white count, 12.2. Blood culture positive for serratia marcescens. ID request removal of tunneled catheter, removed on 4/15 with tip sent for culture. Catheter positive for same bacteria.    Appreciate vascular replacing HD permcath.  Patient experienced confusion with cefepime , discontinued.  Patient has been transitioned to oral antibiotics at discharge.  Outpatient clinic notified, no further need for IV antibiotics with dialysis.  3. Acute respiratory failure requiring Bipap on admission d/t volume overload.  Remains on room air  4. Anemia of chronic kidney disease Lab Results  Component Value Date   HGB 8.9 (L) 10/10/2023  Hemoglobin 8.9. Mircera as outpatient. Continue low dose EPO with treatment.   5. Secondary Hyperparathyroidism: with outpatient labs: None available   Lab Results  Component Value Date   CALCIUM  8.5 (L) 10/12/2023   PHOS 4.2 10/12/2023    Continue periodically monitor bone metabolism parameters as outpatient.  Will continue to monitor bone minerals.   LOS: 10 [address] Schlauch 4/25/202512:45 PM

## 2023-10-16 ENCOUNTER — Inpatient Hospital Stay: Payer: Medicare (Managed Care) | Admitting: Infectious Diseases

## 2023-10-18 DIAGNOSIS — I214 Non-ST elevation (NSTEMI) myocardial infarction: Secondary | ICD-10-CM

## 2023-10-18 HISTORY — DX: Non-ST elevation (NSTEMI) myocardial infarction: I21.4

## 2023-10-29 ENCOUNTER — Telehealth (INDEPENDENT_AMBULATORY_CARE_PROVIDER_SITE_OTHER): Payer: Self-pay

## 2023-10-29 NOTE — Telephone Encounter (Signed)
 Spoke with the patient and she was offered 10/31/23 and patient declined. Patient was scheduled for 11/01/23 with a 1:30 pm arrival time to the [facility]. Pre-procedure instructions will be sent to [facility] for the patient.

## 2023-10-31 ENCOUNTER — Other Ambulatory Visit
Admission: RE | Admit: 2023-10-31 | Discharge: 2023-10-31 | Disposition: A | Discharge status: Home / Self Care | Payer: Medicare (Managed Care) | Source: Ambulatory Visit | Attending: Nephrology | Admitting: Nephrology

## 2023-10-31 DIAGNOSIS — E875 Hyperkalemia: Secondary | ICD-10-CM | POA: Diagnosis present

## 2023-10-31 LAB — POTASSIUM: Potassium: 3.9 mmol/L (ref 3.5–5.1)

## 2023-11-01 ENCOUNTER — Ambulatory Visit
Admission: RE | Admit: 2023-11-01 | Discharge: 2023-11-01 | Disposition: A | Discharge status: Home / Self Care | Payer: Medicare (Managed Care) | Attending: Vascular Surgery | Admitting: Vascular Surgery

## 2023-11-01 ENCOUNTER — Encounter: Payer: Self-pay | Admitting: Vascular Surgery

## 2023-11-01 ENCOUNTER — Encounter
Admission: RE | Disposition: A | Discharge status: Home / Self Care | Payer: Self-pay | Source: Home / Self Care | Attending: Vascular Surgery

## 2023-11-01 ENCOUNTER — Other Ambulatory Visit: Payer: Self-pay

## 2023-11-01 DIAGNOSIS — N186 End stage renal disease: Secondary | ICD-10-CM | POA: Insufficient documentation

## 2023-11-01 DIAGNOSIS — Z4901 Encounter for fitting and adjustment of extracorporeal dialysis catheter: Secondary | ICD-10-CM | POA: Insufficient documentation

## 2023-11-01 HISTORY — PX: DIALYSIS/PERMA CATHETER INSERTION: CATH118288

## 2023-11-01 LAB — POTASSIUM (ARMC VASCULAR LAB ONLY): Potassium (ARMC vascular lab): 3.9 mmol/L (ref 3.5–5.1)

## 2023-11-01 LAB — GLUCOSE, CAPILLARY: Glucose-Capillary: 137 mg/dL — ABNORMAL HIGH (ref 70–99)

## 2023-11-01 SURGERY — DIALYSIS/PERMA CATHETER INSERTION
Anesthesia: Moderate Sedation

## 2023-11-01 MED ORDER — LIDOCAINE-EPINEPHRINE (PF) 1 %-1:200000 IJ SOLN
INTRAMUSCULAR | Status: DC | PRN
  Administered 2023-11-01: 20 mL

## 2023-11-01 MED ORDER — FENTANYL CITRATE (PF) 100 MCG/2ML IJ SOLN
INTRAMUSCULAR | Status: DC | PRN
  Administered 2023-11-01: 50 ug via INTRAVENOUS

## 2023-11-01 MED ORDER — MIDAZOLAM HCL 5 MG/5ML IJ SOLN
INTRAMUSCULAR | Status: AC
  Filled 2023-11-01: qty 5

## 2023-11-01 MED ORDER — VANCOMYCIN HCL IN DEXTROSE 1-5 GM/200ML-% IV SOLN
INTRAVENOUS | Status: AC
Start: 2023-11-01 — End: ?
  Filled 2023-11-01: qty 200

## 2023-11-01 MED ORDER — ONDANSETRON HCL 4 MG/2ML IJ SOLN: 4.0000 mg | Freq: Four times a day (QID) | INTRAMUSCULAR | Status: DC | PRN

## 2023-11-01 MED ORDER — SODIUM CHLORIDE 0.9 % IV SOLN: INTRAVENOUS | Status: DC

## 2023-11-01 MED ORDER — MIDAZOLAM HCL 2 MG/2ML IJ SOLN
INTRAMUSCULAR | Status: DC | PRN
Start: 2023-11-01 — End: 2023-11-01
  Administered 2023-11-01: 1 mg via INTRAVENOUS

## 2023-11-01 MED ORDER — HYDROMORPHONE HCL 1 MG/ML IJ SOLN: 1.0000 mg | Freq: Once | INTRAMUSCULAR | Status: DC | PRN

## 2023-11-01 MED ORDER — FENTANYL CITRATE (PF) 100 MCG/2ML IJ SOLN
INTRAMUSCULAR | Status: AC
  Filled 2023-11-01: qty 2

## 2023-11-01 MED ORDER — FAMOTIDINE 20 MG PO TABS
ORAL_TABLET | ORAL | Status: AC
  Filled 2023-11-01: qty 2

## 2023-11-01 MED ORDER — HEPARIN (PORCINE) IN NACL 1000-0.9 UT/500ML-% IV SOLN
INTRAVENOUS | Status: DC | PRN
  Administered 2023-11-01: 500 mL

## 2023-11-01 MED ORDER — METHYLPREDNISOLONE SODIUM SUCC 125 MG IJ SOLR
125.0000 mg | Freq: Once | INTRAMUSCULAR | Status: AC | PRN
  Administered 2023-11-01: 125 mg via INTRAVENOUS

## 2023-11-01 MED ORDER — MIDAZOLAM HCL 2 MG/ML PO SYRP: 8.0000 mg | ORAL_SOLUTION | Freq: Once | ORAL | Status: DC | PRN

## 2023-11-01 MED ORDER — HEPARIN SODIUM (PORCINE) 10000 UNIT/ML IJ SOLN
INTRAMUSCULAR | Status: DC | PRN
  Administered 2023-11-01: 10000 [IU]

## 2023-11-01 MED ORDER — DIPHENHYDRAMINE HCL 50 MG/ML IJ SOLN
INTRAMUSCULAR | Status: AC
Start: 2023-11-01 — End: ?
  Filled 2023-11-01: qty 1

## 2023-11-01 MED ORDER — METHYLPREDNISOLONE SODIUM SUCC 125 MG IJ SOLR
INTRAMUSCULAR | Status: AC
  Filled 2023-11-01: qty 2

## 2023-11-01 MED ORDER — FAMOTIDINE 20 MG PO TABS
40.0000 mg | ORAL_TABLET | Freq: Once | ORAL | Status: AC | PRN
  Administered 2023-11-01: 40 mg via ORAL

## 2023-11-01 MED ORDER — VANCOMYCIN HCL IN DEXTROSE 1-5 GM/200ML-% IV SOLN
1000.0000 mg | INTRAVENOUS | Status: AC
  Administered 2023-11-01: 1000 mg via INTRAVENOUS

## 2023-11-01 MED ORDER — DIPHENHYDRAMINE HCL 50 MG/ML IJ SOLN: 50.0000 mg | Freq: Once | INTRAMUSCULAR | Status: DC | PRN

## 2023-11-01 SURGICAL SUPPLY — 6 items
BIOPATCH RED 1 DISK 7.0 (GAUZE/BANDAGES/DRESSINGS) IMPLANT
GUIDEWIRE SUPER STIFF .035X180 (WIRE) IMPLANT
KIT CATH CHRNC PALINDROME PRCS (CATHETERS) IMPLANT
PACK ANGIOGRAPHY (CUSTOM PROCEDURE TRAY) ×1 IMPLANT
SUT MNCRL AB 4-0 PS2 18 (SUTURE) IMPLANT
SUT PROLENE 0 CT 1 30 (SUTURE) IMPLANT

## 2023-11-01 NOTE — Op Note (Signed)
 OPERATIVE NOTE    PRE-OPERATIVE DIAGNOSIS: 1. ESRD 2. Non-functional permcath  POST-OPERATIVE DIAGNOSIS: same as above  PROCEDURE: Fluoroscopic guidance for placement of catheter Placement of a 44 cm tip to cuff tunneled hemodialysis catheter via the left femoral vein and removal of previous catheter  SURGEON: Mikki Alexander, MD  ANESTHESIA:  Local with moderate conscious sedation for 17 minutes using 1 mg of Versed  and 50 mcg of Fentanyl   ESTIMATED BLOOD LOSS: 3 cc  FINDING(S): none  SPECIMEN(S):  None  INDICATIONS:   Patient is a 73 y.o.female who presents with non-functional dialysis catheter and ESRD.  The patient needs long term dialysis access for their ESRD, and a Permcath is necessary.  Risks and benefits are discussed and informed consent is obtained.    DESCRIPTION: After obtaining full informed written consent, the patient was brought back to the vascular suite. The patient received moderate conscious sedation during a face-to-face encounter with me present throughout the entire procedure and supervising the RN monitoring the vital signs, pulse oximetry, telemetry, and mental status throughout the entire procedure. The patient's existing catheter, left thigh and groin were sterilely prepped and draped in a sterile surgical field was created.  The existing catheter was dissected free from the fibrous sheath securing the cuff with hemostats and blunt dissection.  A wire was placed. The existing catheter was then removed and the wire used to keep venous access. I selected a 44 cm tip to cuff tunneled dialysis catheter.  Using fluoroscopic guidance the catheter tips were parked in the right atrium. The appropriate distal connectors were placed. It withdrew blood well and flushed easily with heparinized saline and a concentrated heparin  solution was then placed. It was secured to the chest wall with 2 Prolene sutures. A 4-0 Monocryl pursestring suture was placed around the exit site.  Sterile dressings were placed. The patient tolerated the procedure well and was taken to the recovery room in stable condition.  COMPLICATIONS: None  CONDITION: Stable  Mikki Alexander 11/01/2023 8:22 AM   This note was created with Dragon Medical transcription system. Any errors in dictation are purely unintentional.

## 2023-11-01 NOTE — Interval H&P Note (Signed)
 History and Physical Interval Note:  11/01/2023 7:23 AM  Amy Wu  has presented today for surgery, with the diagnosis of Perma Cath Exchange    End Stage Renal.  The various methods of treatment have been discussed with the patient and family. After consideration of risks, benefits and other options for treatment, the patient has consented to  Procedure(s): DIALYSIS/PERMA CATHETER INSERTION (N/A) as a surgical intervention.  The patient's history has been reviewed, patient examined, no change in status, stable for surgery.  I have reviewed the patient's chart and labs.  Questions were answered to the patient's satisfaction.     Yossi Hinchman

## 2023-11-22 ENCOUNTER — Encounter (INDEPENDENT_AMBULATORY_CARE_PROVIDER_SITE_OTHER): Payer: Self-pay | Admitting: Vascular Surgery

## 2023-11-22 ENCOUNTER — Ambulatory Visit (INDEPENDENT_AMBULATORY_CARE_PROVIDER_SITE_OTHER): Payer: Medicare (Managed Care) | Admitting: Vascular Surgery

## 2023-11-22 VITALS — BP 148/78 | HR 66 | Resp 6

## 2023-11-22 DIAGNOSIS — I483 Typical atrial flutter: Secondary | ICD-10-CM

## 2023-11-22 DIAGNOSIS — I214 Non-ST elevation (NSTEMI) myocardial infarction: Secondary | ICD-10-CM

## 2023-11-22 DIAGNOSIS — Z992 Dependence on renal dialysis: Secondary | ICD-10-CM

## 2023-11-22 DIAGNOSIS — T829XXA Unspecified complication of cardiac and vascular prosthetic device, implant and graft, initial encounter: Secondary | ICD-10-CM

## 2023-11-22 DIAGNOSIS — N186 End stage renal disease: Secondary | ICD-10-CM

## 2023-11-22 DIAGNOSIS — E1122 Type 2 diabetes mellitus with diabetic chronic kidney disease: Secondary | ICD-10-CM | POA: Diagnosis not present

## 2023-11-22 DIAGNOSIS — Z794 Long term (current) use of insulin: Secondary | ICD-10-CM

## 2023-11-23 ENCOUNTER — Telehealth (INDEPENDENT_AMBULATORY_CARE_PROVIDER_SITE_OTHER): Payer: Self-pay

## 2023-11-23 NOTE — Telephone Encounter (Signed)
 Patient left a message stating that her potassium level is still elevated but has went down from 9 to 8. Patient would like to move forward with was discuss during the office visit.

## 2023-11-25 ENCOUNTER — Encounter (INDEPENDENT_AMBULATORY_CARE_PROVIDER_SITE_OTHER): Payer: Self-pay | Admitting: Vascular Surgery

## 2023-11-25 NOTE — H&P (View-Only) (Signed)
 MRN : [id number]  Amy Wu is a 73 y.o. (06-17-1951) female who presents with chief complaint of check access.  History of Present Illness:    The patient is seen for evaluation of dialysis access.  The patient has a history of multiple failed accesses.  There have been accesses in both arms which are nonfunctioning.    Current access is via a catheter which is functioning poorly the flow rates have been less than ideal.  There have not been multiple episodes of catheter infection.  The patient denies fever and chills while on dialysis.  No tenderness or drainage at the exit site.  No recent shortening of the patient's walking distance or new symptoms consistent with claudication.  No history of rest pain symptoms. No new ulcers or wounds of the lower extremities have occurred.  The patient denies amaurosis fugax or recent TIA symptoms. There are no recent neurological changes noted. There is no history of DVT, PE or superficial thrombophlebitis. No recent episodes of angina or shortness of breath documented.   Current Meds  Medication Sig   acetaminophen  (TYLENOL ) 500 MG tablet Take 1,000 mg by mouth every 8 (eight) hours as needed for moderate pain.   atorvastatin  (LIPITOR) 40 MG tablet Take 40 mg by mouth daily.   calcitRIOL  (ROCALTROL ) 0.25 MCG capsule Take 0.25 mcg by mouth daily. Take 3 capsules by mouth three times week at dialysis   cinacalcet  (SENSIPAR ) 30 MG tablet Take 30 mg by mouth daily with supper.    COMFORT EZ PEN NEEDLES 31G X 6 MM MISC Inject into the skin.   ferrous sulfate  325 (65 FE) MG tablet Take 325 mg by mouth every other day.   fluticasone  (FLONASE ) 50 MCG/ACT nasal spray Place 1 spray into both nostrils daily.   gabapentin  (NEURONTIN ) 100 MG capsule Take 100 mg by mouth 3 (three) times daily as needed. M/W/F after dialysis PRN for leg/thigh cramps   glucose 4 GM chewable tablet Chew 1 tablet by mouth as needed for low blood sugar.  If blood sugar less than 70. Chew 3 tablets and recheck blood sugar in 15 minutes   glucose blood test strip 1 each by Other route as needed for other. Use as instructed   JANUVIA 25 MG tablet Take 25 mg by mouth daily.   lanthanum  (FOSRENOL ) 1000 MG chewable tablet Chew 1,000 mg by mouth 2 (two) times daily with a meal.    loratadine  (CLARITIN ) 10 MG tablet Take 10 mg by mouth daily as needed for allergies.   Multiple Vitamins-Minerals (PRESERVISION AREDS 2) CAPS Take 1 capsule by mouth 2 (two) times daily.   senna-docusate (SENOKOT-S) 8.6-50 MG tablet Take 1-2 tablets by mouth at bedtime as needed for mild constipation or moderate constipation. 1-2 tablets at bedtime if needed   STOOL SOFTENER 100 MG capsule Take 100 mg by mouth 2 (two) times daily.   tiZANidine  (ZANAFLEX ) 4 MG tablet Take 4 mg by mouth 2 (two) times daily as needed for muscle spasms.   TRUEplus Lancets 28G MISC Apply topically.   ULTICARE INSULIN  SYRINGE 31G X 5/16" 0.3 ML MISC SMARTSIG:Injection Daily    Past Medical History:  Diagnosis Date   Arthritis    Chronic kidney disease    Complication of anesthesia    Diabetes mellitus without complication (HCC)    Hyperlipidemia    Peripheral vascular disease (HCC)  Past Surgical History:  Procedure Laterality Date   A/V SHUNT INTERVENTION Left 11/20/2019   Procedure: A/V SHUNT INTERVENTION (Left Thigh);  Surgeon: Celso College, MD;  Location: [facility];  Service: Cardiovascular;  Laterality: Left;   A/V SHUNTOGRAM Left 02/16/2017   Procedure: A/V Shuntogram;  Surgeon: Celso College, MD;  Location: [facility];  Service: Cardiovascular;  Laterality: Left;   A/V SHUNTOGRAM Left 12/13/2017   Procedure: A/V SHUNTOGRAM;  Surgeon: Celso College, MD;  Location: [facility];  Service: Cardiovascular;  Laterality: Left;   A/V SHUNTOGRAM Left 12/05/2018   Procedure: A/V SHUNTOGRAM;  Surgeon: Celso College, MD;  Location: [facility];  Service:  Cardiovascular;  Laterality: Left;   A/V SHUNTOGRAM Left 06/03/2020   Procedure: A/V SHUNTOGRAM;  Surgeon: Celso College, MD;  Location: [facility];  Service: Cardiovascular;  Laterality: Left;   A/V SHUNTOGRAM Left 05/24/2021   Procedure: A/V SHUNTOGRAM;  Surgeon: Jackquelyn Mass, MD;  Location: [facility];  Service: Cardiovascular;  Laterality: Left;  Thigh Graft   ABOVE KNEE LEG AMPUTATION     AV FISTULA PLACEMENT Bilateral    AV FISTULA PLACEMENT Left 07/01/2015   Procedure: INSERTION OF ARTERIOVENOUS (AV) GRAFT THIGH (ARTEGRAFT);  Surgeon: Celso College, MD;  Location: [facility];  Service: Vascular;  Laterality: Left;   BELOW KNEE LEG AMPUTATION Left    BREAST BIOPSY     CATARACT EXTRACTION W/PHACO Left 07/10/2022   Procedure: CATARACT EXTRACTION PHACO AND INTRAOCULAR LENS PLACEMENT (IOC) LEFT DIABETIC  12.98  01:13.7;  Surgeon: Rosa College, MD;  Location: [facility];  Service: Ophthalmology;  Laterality: Left;   DIALYSIS/PERMA CATHETER INSERTION N/A 05/26/2021   Procedure: DIALYSIS/PERMA CATHETER INSERTION;  Surgeon: Celso College, MD;  Location: [facility];  Service: Cardiovascular;  Laterality: N/A;   DIALYSIS/PERMA CATHETER INSERTION N/A 07/05/2022   Procedure: DIALYSIS/PERMA CATHETER INSERTION;  Surgeon: Celso College, MD;  Location: [facility];  Service: Cardiovascular;  Laterality: N/A;   DIALYSIS/PERMA CATHETER INSERTION N/A 07/28/2022   Procedure: DIALYSIS/PERMA CATHETER INSERTION;  Surgeon: Celso College, MD;  Location: [facility];  Service: Cardiovascular;  Laterality: N/A;   DIALYSIS/PERMA CATHETER INSERTION N/A 09/21/2022   Procedure: DIALYSIS/PERMA CATHETER INSERTION;  Surgeon: Celso College, MD;  Location: [facility];  Service: Cardiovascular;  Laterality: N/A;   DIALYSIS/PERMA CATHETER INSERTION N/A 02/15/2023   Procedure: DIALYSIS/PERMA CATHETER INSERTION;  Surgeon: Celso College, MD;  Location: [facility];  Service: Cardiovascular;  Laterality: N/A;   DIALYSIS/PERMA CATHETER INSERTION N/A 04/09/2023   Procedure: DIALYSIS/PERMA CATHETER INSERTION;  Surgeon: Jackquelyn Mass, MD;  Location: [facility];  Service: Cardiovascular;  Laterality: N/A;   DIALYSIS/PERMA CATHETER INSERTION N/A 10/05/2023   Procedure: DIALYSIS/PERMA CATHETER INSERTION;  Surgeon: Celso College, MD;  Location: [facility];  Service: Cardiovascular;  Laterality: N/A;   DIALYSIS/PERMA CATHETER INSERTION N/A 11/01/2023   Procedure: DIALYSIS/PERMA CATHETER INSERTION;  Surgeon: Celso College, MD;  Location: [facility];  Service: Cardiovascular;  Laterality: N/A;   DIALYSIS/PERMA CATHETER REPAIR Right 09/24/2023   Procedure: DIALYSIS/PERMA CATHETER REPAIR;  Surgeon: Jackquelyn Mass, MD;  Location: [facility];  Service: Cardiovascular;  Laterality: Right;   ENDARTERECTOMY FEMORAL Left 07/01/2015   Procedure: SUPERFICIAL FEMORAL ENDARTERECTOMY ;  Surgeon: Celso College, MD;  Location: [facility];  Service: Vascular;  Laterality:  Left;   EXCHANGE OF A DIALYSIS CATHETER  02/11/2015   Procedure: Exchange Of A Dialysis Catheter;  Surgeon: Celso College, MD;  Location: [facility];  Service: Cardiovascular;;   LEFT HEART CATH AND CORONARY ANGIOGRAPHY N/A 10/04/2023   Procedure: LEFT HEART CATH AND CORONARY ANGIOGRAPHY;  Surgeon: Arleen Lacer, MD;  Location: [facility];  Service: Cardiovascular;  Laterality: N/A;   LEG AMPUTATION ABOVE KNEE Right    PERIPHERAL VASCULAR CATHETERIZATION N/A 02/11/2015   Procedure: Dialysis/Perma Catheter Insertion;  Surgeon: Celso College, MD;  Location: [facility];  Service: Cardiovascular;  Laterality: N/A;   PERIPHERAL VASCULAR CATHETERIZATION N/A 05/11/2015   Procedure: Dialysis/Perma Catheter Insertion, exchange;  Surgeon: Jackquelyn Mass, MD;  Location: [facility];  Service: Cardiovascular;  Laterality: N/A;   PERIPHERAL VASCULAR  CATHETERIZATION N/A 05/18/2015   Procedure: Dialysis/Perma Catheter Insertion;  Surgeon: Jackquelyn Mass, MD;  Location: [facility];  Service: Cardiovascular;  Laterality: N/A;   PERIPHERAL VASCULAR CATHETERIZATION Left 07/19/2015   Procedure: A/V Shuntogram/Fistulagram;  Surgeon: Celso College, MD;  Location: [facility];  Service: Cardiovascular;  Laterality: Left;   PERIPHERAL VASCULAR CATHETERIZATION N/A 07/19/2015   Procedure: A/V Shunt Intervention;  Surgeon: Celso College, MD;  Location: [facility];  Service: Cardiovascular;  Laterality: N/A;   PERIPHERAL VASCULAR CATHETERIZATION N/A 08/26/2015   Procedure: Dialysis/Perma Catheter Removal;  Surgeon: Celso College, MD;  Location: [facility];  Service: Cardiovascular;  Laterality: N/A;   PERIPHERAL VASCULAR THROMBECTOMY N/A 12/29/2016   Procedure: Peripheral Vascular Thrombectomy;  Surgeon: Jackquelyn Mass, MD;  Location: [facility];  Service: Cardiovascular;  Laterality: N/A;    Social History Social History   Tobacco Use   Smoking status: Never   Smokeless tobacco: Never  Vaping Use   Vaping status: Never Used  Substance Use Topics   Alcohol use: No   Drug use: No    Family History Family History  Problem Relation Age of Onset   Breast cancer Neg Hx     Allergies  Allergen Reactions   Cefepime  Other (See Comments)    Encephalopathy    Ancef  [Cefazolin ] Palpitations   Contrast Media [Iodinated Contrast Media] Palpitations     REVIEW OF SYSTEMS (Negative unless checked)  Constitutional: [] Weight loss  [] Fever  [] Chills Cardiac: [] Chest pain   [] Chest pressure   [] Palpitations   [] Shortness of breath when laying flat   [] Shortness of breath with exertion. Vascular:  [] Pain in legs with walking   [] Pain in legs at rest  [] History of DVT   [] Phlebitis   [] Swelling in legs   [] Varicose veins   [] Non-healing ulcers Pulmonary:   [] Uses home oxygen   [] Productive cough    [] Hemoptysis   [] Wheeze  [] COPD   [] Asthma Neurologic:  [] Dizziness   [] Seizures   [] History of stroke   [] History of TIA  [] Aphasia   [] Vissual changes   [] Weakness or numbness in arm   [] Weakness or numbness in leg Musculoskeletal:   [] Joint swelling   [] Joint pain   [] Low back pain Hematologic:  [] Easy bruising  [] Easy bleeding   [] Hypercoagulable state   [] Anemic Gastrointestinal:  [] Diarrhea   [] Vomiting  [] Gastroesophageal reflux/heartburn   [] Difficulty swallowing. Genitourinary:  [x] Chronic kidney disease   [] Difficult urination  [] Frequent urination   [] Blood in urine Skin:  [] Rashes   [] Ulcers  Psychological:  [] History of anxiety   []  History  of major depression.  Physical Examination  Vitals:   11/22/23 1321  BP: (!) 148/78  Pulse: 66  Resp: (!) 6   There is no height or weight on file to calculate BMI. Gen: WD/WN, NAD Head: [place]/AT, No temporalis wasting.  Ear/Nose/Throat: Hearing grossly intact, nares w/o erythema or drainage Eyes: PER, EOMI, sclera nonicteric.  Neck: Supple, no gross masses or lesions.  No JVD.  Pulmonary:  Good air movement, no audible wheezing, no use of accessory muscles.  Cardiac: RRR, precordium non-hyperdynamic. Vascular:    Vessel Right Left  Radial Palpable Palpable  Brachial Palpable Palpable  Gastrointestinal: soft, non-distended. No guarding/no peritoneal signs.  Musculoskeletal: M/S 5/5 throughout.  No deformity.  Neurologic: CN 2-12 intact. Pain and light touch intact in extremities.  Symmetrical.  Speech is fluent. Motor exam as listed above. Psychiatric: Judgment intact, Mood & affect appropriate for pt's clinical situation. Dermatologic: No rashes or ulcers noted.  No changes consistent with cellulitis.   CBC Lab Results  Component Value Date   WBC 11.5 (H) 10/12/2023   HGB 8.7 (L) 10/12/2023   HCT 25.3 (L) 10/12/2023   MCV 76.2 (L) 10/12/2023   PLT 228 10/12/2023    BMET    Component Value Date/Time   NA 133 (L)  10/12/2023 0502   NA 138 12/03/2012 1414   K 3.9 10/31/2023 0945   K 4.5 12/03/2012 1414   CL 97 (L) 10/12/2023 0502   CL 101 12/03/2012 1414   CO2 22 10/12/2023 0502   CO2 27 12/03/2012 1414   GLUCOSE 155 (H) 10/12/2023 0502   GLUCOSE 326 (H) 12/03/2012 1414   BUN 33 (H) 10/12/2023 0502   BUN 37 (H) 12/03/2012 1414   CREATININE 6.16 (H) 10/12/2023 0502   CREATININE 7.04 (H) 12/03/2012 1414   CALCIUM  8.5 (L) 10/12/2023 0502   CALCIUM  8.4 (L) 12/03/2012 1414   GFRNONAA 7 (L) 10/12/2023 0502   GFRNONAA 6 (L) 12/03/2012 1414   GFRAA 9 (L) 11/19/2019 1002   GFRAA 7 (L) 12/03/2012 1414   CrCl cannot be calculated (Patient's most recent lab result is older than the maximum 21 days allowed.).  COAG Lab Results  Component Value Date   INR 1.2 10/06/2023   INR 1.2 10/03/2023   INR 1.4 (H) 10/01/2023    Radiology PERIPHERAL VASCULAR CATHETERIZATION Result Date: 11/01/2023 See surgical note for result.    Assessment/Plan 1. Complication associated with dialysis catheter (Primary) Recommend:  The patient is experiencing increasing problems with their catheter based dialysis access.  Patient should have a Tpa infusion to improve catheter flow as well as improve the quality of dialysis therapy.  The risks, benefits and alternative therapies were reviewed in detail with the patient.  All questions were answered.  The patient agrees to proceed with tpa infusion.    The patient will follow up with me in the office after the procedure.   2. ESRD (end stage renal disease) (HCC) See #1  3. Type 2 diabetes mellitus with chronic kidney disease on chronic dialysis, with long-term current use of insulin  (HCC) Continue hypoglycemic medications as already ordered, these medications have been reviewed and there are no changes at this time.  Hgb A1C to be monitored as already arranged by primary service  4. Non-ST elevation (NSTEMI) myocardial infarction [facility]) Continue cardiac and  antihypertensive medications as already ordered and reviewed, no changes at this time.  Continue statin as ordered and reviewed, no changes at this time  Nitrates PRN for chest pain  5. Typical atrial flutter (HCC) Continue antiarrhythmia medications as already ordered, these medications have been reviewed and there are no changes at this time.  Continue anticoagulation as ordered by Cardiology Service    Devon Fogo, MD  11/25/2023 8:09 PM

## 2023-11-25 NOTE — Progress Notes (Signed)
 MRN : [id number]  Amy Wu is a 73 y.o. (06-17-1951) female who presents with chief complaint of check access.  History of Present Illness:    The patient is seen for evaluation of dialysis access.  The patient has a history of multiple failed accesses.  There have been accesses in both arms which are nonfunctioning.    Current access is via a catheter which is functioning poorly the flow rates have been less than ideal.  There have not been multiple episodes of catheter infection.  The patient denies fever and chills while on dialysis.  No tenderness or drainage at the exit site.  No recent shortening of the patient's walking distance or new symptoms consistent with claudication.  No history of rest pain symptoms. No new ulcers or wounds of the lower extremities have occurred.  The patient denies amaurosis fugax or recent TIA symptoms. There are no recent neurological changes noted. There is no history of DVT, PE or superficial thrombophlebitis. No recent episodes of angina or shortness of breath documented.   Current Meds  Medication Sig   acetaminophen  (TYLENOL ) 500 MG tablet Take 1,000 mg by mouth every 8 (eight) hours as needed for moderate pain.   atorvastatin  (LIPITOR) 40 MG tablet Take 40 mg by mouth daily.   calcitRIOL  (ROCALTROL ) 0.25 MCG capsule Take 0.25 mcg by mouth daily. Take 3 capsules by mouth three times week at dialysis   cinacalcet  (SENSIPAR ) 30 MG tablet Take 30 mg by mouth daily with supper.    COMFORT EZ PEN NEEDLES 31G X 6 MM MISC Inject into the skin.   ferrous sulfate  325 (65 FE) MG tablet Take 325 mg by mouth every other day.   fluticasone  (FLONASE ) 50 MCG/ACT nasal spray Place 1 spray into both nostrils daily.   gabapentin  (NEURONTIN ) 100 MG capsule Take 100 mg by mouth 3 (three) times daily as needed. M/W/F after dialysis PRN for leg/thigh cramps   glucose 4 GM chewable tablet Chew 1 tablet by mouth as needed for low blood sugar.  If blood sugar less than 70. Chew 3 tablets and recheck blood sugar in 15 minutes   glucose blood test strip 1 each by Other route as needed for other. Use as instructed   JANUVIA 25 MG tablet Take 25 mg by mouth daily.   lanthanum  (FOSRENOL ) 1000 MG chewable tablet Chew 1,000 mg by mouth 2 (two) times daily with a meal.    loratadine  (CLARITIN ) 10 MG tablet Take 10 mg by mouth daily as needed for allergies.   Multiple Vitamins-Minerals (PRESERVISION AREDS 2) CAPS Take 1 capsule by mouth 2 (two) times daily.   senna-docusate (SENOKOT-S) 8.6-50 MG tablet Take 1-2 tablets by mouth at bedtime as needed for mild constipation or moderate constipation. 1-2 tablets at bedtime if needed   STOOL SOFTENER 100 MG capsule Take 100 mg by mouth 2 (two) times daily.   tiZANidine  (ZANAFLEX ) 4 MG tablet Take 4 mg by mouth 2 (two) times daily as needed for muscle spasms.   TRUEplus Lancets 28G MISC Apply topically.   ULTICARE INSULIN  SYRINGE 31G X 5/16" 0.3 ML MISC SMARTSIG:Injection Daily    Past Medical History:  Diagnosis Date   Arthritis    Chronic kidney disease    Complication of anesthesia    Diabetes mellitus without complication (HCC)    Hyperlipidemia    Peripheral vascular disease (HCC)  Past Surgical History:  Procedure Laterality Date   A/V SHUNT INTERVENTION Left 11/20/2019   Procedure: A/V SHUNT INTERVENTION (Left Thigh);  Surgeon: Celso College, MD;  Location: [facility];  Service: Cardiovascular;  Laterality: Left;   A/V SHUNTOGRAM Left 02/16/2017   Procedure: A/V Shuntogram;  Surgeon: Celso College, MD;  Location: [facility];  Service: Cardiovascular;  Laterality: Left;   A/V SHUNTOGRAM Left 12/13/2017   Procedure: A/V SHUNTOGRAM;  Surgeon: Celso College, MD;  Location: [facility];  Service: Cardiovascular;  Laterality: Left;   A/V SHUNTOGRAM Left 12/05/2018   Procedure: A/V SHUNTOGRAM;  Surgeon: Celso College, MD;  Location: [facility];  Service:  Cardiovascular;  Laterality: Left;   A/V SHUNTOGRAM Left 06/03/2020   Procedure: A/V SHUNTOGRAM;  Surgeon: Celso College, MD;  Location: [facility];  Service: Cardiovascular;  Laterality: Left;   A/V SHUNTOGRAM Left 05/24/2021   Procedure: A/V SHUNTOGRAM;  Surgeon: Jackquelyn Mass, MD;  Location: [facility];  Service: Cardiovascular;  Laterality: Left;  Thigh Graft   ABOVE KNEE LEG AMPUTATION     AV FISTULA PLACEMENT Bilateral    AV FISTULA PLACEMENT Left 07/01/2015   Procedure: INSERTION OF ARTERIOVENOUS (AV) GRAFT THIGH (ARTEGRAFT);  Surgeon: Celso College, MD;  Location: [facility];  Service: Vascular;  Laterality: Left;   BELOW KNEE LEG AMPUTATION Left    BREAST BIOPSY     CATARACT EXTRACTION W/PHACO Left 07/10/2022   Procedure: CATARACT EXTRACTION PHACO AND INTRAOCULAR LENS PLACEMENT (IOC) LEFT DIABETIC  12.98  01:13.7;  Surgeon: Rosa College, MD;  Location: [facility];  Service: Ophthalmology;  Laterality: Left;   DIALYSIS/PERMA CATHETER INSERTION N/A 05/26/2021   Procedure: DIALYSIS/PERMA CATHETER INSERTION;  Surgeon: Celso College, MD;  Location: [facility];  Service: Cardiovascular;  Laterality: N/A;   DIALYSIS/PERMA CATHETER INSERTION N/A 07/05/2022   Procedure: DIALYSIS/PERMA CATHETER INSERTION;  Surgeon: Celso College, MD;  Location: [facility];  Service: Cardiovascular;  Laterality: N/A;   DIALYSIS/PERMA CATHETER INSERTION N/A 07/28/2022   Procedure: DIALYSIS/PERMA CATHETER INSERTION;  Surgeon: Celso College, MD;  Location: [facility];  Service: Cardiovascular;  Laterality: N/A;   DIALYSIS/PERMA CATHETER INSERTION N/A 09/21/2022   Procedure: DIALYSIS/PERMA CATHETER INSERTION;  Surgeon: Celso College, MD;  Location: [facility];  Service: Cardiovascular;  Laterality: N/A;   DIALYSIS/PERMA CATHETER INSERTION N/A 02/15/2023   Procedure: DIALYSIS/PERMA CATHETER INSERTION;  Surgeon: Celso College, MD;  Location: [facility];  Service: Cardiovascular;  Laterality: N/A;   DIALYSIS/PERMA CATHETER INSERTION N/A 04/09/2023   Procedure: DIALYSIS/PERMA CATHETER INSERTION;  Surgeon: Jackquelyn Mass, MD;  Location: [facility];  Service: Cardiovascular;  Laterality: N/A;   DIALYSIS/PERMA CATHETER INSERTION N/A 10/05/2023   Procedure: DIALYSIS/PERMA CATHETER INSERTION;  Surgeon: Celso College, MD;  Location: [facility];  Service: Cardiovascular;  Laterality: N/A;   DIALYSIS/PERMA CATHETER INSERTION N/A 11/01/2023   Procedure: DIALYSIS/PERMA CATHETER INSERTION;  Surgeon: Celso College, MD;  Location: [facility];  Service: Cardiovascular;  Laterality: N/A;   DIALYSIS/PERMA CATHETER REPAIR Right 09/24/2023   Procedure: DIALYSIS/PERMA CATHETER REPAIR;  Surgeon: Jackquelyn Mass, MD;  Location: [facility];  Service: Cardiovascular;  Laterality: Right;   ENDARTERECTOMY FEMORAL Left 07/01/2015   Procedure: SUPERFICIAL FEMORAL ENDARTERECTOMY ;  Surgeon: Celso College, MD;  Location: [facility];  Service: Vascular;  Laterality:  Left;   EXCHANGE OF A DIALYSIS CATHETER  02/11/2015   Procedure: Exchange Of A Dialysis Catheter;  Surgeon: Celso College, MD;  Location: [facility];  Service: Cardiovascular;;   LEFT HEART CATH AND CORONARY ANGIOGRAPHY N/A 10/04/2023   Procedure: LEFT HEART CATH AND CORONARY ANGIOGRAPHY;  Surgeon: Arleen Lacer, MD;  Location: [facility];  Service: Cardiovascular;  Laterality: N/A;   LEG AMPUTATION ABOVE KNEE Right    PERIPHERAL VASCULAR CATHETERIZATION N/A 02/11/2015   Procedure: Dialysis/Perma Catheter Insertion;  Surgeon: Celso College, MD;  Location: [facility];  Service: Cardiovascular;  Laterality: N/A;   PERIPHERAL VASCULAR CATHETERIZATION N/A 05/11/2015   Procedure: Dialysis/Perma Catheter Insertion, exchange;  Surgeon: Jackquelyn Mass, MD;  Location: [facility];  Service: Cardiovascular;  Laterality: N/A;   PERIPHERAL VASCULAR  CATHETERIZATION N/A 05/18/2015   Procedure: Dialysis/Perma Catheter Insertion;  Surgeon: Jackquelyn Mass, MD;  Location: [facility];  Service: Cardiovascular;  Laterality: N/A;   PERIPHERAL VASCULAR CATHETERIZATION Left 07/19/2015   Procedure: A/V Shuntogram/Fistulagram;  Surgeon: Celso College, MD;  Location: [facility];  Service: Cardiovascular;  Laterality: Left;   PERIPHERAL VASCULAR CATHETERIZATION N/A 07/19/2015   Procedure: A/V Shunt Intervention;  Surgeon: Celso College, MD;  Location: [facility];  Service: Cardiovascular;  Laterality: N/A;   PERIPHERAL VASCULAR CATHETERIZATION N/A 08/26/2015   Procedure: Dialysis/Perma Catheter Removal;  Surgeon: Celso College, MD;  Location: [facility];  Service: Cardiovascular;  Laterality: N/A;   PERIPHERAL VASCULAR THROMBECTOMY N/A 12/29/2016   Procedure: Peripheral Vascular Thrombectomy;  Surgeon: Jackquelyn Mass, MD;  Location: [facility];  Service: Cardiovascular;  Laterality: N/A;    Social History Social History   Tobacco Use   Smoking status: Never   Smokeless tobacco: Never  Vaping Use   Vaping status: Never Used  Substance Use Topics   Alcohol use: No   Drug use: No    Family History Family History  Problem Relation Age of Onset   Breast cancer Neg Hx     Allergies  Allergen Reactions   Cefepime  Other (See Comments)    Encephalopathy    Ancef  [Cefazolin ] Palpitations   Contrast Media [Iodinated Contrast Media] Palpitations     REVIEW OF SYSTEMS (Negative unless checked)  Constitutional: [] Weight loss  [] Fever  [] Chills Cardiac: [] Chest pain   [] Chest pressure   [] Palpitations   [] Shortness of breath when laying flat   [] Shortness of breath with exertion. Vascular:  [] Pain in legs with walking   [] Pain in legs at rest  [] History of DVT   [] Phlebitis   [] Swelling in legs   [] Varicose veins   [] Non-healing ulcers Pulmonary:   [] Uses home oxygen   [] Productive cough    [] Hemoptysis   [] Wheeze  [] COPD   [] Asthma Neurologic:  [] Dizziness   [] Seizures   [] History of stroke   [] History of TIA  [] Aphasia   [] Vissual changes   [] Weakness or numbness in arm   [] Weakness or numbness in leg Musculoskeletal:   [] Joint swelling   [] Joint pain   [] Low back pain Hematologic:  [] Easy bruising  [] Easy bleeding   [] Hypercoagulable state   [] Anemic Gastrointestinal:  [] Diarrhea   [] Vomiting  [] Gastroesophageal reflux/heartburn   [] Difficulty swallowing. Genitourinary:  [x] Chronic kidney disease   [] Difficult urination  [] Frequent urination   [] Blood in urine Skin:  [] Rashes   [] Ulcers  Psychological:  [] History of anxiety   []  History  of major depression.  Physical Examination  Vitals:   11/22/23 1321  BP: (!) 148/78  Pulse: 66  Resp: (!) 6   There is no height or weight on file to calculate BMI. Gen: WD/WN, NAD Head: [place]/AT, No temporalis wasting.  Ear/Nose/Throat: Hearing grossly intact, nares w/o erythema or drainage Eyes: PER, EOMI, sclera nonicteric.  Neck: Supple, no gross masses or lesions.  No JVD.  Pulmonary:  Good air movement, no audible wheezing, no use of accessory muscles.  Cardiac: RRR, precordium non-hyperdynamic. Vascular:    Vessel Right Left  Radial Palpable Palpable  Brachial Palpable Palpable  Gastrointestinal: soft, non-distended. No guarding/no peritoneal signs.  Musculoskeletal: M/S 5/5 throughout.  No deformity.  Neurologic: CN 2-12 intact. Pain and light touch intact in extremities.  Symmetrical.  Speech is fluent. Motor exam as listed above. Psychiatric: Judgment intact, Mood & affect appropriate for pt's clinical situation. Dermatologic: No rashes or ulcers noted.  No changes consistent with cellulitis.   CBC Lab Results  Component Value Date   WBC 11.5 (H) 10/12/2023   HGB 8.7 (L) 10/12/2023   HCT 25.3 (L) 10/12/2023   MCV 76.2 (L) 10/12/2023   PLT 228 10/12/2023    BMET    Component Value Date/Time   NA 133 (L)  10/12/2023 0502   NA 138 12/03/2012 1414   K 3.9 10/31/2023 0945   K 4.5 12/03/2012 1414   CL 97 (L) 10/12/2023 0502   CL 101 12/03/2012 1414   CO2 22 10/12/2023 0502   CO2 27 12/03/2012 1414   GLUCOSE 155 (H) 10/12/2023 0502   GLUCOSE 326 (H) 12/03/2012 1414   BUN 33 (H) 10/12/2023 0502   BUN 37 (H) 12/03/2012 1414   CREATININE 6.16 (H) 10/12/2023 0502   CREATININE 7.04 (H) 12/03/2012 1414   CALCIUM  8.5 (L) 10/12/2023 0502   CALCIUM  8.4 (L) 12/03/2012 1414   GFRNONAA 7 (L) 10/12/2023 0502   GFRNONAA 6 (L) 12/03/2012 1414   GFRAA 9 (L) 11/19/2019 1002   GFRAA 7 (L) 12/03/2012 1414   CrCl cannot be calculated (Patient's most recent lab result is older than the maximum 21 days allowed.).  COAG Lab Results  Component Value Date   INR 1.2 10/06/2023   INR 1.2 10/03/2023   INR 1.4 (H) 10/01/2023    Radiology PERIPHERAL VASCULAR CATHETERIZATION Result Date: 11/01/2023 See surgical note for result.    Assessment/Plan 1. Complication associated with dialysis catheter (Primary) Recommend:  The patient is experiencing increasing problems with their catheter based dialysis access.  Patient should have a Tpa infusion to improve catheter flow as well as improve the quality of dialysis therapy.  The risks, benefits and alternative therapies were reviewed in detail with the patient.  All questions were answered.  The patient agrees to proceed with tpa infusion.    The patient will follow up with me in the office after the procedure.   2. ESRD (end stage renal disease) (HCC) See #1  3. Type 2 diabetes mellitus with chronic kidney disease on chronic dialysis, with long-term current use of insulin  (HCC) Continue hypoglycemic medications as already ordered, these medications have been reviewed and there are no changes at this time.  Hgb A1C to be monitored as already arranged by primary service  4. Non-ST elevation (NSTEMI) myocardial infarction [facility]) Continue cardiac and  antihypertensive medications as already ordered and reviewed, no changes at this time.  Continue statin as ordered and reviewed, no changes at this time  Nitrates PRN for chest pain  5. Typical atrial flutter (HCC) Continue antiarrhythmia medications as already ordered, these medications have been reviewed and there are no changes at this time.  Continue anticoagulation as ordered by Cardiology Service    Devon Fogo, MD  11/25/2023 8:09 PM

## 2023-11-26 ENCOUNTER — Telehealth (INDEPENDENT_AMBULATORY_CARE_PROVIDER_SITE_OTHER): Payer: Self-pay

## 2023-11-26 NOTE — Telephone Encounter (Signed)
 Spoke with the patient and Kya at [place] [facility] to get the patient scheduled for a TPA infusion with Dr. Prescilla Brod for 11/27/23 with a 10:30 am arrival time to the [facility]. Pre-procedure instructions were discussed and patient stated she understood and instructions will be sent to [facility] per request.

## 2023-11-27 ENCOUNTER — Other Ambulatory Visit: Payer: Self-pay

## 2023-11-27 ENCOUNTER — Other Ambulatory Visit (INDEPENDENT_AMBULATORY_CARE_PROVIDER_SITE_OTHER): Payer: Self-pay | Admitting: Vascular Surgery

## 2023-11-27 ENCOUNTER — Ambulatory Visit
Admission: RE | Admit: 2023-11-27 | Discharge: 2023-11-27 | Disposition: A | Discharge status: Home / Self Care | Payer: Medicare (Managed Care) | Source: Ambulatory Visit | Attending: Family Medicine | Admitting: Family Medicine

## 2023-11-27 DIAGNOSIS — T8249XA Other complication of vascular dialysis catheter, initial encounter: Secondary | ICD-10-CM

## 2023-11-27 DIAGNOSIS — N186 End stage renal disease: Secondary | ICD-10-CM

## 2023-11-27 DIAGNOSIS — Y831 Surgical operation with implant of artificial internal device as the cause of abnormal reaction of the patient, or of later complication, without mention of misadventure at the time of the procedure: Secondary | ICD-10-CM | POA: Insufficient documentation

## 2023-11-27 DIAGNOSIS — Z992 Dependence on renal dialysis: Secondary | ICD-10-CM | POA: Diagnosis not present

## 2023-11-27 DIAGNOSIS — Z7901 Long term (current) use of anticoagulants: Secondary | ICD-10-CM | POA: Insufficient documentation

## 2023-11-27 DIAGNOSIS — E1122 Type 2 diabetes mellitus with diabetic chronic kidney disease: Secondary | ICD-10-CM | POA: Insufficient documentation

## 2023-11-27 DIAGNOSIS — Z794 Long term (current) use of insulin: Secondary | ICD-10-CM | POA: Insufficient documentation

## 2023-11-27 DIAGNOSIS — I484 Atypical atrial flutter: Secondary | ICD-10-CM | POA: Insufficient documentation

## 2023-11-27 DIAGNOSIS — T829XXA Unspecified complication of cardiac and vascular prosthetic device, implant and graft, initial encounter: Secondary | ICD-10-CM | POA: Insufficient documentation

## 2023-11-27 DIAGNOSIS — I214 Non-ST elevation (NSTEMI) myocardial infarction: Secondary | ICD-10-CM | POA: Diagnosis not present

## 2023-11-27 DIAGNOSIS — Z7984 Long term (current) use of oral hypoglycemic drugs: Secondary | ICD-10-CM | POA: Insufficient documentation

## 2023-11-27 MED ORDER — SODIUM CHLORIDE 0.9 % IV SOLN: 5.0000 mg | Freq: Once | INTRAVENOUS | Status: DC

## 2023-11-27 MED ORDER — ASPIRIN 325 MG PO TBEC: 325.0000 mg | DELAYED_RELEASE_TABLET | Freq: Once | ORAL | Status: DC

## 2023-11-27 MED ORDER — SODIUM CHLORIDE 0.9 % IV SOLN
2.0000 mg | Freq: Once | INTRAVENOUS | Status: AC
  Administered 2023-11-27: 2 mg via INTRAVENOUS
  Filled 2023-11-27: qty 2

## 2023-11-27 MED ORDER — SODIUM CHLORIDE 0.9 % IV SOLN: INTRAVENOUS | Status: DC | PRN

## 2023-11-27 MED ORDER — CLOPIDOGREL BISULFATE 75 MG PO TABS: 300.0000 mg | ORAL_TABLET | Freq: Once | ORAL | Status: DC

## 2023-11-27 NOTE — Op Note (Signed)
    OPERATIVE NOTE   PROCEDURE: Patient for clearing of tunneled dialysis catheter  PRE-OPERATIVE DIAGNOSIS: Complication of tunneled dialysis catheter with poor flow  POST-OPERATIVE DIAGNOSIS: Same  SURGEON: Devon Fogo  ASSISTANT(S): None  ANESTHESIA: None  ESTIMATED BLOOD LOSS: None cc  FINDING(S): After the infusion the catheter is now aspirating flushing quite easily  SPECIMEN(S): None  INDICATIONS:   Amy Wu is a 73 y.o. female who presents with poor function of her tunneled catheter.  She is catheter dependent for life and has very limited options.  She is therefore undergoing a tPA infusion to try to optimize her catheter function.  Risk and benefits of been reviewed all questions answered patient agrees to proceed.  DESCRIPTION: After full informed written consent was obtained from the patient, the patient was brought back to the operating room and placed supine upon the operating table.  Prior to induction, the patient received IV antibiotics.   After obtaining adequate anesthesia, the patient was then prepped in the standard fashion for a tPA infusion.  2 mL aliquots of 1 venous and arterial lumens.  Infusion is performed over 3 hours.  The infusion has been completed I personally aspirated both lumens and then instilled 5000 units of heparin   Both lumens aspirated quite easily and forward flushed without any difficulty whatsoever.  Overall catheter function is markedly improved.   The patient tolerated this procedure well.   COMPLICATIONS: None  CONDITION: [facility]  Office: [phone]   11/27/2023, 4:07 PM

## 2023-11-27 NOTE — Interval H&P Note (Signed)
 History and Physical Interval Note:  11/27/2023 4:07 PM  Amy Wu  has presented today for surgery, with the diagnosis of * No surgery found *.  The various methods of treatment have been discussed with the patient and family. After consideration of risks, benefits and other options for treatment, the patient has consented to  * No surgery found * as a surgical intervention.  The patient's history has been reviewed, patient examined, no change in status, stable for surgery.  I have reviewed the patient's chart and labs.  Questions were answered to the patient's satisfaction.     Devon Fogo

## 2023-12-24 ENCOUNTER — Encounter: Payer: Self-pay | Admitting: *Deleted

## 2023-12-24 NOTE — Progress Notes (Unsigned)
 Cardiology Office Note  Date:  12/25/2023   ID:  Amy Wu, DOB 12-04-1950, MRN [id number]  PCP:  Fleeta Pedro, Jill E, DO   Chief Complaint  Patient presents with   New Patient (Initial Visit)    Ref by [facility] for NSTEMI & A-Flutter.  Doing well. Patient denies chest pain and shortness of breath.     HPI:  Amy Wu is a 73 year old woman with past medical history of Diabetes End-stage renal disease on hemodialysis Atrial flutter in the setting of sepsis April 2025 Coronary artery disease severe mid RCA disease, medical management recommended Who presents by referral from Dr. Jill Vanhorn for atrial fibrillation, non-STEMI  Presented to the hospital October 01, 2023 altered mental status/somnolence, fever, hypoxia Diagnosed with sepsis, respiratory failure started on broad-spectrum antibiotics, hemodialysis Sepsis secondary to HD catheter which was removed October 02, 2023, was weaned off pressors, continued on broad-spectrum antibiotics  Troponin 6000 Cardiac catheterization performed with limited visualization of coronary arteries, very difficult procedure  Cardiology consulted October 07, 2023 new atrial flutter Was started on heparin  infusion, amiodarone  converted to normal sinus rhythm Recommendation for Eliquis  at discharge Aspirin  discontinued  On HD mon/Wed/Fri She feels blood pressure today 160/80 is good, currently not on blood pressure medication Reports blood pressure was very low last year with dialysis  Denies chest pain concerning for angina Activity limited secondary to amputations  Blood in stool this morning On senna Remains on aspirin  Eliquis   EKG personally reviewed by myself on todays visit EKG Interpretation Date/Time:  Tuesday December 25 2023 09:42:14 EDT Ventricular Rate:  58 PR Interval:  298 QRS Duration:  114 QT Interval:  484 QTC Calculation: 475 R Axis:   -49  Text Interpretation: Sinus bradycardia with 1st degree A-V block with  Premature supraventricular complexes Left anterior fascicular block Moderate voltage criteria for LVH, may be normal variant ( R in aVL , Cornell product ) Abnormal QRS-T angle, consider primary T wave abnormality When compared with ECG of 05-Oct-2023 06:41, No significant change was found Confirmed by Perla Lye [phone]) on 12/25/2023 9:46:52 AM     PMH:   has a past medical history of Arthritis, Atypical atrial flutter (HCC), Chronic kidney disease, Complication of anesthesia, Diabetes mellitus without complication (HCC), Hyperlipidemia, NSTEMI (non-ST elevated myocardial infarction) (HCC) (10/2023), and Peripheral vascular disease (HCC).  PSH:    Past Surgical History:  Procedure Laterality Date   A/V SHUNT INTERVENTION Left 11/20/2019   Procedure: A/V SHUNT INTERVENTION (Left Thigh);  Surgeon: Marea Selinda RAMAN, MD;  Location: [facility];  Service: Cardiovascular;  Laterality: Left;   A/V SHUNTOGRAM Left 02/16/2017   Procedure: A/V Shuntogram;  Surgeon: Marea Selinda RAMAN, MD;  Location: [facility];  Service: Cardiovascular;  Laterality: Left;   A/V SHUNTOGRAM Left 12/13/2017   Procedure: A/V SHUNTOGRAM;  Surgeon: Marea Selinda RAMAN, MD;  Location: [facility];  Service: Cardiovascular;  Laterality: Left;   A/V SHUNTOGRAM Left 12/05/2018   Procedure: A/V SHUNTOGRAM;  Surgeon: Marea Selinda RAMAN, MD;  Location: [facility];  Service: Cardiovascular;  Laterality: Left;   A/V SHUNTOGRAM Left 06/03/2020   Procedure: A/V SHUNTOGRAM;  Surgeon: Marea Selinda RAMAN, MD;  Location: [facility];  Service: Cardiovascular;  Laterality: Left;   A/V SHUNTOGRAM Left 05/24/2021   Procedure: A/V SHUNTOGRAM;  Surgeon: Jama Cordella KANDICE, MD;  Location: [facility];  Service: Cardiovascular;  Laterality: Left;  Thigh Graft   ABOVE KNEE LEG  AMPUTATION     AV FISTULA PLACEMENT Bilateral    AV FISTULA PLACEMENT Left 07/01/2015   Procedure: INSERTION OF ARTERIOVENOUS (AV) GRAFT THIGH  (ARTEGRAFT);  Surgeon: Selinda GORMAN Gu, MD;  Location: [facility];  Service: Vascular;  Laterality: Left;   BELOW KNEE LEG AMPUTATION Left    BREAST BIOPSY     CATARACT EXTRACTION W/PHACO Left 07/10/2022   Procedure: CATARACT EXTRACTION PHACO AND INTRAOCULAR LENS PLACEMENT (IOC) LEFT DIABETIC  12.98  01:13.7;  Surgeon: Myrna Adine Anes, MD;  Location: [facility];  Service: Ophthalmology;  Laterality: Left;   DIALYSIS/PERMA CATHETER INSERTION N/A 05/26/2021   Procedure: DIALYSIS/PERMA CATHETER INSERTION;  Surgeon: Gu Selinda GORMAN, MD;  Location: [facility];  Service: Cardiovascular;  Laterality: N/A;   DIALYSIS/PERMA CATHETER INSERTION N/A 07/05/2022   Procedure: DIALYSIS/PERMA CATHETER INSERTION;  Surgeon: Gu Selinda GORMAN, MD;  Location: [facility];  Service: Cardiovascular;  Laterality: N/A;   DIALYSIS/PERMA CATHETER INSERTION N/A 07/28/2022   Procedure: DIALYSIS/PERMA CATHETER INSERTION;  Surgeon: Gu Selinda GORMAN, MD;  Location: [facility];  Service: Cardiovascular;  Laterality: N/A;   DIALYSIS/PERMA CATHETER INSERTION N/A 09/21/2022   Procedure: DIALYSIS/PERMA CATHETER INSERTION;  Surgeon: Gu Selinda GORMAN, MD;  Location: [facility];  Service: Cardiovascular;  Laterality: N/A;   DIALYSIS/PERMA CATHETER INSERTION N/A 02/15/2023   Procedure: DIALYSIS/PERMA CATHETER INSERTION;  Surgeon: Gu Selinda GORMAN, MD;  Location: [facility];  Service: Cardiovascular;  Laterality: N/A;   DIALYSIS/PERMA CATHETER INSERTION N/A 04/09/2023   Procedure: DIALYSIS/PERMA CATHETER INSERTION;  Surgeon: Jama Cordella MATSU, MD;  Location: [facility];  Service: Cardiovascular;  Laterality: N/A;   DIALYSIS/PERMA CATHETER INSERTION N/A 10/05/2023   Procedure: DIALYSIS/PERMA CATHETER INSERTION;  Surgeon: Gu Selinda GORMAN, MD;  Location: [facility];  Service: Cardiovascular;  Laterality: N/A;   DIALYSIS/PERMA CATHETER INSERTION N/A 11/01/2023   Procedure: DIALYSIS/PERMA CATHETER  INSERTION;  Surgeon: Gu Selinda GORMAN, MD;  Location: [facility];  Service: Cardiovascular;  Laterality: N/A;   DIALYSIS/PERMA CATHETER REPAIR Right 09/24/2023   Procedure: DIALYSIS/PERMA CATHETER REPAIR;  Surgeon: Jama Cordella MATSU, MD;  Location: [facility];  Service: Cardiovascular;  Laterality: Right;   ENDARTERECTOMY FEMORAL Left 07/01/2015   Procedure: SUPERFICIAL FEMORAL ENDARTERECTOMY ;  Surgeon: Selinda GORMAN Gu, MD;  Location: [facility];  Service: Vascular;  Laterality: Left;   EXCHANGE OF A DIALYSIS CATHETER  02/11/2015   Procedure: Exchange Of A Dialysis Catheter;  Surgeon: Selinda GORMAN Gu, MD;  Location: [facility];  Service: Cardiovascular;;   LEFT HEART CATH AND CORONARY ANGIOGRAPHY N/A 10/04/2023   Procedure: LEFT HEART CATH AND CORONARY ANGIOGRAPHY;  Surgeon: Anner Alm ORN, MD;  Location: [facility];  Service: Cardiovascular;  Laterality: N/A;   LEG AMPUTATION ABOVE KNEE Right    PERIPHERAL VASCULAR CATHETERIZATION N/A 02/11/2015   Procedure: Dialysis/Perma Catheter Insertion;  Surgeon: Selinda GORMAN Gu, MD;  Location: [facility];  Service: Cardiovascular;  Laterality: N/A;   PERIPHERAL VASCULAR CATHETERIZATION N/A 05/11/2015   Procedure: Dialysis/Perma Catheter Insertion, exchange;  Surgeon: Cordella MATSU Jama, MD;  Location: [facility];  Service: Cardiovascular;  Laterality: N/A;   PERIPHERAL VASCULAR CATHETERIZATION N/A 05/18/2015   Procedure: Dialysis/Perma Catheter Insertion;  Surgeon: Cordella MATSU Jama, MD;  Location: [facility];  Service: Cardiovascular;  Laterality: N/A;   PERIPHERAL VASCULAR CATHETERIZATION Left 07/19/2015   Procedure: A/V Shuntogram/Fistulagram;  Surgeon: Selinda GORMAN Gu, MD;  Location: [facility];  Service: Cardiovascular;  Laterality: Left;   PERIPHERAL VASCULAR CATHETERIZATION N/A 07/19/2015   Procedure: A/V Shunt Intervention;  Surgeon: Selinda GORMAN Gu, MD;  Location: [facility];  Service:  Cardiovascular;  Laterality: N/A;   PERIPHERAL VASCULAR CATHETERIZATION N/A 08/26/2015   Procedure: Dialysis/Perma Catheter Removal;  Surgeon: Selinda GORMAN Gu, MD;  Location: [facility];  Service: Cardiovascular;  Laterality: N/A;   PERIPHERAL VASCULAR THROMBECTOMY N/A 12/29/2016   Procedure: Peripheral Vascular Thrombectomy;  Surgeon: Jama Cordella MATSU, MD;  Location: [facility];  Service: Cardiovascular;  Laterality: N/A;    Current Outpatient Medications  Medication Sig Dispense Refill   acetaminophen  (TYLENOL ) 500 MG tablet Take 1,000 mg by mouth every 8 (eight) hours as needed for moderate pain.     apixaban  (ELIQUIS ) 5 MG TABS tablet Take 1 tablet (5 mg total) by mouth 2 (two) times daily. 60 tablet 0   atorvastatin  (LIPITOR) 40 MG tablet Take 40 mg by mouth daily.     calcitRIOL  (ROCALTROL ) 0.25 MCG capsule Take 0.25 mcg by mouth daily. Take 3 capsules by mouth three times week at dialysis     COMFORT EZ PEN NEEDLES 31G X 6 MM MISC Inject into the skin.     fluticasone  (FLONASE ) 50 MCG/ACT nasal spray Place 1 spray into both nostrils daily.     gabapentin  (NEURONTIN ) 100 MG capsule Take 100 mg by mouth 3 (three) times daily as needed. M/W/F after dialysis PRN for leg/thigh cramps     glucose 4 GM chewable tablet Chew 1 tablet by mouth as needed for low blood sugar. If blood sugar less than 70. Chew 3 tablets and recheck blood sugar in 15 minutes     glucose blood test strip 1 each by Other route as needed for other. Use as instructed     JANUVIA 25 MG tablet Take 25 mg by mouth daily.     loratadine  (CLARITIN ) 10 MG tablet Take 10 mg by mouth daily as needed for allergies.     Multiple Vitamins-Minerals (PRESERVISION AREDS 2) CAPS Take 1 capsule by mouth 2 (two) times daily.     patiromer (VELTASSA) 8.4 g packet Take 8.4 g by mouth daily.     senna-docusate (SENOKOT-S) 8.6-50 MG tablet Take 1-2 tablets by mouth at bedtime as needed for mild constipation or moderate  constipation. 1-2 tablets at bedtime if needed     STOOL SOFTENER 100 MG capsule Take 100 mg by mouth 2 (two) times daily.     tiZANidine  (ZANAFLEX ) 4 MG tablet Take 4 mg by mouth 2 (two) times daily as needed for muscle spasms.     TRUEplus Lancets 28G MISC Apply topically.     ULTICARE INSULIN  SYRINGE 31G X 5/16 0.3 ML MISC SMARTSIG:Injection Daily     amiodarone  (PACERONE ) 200 MG tablet Take 1 tablet (200 mg total) by mouth 2 (two) times daily for 12 days, THEN 1 tablet (200 mg total) daily. (Patient not taking: No sig reported) 54 tablet 0   cinacalcet  (SENSIPAR ) 30 MG tablet Take 30 mg by mouth daily with supper.  (Patient not taking: Reported on 12/25/2023)     ferrous sulfate  325 (65 FE) MG tablet Take 325 mg by mouth every other day. (Patient not taking: Reported on 12/25/2023)     lanthanum  (FOSRENOL ) 1000 MG chewable tablet Chew 1,000 mg by mouth 2 (two) times daily with a meal.      LOKELMA 10 g PACK packet Take 1  packet by mouth as directed. Taken on Sat and Sundays midday (Patient not taking: Reported on 12/25/2023)     No current facility-administered medications for this visit.   Facility-Administered Medications Ordered in Other Visits  Medication Dose Route Frequency Provider Last Rate Last Admin   Heparin  (Porcine) in NaCl 1000-0.9 UT/500ML-% SOLN    PRN Dew, Jason S, MD   500 mL at 02/15/23 1531   heparin  injection    PRN Dew, Jason S, MD   10,000 Units at 02/15/23 1540   lidocaine -EPINEPHrine  (PF) (XYLOCAINE -EPINEPHrine ) 1 %-1:200000 (PF) injection    PRN Dew, Jason S, MD   20 mL at 02/15/23 1530     Allergies:   Cefepime , Ancef  [cefazolin ], and Contrast media [iodinated contrast media]   Social History:  The patient  reports that she has never smoked. She has never used smokeless tobacco. She reports that she does not drink alcohol and does not use drugs.   Family History:   family history includes Diabetes in her brother, father, and mother; Hypertension in her brother,  father, and mother.    Review of Systems: ROS   PHYSICAL EXAM: VS:  BP (!) 160/80 (BP Location: Left Wrist, Patient Position: Sitting)   Pulse (!) 58   Ht 4' 10 (1.473 m)   Wt 214 lb (97.1 kg)   SpO2 96%   BMI 44.73 kg/m  , BMI Body mass index is 44.73 kg/m. GEN: Well nourished, well developed, in no acute distress HEENT: normal Neck: no JVD, carotid bruits, or masses Cardiac: RRR; no murmurs, rubs, or gallops,no edema  Respiratory:  clear to auscultation bilaterally, normal work of breathing GI: soft, nontender, nondistended, + BS MS: no atrophy, leg amputations Skin: warm and dry, no rash Neuro:  Strength and sensation are intact Psych: euthymic mood, full affect  Recent Labs: 10/01/2023: B Natriuretic Peptide 1,436.1 10/02/2023: ALT 16 10/04/2023: Magnesium  2.1 10/10/2023: TSH 3.956 10/12/2023: BUN 33; Creatinine, Ser 6.16; Hemoglobin 8.7; Platelets 228; Sodium 133 10/31/2023: Potassium 3.9    Lipid Panel Lab Results  Component Value Date   CHOL 90 10/03/2023   HDL 16 (L) 10/03/2023   LDLCALC 44 10/03/2023   TRIG 148 10/03/2023      Wt Readings from Last 3 Encounters:  12/25/23 214 lb (97.1 kg)  11/27/23 214 lb (97.1 kg)  11/01/23 214 lb (97.1 kg)       ASSESSMENT AND PLAN:  Problem List Items Addressed This Visit       Cardiology Problems   Typical atrial flutter (HCC) - Primary   Relevant Orders   EKG 12-Lead (Completed)   Hyperlipidemia, unspecified     Other   Type 2 diabetes mellitus (HCC)   Acute respiratory failure with hypoxia (HCC)   Relevant Orders   EKG 12-Lead (Completed)   ESRD (end stage renal disease) (HCC)   Atrial flutter Noted in the hospital April 2025, converting with amiodarone ,  Prescription for amiodarone  ran out and she stopped Recommend she continue low-dose amiodarone  200 daily with Eliquis  5 twice daily Stop aspirin   Coronary artery disease with stable angina Severe RCA disease on catheterization, medical  management recommended, was very difficult heart catheterization Denies anginal symptoms On statin Lipitor 40 daily with Eliquis  in place of aspirin   Essential hypertension Reports blood pressure can drop during dialysis Was rushing today, blood pressure higher Recommend close monitoring of blood pressure before and during dialysis, will defer to nephrology team  Hyperlipidemia Continue Lipitor 40 daily goal LDL less than 70  Signed, Velinda Lunger, M.D., Ph.D. [facility] [place], [place] [phone]

## 2023-12-25 ENCOUNTER — Encounter: Payer: Self-pay | Admitting: Cardiovascular Disease

## 2023-12-25 ENCOUNTER — Ambulatory Visit: Payer: Medicare (Managed Care) | Attending: Cardiovascular Disease | Admitting: Cardiovascular Disease

## 2023-12-25 VITALS — BP 160/80 | HR 58 | Ht <= 58 in | Wt 214.0 lb

## 2023-12-25 DIAGNOSIS — I483 Typical atrial flutter: Secondary | ICD-10-CM | POA: Diagnosis not present

## 2023-12-25 DIAGNOSIS — E1122 Type 2 diabetes mellitus with diabetic chronic kidney disease: Secondary | ICD-10-CM

## 2023-12-25 DIAGNOSIS — Z992 Dependence on renal dialysis: Secondary | ICD-10-CM

## 2023-12-25 DIAGNOSIS — Z794 Long term (current) use of insulin: Secondary | ICD-10-CM

## 2023-12-25 DIAGNOSIS — N186 End stage renal disease: Secondary | ICD-10-CM | POA: Diagnosis not present

## 2023-12-25 DIAGNOSIS — J9601 Acute respiratory failure with hypoxia: Secondary | ICD-10-CM | POA: Diagnosis not present

## 2023-12-25 DIAGNOSIS — E782 Mixed hyperlipidemia: Secondary | ICD-10-CM

## 2023-12-25 MED ORDER — ATORVASTATIN CALCIUM 40 MG PO TABS: 40.0000 mg | ORAL_TABLET | Freq: Every day | ORAL | 3 refills | Status: AC

## 2023-12-25 MED ORDER — AMIODARONE HCL 200 MG PO TABS: 200.0000 mg | ORAL_TABLET | Freq: Every day | ORAL | 2 refills | Status: AC

## 2023-12-25 MED ORDER — APIXABAN 5 MG PO TABS: 5.0000 mg | ORAL_TABLET | Freq: Two times a day (BID) | ORAL | 7 refills | Status: AC

## 2023-12-25 NOTE — Patient Instructions (Addendum)
 Medication Instructions:   Please hold aspirin  Please restart amiodarone  200 mg daily Stay on eliquis  5 mg twice a day  If you need a refill on your cardiac medications before your next appointment, please call your pharmacy.   Lab work: No new labs needed  Testing/Procedures: No new testing needed  Follow-Up: At [facility], you and your health needs are our priority.  As part of our continuing mission to provide you with exceptional heart care, we have created designated Provider Care Teams.  These Care Teams include your primary Cardiologist (physician) and Advanced Practice Providers (APPs -  Physician Assistants and Nurse Practitioners) who all work together to provide you with the care you need, when you need it.  You will need a follow up appointment in 12 months  Providers on your designated Care Team:   Lonni Meager, NP Bernardino Bring, PA-C Cadence [place], [place]  COVID-19 Vaccine Information can be found at: [email or web address] For questions related to vaccine distribution or appointments, please email [email or web address] or call [phone].

## 2024-01-21 ENCOUNTER — Encounter: Payer: Self-pay | Admitting: Emergency Medicine

## 2024-01-21 ENCOUNTER — Encounter
Admission: EM | Disposition: A | Discharge status: Home / Self Care | Payer: Self-pay | Source: Home / Self Care | Attending: Emergency Medicine

## 2024-01-21 ENCOUNTER — Other Ambulatory Visit: Payer: Self-pay

## 2024-01-21 ENCOUNTER — Emergency Department
Admission: EM | Admit: 2024-01-21 | Discharge: 2024-01-21 | Disposition: A | Discharge status: Home / Self Care | Attending: Emergency Medicine | Admitting: Emergency Medicine

## 2024-01-21 DIAGNOSIS — Y828 Other medical devices associated with adverse incidents: Secondary | ICD-10-CM | POA: Diagnosis not present

## 2024-01-21 DIAGNOSIS — N186 End stage renal disease: Secondary | ICD-10-CM

## 2024-01-21 DIAGNOSIS — E1122 Type 2 diabetes mellitus with diabetic chronic kidney disease: Secondary | ICD-10-CM | POA: Insufficient documentation

## 2024-01-21 DIAGNOSIS — Z833 Family history of diabetes mellitus: Secondary | ICD-10-CM | POA: Diagnosis not present

## 2024-01-21 DIAGNOSIS — Z7984 Long term (current) use of oral hypoglycemic drugs: Secondary | ICD-10-CM | POA: Insufficient documentation

## 2024-01-21 DIAGNOSIS — E785 Hyperlipidemia, unspecified: Secondary | ICD-10-CM | POA: Diagnosis not present

## 2024-01-21 DIAGNOSIS — Z7901 Long term (current) use of anticoagulants: Secondary | ICD-10-CM | POA: Insufficient documentation

## 2024-01-21 DIAGNOSIS — Z992 Dependence on renal dialysis: Secondary | ICD-10-CM

## 2024-01-21 DIAGNOSIS — T8249XA Other complication of vascular dialysis catheter, initial encounter: Secondary | ICD-10-CM | POA: Diagnosis not present

## 2024-01-21 DIAGNOSIS — E875 Hyperkalemia: Secondary | ICD-10-CM | POA: Insufficient documentation

## 2024-01-21 DIAGNOSIS — T8242XA Displacement of vascular dialysis catheter, initial encounter: Secondary | ICD-10-CM

## 2024-01-21 DIAGNOSIS — E1151 Type 2 diabetes mellitus with diabetic peripheral angiopathy without gangrene: Secondary | ICD-10-CM | POA: Insufficient documentation

## 2024-01-21 HISTORY — PX: DIALYSIS/PERMA CATHETER REPAIR: CATH118293

## 2024-01-21 LAB — BASIC METABOLIC PANEL WITH GFR
Anion gap: 12 (ref 5–15)
BUN: 38 mg/dL — ABNORMAL HIGH (ref 8–23)
CO2: 21 mmol/L — ABNORMAL LOW (ref 22–32)
Calcium: 9.6 mg/dL (ref 8.9–10.3)
Chloride: 109 mmol/L (ref 98–111)
Creatinine, Ser: 7.49 mg/dL — ABNORMAL HIGH (ref 0.44–1.00)
GFR, Estimated: 5 mL/min — ABNORMAL LOW (ref >60)
Glucose, Bld: 124 mg/dL — ABNORMAL HIGH (ref 70–99)
Potassium: 4 mmol/L (ref 3.5–5.1)
Sodium: 142 mmol/L (ref 135–145)

## 2024-01-21 LAB — MAGNESIUM: Magnesium: 2.6 mg/dL — ABNORMAL HIGH (ref 1.7–2.4)

## 2024-01-21 LAB — PHOSPHORUS: Phosphorus: 3.9 mg/dL (ref 2.5–4.6)

## 2024-01-21 SURGERY — DIALYSIS/PERMA CATHETER REPAIR
Anesthesia: Moderate Sedation | Laterality: Left

## 2024-01-21 MED ORDER — FAMOTIDINE 20 MG PO TABS: 40.0000 mg | ORAL_TABLET | Freq: Once | ORAL | Status: DC | PRN

## 2024-01-21 MED ORDER — HEPARIN SODIUM (PORCINE) 10000 UNIT/ML IJ SOLN
INTRAMUSCULAR | Status: AC
  Filled 2024-01-21: qty 1

## 2024-01-21 MED ORDER — SODIUM CHLORIDE 0.9 % IV SOLN: INTRAVENOUS | Status: DC

## 2024-01-21 MED ORDER — HYDRALAZINE HCL 20 MG/ML IJ SOLN
10.0000 mg | Freq: Once | INTRAMUSCULAR | Status: AC
  Administered 2024-01-21: 10 mg via INTRAVENOUS

## 2024-01-21 MED ORDER — FENTANYL CITRATE (PF) 100 MCG/2ML IJ SOLN
INTRAMUSCULAR | Status: DC | PRN
  Administered 2024-01-21: 50 ug via INTRAVENOUS

## 2024-01-21 MED ORDER — HEPARIN SODIUM (PORCINE) 10000 UNIT/ML IJ SOLN
INTRAMUSCULAR | Status: DC | PRN
Start: 2024-01-21 — End: 2024-01-21
  Administered 2024-01-21: 10000 [IU]

## 2024-01-21 MED ORDER — MIDAZOLAM HCL 2 MG/ML PO SYRP: 8.0000 mg | ORAL_SOLUTION | Freq: Once | ORAL | Status: DC | PRN

## 2024-01-21 MED ORDER — LIDOCAINE-EPINEPHRINE (PF) 1 %-1:200000 IJ SOLN
INTRAMUSCULAR | Status: DC | PRN
  Administered 2024-01-21: 20 mL

## 2024-01-21 MED ORDER — DIPHENHYDRAMINE HCL 50 MG/ML IJ SOLN: 50.0000 mg | Freq: Once | INTRAMUSCULAR | Status: DC | PRN

## 2024-01-21 MED ORDER — HYDRALAZINE HCL 20 MG/ML IJ SOLN
INTRAMUSCULAR | Status: AC
  Filled 2024-01-21: qty 1

## 2024-01-21 MED ORDER — MIDAZOLAM HCL 2 MG/2ML IJ SOLN
INTRAMUSCULAR | Status: DC | PRN
  Administered 2024-01-21: .5 mg via INTRAVENOUS
  Administered 2024-01-21: 1 mg via INTRAVENOUS

## 2024-01-21 MED ORDER — FENTANYL CITRATE PF 50 MCG/ML IJ SOSY
PREFILLED_SYRINGE | INTRAMUSCULAR | Status: AC
  Filled 2024-01-21: qty 1

## 2024-01-21 MED ORDER — METHYLPREDNISOLONE SODIUM SUCC 125 MG IJ SOLR: 125.0000 mg | Freq: Once | INTRAMUSCULAR | Status: DC | PRN

## 2024-01-21 MED ORDER — VANCOMYCIN HCL IN DEXTROSE 1-5 GM/200ML-% IV SOLN
INTRAVENOUS | Status: AC
  Filled 2024-01-21: qty 200

## 2024-01-21 MED ORDER — MIDAZOLAM HCL 2 MG/2ML IJ SOLN
INTRAMUSCULAR | Status: AC
  Filled 2024-01-21: qty 2

## 2024-01-21 MED ORDER — VANCOMYCIN HCL IN DEXTROSE 1-5 GM/200ML-% IV SOLN: 1000.0000 mg | INTRAVENOUS | Status: DC

## 2024-01-21 MED ORDER — HEPARIN (PORCINE) IN NACL 1000-0.9 UT/500ML-% IV SOLN
INTRAVENOUS | Status: DC | PRN
  Administered 2024-01-21: 500 mL

## 2024-01-21 SURGICAL SUPPLY — 6 items
BIOPATCH RED 1 DISK 7.0 (GAUZE/BANDAGES/DRESSINGS) IMPLANT
GUIDEWIRE SUPER STIFF .035X180 (WIRE) IMPLANT
KIT CATH CHRNC PALINDROME 14.5 (CATHETERS) IMPLANT
PACK ANGIOGRAPHY (CUSTOM PROCEDURE TRAY) IMPLANT
SUT MNCRL AB 4-0 PS2 18 (SUTURE) IMPLANT
SUT PROLENE 0 CT 1 30 (SUTURE) IMPLANT

## 2024-01-21 NOTE — H&P (View-Only) (Signed)
 Hospital Consult    Reason for Consult:  Dialysis Access accidental Dislocation  Requesting Physician:  Dr Clotilda Samples MD  MRN #:  [id number]  History of Present Illness: This is a 73 y.o. female Amy Wu is a 73 y.o. female past medical history significant for ESRD on HD MWF with left PermCath to the groin, diabetes, hyperlipidemia, peripheral vascular disease, who presents to the emergency department for concern for dialysis catheter malfunctioning.  Patient is currently on Eliquis .  Went to her routine dialysis today and had bleeding from the left catheter site.  Slightly pulled out and was unable to use her dialysis catheter.  Sent to the emergency department for further evaluation.   Upon exam catheter was noted to be secured higher in he left groin. Based on the suture placement now vs the suture placement in her groin it appears to have been pulled out 5 cm. Patient will need catheter exchange. Vascular Surgery consulted.   Past Medical History:  Diagnosis Date   Arthritis    Atypical atrial flutter (HCC)    Chronic kidney disease    Complication of anesthesia    Diabetes mellitus without complication (HCC)    Hyperlipidemia    NSTEMI (non-ST elevated myocardial infarction) (HCC) 10/2023   Peripheral vascular disease (HCC)     Past Surgical History:  Procedure Laterality Date   A/V SHUNT INTERVENTION Left 11/20/2019   Procedure: A/V SHUNT INTERVENTION (Left Thigh);  Surgeon: Marea Selinda RAMAN, MD;  Location: [facility];  Service: Cardiovascular;  Laterality: Left;   A/V SHUNTOGRAM Left 02/16/2017   Procedure: A/V Shuntogram;  Surgeon: Marea Selinda RAMAN, MD;  Location: [facility];  Service: Cardiovascular;  Laterality: Left;   A/V SHUNTOGRAM Left 12/13/2017   Procedure: A/V SHUNTOGRAM;  Surgeon: Marea Selinda RAMAN, MD;  Location: [facility];  Service: Cardiovascular;  Laterality: Left;   A/V SHUNTOGRAM Left 12/05/2018   Procedure: A/V SHUNTOGRAM;  Surgeon: Marea Selinda RAMAN, MD;  Location: [facility];  Service: Cardiovascular;  Laterality: Left;   A/V SHUNTOGRAM Left 06/03/2020   Procedure: A/V SHUNTOGRAM;  Surgeon: Marea Selinda RAMAN, MD;  Location: [facility];  Service: Cardiovascular;  Laterality: Left;   A/V SHUNTOGRAM Left 05/24/2021   Procedure: A/V SHUNTOGRAM;  Surgeon: Jama Cordella KANDICE, MD;  Location: [facility];  Service: Cardiovascular;  Laterality: Left;  Thigh Graft   ABOVE KNEE LEG AMPUTATION     AV FISTULA PLACEMENT Bilateral    AV FISTULA PLACEMENT Left 07/01/2015   Procedure: INSERTION OF ARTERIOVENOUS (AV) GRAFT THIGH (ARTEGRAFT);  Surgeon: Selinda RAMAN Marea, MD;  Location: [facility];  Service: Vascular;  Laterality: Left;   BELOW KNEE LEG AMPUTATION Left    BREAST BIOPSY     CATARACT EXTRACTION W/PHACO Left 07/10/2022   Procedure: CATARACT EXTRACTION PHACO AND INTRAOCULAR LENS PLACEMENT (IOC) LEFT DIABETIC  12.98  01:13.7;  Surgeon: Myrna Adine Anes, MD;  Location: [facility];  Service: Ophthalmology;  Laterality: Left;   DIALYSIS/PERMA CATHETER INSERTION N/A 05/26/2021   Procedure: DIALYSIS/PERMA CATHETER INSERTION;  Surgeon: Marea Selinda RAMAN, MD;  Location: [facility];  Service: Cardiovascular;  Laterality: N/A;   DIALYSIS/PERMA CATHETER INSERTION N/A 07/05/2022   Procedure: DIALYSIS/PERMA CATHETER INSERTION;  Surgeon: Marea Selinda RAMAN, MD;  Location: [facility];  Service: Cardiovascular;  Laterality: N/A;   DIALYSIS/PERMA CATHETER INSERTION N/A 07/28/2022   Procedure: DIALYSIS/PERMA CATHETER INSERTION;  Surgeon: Marea Selinda RAMAN, MD;  Location: [facility];  Service: Cardiovascular;  Laterality: N/A;   DIALYSIS/PERMA CATHETER INSERTION N/A 09/21/2022   Procedure: DIALYSIS/PERMA CATHETER INSERTION;  Surgeon: Marea Selinda RAMAN, MD;  Location: [facility];  Service: Cardiovascular;  Laterality: N/A;   DIALYSIS/PERMA CATHETER INSERTION N/A 02/15/2023   Procedure: DIALYSIS/PERMA CATHETER INSERTION;   Surgeon: Marea Selinda RAMAN, MD;  Location: [facility];  Service: Cardiovascular;  Laterality: N/A;   DIALYSIS/PERMA CATHETER INSERTION N/A 04/09/2023   Procedure: DIALYSIS/PERMA CATHETER INSERTION;  Surgeon: Jama Cordella MATSU, MD;  Location: [facility];  Service: Cardiovascular;  Laterality: N/A;   DIALYSIS/PERMA CATHETER INSERTION N/A 10/05/2023   Procedure: DIALYSIS/PERMA CATHETER INSERTION;  Surgeon: Marea Selinda RAMAN, MD;  Location: [facility];  Service: Cardiovascular;  Laterality: N/A;   DIALYSIS/PERMA CATHETER INSERTION N/A 11/01/2023   Procedure: DIALYSIS/PERMA CATHETER INSERTION;  Surgeon: Marea Selinda RAMAN, MD;  Location: [facility];  Service: Cardiovascular;  Laterality: N/A;   DIALYSIS/PERMA CATHETER REPAIR Right 09/24/2023   Procedure: DIALYSIS/PERMA CATHETER REPAIR;  Surgeon: Jama Cordella MATSU, MD;  Location: [facility];  Service: Cardiovascular;  Laterality: Right;   ENDARTERECTOMY FEMORAL Left 07/01/2015   Procedure: SUPERFICIAL FEMORAL ENDARTERECTOMY ;  Surgeon: Selinda RAMAN Marea, MD;  Location: [facility];  Service: Vascular;  Laterality: Left;   EXCHANGE OF A DIALYSIS CATHETER  02/11/2015   Procedure: Exchange Of A Dialysis Catheter;  Surgeon: Selinda RAMAN Marea, MD;  Location: [facility];  Service: Cardiovascular;;   LEFT HEART CATH AND CORONARY ANGIOGRAPHY N/A 10/04/2023   Procedure: LEFT HEART CATH AND CORONARY ANGIOGRAPHY;  Surgeon: Anner Alm ORN, MD;  Location: [facility];  Service: Cardiovascular;  Laterality: N/A;   LEG AMPUTATION ABOVE KNEE Right    PERIPHERAL VASCULAR CATHETERIZATION N/A 02/11/2015   Procedure: Dialysis/Perma Catheter Insertion;  Surgeon: Selinda RAMAN Marea, MD;  Location: [facility];  Service: Cardiovascular;  Laterality: N/A;   PERIPHERAL VASCULAR CATHETERIZATION N/A 05/11/2015   Procedure: Dialysis/Perma Catheter Insertion, exchange;  Surgeon: Cordella MATSU Jama, MD;  Location: [facility];  Service:  Cardiovascular;  Laterality: N/A;   PERIPHERAL VASCULAR CATHETERIZATION N/A 05/18/2015   Procedure: Dialysis/Perma Catheter Insertion;  Surgeon: Cordella MATSU Jama, MD;  Location: [facility];  Service: Cardiovascular;  Laterality: N/A;   PERIPHERAL VASCULAR CATHETERIZATION Left 07/19/2015   Procedure: A/V Shuntogram/Fistulagram;  Surgeon: Selinda RAMAN Marea, MD;  Location: [facility];  Service: Cardiovascular;  Laterality: Left;   PERIPHERAL VASCULAR CATHETERIZATION N/A 07/19/2015   Procedure: A/V Shunt Intervention;  Surgeon: Selinda RAMAN Marea, MD;  Location: [facility];  Service: Cardiovascular;  Laterality: N/A;   PERIPHERAL VASCULAR CATHETERIZATION N/A 08/26/2015   Procedure: Dialysis/Perma Catheter Removal;  Surgeon: Selinda RAMAN Marea, MD;  Location: [facility];  Service: Cardiovascular;  Laterality: N/A;   PERIPHERAL VASCULAR THROMBECTOMY N/A 12/29/2016   Procedure: Peripheral Vascular Thrombectomy;  Surgeon: Jama Cordella MATSU, MD;  Location: [facility];  Service: Cardiovascular;  Laterality: N/A;    Allergies  Allergen Reactions   Cefepime  Other (See Comments)    Encephalopathy    Ancef  [Cefazolin ] Palpitations   Contrast Media [Iodinated Contrast Media] Palpitations    Prior to Admission medications   Medication Sig Start Date End Date Taking? Authorizing Provider  acetaminophen  (TYLENOL ) 500 MG tablet Take 1,000 mg by mouth every 8 (eight) hours as needed for moderate pain.    [provider]  amiodarone  (PACERONE )  200 MG tablet Take 1 tablet (200 mg total) by mouth daily. 12/25/23   Gollan, Timothy J, MD  apixaban  (ELIQUIS ) 5 MG TABS tablet Take 1 tablet (5 mg total) by mouth 2 (two) times daily. 12/25/23   Gollan, Timothy J, MD  atorvastatin  (LIPITOR) 40 MG tablet Take 1 tablet (40 mg total) by mouth daily. 12/25/23   Gollan, Timothy J, MD  calcitRIOL  (ROCALTROL ) 0.25 MCG capsule Take 0.25 mcg by mouth daily. Take 3 capsules by mouth three times  week at dialysis    [provider]  cinacalcet  (SENSIPAR ) 30 MG tablet Take 30 mg by mouth daily with supper.  Patient not taking: Reported on 12/25/2023    [provider]  COMFORT EZ PEN NEEDLES 31G X 6 MM MISC Inject into the skin. 03/17/21   [provider]  ferrous sulfate  325 (65 FE) MG tablet Take 325 mg by mouth every other day. Patient not taking: Reported on 12/25/2023    [provider]  fluticasone  (FLONASE ) 50 MCG/ACT nasal spray Place 1 spray into both nostrils daily.    [provider]  gabapentin  (NEURONTIN ) 100 MG capsule Take 100 mg by mouth 3 (three) times daily as needed. M/W/F after dialysis PRN for leg/thigh cramps    [provider]  glucose 4 GM chewable tablet Chew 1 tablet by mouth as needed for low blood sugar. If blood sugar less than 70. Chew 3 tablets and recheck blood sugar in 15 minutes    [provider]  glucose blood test strip 1 each by Other route as needed for other. Use as instructed    [provider]  JANUVIA 25 MG tablet Take 25 mg by mouth daily. 11/02/20   [provider]  lanthanum  (FOSRENOL ) 1000 MG chewable tablet Chew 1,000 mg by mouth 2 (two) times daily with a meal.     [provider]  LOKELMA 10 g PACK packet Take 1 packet by mouth as directed. Taken on Sat and Sundays midday Patient not taking: Reported on 12/25/2023 09/25/23   [provider]  loratadine  (CLARITIN ) 10 MG tablet Take 10 mg by mouth daily as needed for allergies.    [provider]  Multiple Vitamins-Minerals (PRESERVISION AREDS 2) CAPS Take 1 capsule by mouth 2 (two) times daily. 09/18/23   [provider]  patiromer (VELTASSA) 8.4 g packet Take 8.4 g by mouth daily.    [provider]  senna-docusate (SENOKOT-S) 8.6-50 MG tablet Take 1-2 tablets by mouth at bedtime as needed for mild constipation or moderate constipation. 1-2 tablets at bedtime if needed    [provider]  STOOL SOFTENER 100 MG capsule Take 100 mg by mouth 2 (two) times daily. 09/18/23   [provider]  tiZANidine  (ZANAFLEX ) 4 MG tablet Take 4 mg by mouth 2 (two) times daily as needed for muscle spasms. 09/25/23   [provider]  TRUEplus Lancets 28G MISC Apply topically. 03/17/21   [provider]  DANYA INSULIN  SYRINGE 31G X 5/16 0.3 ML MISC SMARTSIG:Injection Daily 03/31/21   [provider]    Social History   Socioeconomic History   Marital status: Single    Spouse name: Not on file   Number of children: Not on file   Years of education: Not on file   Highest education level: Not on file  Occupational History   Not on file  Tobacco Use   Smoking status: Never   Smokeless tobacco: Never  Vaping  Use   Vaping status: Never Used  Substance and Sexual Activity   Alcohol use: No   Drug use: No   Sexual activity: Not on file  Other Topics Concern   Not on file  Social History Narrative   [name] Dickinson, friend, lives with and have been together 33 years.   Social Drivers of Corporate investment banker Strain: Not on file  Food Insecurity: No Food Insecurity (10/01/2023)   Hunger Vital Sign    Worried About Running Out of Food in the Last Year: Never true    Ran Out of Food in the Last Year: Never true  Transportation Needs: No Transportation Needs (10/01/2023)   PRAPARE - Administrator, Civil Service (Medical): No    Lack of Transportation (Non-Medical): No  Physical Activity: Not on file  Stress: Not on file  Social Connections: Moderately Isolated (10/01/2023)   Social Connection and Isolation Panel    Frequency of Communication with Friends and Family: More than three times a week    Frequency of Social Gatherings with Friends and Family: More than three times a week    Attends Religious Services: Never    Database administrator or Organizations: No    Attends Banker Meetings: Never     Marital Status: Living with partner  Intimate Partner Violence: Not At Risk (10/01/2023)   Humiliation, Afraid, Rape, and Kick questionnaire    Fear of Current or Ex-Partner: No    Emotionally Abused: No    Physically Abused: No    Sexually Abused: No     Family History  Problem Relation Age of Onset   Hypertension Mother    Diabetes Mother    Hypertension Father    Diabetes Father    Diabetes Brother    Hypertension Brother    Breast cancer Neg Hx     ROS: Otherwise negative unless mentioned in HPI  Physical Examination  Vitals:   01/21/24 1049  BP: (!) 133/106  Pulse: 68  Resp: 18  Temp: 98.2 F (36.8 C)  SpO2: 91%   Body mass index is 43.96 kg/m.  General:  WDWN in NAD Gait: Not observed HENT: WNL, normocephalic Pulmonary: normal non-labored breathing, without Rales, rhonchi,  wheezing Cardiac: regular, without  Murmurs, rubs or gallops; without carotid bruits Abdomen: Positive bowel sounds, soft, NT/ND, no masses Skin: without rashes Vascular Exam/Pulses: Bilateral lower extremity AKA's. Palpable bilateral radial pulses  Extremities: without ischemic changes, without Gangrene , without cellulitis; without open wounds;  Musculoskeletal: no muscle wasting or atrophy  Neurologic: A&O X 3;  No focal weakness or paresthesias are detected; speech is fluent/normal Psychiatric:  The pt has Normal affect. Lymph:  Unremarkable  CBC    Component Value Date/Time   WBC 11.5 (H) 10/12/2023 1417   RBC 3.32 (L) 10/12/2023 1417   HGB 8.7 (L) 10/12/2023 1417   HGB 12.4 12/03/2012 1414   HCT 25.3 (L) 10/12/2023 1417   HCT 38.0 12/03/2012 1414   PLT 228 10/12/2023 1417   PLT 193 12/03/2012 1414   MCV 76.2 (L) 10/12/2023 1417   MCV 81 12/03/2012 1414   MCH 26.2 10/12/2023 1417   MCHC 34.4 10/12/2023 1417   RDW 17.7 (H) 10/12/2023 1417   RDW 17.7 (H) 12/03/2012 1414   LYMPHSABS 0.7 10/01/2023 0912   MONOABS 0.8 10/01/2023 0912   EOSABS 0.0 10/01/2023 0912    BASOSABS 0.0 10/01/2023 0912    BMET    Component Value Date/Time  NA 133 (L) 10/12/2023 0502   NA 138 12/03/2012 1414   K 3.9 10/31/2023 0945   K 4.5 12/03/2012 1414   CL 97 (L) 10/12/2023 0502   CL 101 12/03/2012 1414   CO2 22 10/12/2023 0502   CO2 27 12/03/2012 1414   GLUCOSE 155 (H) 10/12/2023 0502   GLUCOSE 326 (H) 12/03/2012 1414   BUN 33 (H) 10/12/2023 0502   BUN 37 (H) 12/03/2012 1414   CREATININE 6.16 (H) 10/12/2023 0502   CREATININE 7.04 (H) 12/03/2012 1414   CALCIUM  8.5 (L) 10/12/2023 0502   CALCIUM  8.4 (L) 12/03/2012 1414   GFRNONAA 7 (L) 10/12/2023 0502   GFRNONAA 6 (L) 12/03/2012 1414   GFRAA 9 (L) 11/19/2019 1002   GFRAA 7 (L) 12/03/2012 1414    COAGS: Lab Results  Component Value Date   INR 1.2 10/06/2023   INR 1.2 10/03/2023   INR 1.4 (H) 10/01/2023     Non-Invasive Vascular Imaging:   None   Statin:  Yes.   Beta Blocker:  No. Aspirin :  No. ACEI:  No. ARB:  No. CCB use:  No Other antiplatelets/anticoagulants:  Yes.   Eliquis  5 mg twice daily   ASSESSMENT/PLAN: This is a 73 y.o. female who presented to her outpatient dialysis today with a catheter that has migrated out after a broken suture holding it in place in her left groin.   Vascular surgery plans on taking the patient to the vascular lab for a dialysis perma catheter exchange. I had a long detailed discussion with the patient  at the bedside in the emergency room concerning the procedure, benefits, risks and complications. Patient verbalized her understanding and wishes to proceed. She endorses she ate breakfast this morning.     -I discussed the case in detail with Dr Selinda Gu MD and he agrees with the plan.    Gwendlyn JONELLE Shank [facility] 01/21/2024 11:53 AM

## 2024-01-21 NOTE — Op Note (Signed)
 OPERATIVE NOTE    PRE-OPERATIVE DIAGNOSIS: 1. ESRD 2. Non-functional permcath  POST-OPERATIVE DIAGNOSIS: same as above  PROCEDURE: Fluoroscopic guidance for placement of catheter Placement of a 44 cm tip to cuff tunneled hemodialysis catheter via the left femoral vein and removal of previous catheter  SURGEON: Selinda Gu, MD  ANESTHESIA:  Local with moderate conscious sedation for 11 minutes using 1.5 mg of Versed  and 50 mcg of Fentanyl   ESTIMATED BLOOD LOSS: 10 cc  FINDING(S): none  SPECIMEN(S):  None  INDICATIONS:   Patient is a 73 y.o.female who presents with non-functional dialysis catheter and ESRD.  The patient needs long term dialysis access for their ESRD, and a Permcath is necessary.  Risks and benefits are discussed and informed consent is obtained.    DESCRIPTION: After obtaining full informed written consent, the patient was brought back to the vascular suite. The patient received moderate conscious sedation during a face-to-face encounter with me present throughout the entire procedure and supervising the RN monitoring the vital signs, pulse oximetry, telemetry, and mental status throughout the entire procedure. The patient's existing catheter, left groin and thigh were sterilely prepped and draped in a sterile surgical field was created.  The existing catheter was dissected free from the fibrous sheath securing the cuff with hemostats and blunt dissection.  A wire was placed. The existing catheter was then removed and the wire used to keep venous access. I selected a 44 cm tip to cuff tunneled dialysis catheter.  Using fluoroscopic guidance the catheter tips were parked in the right atrium. The appropriate distal connectors were placed. It withdrew blood well and flushed easily with heparinized saline and a concentrated heparin  solution was then placed. It was secured to the thigh with 2 Prolene sutures. A 4-0 Monocryl pursestring suture was placed around the exit site.  Sterile dressings were placed. The patient tolerated the procedure well and was taken to the recovery room in stable condition.  COMPLICATIONS: None  CONDITION: Stable  Selinda Gu 01/21/2024 4:30 PM   This note was created with Dragon Medical transcription system. Any errors in dictation are purely unintentional.

## 2024-01-21 NOTE — Discharge Instructions (Signed)
 Go to your make up dialysis session tomorrow.  Continue to take your home medications including your Lokelma and your medications for elevated potassium level.

## 2024-01-21 NOTE — Interval H&P Note (Signed)
 History and Physical Interval Note:  01/21/2024 3:54 PM  Amy Wu  has presented today for surgery, with the diagnosis of ESRD.  The various methods of treatment have been discussed with the patient and family. After consideration of risks, benefits and other options for treatment, the patient has consented to  Procedure(s): DIALYSIS/PERMA CATHETER REPAIR (Left) as a surgical intervention.  The patient's history has been reviewed, patient examined, no change in status, stable for surgery.  I have reviewed the patient's chart and labs.  Questions were answered to the patient's satisfaction.     [name]

## 2024-01-21 NOTE — ED Provider Notes (Signed)
 [facility] Provider Note    Event Date/Time   First MD Initiated Contact with Patient 01/21/24 1053     (approximate)   History   Vascular Access Problem   HPI  Amy Wu is a 73 y.o. female past medical history significant for ESRD on HD MWF with left PermCath to the groin, diabetes, hyperlipidemia, peripheral vascular disease, who presents to the emergency department for concern for dialysis catheter malfunctioning.  Patient is currently on Eliquis .  Went to her routine dialysis today and had bleeding from the left catheter site.  Slightly pulled out and was unable to use her dialysis catheter.  Sent to the emergency department for further evaluation.     Physical Exam   Triage Vital Signs: ED Triage Vitals  Encounter Vitals Group     BP 01/21/24 1049 (!) 133/106     Girls Systolic BP Percentile --      Girls Diastolic BP Percentile --      Boys Systolic BP Percentile --      Boys Diastolic BP Percentile --      Pulse Rate 01/21/24 1049 68     Resp 01/21/24 1049 18     Temp 01/21/24 1049 98.2 F (36.8 C)     Temp Source 01/21/24 1049 Oral     SpO2 01/21/24 1049 91 %     Weight 01/21/24 1050 214 lb (97.1 kg)     Height 01/21/24 1050 4' 10.5 (1.486 m)     Head Circumference --      Peak Flow --      Pain Score 01/21/24 1050 0     Pain Loc --      Pain Education --      Exclude from Growth Chart --     Most recent vital signs: Vitals:   01/21/24 1049  BP: (!) 133/106  Pulse: 68  Resp: 18  Temp: 98.2 F (36.8 C)  SpO2: 91%    Physical Exam Constitutional:      Appearance: She is well-developed.     Comments: Sitting in a wheelchair  HENT:     Head: Atraumatic.  Eyes:     Conjunctiva/sclera: Conjunctivae normal.  Cardiovascular:     Rate and Rhythm: Regular rhythm.  Pulmonary:     Effort: No respiratory distress.  Abdominal:     General: There is no distension.  Musculoskeletal:        General: Normal range of motion.      Cervical back: Normal range of motion.     Comments: Bilateral AKA.  PermCath to the left groin site with no active bleeding  Skin:    General: Skin is warm.  Neurological:     Mental Status: She is alert. Mental status is at baseline.     IMPRESSION / MDM / ASSESSMENT AND PLAN / ED COURSE  I reviewed the triage vital signs and the nursing notes.  Differential diagnosis including nonfunctioning PermCath, hyperkalemia, electrolyte abnormality   LABS (all labs ordered are listed, but only abnormal results are displayed) Labs interpreted as -    Labs Reviewed  BASIC METABOLIC PANEL WITH GFR - Abnormal; Notable for the following components:      Result Value   CO2 21 (*)    Glucose, Bld 124 (*)    BUN 38 (*)    Creatinine, Ser 7.49 (*)    GFR, Estimated 5 (*)    All other components within normal limits  MAGNESIUM  -  Abnormal; Notable for the following components:   Magnesium  2.6 (*)    All other components within normal limits  PHOSPHORUS  CBC     MDM  Patient does take Lokelma for hyperkalemia.  Discussed the patient's case with nephrology who wanted vascular surgery to come evaluate her catheter.  Vascular surgery evaluated the patient in the emergency department with plan to have an exchange of her PermCath.  No significant hyperkalemia or need for emergent dialysis at this time.  Discussed with nephrology who will arrange make up dialysis session tomorrow.  Discussed this with the patient states that she would be able to go tomorrow.  Plan to discharge the patient after her catheter is exchanged.  Discussed continuing to take her home medications including her Lokelma.      PROCEDURES:  Critical Care performed: No  Procedures  Patient's presentation is most consistent with acute presentation with potential threat to life or bodily function.   MEDICATIONS ORDERED IN ED: Medications - No data to display  FINAL CLINICAL IMPRESSION(S) / ED DIAGNOSES   Final  diagnoses:  Displacement of vascular dialysis catheter, initial encounter [facility])     Rx / DC Orders   ED Discharge Orders     None        Note:  This document was prepared using Dragon voice recognition software and may include unintentional dictation errors.   Suzanne Kirsch, MD 01/21/24 1253

## 2024-01-21 NOTE — ED Triage Notes (Signed)
 Patient to ED via POV for dialysis catheter problem. PT states it was bleeding- not currently and has come out of place. Unable to use for dialysis today.

## 2024-01-21 NOTE — Consult Note (Signed)
 Hospital Consult    Reason for Consult:  Dialysis Access accidental Dislocation  Requesting Physician:  Dr Clotilda Samples MD  MRN #:  [id number]  History of Present Illness: This is a 73 y.o. female Amy Wu is a 73 y.o. female past medical history significant for ESRD on HD MWF with left PermCath to the groin, diabetes, hyperlipidemia, peripheral vascular disease, who presents to the emergency department for concern for dialysis catheter malfunctioning.  Patient is currently on Eliquis .  Went to her routine dialysis today and had bleeding from the left catheter site.  Slightly pulled out and was unable to use her dialysis catheter.  Sent to the emergency department for further evaluation.   Upon exam catheter was noted to be secured higher in he left groin. Based on the suture placement now vs the suture placement in her groin it appears to have been pulled out 5 cm. Patient will need catheter exchange. Vascular Surgery consulted.   Past Medical History:  Diagnosis Date   Arthritis    Atypical atrial flutter (HCC)    Chronic kidney disease    Complication of anesthesia    Diabetes mellitus without complication (HCC)    Hyperlipidemia    NSTEMI (non-ST elevated myocardial infarction) (HCC) 10/2023   Peripheral vascular disease (HCC)     Past Surgical History:  Procedure Laterality Date   A/V SHUNT INTERVENTION Left 11/20/2019   Procedure: A/V SHUNT INTERVENTION (Left Thigh);  Surgeon: Marea Selinda RAMAN, MD;  Location: [facility];  Service: Cardiovascular;  Laterality: Left;   A/V SHUNTOGRAM Left 02/16/2017   Procedure: A/V Shuntogram;  Surgeon: Marea Selinda RAMAN, MD;  Location: [facility];  Service: Cardiovascular;  Laterality: Left;   A/V SHUNTOGRAM Left 12/13/2017   Procedure: A/V SHUNTOGRAM;  Surgeon: Marea Selinda RAMAN, MD;  Location: [facility];  Service: Cardiovascular;  Laterality: Left;   A/V SHUNTOGRAM Left 12/05/2018   Procedure: A/V SHUNTOGRAM;  Surgeon: Marea Selinda RAMAN, MD;  Location: [facility];  Service: Cardiovascular;  Laterality: Left;   A/V SHUNTOGRAM Left 06/03/2020   Procedure: A/V SHUNTOGRAM;  Surgeon: Marea Selinda RAMAN, MD;  Location: [facility];  Service: Cardiovascular;  Laterality: Left;   A/V SHUNTOGRAM Left 05/24/2021   Procedure: A/V SHUNTOGRAM;  Surgeon: Jama Cordella KANDICE, MD;  Location: [facility];  Service: Cardiovascular;  Laterality: Left;  Thigh Graft   ABOVE KNEE LEG AMPUTATION     AV FISTULA PLACEMENT Bilateral    AV FISTULA PLACEMENT Left 07/01/2015   Procedure: INSERTION OF ARTERIOVENOUS (AV) GRAFT THIGH (ARTEGRAFT);  Surgeon: Selinda RAMAN Marea, MD;  Location: [facility];  Service: Vascular;  Laterality: Left;   BELOW KNEE LEG AMPUTATION Left    BREAST BIOPSY     CATARACT EXTRACTION W/PHACO Left 07/10/2022   Procedure: CATARACT EXTRACTION PHACO AND INTRAOCULAR LENS PLACEMENT (IOC) LEFT DIABETIC  12.98  01:13.7;  Surgeon: Myrna Adine Anes, MD;  Location: [facility];  Service: Ophthalmology;  Laterality: Left;   DIALYSIS/PERMA CATHETER INSERTION N/A 05/26/2021   Procedure: DIALYSIS/PERMA CATHETER INSERTION;  Surgeon: Marea Selinda RAMAN, MD;  Location: [facility];  Service: Cardiovascular;  Laterality: N/A;   DIALYSIS/PERMA CATHETER INSERTION N/A 07/05/2022   Procedure: DIALYSIS/PERMA CATHETER INSERTION;  Surgeon: Marea Selinda RAMAN, MD;  Location: [facility];  Service: Cardiovascular;  Laterality: N/A;   DIALYSIS/PERMA CATHETER INSERTION N/A 07/28/2022   Procedure: DIALYSIS/PERMA CATHETER INSERTION;  Surgeon: Marea Selinda RAMAN, MD;  Location: [facility];  Service: Cardiovascular;  Laterality: N/A;   DIALYSIS/PERMA CATHETER INSERTION N/A 09/21/2022   Procedure: DIALYSIS/PERMA CATHETER INSERTION;  Surgeon: Marea Selinda RAMAN, MD;  Location: [facility];  Service: Cardiovascular;  Laterality: N/A;   DIALYSIS/PERMA CATHETER INSERTION N/A 02/15/2023   Procedure: DIALYSIS/PERMA CATHETER INSERTION;   Surgeon: Marea Selinda RAMAN, MD;  Location: [facility];  Service: Cardiovascular;  Laterality: N/A;   DIALYSIS/PERMA CATHETER INSERTION N/A 04/09/2023   Procedure: DIALYSIS/PERMA CATHETER INSERTION;  Surgeon: Jama Cordella MATSU, MD;  Location: [facility];  Service: Cardiovascular;  Laterality: N/A;   DIALYSIS/PERMA CATHETER INSERTION N/A 10/05/2023   Procedure: DIALYSIS/PERMA CATHETER INSERTION;  Surgeon: Marea Selinda RAMAN, MD;  Location: [facility];  Service: Cardiovascular;  Laterality: N/A;   DIALYSIS/PERMA CATHETER INSERTION N/A 11/01/2023   Procedure: DIALYSIS/PERMA CATHETER INSERTION;  Surgeon: Marea Selinda RAMAN, MD;  Location: [facility];  Service: Cardiovascular;  Laterality: N/A;   DIALYSIS/PERMA CATHETER REPAIR Right 09/24/2023   Procedure: DIALYSIS/PERMA CATHETER REPAIR;  Surgeon: Jama Cordella MATSU, MD;  Location: [facility];  Service: Cardiovascular;  Laterality: Right;   ENDARTERECTOMY FEMORAL Left 07/01/2015   Procedure: SUPERFICIAL FEMORAL ENDARTERECTOMY ;  Surgeon: Selinda RAMAN Marea, MD;  Location: [facility];  Service: Vascular;  Laterality: Left;   EXCHANGE OF A DIALYSIS CATHETER  02/11/2015   Procedure: Exchange Of A Dialysis Catheter;  Surgeon: Selinda RAMAN Marea, MD;  Location: [facility];  Service: Cardiovascular;;   LEFT HEART CATH AND CORONARY ANGIOGRAPHY N/A 10/04/2023   Procedure: LEFT HEART CATH AND CORONARY ANGIOGRAPHY;  Surgeon: Anner Alm ORN, MD;  Location: [facility];  Service: Cardiovascular;  Laterality: N/A;   LEG AMPUTATION ABOVE KNEE Right    PERIPHERAL VASCULAR CATHETERIZATION N/A 02/11/2015   Procedure: Dialysis/Perma Catheter Insertion;  Surgeon: Selinda RAMAN Marea, MD;  Location: [facility];  Service: Cardiovascular;  Laterality: N/A;   PERIPHERAL VASCULAR CATHETERIZATION N/A 05/11/2015   Procedure: Dialysis/Perma Catheter Insertion, exchange;  Surgeon: Cordella MATSU Jama, MD;  Location: [facility];  Service:  Cardiovascular;  Laterality: N/A;   PERIPHERAL VASCULAR CATHETERIZATION N/A 05/18/2015   Procedure: Dialysis/Perma Catheter Insertion;  Surgeon: Cordella MATSU Jama, MD;  Location: [facility];  Service: Cardiovascular;  Laterality: N/A;   PERIPHERAL VASCULAR CATHETERIZATION Left 07/19/2015   Procedure: A/V Shuntogram/Fistulagram;  Surgeon: Selinda RAMAN Marea, MD;  Location: [facility];  Service: Cardiovascular;  Laterality: Left;   PERIPHERAL VASCULAR CATHETERIZATION N/A 07/19/2015   Procedure: A/V Shunt Intervention;  Surgeon: Selinda RAMAN Marea, MD;  Location: [facility];  Service: Cardiovascular;  Laterality: N/A;   PERIPHERAL VASCULAR CATHETERIZATION N/A 08/26/2015   Procedure: Dialysis/Perma Catheter Removal;  Surgeon: Selinda RAMAN Marea, MD;  Location: [facility];  Service: Cardiovascular;  Laterality: N/A;   PERIPHERAL VASCULAR THROMBECTOMY N/A 12/29/2016   Procedure: Peripheral Vascular Thrombectomy;  Surgeon: Jama Cordella MATSU, MD;  Location: [facility];  Service: Cardiovascular;  Laterality: N/A;    Allergies  Allergen Reactions   Cefepime  Other (See Comments)    Encephalopathy    Ancef  [Cefazolin ] Palpitations   Contrast Media [Iodinated Contrast Media] Palpitations    Prior to Admission medications   Medication Sig Start Date End Date Taking? Authorizing Provider  acetaminophen  (TYLENOL ) 500 MG tablet Take 1,000 mg by mouth every 8 (eight) hours as needed for moderate pain.    [provider]  amiodarone  (PACERONE )  200 MG tablet Take 1 tablet (200 mg total) by mouth daily. 12/25/23   Gollan, Timothy J, MD  apixaban  (ELIQUIS ) 5 MG TABS tablet Take 1 tablet (5 mg total) by mouth 2 (two) times daily. 12/25/23   Gollan, Timothy J, MD  atorvastatin  (LIPITOR) 40 MG tablet Take 1 tablet (40 mg total) by mouth daily. 12/25/23   Gollan, Timothy J, MD  calcitRIOL  (ROCALTROL ) 0.25 MCG capsule Take 0.25 mcg by mouth daily. Take 3 capsules by mouth three times  week at dialysis    [provider]  cinacalcet  (SENSIPAR ) 30 MG tablet Take 30 mg by mouth daily with supper.  Patient not taking: Reported on 12/25/2023    [provider]  COMFORT EZ PEN NEEDLES 31G X 6 MM MISC Inject into the skin. 03/17/21   [provider]  ferrous sulfate  325 (65 FE) MG tablet Take 325 mg by mouth every other day. Patient not taking: Reported on 12/25/2023    [provider]  fluticasone  (FLONASE ) 50 MCG/ACT nasal spray Place 1 spray into both nostrils daily.    [provider]  gabapentin  (NEURONTIN ) 100 MG capsule Take 100 mg by mouth 3 (three) times daily as needed. M/W/F after dialysis PRN for leg/thigh cramps    [provider]  glucose 4 GM chewable tablet Chew 1 tablet by mouth as needed for low blood sugar. If blood sugar less than 70. Chew 3 tablets and recheck blood sugar in 15 minutes    [provider]  glucose blood test strip 1 each by Other route as needed for other. Use as instructed    [provider]  JANUVIA 25 MG tablet Take 25 mg by mouth daily. 11/02/20   [provider]  lanthanum  (FOSRENOL ) 1000 MG chewable tablet Chew 1,000 mg by mouth 2 (two) times daily with a meal.     [provider]  LOKELMA 10 g PACK packet Take 1 packet by mouth as directed. Taken on Sat and Sundays midday Patient not taking: Reported on 12/25/2023 09/25/23   [provider]  loratadine  (CLARITIN ) 10 MG tablet Take 10 mg by mouth daily as needed for allergies.    [provider]  Multiple Vitamins-Minerals (PRESERVISION AREDS 2) CAPS Take 1 capsule by mouth 2 (two) times daily. 09/18/23   [provider]  patiromer (VELTASSA) 8.4 g packet Take 8.4 g by mouth daily.    [provider]  senna-docusate (SENOKOT-S) 8.6-50 MG tablet Take 1-2 tablets by mouth at bedtime as needed for mild constipation or moderate constipation. 1-2 tablets at bedtime if needed    [provider]  STOOL SOFTENER 100 MG capsule Take 100 mg by mouth 2 (two) times daily. 09/18/23   [provider]  tiZANidine  (ZANAFLEX ) 4 MG tablet Take 4 mg by mouth 2 (two) times daily as needed for muscle spasms. 09/25/23   [provider]  TRUEplus Lancets 28G MISC Apply topically. 03/17/21   [provider]  DANYA INSULIN  SYRINGE 31G X 5/16 0.3 ML MISC SMARTSIG:Injection Daily 03/31/21   [provider]    Social History   Socioeconomic History   Marital status: Single    Spouse name: Not on file   Number of children: Not on file   Years of education: Not on file   Highest education level: Not on file  Occupational History   Not on file  Tobacco Use   Smoking status: Never   Smokeless tobacco: Never  Vaping  Use   Vaping status: Never Used  Substance and Sexual Activity   Alcohol use: No   Drug use: No   Sexual activity: Not on file  Other Topics Concern   Not on file  Social History Narrative   [name] Dickinson, friend, lives with and have been together 33 years.   Social Drivers of Corporate investment banker Strain: Not on file  Food Insecurity: No Food Insecurity (10/01/2023)   Hunger Vital Sign    Worried About Running Out of Food in the Last Year: Never true    Ran Out of Food in the Last Year: Never true  Transportation Needs: No Transportation Needs (10/01/2023)   PRAPARE - Administrator, Civil Service (Medical): No    Lack of Transportation (Non-Medical): No  Physical Activity: Not on file  Stress: Not on file  Social Connections: Moderately Isolated (10/01/2023)   Social Connection and Isolation Panel    Frequency of Communication with Friends and Family: More than three times a week    Frequency of Social Gatherings with Friends and Family: More than three times a week    Attends Religious Services: Never    Database administrator or Organizations: No    Attends Banker Meetings: Never     Marital Status: Living with partner  Intimate Partner Violence: Not At Risk (10/01/2023)   Humiliation, Afraid, Rape, and Kick questionnaire    Fear of Current or Ex-Partner: No    Emotionally Abused: No    Physically Abused: No    Sexually Abused: No     Family History  Problem Relation Age of Onset   Hypertension Mother    Diabetes Mother    Hypertension Father    Diabetes Father    Diabetes Brother    Hypertension Brother    Breast cancer Neg Hx     ROS: Otherwise negative unless mentioned in HPI  Physical Examination  Vitals:   01/21/24 1049  BP: (!) 133/106  Pulse: 68  Resp: 18  Temp: 98.2 F (36.8 C)  SpO2: 91%   Body mass index is 43.96 kg/m.  General:  WDWN in NAD Gait: Not observed HENT: WNL, normocephalic Pulmonary: normal non-labored breathing, without Rales, rhonchi,  wheezing Cardiac: regular, without  Murmurs, rubs or gallops; without carotid bruits Abdomen: Positive bowel sounds, soft, NT/ND, no masses Skin: without rashes Vascular Exam/Pulses: Bilateral lower extremity AKA's. Palpable bilateral radial pulses  Extremities: without ischemic changes, without Gangrene , without cellulitis; without open wounds;  Musculoskeletal: no muscle wasting or atrophy  Neurologic: A&O X 3;  No focal weakness or paresthesias are detected; speech is fluent/normal Psychiatric:  The pt has Normal affect. Lymph:  Unremarkable  CBC    Component Value Date/Time   WBC 11.5 (H) 10/12/2023 1417   RBC 3.32 (L) 10/12/2023 1417   HGB 8.7 (L) 10/12/2023 1417   HGB 12.4 12/03/2012 1414   HCT 25.3 (L) 10/12/2023 1417   HCT 38.0 12/03/2012 1414   PLT 228 10/12/2023 1417   PLT 193 12/03/2012 1414   MCV 76.2 (L) 10/12/2023 1417   MCV 81 12/03/2012 1414   MCH 26.2 10/12/2023 1417   MCHC 34.4 10/12/2023 1417   RDW 17.7 (H) 10/12/2023 1417   RDW 17.7 (H) 12/03/2012 1414   LYMPHSABS 0.7 10/01/2023 0912   MONOABS 0.8 10/01/2023 0912   EOSABS 0.0 10/01/2023 0912    BASOSABS 0.0 10/01/2023 0912    BMET    Component Value Date/Time  NA 133 (L) 10/12/2023 0502   NA 138 12/03/2012 1414   K 3.9 10/31/2023 0945   K 4.5 12/03/2012 1414   CL 97 (L) 10/12/2023 0502   CL 101 12/03/2012 1414   CO2 22 10/12/2023 0502   CO2 27 12/03/2012 1414   GLUCOSE 155 (H) 10/12/2023 0502   GLUCOSE 326 (H) 12/03/2012 1414   BUN 33 (H) 10/12/2023 0502   BUN 37 (H) 12/03/2012 1414   CREATININE 6.16 (H) 10/12/2023 0502   CREATININE 7.04 (H) 12/03/2012 1414   CALCIUM  8.5 (L) 10/12/2023 0502   CALCIUM  8.4 (L) 12/03/2012 1414   GFRNONAA 7 (L) 10/12/2023 0502   GFRNONAA 6 (L) 12/03/2012 1414   GFRAA 9 (L) 11/19/2019 1002   GFRAA 7 (L) 12/03/2012 1414    COAGS: Lab Results  Component Value Date   INR 1.2 10/06/2023   INR 1.2 10/03/2023   INR 1.4 (H) 10/01/2023     Non-Invasive Vascular Imaging:   None   Statin:  Yes.   Beta Blocker:  No. Aspirin :  No. ACEI:  No. ARB:  No. CCB use:  No Other antiplatelets/anticoagulants:  Yes.   Eliquis  5 mg twice daily   ASSESSMENT/PLAN: This is a 73 y.o. female who presented to her outpatient dialysis today with a catheter that has migrated out after a broken suture holding it in place in her left groin.   Vascular surgery plans on taking the patient to the vascular lab for a dialysis perma catheter exchange. I had a long detailed discussion with the patient  at the bedside in the emergency room concerning the procedure, benefits, risks and complications. Patient verbalized her understanding and wishes to proceed. She endorses she ate breakfast this morning.     -I discussed the case in detail with Dr Selinda Gu MD and he agrees with the plan.    Amy Wu [facility] 01/21/2024 11:53 AM

## 2024-01-22 ENCOUNTER — Encounter: Payer: Self-pay | Admitting: Vascular Surgery

## 2024-03-18 ENCOUNTER — Other Ambulatory Visit: Payer: Self-pay

## 2024-03-18 ENCOUNTER — Inpatient Hospital Stay
Admission: EM | Admit: 2024-03-18 | Discharge: 2024-03-20 | DRG: 811 | Disposition: A | Discharge status: Home / Self Care | Attending: Internal Medicine | Admitting: Internal Medicine

## 2024-03-18 DIAGNOSIS — Z87898 Personal history of other specified conditions: Secondary | ICD-10-CM

## 2024-03-18 DIAGNOSIS — Z7901 Long term (current) use of anticoagulants: Secondary | ICD-10-CM

## 2024-03-18 DIAGNOSIS — E66812 Obesity, class 2: Secondary | ICD-10-CM | POA: Diagnosis present

## 2024-03-18 DIAGNOSIS — Z992 Dependence on renal dialysis: Secondary | ICD-10-CM

## 2024-03-18 DIAGNOSIS — D649 Anemia, unspecified: Principal | ICD-10-CM | POA: Diagnosis present

## 2024-03-18 DIAGNOSIS — D631 Anemia in chronic kidney disease: Secondary | ICD-10-CM | POA: Diagnosis present

## 2024-03-18 DIAGNOSIS — D519 Vitamin B12 deficiency anemia, unspecified: Secondary | ICD-10-CM | POA: Diagnosis not present

## 2024-03-18 DIAGNOSIS — I120 Hypertensive chronic kidney disease with stage 5 chronic kidney disease or end stage renal disease: Secondary | ICD-10-CM | POA: Diagnosis present

## 2024-03-18 DIAGNOSIS — I484 Atypical atrial flutter: Secondary | ICD-10-CM | POA: Diagnosis present

## 2024-03-18 DIAGNOSIS — N186 End stage renal disease: Secondary | ICD-10-CM | POA: Diagnosis present

## 2024-03-18 DIAGNOSIS — I252 Old myocardial infarction: Secondary | ICD-10-CM

## 2024-03-18 DIAGNOSIS — E785 Hyperlipidemia, unspecified: Secondary | ICD-10-CM | POA: Diagnosis present

## 2024-03-18 DIAGNOSIS — E119 Type 2 diabetes mellitus without complications: Secondary | ICD-10-CM

## 2024-03-18 DIAGNOSIS — E1122 Type 2 diabetes mellitus with diabetic chronic kidney disease: Secondary | ICD-10-CM | POA: Diagnosis present

## 2024-03-18 DIAGNOSIS — Z888 Allergy status to other drugs, medicaments and biological substances status: Secondary | ICD-10-CM

## 2024-03-18 DIAGNOSIS — N2581 Secondary hyperparathyroidism of renal origin: Secondary | ICD-10-CM | POA: Diagnosis present

## 2024-03-18 DIAGNOSIS — Z833 Family history of diabetes mellitus: Secondary | ICD-10-CM

## 2024-03-18 DIAGNOSIS — Z7984 Long term (current) use of oral hypoglycemic drugs: Secondary | ICD-10-CM

## 2024-03-18 DIAGNOSIS — I2510 Atherosclerotic heart disease of native coronary artery without angina pectoris: Secondary | ICD-10-CM | POA: Diagnosis present

## 2024-03-18 DIAGNOSIS — Z1152 Encounter for screening for COVID-19: Secondary | ICD-10-CM

## 2024-03-18 DIAGNOSIS — Z91041 Radiographic dye allergy status: Secondary | ICD-10-CM

## 2024-03-18 DIAGNOSIS — Z89512 Acquired absence of left leg below knee: Secondary | ICD-10-CM

## 2024-03-18 DIAGNOSIS — Z79899 Other long term (current) drug therapy: Secondary | ICD-10-CM

## 2024-03-18 DIAGNOSIS — E1151 Type 2 diabetes mellitus with diabetic peripheral angiopathy without gangrene: Secondary | ICD-10-CM | POA: Diagnosis present

## 2024-03-18 DIAGNOSIS — Z8249 Family history of ischemic heart disease and other diseases of the circulatory system: Secondary | ICD-10-CM

## 2024-03-18 LAB — CBC WITH DIFFERENTIAL/PLATELET
Abs Immature Granulocytes: 0.03 K/uL (ref 0.00–0.07)
Basophils Absolute: 0 K/uL (ref 0.0–0.1)
Basophils Relative: 0 %
Eosinophils Absolute: 0.4 K/uL (ref 0.0–0.5)
Eosinophils Relative: 9 %
HCT: 18.1 % — ABNORMAL LOW (ref 36.0–46.0)
Hemoglobin: 5.7 g/dL — ABNORMAL LOW (ref 12.0–15.0)
Immature Granulocytes: 1 %
Lymphocytes Relative: 18 %
Lymphs Abs: 0.8 K/uL (ref 0.7–4.0)
MCH: 26.4 pg (ref 26.0–34.0)
MCHC: 31.5 g/dL (ref 30.0–36.0)
MCV: 83.8 fL (ref 80.0–100.0)
Monocytes Absolute: 0.7 K/uL (ref 0.1–1.0)
Monocytes Relative: 16 %
Neutro Abs: 2.5 K/uL (ref 1.7–7.7)
Neutrophils Relative %: 56 %
Platelets: 127 K/uL — ABNORMAL LOW (ref 150–400)
RBC: 2.16 MIL/uL — ABNORMAL LOW (ref 3.87–5.11)
RDW: 22.5 % — ABNORMAL HIGH (ref 11.5–15.5)
Smear Review: NORMAL
WBC: 4.5 K/uL (ref 4.0–10.5)
nRBC: 0 % (ref 0.0–0.2)

## 2024-03-18 LAB — IRON AND TIBC
Iron: 38 ug/dL (ref 28–170)
Saturation Ratios: 29 % (ref 10.4–31.8)
TIBC: 133 ug/dL — ABNORMAL LOW (ref 250–450)
UIBC: 95 ug/dL

## 2024-03-18 LAB — RETICULOCYTES
Immature Retic Fract: 25.3 % — ABNORMAL HIGH (ref 2.3–15.9)
RBC.: 2.17 MIL/uL — ABNORMAL LOW (ref 3.87–5.11)
Retic Count, Absolute: 97.9 K/uL (ref 19.0–186.0)
Retic Ct Pct: 4.5 % — ABNORMAL HIGH (ref 0.4–3.1)

## 2024-03-18 LAB — COMPREHENSIVE METABOLIC PANEL WITH GFR
ALT: 19 U/L (ref 0–44)
AST: 19 U/L (ref 15–41)
Albumin: 3.2 g/dL — ABNORMAL LOW (ref 3.5–5.0)
Alkaline Phosphatase: 84 U/L (ref 38–126)
Anion gap: 12 (ref 5–15)
BUN: 39 mg/dL — ABNORMAL HIGH (ref 8–23)
CO2: 23 mmol/L (ref 22–32)
Calcium: 8.9 mg/dL (ref 8.9–10.3)
Chloride: 106 mmol/L (ref 98–111)
Creatinine, Ser: 4.96 mg/dL — ABNORMAL HIGH (ref 0.44–1.00)
GFR, Estimated: 9 mL/min — ABNORMAL LOW (ref >60)
Glucose, Bld: 136 mg/dL — ABNORMAL HIGH (ref 70–99)
Potassium: 3.7 mmol/L (ref 3.5–5.1)
Sodium: 141 mmol/L (ref 135–145)
Total Bilirubin: 0.5 mg/dL (ref 0.0–1.2)
Total Protein: 6.1 g/dL — ABNORMAL LOW (ref 6.5–8.1)

## 2024-03-18 LAB — FERRITIN: Ferritin: 810 ng/mL — ABNORMAL HIGH (ref 11–307)

## 2024-03-18 LAB — FOLATE: Folate: 7.8 ng/mL (ref >5.9)

## 2024-03-18 LAB — PREPARE RBC (CROSSMATCH)

## 2024-03-18 LAB — GLUCOSE, CAPILLARY: Glucose-Capillary: 133 mg/dL — ABNORMAL HIGH (ref 70–99)

## 2024-03-18 MED ORDER — CALCITRIOL 0.25 MCG PO CAPS
0.2500 ug | ORAL_CAPSULE | ORAL | Status: DC
  Administered 2024-03-19: 0.25 ug via ORAL
  Filled 2024-03-18 (×2): qty 1

## 2024-03-18 MED ORDER — AMIODARONE HCL 200 MG PO TABS
200.0000 mg | ORAL_TABLET | Freq: Every day | ORAL | Status: DC
  Administered 2024-03-19 – 2024-03-20 (×2): 200 mg via ORAL
  Filled 2024-03-18 (×2): qty 1

## 2024-03-18 MED ORDER — GABAPENTIN 100 MG PO CAPS
100.0000 mg | ORAL_CAPSULE | Freq: Three times a day (TID) | ORAL | Status: DC | PRN
  Administered 2024-03-19 (×2): 100 mg via ORAL
  Filled 2024-03-18 (×2): qty 1

## 2024-03-18 MED ORDER — HYDROCODONE-ACETAMINOPHEN 5-325 MG PO TABS: 1.0000 | ORAL_TABLET | ORAL | Status: DC | PRN

## 2024-03-18 MED ORDER — TIZANIDINE HCL 4 MG PO TABS
4.0000 mg | ORAL_TABLET | Freq: Two times a day (BID) | ORAL | Status: DC | PRN
  Administered 2024-03-19: 4 mg via ORAL
  Filled 2024-03-18 (×2): qty 1

## 2024-03-18 MED ORDER — ONDANSETRON HCL 4 MG/2ML IJ SOLN: 4.0000 mg | Freq: Four times a day (QID) | INTRAMUSCULAR | Status: DC | PRN

## 2024-03-18 MED ORDER — DOCUSATE SODIUM 100 MG PO CAPS: 100.0000 mg | ORAL_CAPSULE | Freq: Two times a day (BID) | ORAL | Status: DC

## 2024-03-18 MED ORDER — HYDROMORPHONE HCL 1 MG/ML IJ SOLN: 0.5000 mg | INTRAMUSCULAR | Status: DC | PRN

## 2024-03-18 MED ORDER — SODIUM CHLORIDE 0.9 % IV SOLN: Freq: Once | INTRAVENOUS | Status: DC

## 2024-03-18 MED ORDER — ACETAMINOPHEN 325 MG PO TABS: 650.0000 mg | ORAL_TABLET | Freq: Four times a day (QID) | ORAL | Status: DC | PRN

## 2024-03-18 MED ORDER — ONDANSETRON HCL 4 MG PO TABS: 4.0000 mg | ORAL_TABLET | Freq: Four times a day (QID) | ORAL | Status: DC | PRN

## 2024-03-18 MED ORDER — ACETAMINOPHEN 650 MG RE SUPP: 650.0000 mg | Freq: Four times a day (QID) | RECTAL | Status: DC | PRN

## 2024-03-18 MED ORDER — ATORVASTATIN CALCIUM 20 MG PO TABS
40.0000 mg | ORAL_TABLET | Freq: Every day | ORAL | Status: DC
  Administered 2024-03-19 – 2024-03-20 (×2): 40 mg via ORAL
  Filled 2024-03-18 (×2): qty 2

## 2024-03-18 MED ORDER — INSULIN ASPART 100 UNIT/ML IJ SOLN
0.0000 [IU] | INTRAMUSCULAR | Status: DC
  Administered 2024-03-19 (×2): 1 [IU] via SUBCUTANEOUS
  Filled 2024-03-18 (×2): qty 1

## 2024-03-18 MED ORDER — SODIUM CHLORIDE 0.9 % IV SOLN: 10.0000 mL/h | Freq: Once | INTRAVENOUS | Status: DC

## 2024-03-18 NOTE — Assessment & Plan Note (Addendum)
 No prior stent-on medical management No complaints of chest pain or shortness of breath Continue atorvastatin  Holding apixaban  given severe anemia

## 2024-03-18 NOTE — ED Notes (Signed)
 MD of patient called  Amy Wu  [phone]  call if you need anything

## 2024-03-18 NOTE — ED Provider Triage Note (Signed)
 Emergency Medicine Provider Triage Evaluation Note  AFREEN SIEBELS , a 73 y.o. female  was evaluated in triage.  Pt complains of low hemoglobin.  She is asymptomatic.  She had dialysis yesterday and has felt fine since.  She will go to failure to cope to the emergency department for hemoglobin of 5.9..  Physical Exam  There were no vitals taken for this visit. Gen:   Awake, no distress   Resp:  Normal effort  MSK:   Moves extremities without difficulty  Other:    Medical Decision Making  Medically screening exam initiated at 5:53 PM.  Appropriate orders placed.  Lacresia G Stach was informed that the remainder of the evaluation will be completed by another provider, this initial triage assessment does not replace that evaluation, and the importance of remaining in the ED until their evaluation is complete.  Repeat labs requested.   Herlinda Kirk NOVAK, FNP 03/18/24 2008

## 2024-03-18 NOTE — H&P (Signed)
 History and Physical    Patient: Amy Wu FMW:[id number] DOB: 1950/07/16 DOA: 03/18/2024 DOS: the patient was seen and examined on 03/18/2024 PCP: Fleeta Pedro, Kate BRAVO, DO  Patient coming from: Home  Chief Complaint:  Chief Complaint  Patient presents with   Abnormal Lab    Hgb 5.9    HPI: Amy Wu is a 73 y.o. 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  Patient denies blood in stool or black stool.  Denies vaginal bleeding or blood in the urine.  Has generalized weakness but denies shortness of breath, chest pain, palpitations or lightheadedness.   In the ED BP 139/55 and pulse in the 60s O2 sats high 90s on room air. Labs including CBC and CMP notable for6 hemoglobin 5.7 down from 8.24 September 2023 No further workup done from the ED Admission requested for transfusion and anemia workup.     Review of Systems: As mentioned in the history of present illness. All other systems reviewed and are negative.  Past Medical History:  Diagnosis Date   Arthritis    Atypical atrial flutter (HCC)    Chronic kidney disease    Complication of anesthesia    Diabetes mellitus without complication (HCC)    Hyperlipidemia    NSTEMI (non-ST elevated myocardial infarction) (HCC) 10/2023   Peripheral vascular disease    Past Surgical History:  Procedure Laterality Date   A/V SHUNT INTERVENTION Left 11/20/2019   Procedure: A/V SHUNT INTERVENTION (Left Thigh);  Surgeon: Marea Selinda RAMAN, MD;  Location: [facility];  Service: Cardiovascular;  Laterality: Left;   A/V SHUNTOGRAM Left 02/16/2017   Procedure: A/V Shuntogram;  Surgeon: Marea Selinda RAMAN, MD;  Location: [facility];  Service: Cardiovascular;  Laterality: Left;   A/V SHUNTOGRAM Left 12/13/2017   Procedure: A/V SHUNTOGRAM;  Surgeon: Marea Selinda RAMAN, MD;  Location: [facility];  Service: Cardiovascular;  Laterality: Left;   A/V SHUNTOGRAM Left 12/05/2018   Procedure: A/V SHUNTOGRAM;  Surgeon: Marea Selinda RAMAN, MD;  Location: [facility];  Service: Cardiovascular;  Laterality: Left;   A/V SHUNTOGRAM Left 06/03/2020   Procedure: A/V SHUNTOGRAM;  Surgeon: Marea Selinda RAMAN, MD;  Location: [facility];  Service: Cardiovascular;  Laterality: Left;   A/V SHUNTOGRAM Left 05/24/2021   Procedure: A/V SHUNTOGRAM;  Surgeon: Jama Cordella KANDICE, MD;  Location: [facility];  Service: Cardiovascular;  Laterality: Left;  Thigh Graft   ABOVE KNEE LEG AMPUTATION     AV FISTULA PLACEMENT Bilateral    AV FISTULA PLACEMENT Left 07/01/2015   Procedure: INSERTION OF ARTERIOVENOUS (AV) GRAFT THIGH (ARTEGRAFT);  Surgeon: Selinda RAMAN Marea, MD;  Location: [facility];  Service: Vascular;  Laterality: Left;   BELOW KNEE LEG AMPUTATION Left    BREAST BIOPSY     CATARACT EXTRACTION W/PHACO Left 07/10/2022   Procedure: CATARACT EXTRACTION PHACO AND INTRAOCULAR LENS PLACEMENT (IOC) LEFT DIABETIC  12.98  01:13.7;  Surgeon: Myrna Adine Anes, MD;  Location: [facility];  Service: Ophthalmology;  Laterality: Left;   DIALYSIS/PERMA CATHETER INSERTION N/A 05/26/2021   Procedure: DIALYSIS/PERMA CATHETER INSERTION;  Surgeon: Marea Selinda RAMAN, MD;  Location:  [facility];  Service: Cardiovascular;  Laterality: N/A;   DIALYSIS/PERMA CATHETER INSERTION N/A 07/05/2022   Procedure: DIALYSIS/PERMA CATHETER INSERTION;  Surgeon: Marea Selinda RAMAN, MD;  Location: [facility];  Service: Cardiovascular;  Laterality: N/A;   DIALYSIS/PERMA CATHETER INSERTION N/A 07/28/2022   Procedure: DIALYSIS/PERMA CATHETER INSERTION;  Surgeon: Marea Selinda RAMAN, MD;  Location: [facility];  Service:  Cardiovascular;  Laterality: N/A;   DIALYSIS/PERMA CATHETER INSERTION N/A 09/21/2022   Procedure: DIALYSIS/PERMA CATHETER INSERTION;  Surgeon: Marea Selinda RAMAN, MD;  Location: [facility];  Service: Cardiovascular;  Laterality: N/A;   DIALYSIS/PERMA CATHETER INSERTION N/A 02/15/2023   Procedure: DIALYSIS/PERMA CATHETER INSERTION;  Surgeon: Marea Selinda RAMAN, MD;  Location: [facility];  Service: Cardiovascular;  Laterality: N/A;   DIALYSIS/PERMA CATHETER INSERTION N/A 04/09/2023   Procedure: DIALYSIS/PERMA CATHETER INSERTION;  Surgeon: Jama Cordella MATSU, MD;  Location: [facility];  Service: Cardiovascular;  Laterality: N/A;   DIALYSIS/PERMA CATHETER INSERTION N/A 10/05/2023   Procedure: DIALYSIS/PERMA CATHETER INSERTION;  Surgeon: Marea Selinda RAMAN, MD;  Location: [facility];  Service: Cardiovascular;  Laterality: N/A;   DIALYSIS/PERMA CATHETER INSERTION N/A 11/01/2023   Procedure: DIALYSIS/PERMA CATHETER INSERTION;  Surgeon: Marea Selinda RAMAN, MD;  Location: [facility];  Service: Cardiovascular;  Laterality: N/A;   DIALYSIS/PERMA CATHETER REPAIR Right 09/24/2023   Procedure: DIALYSIS/PERMA CATHETER REPAIR;  Surgeon: Jama Cordella MATSU, MD;  Location: [facility];  Service: Cardiovascular;  Laterality: Right;   DIALYSIS/PERMA CATHETER REPAIR Left 01/21/2024   Procedure: DIALYSIS/PERMA CATHETER REPAIR;  Surgeon: Marea Selinda RAMAN, MD;  Location: [facility];  Service: Cardiovascular;  Laterality: Left;   ENDARTERECTOMY FEMORAL Left 07/01/2015   Procedure: SUPERFICIAL FEMORAL ENDARTERECTOMY ;  Surgeon: Selinda RAMAN Marea, MD;  Location: [facility];  Service: Vascular;  Laterality: Left;   EXCHANGE OF A DIALYSIS CATHETER  02/11/2015   Procedure: Exchange Of A Dialysis Catheter;  Surgeon: Selinda RAMAN Marea, MD;  Location: [facility];  Service: Cardiovascular;;   LEFT HEART CATH AND CORONARY ANGIOGRAPHY N/A 10/04/2023   Procedure: LEFT HEART CATH AND CORONARY ANGIOGRAPHY;   Surgeon: Anner Alm ORN, MD;  Location: [facility];  Service: Cardiovascular;  Laterality: N/A;   LEG AMPUTATION ABOVE KNEE Right    PERIPHERAL VASCULAR CATHETERIZATION N/A 02/11/2015   Procedure: Dialysis/Perma Catheter Insertion;  Surgeon: Selinda RAMAN Marea, MD;  Location: [facility];  Service: Cardiovascular;  Laterality: N/A;   PERIPHERAL VASCULAR CATHETERIZATION N/A 05/11/2015   Procedure: Dialysis/Perma Catheter Insertion, exchange;  Surgeon: Cordella MATSU Jama, MD;  Location: [facility];  Service: Cardiovascular;  Laterality: N/A;   PERIPHERAL VASCULAR CATHETERIZATION N/A 05/18/2015   Procedure: Dialysis/Perma Catheter Insertion;  Surgeon: Cordella MATSU Jama, MD;  Location: [facility];  Service: Cardiovascular;  Laterality: N/A;   PERIPHERAL VASCULAR CATHETERIZATION Left 07/19/2015   Procedure: A/V Shuntogram/Fistulagram;  Surgeon: Selinda RAMAN Marea, MD;  Location: [facility];  Service: Cardiovascular;  Laterality: Left;   PERIPHERAL VASCULAR CATHETERIZATION N/A 07/19/2015   Procedure: A/V Shunt Intervention;  Surgeon: Selinda RAMAN Marea, MD;  Location: [facility];  Service: Cardiovascular;  Laterality: N/A;   PERIPHERAL VASCULAR CATHETERIZATION N/A 08/26/2015   Procedure: Dialysis/Perma Catheter Removal;  Surgeon: Selinda RAMAN Marea, MD;  Location: [facility];  Service: Cardiovascular;  Laterality: N/A;   PERIPHERAL VASCULAR THROMBECTOMY N/A 12/29/2016   Procedure: Peripheral Vascular Thrombectomy;  Surgeon: Jama Cordella  G, MD;  Location: [facility];  Service: Cardiovascular;  Laterality: N/A;   Social History:  reports that she has never smoked. She has never used smokeless tobacco. She reports that she does not drink alcohol and does not use drugs.  Allergies  Allergen Reactions   Cefepime  Other (See Comments)    Encephalopathy    Ancef  [Cefazolin ] Palpitations   Contrast Media [Iodinated Contrast Media] Palpitations    Family  History  Problem Relation Age of Onset   Hypertension Mother    Diabetes Mother    Hypertension Father    Diabetes Father    Diabetes Brother    Hypertension Brother    Breast cancer Neg Hx     Prior to Admission medications   Medication Sig Start Date End Date Taking? Authorizing Provider  acetaminophen  (TYLENOL ) 500 MG tablet Take 1,000 mg by mouth every 8 (eight) hours as needed for moderate pain.    [provider]  amiodarone  (PACERONE ) 200 MG tablet Take 1 tablet (200 mg total) by mouth daily. 12/25/23   Gollan, Timothy J, MD  apixaban  (ELIQUIS ) 5 MG TABS tablet Take 1 tablet (5 mg total) by mouth 2 (two) times daily. 12/25/23   Gollan, Timothy J, MD  atorvastatin  (LIPITOR) 40 MG tablet Take 1 tablet (40 mg total) by mouth daily. 12/25/23   Gollan, Timothy J, MD  calcitRIOL  (ROCALTROL ) 0.25 MCG capsule Take 0.25 mcg by mouth daily. Take 3 capsules by mouth three times week at dialysis    [provider]  cinacalcet  (SENSIPAR ) 30 MG tablet Take 30 mg by mouth daily with supper.  Patient not taking: Reported on 12/25/2023    [provider]  COMFORT EZ PEN NEEDLES 31G X 6 MM MISC Inject into the skin. 03/17/21   [provider]  ferrous sulfate  325 (65 FE) MG tablet Take 325 mg by mouth every other day. Patient not taking: Reported on 12/25/2023    [provider]  fluticasone  (FLONASE ) 50 MCG/ACT nasal spray Place 1 spray into both nostrils daily.    [provider]  gabapentin  (NEURONTIN ) 100 MG capsule Take 100 mg by mouth 3 (three) times daily as needed. M/W/F after dialysis PRN for leg/thigh cramps    [provider]  glucose 4 GM chewable tablet Chew 1 tablet by mouth as needed for low blood sugar. If blood sugar less than 70. Chew 3 tablets and recheck blood sugar in 15 minutes    [provider]  glucose blood test strip 1 each by Other route as needed for other. Use as instructed    [provider]  JANUVIA  25 MG tablet Take 25 mg by mouth daily. 11/02/20   [provider]  lanthanum  (FOSRENOL ) 1000 MG chewable tablet Chew 1,000 mg by mouth 2 (two) times daily with a meal.     [provider]  LOKELMA 10 g PACK packet Take 1 packet by mouth as directed. Taken on Sat and Sundays midday Patient not taking: Reported on 12/25/2023 09/25/23   [provider]  loratadine  (CLARITIN ) 10 MG tablet Take 10 mg by mouth daily as needed for allergies.    [provider]  Multiple Vitamins-Minerals (PRESERVISION AREDS 2) CAPS Take 1 capsule by mouth 2 (two) times daily. 09/18/23   [provider]  patiromer (VELTASSA) 8.4 g packet Take 8.4 g by mouth daily.    [provider]  senna-docusate (SENOKOT-S) 8.6-50 MG tablet Take 1-2 tablets by mouth at  bedtime as needed for mild constipation or moderate constipation. 1-2 tablets at bedtime if needed    [provider]  STOOL SOFTENER 100 MG capsule Take 100 mg by mouth 2 (two) times daily. 09/18/23   [provider]  tiZANidine  (ZANAFLEX ) 4 MG tablet Take 4 mg by mouth 2 (two) times daily as needed for muscle spasms. 09/25/23   [provider]  TRUEplus Lancets 28G MISC Apply topically. 03/17/21   [provider]  ULTICARE INSULIN  SYRINGE 31G X 5/16 0.3 ML MISC SMARTSIG:Injection Daily 03/31/21   [provider]    Physical Exam: Vitals:   03/18/24 1812 03/18/24 2000 03/18/24 2036 03/18/24 2051  BP:  (!) 148/57 (!) 166/61 (!) 139/47  Pulse: (!) 59 60 67 64  Resp:  18 19 20   Temp:  98.8 F (37.1 C) 98.8 F (37.1 C) 98.6 F (37 C)  TempSrc:  Oral Oral Oral  SpO2: 100% 95%     Physical Exam Vitals and nursing note reviewed.  Constitutional:      General: She is not in acute distress. HENT:     Head: Normocephalic and atraumatic.  Cardiovascular:     Rate and Rhythm: Normal rate and regular rhythm.     Heart sounds: Normal heart sounds.  Pulmonary:     Effort:  Pulmonary effort is normal.     Breath sounds: Normal breath sounds.  Abdominal:     Palpations: Abdomen is soft.     Tenderness: There is no abdominal tenderness.  Neurological:     Mental Status: Mental status is at baseline.     Labs on Admission: I have personally reviewed following labs and imaging studies  CBC: Recent Labs  Lab 03/18/24 1844  WBC 4.5  NEUTROABS 2.5  HGB 5.7*  HCT 18.1*  MCV 83.8  PLT 127*   Basic Metabolic Panel: Recent Labs  Lab 03/18/24 1844  NA 141  K 3.7  CL 106  CO2 23  GLUCOSE 136*  BUN 39*  CREATININE 4.96*  CALCIUM  8.9   GFR: CrCl cannot be calculated (Unknown ideal weight.). Liver Function Tests: Recent Labs  Lab 03/18/24 1844  AST 19  ALT 19  ALKPHOS 84  BILITOT 0.5  PROT 6.1*  ALBUMIN 3.2*   No results for input(s): LIPASE, AMYLASE in the last 168 hours. No results for input(s): AMMONIA in the last 168 hours. Coagulation Profile: No results for input(s): INR, PROTIME in the last 168 hours. Cardiac Enzymes: No results for input(s): CKTOTAL, CKMB, CKMBINDEX, TROPONINI in the last 168 hours. BNP (last 3 results) No results for input(s): PROBNP in the last 8760 hours. HbA1C: No results for input(s): HGBA1C in the last 72 hours. CBG: No results for input(s): GLUCAP in the last 168 hours. Lipid Profile: No results for input(s): CHOL, HDL, LDLCALC, TRIG, CHOLHDL, LDLDIRECT in the last 72 hours. Thyroid Function Tests: No results for input(s): TSH, T4TOTAL, FREET4, T3FREE, THYROIDAB in the last 72 hours. Anemia Panel: No results for input(s): VITAMINB12, FOLATE, FERRITIN, TIBC, IRON, RETICCTPCT in the last 72 hours. Urine analysis:    Component Value Date/Time   COLORURINE Yellow 12/28/2011 1746   APPEARANCEUR Turbid 12/28/2011 1746   LABSPEC 1.010 12/28/2011 1746   PHURINE 8.0 12/28/2011 1746   GLUCOSEU >=500 12/28/2011 1746   HGBUR 2+ 12/28/2011 1746    BILIRUBINUR Negative 12/28/2011 1746   KETONESUR Negative 12/28/2011 1746   PROTEINUR 100 mg/dL 92/88/7986 8253   NITRITE Negative 12/28/2011 1746   LEUKOCYTESUR 3+ 12/28/2011  1746    Radiological Exams on Admission: No results found. Data Reviewed for HPI: Relevant notes from primary care and specialist visits, past discharge summaries as available in EHR, including Care Everywhere. Prior diagnostic testing as pertinent to current admission diagnoses Updated medications and problem lists for reconciliation ED course, including vitals, labs, imaging, treatment and response to treatment Triage notes, nursing and pharmacy notes and ED provider's notes Notable results as noted above in HPI      Assessment and Plan: * Severe anemia Hemoglobin 5.7 down from 8.7 a few months prior No external bleeding noted Had a normal screening colonoscopy  at [facility] 2022 Holding apixaban  tonight Type cross and transfuse 1 unit to hemoglobin 7, can consider another unit during dialysis on 03/19/2024 Can consider GI consult  CAD  with severe RCA disease on [facility] 09/2023 No prior stent-on medical management No complaints of chest pain or shortness of breath Continue atorvastatin  Holding apixaban  given severe anemia  ESRD on dialysis [facility]) Nephrology consult for continuation of dialysis  Atypical atrial flutter (HCC) Continue amiodarone   Holding apixaban  due to severe anemia of uncertain etiology  Type 2 diabetes mellitus (HCC) Sliding scale insulin  coverage  History of serratia bacteremia/septic shock 09/2023 No acute issues suspected        DVT prophylaxis: scd   Consults: renal, GI  Advance Care Planning: full code  Family Communication: none  Disposition Plan: Back to previous home environment  Severity of Illness: The appropriate patient status for this patient is OBSERVATION. Observation status is judged to be reasonable and necessary in order to provide the  required intensity of service to ensure the patient's safety. The patient's presenting symptoms, physical exam findings, and initial radiographic and laboratory data in the context of their medical condition is felt to place them at decreased risk for further clinical deterioration. Furthermore, it is anticipated that the patient will be medically stable for discharge from the hospital within 2 midnights of admission.   Author: Delayne LULLA Solian, MD 03/18/2024 9:56 PM  For on call review [email or web address].

## 2024-03-18 NOTE — Assessment & Plan Note (Addendum)
 Hemoglobin 5.7 down from 8.7 a few months prior No external bleeding noted Had a normal screening colonoscopy  at [facility] 2022 Holding apixaban  tonight Type cross and transfuse 1 unit to hemoglobin 7, can consider another unit during dialysis on 03/19/2024 Can consider GI consult

## 2024-03-18 NOTE — Assessment & Plan Note (Signed)
 No acute issues suspected

## 2024-03-18 NOTE — ED Notes (Signed)
 IV team at bedside, attempted 2x to obtain second IV unsuccessfully.

## 2024-03-18 NOTE — Hospital Course (Signed)
 Amy Wu

## 2024-03-18 NOTE — ED Notes (Signed)
 RN attempted to get pt blood. RN called phlebotomy.

## 2024-03-18 NOTE — ED Triage Notes (Signed)
 Pt denies any associated symptoms in regards to her low hgb. States she is on eliquis . Pt is AxOx4.

## 2024-03-18 NOTE — ED Provider Notes (Signed)
 [facility] Provider Note    Event Date/Time   First MD Initiated Contact with Patient 03/18/24 1810     (approximate)   History   Abnormal Lab (Hgb 5.9)   HPI  Amy Wu is a 73 y.o. female with a history of ESRD on HD, diabetes, hyperlipidemia, peripheral vascular disease atrial flutter, on Eliquis , who presents with anemia.  She was told that her hemoglobin was 5.9.  The patient denies feeling short of breath, weak or tired.  She is on Eliquis  but denies any abnormal bleeding or bruising.  She denies any blood in her stools or dark tarry stools.  I reviewed the past medical records.  The patient was seen in the ED on 8/4 due to concern for her dialysis catheter malfunctioning.  She was consulted on and evaluated by vascular surgery and had a new permacath placed on that same day.   Physical Exam   Triage Vital Signs: ED Triage Vitals [03/18/24 1754]  Encounter Vitals Group     BP (!) 139/55     Girls Systolic BP Percentile      Girls Diastolic BP Percentile      Boys Systolic BP Percentile      Boys Diastolic BP Percentile      Pulse Rate 62     Resp      Temp 98.4 F (36.9 C)     Temp Source Oral     SpO2 97 %     Weight      Height      Head Circumference      Peak Flow      Pain Score      Pain Loc      Pain Education      Exclude from Growth Chart     Most recent vital signs: Vitals:   03/18/24 2259 03/18/24 2333  BP: (!) 141/68 (!) 163/67  Pulse: 66 67  Resp: 15 20  Temp: 98.2 F (36.8 C) 98 F (36.7 C)  SpO2:  99%     General: Awake, no distress.  CV:  Good peripheral perfusion.  Resp:  Normal effort.  Abd:  No distention.  Other:  Pale appearing.   ED Results / Procedures / Treatments   Labs (all labs ordered are listed, but only abnormal results are displayed) Labs Reviewed  CBC WITH DIFFERENTIAL/PLATELET - Abnormal; Notable for the following components:      Result Value   RBC 2.16 (*)    Hemoglobin  5.7 (*)    HCT 18.1 (*)    RDW 22.5 (*)    Platelets 127 (*)    All other components within normal limits  COMPREHENSIVE METABOLIC PANEL WITH GFR - Abnormal; Notable for the following components:   Glucose, Bld 136 (*)    BUN 39 (*)    Creatinine, Ser 4.96 (*)    Total Protein 6.1 (*)    Albumin 3.2 (*)    GFR, Estimated 9 (*)    All other components within normal limits  IRON AND TIBC - Abnormal; Notable for the following components:   TIBC 133 (*)    All other components within normal limits  FERRITIN - Abnormal; Notable for the following components:   Ferritin 810 (*)    All other components within normal limits  RETICULOCYTES - Abnormal; Notable for the following components:   Retic Ct Pct 4.5 (*)    RBC. 2.17 (*)    Immature Retic Fract 25.3 (*)  All other components within normal limits  GLUCOSE, CAPILLARY - Abnormal; Notable for the following components:   Glucose-Capillary 133 (*)    All other components within normal limits  FOLATE  HEMOGLOBIN  VITAMIN B12  TYPE AND SCREEN  PREPARE RBC (CROSSMATCH)     EKG    RADIOLOGY    PROCEDURES:  Critical Care performed: No  Procedures   MEDICATIONS ORDERED IN ED: Medications  0.9 %  sodium chloride  infusion (has no administration in time range)  amiodarone  (PACERONE ) tablet 200 mg (has no administration in time range)  atorvastatin  (LIPITOR) tablet 40 mg (has no administration in time range)  calcitRIOL  (ROCALTROL ) capsule 0.25 mcg (has no administration in time range)  docusate sodium  (COLACE) capsule 100 mg (has no administration in time range)  gabapentin  (NEURONTIN ) capsule 100 mg (has no administration in time range)  tiZANidine  (ZANAFLEX ) tablet 4 mg (has no administration in time range)  acetaminophen  (TYLENOL ) tablet 650 mg (has no administration in time range)    Or  acetaminophen  (TYLENOL ) suppository 650 mg (has no administration in time range)  ondansetron  (ZOFRAN ) tablet 4 mg (has no  administration in time range)    Or  ondansetron  (ZOFRAN ) injection 4 mg (has no administration in time range)  insulin  aspart (novoLOG ) injection 0-6 Units ( Subcutaneous Not Given 03/19/24 0013)  HYDROcodone -acetaminophen  (NORCO/VICODIN) 5-325 MG per tablet 1-2 tablet (has no administration in time range)  HYDROmorphone  (DILAUDID ) injection 0.5-1 mg (has no administration in time range)  0.9 %  sodium chloride  infusion (has no administration in time range)  Chlorhexidine  Gluconate Cloth 2 % PADS 6 each (has no administration in time range)     IMPRESSION / MDM / ASSESSMENT AND PLAN / ED COURSE  I reviewed the triage vital signs and the nursing notes.  73 year old female with PMH as noted above presents with an outpatient hemoglobin of 5.9, referred to the ED for transfusion.  The patient actually has minimal symptoms, and is having no symptoms of bleeding.  Differential diagnosis includes, but is not limited to, anemia of chronic disease, likely related to her ESRD.  There is no evidence of acute blood loss or iron deficiency.  We will obtain basic labs, likely transfuse PRBCs, and reassess.  Patient's presentation is most consistent with acute presentation with potential threat to life or bodily function.  The patient is on the cardiac monitor to evaluate for evidence of arrhythmia and/or significant heart rate changes.   ----------------------------------------- 9:17 PM on 03/18/2024 -----------------------------------------  CBC confirms hemoglobin of 5.7 with normal MCV.   CMP shows no acute abnormalities.  I have ordered transfusion of 2 units of PRBCs.  I consulted Dr. Cleatus from the hospitalist service; based on our discussion she agrees to evaluate the patient for admission.   FINAL CLINICAL IMPRESSION(S) / ED DIAGNOSES   Final diagnoses:  Symptomatic anemia     Rx / DC Orders   ED Discharge Orders     None        Note:  This document was prepared using Dragon  voice recognition software and may include unintentional dictation errors.    Jacolyn Pae, MD 03/19/24 [phone]

## 2024-03-18 NOTE — Assessment & Plan Note (Signed)
 Sliding scale insulin  coverage

## 2024-03-18 NOTE — Assessment & Plan Note (Signed)
Nephrology consult for continuation of dialysis 

## 2024-03-18 NOTE — ED Triage Notes (Signed)
 First Nurse Note: Patient to ED via [facility] from home for abnormal labs- hgb 5.9. Had dialysis yesterday. Hx diabetes, bilateral AKA. VS WNL

## 2024-03-18 NOTE — Assessment & Plan Note (Addendum)
 Continue amiodarone   Holding apixaban  due to severe anemia of uncertain etiology

## 2024-03-19 DIAGNOSIS — Z1152 Encounter for screening for COVID-19: Secondary | ICD-10-CM | POA: Diagnosis not present

## 2024-03-19 DIAGNOSIS — D519 Vitamin B12 deficiency anemia, unspecified: Secondary | ICD-10-CM | POA: Insufficient documentation

## 2024-03-19 DIAGNOSIS — D513 Other dietary vitamin B12 deficiency anemia: Secondary | ICD-10-CM

## 2024-03-19 DIAGNOSIS — Z89512 Acquired absence of left leg below knee: Secondary | ICD-10-CM | POA: Diagnosis not present

## 2024-03-19 DIAGNOSIS — D631 Anemia in chronic kidney disease: Secondary | ICD-10-CM | POA: Diagnosis present

## 2024-03-19 DIAGNOSIS — I120 Hypertensive chronic kidney disease with stage 5 chronic kidney disease or end stage renal disease: Secondary | ICD-10-CM | POA: Diagnosis present

## 2024-03-19 DIAGNOSIS — E1151 Type 2 diabetes mellitus with diabetic peripheral angiopathy without gangrene: Secondary | ICD-10-CM | POA: Diagnosis present

## 2024-03-19 DIAGNOSIS — E1122 Type 2 diabetes mellitus with diabetic chronic kidney disease: Secondary | ICD-10-CM | POA: Diagnosis present

## 2024-03-19 DIAGNOSIS — Z8249 Family history of ischemic heart disease and other diseases of the circulatory system: Secondary | ICD-10-CM | POA: Diagnosis not present

## 2024-03-19 DIAGNOSIS — N2581 Secondary hyperparathyroidism of renal origin: Secondary | ICD-10-CM | POA: Diagnosis present

## 2024-03-19 DIAGNOSIS — E785 Hyperlipidemia, unspecified: Secondary | ICD-10-CM | POA: Diagnosis present

## 2024-03-19 DIAGNOSIS — Z888 Allergy status to other drugs, medicaments and biological substances status: Secondary | ICD-10-CM | POA: Diagnosis not present

## 2024-03-19 DIAGNOSIS — I2510 Atherosclerotic heart disease of native coronary artery without angina pectoris: Secondary | ICD-10-CM | POA: Diagnosis present

## 2024-03-19 DIAGNOSIS — Z992 Dependence on renal dialysis: Secondary | ICD-10-CM | POA: Diagnosis not present

## 2024-03-19 DIAGNOSIS — I484 Atypical atrial flutter: Secondary | ICD-10-CM | POA: Diagnosis present

## 2024-03-19 DIAGNOSIS — Z7901 Long term (current) use of anticoagulants: Secondary | ICD-10-CM | POA: Diagnosis not present

## 2024-03-19 DIAGNOSIS — N186 End stage renal disease: Secondary | ICD-10-CM | POA: Diagnosis present

## 2024-03-19 DIAGNOSIS — E66812 Obesity, class 2: Secondary | ICD-10-CM | POA: Diagnosis present

## 2024-03-19 DIAGNOSIS — Z91041 Radiographic dye allergy status: Secondary | ICD-10-CM | POA: Diagnosis not present

## 2024-03-19 DIAGNOSIS — Z833 Family history of diabetes mellitus: Secondary | ICD-10-CM | POA: Diagnosis not present

## 2024-03-19 DIAGNOSIS — Z7984 Long term (current) use of oral hypoglycemic drugs: Secondary | ICD-10-CM | POA: Diagnosis not present

## 2024-03-19 DIAGNOSIS — D649 Anemia, unspecified: Principal | ICD-10-CM

## 2024-03-19 DIAGNOSIS — Z79899 Other long term (current) drug therapy: Secondary | ICD-10-CM | POA: Diagnosis not present

## 2024-03-19 DIAGNOSIS — I252 Old myocardial infarction: Secondary | ICD-10-CM | POA: Diagnosis not present

## 2024-03-19 LAB — GLUCOSE, CAPILLARY
Glucose-Capillary: 132 mg/dL — ABNORMAL HIGH (ref 70–99)
Glucose-Capillary: 116 mg/dL — ABNORMAL HIGH (ref 70–99)
Glucose-Capillary: 153 mg/dL — ABNORMAL HIGH (ref 70–99)
Glucose-Capillary: 171 mg/dL — ABNORMAL HIGH (ref 70–99)

## 2024-03-19 LAB — RENAL FUNCTION PANEL
Albumin: 2.9 g/dL — ABNORMAL LOW (ref 3.5–5.0)
Anion gap: 15 (ref 5–15)
BUN: 44 mg/dL — ABNORMAL HIGH (ref 8–23)
CO2: 24 mmol/L (ref 22–32)
Calcium: 8.9 mg/dL (ref 8.9–10.3)
Chloride: 103 mmol/L (ref 98–111)
Creatinine, Ser: 5.68 mg/dL — ABNORMAL HIGH (ref 0.44–1.00)
GFR, Estimated: 7 mL/min — ABNORMAL LOW (ref >60)
Glucose, Bld: 115 mg/dL — ABNORMAL HIGH (ref 70–99)
Phosphorus: 4.9 mg/dL — ABNORMAL HIGH (ref 2.5–4.6)
Potassium: 4.8 mmol/L (ref 3.5–5.1)
Sodium: 142 mmol/L (ref 135–145)

## 2024-03-19 LAB — HEPATITIS B SURFACE ANTIGEN: Hepatitis B Surface Ag: NONREACTIVE

## 2024-03-19 LAB — HEMOGLOBIN
Hemoglobin: 7.2 g/dL — ABNORMAL LOW (ref 12.0–15.0)
Hemoglobin: 6.5 g/dL — ABNORMAL LOW (ref 12.0–15.0)
Hemoglobin: 9 g/dL — ABNORMAL LOW (ref 12.0–15.0)

## 2024-03-19 LAB — VITAMIN B12: Vitamin B-12: <150 pg/mL — ABNORMAL LOW (ref 180–914)

## 2024-03-19 MED ORDER — SENNOSIDES-DOCUSATE SODIUM 8.6-50 MG PO TABS
2.0000 | ORAL_TABLET | Freq: Every day | ORAL | Status: DC
  Administered 2024-03-19: 2 via ORAL
  Filled 2024-03-19: qty 2

## 2024-03-19 MED ORDER — CYANOCOBALAMIN 1000 MCG/ML IJ SOLN
1000.0000 ug | Freq: Every day | INTRAMUSCULAR | Status: DC
Start: 2024-03-19 — End: 2024-03-23
  Administered 2024-03-19 – 2024-03-20 (×2): 1000 ug via INTRAMUSCULAR
  Filled 2024-03-19 (×2): qty 1

## 2024-03-19 MED ORDER — HEPARIN SODIUM (PORCINE) 1000 UNIT/ML IJ SOLN
INTRAMUSCULAR | Status: AC
  Filled 2024-03-19: qty 6

## 2024-03-19 MED ORDER — SODIUM CHLORIDE 0.9% IV SOLUTION: Freq: Once | INTRAVENOUS | Status: DC

## 2024-03-19 MED ORDER — GUAIFENESIN 100 MG/5ML PO LIQD
5.0000 mL | ORAL | Status: DC | PRN
  Administered 2024-03-19: 5 mL via ORAL
  Filled 2024-03-19: qty 10

## 2024-03-19 MED ORDER — CHLORHEXIDINE GLUCONATE CLOTH 2 % EX PADS
6.0000 | MEDICATED_PAD | Freq: Every day | CUTANEOUS | Status: DC
  Administered 2024-03-19 – 2024-03-20 (×2): 6 via TOPICAL

## 2024-03-19 NOTE — Progress Notes (Signed)
 Patient admitted to the unit,patient is alert and oriented x 4,no acute distress.PIV to right hand saline lock.Patient received 1 unit PRBC in the ED.Plan of care discussed with patient.Safety measures maintained

## 2024-03-19 NOTE — Progress Notes (Signed)
  Progress Note   Patient: Amy Wu FMW:[id number] DOB: 1950/07/25 DOA: 03/18/2024     0 DOS: the patient was seen and examined on 03/19/2024   Brief hospital course: Amy Wu is a 73 y.o. female with medical history significant for ESRD on HD  (MWF schedule), CAD (severe RCA disease on LHC 09/2023) DM,, PVD, hospitalized April 2025 for fluid overload (4/12 to 10/12/2023) complicated by septic shock related to Serratia bacteremia from HD catheter, developing new onset atrial flutter now on apixaban  and amiodarone , being admitted with severe anemia, hemoglobin at 5.5 at dialysis today.  Patient was seen by GI, received 2 units of PRBC.   Principal Problem:   Severe anemia Active Problems:   CAD  with severe RCA disease on [facility] 09/2023   ESRD on dialysis [facility])   Atypical atrial flutter (HCC)   Type 2 diabetes mellitus (HCC)   History of serratia bacteremia/septic shock 09/2023   Symptomatic anemia   Class 2 obesity   Assessment and Plan: * Severe anemia B12 deficient anemia Hemoglobin 5.7 down from 8.7 a few months prior Patient has received 2 units of PRBC, last unit was before hemodialysis today.  Recheck hemoglobin again after dialysis.  Monitor hemoglobin. Patient also has B12 level of less than 150, started injection of B12 x 4 days.   CAD  with severe RCA disease on [facility] 09/2023 Patient has no symptoms of recurrence.  Continue home medicines.   ESRD on dialysis [facility]) Nephrology consult for continuation of dialysis   Atypical atrial flutter (HCC) Continue amiodarone   Holding apixaban  due to severe anemia of uncertain etiology   Type 2 diabetes mellitus (HCC) Sliding scale insulin  coverage   History of serratia bacteremia/septic shock 09/2023 No acute issues suspected      Subjective:  Patient doing better today, denies any short of breath or cough.  Physical Exam: Vitals:   03/19/24 1100 03/19/24 1108 03/19/24 1130 03/19/24 1200  BP: (!) 172/71 (!) 181/73 (!)  178/72 (!) 178/69  Pulse: (!) 58  (!) 58 (!) 59  Resp: 19  19 18   Temp: 98.3 F (36.8 C) 97.9 F (36.6 C)    TempSrc: Oral Oral    SpO2: 100% 100% 100% 97%  Weight:       General exam: Appears calm and comfortable, obese Respiratory system: Clear to auscultation. Respiratory effort normal. Cardiovascular system: S1 & S2 heard, RRR. No JVD, murmurs, rubs, gallops or clicks. No pedal edema. Gastrointestinal system: Abdomen is nondistended, soft and nontender. No organomegaly or masses felt. Normal bowel sounds heard. Central nervous system: Alert and oriented. No focal neurological deficits. Extremities: Symmetric 5 x 5 power. Skin: No rashes, lesions or ulcers Psychiatry: Judgement and insight appear normal. Mood & affect appropriate.    Data Reviewed:  Lab results reviewed.  Family Communication: None  Disposition: Status is: Inpatient Remains inpatient appropriate because: Severity of disease,     Time spent: 35 minutes  Author: Murvin Mana, MD 03/19/2024 12:58 PM  For on call review [email or web address].

## 2024-03-19 NOTE — Plan of Care (Signed)

## 2024-03-19 NOTE — Progress Notes (Signed)
 Hemodialysis Note:  Received patient in bed to unit. Alert and oriented. Informed consent singed and in chart.  Treatment initiated: 0945 Treatment completed: 1330  Access used: Left femoral catheter Access issues: None  1 unit of blood was given during treatment. No transfusion reaction noted. Patient tolerated well. Transported back to room, alert without acute distress. Report given to patient's RN.  Total UF removed:  Medications given: Calcitriol  0.25 mcg tablet  Post HD weight: 87.3 Kg  [name]

## 2024-03-19 NOTE — Progress Notes (Signed)
 Pt leaving unit at this time for hemodialysis.  Consent signed.  Pt requesting to eat.  Message sent to Laurita, MD, reference same.  Dialysis RN made aware of need for 2nd unit of PRBCs during dialysis during report.

## 2024-03-19 NOTE — Consult Note (Signed)
 Amy Copping, MD [facility]  [address] [place], [place] [address] Phone: [phone] Fax : [phone]  Consultation  Referring Provider:     Dr. Cleatus Primary Care Physician:  Amy Wu, Amy BRAVO, DO Primary Gastroenterologist: [facility]         Reason for Consultation:     Symptomatic anemia  Date of Admission:  03/18/2024 Date of Consultation:  03/19/2024       Preconsult chart review  HPI:   Amy Wu is a 73 y.o. female who has a history of end-stage renal disease on hemodialysis.  The patient also suffers from peripheral vascular disease, atrial flutter on Eliquis  with hyperlipidemia and diabetes.  The patient was admitted to the emergency department with a hemoglobin of 5.9 with the report of feeling short of breath with weakness and being tired. The patient has a history of hospitalization back in April for Serratia bacteremia with septic shock from May hemodialysis catheter with new onset atrial flutter at that time.  The patient went for dialysis and was found to have a hemoglobin of 5.5 and was sent to the emergency department.  On presentation the patient denied any black stools or bloody stools.  The patient's most recent hemoglobins in the chart have shown:  Component     Latest Ref Rng 10/10/2023 10/12/2023 03/18/2024 03/19/2024  Hemoglobin     12.0 - 15.0 g/dL 8.9 (L)  8.7 (L)  5.7 (L)  7.2 (L)    The patient underwent a colonoscopy on Oct 26, 2020 at [facility] that showed a normal colon without any specimens removed.  The patient's iron studies showed:  Component     Latest Ref Rng 03/18/2024  Iron     28 - 170 ug/dL 38   TIBC     749 - 549 ug/dL 866 (L)   Saturation Ratios     10.4 - 31.8 % 29   UIBC     ug/dL 95    In addition to not having signs of iron deficiency anemia the patient's ferritin was 810.  The patient's reticulocyte count was elevated at 25%.  The patient's folate was normal while the patient's B12 was less than 150 indicating  significant deficiency.    Past Medical History:  Diagnosis Date   Arthritis    Atypical atrial flutter (HCC)    Chronic kidney disease    Complication of anesthesia    Diabetes mellitus without complication (HCC)    Hyperlipidemia    NSTEMI (non-ST elevated myocardial infarction) (HCC) 10/2023   Peripheral vascular disease     Past Surgical History:  Procedure Laterality Date   A/V SHUNT INTERVENTION Left 11/20/2019   Procedure: A/V SHUNT INTERVENTION (Left Thigh);  Surgeon: Marea Selinda RAMAN, MD;  Location: [facility];  Service: Cardiovascular;  Laterality: Left;   A/V SHUNTOGRAM Left 02/16/2017   Procedure: A/V Shuntogram;  Surgeon: Marea Selinda RAMAN, MD;  Location: [facility];  Service: Cardiovascular;  Laterality: Left;   A/V SHUNTOGRAM Left 12/13/2017   Procedure: A/V SHUNTOGRAM;  Surgeon: Marea Selinda RAMAN, MD;  Location: [facility];  Service: Cardiovascular;  Laterality: Left;   A/V SHUNTOGRAM Left 12/05/2018   Procedure: A/V SHUNTOGRAM;  Surgeon: Marea Selinda RAMAN, MD;  Location: [facility];  Service: Cardiovascular;  Laterality: Left;   A/V SHUNTOGRAM Left 06/03/2020   Procedure: A/V SHUNTOGRAM;  Surgeon: Marea Selinda RAMAN, MD;  Location: [facility];  Service: Cardiovascular;  Laterality: Left;   A/V SHUNTOGRAM Left 05/24/2021   Procedure: A/V SHUNTOGRAM;  Surgeon: Jama Cordella MATSU, MD;  Location: [facility];  Service: Cardiovascular;  Laterality: Left;  Thigh Graft   ABOVE KNEE LEG AMPUTATION     AV FISTULA PLACEMENT Bilateral    AV FISTULA PLACEMENT Left 07/01/2015   Procedure: INSERTION OF ARTERIOVENOUS (AV) GRAFT THIGH (ARTEGRAFT);  Surgeon: Selinda GORMAN Gu, MD;  Location: [facility];  Service: Vascular;  Laterality: Left;   BELOW KNEE LEG AMPUTATION Left    BREAST BIOPSY     CATARACT EXTRACTION W/PHACO Left 07/10/2022   Procedure: CATARACT EXTRACTION PHACO AND INTRAOCULAR LENS PLACEMENT (IOC) LEFT DIABETIC  12.98  01:13.7;  Surgeon: Myrna Adine Anes, MD;  [facility];  Service: Ophthalmology;  Laterality: Left;   DIALYSIS/PERMA CATHETER INSERTION N/A 05/26/2021   Procedure: DIALYSIS/PERMA CATHETER INSERTION;  Surgeon: Gu Selinda GORMAN, MD;  Location: [facility];  Service: Cardiovascular;  Laterality: N/A;   DIALYSIS/PERMA CATHETER INSERTION N/A 07/05/2022   Procedure: DIALYSIS/PERMA CATHETER INSERTION;  Surgeon: Gu Selinda GORMAN, MD;  Location: [facility];  Service: Cardiovascular;  Laterality: N/A;   DIALYSIS/PERMA CATHETER INSERTION N/A 07/28/2022   Procedure: DIALYSIS/PERMA CATHETER INSERTION;  Surgeon: Gu Selinda GORMAN, MD;  Location: [facility];  Service: Cardiovascular;  Laterality: N/A;   DIALYSIS/PERMA CATHETER INSERTION N/A 09/21/2022   Procedure: DIALYSIS/PERMA CATHETER INSERTION;  Surgeon: Gu Selinda GORMAN, MD;  Location: [facility];  Service: Cardiovascular;  Laterality: N/A;   DIALYSIS/PERMA CATHETER INSERTION N/A 02/15/2023   Procedure: DIALYSIS/PERMA CATHETER INSERTION;  Surgeon: Gu Selinda GORMAN, MD;  Location: [facility];  Service: Cardiovascular;  Laterality: N/A;   DIALYSIS/PERMA CATHETER INSERTION N/A 04/09/2023   Procedure: DIALYSIS/PERMA CATHETER INSERTION;  Surgeon: Jama Cordella MATSU, MD;  Location: [facility];  Service: Cardiovascular;  Laterality: N/A;   DIALYSIS/PERMA CATHETER INSERTION N/A 10/05/2023   Procedure: DIALYSIS/PERMA CATHETER INSERTION;  Surgeon: Gu Selinda GORMAN, MD;  Location: [facility];  Service: Cardiovascular;  Laterality: N/A;   DIALYSIS/PERMA CATHETER INSERTION N/A 11/01/2023   Procedure: DIALYSIS/PERMA CATHETER INSERTION;  Surgeon: Gu Selinda GORMAN, MD;  Location: [facility];  Service: Cardiovascular;  Laterality: N/A;   DIALYSIS/PERMA CATHETER REPAIR Right 09/24/2023   Procedure: DIALYSIS/PERMA CATHETER REPAIR;  Surgeon: Jama Cordella MATSU, MD;  Location: [facility];  Service: Cardiovascular;  Laterality: Right;    DIALYSIS/PERMA CATHETER REPAIR Left 01/21/2024   Procedure: DIALYSIS/PERMA CATHETER REPAIR;  Surgeon: Gu Selinda GORMAN, MD;  Location: [facility];  Service: Cardiovascular;  Laterality: Left;   ENDARTERECTOMY FEMORAL Left 07/01/2015   Procedure: SUPERFICIAL FEMORAL ENDARTERECTOMY ;  Surgeon: Selinda GORMAN Gu, MD;  Location: [facility];  Service: Vascular;  Laterality: Left;   EXCHANGE OF A DIALYSIS CATHETER  02/11/2015   Procedure: Exchange Of A Dialysis Catheter;  Surgeon: Selinda GORMAN Gu, MD;  Location: [facility] INV[facility]IVE CV LAB;  Service: Cardiovascular;;   LEFT HEART CATH AND CORONARY ANGIOGRAPHY N/A 10/04/2023   Procedure: LEFT HEART CATH AND CORONARY ANGIOGRAPHY;  Surgeon: Anner Alm ORN, MD;  Location: [facility];  Service: Cardiovascular;  Laterality: N/A;   LEG AMPUTATION ABOVE KNEE Right    PERIPHERAL VASCULAR CATHETERIZATION N/A 02/11/2015   Procedure: Dialysis/Perma Catheter Insertion;  Surgeon: Selinda GORMAN Gu, MD;  Location: [facility];  Service: Cardiovascular;  Laterality: N/A;   PERIPHERAL VASCULAR CATHETERIZATION N/A 05/11/2015   Procedure: Dialysis/Perma  Catheter Insertion, exchange;  Surgeon: Cordella KANDICE Shawl, MD;  Location: [facility];  Service: Cardiovascular;  Laterality: N/A;   PERIPHERAL VASCULAR CATHETERIZATION N/A 05/18/2015   Procedure: Dialysis/Perma Catheter Insertion;  Surgeon: Cordella KANDICE Shawl, MD;  Location: [facility];  Service: Cardiovascular;  Laterality: N/A;   PERIPHERAL VASCULAR CATHETERIZATION Left 07/19/2015   Procedure: A/V Shuntogram/Fistulagram;  Surgeon: Selinda GORMAN Gu, MD;  Location: [facility];  Service: Cardiovascular;  Laterality: Left;   PERIPHERAL VASCULAR CATHETERIZATION N/A 07/19/2015   Procedure: A/V Shunt Intervention;  Surgeon: Selinda GORMAN Gu, MD;  Location: [facility];  Service: Cardiovascular;  Laterality: N/A;   PERIPHERAL VASCULAR CATHETERIZATION N/A 08/26/2015   Procedure: Dialysis/Perma Catheter  Removal;  Surgeon: Selinda GORMAN Gu, MD;  Location: [facility];  Service: Cardiovascular;  Laterality: N/A;   PERIPHERAL VASCULAR THROMBECTOMY N/A 12/29/2016   Procedure: Peripheral Vascular Thrombectomy;  Surgeon: Shawl Cordella KANDICE, MD;  Location: [facility];  Service: Cardiovascular;  Laterality: N/A;    Prior to Admission medications   Medication Sig Start Date End Date Taking? Authorizing Provider  acetaminophen  (TYLENOL ) 500 MG tablet Take 1,000 mg by mouth every 8 (eight) hours as needed for moderate pain.    [provider]  amiodarone  (PACERONE ) 200 MG tablet Take 1 tablet (200 mg total) by mouth daily. 12/25/23   Gollan, Timothy J, MD  apixaban  (ELIQUIS ) 5 MG TABS tablet Take 1 tablet (5 mg total) by mouth 2 (two) times daily. 12/25/23   Gollan, Timothy J, MD  atorvastatin  (LIPITOR) 40 MG tablet Take 1 tablet (40 mg total) by mouth daily. 12/25/23   Gollan, Timothy J, MD  calcitRIOL  (ROCALTROL ) 0.25 MCG capsule Take 0.25 mcg by mouth daily. Take 3 capsules by mouth three times week at dialysis    [provider]  cinacalcet  (SENSIPAR ) 30 MG tablet Take 30 mg by mouth daily with supper.  Patient not taking: Reported on 12/25/2023    [provider]  COMFORT EZ PEN NEEDLES 31G X 6 MM MISC Inject into the skin. 03/17/21   [provider]  ferrous sulfate  325 (65 FE) MG tablet Take 325 mg by mouth every other day. Patient not taking: Reported on 12/25/2023    [provider]  fluticasone  (FLONASE ) 50 MCG/ACT nasal spray Place 1 spray into both nostrils daily.    [provider]  gabapentin  (NEURONTIN ) 100 MG capsule Take 100 mg by mouth 3 (three) times daily as needed. M/W/F after dialysis PRN for leg/thigh cramps    [provider]  glucose 4 GM chewable tablet Chew 1 tablet by mouth as needed for low blood sugar. If blood sugar less than 70. Chew 3 tablets and recheck blood sugar in 15 minutes    [provider]   glucose blood test strip 1 each by Other route as needed for other. Use as instructed    [provider]  JANUVIA 25 MG tablet Take 25 mg by mouth daily. 11/02/20   [provider]  lanthanum  (FOSRENOL ) 1000 MG chewable tablet Chew 1,000 mg by mouth 2 (two) times daily with a meal.     [provider]  LOKELMA 10 g PACK packet Take 1 packet by mouth as directed. Taken on Sat and Sundays midday Patient not taking: Reported on 12/25/2023 09/25/23   [provider]  loratadine  (CLARITIN ) 10 MG tablet Take 10 mg by mouth daily as needed for allergies.    [provider]  Multiple Vitamins-Minerals (PRESERVISION AREDS 2) CAPS Take 1 capsule by mouth 2 (two) times daily. 09/18/23   [provider]  patiromer (VELTASSA) 8.4 g packet Take 8.4 g by mouth daily.    [provider]  senna-docusate (SENOKOT-S) 8.6-50 MG tablet Take 1-2 tablets by mouth at bedtime as needed for mild constipation or moderate constipation. 1-2 tablets at bedtime if needed    [provider]  STOOL SOFTENER 100 MG capsule Take 100 mg by mouth 2 (two) times daily. 09/18/23   [provider]  tiZANidine  (ZANAFLEX ) 4 MG tablet Take 4 mg by mouth 2 (two) times daily as needed for muscle spasms. 09/25/23   [provider]  TRUEplus Lancets 28G MISC Apply topically. 03/17/21   [provider]  DANYA INSULIN  SYRINGE 31G X 5/16 0.3 ML MISC SMARTSIG:Injection Daily 03/31/21   [provider]    Family History  Problem Relation Age of Onset   Hypertension Mother    Diabetes Mother    Hypertension Father    Diabetes Father    Diabetes Brother    Hypertension Brother    Breast cancer Neg Hx      Social History   Tobacco Use   Smoking status: Never   Smokeless tobacco: Never  Vaping Use   Vaping status: Never Used  Substance Use Topics   Alcohol use: No   Drug use: No    Allergies as of 03/18/2024 - Review Complete  03/18/2024  Allergen Reaction Noted   Cefepime  Other (See Comments) 10/11/2023   Ancef  [cefazolin ] Palpitations 02/16/2017   Contrast media [iodinated contrast media] Palpitations 02/16/2017    Review of Systems:    All systems reviewed and negative except where noted in HPI.   Physical Exam:  Vital signs in last 24 hours: Temp:  [97.3 F (36.3 C)-98.8 F (37.1 C)] 97.3 F (36.3 C) (10/01 0722) Pulse Rate:  [59-69] 59 (10/01 0826) Resp:  [12-20] 16 (10/01 0722) BP: (104-166)/(47-87) 155/60 (10/01 0826) SpO2:  [91 %-100 %] 100 % (10/01 0826) Weight:  [89.1 kg] 89.1 kg (10/01 0500) Last BM Date : 03/17/24 General:   Pleasant, cooperative in NAD Head:  Normocephalic and atraumatic. Eyes:   No icterus.   Conjunctiva pink. PERRLA. Ears:  Normal auditory acuity. Neck:  Supple; no masses or thyroidomegaly Lungs: Respirations even and unlabored. Lungs clear to auscultation bilaterally.   No wheezes, crackles, or rhonchi.  Heart:  Regular rate and rhythm;  Without murmur, clicks, rubs or gallops Abdomen:  Soft, nondistended, nontender. Normal bowel sounds. No appreciable masses or hepatomegaly.  No rebound or guarding.  Rectal:  Not performed. Msk:  Symmetrical without gross deformities.  Extremities: Bilateral AKA. Neurologic:  Alert and oriented x3;  grossly normal neurologically. Skin:  Intact without significant lesions or rashes. Cervical Nodes:  No significant cervical adenopathy. Psych:  Alert and cooperative. Normal affect.  LAB RESULTS: Recent Labs    03/18/24 1844 03/19/24 0106  WBC 4.5  --   HGB 5.7* 7.2*  HCT 18.1*  --   PLT 127*  --    BMET Recent Labs    03/18/24 1844  NA 141  K 3.7  CL 106  CO2 23  GLUCOSE 136*  BUN 39*  CREATININE 4.96*  CALCIUM  8.9   LFT Recent Labs    03/18/24 1844  PROT 6.1*  ALBUMIN 3.2*  AST 19  ALT 19  ALKPHOS 84  BILITOT 0.5   PT/INR No results for input(s):  LABPROT, INR in the last 72 hours.  STUDIES: No  results found.    Impression / Plan:   Assessment: Principal Problem:   Severe anemia Active Problems:   Type 2 diabetes mellitus (HCC)   ESRD on dialysis [facility])   Atypical atrial flutter (HCC)   CAD  with severe RCA disease on [facility] 09/2023   History of serratia bacteremia/septic shock 09/2023   Amy Wu is a 73 y.o. y/o female with symptomatic anemia with the patient having normal iron studies and increased reticulocyte count with renal failure on dialysis and a profoundly low B12.  The patient denies any sign of any GI bleeding black stools or bloody stools.  Plan:  This patient likely has a multifactorial cause of anemia including anemia of chronic disease with B12 deficiency.  The patient's iron studies are normal and the patient had a colonoscopy that was normal at [facility] in 2022.  The patient does not have any signs of any GI bleeding and no further GI workup will be undertaken at this time.  If the patient does not respond to B12 treatment and continues to be anemic then I recommend the patient seeing hematology for a more complete workup and if GI bleeding is then suspected a GI workup can be done as an outpatient.   I will sign off.  Please call if any further GI concerns or questions.  We would like to thank you for the opportunity to participate in the care of Amy Wu.   Thank you for involving me in the care of this patient.      LOS: 0 days   Amy Copping, MD, MD. NOLIA 03/19/2024, 8:36 AM,  Pager [phone] 7am-5pm  Check AMION for 5pm -7am coverage and on weekends   Note: This dictation was prepared with Dragon dictation along with smaller phrase technology. Any transcriptional errors that result from this process are unintentional.

## 2024-03-19 NOTE — Hospital Course (Signed)
 Amy Wu is a 73 y.o. female with medical history significant for ESRD on HD  (MWF schedule), CAD (severe RCA disease on [facility] 09/2023) DM,, PVD, hospitalized April 2025 for fluid overload (4/12 to 10/12/2023) complicated by septic shock related to Serratia bacteremia from HD catheter, developing new onset atrial flutter now on apixaban  and amiodarone , being admitted with severe anemia, hemoglobin at 5.5 at dialysis today.  Patient was seen by GI, received 2 units of PRBC.

## 2024-03-19 NOTE — Progress Notes (Signed)
 Pt receives outpt HD at [facility]/MWF. Navigator following to assist with any HD needs.  Suzen Satchel Dialysis Navigator [phone]

## 2024-03-19 NOTE — Progress Notes (Signed)
 [facility]  ROUNDING NOTE   Subjective:   Amy Wu is a 73 y.o. female with past medical history of hypertension, diabetes, PVD, dyslipidemia, and end-stage renal disease on dialysis.  Patient presents to the emergency department with abnormal labs and has been admitted for Severe anemia [D64.9]  Patient is known to our practice and receives outpatient dialysis treatments at [facility] on a MWF schedule, supervised by [facility] physicians.  Last outpatient treatment completed on Monday.  Patient seen and evaluated during dialysis.   HEMODIALYSIS FLOWSHEET:  Blood Flow Rate (mL/min): 349 mL/min Arterial Pressure (mmHg): -206.45 mmHg Venous Pressure (mmHg): 167.87 mmHg TMP (mmHg): 12.73 mmHg Ultrafiltration Rate (mL/min): 444 mL/min Dialysate Flow Rate (mL/min): 299 ml/min  Patient seen lying in bed.  Currently drowsy but easily aroused for short periods.  Denies pain or discomfort.  Does report increased weakness over the past day or so.  Denies known bloody stools.  Labs on ED arrival concerning for hemoglobin 5.7, reportedly 5.9 outpatient.  Patient did receive 1 unit blood transfusion in emergency department yesterday with hemoglobin 7.2 afterwards.  However during dialysis, hemoglobin 6.5, patient receiving 1 unit during treatment.  We have been consulted to manage dialysis needs during this admission.   Objective:  Vital signs in last 24 hours:  Temp:  [97.3 F (36.3 C)-98.8 F (37.1 C)] 97.9 F (36.6 C) (10/01 1108) Pulse Rate:  [56-69] 58 (10/01 1300) Resp:  [9-20] 15 (10/01 1300) BP: (104-181)/(47-87) 165/61 (10/01 1300) SpO2:  [91 %-100 %] 99 % (10/01 1300) Weight:  [87.3 kg-89.1 kg] 87.3 kg (10/01 0923)  Weight change:  Filed Weights   03/19/24 0500 03/19/24 0923  Weight: 89.1 kg 87.3 kg    Intake/Output: I/O last 3 completed shifts: In: 338.8 [Blood:338.8] Out: -    Intake/Output this shift:  Total I/O In: 415 [Blood:415] Out: -    Physical Exam: General: NAD  Head: Normocephalic, atraumatic. Moist oral mucosal membranes  Eyes: Anicteric  Lungs:  Clear to auscultation, normal effort  Heart: Regular rate and rhythm  Abdomen:  Soft, nontender  Extremities: No peripheral edema.  Neurologic: Somnolent but arousable  Skin: Warm,dry, no rash  Access: Right thigh PermCath    Basic Metabolic Panel: Recent Labs  Lab 03/18/24 1844 03/19/24 0945  NA 141 142  K 3.7 4.8  CL 106 103  CO2 23 24  GLUCOSE 136* 115*  BUN 39* 44*  CREATININE 4.96* 5.68*  CALCIUM  8.9 8.9  PHOS  --  4.9*    Liver Function Tests: Recent Labs  Lab 03/18/24 1844 03/19/24 0945  AST 19  --   ALT 19  --   ALKPHOS 84  --   BILITOT 0.5  --   PROT 6.1*  --   ALBUMIN 3.2* 2.9*   No results for input(s): LIPASE, AMYLASE in the last 168 hours. No results for input(s): AMMONIA in the last 168 hours.  CBC: Recent Labs  Lab 03/18/24 1844 03/19/24 0106 03/19/24 1010  WBC 4.5  --   --   NEUTROABS 2.5  --   --   HGB 5.7* 7.2* 6.5*  HCT 18.1*  --   --   MCV 83.8  --   --   PLT 127*  --   --     Cardiac Enzymes: No results for input(s): CKTOTAL, CKMB, CKMBINDEX, TROPONINI in the last 168 hours.  BNP: Invalid input(s): POCBNP  CBG: Recent Labs  Lab 03/18/24 2335 03/19/24 0424 03/19/24 9148  GLUCAP 133* 132* 116*    Microbiology: Results for orders placed or performed during the hospital encounter of 10/01/23  Resp panel by RT-PCR (RSV, Flu A&B, Covid) Anterior Nasal Swab     Status: None   Collection Time: 10/01/23  9:12 AM   Specimen: Anterior Nasal Swab  Result Value Ref Range Status   SARS Coronavirus 2 by RT PCR NEGATIVE NEGATIVE Final    Comment: (NOTE) SARS-CoV-2 target nucleic acids are NOT DETECTED.  The SARS-CoV-2 RNA is generally detectable in upper respiratory specimens during the acute phase of infection. The lowest concentration of SARS-CoV-2 viral copies this assay can detect is 138  copies/mL. A negative result does not preclude SARS-Cov-2 infection and should not be used as the sole basis for treatment or other patient management decisions. A negative result may occur with  improper specimen collection/handling, submission of specimen other than nasopharyngeal swab, presence of viral mutation(s) within the areas targeted by this assay, and inadequate number of viral copies(<138 copies/mL). A negative result must be combined with clinical observations, patient history, and epidemiological information. The expected result is Negative.  Fact Sheet for Patients:  BloggerCourse.com  Fact Sheet for Healthcare Providers:  SeriousBroker.it  This test is no t yet approved or cleared by the [place]  FDA and  has been authorized for detection and/or diagnosis of SARS-CoV-2 by FDA under an Emergency Use Authorization (EUA). This EUA will remain  in effect (meaning this test can be used) for the duration of the COVID-19 declaration under Section 564(b)(1) of the Act, 21 U.S.C.section 360bbb-3(b)(1), unless the authorization is terminated  or revoked sooner.       Influenza A by PCR NEGATIVE NEGATIVE Final   Influenza B by PCR NEGATIVE NEGATIVE Final    Comment: (NOTE) The Xpert Xpress SARS-CoV-2/FLU/RSV plus assay is intended as an aid in the diagnosis of influenza from Nasopharyngeal swab specimens and should not be used as a sole basis for treatment. Nasal washings and aspirates are unacceptable for Xpert Xpress SARS-CoV-2/FLU/RSV testing.  Fact Sheet for Patients: BloggerCourse.com  Fact Sheet for Healthcare Providers: SeriousBroker.it  This test is not yet approved or cleared by the [place]  FDA and has been authorized for detection and/or diagnosis of SARS-CoV-2 by FDA under an Emergency Use Authorization (EUA). This EUA will remain in effect (meaning  this test can be used) for the duration of the COVID-19 declaration under Section 564(b)(1) of the Act, 21 U.S.C. section 360bbb-3(b)(1), unless the authorization is terminated or revoked.     Resp Syncytial Virus by PCR NEGATIVE NEGATIVE Final    Comment: (NOTE) Fact Sheet for Patients: BloggerCourse.com  Fact Sheet for Healthcare Providers: SeriousBroker.it  This test is not yet approved or cleared by the [place]  FDA and has been authorized for detection and/or diagnosis of SARS-CoV-2 by FDA under an Emergency Use Authorization (EUA). This EUA will remain in effect (meaning this test can be used) for the duration of the COVID-19 declaration under Section 564(b)(1) of the Act, 21 U.S.C. section 360bbb-3(b)(1), unless the authorization is terminated or revoked.  Performed at [facility], [address] [place], [place] [address]   Blood Culture (routine x 2)     Status: Abnormal   Collection Time: 10/01/23  9:12 AM   Specimen: BLOOD  Result Value Ref Range Status   Specimen Description   Final    BLOOD BLOOD LEFT FOREARM Performed at [facility], [address], [place], [place] [address]    Special  Requests   Final    BOTTLES DRAWN AEROBIC AND ANAEROBIC Blood Culture results may not be optimal due to an inadequate volume of blood received in culture bottles Performed at [facility], [address] [place], [place] [address]    Culture  Setup Time   Final    GRAM NEGATIVE RODS IN BOTH AEROBIC AND ANAEROBIC BOTTLES CRITICAL VALUE NOTED.  VALUE IS CONSISTENT WITH PREVIOUSLY REPORTED AND CALLED VALUE. Performed at [facility], [address] [place], [place] [address]    Culture (A)  Final    SERRATIA MARCESCENS SUSCEPTIBILITIES PERFORMED ON PREVIOUS CULTURE WITHIN THE LAST 5 DAYS. Performed at [facility], [address] [place], [place] [address]    Report  Status 10/04/2023 FINAL  Final  Respiratory (~20 pathogens) panel by PCR     Status: None   Collection Time: 10/01/23  9:12 AM   Specimen: Nasopharyngeal Swab; Respiratory  Result Value Ref Range Status   Adenovirus NOT DETECTED NOT DETECTED Final   Coronavirus 229E NOT DETECTED NOT DETECTED Final    Comment: (NOTE) The Coronavirus on the Respiratory Panel, DOES NOT test for the novel  Coronavirus (2019 nCoV)    Coronavirus HKU1 NOT DETECTED NOT DETECTED Final   Coronavirus NL63 NOT DETECTED NOT DETECTED Final   Coronavirus OC43 NOT DETECTED NOT DETECTED Final   Metapneumovirus NOT DETECTED NOT DETECTED Final   Rhinovirus / Enterovirus NOT DETECTED NOT DETECTED Final   Influenza A NOT DETECTED NOT DETECTED Final   Influenza B NOT DETECTED NOT DETECTED Final   Parainfluenza Virus 1 NOT DETECTED NOT DETECTED Final   Parainfluenza Virus 2 NOT DETECTED NOT DETECTED Final   Parainfluenza Virus 3 NOT DETECTED NOT DETECTED Final   Parainfluenza Virus 4 NOT DETECTED NOT DETECTED Final   Respiratory Syncytial Virus NOT DETECTED NOT DETECTED Final   Bordetella pertussis NOT DETECTED NOT DETECTED Final   Bordetella Parapertussis NOT DETECTED NOT DETECTED Final   Chlamydophila pneumoniae NOT DETECTED NOT DETECTED Final   Mycoplasma pneumoniae NOT DETECTED NOT DETECTED Final    Comment: Performed at [facility], [address] [place], [place] [address]  Blood Culture (routine x 2)     Status: Abnormal   Collection Time: 10/01/23  9:33 AM   Specimen: BLOOD  Result Value Ref Range Status   Specimen Description   Final    BLOOD RIGHT WRIST Performed at [facility], [address], [place], [place] [address]    Special Requests   Final    BOTTLES DRAWN AEROBIC AND ANAEROBIC Blood Culture adequate volume Performed at [facility], [address], [place], [place] [address]    Culture  Setup Time   Final    GRAM NEGATIVE RODS IN BOTH AEROBIC AND ANAEROBIC  BOTTLES CRITICAL RESULT CALLED TO, READ BACK BY AND VERIFIED WITH:  NATHAN BELUE AT 0457 10/02/23 JG Performed at [facility], [address], [place], [place] [address]    Culture SERRATIA MARCESCENS (A)  Final   Report Status 10/04/2023 FINAL  Final   Organism ID, Bacteria SERRATIA MARCESCENS  Final      Susceptibility   Serratia marcescens - MIC*    CEFEPIME  <=0.12 SENSITIVE Sensitive     CEFTAZIDIME <=1 SENSITIVE Sensitive     CEFTRIAXONE  <=0.25 SENSITIVE Sensitive     CIPROFLOXACIN  <=0.25 SENSITIVE Sensitive     GENTAMICIN <=1 SENSITIVE Sensitive     TRIMETH /SULFA  <=20 SENSITIVE Sensitive     *  SERRATIA MARCESCENS  Blood Culture ID Panel (Reflexed)     Status: Abnormal   Collection Time: 10/01/23  9:33 AM  Result Value Ref Range Status   Enterococcus faecalis NOT DETECTED NOT DETECTED Final   Enterococcus Faecium NOT DETECTED NOT DETECTED Final   Listeria monocytogenes NOT DETECTED NOT DETECTED Final   Staphylococcus species NOT DETECTED NOT DETECTED Final   Staphylococcus aureus (BCID) NOT DETECTED NOT DETECTED Final   Staphylococcus epidermidis NOT DETECTED NOT DETECTED Final   Staphylococcus lugdunensis NOT DETECTED NOT DETECTED Final   Streptococcus species NOT DETECTED NOT DETECTED Final   Streptococcus agalactiae NOT DETECTED NOT DETECTED Final   Streptococcus pneumoniae NOT DETECTED NOT DETECTED Final   Streptococcus pyogenes NOT DETECTED NOT DETECTED Final   A.calcoaceticus-baumannii NOT DETECTED NOT DETECTED Final   Bacteroides fragilis NOT DETECTED NOT DETECTED Final   Enterobacterales DETECTED (A) NOT DETECTED Final    Comment: Enterobacterales represent a large order of gram negative bacteria, not a single organism. CRITICAL RESULT CALLED TO, READ BACK BY AND VERIFIED WITH:  NATHAN BELUE AT 0457 10/02/23 JG    Enterobacter cloacae complex NOT DETECTED NOT DETECTED Final   Escherichia coli NOT DETECTED NOT DETECTED Final   Klebsiella aerogenes NOT DETECTED  NOT DETECTED Final   Klebsiella oxytoca NOT DETECTED NOT DETECTED Final   Klebsiella pneumoniae NOT DETECTED NOT DETECTED Final   Proteus species NOT DETECTED NOT DETECTED Final   Salmonella species NOT DETECTED NOT DETECTED Final   Serratia marcescens DETECTED (A) NOT DETECTED Final    Comment: CRITICAL RESULT CALLED TO, READ BACK BY AND VERIFIED WITH:  NATHAN BELUE AT 0457 10/02/23 JG    Haemophilus influenzae NOT DETECTED NOT DETECTED Final   Neisseria meningitidis NOT DETECTED NOT DETECTED Final   Pseudomonas aeruginosa NOT DETECTED NOT DETECTED Final   Stenotrophomonas maltophilia NOT DETECTED NOT DETECTED Final   Candida albicans NOT DETECTED NOT DETECTED Final   Candida auris NOT DETECTED NOT DETECTED Final   Candida glabrata NOT DETECTED NOT DETECTED Final   Candida krusei NOT DETECTED NOT DETECTED Final   Candida parapsilosis NOT DETECTED NOT DETECTED Final   Candida tropicalis NOT DETECTED NOT DETECTED Final   Cryptococcus neoformans/gattii NOT DETECTED NOT DETECTED Final   CTX-M ESBL NOT DETECTED NOT DETECTED Final   Carbapenem resistance IMP NOT DETECTED NOT DETECTED Final   Carbapenem resistance KPC NOT DETECTED NOT DETECTED Final   Carbapenem resistance NDM NOT DETECTED NOT DETECTED Final   Carbapenem resist OXA 48 LIKE NOT DETECTED NOT DETECTED Final   Carbapenem resistance VIM NOT DETECTED NOT DETECTED Final    Comment: Performed at [facility], [address] [place], [place] [address]  MRSA Next Gen by PCR, Nasal     Status: None   Collection Time: 10/02/23  5:26 PM   Specimen: Nasal Mucosa; Nasal Swab  Result Value Ref Range Status   MRSA by PCR Next Gen NOT DETECTED NOT DETECTED Final    Comment: (NOTE) The GeneXpert MRSA Assay (FDA approved for NASAL specimens only), is one component of a comprehensive MRSA colonization surveillance program. It is not intended to diagnose MRSA infection nor to guide or monitor treatment for MRSA infections. Test  performance is not FDA approved in patients less than 68 years old. Performed at [facility], [address], [place], [place] [address]   Cath Tip Culture     Status: Abnormal   Collection Time: 10/02/23  9:28 PM   Specimen: Catheter Tip; Other  Result Value Ref Range Status   Specimen Description   Final    CATH TIP Performed at [facility], [address] [place], [place] [address]    Special Requests   Final    NONE Performed at [facility], [address] [place], [place] [address]    Culture >=100,000 COLONIES/mL SERRATIA MARCESCENS (A)  Final   Report Status 10/08/2023 FINAL  Final   Organism ID, Bacteria SERRATIA MARCESCENS (A)  Final      Susceptibility   Serratia marcescens - MIC*    CEFEPIME  <=0.12 SENSITIVE Sensitive     CEFTAZIDIME <=1 SENSITIVE Sensitive     CEFTRIAXONE  <=0.25 SENSITIVE Sensitive     CIPROFLOXACIN  <=0.25 SENSITIVE Sensitive     GENTAMICIN <=1 SENSITIVE Sensitive     TRIMETH /SULFA  <=20 SENSITIVE Sensitive     * >=100,000 COLONIES/mL SERRATIA MARCESCENS  Culture, blood (Routine X 2) w Reflex to ID Panel     Status: None   Collection Time: 10/03/23  1:00 PM   Specimen: BLOOD  Result Value Ref Range Status   Specimen Description BLOOD BLOOD LEFT WRIST  Final   Special Requests   Final    BOTTLES DRAWN AEROBIC ONLY Blood Culture adequate volume   Culture   Final    NO GROWTH 5 DAYS Performed at [facility], [address] [place], [place] [address]    Report Status 10/08/2023 FINAL  Final  Culture, blood (Routine X 2) w Reflex to ID Panel     Status: None   Collection Time: 10/03/23  1:04 PM   Specimen: BLOOD  Result Value Ref Range Status   Specimen Description BLOOD BLOOD LEFT FOREARM  Final   Special Requests   Final    BOTTLES DRAWN AEROBIC ONLY Blood Culture adequate volume   Culture   Final    NO GROWTH 5 DAYS Performed at [facility], [address] [place], [place] [address]     Report Status 10/08/2023 FINAL  Final    Coagulation Studies: No results for input(s): LABPROT, INR in the last 72 hours.  Urinalysis: No results for input(s): COLORURINE, LABSPEC, PHURINE, GLUCOSEU, HGBUR, BILIRUBINUR, KETONESUR, PROTEINUR, UROBILINOGEN, NITRITE, LEUKOCYTESUR in the last 72 hours.  Invalid input(s): APPERANCEUR    Imaging: No results found.   Medications:    sodium chloride      sodium chloride       sodium chloride    Intravenous Once   amiodarone   200 mg Oral Daily   atorvastatin   40 mg Oral Daily   calcitRIOL   0.25 mcg Oral Q M,W,F-HD   Chlorhexidine  Gluconate Cloth  6 each Topical Daily   cyanocobalamin  1,000 mcg Intramuscular Daily   insulin  aspart  0-6 Units Subcutaneous Q4H   senna-docusate  2 tablet Oral QHS   acetaminophen  **OR** acetaminophen , gabapentin , HYDROcodone -acetaminophen , HYDROmorphone  (DILAUDID ) injection, ondansetron  **OR** ondansetron  (ZOFRAN ) IV, tiZANidine   Assessment/ Plan:  Ms. Amy Wu is a 73 y.o.  female with past medical history of hypertension, diabetes, PVD, dyslipidemia, and end-stage renal disease on dialysis.  Patient presented to the emergency department for abnormal labs and has been admitted for Severe anemia [D64.9]   [facility] /MWF/Rt thigh Permcath   End-stage renal disease on hemodialysis.  Last treatment completed on Monday.  Will schedule dialysis today, no UF due to volume loss.  Next treatment scheduled for Friday.  2. Anemia of chronic kidney disease with suspected acute blood loss.  Normocytic Lab Results  Component Value Date  HGB 6.5 (L) 03/19/2024    Hemoglobin 5.7 on admission, patient has received 1 unit blood transfusion and currently receiving additional during dialysis.  GI has been consulted and feel colonoscopy not warranted at this time.  Recommends hematology follow-up.  3. Secondary Hyperparathyroidism: with outpatient labs: None available :   Lab Results  Component Value Date   CALCIUM  8.9 03/19/2024   PHOS 4.9 (H) 03/19/2024  Currently prescribed Sensipar  and calcitriol  outpatient. Will continue to monitor bone minerals during this admission.  Calcium  and phosphorus currently acceptable.  4. Diabetes mellitus type II with chronic kidney disease/renal manifestations: noninsulin dependent. Most recent hemoglobin A1c is 6.5 on 10/01/2023.      LOS: 0 Rise Traeger 10/1/20251:31 PM

## 2024-03-20 ENCOUNTER — Telehealth (INDEPENDENT_AMBULATORY_CARE_PROVIDER_SITE_OTHER): Payer: Self-pay | Admitting: Vascular Surgery

## 2024-03-20 DIAGNOSIS — D649 Anemia, unspecified: Secondary | ICD-10-CM | POA: Diagnosis not present

## 2024-03-20 DIAGNOSIS — D513 Other dietary vitamin B12 deficiency anemia: Secondary | ICD-10-CM | POA: Diagnosis not present

## 2024-03-20 DIAGNOSIS — Z992 Dependence on renal dialysis: Secondary | ICD-10-CM | POA: Diagnosis not present

## 2024-03-20 DIAGNOSIS — N186 End stage renal disease: Secondary | ICD-10-CM | POA: Diagnosis not present

## 2024-03-20 LAB — TYPE AND SCREEN
ABO/RH(D): O POS
Antibody Screen: NEGATIVE
Unit division: 0
Unit division: 0

## 2024-03-20 LAB — CBC
HCT: 27.4 % — ABNORMAL LOW (ref 36.0–46.0)
Hemoglobin: 8.8 g/dL — ABNORMAL LOW (ref 12.0–15.0)
MCH: 26.5 pg (ref 26.0–34.0)
MCHC: 32.1 g/dL (ref 30.0–36.0)
MCV: 82.5 fL (ref 80.0–100.0)
Platelets: 128 K/uL — ABNORMAL LOW (ref 150–400)
RBC: 3.32 MIL/uL — ABNORMAL LOW (ref 3.87–5.11)
RDW: 20.5 % — ABNORMAL HIGH (ref 11.5–15.5)
WBC: 5.3 K/uL (ref 4.0–10.5)
nRBC: 1.9 % — ABNORMAL HIGH (ref 0.0–0.2)

## 2024-03-20 LAB — GLUCOSE, CAPILLARY
Glucose-Capillary: 98 mg/dL (ref 70–99)
Glucose-Capillary: 102 mg/dL — ABNORMAL HIGH (ref 70–99)

## 2024-03-20 LAB — BPAM RBC
Blood Product Expiration Date: 202510272359
Blood Product Expiration Date: 202510272359
ISSUE DATE / TIME: 202509302022
ISSUE DATE / TIME: 202510011018
Unit Type and Rh: 202510272359
Unit Type and Rh: 5100
Unit Type and Rh: 5100

## 2024-03-20 MED ORDER — VITAMIN B-12 1000 MCG PO TABS: 1000.0000 ug | ORAL_TABLET | Freq: Every day | ORAL | 0 refills | Status: AC

## 2024-03-20 NOTE — Telephone Encounter (Signed)
 So Dr Joen Cypress with [facility] left message wanting to discuss what to do about this patient. She stated that patient was discharged from the hospital today. The concern is the admitted provider wanted patient to be off Eliquis . Patient is not taking any aspirin  either. Dr Cypress wanted to know what are the risk and benefits of this patient not being on blood thinner.  Phone [phone]

## 2024-03-20 NOTE — TOC Initial Note (Signed)
 Transition of Care [facility]) - Initial/Assessment Note    Patient Details  Name: Amy Wu MRN: [id number] Date of Birth: Jul 13, 1950  [facility] CM/SW Contact:    Amy ONEIDA Haddock, RN Phone Number: 03/20/2024, 10:36 AM  Clinical Narrative:                  Patient to discharge today Patient lives at home with significant other Amy Wu, who will transport her at discharge.  Patient is a Nurse, learning disability.  Notified [facility] RN Amy Wu, she is to reach out to Amy Wu to request he bring patient by the [facility] clinic prior to her returning home        Patient Goals and CMS Choice            Expected Discharge Plan and Services         Expected Discharge Date: 03/20/24                                    Prior Living Arrangements/Services                       Activities of Daily Living   ADL Screening (condition at time of admission) Independently performs ADLs?: No Does the patient have a NEW difficulty with bathing/dressing/toileting/self-feeding that is expected to last >3 days?: No Does the patient have a NEW difficulty with getting in/out of bed, walking, or climbing stairs that is expected to last >3 days?: No Does the patient have a NEW difficulty with communication that is expected to last >3 days?: No Is the patient deaf or have difficulty hearing?: No Does the patient have difficulty seeing, even when wearing glasses/contacts?: No Does the patient have difficulty concentrating, remembering, or making decisions?: No  Permission Sought/Granted                  Emotional Assessment              Admission diagnosis:  Severe anemia [D64.9] Patient Active Problem List   Diagnosis Date Noted   Symptomatic anemia 03/19/2024   Class 2 obesity 03/19/2024   B12 deficiency anemia 03/19/2024   Severe anemia 03/18/2024   CAD  with severe RCA disease on [facility] 09/2023 03/18/2024   History of serratia bacteremia/septic shock 09/2023  03/18/2024   Atypical atrial flutter (HCC)    Serratia marcescens infection 10/09/2023   Bacteremia associated with intravascular line 10/09/2023   Typical atrial flutter (HCC) 10/06/2023   Bacteremia 10/05/2023   Elevated troponin I level 10/04/2023   Sepsis (HCC) 10/01/2023   Acute respiratory failure with hypoxia (HCC) 10/01/2023   Encephalopathy 10/01/2023   Displacement of vascular dialysis catheter 09/24/2023   Hemorrhage due to vascular prosthetic devices, implants and grafts, initial encounter 05/27/2021   ESRD on dialysis (HCC) 05/23/2021   Encounter for screening for COVID-19 03/11/2020   Restless leg syndrome, uncontrolled 06/20/2016   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/05/2014   Mild protein-calorie malnutrition 09/22/2013   Hyperlipidemia, unspecified 03/30/2011   Dependence on renal dialysis 03/30/2011   Other obesity due to excess calories 03/30/2011   Non-ST elevation (NSTEMI) myocardial infarction (HCC) 08/09/2010   Other hereditary and idiopathic neuropathies 08/09/2010   Secondary hyperparathyroidism of renal origin 04/13/2003   Hypertensive chronic kidney disease with stage 5 chronic kidney disease or end stage renal disease (HCC) 03/08/2002   Pure hypercholesterolemia 03/08/2002  Type 2 diabetes mellitus (HCC) 03/04/2002   ESRD (end stage renal disease) (HCC) 03/04/2002   PCP:  Amy Wu, Amy BRAVO, DO Pharmacy:   [facility], Amy Wu - [address] [place] [address] Phone: [phone] Fax: [phone]  [id number] [place], Amy Wu - [place] [address] AT [facility] OF [address] [place] [address] Phone: [phone] Fax: [phone]     Social Drivers of Health (SDOH) Social History: SDOH Screenings   Food Insecurity: No Food Insecurity (03/19/2024)  Housing: Low Risk  (03/19/2024)  Transportation Needs: No Transportation Needs (03/19/2024)  Utilities: Not  At Risk (03/19/2024)  Social Connections: Moderately Isolated (03/19/2024)  Tobacco Use: Low Risk  (03/18/2024)   SDOH Interventions:     Readmission Risk Interventions     No data to display

## 2024-03-20 NOTE — Progress Notes (Signed)
 [facility]  ROUNDING NOTE   Subjective:   Amy Wu is a 73 y.o. female with past medical history of hypertension, diabetes, PVD, dyslipidemia, and end-stage renal disease on dialysis.  Patient presents to the emergency department with abnormal labs and has been admitted for Severe anemia [D64.9]  Patient is known to our practice and receives outpatient dialysis treatments at [facility] on a MWF schedule, supervised by [facility] physicians.    Update: Patient sitting up in bed Alert, oriented Pleasant mood Less fatigue today Agreeable to discharge   Objective:  Vital signs in last 24 hours:  Temp:  [97.8 F (36.6 C)-98.4 F (36.9 C)] 98.1 F (36.7 C) (10/02 0723) Pulse Rate:  [51-61] 61 (10/02 0723) Resp:  [15-16] 16 (10/02 0723) BP: (95-164)/(48-69) 164/69 (10/02 0723) SpO2:  [94 %-100 %] 100 % (10/02 0418) Weight:  [87.3 kg] 87.3 kg (10/01 1333)  Weight change: -1.8 kg Filed Weights   03/19/24 0500 03/19/24 0923 03/19/24 1333  Weight: 89.1 kg 87.3 kg 87.3 kg    Intake/Output: I/O last 3 completed shifts: In: 753.7 [Blood:753.7] Out: 0    Intake/Output this shift:  Total I/O In: 240 [P.O.:240] Out: -   Physical Exam: General: NAD  Head: Normocephalic, atraumatic. Moist oral mucosal membranes  Eyes: Anicteric  Lungs:  Clear to auscultation, normal effort  Heart: Regular rate and rhythm  Abdomen:  Soft, nontender  Extremities: No peripheral edema.  Neurologic: Awake and alert  Skin: Warm,dry, no rash  Access: Right thigh PermCath    Basic Metabolic Panel: Recent Labs  Lab 03/18/24 1844 03/19/24 0945  NA 141 142  K 3.7 4.8  CL 106 103  CO2 23 24  GLUCOSE 136* 115*  BUN 39* 44*  CREATININE 4.96* 5.68*  CALCIUM  8.9 8.9  PHOS  --  4.9*    Liver Function Tests: Recent Labs  Lab 03/18/24 1844 03/19/24 0945  AST 19  --   ALT 19  --   ALKPHOS 84  --   BILITOT 0.5  --   PROT 6.1*  --   ALBUMIN 3.2* 2.9*   No results for  input(s): LIPASE, AMYLASE in the last 168 hours. No results for input(s): AMMONIA in the last 168 hours.  CBC: Recent Labs  Lab 03/18/24 1844 03/19/24 0106 03/19/24 1010 03/19/24 1438 03/20/24 0413  WBC 4.5  --   --   --  5.3  NEUTROABS 2.5  --   --   --   --   HGB 5.7* 7.2* 6.5* 9.0* 8.8*  HCT 18.1*  --   --   --  27.4*  MCV 83.8  --   --   --  82.5  PLT 127*  --   --   --  128*    Cardiac Enzymes: No results for input(s): CKTOTAL, CKMB, CKMBINDEX, TROPONINI in the last 168 hours.  BNP: Invalid input(s): POCBNP  CBG: Recent Labs  Lab 03/19/24 0851 03/19/24 1604 03/19/24 1953 03/20/24 0421 03/20/24 0721  GLUCAP 116* 153* 171* 98 102*    Microbiology: Results for orders placed or performed during the hospital encounter of 10/01/23  Resp panel by RT-PCR (RSV, Flu A&B, Covid) Anterior Nasal Swab     Status: None   Collection Time: 10/01/23  9:12 AM   Specimen: Anterior Nasal Swab  Result Value Ref Range Status   SARS Coronavirus 2 by RT PCR NEGATIVE NEGATIVE Final    Comment: (NOTE) SARS-CoV-2 target nucleic acids are NOT DETECTED.  The SARS-CoV-2 RNA is generally detectable in upper respiratory specimens during the acute phase of infection. The lowest concentration of SARS-CoV-2 viral copies this assay can detect is 138 copies/mL. A negative result does not preclude SARS-Cov-2 infection and should not be used as the sole basis for treatment or other patient management decisions. A negative result may occur with  improper specimen collection/handling, submission of specimen other than nasopharyngeal swab, presence of viral mutation(s) within the areas targeted by this assay, and inadequate number of viral copies(<138 copies/mL). A negative result must be combined with clinical observations, patient history, and epidemiological information. The expected result is Negative.  Fact Sheet for Patients:   BloggerCourse.com  Fact Sheet for Healthcare Providers:  SeriousBroker.it  This test is no t yet approved or cleared by the [place]  FDA and  has been authorized for detection and/or diagnosis of SARS-CoV-2 by FDA under an Emergency Use Authorization (EUA). This EUA will remain  in effect (meaning this test can be used) for the duration of the COVID-19 declaration under Section 564(b)(1) of the Act, 21 U.S.C.section 360bbb-3(b)(1), unless the authorization is terminated  or revoked sooner.       Influenza A by PCR NEGATIVE NEGATIVE Final   Influenza B by PCR NEGATIVE NEGATIVE Final    Comment: (NOTE) The Xpert Xpress SARS-CoV-2/FLU/RSV plus assay is intended as an aid in the diagnosis of influenza from Nasopharyngeal swab specimens and should not be used as a sole basis for treatment. Nasal washings and aspirates are unacceptable for Xpert Xpress SARS-CoV-2/FLU/RSV testing.  Fact Sheet for Patients: BloggerCourse.com  Fact Sheet for Healthcare Providers: SeriousBroker.it  This test is not yet approved or cleared by the [place]  FDA and has been authorized for detection and/or diagnosis of SARS-CoV-2 by FDA under an Emergency Use Authorization (EUA). This EUA will remain in effect (meaning this test can be used) for the duration of the COVID-19 declaration under Section 564(b)(1) of the Act, 21 U.S.C. section 360bbb-3(b)(1), unless the authorization is terminated or revoked.     Resp Syncytial Virus by PCR NEGATIVE NEGATIVE Final    Comment: (NOTE) Fact Sheet for Patients: BloggerCourse.com  Fact Sheet for Healthcare Providers: SeriousBroker.it  This test is not yet approved or cleared by the [place]  FDA and has been authorized for detection and/or diagnosis of SARS-CoV-2 by FDA under an Emergency Use  Authorization (EUA). This EUA will remain in effect (meaning this test can be used) for the duration of the COVID-19 declaration under Section 564(b)(1) of the Act, 21 U.S.C. section 360bbb-3(b)(1), unless the authorization is terminated or revoked.  Performed at [facility], [address] [place], [place] [address]   Blood Culture (routine x 2)     Status: Abnormal   Collection Time: 10/01/23  9:12 AM   Specimen: BLOOD  Result Value Ref Range Status   Specimen Description   Final    BLOOD BLOOD LEFT FOREARM Performed at [facility], [address], [place], [place] [address]    Special Requests   Final    BOTTLES DRAWN AEROBIC AND ANAEROBIC Blood Culture results may not be optimal due to an inadequate volume of blood received in culture bottles Performed at [facility], [address], [place], [place] [address]    Culture  Setup Time   Final    GRAM NEGATIVE RODS IN BOTH AEROBIC AND ANAEROBIC BOTTLES CRITICAL VALUE NOTED.  VALUE IS CONSISTENT WITH PREVIOUSLY REPORTED AND CALLED VALUE. Performed at [facility],  [address] [place], [place] [address]    Culture (A)  Final    SERRATIA MARCESCENS SUSCEPTIBILITIES PERFORMED ON PREVIOUS CULTURE WITHIN THE LAST 5 DAYS. Performed at [facility], [address], [place], [place] [address]    Report Status 10/04/2023 FINAL  Final  Respiratory (~20 pathogens) panel by PCR     Status: None   Collection Time: 10/01/23  9:12 AM   Specimen: Nasopharyngeal Swab; Respiratory  Result Value Ref Range Status   Adenovirus NOT DETECTED NOT DETECTED Final   Coronavirus 229E NOT DETECTED NOT DETECTED Final    Comment: (NOTE) The Coronavirus on the Respiratory Panel, DOES NOT test for the novel  Coronavirus (2019 nCoV)    Coronavirus HKU1 NOT DETECTED NOT DETECTED Final   Coronavirus NL63 NOT DETECTED NOT DETECTED Final   Coronavirus OC43 NOT DETECTED NOT DETECTED Final   Metapneumovirus  NOT DETECTED NOT DETECTED Final   Rhinovirus / Enterovirus NOT DETECTED NOT DETECTED Final   Influenza A NOT DETECTED NOT DETECTED Final   Influenza B NOT DETECTED NOT DETECTED Final   Parainfluenza Virus 1 NOT DETECTED NOT DETECTED Final   Parainfluenza Virus 2 NOT DETECTED NOT DETECTED Final   Parainfluenza Virus 3 NOT DETECTED NOT DETECTED Final   Parainfluenza Virus 4 NOT DETECTED NOT DETECTED Final   Respiratory Syncytial Virus NOT DETECTED NOT DETECTED Final   Bordetella pertussis NOT DETECTED NOT DETECTED Final   Bordetella Parapertussis NOT DETECTED NOT DETECTED Final   Chlamydophila pneumoniae NOT DETECTED NOT DETECTED Final   Mycoplasma pneumoniae NOT DETECTED NOT DETECTED Final    Comment: Performed at [facility], [address] [place], [place] [address]  Blood Culture (routine x 2)     Status: Abnormal   Collection Time: 10/01/23  9:33 AM   Specimen: BLOOD  Result Value Ref Range Status   Specimen Description   Final    BLOOD RIGHT WRIST Performed at [facility], [address], [place], [place] [address]    Special Requests   Final    BOTTLES DRAWN AEROBIC AND ANAEROBIC Blood Culture adequate volume Performed at [facility], [address], [place], [place] [address]    Culture  Setup Time   Final    GRAM NEGATIVE RODS IN BOTH AEROBIC AND ANAEROBIC BOTTLES CRITICAL RESULT CALLED TO, READ BACK BY AND VERIFIED WITH:  NATHAN BELUE AT 0457 10/02/23 JG Performed at [facility], [address] [place], [place] [address]    Culture SERRATIA MARCESCENS (A)  Final   Report Status 10/04/2023 FINAL  Final   Organism ID, Bacteria SERRATIA MARCESCENS  Final      Susceptibility   Serratia marcescens - MIC*    CEFEPIME  <=0.12 SENSITIVE Sensitive     CEFTAZIDIME <=1 SENSITIVE Sensitive     CEFTRIAXONE  <=0.25 SENSITIVE Sensitive     CIPROFLOXACIN  <=0.25 SENSITIVE Sensitive     GENTAMICIN <=1 SENSITIVE Sensitive     TRIMETH /SULFA  <=20  SENSITIVE Sensitive     * SERRATIA MARCESCENS  Blood Culture ID Panel (Reflexed)     Status: Abnormal   Collection Time: 10/01/23  9:33 AM  Result Value Ref Range Status   Enterococcus faecalis NOT DETECTED NOT DETECTED Final   Enterococcus Faecium NOT DETECTED NOT DETECTED Final   Listeria monocytogenes NOT DETECTED NOT DETECTED Final   Staphylococcus species NOT DETECTED NOT DETECTED Final   Staphylococcus aureus (BCID) NOT DETECTED NOT DETECTED Final   Staphylococcus epidermidis NOT DETECTED  NOT DETECTED Final   Staphylococcus lugdunensis NOT DETECTED NOT DETECTED Final   Streptococcus species NOT DETECTED NOT DETECTED Final   Streptococcus agalactiae NOT DETECTED NOT DETECTED Final   Streptococcus pneumoniae NOT DETECTED NOT DETECTED Final   Streptococcus pyogenes NOT DETECTED NOT DETECTED Final   A.calcoaceticus-baumannii NOT DETECTED NOT DETECTED Final   Bacteroides fragilis NOT DETECTED NOT DETECTED Final   Enterobacterales DETECTED (A) NOT DETECTED Final    Comment: Enterobacterales represent a large order of gram negative bacteria, not a single organism. CRITICAL RESULT CALLED TO, READ BACK BY AND VERIFIED WITH:  NATHAN BELUE AT 0457 10/02/23 JG    Enterobacter cloacae complex NOT DETECTED NOT DETECTED Final   Escherichia coli NOT DETECTED NOT DETECTED Final   Klebsiella aerogenes NOT DETECTED NOT DETECTED Final   Klebsiella oxytoca NOT DETECTED NOT DETECTED Final   Klebsiella pneumoniae NOT DETECTED NOT DETECTED Final   Proteus species NOT DETECTED NOT DETECTED Final   Salmonella species NOT DETECTED NOT DETECTED Final   Serratia marcescens DETECTED (A) NOT DETECTED Final    Comment: CRITICAL RESULT CALLED TO, READ BACK BY AND VERIFIED WITH:  NATHAN BELUE AT 0457 10/02/23 JG    Haemophilus influenzae NOT DETECTED NOT DETECTED Final   Neisseria meningitidis NOT DETECTED NOT DETECTED Final   Pseudomonas aeruginosa NOT DETECTED NOT DETECTED Final   Stenotrophomonas  maltophilia NOT DETECTED NOT DETECTED Final   Candida albicans NOT DETECTED NOT DETECTED Final   Candida auris NOT DETECTED NOT DETECTED Final   Candida glabrata NOT DETECTED NOT DETECTED Final   Candida krusei NOT DETECTED NOT DETECTED Final   Candida parapsilosis NOT DETECTED NOT DETECTED Final   Candida tropicalis NOT DETECTED NOT DETECTED Final   Cryptococcus neoformans/gattii NOT DETECTED NOT DETECTED Final   CTX-M ESBL NOT DETECTED NOT DETECTED Final   Carbapenem resistance IMP NOT DETECTED NOT DETECTED Final   Carbapenem resistance KPC NOT DETECTED NOT DETECTED Final   Carbapenem resistance NDM NOT DETECTED NOT DETECTED Final   Carbapenem resist OXA 48 LIKE NOT DETECTED NOT DETECTED Final   Carbapenem resistance VIM NOT DETECTED NOT DETECTED Final    Comment: Performed at [facility], [address] [place], [place] [address]  MRSA Next Gen by PCR, Nasal     Status: None   Collection Time: 10/02/23  5:26 PM   Specimen: Nasal Mucosa; Nasal Swab  Result Value Ref Range Status   MRSA by PCR Next Gen NOT DETECTED NOT DETECTED Final    Comment: (NOTE) The GeneXpert MRSA Assay (FDA approved for NASAL specimens only), is one component of a comprehensive MRSA colonization surveillance program. It is not intended to diagnose MRSA infection nor to guide or monitor treatment for MRSA infections. Test performance is not FDA approved in patients less than 56 years old. Performed at [facility], [address], [place], [place] [address]   Cath Tip Culture     Status: Abnormal   Collection Time: 10/02/23  9:28 PM   Specimen: Catheter Tip; Other  Result Value Ref Range Status   Specimen Description   Final    CATH TIP Performed at [facility], [address] [place], [place] [address]    Special Requests   Final    NONE Performed at [facility], [address] [place], [place] [address]    Culture >=100,000 COLONIES/mL SERRATIA  MARCESCENS (A)  Final   Report Status 10/08/2023 FINAL  Final   Organism ID, Bacteria SERRATIA MARCESCENS (A)  Final      Susceptibility   Serratia marcescens - MIC*    CEFEPIME  <=0.12 SENSITIVE Sensitive     CEFTAZIDIME <=1 SENSITIVE Sensitive     CEFTRIAXONE  <=0.25 SENSITIVE Sensitive     CIPROFLOXACIN  <=0.25 SENSITIVE Sensitive     GENTAMICIN <=1 SENSITIVE Sensitive     TRIMETH /SULFA  <=20 SENSITIVE Sensitive     * >=100,000 COLONIES/mL SERRATIA MARCESCENS  Culture, blood (Routine X 2) w Reflex to ID Panel     Status: None   Collection Time: 10/03/23  1:00 PM   Specimen: BLOOD  Result Value Ref Range Status   Specimen Description BLOOD BLOOD LEFT WRIST  Final   Special Requests   Final    BOTTLES DRAWN AEROBIC ONLY Blood Culture adequate volume   Culture   Final    NO GROWTH 5 DAYS Performed at [facility], [address], [place], [place] [address]    Report Status 10/08/2023 FINAL  Final  Culture, blood (Routine X 2) w Reflex to ID Panel     Status: None   Collection Time: 10/03/23  1:04 PM   Specimen: BLOOD  Result Value Ref Range Status   Specimen Description BLOOD BLOOD LEFT FOREARM  Final   Special Requests   Final    BOTTLES DRAWN AEROBIC ONLY Blood Culture adequate volume   Culture   Final    NO GROWTH 5 DAYS Performed at [facility], [address] [place], [place] [address]    Report Status 10/08/2023 FINAL  Final    Coagulation Studies: No results for input(s): LABPROT, INR in the last 72 hours.  Urinalysis: No results for input(s): COLORURINE, LABSPEC, PHURINE, GLUCOSEU, HGBUR, BILIRUBINUR, KETONESUR, PROTEINUR, UROBILINOGEN, NITRITE, LEUKOCYTESUR in the last 72 hours.  Invalid input(s): APPERANCEUR    Imaging: No results found.   Medications:    sodium chloride      sodium chloride       sodium chloride    Intravenous Once   amiodarone   200 mg Oral Daily   atorvastatin   40 mg Oral Daily    calcitRIOL   0.25 mcg Oral Q M,W,F-HD   Chlorhexidine  Gluconate Cloth  6 each Topical Daily   cyanocobalamin  1,000 mcg Intramuscular Daily   insulin  aspart  0-6 Units Subcutaneous Q4H   senna-docusate  2 tablet Oral QHS   acetaminophen  **OR** acetaminophen , gabapentin , guaiFENesin , HYDROcodone -acetaminophen , HYDROmorphone  (DILAUDID ) injection, ondansetron  **OR** ondansetron  (ZOFRAN ) IV, tiZANidine   Assessment/ Plan:  Amy Wu is a 73 y.o.  female with past medical history of hypertension, diabetes, PVD, dyslipidemia, and end-stage renal disease on dialysis.  Patient presented to the emergency department for abnormal labs and has been admitted for Severe anemia [D64.9]   [facility]/MWF/Rt thigh Permcath   End-stage renal disease on hemodialysis.  Received dialysis yesterday, no UF.  Next treatment scheduled for Friday.  2. Anemia of chronic kidney disease with suspected acute blood loss.  Normocytic Lab Results  Component Value Date   HGB 8.8 (L) 03/20/2024    Hemoglobin 5.7 on admission. GI consulted and feel colonoscopy not warranted at this time.  Recommends hematology follow-up. Patient received 2 units of blood during admission..   3. Secondary Hyperparathyroidism: with outpatient labs: None available :  Lab Results  Component Value Date   CALCIUM  8.9 03/19/2024   PHOS 4.9 (H) 03/19/2024  Currently prescribed Sensipar  and calcitriol  outpatient.  Calcium  and phosphorus currently acceptable.  4. Diabetes mellitus type II with chronic kidney disease/renal manifestations: noninsulin dependent. Most  recent hemoglobin A1c is 6.5 on 10/01/2023.    Well controlled   LOS: 1 Davon Folta 10/2/20251:22 PM

## 2024-03-20 NOTE — Discharge Summary (Signed)
 Physician Discharge Summary   Patient: Amy Wu MRN: [id number] DOB: 1951/05/30  Admit date:     03/18/2024  Discharge date: 03/20/24  Discharge Physician: Murvin Mana   PCP: Fleeta Pedro, Jill E, DO   Recommendations at discharge:   Follow-up with PCP in 1 week.  Discharge Diagnoses: Principal Problem:   Severe anemia Active Problems:   CAD  with severe RCA disease on [facility] 09/2023   ESRD on dialysis [facility])   Atypical atrial flutter (HCC)   Type 2 diabetes mellitus (HCC)   History of serratia bacteremia/septic shock 09/2023   Symptomatic anemia   Class 2 obesity   B12 deficiency anemia  Resolved Problems:   * No resolved hospital problems. [facility] Course: Amy Wu is a 73 y.o. female with medical history significant for ESRD on HD  (MWF schedule), CAD (severe RCA disease on LHC 09/2023) DM,, PVD, hospitalized April 2025 for fluid overload (4/12 to 10/12/2023) complicated by septic shock related to Serratia bacteremia from HD catheter, developing new onset atrial flutter now on apixaban  and amiodarone , being admitted with severe anemia, hemoglobin at 5.5 at dialysis today.  Patient was seen by GI, received 2 units of PRBC.  Patient does not have any bleeding. Patient also given B12 shot for B12 deficiency.  Hemoglobin today 8.8.  Patient is medically stable for discharge.  Assessment and Plan:  Severe anemia B12 deficient anemia Anemia of end-stage renal disease. Hemoglobin 5.7 down from 8.7 a few months prior Patient has received 2 units of PRBC, hemoglobin has increased to 8.8. Patient also has B12 level of less than 150, started injection of B12, she has received  2 doses.  Will continue with oral B12 supplement.   CAD  with severe RCA disease on [facility] 09/2023 Patient has no symptoms of recurrence.  Continue home medicines.   ESRD on dialysis [facility]) Nephrology consult for continuation of dialysis   Atypical atrial flutter (HCC) Continue amiodarone   Holding apixaban   due to severe anemia of uncertain etiology   Type 2 diabetes mellitus (HCC) Sliding scale insulin  coverage   History of serratia bacteremia/septic shock 09/2023 No acute issues suspected       Consultants: Nephrology Procedures performed: HD  Disposition: Home Diet recommendation:  Discharge Diet Orders (From admission, onward)     Start     Ordered   03/20/24 0000  Diet renal with fluid restriction        03/20/24 1003           Renal diet DISCHARGE MEDICATION: Allergies as of 03/20/2024       Reactions   Cefepime  Other (See Comments)   Encephalopathy   Ancef  [cefazolin ] Palpitations   Contrast Media [iodinated Contrast Media] Palpitations        Medication List     STOP taking these medications    Lokelma 10 g Pack packet Generic drug: sodium zirconium cyclosilicate       TAKE these medications    acetaminophen  500 MG tablet Commonly known as: TYLENOL  Take 1,000 mg by mouth every 8 (eight) hours as needed for moderate pain.   amiodarone  200 MG tablet Commonly known as: PACERONE  Take 1 tablet (200 mg total) by mouth daily.   apixaban  5 MG Tabs tablet Commonly known as: ELIQUIS  Take 1 tablet (5 mg total) by mouth 2 (two) times daily.   atorvastatin  40 MG tablet Commonly known as: LIPITOR Take 1 tablet (40 mg total) by mouth daily.   calcitRIOL  0.25 MCG  capsule Commonly known as: ROCALTROL  Take 0.25 mcg by mouth daily. Take 3 capsules by mouth three times week at dialysis   cinacalcet  30 MG tablet Commonly known as: SENSIPAR  Take 30 mg by mouth daily with supper.   Comfort EZ Pen Needles 31G X 6 MM Misc Generic drug: Insulin  Pen Needle Inject into the skin.   cyanocobalamin 1000 MCG tablet Commonly known as: VITAMIN B12 Take 1 tablet (1,000 mcg total) by mouth daily.   ferrous sulfate  325 (65 FE) MG tablet Take 325 mg by mouth every other day.   fluticasone  50 MCG/ACT nasal spray Commonly known as: FLONASE  Place 1 spray into both  nostrils daily.   gabapentin  100 MG capsule Commonly known as: NEURONTIN  Take 100 mg by mouth 3 (three) times daily as needed. M/W/F after dialysis PRN for leg/thigh cramps   glucose 4 GM chewable tablet Chew 1 tablet by mouth as needed for low blood sugar. If blood sugar less than 70. Chew 3 tablets and recheck blood sugar in 15 minutes   glucose blood test strip 1 each by Other route as needed for other. Use as instructed   Januvia 25 MG tablet Generic drug: sitaGLIPtin Take 25 mg by mouth daily.   lanthanum  1000 MG chewable tablet Commonly known as: FOSRENOL  Chew 1,000 mg by mouth 2 (two) times daily with a meal.   loratadine  10 MG tablet Commonly known as: CLARITIN  Take 10 mg by mouth daily as needed for allergies.   PreserVision AREDS 2 Caps Take 1 capsule by mouth 2 (two) times daily.   senna-docusate 8.6-50 MG tablet Commonly known as: Senokot-S Take 1-2 tablets by mouth at bedtime as needed for mild constipation or moderate constipation. 1-2 tablets at bedtime if needed   Stool Softener 100 MG capsule Generic drug: docusate sodium  Take 100 mg by mouth 2 (two) times daily.   tiZANidine  4 MG tablet Commonly known as: ZANAFLEX  Take 4 mg by mouth 2 (two) times daily as needed for muscle spasms.   TRUEplus Lancets 28G Misc Apply topically.   UltiCare Insulin  Syringe 31G X 5/16 0.3 ML Misc Generic drug: Insulin  Syringe-Needle U-100 SMARTSIG:Injection Daily   Veltassa 8.4 g packet Generic drug: patiromer Take 8.4 g by mouth daily.        Follow-up Information     Fleeta Pedro, Jill E, DO Follow up in 1 week(s).   Specialty: Osteopathic Medicine Contact information: [address] [place] [address] [phone]                Discharge Exam: Fredricka Weights   03/19/24 0500 03/19/24 0923 03/19/24 1333  Weight: 89.1 kg 87.3 kg 87.3 kg   General exam: Appears calm and comfortable  Respiratory system: Clear to auscultation. Respiratory effort  normal. Cardiovascular system: S1 & S2 heard, RRR. No JVD, murmurs, rubs, gallops or clicks. No pedal edema. Gastrointestinal system: Abdomen is nondistended, soft and nontender. No organomegaly or masses felt. Normal bowel sounds heard. Central nervous system: Alert and oriented. No focal neurological deficits. Extremities: Symmetric 5 x 5 power. Skin: No rashes, lesions or ulcers Psychiatry: Judgement and insight appear normal. Mood & affect appropriate.    Condition at discharge: good  The results of significant diagnostics from this hospitalization (including imaging, microbiology, ancillary and laboratory) are listed below for reference.   Imaging Studies: No results found.  Microbiology: Results for orders placed or performed during the hospital encounter of 10/01/23  Resp panel by RT-PCR (RSV, Flu A&B, Covid) Anterior Nasal Swab  Status: None   Collection Time: 10/01/23  9:12 AM   Specimen: Anterior Nasal Swab  Result Value Ref Range Status   SARS Coronavirus 2 by RT PCR NEGATIVE NEGATIVE Final    Comment: (NOTE) SARS-CoV-2 target nucleic acids are NOT DETECTED.  The SARS-CoV-2 RNA is generally detectable in upper respiratory specimens during the acute phase of infection. The lowest concentration of SARS-CoV-2 viral copies this assay can detect is 138 copies/mL. A negative result does not preclude SARS-Cov-2 infection and should not be used as the sole basis for treatment or other patient management decisions. A negative result may occur with  improper specimen collection/handling, submission of specimen other than nasopharyngeal swab, presence of viral mutation(s) within the areas targeted by this assay, and inadequate number of viral copies(<138 copies/mL). A negative result must be combined with clinical observations, patient history, and epidemiological information. The expected result is Negative.  Fact Sheet for Patients:   BloggerCourse.com  Fact Sheet for Healthcare Providers:  SeriousBroker.it  This test is no t yet approved or cleared by the [place]  FDA and  has been authorized for detection and/or diagnosis of SARS-CoV-2 by FDA under an Emergency Use Authorization (EUA). This EUA will remain  in effect (meaning this test can be used) for the duration of the COVID-19 declaration under Section 564(b)(1) of the Act, 21 U.S.C.section 360bbb-3(b)(1), unless the authorization is terminated  or revoked sooner.       Influenza A by PCR NEGATIVE NEGATIVE Final   Influenza B by PCR NEGATIVE NEGATIVE Final    Comment: (NOTE) The Xpert Xpress SARS-CoV-2/FLU/RSV plus assay is intended as an aid in the diagnosis of influenza from Nasopharyngeal swab specimens and should not be used as a sole basis for treatment. Nasal washings and aspirates are unacceptable for Xpert Xpress SARS-CoV-2/FLU/RSV testing.  Fact Sheet for Patients: BloggerCourse.com  Fact Sheet for Healthcare Providers: SeriousBroker.it  This test is not yet approved or cleared by the [place]  FDA and has been authorized for detection and/or diagnosis of SARS-CoV-2 by FDA under an Emergency Use Authorization (EUA). This EUA will remain in effect (meaning this test can be used) for the duration of the COVID-19 declaration under Section 564(b)(1) of the Act, 21 U.S.C. section 360bbb-3(b)(1), unless the authorization is terminated or revoked.     Resp Syncytial Virus by PCR NEGATIVE NEGATIVE Final    Comment: (NOTE) Fact Sheet for Patients: BloggerCourse.com  Fact Sheet for Healthcare Providers: SeriousBroker.it  This test is not yet approved or cleared by the [place]  FDA and has been authorized for detection and/or diagnosis of SARS-CoV-2 by FDA under an Emergency Use  Authorization (EUA). This EUA will remain in effect (meaning this test can be used) for the duration of the COVID-19 declaration under Section 564(b)(1) of the Act, 21 U.S.C. section 360bbb-3(b)(1), unless the authorization is terminated or revoked.  Performed at [facility], [address] [place], [place] [address]   Blood Culture (routine x 2)     Status: Abnormal   Collection Time: 10/01/23  9:12 AM   Specimen: BLOOD  Result Value Ref Range Status   Specimen Description   Final    BLOOD BLOOD LEFT FOREARM Performed at [facility], [address], [place], [place] [address]    Special Requests   Final    BOTTLES DRAWN AEROBIC AND ANAEROBIC Blood Culture results may not be optimal due to an inadequate volume of blood received in culture bottles Performed at [facility], [address] [place], [place] [address]    Culture  Setup Time   Final    GRAM NEGATIVE RODS IN BOTH AEROBIC AND ANAEROBIC BOTTLES CRITICAL VALUE NOTED.  VALUE IS CONSISTENT WITH PREVIOUSLY REPORTED AND CALLED VALUE. Performed at [facility] [address] [place], [place] [address]    Culture (A)  Final    SERRATIA MARCESCENS SUSCEPTIBILITIES PERFORMED ON PREVIOUS CULTURE WITHIN THE LAST 5 DAYS. Performed at [facility], [address], [place], [place] [address]    Report Status 10/04/2023 FINAL  Final  Respiratory (~20 pathogens) panel by PCR     Status: None   Collection Time: 10/01/23  9:12 AM   Specimen: Nasopharyngeal Swab; Respiratory  Result Value Ref Range Status   Adenovirus NOT DETECTED NOT DETECTED Final   Coronavirus 229E NOT DETECTED NOT DETECTED Final    Comment: (NOTE) The Coronavirus on the Respiratory Panel, DOES NOT test for the novel  Coronavirus (2019 nCoV)    Coronavirus HKU1 NOT DETECTED NOT DETECTED Final   Coronavirus NL63 NOT DETECTED NOT DETECTED Final   Coronavirus OC43 NOT DETECTED NOT DETECTED Final   Metapneumovirus  NOT DETECTED NOT DETECTED Final   Rhinovirus / Enterovirus NOT DETECTED NOT DETECTED Final   Influenza A NOT DETECTED NOT DETECTED Final   Influenza B NOT DETECTED NOT DETECTED Final   Parainfluenza Virus 1 NOT DETECTED NOT DETECTED Final   Parainfluenza Virus 2 NOT DETECTED NOT DETECTED Final   Parainfluenza Virus 3 NOT DETECTED NOT DETECTED Final   Parainfluenza Virus 4 NOT DETECTED NOT DETECTED Final   Respiratory Syncytial Virus NOT DETECTED NOT DETECTED Final   Bordetella pertussis NOT DETECTED NOT DETECTED Final   Bordetella Parapertussis NOT DETECTED NOT DETECTED Final   Chlamydophila pneumoniae NOT DETECTED NOT DETECTED Final   Mycoplasma pneumoniae NOT DETECTED NOT DETECTED Final    Comment: Performed at [facility], [address], [place], [place] [address]  Blood Culture (routine x 2)     Status: Abnormal   Collection Time: 10/01/23  9:33 AM   Specimen: BLOOD  Result Value Ref Range Status   Specimen Description   Final    BLOOD RIGHT WRIST Performed at [facility], [address], [place], [place] [address]    Special Requests   Final    BOTTLES DRAWN AEROBIC AND ANAEROBIC Blood Culture adequate volume Performed at [facility], [address] [place], [place] [address]    Culture  Setup Time   Final    GRAM NEGATIVE RODS IN BOTH AEROBIC AND ANAEROBIC BOTTLES CRITICAL RESULT CALLED TO, READ BACK BY AND VERIFIED WITH:  NATHAN BELUE AT 0457 10/02/23 JG Performed at [facility], [address] [place], [place] [address]    Culture SERRATIA MARCESCENS (A)  Final   Report Status 10/04/2023 FINAL  Final   Organism ID, Bacteria SERRATIA MARCESCENS  Final      Susceptibility   Serratia marcescens - MIC*    CEFEPIME  <=0.12 SENSITIVE Sensitive     CEFTAZIDIME <=1 SENSITIVE Sensitive     CEFTRIAXONE  <=0.25 SENSITIVE Sensitive     CIPROFLOXACIN  <=0.25 SENSITIVE Sensitive     GENTAMICIN <=1 SENSITIVE Sensitive     TRIMETH /SULFA  <=20  SENSITIVE Sensitive     * SERRATIA MARCESCENS  Blood Culture ID Panel (Reflexed)     Status: Abnormal   Collection Time: 10/01/23  9:33 AM  Result Value Ref Range Status   Enterococcus faecalis NOT DETECTED  NOT DETECTED Final   Enterococcus Faecium NOT DETECTED NOT DETECTED Final   Listeria monocytogenes NOT DETECTED NOT DETECTED Final   Staphylococcus species NOT DETECTED NOT DETECTED Final   Staphylococcus aureus (BCID) NOT DETECTED NOT DETECTED Final   Staphylococcus epidermidis NOT DETECTED NOT DETECTED Final   Staphylococcus lugdunensis NOT DETECTED NOT DETECTED Final   Streptococcus species NOT DETECTED NOT DETECTED Final   Streptococcus agalactiae NOT DETECTED NOT DETECTED Final   Streptococcus pneumoniae NOT DETECTED NOT DETECTED Final   Streptococcus pyogenes NOT DETECTED NOT DETECTED Final   A.calcoaceticus-baumannii NOT DETECTED NOT DETECTED Final   Bacteroides fragilis NOT DETECTED NOT DETECTED Final   Enterobacterales DETECTED (A) NOT DETECTED Final    Comment: Enterobacterales represent a large order of gram negative bacteria, not a single organism. CRITICAL RESULT CALLED TO, READ BACK BY AND VERIFIED WITH:  NATHAN BELUE AT 0457 10/02/23 JG    Enterobacter cloacae complex NOT DETECTED NOT DETECTED Final   Escherichia coli NOT DETECTED NOT DETECTED Final   Klebsiella aerogenes NOT DETECTED NOT DETECTED Final   Klebsiella oxytoca NOT DETECTED NOT DETECTED Final   Klebsiella pneumoniae NOT DETECTED NOT DETECTED Final   Proteus species NOT DETECTED NOT DETECTED Final   Salmonella species NOT DETECTED NOT DETECTED Final   Serratia marcescens DETECTED (A) NOT DETECTED Final    Comment: CRITICAL RESULT CALLED TO, READ BACK BY AND VERIFIED WITH:  NATHAN BELUE AT 0457 10/02/23 JG    Haemophilus influenzae NOT DETECTED NOT DETECTED Final   Neisseria meningitidis NOT DETECTED NOT DETECTED Final   Pseudomonas aeruginosa NOT DETECTED NOT DETECTED Final   Stenotrophomonas  maltophilia NOT DETECTED NOT DETECTED Final   Candida albicans NOT DETECTED NOT DETECTED Final   Candida auris NOT DETECTED NOT DETECTED Final   Candida glabrata NOT DETECTED NOT DETECTED Final   Candida krusei NOT DETECTED NOT DETECTED Final   Candida parapsilosis NOT DETECTED NOT DETECTED Final   Candida tropicalis NOT DETECTED NOT DETECTED Final   Cryptococcus neoformans/gattii NOT DETECTED NOT DETECTED Final   CTX-M ESBL NOT DETECTED NOT DETECTED Final   Carbapenem resistance IMP NOT DETECTED NOT DETECTED Final   Carbapenem resistance KPC NOT DETECTED NOT DETECTED Final   Carbapenem resistance NDM NOT DETECTED NOT DETECTED Final   Carbapenem resist OXA 48 LIKE NOT DETECTED NOT DETECTED Final   Carbapenem resistance VIM NOT DETECTED NOT DETECTED Final    Comment: Performed at [facility], [address] [place], [place] [address]  MRSA Next Gen by PCR, Nasal     Status: None   Collection Time: 10/02/23  5:26 PM   Specimen: Nasal Mucosa; Nasal Swab  Result Value Ref Range Status   MRSA by PCR Next Gen NOT DETECTED NOT DETECTED Final    Comment: (NOTE) The GeneXpert MRSA Assay (FDA approved for NASAL specimens only), is one component of a comprehensive MRSA colonization surveillance program. It is not intended to diagnose MRSA infection nor to guide or monitor treatment for MRSA infections. Test performance is not FDA approved in patients less than 20 years old. Performed at [facility], [address], [place], [place] [address]   Cath Tip Culture     Status: Abnormal   Collection Time: 10/02/23  9:28 PM   Specimen: Catheter Tip; Other  Result Value Ref Range Status   Specimen Description   Final    CATH TIP Performed at [facility], [address], [place], [place] [address]    Special Requests  Final    NONE Performed at [facility], [address] [place], [place] [address]    Culture >=100,000 COLONIES/mL SERRATIA  MARCESCENS (A)  Final   Report Status 10/08/2023 FINAL  Final   Organism ID, Bacteria SERRATIA MARCESCENS (A)  Final      Susceptibility   Serratia marcescens - MIC*    CEFEPIME  <=0.12 SENSITIVE Sensitive     CEFTAZIDIME <=1 SENSITIVE Sensitive     CEFTRIAXONE  <=0.25 SENSITIVE Sensitive     CIPROFLOXACIN  <=0.25 SENSITIVE Sensitive     GENTAMICIN <=1 SENSITIVE Sensitive     TRIMETH /SULFA  <=20 SENSITIVE Sensitive     * >=100,000 COLONIES/mL SERRATIA MARCESCENS  Culture, blood (Routine X 2) w Reflex to ID Panel     Status: None   Collection Time: 10/03/23  1:00 PM   Specimen: BLOOD  Result Value Ref Range Status   Specimen Description BLOOD BLOOD LEFT WRIST  Final   Special Requests   Final    BOTTLES DRAWN AEROBIC ONLY Blood Culture adequate volume   Culture   Final    NO GROWTH 5 DAYS Performed at [facility], [address] [place], [place] [address]    Report Status 10/08/2023 FINAL  Final  Culture, blood (Routine X 2) w Reflex to ID Panel     Status: None   Collection Time: 10/03/23  1:04 PM   Specimen: BLOOD  Result Value Ref Range Status   Specimen Description BLOOD BLOOD LEFT FOREARM  Final   Special Requests   Final    BOTTLES DRAWN AEROBIC ONLY Blood Culture adequate volume   Culture   Final    NO GROWTH 5 DAYS Performed at [facility], [address] [place], [place] [address]    Report Status 10/08/2023 FINAL  Final    Labs: CBC: Recent Labs  Lab 03/18/24 1844 03/19/24 0106 03/19/24 1010 03/19/24 1438 03/20/24 0413  WBC 4.5  --   --   --  5.3  NEUTROABS 2.5  --   --   --   --   HGB 5.7* 7.2* 6.5* 9.0* 8.8*  HCT 18.1*  --   --   --  27.4*  MCV 83.8  --   --   --  82.5  PLT 127*  --   --   --  128*   Basic Metabolic Panel: Recent Labs  Lab 03/18/24 1844 03/19/24 0945  NA 141 142  K 3.7 4.8  CL 106 103  CO2 23 24  GLUCOSE 136* 115*  BUN 39* 44*  CREATININE 4.96* 5.68*  CALCIUM  8.9 8.9  PHOS  --  4.9*   Liver Function  Tests: Recent Labs  Lab 03/18/24 1844 03/19/24 0945  AST 19  --   ALT 19  --   ALKPHOS 84  --   BILITOT 0.5  --   PROT 6.1*  --   ALBUMIN 3.2* 2.9*   CBG: Recent Labs  Lab 03/19/24 0851 03/19/24 1604 03/19/24 1953 03/20/24 0421 03/20/24 0721  GLUCAP 116* 153* 171* 98 102*    Discharge time spent: 35 minutes.  Signed: Murvin Mana, MD [facility] 03/20/2024

## 2024-03-20 NOTE — Progress Notes (Signed)
 D/C order noted. Contacted [facility]  MWF to be advised of pt and d/c today. Requested documents faxed to clinic for continuation of care.    Suzen Satchel  Dialysis Navigator  [phone]

## 2024-03-21 NOTE — Telephone Encounter (Signed)
 I called and discussed with Dr. Eleanore

## 2024-03-26 ENCOUNTER — Telehealth: Payer: Self-pay | Admitting: Cardiovascular Disease

## 2024-03-26 ENCOUNTER — Telehealth (INDEPENDENT_AMBULATORY_CARE_PROVIDER_SITE_OTHER): Payer: Self-pay | Admitting: Vascular Surgery

## 2024-03-26 NOTE — Telephone Encounter (Signed)
  Pt c/o medication issue:  1. Name of Medication:   apixaban  (ELIQUIS ) 5 MG TABS tablet    2. How are you currently taking this medication (dosage and times per day)?   Take 1 tablet (5 mg total) by mouth 2 (two) times daily.    3. Are you having a reaction (difficulty breathing--STAT)? No   4. What is your medication issue? Sherri Mou from [facility] called and said the patient is currently undergoing dialysis and has anemia. The patient's hemoglobin dropped rapidly to 5.7, and she was admitted on 09/30, during which she received a transfusion. She was evaluated by GI, and no bleeding complications were found. During her hospital stay, Eliquis  was discontinued. Sherri, who is the patient's PCP, stated that she has spoken with the dialysis team and the patient's other doctors, and the patient is maintaining her hemoglobin. She would like to ask Dr. Gollan when the patient can resume taking Eliquis .

## 2024-03-26 NOTE — Telephone Encounter (Signed)
 Pt called stating that her alarms on her compression machine keeps going off. And left leg keeps bothering the machine telling her something is wrong with her leg. Would like to see GS. Just have surgery in June. Please advise what patient should do and if she should come in with US . Dialysis is on Mon, Wed, Friday.

## 2024-03-27 ENCOUNTER — Telehealth (INDEPENDENT_AMBULATORY_CARE_PROVIDER_SITE_OTHER): Payer: Self-pay

## 2024-03-27 NOTE — Telephone Encounter (Addendum)
 Spoke with the patient and she is scheduled with Dr. Jama for a permcath exchange at the [facility] with a 12:00 pm arrival time to the [facility]. Patient does not have a Mychart. Pre-procedure instructions were faxed to Kya at [facility].

## 2024-03-27 NOTE — Telephone Encounter (Signed)
 Spoke with the patient and advised that she have Mark at [facility] over a order for a procedure/referral depending upon which is needed. Patient stated she would do so. Patient stated Oneil said he could not speak with anyone in our office, I advised that I had spoken with Oneil two days ago and he did not let me know she was in need of anything.

## 2024-03-28 ENCOUNTER — Encounter
Admission: RE | Disposition: A | Discharge status: Home / Self Care | Payer: Self-pay | Source: Home / Self Care | Attending: Vascular Surgery

## 2024-03-28 ENCOUNTER — Encounter: Payer: Self-pay | Admitting: Vascular Surgery

## 2024-03-28 ENCOUNTER — Other Ambulatory Visit: Payer: Self-pay

## 2024-03-28 ENCOUNTER — Ambulatory Visit
Admission: RE | Admit: 2024-03-28 | Discharge: 2024-03-28 | Disposition: A | Discharge status: Home / Self Care | Attending: Vascular Surgery | Admitting: Vascular Surgery

## 2024-03-28 DIAGNOSIS — N186 End stage renal disease: Secondary | ICD-10-CM | POA: Diagnosis not present

## 2024-03-28 DIAGNOSIS — Z79899 Other long term (current) drug therapy: Secondary | ICD-10-CM | POA: Insufficient documentation

## 2024-03-28 DIAGNOSIS — Z7984 Long term (current) use of oral hypoglycemic drugs: Secondary | ICD-10-CM | POA: Diagnosis not present

## 2024-03-28 DIAGNOSIS — Z992 Dependence on renal dialysis: Secondary | ICD-10-CM

## 2024-03-28 DIAGNOSIS — Y839 Surgical procedure, unspecified as the cause of abnormal reaction of the patient, or of later complication, without mention of misadventure at the time of the procedure: Secondary | ICD-10-CM | POA: Insufficient documentation

## 2024-03-28 DIAGNOSIS — T8249XA Other complication of vascular dialysis catheter, initial encounter: Secondary | ICD-10-CM

## 2024-03-28 DIAGNOSIS — E1122 Type 2 diabetes mellitus with diabetic chronic kidney disease: Secondary | ICD-10-CM | POA: Insufficient documentation

## 2024-03-28 DIAGNOSIS — T8241XA Breakdown (mechanical) of vascular dialysis catheter, initial encounter: Secondary | ICD-10-CM | POA: Insufficient documentation

## 2024-03-28 HISTORY — PX: DIALYSIS/PERMA CATHETER INSERTION: CATH118288

## 2024-03-28 LAB — GLUCOSE, CAPILLARY
Glucose-Capillary: 95 mg/dL (ref 70–99)
Glucose-Capillary: 89 mg/dL (ref 70–99)

## 2024-03-28 LAB — POTASSIUM (ARMC VASCULAR LAB ONLY): Potassium (ARMC vascular lab): 4.8 mmol/L (ref 3.5–5.1)

## 2024-03-28 SURGERY — DIALYSIS/PERMA CATHETER INSERTION
Anesthesia: Moderate Sedation

## 2024-03-28 MED ORDER — HEPARIN (PORCINE) IN NACL 1000-0.9 UT/500ML-% IV SOLN
INTRAVENOUS | Status: DC | PRN
  Administered 2024-03-28: 500 mL

## 2024-03-28 MED ORDER — MIDAZOLAM HCL 2 MG/ML PO SYRP: 8.0000 mg | ORAL_SOLUTION | Freq: Once | ORAL | Status: DC | PRN

## 2024-03-28 MED ORDER — METHYLPREDNISOLONE SODIUM SUCC 125 MG IJ SOLR
INTRAMUSCULAR | Status: DC
Start: 2024-03-28 — End: 2024-03-28
  Filled 2024-03-28: qty 2

## 2024-03-28 MED ORDER — HEPARIN SODIUM (PORCINE) 10000 UNIT/ML IJ SOLN
INTRAMUSCULAR | Status: DC | PRN
  Administered 2024-03-28: 10000 [IU]

## 2024-03-28 MED ORDER — SODIUM CHLORIDE 0.9 % IV SOLN: INTRAVENOUS | Status: DC

## 2024-03-28 MED ORDER — LIDOCAINE-EPINEPHRINE (PF) 1 %-1:200000 IJ SOLN
INTRAMUSCULAR | Status: DC | PRN
  Administered 2024-03-28: 20 mL via INTRADERMAL

## 2024-03-28 MED ORDER — FENTANYL CITRATE (PF) 100 MCG/2ML IJ SOLN
INTRAMUSCULAR | Status: AC
  Filled 2024-03-28: qty 2

## 2024-03-28 MED ORDER — VANCOMYCIN HCL IN DEXTROSE 1-5 GM/200ML-% IV SOLN
1000.0000 mg | INTRAVENOUS | Status: AC
  Administered 2024-03-28: 1000 mg via INTRAVENOUS

## 2024-03-28 MED ORDER — MIDAZOLAM HCL 5 MG/5ML IJ SOLN
INTRAMUSCULAR | Status: AC
  Filled 2024-03-28: qty 5

## 2024-03-28 MED ORDER — FAMOTIDINE 20 MG PO TABS: 40.0000 mg | ORAL_TABLET | Freq: Once | ORAL | Status: DC | PRN

## 2024-03-28 MED ORDER — VANCOMYCIN HCL IN DEXTROSE 1-5 GM/200ML-% IV SOLN
INTRAVENOUS | Status: AC
  Filled 2024-03-28: qty 200

## 2024-03-28 MED ORDER — DIPHENHYDRAMINE HCL 50 MG/ML IJ SOLN
INTRAMUSCULAR | Status: AC
  Filled 2024-03-28: qty 1

## 2024-03-28 MED ORDER — ONDANSETRON HCL 4 MG/2ML IJ SOLN: 4.0000 mg | Freq: Four times a day (QID) | INTRAMUSCULAR | Status: DC | PRN

## 2024-03-28 MED ORDER — DIPHENHYDRAMINE HCL 50 MG/ML IJ SOLN: 50.0000 mg | Freq: Once | INTRAMUSCULAR | Status: DC | PRN

## 2024-03-28 MED ORDER — METHYLPREDNISOLONE SODIUM SUCC 125 MG IJ SOLR: 125.0000 mg | Freq: Once | INTRAMUSCULAR | Status: DC | PRN

## 2024-03-28 MED ORDER — HYDROMORPHONE HCL 1 MG/ML IJ SOLN: 1.0000 mg | Freq: Once | INTRAMUSCULAR | Status: DC | PRN

## 2024-03-28 MED ORDER — FENTANYL CITRATE (PF) 100 MCG/2ML IJ SOLN
INTRAMUSCULAR | Status: DC | PRN
  Administered 2024-03-28: 50 ug via INTRAVENOUS

## 2024-03-28 MED ORDER — MIDAZOLAM HCL 2 MG/2ML IJ SOLN
INTRAMUSCULAR | Status: DC | PRN
  Administered 2024-03-28: 2 mg via INTRAVENOUS

## 2024-03-28 MED ORDER — FAMOTIDINE 20 MG PO TABS
ORAL_TABLET | ORAL | Status: AC
  Filled 2024-03-28: qty 2

## 2024-03-28 SURGICAL SUPPLY — 12 items
BIOPATCH RED 1 DISK 7.0 (GAUZE/BANDAGES/DRESSINGS) IMPLANT
COVER PROBE ULTRASOUND 5X96 (MISCELLANEOUS) IMPLANT
DERMABOND ADVANCED .7 DNX12 (GAUZE/BANDAGES/DRESSINGS) IMPLANT
GOWN STRL REUS W/ TWL LRG LVL3 (GOWN DISPOSABLE) ×1 IMPLANT
KIT CATH CHRNC PALINDROME 14.5 (CATHETERS) IMPLANT
NDL ENTRY 21GA 7CM ECHOTIP (NEEDLE) IMPLANT
NEEDLE ENTRY 21GA 7CM ECHOTIP (NEEDLE) ×1 IMPLANT
PACK ANGIOGRAPHY (CUSTOM PROCEDURE TRAY) ×1 IMPLANT
SET INTRO CAPELLA COAXIAL (SET/KITS/TRAYS/PACK) IMPLANT
SUT MNCRL AB 4-0 PS2 18 (SUTURE) IMPLANT
SUT SILK 0 FSL (SUTURE) IMPLANT
WIRE AMPLATZ SSTIFF .035X260CM (WIRE) IMPLANT

## 2024-03-28 NOTE — Interval H&P Note (Signed)
 History and Physical Interval Note:  03/28/2024 3:49 PM  Amy Wu  has presented today for surgery, with the diagnosis of Perma Cath Exchange   End Stage Renal.  The various methods of treatment have been discussed with the patient and family. After consideration of risks, benefits and other options for treatment, the patient has consented to  Procedure(s): DIALYSIS/PERMA CATHETER INSERTION (N/A) as a surgical intervention.  The patient's history has been reviewed, patient examined, no change in status, stable for surgery.  I have reviewed the patient's chart and labs.  Questions were answered to the patient's satisfaction.     Cordella Shawl

## 2024-03-28 NOTE — Op Note (Signed)
 OPERATIVE NOTE   PROCEDURE: Insertion of tunneled dialysis catheter left femoral approach same venous access.  PRE-OPERATIVE DIAGNOSIS: Nonfunction of existing tunneled dialysis catheter, and stage renal disease requiring hemodialysis   POST-OPERATIVE DIAGNOSIS: Same SURGEON: Cordella Shawl  ANESTHESIA: Conscious sedation was administered under my direct supervision by the interventional radiology RN.  IV Versed  plus fentanyl  were utilized. Continuous ECG, pulse oximetry and blood pressure was monitored throughout the entire procedure.  Conscious sedation was for a total of 20 minutes.  ESTIMATED BLOOD LOSS: Minimal cc  CONTRAST USED:  None  FLUOROSCOPY TIME: 0.3 minutes  INDICATIONS:   Amy Wu is a 73 y.o.y.o. female who presents with poor flow and nonfunction of the tunneled dialysis catheter.  Adequate dialysis has not been possible.  DESCRIPTION: After obtaining full informed written consent, the patient was positioned supine. The left groin was prepped and draped in a sterile fashion. The cuff is localized and using blunt and sharp dissection it is freed from the surrounding adhesions.  The existing catheter is then transected proximal to the cuff.  The guidewire is advanced without difficulty under fluoroscopy.  Dilators are passed over the wire as needed and the tunneled dialysis catheter is fed into the central venous system without difficulty.  Under fluoroscopy the catheter tip positioned at the atrial caval junction.  Both lumens aspirate and flush easily. After verification of smooth contour with proper tip position under fluoroscopy the catheter is packed with 5000 units of heparin  per lumen.  Catheter secured to the skin of the left thigh with 0 silk. A sterile dressing is applied with a Biopatch.  COMPLICATIONS: None  CONDITION: Good  Cordella Shawl [facility] Office:  [phone]   03/28/2024,4:19 PM

## 2024-03-28 NOTE — H&P (View-Only) (Signed)
 MRN : [id number]  Amy Wu is a 73 y.o. (1951/03/17) female who presents with chief complaint of check access.  History of Present Illness:   Patient sent by dialysis center because her catheter is not working  Current Meds  Medication Sig   amiodarone  (PACERONE ) 200 MG tablet Take 1 tablet (200 mg total) by mouth daily.   atorvastatin  (LIPITOR) 40 MG tablet Take 1 tablet (40 mg total) by mouth daily.   cyanocobalamin (VITAMIN B12) 1000 MCG tablet Take 1 tablet (1,000 mcg total) by mouth daily.   fluticasone  (FLONASE ) 50 MCG/ACT nasal spray Place 1 spray into both nostrils daily.   gabapentin  (NEURONTIN ) 100 MG capsule Take 100 mg by mouth 3 (three) times daily as needed. M/W/F after dialysis PRN for leg/thigh cramps   JANUVIA 25 MG tablet Take 25 mg by mouth daily.   loratadine  (CLARITIN ) 10 MG tablet Take 10 mg by mouth daily as needed for allergies.   Multiple Vitamins-Minerals (PRESERVISION AREDS 2) CAPS Take 1 capsule by mouth 2 (two) times daily.   senna-docusate (SENOKOT-S) 8.6-50 MG tablet Take 1-2 tablets by mouth at bedtime as needed for mild constipation or moderate constipation. 1-2 tablets at bedtime if needed   STOOL SOFTENER 100 MG capsule Take 100 mg by mouth 2 (two) times daily.   tiZANidine  (ZANAFLEX ) 4 MG tablet Take 4 mg by mouth 2 (two) times daily as needed for muscle spasms.    Past Medical History:  Diagnosis Date   Arthritis    Atypical atrial flutter (HCC)    Chronic kidney disease    Complication of anesthesia    Diabetes mellitus without complication (HCC)    Hyperlipidemia    NSTEMI (non-ST elevated myocardial infarction) (HCC) 10/2023   Peripheral vascular disease     Past Surgical History:  Procedure Laterality Date   A/V SHUNT INTERVENTION Left 11/20/2019   Procedure: A/V SHUNT INTERVENTION (Left Thigh);  Surgeon: Marea Selinda RAMAN, MD;  Location: [facility];  Service: Cardiovascular;  Laterality: Left;   A/V  SHUNTOGRAM Left 02/16/2017   Procedure: A/V Shuntogram;  Surgeon: Marea Selinda RAMAN, MD;  Location: [facility];  Service: Cardiovascular;  Laterality: Left;   A/V SHUNTOGRAM Left 12/13/2017   Procedure: A/V SHUNTOGRAM;  Surgeon: Marea Selinda RAMAN, MD;  Location: [facility];  Service: Cardiovascular;  Laterality: Left;   A/V SHUNTOGRAM Left 12/05/2018   Procedure: A/V SHUNTOGRAM;  Surgeon: Marea Selinda RAMAN, MD;  Location: [facility];  Service: Cardiovascular;  Laterality: Left;   A/V SHUNTOGRAM Left 06/03/2020   Procedure: A/V SHUNTOGRAM;  Surgeon: Marea Selinda RAMAN, MD;  Location: [facility];  Service: Cardiovascular;  Laterality: Left;   A/V SHUNTOGRAM Left 05/24/2021   Procedure: A/V SHUNTOGRAM;  Surgeon: Jama Cordella KANDICE, MD;  Location: [facility];  Service: Cardiovascular;  Laterality: Left;  Thigh Graft   ABOVE KNEE LEG AMPUTATION     AV FISTULA PLACEMENT Bilateral    AV FISTULA PLACEMENT Left 07/01/2015   Procedure: INSERTION OF ARTERIOVENOUS (AV) GRAFT THIGH (ARTEGRAFT);  Surgeon: Selinda RAMAN Marea, MD;  Location: [facility];  Service: Vascular;  Laterality: Left;   BELOW KNEE LEG AMPUTATION Left    BREAST BIOPSY     CATARACT EXTRACTION W/PHACO Left 07/10/2022   Procedure: CATARACT EXTRACTION PHACO AND INTRAOCULAR LENS PLACEMENT (IOC) LEFT DIABETIC  12.98  01:13.7;  Surgeon: Myrna Adine Anes, MD;  Location: [facility];  Service: Ophthalmology;  Laterality: Left;   DIALYSIS/PERMA CATHETER INSERTION N/A 05/26/2021   Procedure: DIALYSIS/PERMA CATHETER INSERTION;  Surgeon: Marea Selinda RAMAN, MD;  Location: [facility];  Service: Cardiovascular;  Laterality: N/A;   DIALYSIS/PERMA CATHETER INSERTION N/A 07/05/2022   Procedure: DIALYSIS/PERMA CATHETER INSERTION;  Surgeon: Marea Selinda RAMAN, MD;  Location: [facility];  Service: Cardiovascular;  Laterality: N/A;   DIALYSIS/PERMA CATHETER INSERTION N/A 07/28/2022   Procedure: DIALYSIS/PERMA CATHETER INSERTION;   Surgeon: Marea Selinda RAMAN, MD;  Location: [facility];  Service: Cardiovascular;  Laterality: N/A;   DIALYSIS/PERMA CATHETER INSERTION N/A 09/21/2022   Procedure: DIALYSIS/PERMA CATHETER INSERTION;  Surgeon: Marea Selinda RAMAN, MD;  Location: [facility];  Service: Cardiovascular;  Laterality: N/A;   DIALYSIS/PERMA CATHETER INSERTION N/A 02/15/2023   Procedure: DIALYSIS/PERMA CATHETER INSERTION;  Surgeon: Marea Selinda RAMAN, MD;  Location: [facility];  Service: Cardiovascular;  Laterality: N/A;   DIALYSIS/PERMA CATHETER INSERTION N/A 04/09/2023   Procedure: DIALYSIS/PERMA CATHETER INSERTION;  Surgeon: Jama Cordella MATSU, MD;  Location: [facility];  Service: Cardiovascular;  Laterality: N/A;   DIALYSIS/PERMA CATHETER INSERTION N/A 10/05/2023   Procedure: DIALYSIS/PERMA CATHETER INSERTION;  Surgeon: Marea Selinda RAMAN, MD;  Location: [facility];  Service: Cardiovascular;  Laterality: N/A;   DIALYSIS/PERMA CATHETER INSERTION N/A 11/01/2023   Procedure: DIALYSIS/PERMA CATHETER INSERTION;  Surgeon: Marea Selinda RAMAN, MD;  Location: [facility];  Service: Cardiovascular;  Laterality: N/A;   DIALYSIS/PERMA CATHETER REPAIR Right 09/24/2023   Procedure: DIALYSIS/PERMA CATHETER REPAIR;  Surgeon: Jama Cordella MATSU, MD;  Location: [facility];  Service: Cardiovascular;  Laterality: Right;   DIALYSIS/PERMA CATHETER REPAIR Left 01/21/2024   Procedure: DIALYSIS/PERMA CATHETER REPAIR;  Surgeon: Marea Selinda RAMAN, MD;  Location: [facility];  Service: Cardiovascular;  Laterality: Left;   ENDARTERECTOMY FEMORAL Left 07/01/2015   Procedure: SUPERFICIAL FEMORAL ENDARTERECTOMY ;  Surgeon: Selinda RAMAN Marea, MD;  Location: [facility];  Service: Vascular;  Laterality: Left;   EXCHANGE OF A DIALYSIS CATHETER  02/11/2015   Procedure: Exchange Of A Dialysis Catheter;  Surgeon: Selinda RAMAN Marea, MD;  Location: [facility];  Service: Cardiovascular;;   LEFT HEART CATH AND CORONARY ANGIOGRAPHY N/A  10/04/2023   Procedure: LEFT HEART CATH AND CORONARY ANGIOGRAPHY;  Surgeon: Anner Alm ORN, MD;  Location: [facility];  Service: Cardiovascular;  Laterality: N/A;   LEG AMPUTATION ABOVE KNEE Right    PERIPHERAL VASCULAR CATHETERIZATION N/A 02/11/2015   Procedure: Dialysis/Perma Catheter Insertion;  Surgeon: Selinda RAMAN Marea, MD;  Location: [facility];  Service: Cardiovascular;  Laterality: N/A;   PERIPHERAL VASCULAR CATHETERIZATION N/A 05/11/2015   Procedure: Dialysis/Perma Catheter Insertion, exchange;  Surgeon: Cordella MATSU Jama, MD;  Location: [facility];  Service: Cardiovascular;  Laterality: N/A;   PERIPHERAL VASCULAR CATHETERIZATION N/A 05/18/2015   Procedure: Dialysis/Perma Catheter Insertion;  Surgeon: Cordella MATSU Jama, MD;  Location: [facility];  Service: Cardiovascular;  Laterality: N/A;   PERIPHERAL VASCULAR CATHETERIZATION Left 07/19/2015   Procedure: A/V Shuntogram/Fistulagram;  Surgeon: Selinda RAMAN Marea, MD;  Location: [facility];  Service: Cardiovascular;  Laterality: Left;   PERIPHERAL VASCULAR CATHETERIZATION N/A 07/19/2015   Procedure: A/V Shunt Intervention;  Surgeon: Selinda RAMAN Marea, MD;  Location: [facility];  Service: Cardiovascular;  Laterality: N/A;   PERIPHERAL VASCULAR CATHETERIZATION N/A 08/26/2015  Procedure: Dialysis/Perma Catheter Removal;  Surgeon: Selinda GORMAN Gu, MD;  Location: [facility];  Service: Cardiovascular;  Laterality: N/A;   PERIPHERAL VASCULAR THROMBECTOMY N/A 12/29/2016   Procedure: Peripheral Vascular Thrombectomy;  Surgeon: Jama Cordella MATSU, MD;  Location: [facility];  Service: Cardiovascular;  Laterality: N/A;    Social History Social History   Tobacco Use   Smoking status: Never   Smokeless tobacco: Never  Vaping Use   Vaping status: Never Used  Substance Use Topics   Alcohol use: No   Drug use: No    Family History Family History  Problem Relation Age of Onset    Hypertension Mother    Diabetes Mother    Hypertension Father    Diabetes Father    Diabetes Brother    Hypertension Brother    Breast cancer Neg Hx     Allergies  Allergen Reactions   Cefepime  Other (See Comments)    Encephalopathy    Ancef  [Cefazolin ] Palpitations   Contrast Media [Iodinated Contrast Media] Palpitations     REVIEW OF SYSTEMS (Negative unless checked)  Constitutional: [] Weight loss  [] Fever  [] Chills Cardiac: [] Chest pain   [] Chest pressure   [] Palpitations   [] Shortness of breath when laying flat   [] Shortness of breath with exertion. Vascular:  [] Pain in legs with walking   [] Pain in legs at rest  [] History of DVT   [] Phlebitis   [] Swelling in legs   [] Varicose veins   [] Non-healing ulcers Pulmonary:   [] Uses home oxygen   [] Productive cough   [] Hemoptysis   [] Wheeze  [] COPD   [] Asthma Neurologic:  [] Dizziness   [] Seizures   [] History of stroke   [] History of TIA  [] Aphasia   [] Vissual changes   [] Weakness or numbness in arm   [] Weakness or numbness in leg Musculoskeletal:   [] Joint swelling   [] Joint pain   [] Low back pain Hematologic:  [] Easy bruising  [] Easy bleeding   [] Hypercoagulable state   [] Anemic Gastrointestinal:  [] Diarrhea   [] Vomiting  [] Gastroesophageal reflux/heartburn   [] Difficulty swallowing. Genitourinary:  [x] Chronic kidney disease   [] Difficult urination  [] Frequent urination   [] Blood in urine Skin:  [] Rashes   [] Ulcers  Psychological:  [] History of anxiety   []  History of major depression.  Physical Examination  Vitals:   03/28/24 1314  BP: (!) 156/57  Pulse: 62  Resp: 12  Temp: (!) 97.2 F (36.2 C)  TempSrc: Temporal  SpO2: 92%  Weight: 87.3 kg  Height: 4' 10.5 (1.486 m)   Body mass index is 39.54 kg/m. Gen: WD/WN, NAD Head: [place]/AT, No temporalis wasting.  Ear/Nose/Throat: Hearing grossly intact, nares w/o erythema or drainage Eyes: PER, EOMI, sclera nonicteric.  Neck: Supple, no gross masses or lesions.  No JVD.   Pulmonary:  Good air movement, no audible wheezing, no use of accessory muscles.  Cardiac: RRR, precordium non-hyperdynamic. Vascular:   Catheter present Vessel Right Left  Radial Palpable Palpable  Brachial Palpable Palpable  Gastrointestinal: soft, non-distended. No guarding/no peritoneal signs.  Musculoskeletal: M/S 5/5 throughout.  No deformity.  Neurologic: CN 2-12 intact. Pain and light touch intact in extremities.  Symmetrical.  Speech is fluent. Motor exam as listed above. Psychiatric: Judgment intact, Mood & affect appropriate for pt's clinical situation. Dermatologic: No rashes or ulcers noted.  No changes consistent with cellulitis.   CBC Lab Results  Component Value Date   WBC 5.3 03/20/2024   HGB 8.8 (L) 03/20/2024   HCT 27.4 (L) 03/20/2024  MCV 82.5 03/20/2024   PLT 128 (L) 03/20/2024    BMET    Component Value Date/Time   NA 142 03/19/2024 0945   NA 138 12/03/2012 1414   K 4.8 03/19/2024 0945   K 4.5 12/03/2012 1414   CL 103 03/19/2024 0945   CL 101 12/03/2012 1414   CO2 24 03/19/2024 0945   CO2 27 12/03/2012 1414   GLUCOSE 115 (H) 03/19/2024 0945   GLUCOSE 326 (H) 12/03/2012 1414   BUN 44 (H) 03/19/2024 0945   BUN 37 (H) 12/03/2012 1414   CREATININE 5.68 (H) 03/19/2024 0945   CREATININE 7.04 (H) 12/03/2012 1414   CALCIUM  8.9 03/19/2024 0945   CALCIUM  8.4 (L) 12/03/2012 1414   GFRNONAA 7 (L) 03/19/2024 0945   GFRNONAA 6 (L) 12/03/2012 1414   GFRAA 9 (L) 11/19/2019 1002   GFRAA 7 (L) 12/03/2012 1414   Estimated Creatinine Clearance: 8.5 mL/min (A) (by C-G formula based on SCr of 5.68 mg/dL (H)).  COAG Lab Results  Component Value Date   INR 1.2 10/06/2023   INR 1.2 10/03/2023   INR 1.4 (H) 10/01/2023    Radiology No results found.   Assessment/Plan End-stage renal disease: We will exchange the catheter.  Risks and benefits were reviewed all questions answered patient agrees to proceed   Cordella Shawl, MD  03/28/2024 3:47 PM

## 2024-03-28 NOTE — Telephone Encounter (Signed)
 Notified Dr. Maeola Antonio of the following from Dr. Gollan.  Appears there was no source of bleeding on recent hospital admission Given transfusion x 2, B12, hemoglobin improved 8.8 which is her baseline -She could restart Eliquis  5 twice daily -If hemoglobin drops again, would likely need to hold Eliquis  indefinitely At that point would refer to EP for consideration of Watchman device Thx TGollan  Verbalizes understanding.

## 2024-03-28 NOTE — Telephone Encounter (Signed)
 Called and left message for call back.

## 2024-03-28 NOTE — Progress Notes (Signed)
 MRN : [id number]  Amy Wu is a 73 y.o. (1951/03/17) female who presents with chief complaint of check access.  History of Present Illness:   Patient sent by dialysis center because her catheter is not working  Current Meds  Medication Sig   amiodarone  (PACERONE ) 200 MG tablet Take 1 tablet (200 mg total) by mouth daily.   atorvastatin  (LIPITOR) 40 MG tablet Take 1 tablet (40 mg total) by mouth daily.   cyanocobalamin (VITAMIN B12) 1000 MCG tablet Take 1 tablet (1,000 mcg total) by mouth daily.   fluticasone  (FLONASE ) 50 MCG/ACT nasal spray Place 1 spray into both nostrils daily.   gabapentin  (NEURONTIN ) 100 MG capsule Take 100 mg by mouth 3 (three) times daily as needed. M/W/F after dialysis PRN for leg/thigh cramps   JANUVIA 25 MG tablet Take 25 mg by mouth daily.   loratadine  (CLARITIN ) 10 MG tablet Take 10 mg by mouth daily as needed for allergies.   Multiple Vitamins-Minerals (PRESERVISION AREDS 2) CAPS Take 1 capsule by mouth 2 (two) times daily.   senna-docusate (SENOKOT-S) 8.6-50 MG tablet Take 1-2 tablets by mouth at bedtime as needed for mild constipation or moderate constipation. 1-2 tablets at bedtime if needed   STOOL SOFTENER 100 MG capsule Take 100 mg by mouth 2 (two) times daily.   tiZANidine  (ZANAFLEX ) 4 MG tablet Take 4 mg by mouth 2 (two) times daily as needed for muscle spasms.    Past Medical History:  Diagnosis Date   Arthritis    Atypical atrial flutter (HCC)    Chronic kidney disease    Complication of anesthesia    Diabetes mellitus without complication (HCC)    Hyperlipidemia    NSTEMI (non-ST elevated myocardial infarction) (HCC) 10/2023   Peripheral vascular disease     Past Surgical History:  Procedure Laterality Date   A/V SHUNT INTERVENTION Left 11/20/2019   Procedure: A/V SHUNT INTERVENTION (Left Thigh);  Surgeon: Marea Selinda RAMAN, MD;  Location: [facility];  Service: Cardiovascular;  Laterality: Left;   A/V  SHUNTOGRAM Left 02/16/2017   Procedure: A/V Shuntogram;  Surgeon: Marea Selinda RAMAN, MD;  Location: [facility];  Service: Cardiovascular;  Laterality: Left;   A/V SHUNTOGRAM Left 12/13/2017   Procedure: A/V SHUNTOGRAM;  Surgeon: Marea Selinda RAMAN, MD;  Location: [facility];  Service: Cardiovascular;  Laterality: Left;   A/V SHUNTOGRAM Left 12/05/2018   Procedure: A/V SHUNTOGRAM;  Surgeon: Marea Selinda RAMAN, MD;  Location: [facility];  Service: Cardiovascular;  Laterality: Left;   A/V SHUNTOGRAM Left 06/03/2020   Procedure: A/V SHUNTOGRAM;  Surgeon: Marea Selinda RAMAN, MD;  Location: [facility];  Service: Cardiovascular;  Laterality: Left;   A/V SHUNTOGRAM Left 05/24/2021   Procedure: A/V SHUNTOGRAM;  Surgeon: Jama Cordella KANDICE, MD;  Location: [facility];  Service: Cardiovascular;  Laterality: Left;  Thigh Graft   ABOVE KNEE LEG AMPUTATION     AV FISTULA PLACEMENT Bilateral    AV FISTULA PLACEMENT Left 07/01/2015   Procedure: INSERTION OF ARTERIOVENOUS (AV) GRAFT THIGH (ARTEGRAFT);  Surgeon: Selinda RAMAN Marea, MD;  Location: [facility];  Service: Vascular;  Laterality: Left;   BELOW KNEE LEG AMPUTATION Left    BREAST BIOPSY     CATARACT EXTRACTION W/PHACO Left 07/10/2022   Procedure: CATARACT EXTRACTION PHACO AND INTRAOCULAR LENS PLACEMENT (IOC) LEFT DIABETIC  12.98  01:13.7;  Surgeon: Myrna Adine Anes, MD;  Location: [facility];  Service: Ophthalmology;  Laterality: Left;   DIALYSIS/PERMA CATHETER INSERTION N/A 05/26/2021   Procedure: DIALYSIS/PERMA CATHETER INSERTION;  Surgeon: Marea Selinda RAMAN, MD;  Location: [facility];  Service: Cardiovascular;  Laterality: N/A;   DIALYSIS/PERMA CATHETER INSERTION N/A 07/05/2022   Procedure: DIALYSIS/PERMA CATHETER INSERTION;  Surgeon: Marea Selinda RAMAN, MD;  Location: [facility];  Service: Cardiovascular;  Laterality: N/A;   DIALYSIS/PERMA CATHETER INSERTION N/A 07/28/2022   Procedure: DIALYSIS/PERMA CATHETER INSERTION;   Surgeon: Marea Selinda RAMAN, MD;  Location: [facility];  Service: Cardiovascular;  Laterality: N/A;   DIALYSIS/PERMA CATHETER INSERTION N/A 09/21/2022   Procedure: DIALYSIS/PERMA CATHETER INSERTION;  Surgeon: Marea Selinda RAMAN, MD;  Location: [facility];  Service: Cardiovascular;  Laterality: N/A;   DIALYSIS/PERMA CATHETER INSERTION N/A 02/15/2023   Procedure: DIALYSIS/PERMA CATHETER INSERTION;  Surgeon: Marea Selinda RAMAN, MD;  Location: [facility];  Service: Cardiovascular;  Laterality: N/A;   DIALYSIS/PERMA CATHETER INSERTION N/A 04/09/2023   Procedure: DIALYSIS/PERMA CATHETER INSERTION;  Surgeon: Jama Cordella MATSU, MD;  Location: [facility];  Service: Cardiovascular;  Laterality: N/A;   DIALYSIS/PERMA CATHETER INSERTION N/A 10/05/2023   Procedure: DIALYSIS/PERMA CATHETER INSERTION;  Surgeon: Marea Selinda RAMAN, MD;  Location: [facility];  Service: Cardiovascular;  Laterality: N/A;   DIALYSIS/PERMA CATHETER INSERTION N/A 11/01/2023   Procedure: DIALYSIS/PERMA CATHETER INSERTION;  Surgeon: Marea Selinda RAMAN, MD;  Location: [facility];  Service: Cardiovascular;  Laterality: N/A;   DIALYSIS/PERMA CATHETER REPAIR Right 09/24/2023   Procedure: DIALYSIS/PERMA CATHETER REPAIR;  Surgeon: Jama Cordella MATSU, MD;  Location: [facility];  Service: Cardiovascular;  Laterality: Right;   DIALYSIS/PERMA CATHETER REPAIR Left 01/21/2024   Procedure: DIALYSIS/PERMA CATHETER REPAIR;  Surgeon: Marea Selinda RAMAN, MD;  Location: [facility];  Service: Cardiovascular;  Laterality: Left;   ENDARTERECTOMY FEMORAL Left 07/01/2015   Procedure: SUPERFICIAL FEMORAL ENDARTERECTOMY ;  Surgeon: Selinda RAMAN Marea, MD;  Location: [facility];  Service: Vascular;  Laterality: Left;   EXCHANGE OF A DIALYSIS CATHETER  02/11/2015   Procedure: Exchange Of A Dialysis Catheter;  Surgeon: Selinda RAMAN Marea, MD;  Location: [facility];  Service: Cardiovascular;;   LEFT HEART CATH AND CORONARY ANGIOGRAPHY N/A  10/04/2023   Procedure: LEFT HEART CATH AND CORONARY ANGIOGRAPHY;  Surgeon: Anner Alm ORN, MD;  Location: [facility];  Service: Cardiovascular;  Laterality: N/A;   LEG AMPUTATION ABOVE KNEE Right    PERIPHERAL VASCULAR CATHETERIZATION N/A 02/11/2015   Procedure: Dialysis/Perma Catheter Insertion;  Surgeon: Selinda RAMAN Marea, MD;  Location: [facility];  Service: Cardiovascular;  Laterality: N/A;   PERIPHERAL VASCULAR CATHETERIZATION N/A 05/11/2015   Procedure: Dialysis/Perma Catheter Insertion, exchange;  Surgeon: Cordella MATSU Jama, MD;  Location: [facility];  Service: Cardiovascular;  Laterality: N/A;   PERIPHERAL VASCULAR CATHETERIZATION N/A 05/18/2015   Procedure: Dialysis/Perma Catheter Insertion;  Surgeon: Cordella MATSU Jama, MD;  Location: [facility];  Service: Cardiovascular;  Laterality: N/A;   PERIPHERAL VASCULAR CATHETERIZATION Left 07/19/2015   Procedure: A/V Shuntogram/Fistulagram;  Surgeon: Selinda RAMAN Marea, MD;  Location: [facility];  Service: Cardiovascular;  Laterality: Left;   PERIPHERAL VASCULAR CATHETERIZATION N/A 07/19/2015   Procedure: A/V Shunt Intervention;  Surgeon: Selinda RAMAN Marea, MD;  Location: [facility];  Service: Cardiovascular;  Laterality: N/A;   PERIPHERAL VASCULAR CATHETERIZATION N/A 08/26/2015  Procedure: Dialysis/Perma Catheter Removal;  Surgeon: Selinda GORMAN Gu, MD;  Location: [facility];  Service: Cardiovascular;  Laterality: N/A;   PERIPHERAL VASCULAR THROMBECTOMY N/A 12/29/2016   Procedure: Peripheral Vascular Thrombectomy;  Surgeon: Jama Cordella MATSU, MD;  Location: [facility];  Service: Cardiovascular;  Laterality: N/A;    Social History Social History   Tobacco Use   Smoking status: Never   Smokeless tobacco: Never  Vaping Use   Vaping status: Never Used  Substance Use Topics   Alcohol use: No   Drug use: No    Family History Family History  Problem Relation Age of Onset    Hypertension Mother    Diabetes Mother    Hypertension Father    Diabetes Father    Diabetes Brother    Hypertension Brother    Breast cancer Neg Hx     Allergies  Allergen Reactions   Cefepime  Other (See Comments)    Encephalopathy    Ancef  [Cefazolin ] Palpitations   Contrast Media [Iodinated Contrast Media] Palpitations     REVIEW OF SYSTEMS (Negative unless checked)  Constitutional: [] Weight loss  [] Fever  [] Chills Cardiac: [] Chest pain   [] Chest pressure   [] Palpitations   [] Shortness of breath when laying flat   [] Shortness of breath with exertion. Vascular:  [] Pain in legs with walking   [] Pain in legs at rest  [] History of DVT   [] Phlebitis   [] Swelling in legs   [] Varicose veins   [] Non-healing ulcers Pulmonary:   [] Uses home oxygen   [] Productive cough   [] Hemoptysis   [] Wheeze  [] COPD   [] Asthma Neurologic:  [] Dizziness   [] Seizures   [] History of stroke   [] History of TIA  [] Aphasia   [] Vissual changes   [] Weakness or numbness in arm   [] Weakness or numbness in leg Musculoskeletal:   [] Joint swelling   [] Joint pain   [] Low back pain Hematologic:  [] Easy bruising  [] Easy bleeding   [] Hypercoagulable state   [] Anemic Gastrointestinal:  [] Diarrhea   [] Vomiting  [] Gastroesophageal reflux/heartburn   [] Difficulty swallowing. Genitourinary:  [x] Chronic kidney disease   [] Difficult urination  [] Frequent urination   [] Blood in urine Skin:  [] Rashes   [] Ulcers  Psychological:  [] History of anxiety   []  History of major depression.  Physical Examination  Vitals:   03/28/24 1314  BP: (!) 156/57  Pulse: 62  Resp: 12  Temp: (!) 97.2 F (36.2 C)  TempSrc: Temporal  SpO2: 92%  Weight: 87.3 kg  Height: 4' 10.5 (1.486 m)   Body mass index is 39.54 kg/m. Gen: WD/WN, NAD Head: [place]/AT, No temporalis wasting.  Ear/Nose/Throat: Hearing grossly intact, nares w/o erythema or drainage Eyes: PER, EOMI, sclera nonicteric.  Neck: Supple, no gross masses or lesions.  No JVD.   Pulmonary:  Good air movement, no audible wheezing, no use of accessory muscles.  Cardiac: RRR, precordium non-hyperdynamic. Vascular:   Catheter present Vessel Right Left  Radial Palpable Palpable  Brachial Palpable Palpable  Gastrointestinal: soft, non-distended. No guarding/no peritoneal signs.  Musculoskeletal: M/S 5/5 throughout.  No deformity.  Neurologic: CN 2-12 intact. Pain and light touch intact in extremities.  Symmetrical.  Speech is fluent. Motor exam as listed above. Psychiatric: Judgment intact, Mood & affect appropriate for pt's clinical situation. Dermatologic: No rashes or ulcers noted.  No changes consistent with cellulitis.   CBC Lab Results  Component Value Date   WBC 5.3 03/20/2024   HGB 8.8 (L) 03/20/2024   HCT 27.4 (L) 03/20/2024  MCV 82.5 03/20/2024   PLT 128 (L) 03/20/2024    BMET    Component Value Date/Time   NA 142 03/19/2024 0945   NA 138 12/03/2012 1414   K 4.8 03/19/2024 0945   K 4.5 12/03/2012 1414   CL 103 03/19/2024 0945   CL 101 12/03/2012 1414   CO2 24 03/19/2024 0945   CO2 27 12/03/2012 1414   GLUCOSE 115 (H) 03/19/2024 0945   GLUCOSE 326 (H) 12/03/2012 1414   BUN 44 (H) 03/19/2024 0945   BUN 37 (H) 12/03/2012 1414   CREATININE 5.68 (H) 03/19/2024 0945   CREATININE 7.04 (H) 12/03/2012 1414   CALCIUM  8.9 03/19/2024 0945   CALCIUM  8.4 (L) 12/03/2012 1414   GFRNONAA 7 (L) 03/19/2024 0945   GFRNONAA 6 (L) 12/03/2012 1414   GFRAA 9 (L) 11/19/2019 1002   GFRAA 7 (L) 12/03/2012 1414   Estimated Creatinine Clearance: 8.5 mL/min (A) (by C-G formula based on SCr of 5.68 mg/dL (H)).  COAG Lab Results  Component Value Date   INR 1.2 10/06/2023   INR 1.2 10/03/2023   INR 1.4 (H) 10/01/2023    Radiology No results found.   Assessment/Plan End-stage renal disease: We will exchange the catheter.  Risks and benefits were reviewed all questions answered patient agrees to proceed   Cordella Shawl, MD  03/28/2024 3:47 PM

## 2024-03-31 ENCOUNTER — Encounter: Payer: Self-pay | Admitting: Vascular Surgery

## 2024-05-17 ENCOUNTER — Emergency Department
Admission: EM | Admit: 2024-05-17 | Discharge: 2024-05-18 | Disposition: A | Discharge status: Home / Self Care | Attending: Emergency Medicine | Admitting: Emergency Medicine

## 2024-05-17 ENCOUNTER — Other Ambulatory Visit: Payer: Self-pay

## 2024-05-17 ENCOUNTER — Emergency Department

## 2024-05-17 DIAGNOSIS — E1122 Type 2 diabetes mellitus with diabetic chronic kidney disease: Secondary | ICD-10-CM | POA: Insufficient documentation

## 2024-05-17 DIAGNOSIS — R590 Localized enlarged lymph nodes: Secondary | ICD-10-CM | POA: Insufficient documentation

## 2024-05-17 DIAGNOSIS — H6692 Otitis media, unspecified, left ear: Secondary | ICD-10-CM | POA: Insufficient documentation

## 2024-05-17 DIAGNOSIS — Z992 Dependence on renal dialysis: Secondary | ICD-10-CM | POA: Diagnosis not present

## 2024-05-17 DIAGNOSIS — R519 Headache, unspecified: Secondary | ICD-10-CM

## 2024-05-17 DIAGNOSIS — N186 End stage renal disease: Secondary | ICD-10-CM | POA: Insufficient documentation

## 2024-05-17 DIAGNOSIS — I2510 Atherosclerotic heart disease of native coronary artery without angina pectoris: Secondary | ICD-10-CM | POA: Diagnosis not present

## 2024-05-17 MED ORDER — AMOXICILLIN-POT CLAVULANATE 875-125 MG PO TABS: 1.0000 | ORAL_TABLET | Freq: Once | ORAL | Status: DC

## 2024-05-17 MED ORDER — ACETAMINOPHEN 500 MG PO TABS
1000.0000 mg | ORAL_TABLET | Freq: Once | ORAL | Status: AC
  Administered 2024-05-17: 1000 mg via ORAL
  Filled 2024-05-17: qty 2

## 2024-05-17 MED ORDER — AMOXICILLIN-POT CLAVULANATE 875-125 MG PO TABS
0.5000 | ORAL_TABLET | Freq: Once | ORAL | Status: AC
  Administered 2024-05-18: 0.5 via ORAL
  Filled 2024-05-17: qty 1

## 2024-05-17 MED ORDER — AMOXICILLIN-POT CLAVULANATE 875-125 MG PO TABS: 0.5000 | ORAL_TABLET | Freq: Two times a day (BID) | ORAL | 0 refills | Status: AC

## 2024-05-17 NOTE — ED Notes (Signed)
 Famlly leaving at this time, son states he will be available for her when she is discharged.

## 2024-05-17 NOTE — ED Triage Notes (Signed)
 Pt presents via EMS c/o pain to the back of her head with palpation. Reports feels a knot to the back of her head. Denies falling or hitting head. A&O x4.

## 2024-05-17 NOTE — ED Notes (Signed)
 This NT and RN, Katrinka changed pts brief as it was heavy soiled. Also, changed pt chuck pts as well.

## 2024-05-17 NOTE — ED Triage Notes (Signed)
 FIRST NURSE NOTE:  Pt arrived via [facility] with c/o HA that started yesterday worse today, pt reports weakness to L side 24 hours ago, LKW 1930 05/16/24 per EMS  125/57 P73 92% RA HD pt  CBG 202

## 2024-05-17 NOTE — ED Provider Notes (Signed)
 [facility] Provider Note   Event Date/Time   First MD Initiated Contact with Patient 05/17/24 2323     (approximate) History  Headache  HPI Amy Wu is a 73 y.o. female with a stated past medical history of end-stage renal disease on dialysis, CAD, hyperlipidemia, and type 2 diabetes who presents complaining of sore throat, left-sided headache, left ear pain, and left neck pain that has been present over the last 24 hours.  Patient states that she is having a throbbing headache as well as the feeling of a small mass to the left posterior scalp.  Patient also endorses feeling generalized weakness however denies any unilateral weakness/numbness/paresthesia.   ROS: Patient currently denies any vision changes, tinnitus, difficulty speaking, facial droop, sore throat, chest pain, shortness of breath, abdominal pain, nausea/vomiting/diarrhea, dysuria   Physical Exam  Triage Vital Signs: ED Triage Vitals  Encounter Vitals Group     BP 05/17/24 2202 (!) 129/55     Girls Systolic BP Percentile --      Girls Diastolic BP Percentile --      Boys Systolic BP Percentile --      Boys Diastolic BP Percentile --      Pulse Rate 05/17/24 2202 70     Resp 05/17/24 2202 16     Temp 05/17/24 2202 98.1 F (36.7 C)     Temp Source 05/17/24 2202 Oral     SpO2 05/17/24 2202 92 %     Weight --      Height --      Head Circumference --      Peak Flow --      Pain Score 05/17/24 2203 10     Pain Loc --      Pain Education --      Exclude from Growth Chart --    Most recent vital signs: Vitals:   05/17/24 2202  BP: (!) 129/55  Pulse: 70  Resp: 16  Temp: 98.1 F (36.7 C)  SpO2: 92%   General: Awake, oriented x4. CV:  Good peripheral perfusion. Resp:  Normal effort. Abd:  No distention. Other:  Elderly obese African-American female resting comfortably in no acute distress.  Left-sided anterior cervical lymphadenopathy.  Left tympanic membrane erythematous, bulging,  and with air-fluid level of white material behind the membrane ED Results / Procedures / Treatments  Labs (all labs ordered are listed, but only abnormal results are displayed) Labs Reviewed - No data to display RADIOLOGY ED MD interpretation: CT of the head without contrast interpreted by me shows no evidence of acute abnormalities including no intracerebral hemorrhage, obvious masses, or significant edema - All radiology independently interpreted and agree with radiology assessment Official radiology report(s): CT Head Wo Contrast Result Date: 05/17/2024 EXAM: CT HEAD WITHOUT CONTRAST 05/17/2024 10:25:34 PM TECHNIQUE: CT of the head was performed without the administration of intravenous contrast. Automated exposure control, iterative reconstruction, and/or weight based adjustment of the mA/kV was utilized to reduce the radiation dose to as low as reasonably achievable. COMPARISON: Comparison with 10/09/2023. CLINICAL HISTORY: headache FINDINGS: BRAIN AND VENTRICLES: No acute hemorrhage. No evidence of acute infarct. 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 No hydrocephalus. No extra-axial collection. No mass effect or midline shift. ORBITS: No acute abnormality. SINUSES: No acute abnormality. SOFT TISSUES AND SKULL: No acute soft tissue abnormality. No skull fracture. IMPRESSION: 1. No acute intracranial abnormality. Electronically signed by: Norman Gatlin MD 05/17/2024 10:32 PM EST RP Workstation: [id number]   PROCEDURES: Critical  Care performed: No Procedures MEDICATIONS ORDERED IN ED: Medications  amoxicillin-clavulanate (AUGMENTIN) 875-125 MG per tablet 0.5 tablet (has no administration in time range)  acetaminophen  (TYLENOL ) tablet 1,000 mg (1,000 mg Oral Given 05/17/24 2206)   IMPRESSION / MDM / ASSESSMENT AND PLAN / ED COURSE  I reviewed the triage vital signs and the nursing notes.                             The patient is on the cardiac monitor to evaluate for  evidence of arrhythmia and/or significant heart rate changes. Patient's presentation is most consistent with acute presentation with potential threat to life or bodily function. Patient is a 73 year old female with the above-stated past medical history who presents complaining of a headache, sore throat, left ear pain, left neck pain over the last 24 hours. DDx: Migraine, skull fracture, intracranial hemorrhage, ear infection, upper respiratory infection Plan: CT head  Radiologic evaluation shows no evidence of skull fracture, intracranial hemorrhage, or other intracranial abnormalities.  Patient's physical exam does show evidence of acute otitis media with anterior cervical lymphadenopathy.  The mass the patient is feeling in the back of her skull is possibly from lymphatic drainage as well however nothing was appreciated on my exam.  Patient encouraged to continue antibiotics as well as over-the-counter analgesia with expected resolution.  Patient agrees with plan for discharge at this time with outpatient PCP follow-up as needed.  Patient given strict return precautions and all questions answered prior to discharge  Dispo: Discharge home with PCP follow-up   FINAL CLINICAL IMPRESSION(S) / ED DIAGNOSES   Final diagnoses:  Acute nonintractable headache, unspecified headache type  Left otitis media, unspecified otitis media type   Rx / DC Orders   ED Discharge Orders          Ordered    amoxicillin-clavulanate (AUGMENTIN) 875-125 MG tablet  2 times daily        05/17/24 2332           Note:  This document was prepared using Dragon voice recognition software and may include unintentional dictation errors.   Jossie Artist POUR, MD 05/17/24 [phone]
